# Patient Record
Sex: Female | Born: 1995 | Race: White | Hispanic: No | Marital: Single | Smoking: Never smoker
Health system: Southern US, Community
[De-identification: ages and names within clinical notes are randomized; demographics above are authoritative.]

## PROBLEM LIST (undated history)

## (undated) ENCOUNTER — Inpatient Hospital Stay: Payer: Self-pay

## (undated) DIAGNOSIS — D649 Anemia, unspecified: Secondary | ICD-10-CM

## (undated) DIAGNOSIS — O139 Gestational [pregnancy-induced] hypertension without significant proteinuria, unspecified trimester: Secondary | ICD-10-CM

## (undated) DIAGNOSIS — G43109 Migraine with aura, not intractable, without status migrainosus: Secondary | ICD-10-CM

## (undated) DIAGNOSIS — Z8742 Personal history of other diseases of the female genital tract: Secondary | ICD-10-CM

## (undated) DIAGNOSIS — F329 Major depressive disorder, single episode, unspecified: Secondary | ICD-10-CM

## (undated) DIAGNOSIS — D72829 Elevated white blood cell count, unspecified: Secondary | ICD-10-CM

## (undated) DIAGNOSIS — Z8489 Family history of other specified conditions: Secondary | ICD-10-CM

## (undated) DIAGNOSIS — F1210 Cannabis abuse, uncomplicated: Secondary | ICD-10-CM

## (undated) DIAGNOSIS — F32A Depression, unspecified: Secondary | ICD-10-CM

## (undated) DIAGNOSIS — L709 Acne, unspecified: Secondary | ICD-10-CM

## (undated) DIAGNOSIS — F909 Attention-deficit hyperactivity disorder, unspecified type: Secondary | ICD-10-CM

## (undated) DIAGNOSIS — G8929 Other chronic pain: Secondary | ICD-10-CM

## (undated) DIAGNOSIS — K219 Gastro-esophageal reflux disease without esophagitis: Secondary | ICD-10-CM

## (undated) DIAGNOSIS — R0600 Dyspnea, unspecified: Secondary | ICD-10-CM

## (undated) DIAGNOSIS — K589 Irritable bowel syndrome without diarrhea: Secondary | ICD-10-CM

## (undated) DIAGNOSIS — M549 Dorsalgia, unspecified: Secondary | ICD-10-CM

## (undated) DIAGNOSIS — F419 Anxiety disorder, unspecified: Secondary | ICD-10-CM

## (undated) DIAGNOSIS — R001 Bradycardia, unspecified: Secondary | ICD-10-CM

## (undated) HISTORY — PX: CHOLECYSTECTOMY: SHX55

## (undated) HISTORY — DX: Acne, unspecified: L70.9

## (undated) HISTORY — DX: Migraine with aura, not intractable, without status migrainosus: G43.109

## (undated) HISTORY — DX: Attention-deficit hyperactivity disorder, unspecified type: F90.9

## (undated) HISTORY — DX: Gastro-esophageal reflux disease without esophagitis: K21.9

## (undated) HISTORY — PX: INSERTION OF NON VAGINAL CONTRACEPTIVE DEVICE: SHX6253

## (undated) HISTORY — DX: Dorsalgia, unspecified: M54.9

## (undated) HISTORY — DX: Other chronic pain: G89.29

---
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
## 2015-03-15 ENCOUNTER — Encounter: Payer: Self-pay | Admitting: Family Medicine

## 2015-03-15 ENCOUNTER — Ambulatory Visit (INDEPENDENT_AMBULATORY_CARE_PROVIDER_SITE_OTHER): Payer: Medicaid Other | Admitting: Family Medicine

## 2015-03-15 VITALS — BP 124/72 | HR 110 | Temp 99.3°F | Resp 16 | Ht 63.75 in | Wt 135.5 lb

## 2015-03-15 DIAGNOSIS — G8929 Other chronic pain: Secondary | ICD-10-CM

## 2015-03-15 DIAGNOSIS — M545 Low back pain: Secondary | ICD-10-CM

## 2015-03-15 DIAGNOSIS — Z2821 Immunization not carried out because of patient refusal: Secondary | ICD-10-CM | POA: Diagnosis not present

## 2015-03-15 DIAGNOSIS — Z309 Encounter for contraceptive management, unspecified: Secondary | ICD-10-CM | POA: Diagnosis not present

## 2015-03-15 DIAGNOSIS — Z3046 Encounter for surveillance of implantable subdermal contraceptive: Secondary | ICD-10-CM

## 2015-03-16 DIAGNOSIS — G8929 Other chronic pain: Secondary | ICD-10-CM | POA: Insufficient documentation

## 2015-03-16 DIAGNOSIS — Z3046 Encounter for surveillance of implantable subdermal contraceptive: Secondary | ICD-10-CM | POA: Insufficient documentation

## 2015-03-16 DIAGNOSIS — Z2821 Immunization not carried out because of patient refusal: Secondary | ICD-10-CM | POA: Insufficient documentation

## 2015-03-16 DIAGNOSIS — M545 Low back pain: Principal | ICD-10-CM

## 2015-03-16 NOTE — Progress Notes (Signed)
Name: Marabella Popiel   MRN: [id number]    DOB: [birth year]   Date:03/16/2015       Progress Note  Subjective  Chief Complaint  Chief Complaint  Patient presents with  . Establish Care    patient has been having some issues sleeping.  . Knee Pain    patient wants to get a rx for Meloxicam that she was taking for chronic right knee pain (popped out of place right before her 7th grade year)    HPI  Jalayiah Bibian is a 19 year old female here today to establish care. She has finished 10 grade and stopped after that. She tried finishing requirements at [employer] but was not able to complete this.  She now plans to start working and knows that being on her feet all day will aggravate her back and knee pain. Back pain is usual between shoulder blades and lower back. No previous injury. Mobic has worked well in the past. For contraception she has a implanted device in left upper arm which is working well for her.   Patient Active Problem List   Diagnosis Date Noted  . Chronic lumbar pain 03/16/2015  . Implantable subdermal contraceptive surveillance 03/16/2015    Social History  Substance Use Topics  . Smoking status: Never Smoker   . Smokeless tobacco: Not on file  . Alcohol Use: No     Current outpatient prescriptions:  .  meloxicam (MOBIC) 7.5 MG tablet, Take 7.5 mg by mouth daily., Disp: , Rfl:   Past Surgical History  Procedure Laterality Date  . Insertion of non vaginal contraceptive device      History reviewed. No pertinent family history.  No Known Allergies   Review of Systems  CONSTITUTIONAL: No significant weight changes, fever, chills, weakness or fatigue.  HEENT:  - Eyes: No visual changes.  - Ears: No auditory changes. No pain.  - Nose: No sneezing, congestion, runny nose. - Throat: No sore throat. No changes in swallowing. SKIN: No rash or itching.  CARDIOVASCULAR: No chest pain, chest pressure or chest discomfort. No palpitations or edema.  RESPIRATORY:  No shortness of breath, cough or sputum.  GASTROINTESTINAL: No anorexia, nausea, vomiting. No changes in bowel habits. No abdominal pain or blood.  GENITOURINARY: No dysuria. No frequency. No discharge. NEUROLOGICAL: No headache, dizziness, syncope, paralysis, ataxia, numbness or tingling in the extremities. No memory changes. No change in bowel or bladder control.  MUSCULOSKELETAL: Yes joint pain. No muscle pain. HEMATOLOGIC: No anemia, bleeding or bruising.  LYMPHATICS: No enlarged lymph nodes.  PSYCHIATRIC: No change in mood. No change in sleep pattern.  ENDOCRINOLOGIC: No reports of sweating, cold or heat intolerance. No polyuria or polydipsia.     Objective  BP 124/72 mmHg  Pulse 110  Temp(Src) 99.3 F (37.4 C) (Oral)  Resp 16  Ht 5' 3.75" (1.619 m)  Wt 135 lb 8 oz (61.462 kg)  BMI 23.45 kg/m2  SpO2 97% Body mass index is 23.45 kg/(m^2).  Physical Exam  Constitutional: Patient appears well-developed and well-nourished. In no distress.  HEENT:  - Head: Normocephalic and atraumatic.  - Ears: Bilateral TMs gray, no erythema or effusion - Nose: Nasal mucosa moist - Mouth/Throat: Oropharynx is clear and moist. No tonsillar hypertrophy or erythema. No post nasal drainage.  - Eyes: Conjunctivae clear, EOM movements normal. PERRLA. No scleral icterus.  Neck: Normal range of motion. Neck supple. No JVD present. No thyromegaly present.  Cardiovascular: Normal rate, regular rhythm and normal heart sounds.  No murmur heard.  Pulmonary/Chest: Effort normal and breath sounds normal. No respiratory distress. Musculoskeletal: Normal range of motion bilateral UE and LE, no joint effusions. Cervical, thoracic and lumbar spine normal curvature with no palpable step off or point tenderness. Peripheral vascular: Bilateral LE no edema. Neurological: CN II-XII grossly intact with no focal deficits. Alert and oriented to person, place, and time. Coordination, balance, strength, speech and gait  are normal.  Skin: Skin is warm and dry. No rash noted. No erythema.  Psychiatric: Patient has a normal mood and affect. Behavior is normal in office today. Judgment and thought content normal in office today.   Assessment & Plan  1. Chronic lumbar pain We discussed potential pathology and long term risk of reoccurrence. Maintaining an ideal body habitus, regular exercise, proper lifting techniques and mindfulness of exacerbating factors will be useful in long term management.  Instructed on use of heating pad with exercises. Consider concomitant therapy with PT, massage therapist or chiropractor. May use anti-inflammatory medication and muscle relaxer as needed.   2. Implantable subdermal contraceptive surveillance   3. Influenza vaccination declined by patient

## 2015-03-25 ENCOUNTER — Emergency Department
Admission: EM | Admit: 2015-03-25 | Discharge: 2015-03-25 | Disposition: A | Discharge status: Home / Self Care | Payer: Medicaid Other | Attending: Emergency Medicine | Admitting: Emergency Medicine

## 2015-03-25 ENCOUNTER — Emergency Department: Payer: Medicaid Other

## 2015-03-25 ENCOUNTER — Encounter: Payer: Self-pay | Admitting: Emergency Medicine

## 2015-03-25 DIAGNOSIS — N10 Acute tubulo-interstitial nephritis: Secondary | ICD-10-CM | POA: Diagnosis not present

## 2015-03-25 DIAGNOSIS — R112 Nausea with vomiting, unspecified: Secondary | ICD-10-CM | POA: Diagnosis present

## 2015-03-25 DIAGNOSIS — R197 Diarrhea, unspecified: Secondary | ICD-10-CM | POA: Diagnosis not present

## 2015-03-25 DIAGNOSIS — R1084 Generalized abdominal pain: Secondary | ICD-10-CM | POA: Diagnosis not present

## 2015-03-25 DIAGNOSIS — R05 Cough: Secondary | ICD-10-CM | POA: Diagnosis not present

## 2015-03-25 DIAGNOSIS — Z3202 Encounter for pregnancy test, result negative: Secondary | ICD-10-CM | POA: Diagnosis not present

## 2015-03-25 DIAGNOSIS — N12 Tubulo-interstitial nephritis, not specified as acute or chronic: Secondary | ICD-10-CM

## 2015-03-25 LAB — URINALYSIS COMPLETE WITH MICROSCOPIC (ARMC ONLY)
Bilirubin Urine: NEGATIVE
Glucose, UA: NEGATIVE mg/dL
Hgb urine dipstick: NEGATIVE
Ketones, ur: NEGATIVE mg/dL
Leukocytes, UA: NEGATIVE
Nitrite: NEGATIVE
PH: 5 (ref 5.0–8.0)
PROTEIN: 30 mg/dL — AB
Specific Gravity, Urine: 1.02 (ref 1.005–1.030)

## 2015-03-25 LAB — CBC WITH DIFFERENTIAL/PLATELET
BASOS ABS: 0 10*3/uL (ref 0–0.1)
BASOS PCT: 0 %
Eosinophils Absolute: 0 10*3/uL (ref 0–0.7)
Eosinophils Relative: 0 %
HEMATOCRIT: 40.3 % (ref 35.0–47.0)
HEMOGLOBIN: 13.4 g/dL (ref 12.0–16.0)
LYMPHS PCT: 5 %
Lymphs Abs: 0.9 10*3/uL — ABNORMAL LOW (ref 1.0–3.6)
MCH: 27.5 pg (ref 26.0–34.0)
MCHC: 33.3 g/dL (ref 32.0–36.0)
MCV: 82.3 fL (ref 80.0–100.0)
MONO ABS: 0.6 10*3/uL (ref 0.2–0.9)
Monocytes Relative: 4 %
NEUTROS ABS: 16.9 10*3/uL — AB (ref 1.4–6.5)
NEUTROS PCT: 91 %
Platelets: 263 10*3/uL (ref 150–440)
RBC: 4.89 MIL/uL (ref 3.80–5.20)
RDW: 12.9 % (ref 11.5–14.5)
WBC: 18.5 10*3/uL — AB (ref 3.6–11.0)

## 2015-03-25 LAB — COMPREHENSIVE METABOLIC PANEL
ALBUMIN: 4.6 g/dL (ref 3.5–5.0)
ALT: 28 U/L (ref 14–54)
AST: 34 U/L (ref 15–41)
Alkaline Phosphatase: 80 U/L (ref 38–126)
Anion gap: 9 (ref 5–15)
BILIRUBIN TOTAL: 0.5 mg/dL (ref 0.3–1.2)
BUN: 10 mg/dL (ref 6–20)
CO2: 25 mmol/L (ref 22–32)
CREATININE: 0.7 mg/dL (ref 0.44–1.00)
Calcium: 9 mg/dL (ref 8.9–10.3)
Chloride: 109 mmol/L (ref 101–111)
GFR calc Af Amer: >60 mL/min (ref >60)
GFR calc non Af Amer: >60 mL/min (ref >60)
GLUCOSE: 123 mg/dL — AB (ref 65–99)
POTASSIUM: 3.7 mmol/L (ref 3.5–5.1)
Sodium: 143 mmol/L (ref 135–145)
TOTAL PROTEIN: 7.9 g/dL (ref 6.5–8.1)

## 2015-03-25 LAB — LIPASE, BLOOD: Lipase: 14 U/L — ABNORMAL LOW (ref 22–51)

## 2015-03-25 MED ORDER — ONDANSETRON HCL 4 MG PO TABS: 4.0000 mg | ORAL_TABLET | Freq: Every day | ORAL | Status: DC | PRN

## 2015-03-25 MED ORDER — MORPHINE SULFATE (PF) 4 MG/ML IV SOLN
4.0000 mg | Freq: Once | INTRAVENOUS | Status: AC
  Administered 2015-03-25: 4 mg via INTRAVENOUS
  Filled 2015-03-25: qty 1

## 2015-03-25 MED ORDER — SODIUM CHLORIDE 0.9 % IV BOLUS (SEPSIS)
1000.0000 mL | Freq: Once | INTRAVENOUS | Status: AC
Start: 2015-03-25 — End: 2015-03-25
  Administered 2015-03-25: 1000 mL via INTRAVENOUS

## 2015-03-25 MED ORDER — IOHEXOL 240 MG/ML SOLN
25.0000 mL | Freq: Once | INTRAMUSCULAR | Status: AC | PRN
  Administered 2015-03-25: 25 mL via ORAL

## 2015-03-25 MED ORDER — IOHEXOL 300 MG/ML  SOLN
100.0000 mL | Freq: Once | INTRAMUSCULAR | Status: AC | PRN
  Administered 2015-03-25: 100 mL via INTRAVENOUS

## 2015-03-25 MED ORDER — ONDANSETRON HCL 4 MG/2ML IJ SOLN
4.0000 mg | Freq: Once | INTRAMUSCULAR | Status: AC
  Administered 2015-03-25: 4 mg via INTRAVENOUS
  Filled 2015-03-25: qty 2

## 2015-03-25 MED ORDER — PANTOPRAZOLE SODIUM 40 MG IV SOLR
40.0000 mg | Freq: Once | INTRAVENOUS | Status: AC
  Administered 2015-03-25: 40 mg via INTRAVENOUS
  Filled 2015-03-25: qty 40

## 2015-03-25 MED ORDER — DICYCLOMINE HCL 20 MG PO TABS: 20.0000 mg | ORAL_TABLET | Freq: Three times a day (TID) | ORAL | Status: DC | PRN

## 2015-03-25 MED ORDER — CEPHALEXIN 500 MG PO CAPS
500.0000 mg | ORAL_CAPSULE | Freq: Three times a day (TID) | ORAL | Status: DC
Start: 2015-03-25 — End: 2015-03-30

## 2015-03-25 MED ORDER — DICYCLOMINE HCL 10 MG PO CAPS
20.0000 mg | ORAL_CAPSULE | Freq: Once | ORAL | Status: AC
  Administered 2015-03-25: 20 mg via ORAL
  Filled 2015-03-25: qty 2

## 2015-03-25 MED ORDER — CEPHALEXIN 500 MG PO CAPS
500.0000 mg | ORAL_CAPSULE | Freq: Once | ORAL | Status: AC
  Administered 2015-03-25: 500 mg via ORAL
  Filled 2015-03-25: qty 1

## 2015-03-25 NOTE — ED Notes (Signed)
Reports waking up at 5am with vomiting.

## 2015-03-25 NOTE — ED Provider Notes (Signed)
[facility] Emergency Department Provider Note  ____________________________________________  Time seen: Approximately 11 AM  I have reviewed the triage vital signs and the nursing notes.   HISTORY  Chief Complaint Emesis    HPI Tiffany Guzman is a 19 y.o. female who is presenting today with abdominal pain with nausea vomiting and diarrhea since 5 AM. She says that she recently had a cough with nasal congestion that she thinks she caught from family member. However, this morning at 5 AM she began having upper abdominal cramping which progressed to multiple episodes of vomiting. She thinks that she vomited over 20 times which eventually progressed to blood streaks in her vomit. She is not having upper abdominal cramping which is intermittent and severe. Also complaining of diarrhea without blood in her stool. She says the diarrhea has been going on for one month.Denies any recent antibiotics or travel. Says has a history of urinary tract infections but this does not feel like her UTIs. Denies any vaginal bleeding or discharge. No dysuria.   Past Medical History  Diagnosis Date  . Chronic upper back pain   . Attention deficit hyperactivity disorder (ADHD)     Patient Active Problem List   Diagnosis Date Noted  . Chronic lumbar pain 03/16/2015  . Implantable subdermal contraceptive surveillance 03/16/2015  . Influenza vaccination declined by patient 03/16/2015    Past Surgical History  Procedure Laterality Date  . Insertion of non vaginal contraceptive device      Current Outpatient Rx  Name  Route  Sig  Dispense  Refill  . meloxicam (MOBIC) 7.5 MG tablet   Oral   Take 7.5 mg by mouth daily as needed (for inflammation.).            Allergies Review of patient's allergies indicates no known allergies.  History reviewed. No pertinent family history.  Social History Social History  Substance Use Topics  . Smoking status: Never Smoker   .  Smokeless tobacco: None  . Alcohol Use: No    Review of Systems Constitutional: No fever/chills Eyes: No visual changes. ENT: No sore throat. Cardiovascular: Denies chest pain. Respiratory: Denies shortness of breath. Gastrointestinal: No constipation. Genitourinary: Negative for dysuria. Musculoskeletal: Negative for back pain. Skin: Negative for rash. Neurological: Negative for headaches, focal weakness or numbness.  10-point ROS otherwise negative.  ____________________________________________   PHYSICAL EXAM:  VITAL SIGNS: ED Triage Vitals  Enc Vitals Group     BP 03/25/15 1035 124/78 mmHg     Pulse Rate 03/25/15 1035 93     Resp 03/25/15 1035 18     Temp 03/25/15 1035 97.5 F (36.4 C)     Temp Source 03/25/15 1035 Oral     SpO2 03/25/15 1035 100 %     Weight 03/25/15 1035 138 lb (62.596 kg)     Height 03/25/15 1035 5\' 4"  (1.626 m)     Head Cir --      Peak Flow --      Pain Score 03/25/15 1039 10     Pain Loc --      Pain Edu? --      Excl. in [place]? --     Constitutional: Alert and oriented. Well appearing and in no acute distress. Eyes: Conjunctivae are normal. PERRL. EOMI. Head: Atraumatic. Nose: No congestion/rhinnorhea. Mouth/Throat: Mucous membranes are moist.  Oropharynx non-erythematous. Neck: No stridor.   Cardiovascular: Normal rate, regular rhythm. Grossly normal heart sounds.  Good peripheral circulation. Respiratory: Normal respiratory effort.  No retractions. Lungs CTAB. Gastrointestinal: Soft with tenderness to the upper abdomen as well as suprapubic. There is no rebound or guarding. There is a negative Murphy sign. There is no tenderness over McBurney's point.. No distention. No abdominal bruits. Bilateral CVA tenderness. Musculoskeletal: No lower extremity tenderness nor edema.  No joint effusions. Neurologic:  Normal speech and language. No gross focal neurologic deficits are appreciated. No gait instability. Skin:  Skin is warm, dry and  intact. No rash noted. Psychiatric: Mood and affect are normal. Speech and behavior are normal.  ____________________________________________   LABS (all labs ordered are listed, but only abnormal results are displayed)  Labs Reviewed  CBC WITH DIFFERENTIAL/PLATELET - Abnormal; Notable for the following:    WBC 18.5 (*)    Neutro Abs 16.9 (*)    Lymphs Abs 0.9 (*)    All other components within normal limits  COMPREHENSIVE METABOLIC PANEL - Abnormal; Notable for the following:    Glucose, Bld 123 (*)    All other components within normal limits  URINALYSIS COMPLETEWITH MICROSCOPIC ([facility] ONLY) - Abnormal; Notable for the following:    Color, Urine YELLOW (*)    APPearance TURBID (*)    Protein, ur 30 (*)    Bacteria, UA FEW (*)    Squamous Epithelial / LPF 6-30 (*)    All other components within normal limits  LIPASE, BLOOD - Abnormal; Notable for the following:    Lipase 14 (*)    All other components within normal limits  URINE CULTURE  POC URINE PREG, ED   ____________________________________________  EKG   ____________________________________________  RADIOLOGY  No acute finding on the CAT scan of the abdomen and pelvis. ____________________________________________   PROCEDURES   ____________________________________________   INITIAL IMPRESSION / ASSESSMENT AND PLAN / ED COURSE  Pertinent labs & imaging results that were available during my care of the patient were reviewed by me and considered in my medical decision making (see chart for details).  ----------------------------------------- 2:43 PM on 03/25/2015 -----------------------------------------  Patient reassessed and is feeling much better at this time. Pain is reduced after medication and patient tolerated by mouth contrast. Patient with urinary tract infection and has had UTIs in the past. Patient is not concerned about sexually transmitted diseases. No history of sexually transmitted  diseases. Discussed pelvic exam and patient would not like this at this time. We will prescribe antibiotics and follow-up with primary care. ____________________________________________   FINAL CLINICAL IMPRESSION(S) / ED DIAGNOSES  Acute pyelonephritis. Initial visit.    Orbie Pyo, MD 03/25/15 1500

## 2015-03-25 NOTE — Discharge Instructions (Signed)
Abdominal Pain, Women °Abdominal (stomach, pelvic, or belly) pain can be caused by many things. It is important to tell your doctor: °· The location of the pain. °· Does it come and go or is it present all the time? °· Are there things that start the pain (eating certain foods, exercise)? °· Are there other symptoms associated with the pain (fever, nausea, vomiting, diarrhea)? °All of this is helpful to know when trying to find the cause of the pain. °CAUSES  °· Stomach: virus or bacteria infection, or ulcer. °· Intestine: appendicitis (inflamed appendix), regional ileitis (Crohn's disease), ulcerative colitis (inflamed colon), irritable bowel syndrome, diverticulitis (inflamed diverticulum of the colon), or cancer of the stomach or intestine. °· Gallbladder disease or stones in the gallbladder. °· Kidney disease, kidney stones, or infection. °· Pancreas infection or cancer. °· Fibromyalgia (pain disorder). °· Diseases of the female organs: °¨ Uterus: fibroid (non-cancerous) tumors or infection. °¨ Fallopian tubes: infection or tubal pregnancy. °¨ Ovary: cysts or tumors. °¨ Pelvic adhesions (scar tissue). °¨ Endometriosis (uterus lining tissue growing in the pelvis and on the pelvic organs). °¨ Pelvic congestion syndrome (female organs filling up with blood just before the menstrual period). °¨ Pain with the menstrual period. °¨ Pain with ovulation (producing an egg). °¨ Pain with an IUD (intrauterine device, birth control) in the uterus. °¨ Cancer of the female organs. °· Functional pain (pain not caused by a disease, may improve without treatment). °· Psychological pain. °· Depression. °DIAGNOSIS  °Your doctor will decide the seriousness of your pain by doing an examination. °· Blood tests. °· X-rays. °· Ultrasound. °· CT scan (computed tomography, special type of X-ray). °· MRI (magnetic resonance imaging). °· Cultures, for infection. °· Barium enema (dye inserted in the large intestine, to better view it with  X-rays). °· Colonoscopy (looking in intestine with a lighted tube). °· Laparoscopy (minor surgery, looking in abdomen with a lighted tube). °· Major abdominal exploratory surgery (looking in abdomen with a large incision). °TREATMENT  °The treatment will depend on the cause of the pain.  °· Many cases can be observed and treated at home. °· Over-the-counter medicines recommended by your caregiver. °· Prescription medicine. °· Antibiotics, for infection. °· Birth control pills, for painful periods or for ovulation pain. °· Hormone treatment, for endometriosis. °· Nerve blocking injections. °· Physical therapy. °· Antidepressants. °· Counseling with a psychologist or psychiatrist. °· Minor or major surgery. °HOME CARE INSTRUCTIONS  °· Do not take laxatives, unless directed by your caregiver. °· Take over-the-counter pain medicine only if ordered by your caregiver. Do not take aspirin because it can cause an upset stomach or bleeding. °· Try a clear liquid diet (broth or water) as ordered by your caregiver. Slowly move to a bland diet, as tolerated, if the pain is related to the stomach or intestine. °· Have a thermometer and take your temperature several times a day, and record it. °· Bed rest and sleep, if it helps the pain. °· Avoid sexual intercourse, if it causes pain. °· Avoid stressful situations. °· Keep your follow-up appointments and tests, as your caregiver orders. °· If the pain does not go away with medicine or surgery, you may try: °¨ Acupuncture. °¨ Relaxation exercises (yoga, meditation). °¨ Group therapy. °¨ Counseling. °SEEK MEDICAL CARE IF:  °· You notice certain foods cause stomach pain. °· Your home care treatment is not helping your pain. °· You need stronger pain medicine. °· You want your IUD removed. °· You feel faint or   lightheaded. °· You develop nausea and vomiting. °· You develop a rash. °· You are having side effects or an allergy to your medicine. °SEEK IMMEDIATE MEDICAL CARE IF:  °· Your  pain does not go away or gets worse. °· You have a fever. °· Your pain is felt only in portions of the abdomen. The right side could possibly be appendicitis. The left lower portion of the abdomen could be colitis or diverticulitis. °· You are passing blood in your stools (bright red or black tarry stools, with or without vomiting). °· You have blood in your urine. °· You develop chills, with or without a fever. °· You pass out. °MAKE SURE YOU:  °· Understand these instructions. °· Will watch your condition. °· Will get help right away if you are not doing well or get worse. °Document Released: 05/05/2007 Document Revised: 11/22/2013 Document Reviewed: 05/25/2009 °[employer]® Patient Information ©2015 [employer]. This information is not intended to replace advice given to you by your health care provider. Make sure you discuss any questions you have with your health care provider. ° °

## 2015-03-26 ENCOUNTER — Emergency Department
Admission: EM | Admit: 2015-03-26 | Discharge: 2015-03-26 | Disposition: A | Discharge status: Home / Self Care | Payer: Medicaid Other | Attending: Emergency Medicine | Admitting: Emergency Medicine

## 2015-03-26 ENCOUNTER — Encounter: Payer: Self-pay | Admitting: Emergency Medicine

## 2015-03-26 DIAGNOSIS — M545 Low back pain: Secondary | ICD-10-CM | POA: Insufficient documentation

## 2015-03-26 DIAGNOSIS — Z792 Long term (current) use of antibiotics: Secondary | ICD-10-CM | POA: Insufficient documentation

## 2015-03-26 DIAGNOSIS — G8929 Other chronic pain: Secondary | ICD-10-CM | POA: Insufficient documentation

## 2015-03-26 DIAGNOSIS — R112 Nausea with vomiting, unspecified: Secondary | ICD-10-CM | POA: Diagnosis not present

## 2015-03-26 DIAGNOSIS — N10 Acute tubulo-interstitial nephritis: Secondary | ICD-10-CM | POA: Diagnosis not present

## 2015-03-26 DIAGNOSIS — R109 Unspecified abdominal pain: Secondary | ICD-10-CM | POA: Diagnosis present

## 2015-03-26 DIAGNOSIS — M546 Pain in thoracic spine: Secondary | ICD-10-CM | POA: Insufficient documentation

## 2015-03-26 LAB — CBC
HCT: 41 % (ref 35.0–47.0)
Hemoglobin: 13.8 g/dL (ref 12.0–16.0)
MCH: 27.6 pg (ref 26.0–34.0)
MCHC: 33.5 g/dL (ref 32.0–36.0)
MCV: 82.3 fL (ref 80.0–100.0)
PLATELETS: 273 10*3/uL (ref 150–440)
RBC: 4.99 MIL/uL (ref 3.80–5.20)
RDW: 13.1 % (ref 11.5–14.5)
WBC: 11.2 10*3/uL — ABNORMAL HIGH (ref 3.6–11.0)

## 2015-03-26 LAB — COMPREHENSIVE METABOLIC PANEL
ALBUMIN: 4.5 g/dL (ref 3.5–5.0)
ALT: 25 U/L (ref 14–54)
AST: 33 U/L (ref 15–41)
Alkaline Phosphatase: 84 U/L (ref 38–126)
Anion gap: 8 (ref 5–15)
BUN: 11 mg/dL (ref 6–20)
CHLORIDE: 108 mmol/L (ref 101–111)
CO2: 25 mmol/L (ref 22–32)
CREATININE: 0.78 mg/dL (ref 0.44–1.00)
Calcium: 9.4 mg/dL (ref 8.9–10.3)
GFR calc non Af Amer: >60 mL/min (ref >60)
Glucose, Bld: 95 mg/dL (ref 65–99)
Potassium: 3.5 mmol/L (ref 3.5–5.1)
SODIUM: 141 mmol/L (ref 135–145)
Total Bilirubin: 0.6 mg/dL (ref 0.3–1.2)
Total Protein: 8 g/dL (ref 6.5–8.1)

## 2015-03-26 LAB — POCT PREGNANCY, URINE: Preg Test, Ur: NEGATIVE

## 2015-03-26 LAB — LIPASE, BLOOD: LIPASE: 17 U/L — AB (ref 22–51)

## 2015-03-26 MED ORDER — ONDANSETRON HCL 4 MG/2ML IJ SOLN
4.0000 mg | Freq: Once | INTRAMUSCULAR | Status: AC
  Administered 2015-03-26: 4 mg via INTRAVENOUS
  Filled 2015-03-26: qty 2

## 2015-03-26 NOTE — ED Provider Notes (Signed)
[facility] Emergency Department Provider Note  ____________________________________________  Time seen: Approximately 8:12 PM  I have reviewed the triage vital signs and the nursing notes.   HISTORY  Chief Complaint Abdominal Pain    HPI Tiffany Guzman is a 19 y.o. female with a history of chronic upper back pain, chronic lumbar pain, and a visit yesterday to the emergency department for abdominal pain with an/V/D.  She had an extensive workup and had a leukocytosis of greater than 18and a CT scan with no acute findings.  She also had a urinalysis that showed too numerous to count white blood cells.  She was diagnosed with pyelonephritis and started on antibiotics.  Of note, the patient refused a pelvic exam yesterday and it is not at all concerned about sexual transmitted infections.  She was able to take a dose of her antibiotics but then when she took another dose earlier today she became ill and vomited multiple times in spite of taking her Zofran.  She also reports persistent severe and intermittent pain in the epigastrium and in her flanks.  She describes as severe, acute in onset, but now resolved.  She denies chest pain, shortness of breath.     Past Medical History  Diagnosis Date  . Chronic upper back pain   . Attention deficit hyperactivity disorder (ADHD)     Patient Active Problem List   Diagnosis Date Noted  . Chronic lumbar pain 03/16/2015  . Implantable subdermal contraceptive surveillance 03/16/2015  . Influenza vaccination declined by patient 03/16/2015    Past Surgical History  Procedure Laterality Date  . Insertion of non vaginal contraceptive device      Current Outpatient Rx  Name  Route  Sig  Dispense  Refill  . cephALEXin (KEFLEX) 500 MG capsule   Oral   Take 1 capsule (500 mg total) by mouth 3 (three) times daily.   42 capsule   0   . dicyclomine (BENTYL) 20 MG tablet   Oral   Take 1 tablet (20 mg total) by  mouth 3 (three) times daily as needed for spasms.   30 tablet   0   . meloxicam (MOBIC) 7.5 MG tablet   Oral   Take 7.5 mg by mouth daily as needed (for inflammation.).          Marland Kitchen ondansetron (ZOFRAN) 4 MG tablet   Oral   Take 1 tablet (4 mg total) by mouth daily as needed for nausea or vomiting.   10 tablet   1     Allergies Review of patient's allergies indicates no known allergies.  No family history on file.  Social History Social History  Substance Use Topics  . Smoking status: Never Smoker   . Smokeless tobacco: None  . Alcohol Use: No    Review of Systems Constitutional: Subjective fever Eyes: No visual changes. ENT: No sore throat. Cardiovascular: Denies chest pain. Respiratory: Denies shortness of breath. Gastrointestinal: Epigastric pain with nausea and vomiting.  No diarrhea.  No constipation. Genitourinary: Negative for dysuria. Musculoskeletal: Bilateral flank pain Skin: Negative for rash. Neurological: Negative for headaches, focal weakness or numbness.  10-point ROS otherwise negative.  ____________________________________________   PHYSICAL EXAM:  VITAL SIGNS: ED Triage Vitals  Enc Vitals Group     BP 03/26/15 1633 120/88 mmHg     Pulse Rate 03/26/15 1633 78     Resp 03/26/15 1633 16     Temp 03/26/15 1633 97.9 F (36.6 C)  Temp Source 03/26/15 1633 Oral     SpO2 03/26/15 1633 98 %     Weight 03/26/15 1633 138 lb (62.596 kg)     Height 03/26/15 1633 5\' 4"  (1.626 m)     Head Cir --      Peak Flow --      Pain Score 03/26/15 1633 9     Pain Loc --      Pain Edu? --      Excl. in [place]? --     Constitutional: Alert and oriented. Well appearing and in no acute distress. Snuggling with her boyfriend in the exam bed.  Her mother is also present. Eyes: Conjunctivae are normal. PERRL. EOMI. Head: Atraumatic. Nose: No congestion/rhinnorhea. Mouth/Throat: Mucous membranes are moist.  Oropharynx non-erythematous. Neck: No stridor.    Cardiovascular: Normal rate, regular rhythm. Grossly normal heart sounds.  Good peripheral circulation. Respiratory: Normal respiratory effort.  No retractions. Lungs CTAB. Gastrointestinal: Soft and nontender. No distention. No abdominal bruits.  Mild bilateral CVA tenderness. Musculoskeletal: No lower extremity tenderness nor edema.  No joint effusions. Neurologic:  Normal speech and language. No gross focal neurologic deficits are appreciated.  Skin:  Skin is warm, dry and intact. No rash noted. Psychiatric: Mood and affect are normal. Speech and behavior are normal.  ____________________________________________   LABS (all labs ordered are listed, but only abnormal results are displayed)  Labs Reviewed  LIPASE, BLOOD - Abnormal; Notable for the following:    Lipase 17 (*)    All other components within normal limits  CBC - Abnormal; Notable for the following:    WBC 11.2 (*)    All other components within normal limits  COMPREHENSIVE METABOLIC PANEL   ____________________________________________  EKG  Not indicated ____________________________________________  RADIOLOGY  (CT FROM YESTERDAY) Ct Abdomen Pelvis W Contrast  03/25/2015   CLINICAL DATA:  Pain, nausea, vomiting, diarrhea.  No trauma.  EXAM: CT ABDOMEN AND PELVIS WITH CONTRAST  TECHNIQUE: Multidetector CT imaging of the abdomen and pelvis was performed using the standard protocol following bolus administration of intravenous contrast.  CONTRAST:  185mL OMNIPAQUE IOHEXOL 300 MG/ML  SOLN  COMPARISON:  None.  FINDINGS: Lower chest:  Clear lung bases.  Normal heart size.  Hepatobiliary: Normal liver.  Normal gallbladder.  Pancreas: Normal.  Spleen: Normal.  Adrenals/Urinary Tract: Normal adrenal glands. Normal kidneys. Normal decompressed bladder. No urolithiasis or obstructive uropathy.  Stomach/Bowel: No bowel dilatation to suggest obstruction. No bowel wall thickening. Normal appendix without periappendiceal inflammatory  changes. No pneumoperitoneum, pneumatosis or portal venous gas. No abdominal or pelvic free fluid.  Vascular/Lymphatic: Normal caliber abdominal aorta. No abdominal or pelvic lymphadenopathy.  Reproductive: Normal uterus.  No adnexal mass.  Other: No fluid collection or hematoma.  Musculoskeletal: No acute osseous abnormality. No lytic or sclerotic osseous lesion.  IMPRESSION: 1. No acute abdominal or pelvic pathology.   Electronically Signed   By: Kathreen Devoid   On: 03/25/2015 13:42     ____________________________________________   PROCEDURES  Procedure(s) performed: None  Critical Care performed: No ____________________________________________   INITIAL IMPRESSION / ASSESSMENT AND PLAN / ED COURSE  Pertinent labs & imaging results that were available during my care of the patient were reviewed by me and considered in my medical decision making (see chart for details).  The patient has a leukocytosis but it is improved since yesterday.  Her labs are unremarkable and she is afebrile with normal vital signs.  She is in no acute distress at this time.  She has a reassuring physical exam.  I counseled her and her family about taking alternating doses of ibuprofen and Tylenol.  I stressed the importance of taking her antibiotics regularly.  I encouraged her to take 2 Zofran if needed about 15-30 minutes prior to taking her antibiotics.  We will give her a dose of Zofran 4 mg IV and then have her take a by mouth challenge and take a regular dose of her own Keflex.  I provided reassurance and encouraged outpatient follow-up.  The patient and the family agree with this plan.  ____________________________________________  FINAL CLINICAL IMPRESSION(S) / ED DIAGNOSES  Final diagnoses:  Pyelonephritis, acute  Non-intractable vomiting with nausea, vomiting of unspecified type      NEW MEDICATIONS STARTED DURING THIS VISIT:  Discharge Medication List as of 03/26/2015  8:38 PM       Hinda Kehr, MD 03/26/15 2314

## 2015-03-26 NOTE — ED Notes (Signed)
Pt. Going home with family. 

## 2015-03-26 NOTE — Discharge Instructions (Signed)
Your workup today suggests that you have a urinary tract infection (UTI) which has spread to your kidneys.  As we discussed, although you still have symptoms, we believe you are improving.  Remember that you can take 2 of the Zofran (ondansetron) medication which is for nausea about 15-30 minutes before you take your regular medications.  We also encouraged to eat something bland such as ginger ale and saltine crackers so that it is coating her stomach before you take the medication.  Please take your antibiotic as prescribed and over-the-counter pain medication (Tylenol or Motrin) as needed, but no more than recommended on the label instructions.  Drink PLENTY of fluids.  Call your regular doctor to schedule the next available appointment to follow up on todays ED visit, or return immediately to the ED if your pain worsens, you have decreased urine production, develop fever, persistent vomiting, or other symptoms that concern you.   Pyelonephritis, Adult Pyelonephritis is a kidney infection. In general, there are 2 main types of pyelonephritis:  Infections that come on quickly without any warning (acute pyelonephritis).  Infections that persist for a long period of time (chronic pyelonephritis). CAUSES  Two main causes of pyelonephritis are:  Bacteria traveling from the bladder to the kidney. This is a problem especially in pregnant women. The urine in the bladder can become filled with bacteria from multiple causes, including:  Inflammation of the prostate gland (prostatitis).  Sexual intercourse in females.  Bladder infection (cystitis).  Bacteria traveling from the bloodstream to the tissue part of the kidney. Problems that may increase your risk of getting a kidney infection include:  Diabetes.  Kidney stones or bladder stones.  Cancer.  Catheters placed in the bladder.  Other abnormalities of the kidney or ureter. SYMPTOMS   Abdominal pain.  Pain in the side or flank  area.  Fever.  Chills.  Upset stomach.  Blood in the urine (dark urine).  Frequent urination.  Strong or persistent urge to urinate.  Burning or stinging when urinating. DIAGNOSIS  Your caregiver may diagnose your kidney infection based on your symptoms. A urine sample may also be taken. TREATMENT  In general, treatment depends on how severe the infection is.   If the infection is mild and caught early, your caregiver may treat you with oral antibiotics and send you home.  If the infection is more severe, the bacteria may have gotten into the bloodstream. This will require intravenous (IV) antibiotics and a hospital stay. Symptoms may include:  High fever.  Severe flank pain.  Shaking chills.  Even after a hospital stay, your caregiver may require you to be on oral antibiotics for a period of time.  Other treatments may be required depending upon the cause of the infection. HOME CARE INSTRUCTIONS   Take your antibiotics as directed. Finish them even if you start to feel better.  Make an appointment to have your urine checked to make sure the infection is gone.  Drink enough fluids to keep your urine clear or pale yellow.  Take medicines for the bladder if you have urgency and frequency of urination as directed by your caregiver. SEEK IMMEDIATE MEDICAL CARE IF:   You have a fever or persistent symptoms for more than 2-3 days.  You have a fever and your symptoms suddenly get worse.  You are unable to take your antibiotics or fluids.  You develop shaking chills.  You experience extreme weakness or fainting.  There is no improvement after 2 days of treatment.  MAKE SURE YOU:  Understand these instructions.  Will watch your condition.  Will get help right away if you are not doing well or get worse. Document Released: 07/08/2005 Document Revised: 01/07/2012 Document Reviewed: 12/12/2010 [facility] Patient Information 2015 [place], [place]. This information is not  intended to replace advice given to you by your health care provider. Make sure you discuss any questions you have with your health care provider.  Nausea and Vomiting Nausea means you feel sick to your stomach. Throwing up (vomiting) is a reflex where stomach contents come out of your mouth. HOME CARE   Take medicine as told by your doctor.  Do not force yourself to eat. However, you do need to drink fluids.  If you feel like eating, eat a normal diet as told by your doctor.  Eat rice, wheat, potatoes, bread, lean meats, yogurt, fruits, and vegetables.  Avoid high-fat foods.  Drink enough fluids to keep your pee (urine) clear or pale yellow.  Ask your doctor how to replace body fluid losses (rehydrate). Signs of body fluid loss (dehydration) include:  Feeling very thirsty.  Dry lips and mouth.  Feeling dizzy.  Dark pee.  Peeing less than normal.  Feeling confused.  Fast breathing or heart rate. GET HELP RIGHT AWAY IF:   You have blood in your throw up.  You have black or bloody poop (stool).  You have a bad headache or stiff neck.  You feel confused.  You have bad belly (abdominal) pain.  You have chest pain or trouble breathing.  You do not pee at least once every 8 hours.  You have cold, clammy skin.  You keep throwing up after 24 to 48 hours.  You have a fever. MAKE SURE YOU:   Understand these instructions.  Will watch your condition.  Will get help right away if you are not doing well or get worse. Document Released: 12/25/2007 Document Revised: 09/30/2011 Document Reviewed: 12/07/2010 [facility] Patient Information 2015 [place], [place]. This information is not intended to replace advice given to you by your health care provider. Make sure you discuss any questions you have with your health care provider.

## 2015-03-26 NOTE — ED Notes (Signed)
Pt states she came in to ED yesterday and was diagnosed with pylonephritis. She was prescribed keflex, zofran, and dicyclomine which she got from pharmacy this morning. States she has had one dose of each this morning and started vomiting in the afternoon so she hasn't taken any more.

## 2015-03-26 NOTE — ED Notes (Signed)
Pt seen here yesterday for abdominal pain; d/c with bentyl, keflex and zofran; reports dc with pylonephritis. Pt reports continued vomiting today, reports more intense epigastric/upper abdominal pain today. Pt reports pain is so strong she feels like she's going to pass out.

## 2015-03-27 LAB — URINE CULTURE: Culture: 4000

## 2015-03-29 ENCOUNTER — Telehealth: Payer: Self-pay | Admitting: Family Medicine

## 2015-03-29 NOTE — Telephone Encounter (Signed)
Pt is requesting something to help with her nausea because she is completely out of what was prescribed to her at the ER.  She went to the ER on 03/25/15 and was diagnosised with pylonephritis and was given cephalexin, dicyclomine, and ondansetron.  Pt is also scheduled to see Dr. Nadine Counts on 03/30/15 @ 1:30pm.

## 2015-03-30 ENCOUNTER — Ambulatory Visit (INDEPENDENT_AMBULATORY_CARE_PROVIDER_SITE_OTHER): Payer: Medicaid Other | Admitting: Family Medicine

## 2015-03-30 ENCOUNTER — Encounter: Payer: Self-pay | Admitting: Family Medicine

## 2015-03-30 VITALS — BP 120/68 | HR 88 | Temp 99.2°F | Resp 16 | Wt 133.2 lb

## 2015-03-30 DIAGNOSIS — K529 Noninfective gastroenteritis and colitis, unspecified: Secondary | ICD-10-CM | POA: Diagnosis not present

## 2015-03-30 DIAGNOSIS — R1084 Generalized abdominal pain: Secondary | ICD-10-CM

## 2015-03-30 DIAGNOSIS — G8929 Other chronic pain: Secondary | ICD-10-CM | POA: Diagnosis not present

## 2015-03-30 LAB — POCT URINALYSIS DIPSTICK
BILIRUBIN UA: NEGATIVE
GLUCOSE UA: NEGATIVE
KETONES UA: NEGATIVE
Nitrite, UA: NEGATIVE
PH UA: 7.5
Protein, UA: NEGATIVE
Spec Grav, UA: 1.015
Urobilinogen, UA: 0.2

## 2015-03-30 MED ORDER — ONDANSETRON 8 MG PO TBDP: 8.0000 mg | ORAL_TABLET | Freq: Three times a day (TID) | ORAL | Status: DC | PRN

## 2015-03-30 MED ORDER — DICYCLOMINE HCL 10 MG PO CAPS: 10.0000 mg | ORAL_CAPSULE | Freq: Three times a day (TID) | ORAL | Status: DC

## 2015-03-30 MED ORDER — OMEPRAZOLE 40 MG PO CPDR: 40.0000 mg | DELAYED_RELEASE_CAPSULE | Freq: Every day | ORAL | Status: DC

## 2015-03-30 MED ORDER — IBUPROFEN 800 MG PO TABS: 800.0000 mg | ORAL_TABLET | Freq: Three times a day (TID) | ORAL | Status: DC | PRN

## 2015-03-30 NOTE — Progress Notes (Addendum)
Name: Tiffany Guzman   MRN: [id number]    DOB: [birth year]   Date:03/30/2015       Progress Note  Subjective  Chief Complaint  Chief Complaint  Patient presents with  . Follow-up     patient was recently seen at the ER for possible UTI. patient was treated with Keflex, Bentyl and Zofran. patient has had a history of recurrent UTI. patient sx is about the same.    HPI  Tiffany Guzman is a 19 year old female here today for ER follow up. She was experiencing nausea, vomiting, abdominal pain and diarrhea over the past weekend and was seen in the ER and diagnosed with possible UTI and given antibiotics. On review of ER findings, her urine culture did not grew significant bacteria and her CT abdomen and pelvis was unremarkable. Her WBC count was elevated at the time. Has had recurrent UTIs in the past.  Patient complains of abdominal pain. The pain is described as aching, colicky, sharp and stabbing, and is 10/10 in intensity. Pain is located in the diffusely without radiation. Onset was 1 week ago. Symptoms have been unchanged since. Aggravating factors: activity.  Alleviating factors: NSAIDs and recumbency. The bentyl may be causing her to be constipated now. The antibiotics did not help much. Associated symptoms: belching, constipation, diarrhea and nausea. The patient denies headache, hematochezia, hematuria, melena, myalgias and sweats.  She has chronic back pain is usual between shoulder blades and lower back. CT scan abd/pelvis in ER did not note any spinal abnormalities. Her pain is likely muscular. No previous injury. Mobic has worked well in the past. For contraception she has a implanted device in left upper arm which is working well for her.    Patient Active Problem List   Diagnosis Date Noted  . Chronic lumbar pain 03/16/2015  . Implantable subdermal contraceptive surveillance 03/16/2015  . Influenza vaccination declined by patient 03/16/2015    Social History  Substance Use  Topics  . Smoking status: Never Smoker   . Smokeless tobacco: Not on file  . Alcohol Use: No     Current outpatient prescriptions:  .  cephALEXin (KEFLEX) 500 MG capsule, Take 1 capsule (500 mg total) by mouth 3 (three) times daily., Disp: 42 capsule, Rfl: 0 .  dicyclomine (BENTYL) 20 MG tablet, Take 1 tablet (20 mg total) by mouth 3 (three) times daily as needed for spasms., Disp: 30 tablet, Rfl: 0 .  meloxicam (MOBIC) 7.5 MG tablet, Take 7.5 mg by mouth daily as needed (for inflammation.). , Disp: , Rfl:  .  ondansetron (ZOFRAN) 4 MG tablet, Take 1 tablet (4 mg total) by mouth daily as needed for nausea or vomiting., Disp: 10 tablet, Rfl: 1  Past Surgical History  Procedure Laterality Date  . Insertion of non vaginal contraceptive device      History reviewed. No pertinent family history.  No Known Allergies   Review of Systems  CONSTITUTIONAL: No significant weight changes, fever, chills, weakness or fatigue.  HEENT:  - Eyes: No visual changes.  - Ears: No auditory changes. No pain.  - Nose: No sneezing, congestion, runny nose. - Throat: No sore throat. No changes in swallowing. SKIN: No rash or itching.  CARDIOVASCULAR: No chest pain, chest pressure or chest discomfort. No palpitations or edema.  RESPIRATORY: No shortness of breath, cough or sputum.  GASTROINTESTINAL: Yes anorexia, nausea, vomiting. Yes changes in bowel habits. Yes abdominal pain. GENITOURINARY: No dysuria. No frequency. No discharge.  NEUROLOGICAL: No headache, dizziness,  syncope, paralysis, ataxia, numbness or tingling in the extremities. No memory changes. No change in bowel or bladder control.  MUSCULOSKELETAL: No joint pain. No muscle pain. HEMATOLOGIC: No anemia, bleeding or bruising.  LYMPHATICS: No enlarged lymph nodes.  PSYCHIATRIC: No change in mood. No change in sleep pattern.  ENDOCRINOLOGIC: No reports of sweating, cold or heat intolerance. No polyuria or polydipsia.     Objective  BP  120/68 mmHg  Pulse 88  Temp(Src) 99.2 F (37.3 C) (Oral)  Resp 16  Wt 133 lb 3.2 oz (60.419 kg)  SpO2 98% Body mass index is 22.85 kg/(m^2).  Physical Exam  Constitutional: Patient appears well-developed and well-nourished. In no distress.  HEENT:  - Head: Normocephalic and atraumatic.  - Ears: Bilateral TMs gray, no erythema or effusion - Nose: Nasal mucosa moist - Mouth/Throat: Oropharynx is clear and moist. No tonsillar hypertrophy or erythema. No post nasal drainage.  - Eyes: Conjunctivae clear, EOM movements normal. PERRLA. No scleral icterus.  Neck: Normal range of motion. Neck supple. No JVD present. No thyromegaly present.  Cardiovascular: Normal rate, regular rhythm and normal heart sounds.  No murmur heard.  Pulmonary/Chest: Effort normal and breath sounds normal. No respiratory distress. Abdomen: Soft, non distended, normal bowel sounds in all 4 quadrants, generalized tenderness more prominent in lower quadrants (pelvic) and epigastric) Musculoskeletal: Normal range of motion bilateral UE and LE, no joint effusions. Peripheral vascular: Bilateral LE no edema. Neurological: CN II-XII grossly intact with no focal deficits. Alert and oriented to person, place, and time. Coordination, balance, strength, speech and gait are normal.  Skin: Skin is warm and dry. No rash noted. No erythema.  Psychiatric: Patient has an anxious mood and affect. Behavior is normal in office today. Judgment and thought content normal in office today.   Recent Results (from the past 2160 hour(s))  CBC with Differential     Status: Abnormal   Collection Time: 03/25/15 11:53 AM  Result Value Ref Range   WBC 18.5 (H) 3.6 - 11.0 K/uL   RBC 4.89 3.80 - 5.20 MIL/uL   Hemoglobin 13.4 12.0 - 16.0 g/dL   HCT 40.3 35.0 - 47.0 %   MCV 82.3 80.0 - 100.0 fL   MCH 27.5 26.0 - 34.0 pg   MCHC 33.3 32.0 - 36.0 g/dL   RDW 12.9 11.5 - 14.5 %   Platelets 263 150 - 440 K/uL   Neutrophils Relative % 91 %   Neutro  Abs 16.9 (H) 1.4 - 6.5 K/uL   Lymphocytes Relative 5 %   Lymphs Abs 0.9 (L) 1.0 - 3.6 K/uL   Monocytes Relative 4 %   Monocytes Absolute 0.6 0.2 - 0.9 K/uL   Eosinophils Relative 0 %   Eosinophils Absolute 0.0 0 - 0.7 K/uL   Basophils Relative 0 %   Basophils Absolute 0.0 0 - 0.1 K/uL  Comprehensive metabolic panel     Status: Abnormal   Collection Time: 03/25/15 11:53 AM  Result Value Ref Range   Sodium 143 135 - 145 mmol/L   Potassium 3.7 3.5 - 5.1 mmol/L   Chloride 109 101 - 111 mmol/L   CO2 25 22 - 32 mmol/L   Glucose, Bld 123 (H) 65 - 99 mg/dL   BUN 10 6 - 20 mg/dL   Creatinine, Ser 0.70 0.44 - 1.00 mg/dL   Calcium 9.0 8.9 - 10.3 mg/dL   Total Protein 7.9 6.5 - 8.1 g/dL   Albumin 4.6 3.5 - 5.0 g/dL   AST 34 15 -  41 U/L   ALT 28 14 - 54 U/L   Alkaline Phosphatase 80 38 - 126 U/L   Total Bilirubin 0.5 0.3 - 1.2 mg/dL   GFR calc non Af Amer >60 >60 mL/min   GFR calc Af Amer >60 >60 mL/min    Comment: (NOTE) The eGFR has been calculated using the CKD EPI equation. This calculation has not been validated in all clinical situations. eGFR's persistently <60 mL/min signify possible Chronic Kidney Disease.    Anion gap 9 5 - 15  Lipase, blood     Status: Abnormal   Collection Time: 03/25/15 11:53 AM  Result Value Ref Range   Lipase 14 (L) 22 - 51 U/L  Urinalysis complete, with microscopic ([facility] only)     Status: Abnormal   Collection Time: 03/25/15  1:00 PM  Result Value Ref Range   Color, Urine YELLOW (A) YELLOW   APPearance TURBID (A) CLEAR   Glucose, UA NEGATIVE NEGATIVE mg/dL   Bilirubin Urine NEGATIVE NEGATIVE   Ketones, ur NEGATIVE NEGATIVE mg/dL   Specific Gravity, Urine 1.020 1.005 - 1.030   Hgb urine dipstick NEGATIVE NEGATIVE   pH 5.0 5.0 - 8.0   Protein, ur 30 (A) NEGATIVE mg/dL   Nitrite NEGATIVE NEGATIVE   Leukocytes, UA NEGATIVE NEGATIVE   RBC / HPF 0-5 0 - 5 RBC/hpf   WBC, UA TOO NUMEROUS TO COUNT 0 - 5 WBC/hpf   Bacteria, UA FEW (A) NONE SEEN    Squamous Epithelial / LPF 6-30 (A) NONE SEEN   Amorphous Crystal PRESENT   Urine culture     Status: None   Collection Time: 03/25/15  1:00 PM  Result Value Ref Range   Specimen Description URINE, RANDOM    Special Requests NONE    Culture 4,000 COLONIES/mL INSIGNIFICANT GROWTH    Report Status 03/27/2015 FINAL   Pregnancy, urine POC     Status: None   Collection Time: 03/25/15  1:01 PM  Result Value Ref Range   Preg Test, Ur NEGATIVE NEGATIVE    Comment:        THE SENSITIVITY OF THIS METHODOLOGY IS >24 mIU/mL   Lipase, blood     Status: Abnormal   Collection Time: 03/26/15  4:38 PM  Result Value Ref Range   Lipase 17 (L) 22 - 51 U/L  Comprehensive metabolic panel     Status: None   Collection Time: 03/26/15  4:38 PM  Result Value Ref Range   Sodium 141 135 - 145 mmol/L   Potassium 3.5 3.5 - 5.1 mmol/L   Chloride 108 101 - 111 mmol/L   CO2 25 22 - 32 mmol/L   Glucose, Bld 95 65 - 99 mg/dL   BUN 11 6 - 20 mg/dL   Creatinine, Ser 0.78 0.44 - 1.00 mg/dL   Calcium 9.4 8.9 - 10.3 mg/dL   Total Protein 8.0 6.5 - 8.1 g/dL   Albumin 4.5 3.5 - 5.0 g/dL   AST 33 15 - 41 U/L   ALT 25 14 - 54 U/L   Alkaline Phosphatase 84 38 - 126 U/L   Total Bilirubin 0.6 0.3 - 1.2 mg/dL   GFR calc non Af Amer >60 >60 mL/min   GFR calc Af Amer >60 >60 mL/min    Comment: (NOTE) The eGFR has been calculated using the CKD EPI equation. This calculation has not been validated in all clinical situations. eGFR's persistently <60 mL/min signify possible Chronic Kidney Disease.    Anion gap 8 5 -  15  CBC     Status: Abnormal   Collection Time: 03/26/15  4:38 PM  Result Value Ref Range   WBC 11.2 (H) 3.6 - 11.0 K/uL   RBC 4.99 3.80 - 5.20 MIL/uL   Hemoglobin 13.8 12.0 - 16.0 g/dL   HCT 41.0 35.0 - 47.0 %   MCV 82.3 80.0 - 100.0 fL   MCH 27.6 26.0 - 34.0 pg   MCHC 33.5 32.0 - 36.0 g/dL   RDW 13.1 11.5 - 14.5 %   Platelets 273 150 - 440 K/uL   Results for orders placed or performed in visit on  03/30/15 (from the past 24 hour(s))  POCT urinalysis dipstick     Status: Abnormal   Collection Time: 03/30/15  1:55 PM  Result Value Ref Range   Color, UA yellow    Clarity, UA cloudy    Glucose, UA negative    Bilirubin, UA negative    Ketones, UA negative    Spec Grav, UA 1.015    Blood, UA large    pH, UA 7.5    Protein, UA negative    Urobilinogen, UA 0.2    Nitrite, UA negative    Leukocytes, UA small (1+) (A) Negative    Assessment & Plan  1. Chronic generalized abdominal pain IBS vs Celiac vs undiagnosed infectious etiology. Will get extended lab work and stool studies. Bentyl dose decreased from 20 mg to 10 mg po qid. Zofran dose increased. Stop antibiotic use. Adding on PPI as it may help with gastritis and inflammation.  - POCT urinalysis dipstick - ondansetron (ZOFRAN-ODT) 8 MG disintegrating tablet; Take 1 tablet (8 mg total) by mouth every 8 (eight) hours as needed for nausea or vomiting.  Dispense: 30 tablet; Refill: 2 - ibuprofen (ADVIL,MOTRIN) 800 MG tablet; Take 1 tablet (800 mg total) by mouth every 8 (eight) hours as needed.  Dispense: 50 tablet; Refill: 1 - omeprazole (PRILOSEC) 40 MG capsule; Take 1 capsule (40 mg total) by mouth daily.  Dispense: 30 capsule; Refill: 0 - H. pylori breath test; Future - CBC with Differential/Platelet - H. pylori antigen, stool - Fecal lactoferrin - Ova and Parasite Examination - Clostridium difficile culture-fecal - Stool culture - Gliadin Antibodies, Serum - Tissue Transglutaminase, IGA - Celiac Panel - dicyclomine (BENTYL) 10 MG capsule; Take 1 capsule (10 mg total) by mouth 4 (four) times daily -  before meals and at bedtime.  Dispense: 120 capsule; Refill: 1  2. Chronic diarrhea See assessment #1.  - H. pylori breath test; Future - CBC with Differential/Platelet - H. pylori antigen, stool - Fecal lactoferrin - Ova and Parasite Examination - Clostridium difficile culture-fecal - Stool culture - Gliadin  Antibodies, Serum - Tissue Transglutaminase, IGA - Celiac Panel

## 2015-03-30 NOTE — Patient Instructions (Signed)
Irritable Bowel Syndrome Irritable bowel syndrome (IBS) is caused by a disturbance of normal bowel function and is a common digestive disorder. You may also hear this condition called spastic colon, mucous colitis, and irritable colon. There is no cure for IBS. However, symptoms often gradually improve or disappear with a good diet, stress management, and medicine. This condition usually appears in late adolescence or early adulthood. Women develop it twice as often as men. CAUSES  After food has been digested and absorbed in the small intestine, waste material is moved into the large intestine, or colon. In the colon, water and salts are absorbed from the undigested products coming from the small intestine. The remaining residue, or fecal material, is held for elimination. Under normal circumstances, gentle, rhythmic contractions of the bowel walls push the fecal material along the colon toward the rectum. In IBS, however, these contractions are irregular and poorly coordinated. The fecal material is either retained too long, resulting in constipation, or expelled too soon, producing diarrhea. SIGNS AND SYMPTOMS  The most common symptom of IBS is abdominal pain. It is often in the lower left side of the abdomen, but it may occur anywhere in the abdomen. The pain comes from spasms of the bowel muscles happening too much and from the buildup of gas and fecal material in the colon. This pain:  Can range from sharp abdominal cramps to a dull, continuous ache.  Often worsens soon after eating.  Is often relieved by having a bowel movement or passing gas. Abdominal pain is usually accompanied by constipation, but it may also produce diarrhea. The diarrhea often occurs right after a meal or upon waking up in the morning. The stools are often soft, watery, and flecked with mucus. Other symptoms of IBS include:  Bloating.  Loss of appetite.  Heartburn.  Backache.  Dull pain in the arms or  shoulders.  Nausea.  Burping.  Vomiting.  Gas. IBS may also cause symptoms that are unrelated to the digestive system, such as:  Fatigue.  Headaches.  Anxiety.  Shortness of breath.  Trouble concentrating.  Dizziness. These symptoms tend to come and go. DIAGNOSIS  The symptoms of IBS may seem like symptoms of other, more serious digestive disorders. Your health care provider may want to perform tests to exclude these disorders.  TREATMENT Many medicines are available to help correct bowel function or relieve bowel spasms and abdominal pain. Among the medicines available are:  Laxatives for severe constipation and to help restore normal bowel habits.  Specific antidiarrheal medicines to treat severe or lasting diarrhea.  Antispasmodic agents to relieve intestinal cramps. Your health care provider may also decide to treat you with a mild tranquilizer or sedative during unusually stressful periods in your life. Your health care provider may also prescribe antidepressant medicine. The use of this medicine has been shown to reduce pain and other symptoms of IBS. Remember that if any medicine is prescribed for you, you should take it exactly as directed. Make sure your health care provider knows how well it worked for you. HOME CARE INSTRUCTIONS   Take all medicines as directed by your health care provider.  Avoid foods that are high in fat or oils, such as heavy cream, butter, frankfurters, sausage, and other fatty meats.  Avoid foods that make you go to the bathroom, such as fruit, fruit juice, and dairy products.  Cut out carbonated drinks, chewing gum, and "gassy" foods such as beans and cabbage. This may help relieve bloating and burping.    Eat foods with bran, and drink plenty of liquids with the bran foods. This helps relieve constipation.  Keep track of what foods seem to bring on your symptoms.  Avoid emotionally charged situations or circumstances that produce  anxiety.  Start or continue exercising.  Get plenty of rest and sleep. Document Released: 07/08/2005 Document Revised: 07/13/2013 Document Reviewed: 02/26/2008 [employer] Patient Information 2015 [employer]. This information is not intended to replace advice given to you by your health care provider. Make sure you discuss any questions you have with your health care provider.  

## 2015-03-31 LAB — CBC WITH DIFFERENTIAL/PLATELET
BASOS: 0 %
Basophils Absolute: 0 10*3/uL (ref 0.0–0.2)
EOS (ABSOLUTE): 0.2 10*3/uL (ref 0.0–0.4)
EOS: 2 %
HEMOGLOBIN: 14.2 g/dL (ref 11.1–15.9)
Hematocrit: 42.1 % (ref 34.0–46.6)
IMMATURE GRANS (ABS): 0 10*3/uL (ref 0.0–0.1)
Immature Granulocytes: 0 %
LYMPHS: 23 %
Lymphocytes Absolute: 2.4 10*3/uL (ref 0.7–3.1)
MCH: 27.7 pg (ref 26.6–33.0)
MCHC: 33.7 g/dL (ref 31.5–35.7)
MCV: 82 fL (ref 79–97)
MONOCYTES: 6 %
Monocytes Absolute: 0.7 10*3/uL (ref 0.1–0.9)
NEUTROS ABS: 7.2 10*3/uL — AB (ref 1.4–7.0)
Neutrophils: 69 %
Platelets: 317 10*3/uL (ref 150–379)
RBC: 5.12 x10E6/uL (ref 3.77–5.28)
RDW: 13.4 % (ref 12.3–15.4)
WBC: 10.4 10*3/uL (ref 3.4–10.8)

## 2015-04-03 LAB — GLIA (IGA/G) + TTG IGA
ANTIGLIADIN ABS, IGA: 2 U (ref 0–19)
Gliadin IgG: 2 units (ref 0–19)

## 2015-04-05 ENCOUNTER — Other Ambulatory Visit: Payer: Self-pay | Admitting: Family Medicine

## 2015-04-07 ENCOUNTER — Telehealth: Payer: Self-pay | Admitting: Family Medicine

## 2015-04-07 LAB — TEST CODE CHANGE

## 2015-04-07 LAB — STOOL CULTURE

## 2015-04-07 LAB — H. PYLORI ANTIGEN, STOOL: H pylori Ag, Stl: NEGATIVE

## 2015-04-07 LAB — PLEASE NOTE

## 2015-04-07 NOTE — Telephone Encounter (Signed)
Please contact [facility] and find out why stool studies were canceled. If they were collected wrong or sent in wrong please communicate this with patient and ask if she would like to repeat the testing and give her the supplies.

## 2015-04-07 NOTE — Telephone Encounter (Signed)
Spoke to [place] with [facility] concerning the results from this patient's stool studies, and she stated that some of the specimen # were mixed up with the requisitions, but that they had everything that they needed. She stated she will talk with [facility] and will get the test completed.

## 2015-04-11 LAB — OVA AND PARASITE EXAMINATION

## 2015-04-11 LAB — PLEASE NOTE

## 2015-04-11 LAB — CLOSTRIDIUM DIFFICILE EIA: C DIFFICILE TOXINS A+ B, EIA: NEGATIVE

## 2015-04-13 LAB — STOOL CULTURE: E COLI SHIGA TOXIN ASSAY: NEGATIVE

## 2015-04-18 LAB — FECAL LACTOFERRIN, QUANT: LACTOFERRIN, FECAL, QUANT.: 1.52 ug/mL (ref 0.00–7.24)

## 2015-05-02 ENCOUNTER — Ambulatory Visit: Payer: Medicaid Other | Admitting: Family Medicine

## 2015-08-04 ENCOUNTER — Encounter: Payer: Self-pay | Admitting: Family Medicine

## 2015-08-04 ENCOUNTER — Ambulatory Visit (INDEPENDENT_AMBULATORY_CARE_PROVIDER_SITE_OTHER): Payer: Medicaid Other | Admitting: Family Medicine

## 2015-08-04 VITALS — BP 108/76 | HR 103 | Temp 98.7°F | Resp 14 | Ht 64.0 in | Wt 135.2 lb

## 2015-08-04 DIAGNOSIS — J0100 Acute maxillary sinusitis, unspecified: Secondary | ICD-10-CM

## 2015-08-04 DIAGNOSIS — J029 Acute pharyngitis, unspecified: Secondary | ICD-10-CM

## 2015-08-04 DIAGNOSIS — R509 Fever, unspecified: Secondary | ICD-10-CM

## 2015-08-04 DIAGNOSIS — J111 Influenza due to unidentified influenza virus with other respiratory manifestations: Secondary | ICD-10-CM

## 2015-08-04 DIAGNOSIS — J40 Bronchitis, not specified as acute or chronic: Secondary | ICD-10-CM

## 2015-08-04 LAB — POCT INFLUENZA A/B
INFLUENZA A, POC: POSITIVE — AB
INFLUENZA B, POC: POSITIVE — AB

## 2015-08-04 LAB — POCT RAPID STREP A (OFFICE): Rapid Strep A Screen: NEGATIVE

## 2015-08-04 MED ORDER — HYDROCOD POLST-CPM POLST ER 10-8 MG/5ML PO SUER
5.0000 mL | Freq: Two times a day (BID) | ORAL | Status: DC | PRN
Start: 2015-08-04 — End: 2015-10-06

## 2015-08-04 MED ORDER — OSELTAMIVIR PHOSPHATE 75 MG PO CAPS: 75.0000 mg | ORAL_CAPSULE | Freq: Two times a day (BID) | ORAL | Status: DC

## 2015-08-04 MED ORDER — AZITHROMYCIN 250 MG PO TABS: ORAL_TABLET | ORAL | Status: DC

## 2015-08-04 NOTE — Progress Notes (Signed)
Name: Tiffany Guzman   MRN: [id number]    DOB: [birth year]   Date:08/04/2015       Progress Note  Subjective  Chief Complaint  Chief Complaint  Patient presents with  . URI    cough, congestion, sore throat, ear pain, Fever 100-103 for 4 days    HPI  Influenza  Patient presents with a four-day history of fever or chills myalgias cough congestion sore throat and fever to 103. Over-the-counter meds have not been effective. Flu test today is positive. She had been exposed to influenza a friend whom she visited daily  Bronchitis  Patient presents with a greater than 4 day history of cough productive of purulent sputum. The cough is irritating and keep the patient awake at night. There has associated fever with chills as well as myalgias and a positive flu test..  Over-the-counter meds And completely effective.  Past Medical History  Diagnosis Date  . Chronic upper back pain   . Attention deficit hyperactivity disorder (ADHD)     Social History  Substance Use Topics  . Smoking status: Never Smoker   . Smokeless tobacco: Not on file  . Alcohol Use: No     Current outpatient prescriptions:  .  dicyclomine (BENTYL) 10 MG capsule, Take 1 capsule (10 mg total) by mouth 4 (four) times daily -  before meals and at bedtime., Disp: 120 capsule, Rfl: 1 .  ibuprofen (ADVIL,MOTRIN) 800 MG tablet, Take 1 tablet (800 mg total) by mouth every 8 (eight) hours as needed., Disp: 50 tablet, Rfl: 1 .  meloxicam (MOBIC) 7.5 MG tablet, Take 7.5 mg by mouth daily as needed (for inflammation.). , Disp: , Rfl:  .  omeprazole (PRILOSEC) 40 MG capsule, Take 1 capsule (40 mg total) by mouth daily., Disp: 30 capsule, Rfl: 0 .  ondansetron (ZOFRAN-ODT) 8 MG disintegrating tablet, Take 1 tablet (8 mg total) by mouth every 8 (eight) hours as needed for nausea or vomiting., Disp: 30 tablet, Rfl: 2  No Known Allergies  Review of Systems  Constitutional: Positive for fever, chills and malaise/fatigue.  Negative for weight loss.  HENT: Positive for congestion. Negative for hearing loss, sore throat and tinnitus.   Eyes: Negative for blurred vision, double vision and redness.  Respiratory: Positive for cough and sputum production. Negative for hemoptysis and shortness of breath.   Cardiovascular: Negative for chest pain, palpitations, orthopnea, claudication and leg swelling.  Gastrointestinal: Negative for heartburn, nausea, vomiting, diarrhea, constipation and blood in stool.  Genitourinary: Negative for dysuria, urgency, frequency and hematuria.  Musculoskeletal: Positive for myalgias. Negative for back pain, joint pain, falls and neck pain.  Skin: Negative for itching.  Neurological: Negative for dizziness, tingling, tremors, focal weakness, seizures, loss of consciousness, weakness and headaches.  Endo/Heme/Allergies: Does not bruise/bleed easily.  Psychiatric/Behavioral: Negative for depression and substance abuse. The patient is not nervous/anxious and does not have insomnia.      Objective  Filed Vitals:   08/04/15 0858  BP: 108/76  Pulse: 103  Temp: 98.7 F (37.1 C)  TempSrc: Oral  Resp: 14  Height: 5\' 4"  (1.626 m)  Weight: 135 lb 3.2 oz (61.326 kg)  SpO2: 98%     Physical Exam  Constitutional: She is oriented to person, place, and time and well-developed, well-nourished, and in no distress.  HENT:  Head: Normocephalic.  Copious clear nasal discharge is present there is some tenderness over the frontal and maxillary sinuses.  Eyes: EOM are normal. Pupils are equal, round, and reactive  to light.  Neck: Normal range of motion. No thyromegaly present.  Cardiovascular: Normal rate, regular rhythm and normal heart sounds.   No murmur heard. Pulmonary/Chest: Effort normal and breath sounds normal.  Abdominal: Soft. Bowel sounds are normal.  Musculoskeletal: Normal range of motion. She exhibits no edema.  Neurological: She is alert and oriented to person, place, and time.  No cranial nerve deficit. Gait normal.  Skin: Skin is warm and dry. No rash noted.  Psychiatric: Memory and affect normal.      Assessment & Plan  1. Flu Tests positive for AMB - POCT rapid strep A - oseltamivir (TAMIFLU) 75 MG capsule; Take 1 capsule (75 mg total) by mouth 2 (two) times daily.  Dispense: 10 capsule; Refill: 0 - chlorpheniramine-HYDROcodone (TUSSIONEX PENNKINETIC ER) 10-8 MG/5ML SUER; Take 5 mLs by mouth every 12 (twelve) hours as needed for cough.  Dispense: 140 mL; Refill: 0  2. Sore throat Saline gargles. - POCT rapid strep A - POCT Influenza A/B  4. Acute maxillary sinusitis, recurrence not specified  - azithromycin (ZITHROMAX) 250 MG tablet; Z pak #1 as directed  Dispense: 6 tablet; Refill: 0 Flonase  5. Bronchitis  Secondary to influenza - azithromycin (ZITHROMAX) 250 MG tablet; Z pak #1 as directed  Dispense: 6 tablet; Refill: 0 - chlorpheniramine-HYDROcodone (TUSSIONEX PENNKINETIC ER) 10-8 MG/5ML SUER; Take 5 mLs by mouth every 12 (twelve) hours as needed for cough.  Dispense: 140 mL; Refill: 0

## 2015-09-28 ENCOUNTER — Emergency Department
Admission: EM | Admit: 2015-09-28 | Discharge: 2015-09-28 | Disposition: A | Discharge status: Home / Self Care | Payer: Medicaid Other | Attending: Emergency Medicine | Admitting: Emergency Medicine

## 2015-09-28 ENCOUNTER — Encounter: Payer: Self-pay | Admitting: Emergency Medicine

## 2015-09-28 DIAGNOSIS — Z3202 Encounter for pregnancy test, result negative: Secondary | ICD-10-CM | POA: Insufficient documentation

## 2015-09-28 DIAGNOSIS — Z79899 Other long term (current) drug therapy: Secondary | ICD-10-CM | POA: Insufficient documentation

## 2015-09-28 DIAGNOSIS — R55 Syncope and collapse: Secondary | ICD-10-CM | POA: Diagnosis not present

## 2015-09-28 DIAGNOSIS — Z792 Long term (current) use of antibiotics: Secondary | ICD-10-CM | POA: Insufficient documentation

## 2015-09-28 DIAGNOSIS — R519 Headache, unspecified: Secondary | ICD-10-CM

## 2015-09-28 DIAGNOSIS — R51 Headache: Secondary | ICD-10-CM | POA: Insufficient documentation

## 2015-09-28 DIAGNOSIS — F329 Major depressive disorder, single episode, unspecified: Secondary | ICD-10-CM | POA: Diagnosis not present

## 2015-09-28 LAB — COMPREHENSIVE METABOLIC PANEL
ALT: 17 U/L (ref 14–54)
ANION GAP: 7 (ref 5–15)
AST: 22 U/L (ref 15–41)
Albumin: 5 g/dL (ref 3.5–5.0)
Alkaline Phosphatase: 91 U/L (ref 38–126)
BILIRUBIN TOTAL: 0.9 mg/dL (ref 0.3–1.2)
BUN: 10 mg/dL (ref 6–20)
CO2: 25 mmol/L (ref 22–32)
Calcium: 9.2 mg/dL (ref 8.9–10.3)
Chloride: 107 mmol/L (ref 101–111)
Creatinine, Ser: 0.66 mg/dL (ref 0.44–1.00)
Glucose, Bld: 103 mg/dL — ABNORMAL HIGH (ref 65–99)
POTASSIUM: 3.6 mmol/L (ref 3.5–5.1)
Sodium: 139 mmol/L (ref 135–145)
TOTAL PROTEIN: 8.6 g/dL — AB (ref 6.5–8.1)

## 2015-09-28 LAB — URINALYSIS COMPLETE WITH MICROSCOPIC (ARMC ONLY)
Bilirubin Urine: NEGATIVE
Glucose, UA: NEGATIVE mg/dL
Hgb urine dipstick: NEGATIVE
LEUKOCYTES UA: NEGATIVE
NITRITE: NEGATIVE
PROTEIN: 30 mg/dL — AB
SPECIFIC GRAVITY, URINE: 1.024 (ref 1.005–1.030)
pH: 6 (ref 5.0–8.0)

## 2015-09-28 LAB — PREGNANCY, URINE: Preg Test, Ur: NEGATIVE

## 2015-09-28 LAB — CBC
HEMATOCRIT: 42.1 % (ref 35.0–47.0)
HEMOGLOBIN: 13.9 g/dL (ref 12.0–16.0)
MCH: 26.8 pg (ref 26.0–34.0)
MCHC: 33.1 g/dL (ref 32.0–36.0)
MCV: 81 fL (ref 80.0–100.0)
Platelets: 277 10*3/uL (ref 150–440)
RBC: 5.2 MIL/uL (ref 3.80–5.20)
RDW: 13.8 % (ref 11.5–14.5)
WBC: 13.2 10*3/uL — AB (ref 3.6–11.0)

## 2015-09-28 LAB — PHOSPHORUS: Phosphorus: 3.1 mg/dL (ref 2.5–4.6)

## 2015-09-28 LAB — MAGNESIUM: Magnesium: 2.1 mg/dL (ref 1.7–2.4)

## 2015-09-28 MED ORDER — KETOROLAC TROMETHAMINE 30 MG/ML IJ SOLN
30.0000 mg | Freq: Once | INTRAMUSCULAR | Status: AC
  Administered 2015-09-28: 30 mg via INTRAVENOUS
  Filled 2015-09-28: qty 1

## 2015-09-28 MED ORDER — SODIUM CHLORIDE 0.9 % IV BOLUS (SEPSIS)
1000.0000 mL | Freq: Once | INTRAVENOUS | Status: AC
  Administered 2015-09-28: 1000 mL via INTRAVENOUS

## 2015-09-28 MED ORDER — METOCLOPRAMIDE HCL 5 MG/ML IJ SOLN
10.0000 mg | Freq: Once | INTRAMUSCULAR | Status: AC
  Administered 2015-09-28: 10 mg via INTRAVENOUS
  Filled 2015-09-28: qty 2

## 2015-09-28 MED ORDER — DIPHENHYDRAMINE HCL 50 MG/ML IJ SOLN
25.0000 mg | Freq: Once | INTRAMUSCULAR | Status: AC
  Administered 2015-09-28: 25 mg via INTRAVENOUS
  Filled 2015-09-28: qty 1

## 2015-09-28 NOTE — ED Notes (Signed)
Pt reports awoke this am at about 3 feeling nauseated and with HA. Pt reports she went to the bathroom vomiting and when she did she got light headed and went down. Pt states that she thinks she may have blacked out for a minute but is unsure. Pt reports hit her head in the back. No redness or swelling noted.

## 2015-09-28 NOTE — Discharge Instructions (Signed)
You were evaluated for headache and passing out, and your exam and evaluation are reassuring in the emergency department. We discussed, if you develop any worsening headache, any confusion, altered mental status, weakness, numbness, additional episodes of passing out, or seizure, you should return to the emergency department immediately.  As discussed, I suspect she had a migraine headache and a vasovagal syncope, but will you will need further evaluation for formal diagnosis with your primary care physician and neurology.   General Headache Without Cause A headache is pain or discomfort felt around the head or neck area. The specific cause of a headache may not be found. There are many causes and types of headaches. A few common ones are:  Tension headaches.  Migraine headaches.  Cluster headaches.  Chronic daily headaches. HOME CARE INSTRUCTIONS  Watch your condition for any changes. Take these steps to help with your condition: Managing Pain  Take over-the-counter and prescription medicines only as told by your health care provider.  Lie down in a dark, quiet room when you have a headache.  If directed, apply ice to the head and neck area:  Put ice in a plastic bag.  Place a towel between your skin and the bag.  Leave the ice on for 20 minutes, 2-3 times per day.  Use a heating pad or hot shower to apply heat to the head and neck area as told by your health care provider.  Keep lights dim if bright lights bother you or make your headaches worse. Eating and Drinking  Eat meals on a regular schedule.  Limit alcohol use.  Decrease the amount of caffeine you drink, or stop drinking caffeine. General Instructions  Keep all follow-up visits as told by your health care provider. This is important.  Keep a headache journal to help find out what may trigger your headaches. For example, write down:  What you eat and drink.  How much sleep you get.  Any change to your diet or  medicines.  Try massage or other relaxation techniques.  Limit stress.  Sit up straight, and do not tense your muscles.  Do not use tobacco products, including cigarettes, chewing tobacco, or e-cigarettes. If you need help quitting, ask your health care provider.  Exercise regularly as told by your health care provider.  Sleep on a regular schedule. Get 7-9 hours of sleep, or the amount recommended by your health care provider. SEEK MEDICAL CARE IF:   Your symptoms are not helped by medicine.  You have a headache that is different from the usual headache.  You have nausea or you vomit.  You have a fever. SEEK IMMEDIATE MEDICAL CARE IF:   Your headache becomes severe.  You have repeated vomiting.  You have a stiff neck.  You have a loss of vision.  You have problems with speech.  You have pain in the eye or ear.  You have muscular weakness or loss of muscle control.  You lose your balance or have trouble walking.  You feel faint or pass out.  You have confusion.   This information is not intended to replace advice given to you by your health care provider. Make sure you discuss any questions you have with your health care provider.   Document Released: 07/08/2005 Document Revised: 03/29/2015 Document Reviewed: 10/31/2014 Elsevier Interactive Patient Education 2016 [employer].   Syncope, commonly known as fainting, is a temporary loss of consciousness. It occurs when the blood flow to the brain is reduced. Vasovagal syncope (  also called neurocardiogenic syncope) is a fainting spell in which the blood flow to the brain is reduced because of a sudden drop in heart rate and blood pressure. Vasovagal syncope occurs when the brain and the cardiovascular system (blood vessels) do not adequately communicate and respond to each other. This is the most common cause of fainting. It often occurs in response to fear or some other type of emotional or physical stress. The body  has a reaction in which the heart starts beating too slowly or the blood vessels expand, reducing blood pressure. This type of fainting spell is generally considered harmless. However, injuries can occur if a person takes a sudden fall during a fainting spell.  CAUSES  Vasovagal syncope occurs when a person's blood pressure and heart rate decrease suddenly, usually in response to a trigger. Many things and situations can trigger an episode. Some of these include:   Pain.   Fear.   The sight of blood or medical procedures, such as blood being drawn from a vein.   Common activities, such as coughing, swallowing, stretching, or going to the bathroom.   Emotional stress.   Prolonged standing, especially in a warm environment.   Lack of sleep or rest.   Prolonged lack of food.   Prolonged lack of fluids.   Recent illness.  The use of certain drugs that affect blood pressure, such as cocaine, alcohol, marijuana, inhalants, and opiates.  SYMPTOMS  Before the fainting episode, you may:   Feel dizzy or light headed.   Become pale.  Sense that you are going to faint.   Feel like the room is spinning.   Have tunnel vision, only seeing directly in front of you.   Feel sick to your stomach (nauseous).   See spots or slowly lose vision.   Hear ringing in your ears.   Have a headache.   Feel warm and sweaty.   Feel a sensation of pins and needles. During the fainting spell, you will generally be unconscious for no longer than a couple minutes before waking up and returning to normal. If you get up too quickly before your body can recover, you may faint again. Some twitching or jerky movements may occur during the fainting spell.  DIAGNOSIS  Your health care provider will ask about your symptoms, take a medical history, and perform a physical exam. Various tests may be done to rule out other causes of fainting. These may include blood tests and tests to check the  heart, such as electrocardiography, echocardiography, and possibly an electrophysiology study. When other causes have been ruled out, a test may be done to check the body's response to changes in position (tilt table test). TREATMENT  Most cases of vasovagal syncope do not require treatment. Your health care provider may recommend ways to avoid fainting triggers and may provide home strategies for preventing fainting. If you must be exposed to a possible trigger, you can drink additional fluids to help reduce your chances of having an episode of vasovagal syncope. If you have warning signs of an oncoming episode, you can respond by positioning yourself favorably (lying down). If your fainting spells continue, you may be given medicines to prevent fainting. Some medicines may help make you more resistant to repeated episodes of vasovagal syncope. Special exercises or compression stockings may be recommended. In rare cases, the surgical placement of a pacemaker is considered. HOME CARE INSTRUCTIONS   Learn to identify the warning signs of vasovagal syncope.   Sit  or lie down at the first warning sign of a fainting spell. If sitting, put your head down between your legs. If you lie down, swing your legs up in the air to increase blood flow to the brain.   Avoid hot tubs and saunas.  Avoid prolonged standing.  Drink enough fluids to keep your urine clear or pale yellow. Avoid caffeine.  Increase salt in your diet as directed by your health care provider.   If you have to stand for a long time, perform movements such as:   Crossing your legs.   Flexing and stretching your leg muscles.   Squatting.   Moving your legs.   Bending over.   Only take over-the-counter or prescription medicines as directed by your health care provider. Do not suddenly stop any medicines without asking your health care provider first. [place] IF:   Your fainting spells continue or happen more  frequently in spite of treatment.   You lose consciousness for more than a couple minutes.  You have fainting spells during or after exercising or after being startled.   You have new symptoms that occur with the fainting spells, such as:   Shortness of breath.  Chest pain.   Irregular heartbeat.   You have episodes of twitching or jerky movements that last longer than a few seconds.  You have episodes of twitching or jerky movements without obvious fainting. SEEK IMMEDIATE MEDICAL CARE IF:   You have injuries or bleeding after a fainting spell.   You have episodes of twitching or jerky movements that last longer than 5 minutes.   You have more than one spell of twitching or jerky movements before returning to consciousness after fainting.   This information is not intended to replace advice given to you by your health care provider. Make sure you discuss any questions you have with your health care provider.   Document Released: 06/24/2012 Document Revised: 11/22/2014 Document Reviewed: 06/24/2012 Elsevier Interactive Patient Education 2016 [employer]. Migraine Headache A migraine headache is an intense, throbbing pain on one or both sides of your head. A migraine can last for 30 minutes to several hours. CAUSES  The exact cause of a migraine headache is not always known. However, a migraine may be caused when nerves in the brain become irritated and release chemicals that cause inflammation. This causes pain. Certain things may also trigger migraines, such as:  Alcohol.  Smoking.  Stress.  Menstruation.  Aged cheeses.  Foods or drinks that contain nitrates, glutamate, aspartame, or tyramine.  Lack of sleep.  Chocolate.  Caffeine.  Hunger.  Physical exertion.  Fatigue.  Medicines used to treat chest pain (nitroglycerine), birth control pills, estrogen, and some blood pressure medicines. SIGNS AND SYMPTOMS  Pain on one or both sides of your  head.  Pulsating or throbbing pain.  Severe pain that prevents daily activities.  Pain that is aggravated by any physical activity.  Nausea, vomiting, or both.  Dizziness.  Pain with exposure to bright lights, loud noises, or activity.  General sensitivity to bright lights, loud noises, or smells. Before you get a migraine, you may get warning signs that a migraine is coming (aura). An aura may include:  Seeing flashing lights.  Seeing bright spots, halos, or zigzag lines.  Having tunnel vision or blurred vision.  Having feelings of numbness or tingling.  Having trouble talking.  Having muscle weakness. DIAGNOSIS  A migraine headache is often diagnosed based on:  Symptoms.  Physical exam.  A CT scan or MRI of your head. These imaging tests cannot diagnose migraines, but they can help rule out other causes of headaches. TREATMENT Medicines may be given for pain and nausea. Medicines can also be given to help prevent recurrent migraines.  HOME CARE INSTRUCTIONS  Only take over-the-counter or prescription medicines for pain or discomfort as directed by your health care provider. The use of long-term narcotics is not recommended.  Lie down in a dark, quiet room when you have a migraine.  Keep a journal to find out what may trigger your migraine headaches. For example, write down:  What you eat and drink.  How much sleep you get.  Any change to your diet or medicines.  Limit alcohol consumption.  Quit smoking if you smoke.  Get 7-9 hours of sleep, or as recommended by your health care provider.  Limit stress.  Keep lights dim if bright lights bother you and make your migraines worse. SEEK IMMEDIATE MEDICAL CARE IF:   Your migraine becomes severe.  You have a fever.  You have a stiff neck.  You have vision loss.  You have muscular weakness or loss of muscle control.  You start losing your balance or have trouble walking.  You feel faint or pass  out.  You have severe symptoms that are different from your first symptoms. MAKE SURE YOU:   Understand these instructions.  Will watch your condition.  Will get help right away if you are not doing well or get worse.   This information is not intended to replace advice given to you by your health care provider. Make sure you discuss any questions you have with your health care provider.   Document Released: 07/08/2005 Document Revised: 07/29/2014 Document Reviewed: 03/15/2013 Elsevier Interactive Patient Education [employer].

## 2015-09-28 NOTE — ED Notes (Addendum)
States she developed some nausea/vomiting about 4 am   Went to take a shower became light headed and passed out  Head hit   Possible LOC   Also having some abd pain

## 2015-09-28 NOTE — ED Provider Notes (Signed)
[facility] Emergency Department Provider Note   ____________________________________________  Time seen: I have reviewed the triage vital signs and the triage nursing note.  HISTORY  Chief Complaint Head Injury   Historian Patient  HPI Tiffany Guzman is a 20 y.o. female with a family history of migraines, and a personal history of headaches without having been evaluated by a neurologist, who is here for evaluation after waking up this morning feeling nauseated and having multiple episodes of emesis followed by likely syncope where she was in the bathroom and thinks that she may have struck her head in the back. She is currently just complaining of moderate frontal headache. No additional neurologic symptoms such as weakness or numbness or seizure activity.  She reports no cardiac symptoms of chest pain, shortness breath, or palpitations.  Headache is moderate and frontal in similar location to prior headaches.  Mild decreased by mouth intake yesterday. She reports some depressed mood, without any suicidal ideation.    Past Medical History  Diagnosis Date  . Chronic upper back pain   . Attention deficit hyperactivity disorder (ADHD)     Patient Active Problem List   Diagnosis Date Noted  . Chronic generalized abdominal pain 03/30/2015  . Chronic diarrhea 03/30/2015  . Chronic lumbar pain 03/16/2015  . Implantable subdermal contraceptive surveillance 03/16/2015  . Influenza vaccination declined by patient 03/16/2015    Past Surgical History  Procedure Laterality Date  . Insertion of non vaginal contraceptive device      Current Outpatient Rx  Name  Route  Sig  Dispense  Refill  . azithromycin (ZITHROMAX) 250 MG tablet      Z pak #1 as directed   6 tablet   0   . chlorpheniramine-HYDROcodone (TUSSIONEX PENNKINETIC ER) 10-8 MG/5ML SUER   Oral   Take 5 mLs by mouth every 12 (twelve) hours as needed for cough.   140 mL   0   .  dicyclomine (BENTYL) 10 MG capsule   Oral   Take 1 capsule (10 mg total) by mouth 4 (four) times daily -  before meals and at bedtime.   120 capsule   1   . ibuprofen (ADVIL,MOTRIN) 800 MG tablet   Oral   Take 1 tablet (800 mg total) by mouth every 8 (eight) hours as needed.   50 tablet   1   . meloxicam (MOBIC) 7.5 MG tablet   Oral   Take 7.5 mg by mouth daily as needed (for inflammation.).          Marland Kitchen omeprazole (PRILOSEC) 40 MG capsule   Oral   Take 1 capsule (40 mg total) by mouth daily.   30 capsule   0   . ondansetron (ZOFRAN-ODT) 8 MG disintegrating tablet   Oral   Take 1 tablet (8 mg total) by mouth every 8 (eight) hours as needed for nausea or vomiting.   30 tablet   2   . oseltamivir (TAMIFLU) 75 MG capsule   Oral   Take 1 capsule (75 mg total) by mouth 2 (two) times daily.   10 capsule   0     Allergies Review of patient's allergies indicates no known allergies.  No family history on file.  Social History Social History  Substance Use Topics  . Smoking status: Never Smoker   . Smokeless tobacco: None  . Alcohol Use: No    Review of Systems  Constitutional: Negative for fever. Eyes: Negative for visual changes. ENT: Negative for  sore throat. Cardiovascular: Negative for chest pain. Respiratory: Negative for shortness of breath. Gastrointestinal: Negative for Diarrhea Genitourinary: Negative for dysuria. Musculoskeletal: Negative for back pain. Skin: Negative for rash. Neurological: Positive for headache. 10 point Review of Systems otherwise negative ____________________________________________   PHYSICAL EXAM:  VITAL SIGNS: ED Triage Vitals  Enc Vitals Group     BP 09/28/15 0739 133/84 mmHg     Pulse Rate 09/28/15 0739 96     Resp 09/28/15 0739 18     Temp 09/28/15 0739 97.7 F (36.5 C)     Temp Source 09/28/15 0739 Oral     SpO2 09/28/15 0739 98 %     Weight 09/28/15 0739 134 lb (60.782 kg)     Height 09/28/15 0739 5\' 4"  (1.626  m)     Head Cir --      Peak Flow --      Pain Score 09/28/15 0738 8     Pain Loc --      Pain Edu? --      Excl. in [place]? --      Constitutional: Alert and oriented. Well appearing Overall and in no distress. HEENT   Head: Normocephalic and atraumatic.      Eyes: Conjunctivae are normal. PERRL. Normal extraocular movements.      Ears:         Nose: No congestion/rhinnorhea.   Mouth/Throat: Mucous membranes are moist.   Neck: No stridor. No stiffness and nontender. Cardiovascular/Chest: Normal rate, regular rhythm.  No murmurs, rubs, or gallops. Respiratory: Normal respiratory effort without tachypnea nor retractions. Breath sounds are clear and equal bilaterally. No wheezes/rales/rhonchi. Gastrointestinal: Soft. No distention, no guarding, no rebound. Nontender.    Genitourinary/rectal:Deferred Musculoskeletal: Nontender with normal range of motion in all extremities. No joint effusions.  No lower extremity tenderness.  No edema. Neurologic:  Normal speech and language. No gross or focal neurologic deficits are appreciated. Skin:  Skin is warm, dry and intact. No rash noted. Psychiatric: Reports some depressed mood without suicidal or homicidal ideation. Affect is normal. Speech and behavior are normal. Patient exhibits appropriate insight and judgment.  ____________________________________________   EKG I, Lisa Roca, MD, the attending physician have personally viewed and interpreted all ECGs.   99 beats per minute. Normal sinus rhythm. Narrow QRS. Left atrial enlargement. Normal axis. Normal ST and T-wave. ____________________________________________  LABS (pertinent positives/negatives)  Urinalysis rare bacteria and trace ketones otherwise without significant abnormality Pregnancy test negative Conference metabolic panel negative White blood count 13.2, hemoglobin 13.9 and platelet count 277 Magnesium 2.1 Phosphorus  3.1 ____________________________________________  RADIOLOGY All Xrays were viewed by me. Imaging interpreted by Radiologist.  None __________________________________________  PROCEDURES  Procedure(s) performed: None  Critical Care performed: None  ____________________________________________   ED COURSE / ASSESSMENT AND PLAN  Pertinent labs & imaging results that were available during my care of the patient were reviewed by me and considered in my medical decision making (see chart for details).   Patient is here with an episode of nausea and vomiting followed by near syncope and migraine type headache.  She is overall well-appearing.   I am treating her for migraine type headache. She improved with symptomatic relief.  With her neurologic exam intact in no high risk/red flag features I am not recommending head CT at this point in time, patient understands reasoning behind this.  Since she has not had neuro imaging despite frequent headaches, I am recommending that she see her primary care physician and/or neurologist for frequent  headache evaluation and likely brain imaging with MRI.  She reports syncope/near syncope. I suspect this was likely vasovagal in the setting of the nausea/emesis.  In terms of her reporting depression, no emergent indication for emergency psychiatric evaluation. Patient feels comfortable following up with his primary care doctor as an outpatient, she was also given information handout for [facility].    CONSULTATIONS:   None   Patient / Family / Caregiver informed of clinical course, medical decision-making process, and agree with plan.   I discussed return precautions, follow-up instructions, and discharged instructions with patient and/or family.   ___________________________________________   FINAL CLINICAL IMPRESSION(S) / ED DIAGNOSES   Final diagnoses:  Syncope, unspecified syncope type  Acute nonintractable headache, unspecified headache  type              Note: This dictation was prepared with Dragon dictation. Any transcriptional errors that result from this process are unintentional   Lisa Roca, MD 09/28/15 1233

## 2015-09-28 NOTE — ED Notes (Signed)
Vitals performed in room by tech, Jeannene Patella

## 2015-09-28 NOTE — ED Notes (Signed)
Pt up to BR with steady gait. Denies dizziness.

## 2015-09-28 NOTE — ED Notes (Signed)
Pt states her HA has improved and the nausea is gone.. Pt resting quietly.Marland Kitchen

## 2015-09-28 NOTE — ED Notes (Signed)
Iv placed in left ac due to poor venous options

## 2015-10-06 ENCOUNTER — Ambulatory Visit (INDEPENDENT_AMBULATORY_CARE_PROVIDER_SITE_OTHER): Payer: Medicaid Other | Admitting: Family Medicine

## 2015-10-06 ENCOUNTER — Encounter: Payer: Self-pay | Admitting: Family Medicine

## 2015-10-06 VITALS — BP 116/68 | HR 96 | Temp 98.5°F | Resp 20 | Ht 64.0 in | Wt 137.1 lb

## 2015-10-06 DIAGNOSIS — B356 Tinea cruris: Secondary | ICD-10-CM

## 2015-10-06 MED ORDER — KETOCONAZOLE 2 % EX CREA: 1.0000 "application " | TOPICAL_CREAM | Freq: Two times a day (BID) | CUTANEOUS | Status: DC

## 2015-10-06 NOTE — Progress Notes (Signed)
Name: Tiffany Guzman   MRN: [id number]    DOB: 01/30/1996   Date:10/06/2015       Progress Note  Subjective  Chief Complaint  Chief Complaint  Patient presents with  . Rash    on rt knee for 1 week itchy(sm)    HPI  Tiffany Guzman is a 20 year old female who reports having a scaly, raised, red, itchy lesion just above her right knee since playing with some dogs at a friend's house. Not painful, warm or draining. No systemic symptoms. She has tried using hydrocortisone on it but it is still itchy.    Past Medical History  Diagnosis Date  . Chronic upper back pain   . Attention deficit hyperactivity disorder (ADHD)     Patient Active Problem List   Diagnosis Date Noted  . Chronic generalized abdominal pain 03/30/2015  . Chronic diarrhea 03/30/2015  . Chronic lumbar pain 03/16/2015  . Implantable subdermal contraceptive surveillance 03/16/2015    Social History  Substance Use Topics  . Smoking status: Never Smoker   . Smokeless tobacco: Not on file  . Alcohol Use: No     Current outpatient prescriptions:  .  ibuprofen (ADVIL,MOTRIN) 800 MG tablet, Take 1 tablet (800 mg total) by mouth every 8 (eight) hours as needed., Disp: 50 tablet, Rfl: 1 .  meloxicam (MOBIC) 7.5 MG tablet, Take 7.5 mg by mouth daily as needed (for inflammation.). , Disp: , Rfl:  .  dicyclomine (BENTYL) 10 MG capsule, Take 1 capsule (10 mg total) by mouth 4 (four) times daily -  before meals and at bedtime., Disp: 120 capsule, Rfl: 1  Past Surgical History  Procedure Laterality Date  . Insertion of non vaginal contraceptive device      No family history on file.  No Known Allergies  Review of Systems  Positive for rash as mentioned in HPI, otherwise all systems reviewed and are negative.  Objective  BP 116/68 mmHg  Pulse 96  Temp(Src) 98.5 F (36.9 C)  Resp 20  Ht 5\' 4"  (1.626 m)  Wt 137 lb 2 oz (62.199 kg)  BMI 23.53 kg/m2  SpO2 97%  Body mass index is 23.53  kg/(m^2).   Physical Exam  Constitutional: Patient appears well-developed and well-nourished. In no distress.  Cardiovascular: Normal rate, regular rhythm and normal heart sounds.  No murmur heard.  Pulmonary/Chest: Effort normal and breath sounds normal. No respiratory distress.  Skin: Skin is warm and dry. Right leg, just above the knee a 1cm circular red, scaly lesion with early central clearing, raised border.   Psychiatric: Patient has a normal mood and affect. Behavior is normal in office today. Judgment and thought content normal in office today.    Assessment & Plan  1. Tinea cruris Alternate anti-fungal with topical steroid cream. May use benadryl at night for pruritis.   - ketoconazole (NIZORAL) 2 % cream; Apply 1 application topically 2 (two) times daily.  Dispense: 15 g; Refill: 0

## 2015-10-06 NOTE — Patient Instructions (Signed)

## 2015-11-24 ENCOUNTER — Emergency Department
Admission: EM | Admit: 2015-11-24 | Discharge: 2015-11-24 | Disposition: A | Discharge status: Home / Self Care | Payer: Medicaid Other | Attending: Emergency Medicine | Admitting: Emergency Medicine

## 2015-11-24 ENCOUNTER — Encounter: Payer: Self-pay | Admitting: *Deleted

## 2015-11-24 DIAGNOSIS — Z79899 Other long term (current) drug therapy: Secondary | ICD-10-CM | POA: Diagnosis not present

## 2015-11-24 DIAGNOSIS — F909 Attention-deficit hyperactivity disorder, unspecified type: Secondary | ICD-10-CM | POA: Diagnosis not present

## 2015-11-24 DIAGNOSIS — K219 Gastro-esophageal reflux disease without esophagitis: Secondary | ICD-10-CM | POA: Diagnosis not present

## 2015-11-24 DIAGNOSIS — R109 Unspecified abdominal pain: Secondary | ICD-10-CM | POA: Diagnosis present

## 2015-11-24 DIAGNOSIS — F1290 Cannabis use, unspecified, uncomplicated: Secondary | ICD-10-CM | POA: Insufficient documentation

## 2015-11-24 DIAGNOSIS — Z791 Long term (current) use of non-steroidal anti-inflammatories (NSAID): Secondary | ICD-10-CM | POA: Diagnosis not present

## 2015-11-24 DIAGNOSIS — R1084 Generalized abdominal pain: Secondary | ICD-10-CM

## 2015-11-24 LAB — URINALYSIS COMPLETE WITH MICROSCOPIC (ARMC ONLY)
Bacteria, UA: NONE SEEN
Bilirubin Urine: NEGATIVE
GLUCOSE, UA: NEGATIVE mg/dL
LEUKOCYTES UA: NEGATIVE
NITRITE: NEGATIVE
Protein, ur: 30 mg/dL — AB
SPECIFIC GRAVITY, URINE: 1.021 (ref 1.005–1.030)
pH: 5 (ref 5.0–8.0)

## 2015-11-24 LAB — COMPREHENSIVE METABOLIC PANEL
ALK PHOS: 75 U/L (ref 38–126)
ALT: 14 U/L (ref 14–54)
AST: 18 U/L (ref 15–41)
Albumin: 4.5 g/dL (ref 3.5–5.0)
Anion gap: 8 (ref 5–15)
BILIRUBIN TOTAL: 0.7 mg/dL (ref 0.3–1.2)
BUN: 9 mg/dL (ref 6–20)
CO2: 24 mmol/L (ref 22–32)
CREATININE: 0.81 mg/dL (ref 0.44–1.00)
Calcium: 9.1 mg/dL (ref 8.9–10.3)
Chloride: 107 mmol/L (ref 101–111)
GFR calc Af Amer: >60 mL/min (ref >60)
Glucose, Bld: 108 mg/dL — ABNORMAL HIGH (ref 65–99)
Potassium: 3.4 mmol/L — ABNORMAL LOW (ref 3.5–5.1)
Sodium: 139 mmol/L (ref 135–145)
TOTAL PROTEIN: 7.7 g/dL (ref 6.5–8.1)

## 2015-11-24 LAB — POCT PREGNANCY, URINE: PREG TEST UR: NEGATIVE

## 2015-11-24 LAB — CBC
HCT: 39.8 % (ref 35.0–47.0)
Hemoglobin: 13.4 g/dL (ref 12.0–16.0)
MCH: 27.5 pg (ref 26.0–34.0)
MCHC: 33.5 g/dL (ref 32.0–36.0)
MCV: 82.1 fL (ref 80.0–100.0)
PLATELETS: 240 10*3/uL (ref 150–440)
RBC: 4.85 MIL/uL (ref 3.80–5.20)
RDW: 13.1 % (ref 11.5–14.5)
WBC: 8.6 10*3/uL (ref 3.6–11.0)

## 2015-11-24 LAB — WET PREP, GENITAL
Clue Cells Wet Prep HPF POC: NONE SEEN
Sperm: NONE SEEN
Trich, Wet Prep: NONE SEEN
YEAST WET PREP: NONE SEEN

## 2015-11-24 LAB — CHLAMYDIA/NGC RT PCR (ARMC ONLY)
CHLAMYDIA TR: NOT DETECTED
N GONORRHOEAE: NOT DETECTED

## 2015-11-24 LAB — LIPASE, BLOOD: Lipase: 23 U/L (ref 11–51)

## 2015-11-24 MED ORDER — OXYCODONE-ACETAMINOPHEN 5-325 MG PO TABS
2.0000 | ORAL_TABLET | Freq: Once | ORAL | Status: AC
  Administered 2015-11-24: 2 via ORAL
  Filled 2015-11-24: qty 2

## 2015-11-24 MED ORDER — PROMETHAZINE HCL 25 MG PO TABS: 25.0000 mg | ORAL_TABLET | Freq: Four times a day (QID) | ORAL | Status: DC | PRN

## 2015-11-24 MED ORDER — FAMOTIDINE 20 MG PO TABS
20.0000 mg | ORAL_TABLET | Freq: Once | ORAL | Status: AC
  Administered 2015-11-24: 20 mg via ORAL
  Filled 2015-11-24: qty 1

## 2015-11-24 MED ORDER — SUCRALFATE 1 G PO TABS: 1.0000 g | ORAL_TABLET | Freq: Four times a day (QID) | ORAL | Status: DC

## 2015-11-24 MED ORDER — FAMOTIDINE 20 MG PO TABS: 20.0000 mg | ORAL_TABLET | Freq: Two times a day (BID) | ORAL | Status: DC

## 2015-11-24 MED ORDER — ONDANSETRON 4 MG PO TBDP
4.0000 mg | ORAL_TABLET | Freq: Once | ORAL | Status: AC
  Administered 2015-11-24: 4 mg via ORAL
  Filled 2015-11-24: qty 1

## 2015-11-24 NOTE — Discharge Instructions (Signed)
Abdominal Pain, Adult °Many things can cause abdominal pain. Usually, abdominal pain is not caused by a disease and will improve without treatment. It can often be observed and treated at home. Your health care provider will do a physical exam and possibly order blood tests and X-rays to help determine the seriousness of your pain. However, in many cases, more time must pass before a clear cause of the pain can be found. Before that point, your health care provider may not know if you need more testing or further treatment. °HOME CARE INSTRUCTIONS °Monitor your abdominal pain for any changes. The following actions may help to alleviate any discomfort you are experiencing: °· Only take over-the-counter or prescription medicines as directed by your health care provider. °· Do not take laxatives unless directed to do so by your health care provider. °· Try a clear liquid diet (broth, tea, or water) as directed by your health care provider. Slowly move to a bland diet as tolerated. °SEEK MEDICAL CARE IF: °· You have unexplained abdominal pain. °· You have abdominal pain associated with nausea or diarrhea. °· You have pain when you urinate or have a bowel movement. °· You experience abdominal pain that wakes you in the night. °· You have abdominal pain that is worsened or improved by eating food. °· You have abdominal pain that is worsened with eating fatty foods. °· You have a fever. °SEEK IMMEDIATE MEDICAL CARE IF: °· Your pain does not go away within 2 hours. °· You keep throwing up (vomiting). °· Your pain is felt only in portions of the abdomen, such as the right side or the left lower portion of the abdomen. °· You pass bloody or black tarry stools. °MAKE SURE YOU: °· Understand these instructions. °· Will watch your condition. °· Will get help right away if you are not doing well or get worse. °  °This information is not intended to replace advice given to you by your health care provider. Make sure you discuss  any questions you have with your health care provider. °  °Document Released: 04/17/2005 Document Revised: 03/29/2015 Document Reviewed: 03/17/2013 °Elsevier Interactive Patient Education ©2016 [employer] ° °Gastroesophageal Reflux Disease, Adult °Normally, food travels down the esophagus and stays in the stomach to be digested. However, when a person has gastroesophageal reflux disease (GERD), food and stomach acid move back up into the esophagus. When this happens, the esophagus becomes sore and inflamed. Over time, GERD can create small holes (ulcers) in the lining of the esophagus.  °CAUSES °This condition is caused by a problem with the muscle between the esophagus and the stomach (lower esophageal sphincter, or LES). Normally, the LES muscle closes after food passes through the esophagus to the stomach. When the LES is weakened or abnormal, it does not close properly, and that allows food and stomach acid to go back up into the esophagus. The LES can be weakened by certain dietary substances, medicines, and medical conditions, including: °· Tobacco use. °· Pregnancy. °· Having a hiatal hernia. °· Heavy alcohol use. °· Certain foods and beverages, such as coffee, chocolate, onions, and peppermint. °RISK FACTORS °This condition is more likely to develop in: °· People who have an increased body weight. °· People who have connective tissue disorders. °· People who use NSAID medicines. °SYMPTOMS °Symptoms of this condition include: °· Heartburn. °· Difficult or painful swallowing. °· The feeling of having a lump in the throat. °· A bitter taste in the mouth. °· Bad breath. °· Having   a large amount of saliva. °· Having an upset or bloated stomach. °· Belching. °· Chest pain. °· Shortness of breath or wheezing. °· Ongoing (chronic) cough or a night-time cough. °· Wearing away of tooth enamel. °· Weight loss. °Different conditions can cause chest pain. Make sure to see your health care provider if you experience  chest pain. °DIAGNOSIS °Your health care provider will take a medical history and perform a physical exam. To determine if you have mild or severe GERD, your health care provider may also monitor how you respond to treatment. You may also have other tests, including: °· An endoscopy to examine your stomach and esophagus with a small camera. °· A test that measures the acidity level in your esophagus. °· A test that measures how much pressure is on your esophagus. °· A barium swallow or modified barium swallow to show the shape, size, and functioning of your esophagus. °TREATMENT °The goal of treatment is to help relieve your symptoms and to prevent complications. Treatment for this condition may vary depending on how severe your symptoms are. Your health care provider may recommend: °· Changes to your diet. °· Medicine. °· Surgery. °HOME CARE INSTRUCTIONS °Diet °· Follow a diet as recommended by your health care provider. This may involve avoiding foods and drinks such as: °¨ Coffee and tea (with or without caffeine). °¨ Drinks that contain alcohol. °¨ Energy drinks and sports drinks. °¨ Carbonated drinks or sodas. °¨ Chocolate and cocoa. °¨ Peppermint and mint flavorings. °¨ Garlic and onions. °¨ Horseradish. °¨ Spicy and acidic foods, including peppers, chili powder, curry powder, vinegar, hot sauces, and barbecue sauce. °¨ Citrus fruit juices and citrus fruits, such as oranges, lemons, and limes. °¨ Tomato-based foods, such as red sauce, chili, salsa, and pizza with red sauce. °¨ Fried and fatty foods, such as donuts, french fries, potato chips, and high-fat dressings. °¨ High-fat meats, such as hot dogs and fatty cuts of red and white meats, such as rib eye steak, sausage, ham, and bacon. °¨ High-fat dairy items, such as whole milk, butter, and cream cheese. °· Eat small, frequent meals instead of large meals. °· Avoid drinking large amounts of liquid with your meals. °· Avoid eating meals during the 2-3 hours  before bedtime. °· Avoid lying down right after you eat. °· Do not exercise right after you eat. ° General Instructions  °· Pay attention to any changes in your symptoms. °· Take over-the-counter and prescription medicines only as told by your health care provider. Do not take aspirin, ibuprofen, or other NSAIDs unless your health care provider told you to do so. °· Do not use any tobacco products, including cigarettes, chewing tobacco, and e-cigarettes. If you need help quitting, ask your health care provider. °· Wear loose-fitting clothing. Do not wear anything tight around your waist that causes pressure on your abdomen. °· Raise (elevate) the head of your bed 6 inches (15cm). °· Try to reduce your stress, such as with yoga or meditation. If you need help reducing stress, ask your health care provider. °· If you are overweight, reduce your weight to an amount that is healthy for you. Ask your health care provider for guidance about a safe weight loss goal. °· Keep all follow-up visits as told by your health care provider. This is important. °SEEK MEDICAL CARE IF: °· You have new symptoms. °· You have unexplained weight loss. °· You have difficulty swallowing, or it hurts to swallow. °· You have wheezing or a persistent cough. °· Your symptoms do not   improve with treatment. °· You have a hoarse voice. °SEEK IMMEDIATE MEDICAL CARE IF: °· You have pain in your arms, neck, jaw, teeth, or back. °· You feel sweaty, dizzy, or light-headed. °· You have chest pain or shortness of breath. °· You vomit and your vomit looks like blood or coffee grounds. °· You faint. °· Your stool is bloody or black. °· You cannot swallow, drink, or eat. °  °This information is not intended to replace advice given to you by your health care provider. Make sure you discuss any questions you have with your health care provider. °  °Document Released: 04/17/2005 Document Revised: 03/29/2015 Document Reviewed: 11/02/2014 °Elsevier Interactive  Patient Education ©2016 [employer] ° °

## 2015-11-24 NOTE — ED Notes (Signed)
Called to room by pt who reports she feels nauseated. Will inform md.

## 2015-11-24 NOTE — ED Notes (Signed)
poct pregnancy Negative 

## 2015-11-24 NOTE — ED Provider Notes (Addendum)
[facility] Emergency Department Provider Note        Time seen: ----------------------------------------- 9:19 PM on 11/24/2015 -----------------------------------------    I have reviewed the triage vital signs and the nursing notes.   HISTORY  Chief Complaint Abdominal Pain    HPI Tiffany Guzman is a 20 y.o. female who presents ER for abdominal pain with dysuria and low back pain.Patient states symptoms began today, she also had vomiting and pain after she ate at [employer]. Patient reports similar pain in the past after she had alcohol, reports history of reflux problem as a child but none since. She denies fevers chills or other complaints.   Past Medical History  Diagnosis Date  . Chronic upper back pain   . Attention deficit hyperactivity disorder (ADHD)     Patient Active Problem List   Diagnosis Date Noted  . Tinea cruris 10/06/2015  . Chronic generalized abdominal pain 03/30/2015  . Chronic diarrhea 03/30/2015  . Chronic lumbar pain 03/16/2015  . Implantable subdermal contraceptive surveillance 03/16/2015    Past Surgical History  Procedure Laterality Date  . Insertion of non vaginal contraceptive device      Allergies Review of patient's allergies indicates no known allergies.  Social History Social History  Substance Use Topics  . Smoking status: Never Smoker   . Smokeless tobacco: None  . Alcohol Use: No    Review of Systems Constitutional: Negative for fever. Eyes: Negative for visual changes. ENT: Negative for sore throat. Cardiovascular: Negative for chest pain. Respiratory: Negative for shortness of breath. Gastrointestinal: Positive for abdominal pain and vomiting Genitourinary: Positive for dysuria, vaginal discharge Musculoskeletal: Positive for low back pain Skin: Negative for rash. Neurological: Negative for headaches, focal weakness or numbness.  10-point ROS otherwise  negative.  ____________________________________________   PHYSICAL EXAM:  VITAL SIGNS: ED Triage Vitals  Enc Vitals Group     BP 11/24/15 2027 112/80 mmHg     Pulse Rate 11/24/15 2027 84     Resp 11/24/15 2027 20     Temp 11/24/15 2027 97.6 F (36.4 C)     Temp Source 11/24/15 2027 Oral     SpO2 11/24/15 2027 99 %     Weight 11/24/15 2027 125 lb (56.7 kg)     Height 11/24/15 2027 5\' 4"  (1.626 m)     Head Cir --      Peak Flow --      Pain Score 11/24/15 2029 8     Pain Loc --      Pain Edu? --      Excl. in [place]? --     Constitutional: Alert and oriented. Well appearing and in no distress. Eyes: Conjunctivae are normal. PERRL. Normal extraocular movements. ENT   Head: Normocephalic and atraumatic.   Nose: No congestion/rhinnorhea.   Mouth/Throat: Mucous membranes are moist.   Neck: No stridor. Cardiovascular: Normal rate, regular rhythm. No murmurs, rubs, or gallops. Respiratory: Normal respiratory effort without tachypnea nor retractions. Breath sounds are clear and equal bilaterally. No wheezes/rales/rhonchi. Gastrointestinal: Soft and nontender. Normal bowel sounds, No CVA tenderness Genitourinary: Slight vaginal bleeding, no adnexal tenderness or fullness, no cervical motion tenderness Musculoskeletal: Nontender with normal range of motion in all extremities. No lower extremity tenderness nor edema. Neurologic:  Normal speech and language. No gross focal neurologic deficits are appreciated.  Skin:  Skin is warm, dry and intact. No rash noted. Psychiatric: Mood and affect are normal. Speech and behavior are normal.  ____________________________________________  ED COURSE:  Pertinent labs & imaging results that were available during my care of the patient were reviewed by me and considered in my medical decision making (see chart for details). Patient is no acute distress, will check basic labs and reevaluate. ____________________________________________     LABS (pertinent positives/negatives)  Labs Reviewed  WET PREP, GENITAL - Abnormal; Notable for the following:    WBC, Wet Prep HPF POC FEW (*)    All other components within normal limits  COMPREHENSIVE METABOLIC PANEL - Abnormal; Notable for the following:    Potassium 3.4 (*)    Glucose, Bld 108 (*)    All other components within normal limits  URINALYSIS COMPLETEWITH MICROSCOPIC ([facility] ONLY) - Abnormal; Notable for the following:    Color, Urine YELLOW (*)    APPearance CLOUDY (*)    Ketones, ur TRACE (*)    Hgb urine dipstick 3+ (*)    Protein, ur 30 (*)    Squamous Epithelial / LPF 6-30 (*)    All other components within normal limits  CHLAMYDIA/NGC RT PCR ([facility] ONLY)  LIPASE, BLOOD  CBC  POC URINE PREG, ED  POCT PREGNANCY, URINE   ____________________________________________  FINAL ASSESSMENT AND PLAN  Abdominal pain, GERD  Plan: Patient with labs as dictated above. Patient is been started on Pepcid and will be referred to GI for follow-up. His also possible she is developing peptic ulcer disease. Labs and testing are otherwise unremarkable.   Earleen Newport, MD   Note: This dictation was prepared with Dragon dictation. Any transcriptional errors that result from this process are unintentional   Earleen Newport, MD 11/24/15 2239  Earleen Newport, MD 11/24/15 2240

## 2015-11-24 NOTE — ED Notes (Signed)
Dr Jimmye Norman at bedside and while talking to pt she also reports vaginal discharge. Pelvic cart brought to room for exam.

## 2015-11-24 NOTE — ED Notes (Signed)

## 2015-11-24 NOTE — ED Notes (Signed)
Pt to triage via wheelchair.  Pt reports abd pain with dysuria and low back pain.  Sx began today.   No vag bleeding.

## 2015-11-27 LAB — URINE CULTURE: SPECIAL REQUESTS: NORMAL

## 2015-12-15 ENCOUNTER — Ambulatory Visit (INDEPENDENT_AMBULATORY_CARE_PROVIDER_SITE_OTHER): Payer: Medicaid Other | Admitting: Family Medicine

## 2015-12-15 ENCOUNTER — Encounter: Payer: Self-pay | Admitting: Family Medicine

## 2015-12-15 VITALS — BP 114/64 | HR 99 | Temp 97.9°F | Resp 16 | Wt 125.0 lb

## 2015-12-15 DIAGNOSIS — G8929 Other chronic pain: Secondary | ICD-10-CM

## 2015-12-15 DIAGNOSIS — R809 Proteinuria, unspecified: Secondary | ICD-10-CM | POA: Insufficient documentation

## 2015-12-15 DIAGNOSIS — R1013 Epigastric pain: Secondary | ICD-10-CM | POA: Diagnosis not present

## 2015-12-15 DIAGNOSIS — R194 Change in bowel habit: Secondary | ICD-10-CM | POA: Diagnosis not present

## 2015-12-15 DIAGNOSIS — R634 Abnormal weight loss: Secondary | ICD-10-CM

## 2015-12-15 MED ORDER — OMEPRAZOLE 20 MG PO CPDR: 20.0000 mg | DELAYED_RELEASE_CAPSULE | Freq: Two times a day (BID) | ORAL | Status: DC

## 2015-12-15 NOTE — Progress Notes (Signed)
BP 114/64 mmHg  Pulse 99  Temp(Src) 97.9 F (36.6 C) (Oral)  Resp 16  Wt 125 lb (56.7 kg)  SpO2 95%   Subjective:    Patient ID: Tiffany Guzman, female    DOB: [birth year], 20 y.o.   MRN: [id number]  HPI: Tiffany Guzman is a 20 y.o. female  Chief Complaint  Patient presents with  . Abdominal Pain    onset 1 year off and on and getting more frequent. epigastric mid abdomen   Patient is new to me as her previous provider has left our practice; she has had abdominal pain on and off for about a year First episode was after heavy drinking; vomiting and had vomiting for 5 hours, dry heaving for hours; that was the first time; went to the ER; pain was unbearable; they checked her out and sent her home; she has had other episodes since, and has been evaluated during the worst episodes xrays were done months ago, 2nd time she was hurting, again after drinking; UTI was found and she was treated for that Another recent episode when she woke up after eating at [employer] and went to [facility]; honey chipotle spicy chicken strips, chips and salsa Was at the beach in [place] and had another episode; started hurting really bad, went to the hospital there, treated here, but she didn't take any of the medicines they gave her (Nexium, sucralfate, and another) Not able to eat; staying hydrated, but not able to eat much because of pain and nausea Wanting to see GI Not sure if tested for H pylori Hasn't felt herself since the last few episodes Lost weight; five pounds No blood in the stools, but she says that her "bowels are so messed up;" either constipated or way too runny; goes days in between sometimes; started working 3rd shift and it's been since then, a month or so Previously, had daily regular morning BMs Had acid reflux around 5th grade, needed tums Grandmother and father and sister all have GERD Taking acid reducer omeprazole and it's helping some; scared to have another episode; 3 or 4  out of 10 pain Quite a bit of stress; worrying about the stomach issues; just started working 3rd shift Burps started smelling like sulfur; foul odor  Reviewed ER note 11/24/15, GERD Reviewed ER note 09/28/15 Protein in urine every single time last several labs Not taking any NSAIDs; not excessive caffeine, just clear sodas  CT Abdomen Pelvis W Contrast   Status: Final result       PACS Images     Show images for CT Abdomen Pelvis W Contrast     Study Result     CLINICAL DATA: Pain, nausea, vomiting, diarrhea. No trauma.  EXAM: CT ABDOMEN AND PELVIS WITH CONTRAST  TECHNIQUE: Multidetector CT imaging of the abdomen and pelvis was performed using the standard protocol following bolus administration of intravenous contrast.  CONTRAST: 127mL OMNIPAQUE IOHEXOL 300 MG/ML SOLN  COMPARISON: None.  FINDINGS: Lower chest: Clear lung bases. Normal heart size.  Hepatobiliary: Normal liver. Normal gallbladder.  Pancreas: Normal.  Spleen: Normal.  Adrenals/Urinary Tract: Normal adrenal glands. Normal kidneys. Normal decompressed bladder. No urolithiasis or obstructive uropathy.  Stomach/Bowel: No bowel dilatation to suggest obstruction. No bowel wall thickening. Normal appendix without periappendiceal inflammatory changes. No pneumoperitoneum, pneumatosis or portal venous gas. No abdominal or pelvic free fluid.  Vascular/Lymphatic: Normal caliber abdominal aorta. No abdominal or pelvic lymphadenopathy.  Reproductive: Normal uterus. No adnexal mass.  Other: No fluid collection  or hematoma.  Musculoskeletal: No acute osseous abnormality. No lytic or sclerotic osseous lesion.  IMPRESSION: 1. No acute abdominal or pelvic pathology.   Electronically Signed  By: Kathreen Devoid  On: 03/25/2015 13:42   Depression screen [facility] 2/9 12/15/2015 08/04/2015 03/15/2015  Decreased Interest 0 0 1  Down, Depressed, Hopeless 1 0 1  PHQ - 2 Score 1 0 2    Altered sleeping - - 3  Tired, decreased energy - - 3  Change in appetite - - 1  Feeling bad or failure about yourself  - - 1  Trouble concentrating - - 3  Moving slowly or fidgety/restless - - 0  Suicidal thoughts - - 0  PHQ-9 Score - - 13   Relevant past medical, surgical, family and social history reviewed Past Medical History  Diagnosis Date  . Chronic upper back pain   . Attention deficit hyperactivity disorder (ADHD)   . Migraine with aura     since elementary school  . GERD (gastroesophageal reflux disease)    Past Surgical History  Procedure Laterality Date  . Insertion of non vaginal contraceptive device     Family History  Problem Relation Age of Onset  . Hypertension Mother   . Hypothyroidism Mother   . Depression Mother   . Diabetes Maternal Grandmother    Maternal great-grandfather pancreatic cancer  Social History  Substance Use Topics  . Smoking status: Never Smoker   . Smokeless tobacco: None  . Alcohol Use: No   Interim medical history since last visit reviewed. Allergies and medications reviewed  Review of Systems Per HPI unless specifically indicated above     Objective:    BP 114/64 mmHg  Pulse 99  Temp(Src) 97.9 F (36.6 C) (Oral)  Resp 16  Wt 125 lb (56.7 kg)  SpO2 95%  Wt Readings from Last 3 Encounters:  12/15/15 125 lb (56.7 kg) (44 %*, Z = -0.16)  11/24/15 125 lb (56.7 kg) (44 %*, Z = -0.16)  10/06/15 137 lb 2 oz (62.199 kg) (65 %*, Z = 0.39)   * Growth percentiles are based on CDC 2-20 Years data.   (patient estimates five pounds of weight loss, not twelve pounds)  Physical Exam  Constitutional: She appears well-developed and well-nourished. No distress.  HENT:  Head: Normocephalic and atraumatic.  Eyes: EOM are normal. No scleral icterus.  Neck: No thyromegaly present.  Cardiovascular: Normal rate, regular rhythm and normal heart sounds.   No murmur heard. Pulmonary/Chest: Effort normal and breath sounds normal. No  respiratory distress. She has no wheezes.  Abdominal: Soft. Bowel sounds are normal. She exhibits no distension and no mass. There is tenderness (epigastric). There is no guarding.  Musculoskeletal: Normal range of motion. She exhibits no edema.  Neurological: She is alert. She exhibits normal muscle tone.  Skin: Skin is warm and dry. She is not diaphoretic. No pallor.  No jaundice  Psychiatric: She has a normal mood and affect. Her behavior is normal. Judgment and thought content normal.   Results for orders placed or performed during the hospital encounter of 11/24/15  Chlamydia/NGC rt PCR ([facility] only)  Result Value Ref Range   Specimen source GC/Chlam VAGINA    Chlamydia Tr NOT DETECTED NOT DETECTED   N gonorrhoeae NOT DETECTED NOT DETECTED  Wet prep, genital  Result Value Ref Range   Yeast Wet Prep HPF POC NONE SEEN NONE SEEN   Trich, Wet Prep NONE SEEN NONE SEEN   Clue Cells Wet Prep HPF POC  NONE SEEN NONE SEEN   WBC, Wet Prep HPF POC FEW (A) NONE SEEN   Sperm NONE SEEN   Urine culture  Result Value Ref Range   Specimen Description URINE, CLEAN CATCH    Special Requests Normal    Culture MULTIPLE SPECIES PRESENT, SUGGEST RECOLLECTION (A)    Report Status 11/27/2015 FINAL   Lipase, blood  Result Value Ref Range   Lipase 23 11 - 51 U/L  Comprehensive metabolic panel  Result Value Ref Range   Sodium 139 135 - 145 mmol/L   Potassium 3.4 (L) 3.5 - 5.1 mmol/L   Chloride 107 101 - 111 mmol/L   CO2 24 22 - 32 mmol/L   Glucose, Bld 108 (H) 65 - 99 mg/dL   BUN 9 6 - 20 mg/dL   Creatinine, Ser 0.81 0.44 - 1.00 mg/dL   Calcium 9.1 8.9 - 10.3 mg/dL   Total Protein 7.7 6.5 - 8.1 g/dL   Albumin 4.5 3.5 - 5.0 g/dL   AST 18 15 - 41 U/L   ALT 14 14 - 54 U/L   Alkaline Phosphatase 75 38 - 126 U/L   Total Bilirubin 0.7 0.3 - 1.2 mg/dL   GFR calc non Af Amer >60 >60 mL/min   GFR calc Af Amer >60 >60 mL/min   Anion gap 8 5 - 15  CBC  Result Value Ref Range   WBC 8.6 3.6 - 11.0 K/uL     RBC 4.85 3.80 - 5.20 MIL/uL   Hemoglobin 13.4 12.0 - 16.0 g/dL   HCT 39.8 35.0 - 47.0 %   MCV 82.1 80.0 - 100.0 fL   MCH 27.5 26.0 - 34.0 pg   MCHC 33.5 32.0 - 36.0 g/dL   RDW 13.1 11.5 - 14.5 %   Platelets 240 150 - 440 K/uL  Urinalysis complete, with microscopic  Result Value Ref Range   Color, Urine YELLOW (A) YELLOW   APPearance CLOUDY (A) CLEAR   Glucose, UA NEGATIVE NEGATIVE mg/dL   Bilirubin Urine NEGATIVE NEGATIVE   Ketones, ur TRACE (A) NEGATIVE mg/dL   Specific Gravity, Urine 1.021 1.005 - 1.030   Hgb urine dipstick 3+ (A) NEGATIVE   pH 5.0 5.0 - 8.0   Protein, ur 30 (A) NEGATIVE mg/dL   Nitrite NEGATIVE NEGATIVE   Leukocytes, UA NEGATIVE NEGATIVE   RBC / HPF 6-30 0 - 5 RBC/hpf   WBC, UA 6-30 0 - 5 WBC/hpf   Bacteria, UA NONE SEEN NONE SEEN   Squamous Epithelial / LPF 6-30 (A) NONE SEEN   Mucous PRESENT    Ca Oxalate Crys, UA PRESENT   Pregnancy, urine POC  Result Value Ref Range   Preg Test, Ur NEGATIVE NEGATIVE      Assessment & Plan:   Problem List Items Addressed This Visit      Other   Abdominal pain, chronic, epigastric - Primary    Going on for a year on and off; started after excessive drinking; denies sx suggestive of mallory-weiss tear; ct scan reviewed, labs reviewed; weight loss noted, but patient thinks only five pounds, not twelve pounds, as I suspect ER visits were not measured; regardless, needs EGD to evaluate for possible gastritis, duodenitis; increase omeprazole to BID; refer to GI      Relevant Medications   omeprazole (PRILOSEC) 20 MG capsule   Other Relevant Orders   Ambulatory referral to Gastroenterology   Change in bowel habits    Along with epigastric pain; no visible blood; multiple ER  visits; refer to GI      Relevant Orders   Ambulatory referral to Gastroenterology   Proteinuria    Noted on the last 3 urines collected during her ER work-ups; will start with 24 hour urine collection for protein; reviewed CT scan done in  September, no renal pathology noted; close f/u      Relevant Orders   Protein, Urine, 24 hour   Weight loss    Five pounds weight loss per patient; other entered weights in the [facility] system not likely measured on scales; she sounds to be wary of eating, expecting symptom recurrence, pain; will refer to GI to evaluate for ulcer, gastritis, duodenitis, etc, but also r/o malignancy         Follow up plan: Return in about 3 weeks (around 01/05/2016) for follow-up.  An after-visit summary was printed and given to the patient at [place].  Please see the patient instructions which may contain other information and recommendations beyond what is mentioned above in the assessment and plan.  Meds ordered this encounter  Medications  . omeprazole (PRILOSEC) 20 MG capsule    Sig: Take 1 capsule (20 mg total) by mouth 2 (two) times daily before a meal.    Dispense:  60 capsule    Refill:  3    Pt will call when needed    Orders Placed This Encounter  Procedures  . Protein, Urine, 24 hour  . Ambulatory referral to Gastroenterology

## 2015-12-15 NOTE — Patient Instructions (Signed)
We'll refer you to the gastoenterologist Avoid all the triggers Take omeprazole twice a day Collect a 24 hour urine protein ([facility])

## 2015-12-17 DIAGNOSIS — R634 Abnormal weight loss: Secondary | ICD-10-CM | POA: Insufficient documentation

## 2015-12-17 NOTE — Assessment & Plan Note (Signed)
Along with epigastric pain; no visible blood; multiple ER visits; refer to GI

## 2015-12-17 NOTE — Assessment & Plan Note (Signed)
Going on for a year on and off; started after excessive drinking; denies sx suggestive of mallory-weiss tear; ct scan reviewed, labs reviewed; weight loss noted, but patient thinks only five pounds, not twelve pounds, as I suspect ER visits were not measured; regardless, needs EGD to evaluate for possible gastritis, duodenitis; increase omeprazole to BID; refer to GI

## 2015-12-17 NOTE — Assessment & Plan Note (Signed)
Noted on the last 3 urines collected during her ER work-ups; will start with 24 hour urine collection for protein; reviewed CT scan done in September, no renal pathology noted; close f/u

## 2015-12-17 NOTE — Assessment & Plan Note (Signed)
Five pounds weight loss per patient; other entered weights in the [facility] system not likely measured on scales; she sounds to be wary of eating, expecting symptom recurrence, pain; will refer to GI to evaluate for ulcer, gastritis, duodenitis, etc, but also r/o malignancy

## 2016-01-02 ENCOUNTER — Encounter: Payer: Self-pay | Admitting: Medical Oncology

## 2016-01-02 ENCOUNTER — Emergency Department
Admission: EM | Admit: 2016-01-02 | Discharge: 2016-01-02 | Disposition: A | Discharge status: Home / Self Care | Payer: Medicaid Other | Attending: Student | Admitting: Student

## 2016-01-02 DIAGNOSIS — R112 Nausea with vomiting, unspecified: Secondary | ICD-10-CM

## 2016-01-02 DIAGNOSIS — R1013 Epigastric pain: Secondary | ICD-10-CM | POA: Diagnosis present

## 2016-01-02 DIAGNOSIS — F909 Attention-deficit hyperactivity disorder, unspecified type: Secondary | ICD-10-CM | POA: Insufficient documentation

## 2016-01-02 DIAGNOSIS — Z8719 Personal history of other diseases of the digestive system: Secondary | ICD-10-CM | POA: Insufficient documentation

## 2016-01-02 DIAGNOSIS — Z79899 Other long term (current) drug therapy: Secondary | ICD-10-CM | POA: Insufficient documentation

## 2016-01-02 DIAGNOSIS — F1290 Cannabis use, unspecified, uncomplicated: Secondary | ICD-10-CM | POA: Diagnosis not present

## 2016-01-02 LAB — CBC WITH DIFFERENTIAL/PLATELET
BASOS ABS: 0 10*3/uL (ref 0–0.1)
Eosinophils Absolute: 0 10*3/uL (ref 0–0.7)
Eosinophils Relative: 0 %
HEMATOCRIT: 45.5 % (ref 35.0–47.0)
HEMOGLOBIN: 14.9 g/dL (ref 12.0–16.0)
Lymphs Abs: 1.3 10*3/uL (ref 1.0–3.6)
MCH: 27 pg (ref 26.0–34.0)
MCHC: 32.8 g/dL (ref 32.0–36.0)
MCV: 82.3 fL (ref 80.0–100.0)
Monocytes Absolute: 0.5 10*3/uL (ref 0.2–0.9)
NEUTROS ABS: 10.2 10*3/uL — AB (ref 1.4–6.5)
Platelets: 343 10*3/uL (ref 150–440)
RBC: 5.52 MIL/uL — ABNORMAL HIGH (ref 3.80–5.20)
RDW: 12.9 % (ref 11.5–14.5)
WBC: 12.1 10*3/uL — ABNORMAL HIGH (ref 3.6–11.0)

## 2016-01-02 LAB — COMPREHENSIVE METABOLIC PANEL
ALBUMIN: 5.7 g/dL — AB (ref 3.5–5.0)
ALK PHOS: 97 U/L (ref 38–126)
ALT: 16 U/L (ref 14–54)
AST: 34 U/L (ref 15–41)
Anion gap: 18 — ABNORMAL HIGH (ref 5–15)
BILIRUBIN TOTAL: 0.9 mg/dL (ref 0.3–1.2)
BUN: 10 mg/dL (ref 6–20)
CALCIUM: 10.6 mg/dL — AB (ref 8.9–10.3)
CO2: 19 mmol/L — ABNORMAL LOW (ref 22–32)
Chloride: 105 mmol/L (ref 101–111)
Creatinine, Ser: 0.99 mg/dL (ref 0.44–1.00)
GFR calc Af Amer: >60 mL/min (ref >60)
GFR calc non Af Amer: >60 mL/min (ref >60)
GLUCOSE: 135 mg/dL — AB (ref 65–99)
Potassium: 4.7 mmol/L (ref 3.5–5.1)
Sodium: 142 mmol/L (ref 135–145)
TOTAL PROTEIN: 9.3 g/dL — AB (ref 6.5–8.1)

## 2016-01-02 LAB — HCG, QUANTITATIVE, PREGNANCY: hCG, Beta Chain, Quant, S: 1 m[IU]/mL (ref <5)

## 2016-01-02 LAB — LIPASE, BLOOD: LIPASE: 23 U/L (ref 11–51)

## 2016-01-02 MED ORDER — ONDANSETRON HCL 4 MG/2ML IJ SOLN
4.0000 mg | Freq: Once | INTRAMUSCULAR | Status: AC
Start: 2016-01-02 — End: 2016-01-02
  Administered 2016-01-02: 4 mg via INTRAVENOUS
  Filled 2016-01-02: qty 2

## 2016-01-02 MED ORDER — SODIUM CHLORIDE 0.9 % IV BOLUS (SEPSIS)
1000.0000 mL | Freq: Once | INTRAVENOUS | Status: AC
  Administered 2016-01-02: 1000 mL via INTRAVENOUS

## 2016-01-02 MED ORDER — KETOROLAC TROMETHAMINE 30 MG/ML IJ SOLN
15.0000 mg | Freq: Once | INTRAMUSCULAR | Status: AC
  Administered 2016-01-02: 15 mg via INTRAVENOUS
  Filled 2016-01-02: qty 1

## 2016-01-02 MED ORDER — ONDANSETRON 4 MG PO TBDP: 4.0000 mg | ORAL_TABLET | Freq: Three times a day (TID) | ORAL | Status: DC | PRN

## 2016-01-02 MED ORDER — SODIUM CHLORIDE 0.9 % IV BOLUS (SEPSIS)
1000.0000 mL | Freq: Once | INTRAVENOUS | Status: AC
Start: 2016-01-02 — End: 2016-01-02
  Administered 2016-01-02: 1000 mL via INTRAVENOUS

## 2016-01-02 MED ORDER — MORPHINE SULFATE (PF) 2 MG/ML IV SOLN
2.0000 mg | Freq: Once | INTRAVENOUS | Status: AC
  Administered 2016-01-02: 2 mg via INTRAVENOUS
  Filled 2016-01-02: qty 1

## 2016-01-02 MED ORDER — HALOPERIDOL LACTATE 5 MG/ML IJ SOLN
5.0000 mg | Freq: Once | INTRAMUSCULAR | Status: AC
  Administered 2016-01-02: 5 mg via INTRAMUSCULAR
  Filled 2016-01-02: qty 1

## 2016-01-02 NOTE — ED Provider Notes (Signed)
[facility] Emergency Department Provider Note   ____________________________________________  Time seen: Approximately 11:20 AM  I have reviewed the triage vital signs and the nursing notes.   HISTORY  Chief Complaint Abdominal Pain and Nausea    HPI Jeweliana Lon Borell is a 20 y.o. female history of GERD, migraines, ADHD, several episodes of intractable nausea, vomiting and abdominal pain over the past year who presents for evaluation of diffuse abdominal pain, multiple episodes of nonbloody nonbilious emesis today, similar to her prior flares, severe, no modifying factors. No chest pain or difficulty breathing. No fevers. She is scheduled to follow up with GI in the next month for EGD. She reports that this has happened recurrently 6 times over the past year. Of note, she does smoke marijuana daily and reports that it is helpful for her symptoms.   Past Medical History  Diagnosis Date  . Chronic upper back pain   . Attention deficit hyperactivity disorder (ADHD)   . Migraine with aura     since elementary school  . GERD (gastroesophageal reflux disease)     Patient Active Problem List   Diagnosis Date Noted  . Weight loss 12/17/2015  . Abdominal pain, chronic, epigastric 12/15/2015  . Change in bowel habits 12/15/2015  . Proteinuria 12/15/2015  . Tinea cruris 10/06/2015  . Chronic generalized abdominal pain 03/30/2015  . Chronic diarrhea 03/30/2015  . Chronic lumbar pain 03/16/2015  . Implantable subdermal contraceptive surveillance 03/16/2015    Past Surgical History  Procedure Laterality Date  . Insertion of non vaginal contraceptive device      Current Outpatient Rx  Name  Route  Sig  Dispense  Refill  . omeprazole (PRILOSEC) 20 MG capsule   Oral   Take 1 capsule (20 mg total) by mouth 2 (two) times daily before a meal.   60 capsule   3     Pt will call when needed   . ondansetron (ZOFRAN ODT) 4 MG disintegrating tablet    Oral   Take 1 tablet (4 mg total) by mouth every 8 (eight) hours as needed for nausea or vomiting.   12 tablet   0     Allergies Review of patient's allergies indicates no known allergies.  Family History  Problem Relation Age of Onset  . Hypertension Mother   . Hypothyroidism Mother   . Depression Mother   . Diabetes Maternal Grandmother     Social History Social History  Substance Use Topics  . Smoking status: Never Smoker   . Smokeless tobacco: None  . Alcohol Use: No    Review of Systems Constitutional: No fever/chills Eyes: No visual changes. ENT: No sore throat. Cardiovascular: Denies chest pain. Respiratory: Denies shortness of breath. Gastrointestinal: + abdominal pain.  + nausea, + vomiting.  No diarrhea.  No constipation. Genitourinary: Negative for dysuria. Musculoskeletal: Negative for back pain. Skin: Negative for rash. Neurological: Negative for headaches, focal weakness or numbness.  10-point ROS otherwise negative.  ____________________________________________   PHYSICAL EXAM:  Filed Vitals:   01/02/16 1330 01/02/16 1400 01/02/16 1430 01/02/16 1500  BP: 138/92 127/98 124/65 114/79  Pulse: 85 93 59 60  Temp:      TempSrc:      Resp: 11 14 19 14   Height:      Weight:      SpO2: 100% 100% 99% 100%    VITAL SIGNS: ED Triage Vitals  Enc Vitals Group     BP 01/02/16 1113 141/84 mmHg  Pulse Rate 01/02/16 1113 120     Resp 01/02/16 1113 22     Temp 01/02/16 1113 97.5 F (36.4 C)     Temp Source 01/02/16 1113 Oral     SpO2 01/02/16 1113 100 %     Weight 01/02/16 1113 120 lb (54.432 kg)     Height 01/02/16 1113 5\' 4"  (1.626 m)     Head Cir --      Peak Flow --      Pain Score 01/02/16 1114 10     Pain Loc --      Pain Edu? --      Excl. in [place]? --     Constitutional: Alert and oriented. In distress secondary to vomiting, actively retching. Eyes: Conjunctivae are normal. PERRL. EOMI. Head: Atraumatic. Nose: No  congestion/rhinnorhea. Mouth/Throat: Mucous membranes are moist.  Oropharynx non-erythematous. Neck: No stridor. Supple without meningismus. Cardiovascular: Tachycardic rate, regular rhythm. Grossly normal heart sounds.  Good peripheral circulation. Respiratory: Normal respiratory effort.  No retractions. Lungs CTAB. Gastrointestinal: Soft and nontender. No distention. No CVA tenderness. Genitourinary: deferred Musculoskeletal: No lower extremity tenderness nor edema.  No joint effusions. Neurologic:  Normal speech and language. No gross focal neurologic deficits are appreciated. No gait instability. Skin:  Skin is warm, dry and intact. No rash noted. Psychiatric: Mood and affect are normal. Speech and behavior are normal.  ____________________________________________   LABS (all labs ordered are listed, but only abnormal results are displayed)  Labs Reviewed  CBC WITH DIFFERENTIAL/PLATELET - Abnormal; Notable for the following:    WBC 12.1 (*)    RBC 5.52 (*)    Neutro Abs 10.2 (*)    All other components within normal limits  COMPREHENSIVE METABOLIC PANEL - Abnormal; Notable for the following:    CO2 19 (*)    Glucose, Bld 135 (*)    Calcium 10.6 (*)    Total Protein 9.3 (*)    Albumin 5.7 (*)    Anion gap 18 (*)    All other components within normal limits  LIPASE, BLOOD  HCG, QUANTITATIVE, PREGNANCY   ____________________________________________  EKG  ED ECG REPORT I, Joanne Gavel, the attending physician, personally viewed and interpreted this ECG.   Date: 01/02/2016  EKG Time: 11:32  Rate: 129  Rhythm: sinus tachycardia  Axis: normal  Intervals:none  ST&T Change: No acute ST elevation.  ____________________________________________  RADIOLOGY  none ____________________________________________   PROCEDURES  Procedure(s) performed: None  Critical Care performed: No  ____________________________________________   INITIAL IMPRESSION / ASSESSMENT  AND PLAN / ED COURSE  Pertinent labs & imaging results that were available during my care of the patient were reviewed by me and considered in my medical decision making (see chart for details).  Melitza Lon Warhurst is a 20 y.o. female history of GERD, migraines, ADHD, several episodes of intractable nausea, vomiting and abdominal pain over the past year who presents for evaluation of diffuse abdominal pain, multiple episodes of nonbloody nonbilious emesis today, similar to her prior flares. On exam, she is in distress, actively retching. Vital signs are notable for mild tachycardia and tachypnea, she is afebrile. Since that recurrent cyclical vomiting, possibly cannabis hyperemesis syndrome, we'll treat her symptomatically, obtain screening labs, urinalysis, reassess for disposition.  ----------------------------------------- 3:12 PM on 01/02/2016 ----------------------------------------- I review the patient's labs, CBC with mild leukocytosis, white blood cell count is 12.1. CMP shows evidence of dehydration, her glucose is only mildly elevated at 135, CO2 19 which I suspect is related to  dehydration and does not meet criteria for DKA. Normal lipase, she is not pregnant. Her symptoms have significantly improved after morphine, Toradol, Zofran as well as Haldol. I suspect that her symptoms could be related to cannabis hyperemesis syndrome. I discussed this with her and I encouraged her to stop smoking marijuana. She is sitting up in bed, watching a video on her smart phone, and drinking from a cup of  water at the bedside without vomiting. She looks much better. I discussed return precautions, need for close PCP and GI follow-up and she is comfortable with the discharge plan. She denies any urinary complaints so I have canceled the urinalysis at this time. Her vital signs have normalized at the time of discharge.  ____________________________________________   FINAL CLINICAL IMPRESSION(S) / ED  DIAGNOSES  Final diagnoses:  Epigastric pain  Non-intractable vomiting with nausea, vomiting of unspecified type      NEW MEDICATIONS STARTED DURING THIS VISIT:  Discharge Medication List as of 01/02/2016  3:17 PM    START taking these medications   Details  ondansetron (ZOFRAN ODT) 4 MG disintegrating tablet Take 1 tablet (4 mg total) by mouth every 8 (eight) hours as needed for nausea or vomiting., Starting 01/02/2016, Until Discontinued, Print         Note:  This document was prepared using Dragon voice recognition software and may include unintentional dictation errors.    Joanne Gavel, MD 01/02/16 2055

## 2016-01-02 NOTE — ED Notes (Signed)
Pt reports she began having generalized abd pain last night and this am began having n/v/d. Pt reports this has been an off and on issue for 1 year. Supposed to see GI specialist end of this month.

## 2016-01-02 NOTE — ED Notes (Signed)
Pt attempted to provide urine specimen; pt unable to urinate at this time.

## 2016-01-02 NOTE — ED Notes (Signed)
Pt in via triage with complaints of intermittent abdominal pain w/ nausea/vomiting x 1 year.  Pt reports this episode began last night with the abdominal pain, vomiting started early this morning around 0300.  Pt A/Ox4, dry heaving upon arrival to room, with tremors in bilateral upper and lower extremities.  MD at bedside.

## 2016-01-08 ENCOUNTER — Ambulatory Visit: Payer: Medicaid Other | Admitting: Family Medicine

## 2016-01-15 ENCOUNTER — Encounter: Payer: Self-pay | Admitting: Family Medicine

## 2016-01-15 ENCOUNTER — Ambulatory Visit: Payer: Medicaid Other | Admitting: Gastroenterology

## 2016-01-15 DIAGNOSIS — Z91199 Patient's noncompliance with other medical treatment and regimen due to unspecified reason: Secondary | ICD-10-CM | POA: Insufficient documentation

## 2016-01-15 DIAGNOSIS — Z9119 Patient's noncompliance with other medical treatment and regimen: Secondary | ICD-10-CM | POA: Insufficient documentation

## 2016-06-24 ENCOUNTER — Ambulatory Visit (INDEPENDENT_AMBULATORY_CARE_PROVIDER_SITE_OTHER): Payer: Medicaid Other | Admitting: Family Medicine

## 2016-06-24 DIAGNOSIS — N39490 Overflow incontinence: Secondary | ICD-10-CM

## 2016-06-24 DIAGNOSIS — N644 Mastodynia: Secondary | ICD-10-CM | POA: Diagnosis not present

## 2016-06-24 DIAGNOSIS — R634 Abnormal weight loss: Secondary | ICD-10-CM | POA: Diagnosis not present

## 2016-06-24 DIAGNOSIS — F419 Anxiety disorder, unspecified: Secondary | ICD-10-CM | POA: Diagnosis not present

## 2016-06-24 DIAGNOSIS — R32 Unspecified urinary incontinence: Secondary | ICD-10-CM | POA: Insufficient documentation

## 2016-06-24 LAB — CBC WITH DIFFERENTIAL/PLATELET
BASOS ABS: 0 {cells}/uL (ref 0–200)
Basophils Relative: 0 %
EOS ABS: 87 {cells}/uL (ref 15–500)
Eosinophils Relative: 1 %
HEMATOCRIT: 41.2 % (ref 35.0–45.0)
HEMOGLOBIN: 13.6 g/dL (ref 11.7–15.5)
LYMPHS ABS: 2262 {cells}/uL (ref 850–3900)
Lymphocytes Relative: 26 %
MCH: 27.9 pg (ref 27.0–33.0)
MCHC: 33 g/dL (ref 32.0–36.0)
MCV: 84.6 fL (ref 80.0–100.0)
MONO ABS: 870 {cells}/uL (ref 200–950)
MPV: 10.8 fL (ref 7.5–12.5)
Monocytes Relative: 10 %
NEUTROS PCT: 63 %
Neutro Abs: 5481 cells/uL (ref 1500–7800)
Platelets: 231 10*3/uL (ref 140–400)
RBC: 4.87 MIL/uL (ref 3.80–5.10)
RDW: 13.7 % (ref 11.0–15.0)
WBC: 8.7 10*3/uL (ref 3.8–10.8)

## 2016-06-24 MED ORDER — SERTRALINE HCL 50 MG PO TABS: ORAL_TABLET | ORAL | 0 refills | Status: DC

## 2016-06-24 NOTE — Assessment & Plan Note (Signed)
Check urine.

## 2016-06-24 NOTE — Patient Instructions (Signed)
Let's start the sertraline Call before the next appointment if any problems Let's get labs today If you have not heard anything from my staff in a week about any orders/referrals/studies from today, please contact us here to follow-up [phone] Please call to schedule an appointment with the counselor of your choice  12 Ways to Curb Anxiety  ?Anxiety is normal human sensation. It is what helped our ancestors survive the pitfalls of the wilderness. Anxiety is defined as experiencing worry or nervousness about an imminent event or something with an uncertain outcome. It is a feeling experienced by most people at some point in their lives. Anxiety can be triggered by a very personal issue, such as the illness of a loved one, or an event of global proportions, such as a refugee crisis. Some of the symptoms of anxiety are:  Feeling restless.  Having a feeling of impending danger.  Increased heart rate.  Rapid breathing. Sweating.  Shaking.  Weakness or feeling tired.  Difficulty concentrating on anything except the current worry.  Insomnia.  Stomach or bowel problems. What can we do about anxiety we may be feeling? There are many techniques to help manage stress and relax. Here are 12 ways you can reduce your anxiety almost immediately: 1. Turn off the constant feed of information. Take a social media sabbatical. Studies have shown that social media directly contributes to social anxiety.  2. Monitor your television viewing habits. Are you watching shows that are also contributing to your anxiety, such as 24-hour news stations? Try watching something else, or better yet, nothing at all. Instead, listen to music, read an inspirational book or practice a hobby. 3. Eat nutritious meals. Also, don't skip meals and keep healthful snacks on hand. Hunger and poor diet contributes to feeling anxious. 4. Sleep. Sleeping on a regular schedule for at least seven to eight hours a night will do wonders for  your outlook when you are awake. 5. Exercise. Regular exercise will help rid your body of that anxious energy and help you get more restful sleep. 6. Try deep (diaphragmatic) breathing. Inhale slowly through your nose for five seconds and exhale through your mouth. 7. Practice acceptance and gratitude. When anxiety hits, accept that there are things out of your control that shouldn't be of immediate concern.  8. Seek out humor. When anxiety strikes, watch a funny video, read jokes or call a friend who makes you laugh. Laughter is healing for our bodies and releases endorphins that are calming. 9. Stay positive. Take the effort to replace negative thoughts with positive ones. Try to see a stressful situation in a positive light. Try to come up with solutions rather than dwelling on the problem. 10. Figure out what triggers your anxiety. Keep a journal and make note of anxious moments and the events surrounding them. This will help you identify triggers you can avoid or even eliminate. 11. Talk to someone. Let a trusted friend, family member or even trained professional know that you are feeling overwhelmed and anxious. Verbalize what you are feeling and why.  12. Volunteer. If your anxiety is triggered by a crisis on a large scale, become an advocate and work to resolve the problem that is causing you unease. Anxiety is often unwelcome and can become overwhelming. If not kept in check, it can become a disorder that could require medical treatment. However, if you take the time to care for yourself and avoid the triggers that make you anxious, you will be able  to find moments of relaxation and clarity that make your life much more enjoyable.

## 2016-06-24 NOTE — Assessment & Plan Note (Signed)
Check tsh, free t4, celiac panel, other labs

## 2016-06-24 NOTE — Assessment & Plan Note (Signed)
Encouraged counseling, and start SSRI; close f/u

## 2016-06-24 NOTE — Progress Notes (Signed)
BP 110/70 (BP Location: Left Arm)   Pulse 85   Temp 98.1 F (36.7 C)   Resp 16   Wt 113 lb 5 oz (51.4 kg)   LMP 06/06/2016   SpO2 97%   BMI 19.45 kg/m    Subjective:    Patient ID: Tiffany Guzman, female    DOB: 03-09-1996, 20 y.o.   MRN: [id number]  HPI: Tiffany Guzman is a 20 y.o. female  Chief Complaint  Patient presents with  . Anxiety  . Depression  . Eating Disorder    Vomit after eating; not keeping anything down. Crackers onlything thats hold  . Flank Pain    Patient had side pain ; Poss kidney    She had GI symptoms; those symptoms are gone; acid reflux; I saw her months ago and referred her to GI, but she never went She has throwing up with her eating disorder; she does not want to say anorexia or bulemia; she is not making herself throw up; may be a mind game She would eat a meal and then would feel fat and would feel sick; she would think "fat" and then would throw up and would be good She is not working with a counselor She is having depression and anxiety She is not sure if having a panic attack; her adrenaline builds up and wants to swing her arms and punch and makes her body just convulsed, just gets so mad; nothing to be mad over; this has been going on 2-3 weeks ago, at her grandmother's house, got upset over the littlest thing; got upset and went into full rage; not sure if anxiety, just doesn't show it; she says maybe she has been like this for a long time; in elementary school, would go into full rage even when small; would pull her own hair; would just "freak out" and cry, the whole nine yards; she doesn't know if she's bipolar; has to do with her being down on herself and down on her life; started with first bad relationship; no hx of abuse She says she got into counseling/therapy group a long time ago; didn't work for her She was on acid reflux but no SSRIs for her abdomen or mood Lots of anxiety in the family; some depression as well; no eating  disorders Having some issues with urinary incontinence Had birth control taken out a month ago; not sure if hormonal; nexplanon removed; had it for 3 years; might be pregnant; breast tenderness Right hip goes numb at times when laying on it  Depression screen [facility] 2/9 06/24/2016 12/15/2015 08/04/2015 03/15/2015  Decreased Interest 0 0 0 1  Down, Depressed, Hopeless 0 1 0 1  PHQ - 2 Score 0 1 0 2  Altered sleeping - - - 3  Tired, decreased energy - - - 3  Change in appetite - - - 1  Feeling bad or failure about yourself  - - - 1  Trouble concentrating - - - 3  Moving slowly or fidgety/restless - - - 0  Suicidal thoughts - - - 0  PHQ-9 Score - - - 13   Relevant past medical, surgical, family and social history reviewed Past Medical History:  Diagnosis Date  . Attention deficit hyperactivity disorder (ADHD)   . Chronic upper back pain   . GERD (gastroesophageal reflux disease)   . Migraine with aura    since elementary school   Past Surgical History:  Procedure Laterality Date  . INSERTION OF NON VAGINAL  CONTRACEPTIVE DEVICE     Family History  Problem Relation Age of Onset  . Hypertension Mother   . Hypothyroidism Mother   . Depression Mother   . Diabetes Maternal Grandmother   mother has thyroid trouble  Social History  Substance Use Topics  . Smoking status: Never Smoker  . Smokeless tobacco: Not on file  . Alcohol use No   Interim medical history since last visit reviewed. Allergies and medications reviewed  Review of Systems Per HPI unless specifically indicated above     Objective:    BP 110/70 (BP Location: Left Arm)   Pulse 85   Temp 98.1 F (36.7 C)   Resp 16   Wt 113 lb 5 oz (51.4 kg)   LMP 06/06/2016   SpO2 97%   BMI 19.45 kg/m   Wt Readings from Last 3 Encounters:  06/24/16 113 lb 5 oz (51.4 kg)  01/02/16 120 lb (54.4 kg) (34 %, Z= -0.42)*  12/15/15 125 lb (56.7 kg) (44 %, Z= -0.16)*   * Growth percentiles are based on CDC 2-20 Years data.      Physical Exam  Constitutional: She appears well-developed and well-nourished. No distress.  HENT:  Head: Normocephalic and atraumatic.  Eyes: EOM are normal. No scleral icterus.  Neck: No thyromegaly present.  Cardiovascular: Normal rate, regular rhythm and normal heart sounds.   No murmur heard. Pulmonary/Chest: Effort normal and breath sounds normal. No respiratory distress. She has no wheezes.  Abdominal: Soft. Bowel sounds are normal. She exhibits no distension and no mass. There is no tenderness (epigastric). There is no guarding.  Musculoskeletal: Normal range of motion. She exhibits no edema.  Neurological: She is alert. She displays no tremor. She exhibits normal muscle tone.  Reflex Scores:      Patellar reflexes are 2+ on the right side and 2+ on the left side. No tics  Skin: Skin is warm and dry. She is not diaphoretic. No pallor.  No jaundice  Psychiatric: She has a normal mood and affect. Her behavior is normal. Judgment and thought content normal.  Good eye contact with examiner      Assessment & Plan:   Problem List Items Addressed This Visit      Other   Weight loss    Check tsh, free t4, celiac panel, other labs      Relevant Orders   CBC with Differential/Platelet (Completed)   Gliadin antibodies, serum (Completed)   Tissue transglutaminase, IgA (Completed)   Reticulin Antibody, IgA w reflex titer (Completed)   COMPLETE METABOLIC PANEL WITH GFR (Completed)   TSH (Completed)   T4, free (Completed)   Urinary incontinence    Check urine      Relevant Orders   Urinalysis w microscopic + reflex cultur (Completed)   Breast tenderness    Check urine hCG      Relevant Orders   POCT urine pregnancy (Completed)   Anxiety    Encouraged counseling, and start SSRI; close f/u         Follow up plan: Return in about 3 weeks (around 07/16/2016) for follow-up.  An after-visit summary was printed and given to the patient at [facility].  Please see the  patient instructions which may contain other information and recommendations beyond what is mentioned above in the assessment and plan.  Orders Placed This Encounter  Procedures  . CBC with Differential/Platelet  . Gliadin antibodies, serum  . Tissue transglutaminase, IgA  . Reticulin Antibody, IgA w reflex titer  .  COMPLETE METABOLIC PANEL WITH GFR  . TSH  . T4, free  . Urinalysis w microscopic + reflex cultur  . POCT urine pregnancy

## 2016-06-24 NOTE — Assessment & Plan Note (Signed)
Check urine hCG

## 2016-06-25 LAB — COMPLETE METABOLIC PANEL WITH GFR
ALT: 9 U/L (ref 6–29)
AST: 12 U/L (ref 10–30)
Albumin: 4.2 g/dL (ref 3.6–5.1)
Alkaline Phosphatase: 57 U/L (ref 33–115)
BILIRUBIN TOTAL: 0.4 mg/dL (ref 0.2–1.2)
BUN: 11 mg/dL (ref 7–25)
CO2: 27 mmol/L (ref 20–31)
Calcium: 9.2 mg/dL (ref 8.6–10.2)
Chloride: 104 mmol/L (ref 98–110)
Creat: 0.68 mg/dL (ref 0.50–1.10)
GFR, Est African American: >89 mL/min (ref >=60)
GLUCOSE: 82 mg/dL (ref 65–99)
POTASSIUM: 3.8 mmol/L (ref 3.5–5.3)
SODIUM: 138 mmol/L (ref 135–146)
TOTAL PROTEIN: 6.8 g/dL (ref 6.1–8.1)

## 2016-06-25 LAB — GLIADIN ANTIBODIES, SERUM
Gliadin IgA: 2 Units (ref <20)
Gliadin IgG: 1 Units (ref <20)

## 2016-06-25 LAB — URINALYSIS W MICROSCOPIC + REFLEX CULTURE
BILIRUBIN URINE: NEGATIVE
Casts: NONE SEEN [LPF]
Crystals: NONE SEEN [HPF]
GLUCOSE, UA: NEGATIVE
Hgb urine dipstick: NEGATIVE
KETONES UR: NEGATIVE
Leukocytes, UA: NEGATIVE
NITRITE: NEGATIVE
PH: 8 (ref 5.0–8.0)
Protein, ur: NEGATIVE
SPECIFIC GRAVITY, URINE: 1.02 (ref 1.001–1.035)
Yeast: NONE SEEN [HPF]

## 2016-06-25 LAB — POCT URINE PREGNANCY: PREG TEST UR: NEGATIVE

## 2016-06-25 LAB — TISSUE TRANSGLUTAMINASE, IGA: Tissue Transglutaminase Ab, IgA: 1 U/mL (ref <4)

## 2016-06-25 LAB — TSH: TSH: 1.53 mIU/L

## 2016-06-25 LAB — T4, FREE: Free T4: 1 ng/dL (ref 0.8–1.4)

## 2016-06-25 NOTE — Progress Notes (Signed)
It was negative. I wrote it done to put it in but I see I didn't. Mention to patient the results before she left yesterday.

## 2016-06-26 LAB — RETICULIN ANTIBODIES, IGA W TITER: Reticulin Ab, IgA: NEGATIVE

## 2016-06-28 ENCOUNTER — Other Ambulatory Visit: Payer: Self-pay | Admitting: Family Medicine

## 2016-06-28 MED ORDER — PAROXETINE HCL 20 MG PO TABS: ORAL_TABLET | ORAL | 0 refills | Status: DC

## 2016-06-28 NOTE — Progress Notes (Signed)
Patient wanted to switch from sertraline to paxil

## 2016-07-16 ENCOUNTER — Ambulatory Visit: Payer: Medicaid Other | Admitting: Family Medicine

## 2017-04-14 ENCOUNTER — Other Ambulatory Visit: Payer: Medicaid Other

## 2017-04-14 ENCOUNTER — Other Ambulatory Visit: Payer: Self-pay | Admitting: Advanced Practice Midwife

## 2017-04-14 ENCOUNTER — Encounter: Payer: Self-pay | Admitting: Advanced Practice Midwife

## 2017-04-14 ENCOUNTER — Ambulatory Visit (INDEPENDENT_AMBULATORY_CARE_PROVIDER_SITE_OTHER): Payer: Medicaid Other | Admitting: Advanced Practice Midwife

## 2017-04-14 ENCOUNTER — Other Ambulatory Visit (INDEPENDENT_AMBULATORY_CARE_PROVIDER_SITE_OTHER): Payer: Medicaid Other

## 2017-04-14 VITALS — BP 114/70 | Wt 120.0 lb

## 2017-04-14 DIAGNOSIS — Z0189 Encounter for other specified special examinations: Secondary | ICD-10-CM

## 2017-04-14 DIAGNOSIS — Z124 Encounter for screening for malignant neoplasm of cervix: Secondary | ICD-10-CM

## 2017-04-14 DIAGNOSIS — Z113 Encounter for screening for infections with a predominantly sexual mode of transmission: Secondary | ICD-10-CM

## 2017-04-14 DIAGNOSIS — Z3400 Encounter for supervision of normal first pregnancy, unspecified trimester: Secondary | ICD-10-CM | POA: Diagnosis not present

## 2017-04-14 NOTE — Progress Notes (Signed)
New Obstetric Patient H&P    Chief Complaint: "Desires prenatal care"   History of Present Illness: Patient is a 21 y.o. G1P0 Not Hispanic or [place] female, LMP 7/272018 presents with amenorrhea and positive home pregnancy test. Based on her  LMP, her EDD is Estimated Date of Delivery: 11/21/17 and her EGA is [redacted]w[redacted]d. Cycles are 6. days, regular, and occur approximately every : 28 days. Her last pap smear was 5 years ago and was no abnormalities.    She had a urine pregnancy test which was positive 3 week(s)  ago. Her last menstrual period was normal and lasted for  5 or 6 day(s). Since her LMP she claims she has experienced breast tenderness, fatigue, nausea, vomiting. She denies vaginal bleeding. Her past medical history is noncontributory. Her prior pregnancies are notable for none. She is a G1P0  Since her LMP, she admits to the use of tobacco products  no She claims she has gained   5 pounds since the start of her pregnancy.  There are cats in the home in the home  no  She admits close contact with children on a regular basis  yes  She has had chicken pox in the past unknown She has had Tuberculosis exposures, symptoms, or previously tested positive for TB   no Current or past history of domestic violence. no  Genetic Screening/Teratology Counseling: (Includes patient, baby's father, or anyone in either family with:)   11. Patient's age >/= 63 at [facility]  no 2. Thalassemia ([place], [place], [place], or Asian background): MCV<80  no 3. Neural tube defect (meningomyelocele, spina bifida, anencephaly)  no 4. Congenital heart defect  no  5. Down syndrome  no 6. Tay-Sachs (Jewish, [place])  no 7. Canavan's Disease  no 8. Sickle cell disease or trait (African)  no  9. Hemophilia or other blood disorders  no  10. Muscular dystrophy  no  11. Cystic fibrosis  no  12. Huntington's Chorea  no  13. Mental retardation/autism  no 14. Other inherited genetic or chromosomal disorder   no 15. Maternal metabolic disorder (DM, PKU, etc)  no 16. Patient or FOB with a child with a birth defect not listed above no  16a. Patient or FOB with a birth defect themselves no 17. Recurrent pregnancy loss, or stillbirth  no  18. Any medications since LMP other than prenatal vitamins (include vitamins, supplements, OTC meds, drugs, alcohol)  no 19. Any other genetic/environmental exposure to discuss  no  Infection History:   1. Lives with someone with TB or TB exposed  no  2. Patient or partner has history of genital herpes  no 3. Rash or viral illness since LMP  no 4. History of STI (GC, CT, HPV, syphilis, HIV)  no 5. History of recent travel :  no  Other pertinent information:  no     Review of Systems:10 point review of systems negative unless otherwise noted in HPI  Past Medical History:  Past Medical History:  Diagnosis Date  . Attention deficit hyperactivity disorder (ADHD)   . Chronic upper back pain   . GERD (gastroesophageal reflux disease)   . Migraine with aura    since elementary school    Past Surgical History:  Past Surgical History:  Procedure Laterality Date  . INSERTION OF NON VAGINAL CONTRACEPTIVE DEVICE      Gynecologic History: Patient's last menstrual period was 02/14/2017.  Obstetric History: G1P0  Family History:  Family History  Problem Relation Age of Onset  .  Hypertension Mother   . Hypothyroidism Mother   . Depression Mother   . Diabetes Maternal Grandmother     Social History:  Social History   Social History  . Marital status: Single    Spouse name: N/A  . Number of children: N/A  . Years of education: N/A   Occupational History  . Not on file.   Social History Main Topics  . Smoking status: Never Smoker  . Smokeless tobacco: Never Used  . Alcohol use No  . Drug use: Yes    Types: Marijuana  . Sexual activity: Yes    Partners: Male    Birth control/ protection: None   Other Topics Concern  . Not on file    Social History Narrative  . No narrative on file    Allergies:  No Known Allergies  Medications: Prior to Admission medications   Not on File    Physical Exam Vitals: Blood pressure 114/70, weight 120 lb (54.4 kg), last menstrual period 02/14/2017.  General: NAD HEENT: normocephalic, anicteric Thyroid: no enlargement, no palpable nodules Pulmonary: No increased work of breathing, CTAB Cardiovascular: RRR, distal pulses 2+ Abdomen: NABS, soft, non-tender, non-distended.  Umbilicus without lesions.  No hepatomegaly, splenomegaly or masses palpable. No evidence of hernia  Genitourinary:  External: Normal external female genitalia.  Normal urethral meatus, normal  Bartholin's and Skene's glands.    Vagina: Normal vaginal mucosa, no evidence of prolapse.    Cervix: Grossly normal in appearance, no bleeding, no CMT  Uterus: Enlarged, mobile, normal contour.    Adnexa: ovaries non-enlarged, no adnexal masses  Rectal: deferred Extremities: no edema, erythema, or tenderness Neurologic: Grossly intact Psychiatric: mood appropriate, affect full   Assessment: 21 y.o. G1P0 at [redacted]w[redacted]d presenting to initiate prenatal care  Plan: 1) Avoid alcoholic beverages. 2) Patient encouraged not to smoke.  3) Discontinue the use of all non-medicinal drugs and chemicals.  4) Take prenatal vitamins daily.  5) Nutrition, food safety (fish, cheese advisories, and high nitrite foods) and exercise discussed. 6) Hospital and practice style discussed with cross coverage system.  7) Genetic Screening, such as with 1st Trimester Screening, cell free fetal DNA, AFP testing, and Ultrasound, as well as with amniocentesis and CVS as appropriate, is discussed with patient. At the conclusion of today's visit patient declined genetic testing 8) Patient is asked about travel to areas at risk for the [place] virus, and counseled to avoid travel and exposure to mosquitoes or sexual partners who may have themselves been  exposed to the virus. Testing is discussed, and will be ordered as appropriate.   Rod Can, CNM

## 2017-04-14 NOTE — Progress Notes (Signed)
NOB today.  

## 2017-04-14 NOTE — Patient Instructions (Signed)

## 2017-04-16 LAB — RPR+RH+ABO+RUB AB+AB SCR+CB...
ANTIBODY SCREEN: NEGATIVE
HEMATOCRIT: 37.3 % (ref 34.0–46.6)
HEP B S AG: NEGATIVE
HIV SCREEN 4TH GENERATION: NONREACTIVE
Hemoglobin: 12.4 g/dL (ref 11.1–15.9)
MCH: 28.2 pg (ref 26.6–33.0)
MCHC: 33.2 g/dL (ref 31.5–35.7)
MCV: 85 fL (ref 79–97)
PLATELETS: 292 10*3/uL (ref 150–379)
RBC: 4.4 x10E6/uL (ref 3.77–5.28)
RDW: 13 % (ref 12.3–15.4)
RPR: NONREACTIVE
Rh Factor: POSITIVE
Rubella Antibodies, IGG: 1.08 index (ref >0.99)
VARICELLA: 728 {index} (ref >165)
WBC: 13.6 10*3/uL — ABNORMAL HIGH (ref 3.4–10.8)

## 2017-04-16 LAB — IGP,CTNGTV,RFX APTIMA HPV ASCU
CHLAMYDIA, NUC. ACID AMP: NEGATIVE
Gonococcus, Nuc. Acid Amp: NEGATIVE
PAP Smear Comment: 0
TRICH VAG BY NAA: NEGATIVE

## 2017-04-16 LAB — URINE CULTURE: ORGANISM ID, BACTERIA: NO GROWTH

## 2017-04-21 ENCOUNTER — Ambulatory Visit (INDEPENDENT_AMBULATORY_CARE_PROVIDER_SITE_OTHER): Payer: Medicaid Other | Admitting: Advanced Practice Midwife

## 2017-04-21 VITALS — BP 100/60 | Wt 119.0 lb

## 2017-04-21 DIAGNOSIS — O219 Vomiting of pregnancy, unspecified: Secondary | ICD-10-CM

## 2017-04-21 DIAGNOSIS — Z3A09 9 weeks gestation of pregnancy: Secondary | ICD-10-CM

## 2017-04-21 MED ORDER — ONDANSETRON 4 MG PO TBDP: 4.0000 mg | ORAL_TABLET | Freq: Four times a day (QID) | ORAL | 2 refills | Status: DC | PRN

## 2017-04-21 NOTE — Progress Notes (Signed)
The patient is here today for complaint of nausea/vomiting not relieved by [facility] sample that was given at last visit. She is requesting a different medication. She has been vomiting daily since last visit except for the last 2 days she has been able to keep smaller amounts of food/drink down. Comfort measures reviewed and Rx for Zofran ODT sent to pharm.

## 2017-05-12 ENCOUNTER — Ambulatory Visit (INDEPENDENT_AMBULATORY_CARE_PROVIDER_SITE_OTHER): Payer: Medicaid Other | Admitting: Obstetrics and Gynecology

## 2017-05-12 VITALS — BP 112/64 | Wt 115.0 lb

## 2017-05-12 DIAGNOSIS — O219 Vomiting of pregnancy, unspecified: Secondary | ICD-10-CM | POA: Insufficient documentation

## 2017-05-12 DIAGNOSIS — Z3A12 12 weeks gestation of pregnancy: Secondary | ICD-10-CM

## 2017-05-12 DIAGNOSIS — Z3400 Encounter for supervision of normal first pregnancy, unspecified trimester: Secondary | ICD-10-CM

## 2017-05-12 NOTE — Progress Notes (Signed)
  Routine Prenatal Care Visit  Subjective  Tiffany Guzman is a 21 y.o. G1P0 at [redacted]w[redacted]d being seen today for ongoing prenatal care.  She is currently monitored for the following issues for this low-risk pregnancy and has Breast tenderness; Urinary incontinence; Anxiety; Supervision of normal first pregnancy, antepartum; and Nausea and vomiting during pregnancy on her problem list.  ----------------------------------------------------------------------------------- Patient reports nausea and vomiting.  Keep down some fluids. Received benefit from zofran and would like to continue.  . Vag. Bleeding: None.  Movement: Absent. Denies leaking of fluid.  ----------------------------------------------------------------------------------- The following portions of the patient's history were reviewed and updated as appropriate: allergies, current medications, past family history, past medical history, past social history, past surgical history and problem list. Problem list updated.   Objective  Blood pressure 112/64, weight 115 lb (52.2 kg), last menstrual period 02/14/2017. Pregravid weight 120 lb (54.4 kg) Total Weight Gain -5 lb (-2.268 kg) Urinalysis: Urine Protein: Negative Urine Glucose: Negative  Fetal Status: Fetal Heart Rate (bpm): 160   Movement: Absent     General:  Alert, oriented and cooperative. Patient is in no acute distress.  Skin: Skin is warm and dry. No rash noted.   Cardiovascular: Normal heart rate noted  Respiratory: Normal respiratory effort, no problems with respiration noted  Abdomen: Soft, gravid, appropriate for gestational age. Pain/Pressure: Absent     Pelvic:  Cervical exam deferred        Extremities: Normal range of motion.     Mental Status: Normal mood and affect. Normal behavior. Normal judgment and thought content.   Assessment   21 y.o. G1P0 at [redacted]w[redacted]d by  11/21/2017, by Last Menstrual Period presenting for routine prenatal visit  Plan   pregnancy Problems  (from 04/14/17 to present)    Problem Noted Resolved   Nausea and vomiting during pregnancy 05/12/2017 by Will Bonnet, MD No   Supervision of normal first pregnancy, antepartum 04/14/2017 by Rod Can, CNM No   Overview Addendum 05/13/2017  9:54 AM by Will Bonnet, MD    Clinic [facility] Prenatal Labs  Dating L=8 Blood type: B/Positive/-- (09/24 1426)   Genetic Screen 1 Screen: []  ord'd    AFP:     Quad:     NIPS: Antibody:Negative (09/24 1426)  Anatomic [place]  Rubella: 1.08 (09/24 1426) Varicella:    GTT Early:               Third trimester:  RPR: Non Reactive (09/24 1426)   Rhogam  HBsAg: Negative (09/24 1426)   TDaP vaccine                       Flu Shot: HIV:   Negative  Baby Food                                GBS:   Contraception  Pap:          Please refer to After Visit Summary for other counseling recommendations.   Return in about 1 week (around 05/19/2017) for schedule u/s for NT and ROB within 1 week.  Prentice Docker, MD  05/13/2017 9:41 AM

## 2017-05-13 ENCOUNTER — Encounter: Payer: Self-pay | Admitting: Obstetrics and Gynecology

## 2017-05-13 MED ORDER — PROVIDA OB 20-20-1.25 MG PO CAPS: 1.0000 | ORAL_CAPSULE | Freq: Every day | ORAL | 7 refills | Status: AC

## 2017-05-14 ENCOUNTER — Other Ambulatory Visit: Payer: Self-pay

## 2017-05-14 ENCOUNTER — Encounter: Payer: Self-pay | Admitting: Obstetrics and Gynecology

## 2017-05-14 MED ORDER — ONDANSETRON 8 MG PO TBDP: 4.0000 mg | ORAL_TABLET | Freq: Three times a day (TID) | ORAL | 1 refills | Status: DC | PRN

## 2017-05-14 NOTE — Addendum Note (Signed)
Addended by: Prentice Docker D on: 05/14/2017 06:02 PM   Modules accepted: Orders

## 2017-05-20 ENCOUNTER — Ambulatory Visit (INDEPENDENT_AMBULATORY_CARE_PROVIDER_SITE_OTHER): Payer: Medicaid Other | Admitting: Obstetrics and Gynecology

## 2017-05-20 ENCOUNTER — Ambulatory Visit (INDEPENDENT_AMBULATORY_CARE_PROVIDER_SITE_OTHER): Payer: Medicaid Other

## 2017-05-20 VITALS — BP 118/70 | Wt 120.0 lb

## 2017-05-20 DIAGNOSIS — N83202 Unspecified ovarian cyst, left side: Secondary | ICD-10-CM | POA: Insufficient documentation

## 2017-05-20 DIAGNOSIS — Z3A13 13 weeks gestation of pregnancy: Secondary | ICD-10-CM

## 2017-05-20 DIAGNOSIS — Z3400 Encounter for supervision of normal first pregnancy, unspecified trimester: Secondary | ICD-10-CM | POA: Diagnosis not present

## 2017-05-20 DIAGNOSIS — F419 Anxiety disorder, unspecified: Secondary | ICD-10-CM

## 2017-05-20 DIAGNOSIS — Z362 Encounter for other antenatal screening follow-up: Secondary | ICD-10-CM | POA: Diagnosis not present

## 2017-05-20 DIAGNOSIS — Z1379 Encounter for other screening for genetic and chromosomal anomalies: Secondary | ICD-10-CM

## 2017-05-20 DIAGNOSIS — O219 Vomiting of pregnancy, unspecified: Secondary | ICD-10-CM

## 2017-05-20 NOTE — Progress Notes (Signed)
  Routine Prenatal Care Visit  Subjective  Tiffany Guzman is a 21 y.o. G1P0 at [redacted]w[redacted]d being seen today for ongoing prenatal care.  She is currently monitored for the following issues for this low-risk pregnancy and has Breast tenderness; Urinary incontinence; Anxiety; Supervision of normal first pregnancy, antepartum; Nausea and vomiting during pregnancy; and Left ovarian cyst on her problem list.  ----------------------------------------------------------------------------------- Patient reports no complaints.    . Vag. Bleeding: None.  Movement: Absent. Denies leaking of fluid.  Discussed Left ovarian cyst noted on u/s.  Dermoid vs hemorrhagic corpus luteal cyst.  Discussed risk factors for torsion, etc. WIll continue to monitor.  ----------------------------------------------------------------------------------- The following portions of the patient's history were reviewed and updated as appropriate: allergies, current medications, past family history, past medical history, past social history, past surgical history and problem list. Problem list updated.  Objective  Blood pressure 118/70, weight 120 lb (54.4 kg), last menstrual period 02/14/2017. Pregravid weight 120 lb (54.4 kg) Total Weight Gain 0 lb (0 kg) Urinalysis: Urine Protein: Negative Urine Glucose: Negative  Fetal Status: Fetal Heart Rate (bpm): Present   Movement: Absent     General:  Alert, oriented and cooperative. Patient is in no acute distress.  Skin: Skin is warm and dry. No rash noted.   Cardiovascular: Normal heart rate noted  Respiratory: Normal respiratory effort, no problems with respiration noted  Abdomen: Soft, gravid, appropriate for gestational age. Pain/Pressure: Absent     Pelvic:  Cervical exam deferred        Extremities: Normal range of motion.     Mental Status: Normal mood and affect. Normal behavior. Normal judgment and thought content.   Assessment   21 y.o. G1P0 at [redacted]w[redacted]d by  11/21/2017, by  Last Menstrual Period presenting for routine prenatal visit  Plan   pregnancy Problems (from 04/14/17 to present)    Problem Noted Resolved   Left ovarian cyst 05/20/2017 by Will Bonnet, MD No   Nausea and vomiting during pregnancy 05/12/2017 by Will Bonnet, MD No   Supervision of normal first pregnancy, antepartum 04/14/2017 by Rod Can, CNM No   Overview Addendum 05/20/2017  2:05 PM by Will Bonnet, MD    [facility] Prenatal Labs  Dating L=8 Blood type: B/Positive/-- (09/24 1426)   Genetic Screen 1 Screen: []  10/30    AFP:     Quad:     NIPS: Antibody:Negative (09/24 1426)  Anatomic [place]  Rubella: 1.08 (09/24 1426) Varicella:    GTT Early:               Third trimester:  RPR: Non Reactive (09/24 1426)   Rhogam  HBsAg: Negative (09/24 1426)   TDaP vaccine                       Flu Shot: HIV:   Negative  Baby Food                                GBS:   Contraception  Pap:  CBB     CS/VBAC    Support Person            Please refer to After Visit Summary for other counseling recommendations.   Return in about 4 weeks (around 06/17/2017) for Routine Prenatal Appointment.  Prentice Docker, MD  05/20/2017 2:16 PM

## 2017-05-23 LAB — FIRST TRIMESTER SCREEN W/NT
CRL: 71.7 mm
DIA MOM: 1.21
DIA VALUE: 319.3 pg/mL
Gest Age-Collect: 13 weeks
HCG MOM: 0.77
MATERNAL AGE AT EDD: 21.9 a
NUCHAL TRANSLUCENCY: 2.4 mm
NUMBER OF FETUSES: 1
Nuchal Translucency MoM: 1.36
PAPP-A MoM: 0.65
PAPP-A Value: 953.5 ng/mL
TEST RESULTS: NEGATIVE
Weight: 120 [lb_av]
hCG Value: 72.9 IU/mL

## 2017-05-27 ENCOUNTER — Encounter: Payer: Self-pay | Admitting: Obstetrics and Gynecology

## 2017-06-17 ENCOUNTER — Ambulatory Visit (INDEPENDENT_AMBULATORY_CARE_PROVIDER_SITE_OTHER): Payer: Medicaid Other | Admitting: Obstetrics and Gynecology

## 2017-06-17 VITALS — BP 98/58 | Wt 120.0 lb

## 2017-06-17 DIAGNOSIS — Z3A17 17 weeks gestation of pregnancy: Secondary | ICD-10-CM

## 2017-06-17 DIAGNOSIS — N83202 Unspecified ovarian cyst, left side: Secondary | ICD-10-CM

## 2017-06-17 DIAGNOSIS — Z3400 Encounter for supervision of normal first pregnancy, unspecified trimester: Secondary | ICD-10-CM

## 2017-06-17 NOTE — Progress Notes (Signed)
    Routine Prenatal Care Visit  Subjective  Tiffany Guzman is a 21 y.o. G1P0 at [redacted]w[redacted]d being seen today for ongoing prenatal care.  She is currently monitored for the following issues for this low-risk pregnancy and has Breast tenderness; Urinary incontinence; Anxiety; Supervision of normal first pregnancy, antepartum; Nausea and vomiting during pregnancy; and Left ovarian cyst on their problem list.  ----------------------------------------------------------------------------------- Patient reports no pain on left side. Occasional small sharp pain on right with certain movements. Nausea has resolved and patient tolerating meals. Has regained weight lost in first trimester.   Contractions: Not present. Vag. Bleeding: None.  Movement: Absent. Denies leaking of fluid.     Objective   Vitals:   06/17/17 0942  BP: (!) 98/58  Weight: 120 lb (54.4 kg)   Pregravid Weight: 120 lb (54.4 kg)  Total Weight Gain: 0 lb (0 kg)  Urinalysis: Urine Protein: Negative Urine Glucose: Negative  Fetal Status: Fetal Heart Rate (bpm): 155   Movement: Absent     General: Alert: Oriented and cooperative. Patient is in no acute distress. Skin: Skin is warm and dry. No rash noted.  Cardiovascular: Regular rate and rhythm. No murmurs, gallops, or rubs. Respiratory: Normal respiratory effort, no problems with respiration noted Abdomen: Soft, gravid, appropriate for gestational age. Pain/Pressure: Absent Pelvic: Cervical exam deferred       Extremeties: Normal range of motion.    Mental Status: Normal mood and affect. Normal behavior. Normal judgment and thought content.  Assessment   21 y.o. G1P0 at [redacted]w[redacted]d by  11/21/2017, by Last Menstrual Period presenting for routine prenatal visit  Plan   pregnancy Problems (from 04/14/17 to present)    Problem Noted Resolved   Left ovarian cyst 05/20/2017 by Will Bonnet, MD No   Nausea and vomiting during pregnancy 05/12/2017 by Will Bonnet, MD No     Supervision of normal first pregnancy, antepartum 04/14/2017 by Rod Can, CNM No   Overview Addendum 06/17/2017  6:14 AM by Homero Fellers, MD    [facility]inic [facility] Prenatal Labs  Dating L=8 Blood type: B/Positive/-- (09/24 1426)   Genetic Screen 1 Screen: [X]  10/30 negative Antibody:Negative (09/24 1426)  Anatomic [place]  Rubella: 1.08 (09/24 1426) Varicella:    GTT Early:               Third trimester:  RPR: Non Reactive (09/24 1426)   Rhogam  HBsAg: Negative (09/24 1426)   TDaP vaccine                        Flu Shot:DECLINES HIV:   Negative  Baby Food                                GBS:   Contraception  Pap:  CBB     CS/VBAC    Support Person              Preterm labor symptoms and general obstetric precautions including but not limited to vaginal bleeding, contractions, leaking of fluid and fetal movement were reviewed in detail with the patient.  Anatomy US ordered.  Please refer to After Visit Summary for other counseling recommendations.   Return in about 3 weeks (around 07/08/2017) for Anatomy US and [place].  Adrian Prows M.D. 06/17/17 10:25 AM

## 2017-06-17 NOTE — Progress Notes (Signed)
Pt reports no problems. Declines flu shot.

## 2017-06-24 ENCOUNTER — Ambulatory Visit (INDEPENDENT_AMBULATORY_CARE_PROVIDER_SITE_OTHER): Payer: Medicaid Other | Admitting: Family Medicine

## 2017-06-24 ENCOUNTER — Encounter: Payer: Self-pay | Admitting: Family Medicine

## 2017-06-24 ENCOUNTER — Other Ambulatory Visit: Payer: Self-pay

## 2017-06-24 VITALS — BP 98/56 | HR 91 | Temp 98.3°F | Resp 16 | Ht 64.0 in | Wt 121.2 lb

## 2017-06-24 DIAGNOSIS — R05 Cough: Secondary | ICD-10-CM | POA: Diagnosis not present

## 2017-06-24 DIAGNOSIS — R0981 Nasal congestion: Secondary | ICD-10-CM

## 2017-06-24 DIAGNOSIS — J988 Other specified respiratory disorders: Secondary | ICD-10-CM | POA: Diagnosis not present

## 2017-06-24 DIAGNOSIS — R059 Cough, unspecified: Secondary | ICD-10-CM

## 2017-06-24 MED ORDER — LORATADINE 10 MG PO TABS: 10.0000 mg | ORAL_TABLET | Freq: Every day | ORAL | 1 refills | Status: DC

## 2017-06-24 MED ORDER — SALINE SPRAY 0.65 % NA SOLN: 1.0000 | NASAL | 2 refills | Status: DC | PRN

## 2017-06-24 NOTE — Telephone Encounter (Signed)
Copied from [place]. Topic: Inquiry >> Jun 24, 2017  2:38 PM Patrice Paradise wrote: Reason for CRM: Patient would like a call back from [facility], she have a question about RX for nasal spray.   Patient stated that she would like for it to be sent in as a RX so that her insurance would cover it and she prefers for it to be sent to [facility] in [place].

## 2017-06-24 NOTE — Patient Instructions (Addendum)
Saline Nasal Spray or Netti Pot for congestion. May take Claritin once daily.   DRINK PLENTY OF WATER THROUGHOUT THE DAY.  Cool Mist Vaporizer A cool mist vaporizer is a device that releases a cool mist into the air. If you have a cough or a cold, using a vaporizer may help relieve your symptoms. The mist adds moisture to the air, which may help thin your mucus and make it less sticky. When your mucus is thin and less sticky, it easier for you to breathe and to cough up secretions. Do not use a vaporizer if you are allergic to mold. Follow these instructions at home:  Follow the instructions that come with the vaporizer.  Do not use anything other than distilled water in the vaporizer.  Do not run the vaporizer all of the time. Doing that can cause mold or bacteria to grow in the vaporizer.  Clean the vaporizer after each time that you use it.  Clean and dry the vaporizer well before storing it.  Stop using the vaporizer if your breathing symptoms get worse. This information is not intended to replace advice given to you by your health care provider. Make sure you discuss any questions you have with your health care provider. Document Released: 04/04/2004 Document Revised: 01/26/2016 Document Reviewed: 10/07/2015 Elsevier Interactive Patient Education  [employer].

## 2017-06-24 NOTE — Progress Notes (Signed)
Name: Tiffany Guzman   MRN: [id number]    DOB: 1995/09/01   Date:06/24/2017       Progress Note  Subjective  Chief Complaint  Chief Complaint  Patient presents with  . Nasal Congestion    yellowish brown mucus. lots of sneezing.   . Cough    patient presents with dry hacky cough  . Sore Throat    very sore for the past 4-5 days  . Shortness of Breath    patient stated that when she gets up and walks around she becomes winded.  . Dehydration    patient stated that she has not been drinking a lot and had some vomitting  . FYI    patient stated that she has a cyst on her left ovary that is being followed by [facility] G[facility] ([facility])    HPI  Pt presents with 4-5 days of upper respiratory symptoms - nasal congestion, sneezing, dry hacking cough, some right neck lymph node enlargement. Mostly when sitting, it's hard to take a full deep breath, stairs sometimes makes her a little short of breath as well for over a month - also reports breast tenderness which has been ongoing with her pregnancy.  Pt is 18 weeks, 4 days pregnant - is follow by [facility].  She has not been able to drink as much water as she would like due to nausea and vomiting, however in the last several days her nausea has been improving.  HR is elevated upon arrival today, with decrease to 83bpm after sitting in office.  Patient Active Problem List   Diagnosis Date Noted  . Left ovarian cyst 05/20/2017  . Nausea and vomiting during pregnancy 05/12/2017  . Supervision of normal first pregnancy, antepartum 04/14/2017  . Breast tenderness 06/24/2016  . Urinary incontinence 06/24/2016  . Anxiety 06/24/2016    Social History   Tobacco Use  . Smoking status: Never Smoker  . Smokeless tobacco: Never Used  Substance Use Topics  . Alcohol use: No    Alcohol/week: 0.0 oz     Current Outpatient Medications:  .  Prenatal Vit-Fe Fumarate-FA (MULTIVITAMIN-PRENATAL) 27-0.8 MG TABS tablet, Take 1 tablet by mouth daily at  12 noon., Disp: , Rfl:  .  ondansetron (ZOFRAN-ODT) 8 MG disintegrating tablet, Take 0.5 tablets (4 mg total) by mouth every 8 (eight) hours as needed for nausea or vomiting. (Patient not taking: Reported on 06/17/2017), Disp: 30 tablet, Rfl: 1  No Known Allergies  ROS  Constitutional: Negative for fever or weight change.  Respiratory: See HPI Cardiovascular: Negative for chest pain or palpitations.  Gastrointestinal: Negative for abdominal pain, no bowel changes.  Musculoskeletal: Negative for gait problem or joint swelling. Negative for body aches Skin: Negative for rash.  Neurological: Negative for dizziness or headache.  Occasional mild lightheadedness. No other specific complaints in a complete review of systems (except as listed in HPI above).  Objective  Vitals:   06/24/17 0945  BP: (!) 98/56  Pulse: (!) 118  Resp: 16  Temp: 98.3 F (36.8 C)  TempSrc: Oral  SpO2: 99%  Weight: 121 lb 3.2 oz (55 kg)  Height: _0  (1.626 m)   Body mass index is 20.8 kg/m.  Nursing Note and Vital Signs reviewed.  Physical Exam  Constitutional: Patient appears well-developed and well-nourished. No distress.  HEENT: head atraumatic, normocephalic, pupils equal and reactive to light, EOM's intact, TM's without erythema or bulging, no maxillary or frontal sinus pain on palpation, neck supple without lymphadenopathy, oropharynx  mildly erythematous and moist without exudate Cardiovascular: Normal rate, regular rhythm, S1/S2 present.  No murmur or rub heard. No BLE edema. Pulmonary/Chest: Effort normal and breath sounds clear. No respiratory distress or retractions. Abdominal: Gravid.  Psychiatric: Patient has a normal mood and affect. behavior is normal. Judgment and thought content normal.  Recent Results (from the past 2160 hour(s))  Urine Culture     Status: None   Collection Time: 04/14/17  2:16 PM  Result Value Ref Range   Urine Culture, Routine Final report    Organism ID,  Bacteria No growth   IGP,CtNgTv,rfx Aptima HPV ASCU     Status: None   Collection Time: 04/14/17  2:16 PM  Result Value Ref Range   DIAGNOSIS: Comment     Comment: NEGATIVE FOR INTRAEPITHELIAL LESION AND MALIGNANCY.   Specimen adequacy: Comment     Comment: Satisfactory for evaluation. Endocervical and/or squamous metaplastic cells (endocervical component) are present.    Clinician Provided ICD10 Comment     Comment: Z34.00 Z11.3 Z12.4    Performed by: Comment     Comment: Hope Monia Sabal, Cytotechnologist (ASCP)   PAP Smear Comment .    Note: Comment     Comment: The Pap smear is a screening test designed to aid in the detection of premalignant and malignant conditions of the uterine cervix.  It is not a diagnostic procedure and should not be used as the sole means of detecting cervical cancer.  Both false-positive and false-negative reports do occur.    Test Methodology Comment     Comment: This liquid based ThinPrep(R) pap test was screened with the use of an image guided system.    PAP Reflex Comment     Comment: The HPV DNA reflex criteria were not met with this specimen result therefore, no HPV testing was performed.    Chlamydia, Nuc. Acid Amp Negative Negative   Gonococcus, Nuc. Acid Amp Negative Negative   Trich vag by NAA Negative Negative  RPR+Rh+ABO+Rub Ab+Ab Scr+CB...     Status: Abnormal   Collection Time: 04/14/17  2:26 PM  Result Value Ref Range   HIV Screen 4th Generation wRfx Non Reactive Non Reactive   Varicella zoster IgG 728 Immune >165 index    Comment:                                Negative          <135                                Equivocal    135 - 165                                Positive          >165 A positive result generally indicates exposure to the pathogen or administration of specific immunoglobulins, but it is not indication of active infection or stage of disease.    Hepatitis B Surface Ag Negative Negative   RPR Ser Ql Non  Reactive Non Reactive   Rubella Antibodies, IGG 1.08 Immune >0.99 index    Comment:                                 Non-immune       <  0.90                                 Equivocal  0.90 - 0.99                                 Immune           >0.99    ABO Grouping B    Rh Factor Positive     Comment: Please note: Prior records for this patient's ABO / Rh type are not available for additional verification.    Antibody Screen Negative Negative   WBC 13.6 (H) 3.4 - 10.8 x10E3/uL   RBC 4.40 3.77 - 5.28 x10E6/uL   Hemoglobin 12.4 11.1 - 15.9 g/dL   Hematocrit 37.3 34.0 - 46.6 %   MCV 85 79 - 97 fL   MCH 28.2 26.6 - 33.0 pg   MCHC 33.2 31.5 - 35.7 g/dL   RDW 13.0 12.3 - 15.4 %   Platelets 292 150 - 379 x10E3/uL  First Trimester Screen w/NT     Status: None   Collection Time: 05/20/17  2:20 PM  Result Value Ref Range   Results Report    Test Results: *Screen Negative*    CRL 71.7 mm   CRL Scan Date:     Comment:                               05/20/2017   Sonographer ID# [id number]    Gest Age-Collect 13.0 weeks   [age]D [age] yr   Race Caucasian    Weight 120 lbs   Number of Fetuses 1    Nuchal Translucency 2.4 mm   Nuchal Translucency MoM 1.36    hCG Value 72.9 IU/mL   hCG MoM 0.77    PAPP-A Value 953.5 ng/mL   PAPP-A MoM 0.65    DIA Value 319.3 pg/mL   DIA MoM 1.21    Down Syndrome Screening Risk:     Comment:                               1 in 4400   Down Syndrome Age Risk:     Comment:                               1 in 1018   Trisomy 18 Screening Risk:     Comment:                              <1 in 10000   Trisomy 18 Age Risk:     Comment:                               1 in 69   Down Syndrome Interpretation Comment     Comment: Screen Negative for Down syndrome   Trisomy 28 (Edward) Syndrome Interp. Comment     Comment: Screen Negative for Trisomy 18   Comments Comment     Comment: The [employer]nec[employer]  recommends that all women be counseled regarding the differences between  screening and invasive diagnostic testing.    Note: Comment     Comment: This test does not screen for open neural tube defects (ONTD). Measuring MS-AFP (test number [id number]) in the second trimester can provide ONTD screening.  The optimal gestational age for ONTD screening is 55 - 18 weeks. Driscilla Moats, Ph.D., [place] Technical Director References: Available upon request Risk Cutoffs  Down Syndrome (DS) cutoff 1:250  Trisomy 18 (T18) cutoff   1:100 For further inquiries contact [employer] at [phone]-GENE.      Assessment & Plan  1. Respiratory infection - Supportive measures - netti pot or saline nasal spray, push fluids, cool mist humidifier.  2. Nasal congestion - loratadine (CLARITIN) 10 MG tablet; Take 1 tablet (10 mg total) by mouth daily.  Dispense: 30 tablet; Refill: 1  3. Cough - Supportive measures - throat lozenges, cool mist humidifier. - Push fluids  -Red flags and when to present for emergency care or RTC including fever >101.53F, chest pain, shortness of breath, new/worsening/un-resolving symptoms, reviewed with patient at time of visit. Follow up and care instructions discussed and provided in AVS.

## 2017-06-24 NOTE — Telephone Encounter (Signed)
I recommended saline nasal spray during visit. I am able to place order for ocean spray if needed. Please call to clarify with patient. Thank you!

## 2017-07-08 ENCOUNTER — Ambulatory Visit (INDEPENDENT_AMBULATORY_CARE_PROVIDER_SITE_OTHER): Payer: Medicaid Other

## 2017-07-08 ENCOUNTER — Ambulatory Visit (INDEPENDENT_AMBULATORY_CARE_PROVIDER_SITE_OTHER): Payer: Medicaid Other | Admitting: Maternal Newborn

## 2017-07-08 ENCOUNTER — Encounter: Payer: Self-pay | Admitting: Maternal Newborn

## 2017-07-08 VITALS — BP 120/60 | Wt 124.0 lb

## 2017-07-08 DIAGNOSIS — Z3400 Encounter for supervision of normal first pregnancy, unspecified trimester: Secondary | ICD-10-CM

## 2017-07-08 DIAGNOSIS — Z3A20 20 weeks gestation of pregnancy: Secondary | ICD-10-CM

## 2017-07-08 DIAGNOSIS — Z362 Encounter for other antenatal screening follow-up: Secondary | ICD-10-CM | POA: Diagnosis not present

## 2017-07-08 NOTE — Progress Notes (Signed)
Routine Prenatal Care Visit  Subjective  Tiffany Guzman is a 21 y.o. G1P0 at [redacted]w[redacted]d being seen today for ongoing prenatal care.  She is currently monitored for the following issues for this low-risk pregnancy and has Breast tenderness; Urinary incontinence; Anxiety; Supervision of normal first pregnancy, antepartum; Nausea and vomiting during pregnancy; and Left ovarian cyst on her problem list.  ----------------------------------------------------------------------------------- Patient reports some new pains in the area of her right ovary. She did slip and fall during the recent snow but it was a gentle landing and baby is moving well, no signs of problems on today's ultrasound. Contractions: Not present. Vag. Bleeding: None.  Movement: Present. Denies leaking of fluid.  ----------------------------------------------------------------------------------- The following portions of the patient's history were reviewed and updated as appropriate: allergies, current medications, past family history, past medical history, past social history, past surgical history and problem list. Problem list updated.   Objective  Blood pressure 120/60, weight 124 lb (56.2 kg), last menstrual period 02/14/2017. Pregravid weight 120 lb (54.4 kg) Total Weight Gain 4 lb (1.814 kg) Urinalysis: unable to void as she previously emptied bladder before ultrasound Fetal Status: Fetal Heart Rate (bpm): 152 Fundal Height: 20 cm Movement: Present     General:  Alert, oriented and cooperative. Patient is in no acute distress.  Skin: Skin is warm and dry. No rash noted.   Cardiovascular: Normal heart rate noted  Respiratory: Normal respiratory effort, no problems with respiration noted  Abdomen: Soft, gravid, appropriate for gestational age. Pain/Pressure: Absent     Pelvic:  Cervical exam deferred        Extremities: Normal range of motion.  Edema: None  Mental Status: Normal mood and affect. Normal behavior. Normal  judgment and thought content.     Assessment   21 y.o. G1P0 at [redacted]w[redacted]d, EDD 11/21/2017 by Last Menstrual Period presenting for routine prenatal visit.  Plan   pregnancy Problems (from 04/14/17 to present)    Problem Noted Resolved   Left ovarian cyst 05/20/2017 by Will Bonnet, MD No   Nausea and vomiting during pregnancy 05/12/2017 by Will Bonnet, MD No   Supervision of normal first pregnancy, antepartum 04/14/2017 by Rod Can, CNM No   Overview Addendum 06/17/2017 10:25 AM by Homero Fellers, MD    [facility]inic [facility] Prenatal Labs  Dating L=8 Blood type: B/Positive/-- (09/24 1426)   Genetic Screen 1 Screen: [X]  10/30 negative Antibody:Negative (09/24 1426)  Anatomic [place]  Rubella: 1.08 (09/24 1426) Varicella:    GTT Early:               Third trimester:  RPR: Non Reactive (09/24 1426)   Rhogam  HBsAg: Negative (09/24 1426)   TDaP vaccine                        Flu Shot: DECLINES HIV:   Negative  Baby Food                                GBS:   Contraception  Pap:  CBB     CS/VBAC    Support Person           Anatomy ultrasound complete today. It's a boy! Stable left ovarian cyst and new right ovarian cyst.    Preterm labor symptoms and general obstetric precautions including but not limited to vaginal bleeding, contractions, leaking of fluid and fetal movement were reviewed  in detail with the patient.  Return in about 4 weeks (around 08/05/2017) for ROB.  Avel Sensor, CNM 07/08/2017  10:42 AM

## 2017-07-08 NOTE — Progress Notes (Signed)
C/o pains on right side - u/s showed a cyst on that side.rj

## 2017-07-24 ENCOUNTER — Encounter: Payer: Self-pay | Admitting: Obstetrics and Gynecology

## 2017-07-30 ENCOUNTER — Encounter: Payer: Self-pay | Admitting: Obstetrics and Gynecology

## 2017-08-01 ENCOUNTER — Other Ambulatory Visit: Payer: Self-pay | Admitting: Obstetrics and Gynecology

## 2017-08-01 DIAGNOSIS — Z3400 Encounter for supervision of normal first pregnancy, unspecified trimester: Secondary | ICD-10-CM

## 2017-08-01 MED ORDER — PRENATAL 27-0.8 MG PO TABS: 1.0000 | ORAL_TABLET | Freq: Every day | ORAL | 11 refills | Status: DC

## 2017-08-03 ENCOUNTER — Encounter: Payer: Self-pay | Admitting: Obstetrics and Gynecology

## 2017-08-05 ENCOUNTER — Encounter: Payer: Self-pay | Admitting: Obstetrics and Gynecology

## 2017-08-05 ENCOUNTER — Ambulatory Visit (INDEPENDENT_AMBULATORY_CARE_PROVIDER_SITE_OTHER): Payer: Medicaid Other | Admitting: Obstetrics and Gynecology

## 2017-08-05 VITALS — BP 118/74 | Wt 136.0 lb

## 2017-08-05 DIAGNOSIS — Z3A24 24 weeks gestation of pregnancy: Secondary | ICD-10-CM

## 2017-08-05 DIAGNOSIS — Z131 Encounter for screening for diabetes mellitus: Secondary | ICD-10-CM

## 2017-08-05 DIAGNOSIS — F419 Anxiety disorder, unspecified: Secondary | ICD-10-CM

## 2017-08-05 DIAGNOSIS — Z113 Encounter for screening for infections with a predominantly sexual mode of transmission: Secondary | ICD-10-CM

## 2017-08-05 DIAGNOSIS — Z3400 Encounter for supervision of normal first pregnancy, unspecified trimester: Secondary | ICD-10-CM

## 2017-08-05 DIAGNOSIS — O219 Vomiting of pregnancy, unspecified: Secondary | ICD-10-CM

## 2017-08-05 MED ORDER — PRENATAL VITAMIN PLUS LOW IRON 27-1 MG PO TABS: 1.0000 | ORAL_TABLET | Freq: Every day | ORAL | 5 refills | Status: DC

## 2017-08-05 NOTE — Progress Notes (Signed)
  Routine Prenatal Care Visit  Subjective  Tiffany Guzman is a 22 y.o. G1P0 at [redacted]w[redacted]d being seen today for ongoing prenatal care.  She is currently monitored for the following issues for this low-risk pregnancy and has Breast tenderness; Urinary incontinence; Anxiety; Supervision of normal first pregnancy, antepartum; Nausea and vomiting during pregnancy; and Left ovarian cyst on their problem list.  ----------------------------------------------------------------------------------- Patient reports no complaints.   Contractions: Not present. Vag. Bleeding: None.  Movement: Present. Denies leaking of fluid.  ----------------------------------------------------------------------------------- The following portions of the patient's history were reviewed and updated as appropriate: allergies, current medications, past family history, past medical history, past social history, past surgical history and problem list. Problem list updated.   Objective  Blood pressure 118/74, weight 136 lb (61.7 kg), last menstrual period 02/14/2017. Pregravid weight 120 lb (54.4 kg) Total Weight Gain 16 lb (7.258 kg) Urinalysis: Urine Protein: Negative Urine Glucose: Negative  Fetal Status: Fetal Heart Rate (bpm): 155 Fundal Height: 24 cm Movement: Present     General:  Alert, oriented and cooperative. Patient is in no acute distress.  Skin: Skin is warm and dry. No rash noted.   Cardiovascular: Normal heart rate noted  Respiratory: Normal respiratory effort, no problems with respiration noted  Abdomen: Soft, gravid, appropriate for gestational age. Pain/Pressure: Absent     Pelvic:  Cervical exam deferred        Extremities: Normal range of motion.     Mental Status: Normal mood and affect. Normal behavior. Normal judgment and thought content.   Assessment   22 y.o. G1P0 at [redacted]w[redacted]d by  11/21/2017, by Last Menstrual Period presenting for routine prenatal visit  Plan   pregnancy Problems (from 04/14/17 to  present)    Problem Noted Resolved   Left ovarian cyst 05/20/2017 by Will Bonnet, MD No   Nausea and vomiting during pregnancy 05/12/2017 by Will Bonnet, MD No   Supervision of normal first pregnancy, antepartum 04/14/2017 by Rod Can, CNM No   Overview Addendum 06/17/2017 10:25 AM by Homero Fellers, MD    [facility]inic [facility] Prenatal Labs  Dating L=8 Blood type: B/Positive/-- (09/24 1426)   Genetic Screen 1 Screen: [X]  10/30 negative Antibody:Negative (09/24 1426)  Anatomic [place]  Rubella: 1.08 (09/24 1426) Varicella:    GTT Early:               Third trimester:  RPR: Non Reactive (09/24 1426)   Rhogam  HBsAg: Negative (09/24 1426)   TDaP vaccine                        Flu Shot: DECLINES HIV:   Negative  Baby Food                                GBS:   Contraception  Pap:  CBB     CS/VBAC    Support Person              Preterm labor symptoms and general obstetric precautions including but not limited to vaginal bleeding, contractions, leaking of fluid and fetal movement were reviewed in detail with the patient. Please refer to After Visit Summary for other counseling recommendations.   Return in about 3 weeks (around 08/26/2017) for schedule 1h gtt and routine prenatal.  Prentice Docker, MD  08/05/2017 3:26 PM

## 2017-08-26 ENCOUNTER — Other Ambulatory Visit: Payer: Medicaid Other

## 2017-08-26 ENCOUNTER — Encounter: Payer: Self-pay | Admitting: Obstetrics and Gynecology

## 2017-08-26 ENCOUNTER — Ambulatory Visit (INDEPENDENT_AMBULATORY_CARE_PROVIDER_SITE_OTHER): Payer: Medicaid Other | Admitting: Obstetrics and Gynecology

## 2017-08-26 VITALS — BP 108/52 | Wt 140.0 lb

## 2017-08-26 DIAGNOSIS — Z131 Encounter for screening for diabetes mellitus: Secondary | ICD-10-CM

## 2017-08-26 DIAGNOSIS — Z3400 Encounter for supervision of normal first pregnancy, unspecified trimester: Secondary | ICD-10-CM

## 2017-08-26 DIAGNOSIS — Z113 Encounter for screening for infections with a predominantly sexual mode of transmission: Secondary | ICD-10-CM

## 2017-08-26 DIAGNOSIS — Z3A27 27 weeks gestation of pregnancy: Secondary | ICD-10-CM

## 2017-08-26 NOTE — Progress Notes (Signed)
    Routine Prenatal Care Visit  Subjective  Tiffany Guzman is a 22 y.o. G1P0 at [redacted]w[redacted]d being seen today for ongoing prenatal care.  She is currently monitored for the following issues for this low-risk pregnancy and has Breast tenderness; Urinary incontinence; Anxiety; Supervision of normal first pregnancy, antepartum; and Left ovarian cyst on their problem list.  ----------------------------------------------------------------------------------- Patient reports has noted some rare contractions, infrequent, no patterns.   Contractions: Irritability. Vag. Bleeding: None.  Movement: Present. Denies leaking of fluid.  ----------------------------------------------------------------------------------- The following portions of the patient's history were reviewed and updated as appropriate: allergies, current medications, past family history, past medical history, past social history, past surgical history and problem list. Problem list updated.   Objective  Blood pressure (!) 108/52, weight 140 lb (63.5 kg), last menstrual period 02/14/2017, unknown if currently breastfeeding. Pregravid weight 120 lb (54.4 kg) Total Weight Gain 20 lb (9.072 kg) Urinalysis: Urine Protein: Negative Urine Glucose: Negative  Fetal Status: Fetal Heart Rate (bpm): 145 Fundal Height: 27 cm Movement: Present     General:  Alert, oriented and cooperative. Patient is in no acute distress.  Skin: Skin is warm and dry. No rash noted.   Cardiovascular: Normal heart rate noted  Respiratory: Normal respiratory effort, no problems with respiration noted  Abdomen: Soft, gravid, appropriate for gestational age. Pain/Pressure: Absent     Pelvic:  Cervical exam deferred        Extremities: Normal range of motion.     ental Status: Normal mood and affect. Normal behavior. Normal judgment and thought content.     Assessment   22 y.o. G1P0 at [redacted]w[redacted]d by  11/21/2017, by Last Menstrual Period presenting for routine prenatal  visit  Plan   pregnancy Problems (from 04/14/17 to 08/26/17)    Problem Noted Resolved   Left ovarian cyst 05/20/2017 by Will Bonnet, MD No   Supervision of normal first pregnancy, antepartum 04/14/2017 by Rod Can, CNM No   Overview Addendum 08/26/2017  9:41 AM by Malachy Mood, MD    [facility] Prenatal Labs  Dating L=8 Blood type: B/Positive/-- (09/24 1426)   Genetic Screen 1 Screen: [X]  10/30 negative Antibody:Negative (09/24 1426)  Anatomic [place]  Rubella: 1.08 (09/24 1426) Varicella:    GTT  RPR: Non Reactive (09/24 1426)   Rhogam N/A HBsAg: Negative (09/24 1426)   TDaP vaccine                        Flu Shot: DECLINES HIV:   Negative  Baby Food                                GBS:   Contraception  Pap:  CBB     CS/VBAC    Support Person          Nausea and vomiting during pregnancy 05/12/2017 by Will Bonnet, MD 08/26/2017 by Malachy Mood, MD       Preterm labor symptoms and general obstetric precautions including but not limited to vaginal bleeding, contractions, leaking of fluid and fetal movement were reviewed in detail with the patient. Please refer to After Visit Summary for other counseling recommendations.  - 28 week labs today  Return in about 2 weeks (around 09/09/2017) for ROB.

## 2017-08-26 NOTE — Progress Notes (Signed)
ROB Braxton hicks  GTT today

## 2017-08-27 LAB — 28 WEEK RH+PANEL
BASOS ABS: 0 10*3/uL (ref 0.0–0.2)
BASOS: 0 %
EOS (ABSOLUTE): 0.1 10*3/uL (ref 0.0–0.4)
EOS: 1 %
Gestational Diabetes Screen: 85 mg/dL (ref 65–139)
HIV SCREEN 4TH GENERATION: NONREACTIVE
Hematocrit: 35.1 % (ref 34.0–46.6)
Hemoglobin: 11.4 g/dL (ref 11.1–15.9)
IMMATURE GRANULOCYTES: 1 %
Immature Grans (Abs): 0.1 10*3/uL (ref 0.0–0.1)
LYMPHS: 15 %
Lymphocytes Absolute: 1.6 10*3/uL (ref 0.7–3.1)
MCH: 28.5 pg (ref 26.6–33.0)
MCHC: 32.5 g/dL (ref 31.5–35.7)
MCV: 88 fL (ref 79–97)
Monocytes Absolute: 0.8 10*3/uL (ref 0.1–0.9)
Monocytes: 7 %
NEUTROS PCT: 76 %
Neutrophils Absolute: 8.2 10*3/uL — ABNORMAL HIGH (ref 1.4–7.0)
PLATELETS: 237 10*3/uL (ref 150–379)
RBC: 4 x10E6/uL (ref 3.77–5.28)
RDW: 13.4 % (ref 12.3–15.4)
RPR Ser Ql: NONREACTIVE
WBC: 10.8 10*3/uL (ref 3.4–10.8)

## 2017-09-04 ENCOUNTER — Observation Stay
Admission: EM | Admit: 2017-09-04 | Discharge: 2017-09-05 | Disposition: A | Discharge status: Home / Self Care | Payer: Medicaid Other | Attending: Obstetrics and Gynecology | Admitting: Obstetrics and Gynecology

## 2017-09-04 ENCOUNTER — Encounter: Payer: Self-pay | Admitting: Obstetrics and Gynecology

## 2017-09-04 ENCOUNTER — Observation Stay: Payer: Medicaid Other

## 2017-09-04 DIAGNOSIS — O3480 Maternal care for other abnormalities of pelvic organs, unspecified trimester: Secondary | ICD-10-CM

## 2017-09-04 DIAGNOSIS — O26893 Other specified pregnancy related conditions, third trimester: Secondary | ICD-10-CM | POA: Diagnosis not present

## 2017-09-04 DIAGNOSIS — F909 Attention-deficit hyperactivity disorder, unspecified type: Secondary | ICD-10-CM | POA: Diagnosis not present

## 2017-09-04 DIAGNOSIS — Z3A29 29 weeks gestation of pregnancy: Secondary | ICD-10-CM | POA: Diagnosis not present

## 2017-09-04 DIAGNOSIS — O99613 Diseases of the digestive system complicating pregnancy, third trimester: Secondary | ICD-10-CM | POA: Diagnosis not present

## 2017-09-04 DIAGNOSIS — D279 Benign neoplasm of unspecified ovary: Secondary | ICD-10-CM

## 2017-09-04 DIAGNOSIS — Z3400 Encounter for supervision of normal first pregnancy, unspecified trimester: Secondary | ICD-10-CM

## 2017-09-04 DIAGNOSIS — K219 Gastro-esophageal reflux disease without esophagitis: Secondary | ICD-10-CM | POA: Insufficient documentation

## 2017-09-04 DIAGNOSIS — N83202 Unspecified ovarian cyst, left side: Secondary | ICD-10-CM

## 2017-09-04 DIAGNOSIS — R109 Unspecified abdominal pain: Secondary | ICD-10-CM

## 2017-09-04 DIAGNOSIS — O99343 Other mental disorders complicating pregnancy, third trimester: Secondary | ICD-10-CM | POA: Insufficient documentation

## 2017-09-04 DIAGNOSIS — R112 Nausea with vomiting, unspecified: Secondary | ICD-10-CM | POA: Diagnosis not present

## 2017-09-04 DIAGNOSIS — Z79899 Other long term (current) drug therapy: Secondary | ICD-10-CM | POA: Diagnosis not present

## 2017-09-04 DIAGNOSIS — Z3A28 28 weeks gestation of pregnancy: Secondary | ICD-10-CM | POA: Diagnosis not present

## 2017-09-04 DIAGNOSIS — D271 Benign neoplasm of left ovary: Secondary | ICD-10-CM | POA: Insufficient documentation

## 2017-09-04 LAB — URINALYSIS, COMPLETE (UACMP) WITH MICROSCOPIC
BILIRUBIN URINE: NEGATIVE
GLUCOSE, UA: NEGATIVE mg/dL
Hgb urine dipstick: NEGATIVE
KETONES UR: 80 mg/dL — AB
Leukocytes, UA: NEGATIVE
Nitrite: NEGATIVE
PROTEIN: 100 mg/dL — AB
Specific Gravity, Urine: 1.024 (ref 1.005–1.030)
pH: 6 (ref 5.0–8.0)

## 2017-09-04 LAB — CBC
HEMATOCRIT: 34.2 % — AB (ref 35.0–47.0)
Hemoglobin: 11.7 g/dL — ABNORMAL LOW (ref 12.0–16.0)
MCH: 29.2 pg (ref 26.0–34.0)
MCHC: 34.3 g/dL (ref 32.0–36.0)
MCV: 85.1 fL (ref 80.0–100.0)
Platelets: 247 10*3/uL (ref 150–440)
RBC: 4.02 MIL/uL (ref 3.80–5.20)
RDW: 13.9 % (ref 11.5–14.5)
WBC: 18.7 10*3/uL — AB (ref 3.6–11.0)

## 2017-09-04 LAB — COMPREHENSIVE METABOLIC PANEL
ALT: 15 U/L (ref 14–54)
ANION GAP: 15 (ref 5–15)
AST: 33 U/L (ref 15–41)
Albumin: 3.4 g/dL — ABNORMAL LOW (ref 3.5–5.0)
Alkaline Phosphatase: 87 U/L (ref 38–126)
BUN: 12 mg/dL (ref 6–20)
CHLORIDE: 106 mmol/L (ref 101–111)
CO2: 17 mmol/L — ABNORMAL LOW (ref 22–32)
Calcium: 8.8 mg/dL — ABNORMAL LOW (ref 8.9–10.3)
Creatinine, Ser: 0.62 mg/dL (ref 0.44–1.00)
GFR calc Af Amer: >60 mL/min (ref >60)
Glucose, Bld: 116 mg/dL — ABNORMAL HIGH (ref 65–99)
POTASSIUM: 3.2 mmol/L — AB (ref 3.5–5.1)
Sodium: 138 mmol/L (ref 135–145)
Total Bilirubin: 0.8 mg/dL (ref 0.3–1.2)
Total Protein: 7.2 g/dL (ref 6.5–8.1)

## 2017-09-04 LAB — LIPASE, BLOOD: LIPASE: 28 U/L (ref 11–51)

## 2017-09-04 MED ORDER — LORAZEPAM 2 MG/ML IJ SOLN
1.0000 mg | Freq: Once | INTRAMUSCULAR | Status: AC
  Administered 2017-09-04: 1 mg via INTRAVENOUS

## 2017-09-04 MED ORDER — LACTATED RINGERS IV SOLN
INTRAVENOUS | Status: DC
  Administered 2017-09-05: 05:00:00 via INTRAVENOUS

## 2017-09-04 MED ORDER — SODIUM CHLORIDE FLUSH 0.9 % IV SOLN
INTRAVENOUS | Status: AC
  Filled 2017-09-04: qty 10

## 2017-09-04 MED ORDER — MORPHINE SULFATE (PF) 2 MG/ML IV SOLN
2.0000 mg | INTRAVENOUS | Status: DC | PRN
  Administered 2017-09-04 – 2017-09-05 (×3): 2 mg via INTRAVENOUS
  Filled 2017-09-04 (×3): qty 1

## 2017-09-04 MED ORDER — PROMETHAZINE HCL 25 MG/ML IJ SOLN
12.5000 mg | Freq: Four times a day (QID) | INTRAMUSCULAR | Status: DC | PRN
  Administered 2017-09-04 – 2017-09-05 (×2): 12.5 mg via INTRAVENOUS
  Filled 2017-09-04 (×2): qty 1

## 2017-09-04 MED ORDER — ONDANSETRON HCL 4 MG/2ML IJ SOLN
4.0000 mg | Freq: Four times a day (QID) | INTRAMUSCULAR | Status: DC | PRN
  Administered 2017-09-04: 4 mg via INTRAVENOUS
  Filled 2017-09-04: qty 2

## 2017-09-04 MED ORDER — CALCIUM CARBONATE ANTACID 500 MG PO CHEW: 2.0000 | CHEWABLE_TABLET | ORAL | Status: DC | PRN

## 2017-09-04 MED ORDER — LACTATED RINGERS IV SOLN
INTRAVENOUS | Status: DC
  Administered 2017-09-04: 20:00:00 via INTRAVENOUS

## 2017-09-04 MED ORDER — LORAZEPAM 2 MG/ML IJ SOLN
INTRAMUSCULAR | Status: AC
  Administered 2017-09-04: 1 mg via INTRAVENOUS
  Filled 2017-09-04: qty 1

## 2017-09-04 MED ORDER — LACTATED RINGERS IV BOLUS (SEPSIS)
1000.0000 mL | Freq: Once | INTRAVENOUS | Status: AC
  Administered 2017-09-04: 1000 mL via INTRAVENOUS

## 2017-09-04 NOTE — OB Triage Note (Signed)
G1P0 pt due 11/21/17 presents to [facility] d/t abdominal pain and lower and mid back pain 10/10 that began this morning at 0530am.  Stomach tender to touch. Afebrile (98.0) VSS, monitors applied and assessing. Denies bleeding and LOF, reports positive fetal movement.

## 2017-09-04 NOTE — Progress Notes (Signed)
Obstetric and Gynecology  Subjective  Feeling better than on admission.  Still some abdominal pain, but nausea improved.  Objective   Vitals:   09/04/17 1715 09/04/17 1953  BP: 137/77 128/65  Pulse: (!) 117 (!) 125  Resp: 18 20  Temp: 98 F (36.7 C) 98.7 F (37.1 C)    No intake or output data in the 24 hours ending 09/04/17 2233  General: NAD, appear much more comfortable Pulmonary: no increased work of breathing Abdomen: Gravid, soft, still some residual epigastric tenderness to palpation Extremities: no edema  Labs: Results for orders placed or performed during the hospital encounter of 09/04/17 (from the past 24 hour(s))  Urinalysis, Complete w Microscopic     Status: Abnormal   Collection Time: 09/04/17  5:23 PM  Result Value Ref Range   Color, Urine AMBER (A) YELLOW   APPearance CLOUDY (A) CLEAR   Specific Gravity, Urine 1.024 1.005 - 1.030   pH 6.0 5.0 - 8.0   Glucose, UA NEGATIVE NEGATIVE mg/dL   Hgb urine dipstick NEGATIVE NEGATIVE   Bilirubin Urine NEGATIVE NEGATIVE   Ketones, ur 80 (A) NEGATIVE mg/dL   Protein, ur 100 (A) NEGATIVE mg/dL   Nitrite NEGATIVE NEGATIVE   Leukocytes, UA NEGATIVE NEGATIVE   RBC / HPF 0-5 0 - 5 RBC/hpf   WBC, UA 6-30 0 - 5 WBC/hpf   Bacteria, UA MANY (A) NONE SEEN   Squamous Epithelial / LPF 6-30 (A) NONE SEEN   Mucus PRESENT   Comprehensive metabolic panel     Status: Abnormal   Collection Time: 09/04/17  5:53 PM  Result Value Ref Range   Sodium 138 135 - 145 mmol/L   Potassium 3.2 (L) 3.5 - 5.1 mmol/L   Chloride 106 101 - 111 mmol/L   CO2 17 (L) 22 - 32 mmol/L   Glucose, Bld 116 (H) 65 - 99 mg/dL   BUN 12 6 - 20 mg/dL   Creatinine, Ser 0.62 0.44 - 1.00 mg/dL   Calcium 8.8 (L) 8.9 - 10.3 mg/dL   Total Protein 7.2 6.5 - 8.1 g/dL   Albumin 3.4 (L) 3.5 - 5.0 g/dL   AST 33 15 - 41 U/L   ALT 15 14 - 54 U/L   Alkaline Phosphatase 87 38 - 126 U/L   Total Bilirubin 0.8 0.3 - 1.2 mg/dL   GFR calc non Af Amer >60 >60 mL/min   GFR calc Af Amer >60 >60 mL/min   Anion gap 15 5 - 15  Lipase, blood     Status: None   Collection Time: 09/04/17  5:53 PM  Result Value Ref Range   Lipase 28 11 - 51 U/L  CBC on admission     Status: Abnormal   Collection Time: 09/04/17  5:53 PM  Result Value Ref Range   WBC 18.7 (H) 3.6 - 11.0 K/uL   RBC 4.02 3.80 - 5.20 MIL/uL   Hemoglobin 11.7 (L) 12.0 - 16.0 g/dL   HCT 34.2 (L) 35.0 - 47.0 %   MCV 85.1 80.0 - 100.0 fL   MCH 29.2 26.0 - 34.0 pg   MCHC 34.3 32.0 - 36.0 g/dL   RDW 13.9 11.5 - 14.5 %   Platelets 247 150 - 440 K/uL    Cultures: Results for orders placed or performed in visit on 04/14/17  Urine Culture     Status: None   Collection Time: 04/14/17  2:16 PM  Result Value Ref Range Status   Urine Culture, Routine Final report  Final   Organism ID, Bacteria No growth  Final   Imaging: Mr Pelvis Wo Contrast  Result Date: 09/04/2017 CLINICAL DATA:  Twenty-eight weeks and 6 days gestation with abdominal pain common nausea and vomiting. EXAM: MRI ABDOMEN AND PELVIS WITHOUT CONTRAST TECHNIQUE: Multiplanar multisequence MR imaging of the abdomen and pelvis was performed. No intravenous contrast was administered. COMPARISON:  None. FINDINGS: COMBINED FINDINGS FOR BOTH MR ABDOMEN AND PELVIS Lower chest: The lung bases are grossly clear. Hepatobiliary: The liver is normal. No lesions or intrahepatic biliary dilatation. The gallbladder is normal. No gallstones or findings for acute cholecystitis. No common bile duct dilatation. Pancreas:  No mass, inflammation or ductal dilatation. Spleen:  Normal size.  No focal lesions. Adrenals/Urinary Tract: The adrenal glands and kidneys are unremarkable. No findings to suggest pyelonephritis or obstructing ureteral calculi. Stomach/Bowel: The stomach, duodenum, small bowel and colon are grossly normal. No obstructive findings or inflammatory changes. No findings to suggest acute appendicitis. The right ovary appears normal. Vascular/Lymphatic:  No pathologically enlarged lymph nodes identified. No abdominal aortic aneurysm demonstrated. Reproductive: Gravid uterus with a 28 week fetus. Normal-appearing amniotic fluid volume and anterior placenta. 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 This measures a maximum of 8.9 x 8.1 cm. Musculoskeletal: No significant findings. IMPRESSION: 1. No MR findings to suggest acute appendicitis, cholecystitis or pyelonephritis/hydronephrosis. 2. Gravid uterus with normal-appearing amniotic fluid volume and placenta. 3. 8.9 x 8.1 cm pelvic dermoid adjacent to the left ovary. Electronically Signed   By: Marijo Sanes M.D.   On: 09/04/2017 21:50   Mr Abdomen Wo Contrast  Result Date: 09/04/2017 CLINICAL DATA:  Twenty-eight weeks and 6 days gestation with abdominal pain common nausea and vomiting. EXAM: MRI ABDOMEN AND PELVIS WITHOUT CONTRAST TECHNIQUE: Multiplanar multisequence MR imaging of the abdomen and pelvis was performed. No intravenous contrast was administered. COMPARISON:  None. FINDINGS: COMBINED FINDINGS FOR BOTH MR ABDOMEN AND PELVIS Lower chest: The lung bases are grossly clear. Hepatobiliary: The liver is normal. No lesions or intrahepatic biliary dilatation. The gallbladder is normal. No gallstones or findings for acute cholecystitis. No common bile duct dilatation. Pancreas:  No mass, inflammation or ductal dilatation. Spleen:  Normal size.  No focal lesions. Adrenals/Urinary Tract: The adrenal glands and kidneys are unremarkable. No findings to suggest pyelonephritis or obstructing ureteral calculi. Stomach/Bowel: The stomach, duodenum, small bowel and colon are grossly normal. No obstructive findings or inflammatory changes. No findings to suggest acute appendicitis. The right ovary appears normal. Vascular/Lymphatic: No pathologically enlarged lymph nodes identified. No abdominal aortic aneurysm demonstrated. Reproductive: Gravid uterus with a 28 week  fetus. Normal-appearing amniotic fluid volume and anterior placenta. 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 This measures a maximum of 8.9 x 8.1 cm. Musculoskeletal: No significant findings. IMPRESSION: 1. No MR findings to suggest acute appendicitis, cholecystitis or pyelonephritis/hydronephrosis. 2. Gravid uterus with normal-appearing amniotic fluid volume and placenta. 3. 8.9 x 8.1 cm pelvic dermoid adjacent to the left ovary. Electronically Signed   By: Marijo Sanes M.D.   On: 09/04/2017 21:50     Assessment   22 y.o. G1P0 at 28w6 days presenting with likely viral gastroenteritis  Plan   1) Viral gastroenteritis - labs normal except elevated WBC, she does have known sick contacts in her family with similar symptoms.  MRI negative for appendicitis, cholecystitis, pancreatitis, pylonephritis with stable size left dermoid cyst without surrounding edema or findings concerning for torsion. - admit  to antepartum service - repeat CBC - Continue phenergan, zofran, and prn morphine (has noted improvement in symptoms since admission)   Malachy Mood, MD, [place], [place] 09/04/2017, 10:37 PM

## 2017-09-04 NOTE — H&P (Signed)
Obstetric H&P   Chief Complaint: abdominal pain  Prenatal Care Provider: [facility]  History of Present Illness: 22 y.o. G1P0 at [redacted]w[redacted]d by LMP = 8 week [place] presenting with nausea, vomiting, abdominal pain.  Some loose stools.  Has felt ssubjectively febrile at home no recorded temperature and afebrile on admission.  She has not URI symptoms and has received influenza vaccination.  She reports epigastric abdominal pain.  Not worsened with food intake (has not eaten secondary to nausea and emesis today), unrelieved by bowl movement.  No dysuria.  +FM, no LOF, no VB, no ctx.    Her pregnancy has been uncomplicated other than note made of an 8cm left ovarian dermoid at the time of her anatomy scan.  She has been asymptomatic in regard to dermoid.    pregnancy Problems (from 04/14/17 to present)    Problem Noted Resolved   Left ovarian cyst 05/20/2017 by Will Bonnet, MD No   Supervision of normal first pregnancy, antepartum 04/14/2017 by Rod Can, CNM No   Overview Addendum 09/04/2017  5:14 PM by Malachy Mood, MD    [facility]inic [facility] Prenatal Labs  Dating L=8 Blood type: B/Positive/-- (09/24 1426)   Genetic Screen 1 Screen: [X]  10/30 negative Antibody:Negative (09/24 1426)  Anatomic [place] Normal female, bilateral dermoids left 8.80 x .8.6cm Rubella: 1.08 (09/24 1426) Varicella:    GTT 82 RPR: Non Reactive (09/24 1426)   Rhogam N/A HBsAg: Negative (09/24 1426)   TDaP vaccine                        Flu Shot: DECLINES HIV:   Negative  Baby Food                                GBS:   Contraception  Pap: NIL HPV positive  CBB     CS/VBAC    Support Person          Nausea and vomiting during pregnancy 05/12/2017 by Will Bonnet, MD 08/26/2017 by Malachy Mood, MD        Review of Systems: 10 point review of systems negative unless otherwise noted in HPI  Past Medical History: Past Medical History:  Diagnosis Date  . Attention deficit hyperactivity disorder (ADHD)   .  Chronic upper back pain   . GERD (gastroesophageal reflux disease)   . Migraine with aura    since elementary school    Past Surgical History: Past Surgical History:  Procedure Laterality Date  . INSERTION OF NON VAGINAL CONTRACEPTIVE DEVICE      Past Obstetric History:  Past Gynecologic History:  Family History: Family History  Problem Relation Age of Onset  . Hypertension Mother   . Hypothyroidism Mother   . Depression Mother   . Diabetes Maternal Grandmother     Social History: Social History   Socioeconomic History  . Marital status: Single    Spouse name: Not on file  . Number of children: Not on file  . Years of education: Not on file  . Highest education level: Not on file  Social Needs  . Financial resource strain: Not on file  . Food insecurity - worry: Not on file  . Food insecurity - inability: Not on file  . Transportation needs - medical: Not on file  . Transportation needs - non-medical: Not on file  Occupational History  . Not on file  Tobacco Use  .  Smoking status: Never Smoker  . Smokeless tobacco: Never Used  Substance and Sexual Activity  . Alcohol use: No    Alcohol/week: 0.0 oz  . Drug use: Yes    Types: Marijuana  . Sexual activity: Yes    Partners: Male    Birth control/protection: None  Other Topics Concern  . Not on file  Social History Narrative  . Not on file    Medications: Prior to Admission medications   Medication Sig Start Date End Date Taking? Authorizing Provider  Prenatal Vit-Fe Fumarate-FA (PRENATAL VITAMIN PLUS LOW IRON) 27-1 MG TABS Take 1 tablet by mouth daily. 08/05/17   Will Bonnet, MD    Allergies: No Known Allergies  Physical Exam: unknown if currently breastfeeding.  Urine Dip Protein: UA pending  FHT: 130, moderate, +accles, no decels (non-contiguous tracing patient sitting up vomitting) Toco: irritability  General: NAD HEENT: normocephalic, anicteric Pulmonary: No increased work of  breathing Cardiovascular: RRR, distal pulses 2+ Abdomen: Gravid, reproducible epigastric tenderness, some tenderness on right upper quadrant but negative Murphy's sign, no rebound, no guarding Genitourinary: deferref Extremities: no edema, erythema, or tenderness Neurologic: Grossly intact Psychiatric: mood appropriate, affect full  Labs: No results found for this or any previous visit (from the past 24 hour(s)).  Assessment: 22 y.o. G1P0 at [redacted]w[redacted]d by LMP = 8 week ultrasound presenting with abdominal pain, nausea, and vomitting  Plan: 1) Abdominal pain - given symptoms constellation most likely viral gastroenteritis. Ovarian torsion, cholecystitis, appendicitis, and pancreatitis in differential. - CBC, CMP, lipase, and UA - Zofran IV for nausea - IV fluids and fluid bolus - If elevations in white count and continued or worsening symptoms low threshold for MRI abdomen/pelvis  2) Fetus -  3) PNL - Blood type B/Positive/-- (09/24 1426) / Anti-bodyscreen Negative (09/24 1426) / Rubella 1.08 (09/24 1426) / Varicella Immune / RPR Non Reactive (02/05 0955) / HBsAg Negative (09/24 1426) / HIV Non Reactive (02/05 0955) / 1-hr OGTT 85 / GBS unknown  4) Immunization History -  Immunization History  Administered Date(s) Administered  . Influenza-Unspecified 05/14/2017    5) Disposition - pending symptoms resolution   Malachy Mood, MD, [facility] 09/04/2017, 5:25 PM

## 2017-09-05 DIAGNOSIS — O26893 Other specified pregnancy related conditions, third trimester: Secondary | ICD-10-CM | POA: Diagnosis not present

## 2017-09-05 DIAGNOSIS — R112 Nausea with vomiting, unspecified: Secondary | ICD-10-CM | POA: Diagnosis not present

## 2017-09-05 DIAGNOSIS — Z3A28 28 weeks gestation of pregnancy: Secondary | ICD-10-CM | POA: Diagnosis not present

## 2017-09-05 LAB — CBC
HEMATOCRIT: 28.9 % — AB (ref 35.0–47.0)
HEMOGLOBIN: 9.9 g/dL — AB (ref 12.0–16.0)
MCH: 29.1 pg (ref 26.0–34.0)
MCHC: 34.3 g/dL (ref 32.0–36.0)
MCV: 84.9 fL (ref 80.0–100.0)
Platelets: 216 10*3/uL (ref 150–440)
RBC: 3.4 MIL/uL — ABNORMAL LOW (ref 3.80–5.20)
RDW: 13.7 % (ref 11.5–14.5)
WBC: 13.6 10*3/uL — ABNORMAL HIGH (ref 3.6–11.0)

## 2017-09-05 MED ORDER — LACTATED RINGERS IV BOLUS (SEPSIS)
1000.0000 mL | Freq: Once | INTRAVENOUS | Status: AC
  Administered 2017-09-05: 1000 mL via INTRAVENOUS

## 2017-09-05 MED ORDER — SODIUM CHLORIDE FLUSH 0.9 % IV SOLN
INTRAVENOUS | Status: AC
  Filled 2017-09-05: qty 20

## 2017-09-05 MED ORDER — SODIUM CHLORIDE 0.9 % IV SOLN
8.0000 mg | Freq: Four times a day (QID) | INTRAVENOUS | Status: DC | PRN
  Filled 2017-09-05: qty 4

## 2017-09-05 MED ORDER — ONDANSETRON 4 MG PO TBDP
4.0000 mg | ORAL_TABLET | Freq: Four times a day (QID) | ORAL | Status: DC | PRN
  Administered 2017-09-05: 4 mg via ORAL
  Filled 2017-09-05: qty 1

## 2017-09-05 MED ORDER — ONDANSETRON HCL 4 MG/2ML IJ SOLN
INTRAMUSCULAR | Status: AC
  Administered 2017-09-05: 8 mg
  Filled 2017-09-05: qty 4

## 2017-09-05 MED ORDER — PROMETHAZINE HCL 25 MG PO TABS: 12.5000 mg | ORAL_TABLET | Freq: Four times a day (QID) | ORAL | Status: DC | PRN

## 2017-09-05 MED ORDER — PROMETHAZINE HCL 25 MG PO TABS: 12.5000 mg | ORAL_TABLET | Freq: Four times a day (QID) | ORAL | 2 refills | Status: DC | PRN

## 2017-09-05 MED ORDER — PROMETHAZINE HCL 25 MG/ML IJ SOLN
12.5000 mg | Freq: Four times a day (QID) | INTRAMUSCULAR | Status: DC | PRN
  Administered 2017-09-05: 12.5 mg via INTRAVENOUS
  Filled 2017-09-05: qty 1

## 2017-09-05 MED ORDER — ONDANSETRON HCL 4 MG/2ML IJ SOLN: 4.0000 mg | Freq: Four times a day (QID) | INTRAMUSCULAR | Status: DC | PRN

## 2017-09-05 MED ORDER — ONDANSETRON 4 MG PO TBDP: 4.0000 mg | ORAL_TABLET | Freq: Three times a day (TID) | ORAL | 0 refills | Status: DC | PRN

## 2017-09-05 MED ORDER — SODIUM CHLORIDE FLUSH 0.9 % IV SOLN
INTRAVENOUS | Status: AC
  Administered 2017-09-05: 10 mL via INTRAVENOUS
  Filled 2017-09-05: qty 10

## 2017-09-05 NOTE — Discharge Summary (Signed)
Physician Final Progress Note  Patient ID: Tiffany Guzman MRN: [id number] DOB/AGE: 09/01/1995 22 y.o.  Admit date: 09/04/2017 Admitting provider: Malachy Mood, MD Discharge date: 09/05/2017   Admission Diagnoses: nausea and vomiting/abdominal pain affecting pregnancy  Discharge Diagnoses:  Active Problems:   Dermoid cyst of ovary affecting pregnancy, antepartum   Abdominal pain during pregnancy, third trimester IUP at [redacted]w[redacted]d with positive fetal heart tones, continued nausea/vomiting. Patient has not had any improvement following, phenergan, zofran, IV fluid bolus, morphine and is requesting discharge to home.  History of Present Illness: The patient is a 22 y.o. female G1P0 at 104w0d who presents for nausea, vomiting and abdominal tenderness. She had loose stool yesterday and has not had a bowel movement today. She had been improving overnight and slept well. We attempted diet advance this morning with an increase in nausea/vomiting. IV antiemetic and fluid bolus given with no relief. Patient admits to the only things that help when she has an "episode" like this are smoking marijuana, or morphine (which she has received inpatient/ED). Discussion of recommendation against use of marijuana in pregnancy due to possible adverse effects. Patient agrees to a dose of morphine. She did not have relief from that either and is saying she wants to go home. She is counseled to hydrate with PO liquids with verbalized understanding.   Past Medical History:  Diagnosis Date  . Attention deficit hyperactivity disorder (ADHD)   . Chronic upper back pain   . GERD (gastroesophageal reflux disease)   . Migraine with aura    since elementary school    Past Surgical History:  Procedure Laterality Date  . INSERTION OF NON VAGINAL CONTRACEPTIVE DEVICE      No current facility-administered medications on file prior to encounter.    Current Outpatient Medications on File Prior to Encounter   Medication Sig Dispense Refill  . Prenatal Vit-Fe Fumarate-FA (PRENATAL VITAMIN PLUS LOW IRON) 27-1 MG TABS Take 1 tablet by mouth daily. 30 tablet 5    No Known Allergies  Social History   Socioeconomic History  . Marital status: Single    Spouse name: Not on file  . Number of children: Not on file  . Years of education: Not on file  . Highest education level: Not on file  Social Needs  . Financial resource strain: Not on file  . Food insecurity - worry: Not on file  . Food insecurity - inability: Not on file  . Transportation needs - medical: Not on file  . Transportation needs - non-medical: Not on file  Occupational History  . Not on file  Tobacco Use  . Smoking status: Never Smoker  . Smokeless tobacco: Never Used  Substance and Sexual Activity  . Alcohol use: No    Alcohol/week: 0.0 oz  . Drug use: Yes    Types: Marijuana  . Sexual activity: Yes    Partners: Male    Birth control/protection: None  Other Topics Concern  . Not on file  Social History Narrative  . Not on file    Physical Exam: BP (!) 113/52   Pulse (!) 114   Temp 98.3 F (36.8 C) (Oral)   Resp 18   LMP 02/14/2017   Gen: NAD CV: RRR Pulm: CTAB Abdomen: gravid, soft and mildly tender to palpation Pelvic: deferred Ext: no evidence of DVT  Consults: None  Significant Findings/ Diagnostic Studies: patient is leaving prior to follow up CBC  Procedures: NST  Discharge Condition: fair  Disposition: 01-Home or Self Care  Diet: Clear liquid diet, advance as tolerated  Discharge Activity: activity as tolerated, rest as needed  Discharge Instructions    Discharge activity:  No Restrictions   Complete by:  As directed    Rest as needed   Rest as needed   Discharge diet:   Complete by:  As directed    Adequate hydration, advance diet as tolerated   No sexual activity restrictions   Complete by:  As directed    Notify physician for a general feeling that "something is not right"    Complete by:  As directed    Notify physician for increase or change in vaginal discharge   Complete by:  As directed    Notify physician for intestinal cramps, with or without diarrhea, sometimes described as "gas pain"   Complete by:  As directed    Notify physician for leaking of fluid   Complete by:  As directed    Notify physician for low, dull backache, unrelieved by heat or Tylenol   Complete by:  As directed    Notify physician for menstrual like cramps   Complete by:  As directed    Notify physician for pelvic pressure   Complete by:  As directed    Notify physician for uterine contractions.  These may be painless and feel like the uterus is tightening or the baby is  "balling up"   Complete by:  As directed    Notify physician for vaginal bleeding   Complete by:  As directed    PRETERM LABOR:  Includes any of the follwing symptoms that occur between 20 - [redacted] weeks gestation.  If these symptoms are not stopped, preterm labor can result in preterm delivery, placing your baby at risk   Complete by:  As directed      Allergies as of 09/05/2017   No Known Allergies     Medication List    TAKE these medications   ondansetron 4 MG disintegrating tablet Commonly known as:  ZOFRAN ODT Take 1 tablet (4 mg total) by mouth every 8 (eight) hours as needed for nausea or vomiting.   PRENATAL VITAMIN PLUS LOW IRON 27-1 MG Tabs Take 1 tablet by mouth daily.   promethazine 25 MG tablet Commonly known as:  PHENERGAN Take 0.5 tablets (12.5 mg total) by mouth every 6 (six) hours as needed for nausea or vomiting.      Follow-up Information    Malachy Mood, MD Follow up in 1 week(s).   Specialty:  Obstetrics and Gynecology Contact information: [address] [place] [address] [phone]           Total time spent taking care of this patient: 30 minutes  Signed: Rod Can, CNM  09/05/2017, 2:31 PM

## 2017-09-05 NOTE — Discharge Summary (Signed)
Reviewed discharge instructions with patient. Gave opportunity for questions. Pt verbalized understanding. IV removed and patient discharged home with boyfriend.

## 2017-09-05 NOTE — Progress Notes (Signed)
The patient continues to have stomach pain, nausea and vomiting/dry heaves. She had no relief from the dose of phenergan that she received at 1046 this morning. She has not tried to take anything by mouth. She is now requesting to leave to go home. Explained to the patient that it will be better for her to stay here to receive IV hydration if she is unable to keep anything down by mouth. She just received a dose of IV Zofran and she is getting a fluid bolus. She is encouraged to try ice chips. She agrees to wait and see if there is any improvement from these measures.   Rod Can, CNM

## 2017-09-05 NOTE — Progress Notes (Signed)
S: "I don't feel good" Patient is actively vomiting and states her stomach hurts. She also admits some aching but attributes that to vomiting. She denies fever, chills, sore throat, headache, congestion, diarrhea or constipation. She admits to being around several family members who have been ill with vomiting and diarrhea. She admits positive fetal movement. She denies contractions, leaking of fluid or vaginal bleeding.  O: BP (!) 113/52   Pulse (!) 114   Temp 98.3 F (36.8 C) (Oral)   Resp 18   LMP 02/14/2017    General: appears in mild distress from vomiting Pulmonary: no increased work of breathing Abdomen: gravid, soft, mild tenderness  A: 22 yo female with IUP at [redacted]w[redacted]d with nausea and vomiting, viral gastroenteritis  P: Discontinue PO antiemetics IV antiemetics re-ordered Continue IV fluids Attempt PO intake when nausea is controlled Reinstate earlier discontinued order for transfer to antepartum Repeat CBC  Rod Can, CNM

## 2017-09-09 ENCOUNTER — Ambulatory Visit (INDEPENDENT_AMBULATORY_CARE_PROVIDER_SITE_OTHER): Payer: Medicaid Other | Admitting: Advanced Practice Midwife

## 2017-09-09 ENCOUNTER — Encounter: Payer: Self-pay | Admitting: Advanced Practice Midwife

## 2017-09-09 VITALS — BP 110/80 | Wt 141.0 lb

## 2017-09-09 DIAGNOSIS — Z3A29 29 weeks gestation of pregnancy: Secondary | ICD-10-CM

## 2017-09-09 DIAGNOSIS — Z3400 Encounter for supervision of normal first pregnancy, unspecified trimester: Secondary | ICD-10-CM

## 2017-09-09 NOTE — Progress Notes (Signed)
No [email or web address].rj 

## 2017-09-09 NOTE — Progress Notes (Signed)
Routine Prenatal Care Visit  Subjective  Tiffany Guzman is a 22 y.o. G1P0 at [redacted]w[redacted]d being seen today for ongoing prenatal care.  She is currently monitored for the following issues for this low-risk pregnancy and has Breast tenderness; Urinary incontinence; Anxiety; Supervision of normal first pregnancy, antepartum; Left ovarian cyst; Dermoid cyst of ovary affecting pregnancy, antepartum; and Abdominal pain during pregnancy, third trimester on their problem list.  ----------------------------------------------------------------------------------- Patient reports no complaints.  Patient feels much better now following her recent visit to the hospital. She is eating well. Phenergan is helping to decrease nausea. She has questions about preterm labor since her cousin recently delivered at 61 weeks. She is encouraged to stay hydrated. Also discussed the importance of treating any infections to help decrease the risk of preterm delivery.  Contractions: Not present. Vag. Bleeding: None.  Movement: Present. Denies leaking of fluid.  ----------------------------------------------------------------------------------- The following portions of the patient's history were reviewed and updated as appropriate: allergies, current medications, past family history, past medical history, past social history, past surgical history and problem list. Problem list updated.   Objective  Blood pressure 110/80, weight 141 lb (64 kg), last menstrual period 02/14/2017, unknown if currently breastfeeding. Pregravid weight 120 lb (54.4 kg) Total Weight Gain 21 lb (9.526 kg) Urinalysis:      Fetal Status: Fetal Heart Rate (bpm): 144 Fundal Height: 29 cm Movement: Present     General:  Alert, oriented and cooperative. Patient is in no acute distress.  Skin: Skin is warm and dry. No rash noted.   Cardiovascular: Normal heart rate noted  Respiratory: Normal respiratory effort, no problems with respiration noted    Abdomen: Soft, gravid, appropriate for gestational age. Pain/Pressure: Absent     Pelvic:  Cervical exam deferred        Extremities: Normal range of motion.  Edema: None  Mental Status: Normal mood and affect. Normal behavior. Normal judgment and thought content.   Assessment   22 y.o. G1P0 at [redacted]w[redacted]d by  11/21/2017, by Last Menstrual Period presenting for routine prenatal visit  Plan   pregnancy Problems (from 04/14/17 to present)    Problem Noted Resolved   Left ovarian cyst 05/20/2017 by Will Bonnet, MD No   Supervision of normal first pregnancy, antepartum 04/14/2017 by Rod Can, CNM No   Overview Addendum 09/04/2017  5:14 PM by Malachy Mood, [place] Prenatal Labs  Dating L=8 Blood type: B/Positive/-- (09/24 1426)   Genetic Screen 1 Screen: [X]  10/30 negative Antibody:Negative (09/24 1426)  Anatomic [place] Normal female, bilateral dermoids left 8.80 x .8.6cm Rubella: 1.08 (09/24 1426) Varicella:    GTT 82 RPR: Non Reactive (09/24 1426)   Rhogam N/A HBsAg: Negative (09/24 1426)   TDaP vaccine                        Flu Shot: DECLINES HIV:   Negative  Baby Food                                GBS:   Contraception  Pap: NIL HPV positive  CBB     CS/VBAC    Support Person          Nausea and vomiting during pregnancy 05/12/2017 by Will Bonnet, MD 08/26/2017 by Malachy Mood, MD       Preterm labor symptoms and general obstetric precautions including but not limited to  vaginal bleeding, contractions, leaking of fluid and fetal movement were reviewed in detail with the patient. Please refer to After Visit Summary for other counseling recommendations.   Return in about 2 weeks (around 09/23/2017) for rob.  Rod Can, CNM  09/09/2017 10:24 AM

## 2017-09-09 NOTE — Patient Instructions (Signed)
Third Trimester of Pregnancy The third trimester is from week 28 through week 40 (months 7 through 9). The third trimester is a time when the unborn baby (fetus) is growing rapidly. At the end of the ninth month, the fetus is about 20 inches in length and weighs 6-10 pounds. Body changes during your third trimester Your body will continue to go through many changes during pregnancy. The changes vary from woman to woman. During the third trimester:  Your weight will continue to increase. You can expect to gain 25-35 pounds (11-16 kg) by the end of the pregnancy.  You may begin to get stretch marks on your hips, abdomen, and breasts.  You may urinate more often because the fetus is moving lower into your pelvis and pressing on your bladder.  You may develop or continue to have heartburn. This is caused by increased hormones that slow down muscles in the digestive tract.  You may develop or continue to have constipation because increased hormones slow digestion and cause the muscles that push waste through your intestines to relax.  You may develop hemorrhoids. These are swollen veins (varicose veins) in the rectum that can itch or be painful.  You may develop swollen, bulging veins (varicose veins) in your legs.  You may have increased body aches in the pelvis, back, or thighs. This is due to weight gain and increased hormones that are relaxing your joints.  You may have changes in your hair. These can include thickening of your hair, rapid growth, and changes in texture. Some women also have hair loss during or after pregnancy, or hair that feels dry or thin. Your hair will most likely return to normal after your baby is born.  Your breasts will continue to grow and they will continue to become tender. A yellow fluid (colostrum) may leak from your breasts. This is the first milk you are producing for your baby.  Your belly button may stick out.  You may notice more swelling in your hands,  face, or ankles.  You may have increased tingling or numbness in your hands, arms, and legs. The skin on your belly may also feel numb.  You may feel short of breath because of your expanding uterus.  You may have more problems sleeping. This can be caused by the size of your belly, increased need to urinate, and an increase in your body's metabolism.  You may notice the fetus "dropping," or moving lower in your abdomen (lightening).  You may have increased vaginal discharge.  You may notice your joints feel loose and you may have pain around your pelvic bone.  What to expect at prenatal visits You will have prenatal exams every 2 weeks until week 36. Then you will have weekly prenatal exams. During a routine prenatal visit:  You will be weighed to make sure you and the baby are growing normally.  Your blood pressure will be taken.  Your abdomen will be measured to track your baby's growth.  The fetal heartbeat will be listened to.  Any test results from the previous visit will be discussed.  You may have a cervical check near your due date to see if your cervix has softened or thinned (effaced).  You will be tested for Group B streptococcus. This happens between 35 and 37 weeks.  Your health care provider may ask you:  What your birth plan is.  How you are feeling.  If you are feeling the baby move.  If you have had   any abnormal symptoms, such as leaking fluid, bleeding, severe headaches, or abdominal cramping.  If you are using any tobacco products, including cigarettes, chewing tobacco, and electronic cigarettes.  If you have any questions.  Other tests or screenings that may be performed during your third trimester include:  Blood tests that check for low iron levels (anemia).  Fetal testing to check the health, activity level, and growth of the fetus. Testing is done if you have certain medical conditions or if there are problems during the  pregnancy.  Nonstress test (NST). This test checks the health of your baby to make sure there are no signs of problems, such as the baby not getting enough oxygen. During this test, a belt is placed around your belly. The baby is made to move, and its heart rate is monitored during movement.  What is false labor? False labor is a condition in which you feel small, irregular tightenings of the muscles in the womb (contractions) that usually go away with rest, changing position, or drinking water. These are called Braxton Hicks contractions. Contractions may last for hours, days, or even weeks before true labor sets in. If contractions come at regular intervals, become more frequent, increase in intensity, or become painful, you should see your health care provider. What are the signs of labor?  Abdominal cramps.  Regular contractions that start at 10 minutes apart and become stronger and more frequent with time.  Contractions that start on the top of the uterus and spread down to the lower abdomen and back.  Increased pelvic pressure and dull back pain.  A watery or bloody mucus discharge that comes from the vagina.  Leaking of amniotic fluid. This is also known as your "water breaking." It could be a slow trickle or a gush. Let your health care provider know if it has a color or strange odor. If you have any of these signs, call your health care provider right away, even if it is before your due date. Follow these instructions at home: Medicines  Follow your health care provider's instructions regarding medicine use. Specific medicines may be either safe or unsafe to take during pregnancy.  Take a prenatal vitamin that contains at least 600 micrograms (mcg) of folic acid.  If you develop constipation, try taking a stool softener if your health care provider approves. Eating and drinking  Eat a balanced diet that includes fresh fruits and vegetables, whole grains, good sources of protein  such as meat, eggs, or tofu, and low-fat dairy. Your health care provider will help you determine the amount of weight gain that is right for you.  Avoid raw meat and uncooked cheese. These carry germs that can cause birth defects in the baby.  If you have low calcium intake from food, talk to your health care provider about whether you should take a daily calcium supplement.  Eat four or five small meals rather than three large meals a day.  Limit foods that are high in fat and processed sugars, such as fried and sweet foods.  To prevent constipation: ? Drink enough fluid to keep your urine clear or pale yellow. ? Eat foods that are high in fiber, such as fresh fruits and vegetables, whole grains, and beans. Activity  Exercise only as directed by your health care provider. Most women can continue their usual exercise routine during pregnancy. Try to exercise for 30 minutes at least 5 days a week. Stop exercising if you experience uterine contractions.  Avoid heavy   lifting.  Do not exercise in extreme heat or humidity, or at high altitudes.  Wear low-heel, comfortable shoes.  Practice good posture.  You may continue to have sex unless your health care provider tells you otherwise. Relieving pain and discomfort  Take frequent breaks and rest with your legs elevated if you have leg cramps or low back pain.  Take warm sitz baths to soothe any pain or discomfort caused by hemorrhoids. Use hemorrhoid cream if your health care provider approves.  Wear a good support bra to prevent discomfort from breast tenderness.  If you develop varicose veins: ? Wear support pantyhose or compression stockings as told by your healthcare provider. ? Elevate your feet for 15 minutes, 3-4 times a day. Prenatal care  Write down your questions. Take them to your prenatal visits.  Keep all your prenatal visits as told by your health care provider. This is important. Safety  Wear your seat belt at  all times when driving.  Make a list of emergency phone numbers, including numbers for family, friends, the hospital, and police and fire departments. General instructions  Avoid cat litter boxes and soil used by cats. These carry germs that can cause birth defects in the baby. If you have a cat, ask someone to clean the litter box for you.  Do not travel far distances unless it is absolutely necessary and only with the approval of your health care provider.  Do not use hot tubs, steam rooms, or saunas.  Do not drink alcohol.  Do not use any products that contain nicotine or tobacco, such as cigarettes and e-cigarettes. If you need help quitting, ask your health care provider.  Do not use any medicinal herbs or unprescribed drugs. These chemicals affect the formation and growth of the baby.  Do not douche or use tampons or scented sanitary pads.  Do not cross your legs for long periods of time.  To prepare for the arrival of your baby: ? Take prenatal classes to understand, practice, and ask questions about labor and delivery. ? Make a trial run to the hospital. ? Visit the hospital and tour the maternity area. ? Arrange for maternity or paternity leave through employers. ? Arrange for family and friends to take care of pets while you are in the hospital. ? Purchase a rear-facing car seat and make sure you know how to install it in your car. ? Pack your hospital bag. ? Prepare the baby's nursery. Make sure to remove all pillows and stuffed animals from the baby's crib to prevent suffocation.  Visit your dentist if you have not gone during your pregnancy. Use a soft toothbrush to brush your teeth and be gentle when you floss. Contact a health care provider if:  You are unsure if you are in labor or if your water has broken.  You become dizzy.  You have mild pelvic cramps, pelvic pressure, or nagging pain in your abdominal area.  You have lower back pain.  You have persistent  nausea, vomiting, or diarrhea.  You have an unusual or bad smelling vaginal discharge.  You have pain when you urinate. Get help right away if:  Your water breaks before 37 weeks.  You have regular contractions less than 5 minutes apart before 37 weeks.  You have a fever.  You are leaking fluid from your vagina.  You have spotting or bleeding from your vagina.  You have severe abdominal pain or cramping.  You have rapid weight loss or weight gain.    You have shortness of breath with chest pain.  You notice sudden or extreme swelling of your face, hands, ankles, feet, or legs.  Your baby makes fewer than 10 movements in 2 hours.  You have severe headaches that do not go away when you take medicine.  You have vision changes. Summary  The third trimester is from week 28 through week 40, months 7 through 9. The third trimester is a time when the unborn baby (fetus) is growing rapidly.  During the third trimester, your discomfort may increase as you and your baby continue to gain weight. You may have abdominal, leg, and back pain, sleeping problems, and an increased need to urinate.  During the third trimester your breasts will keep growing and they will continue to become tender. A yellow fluid (colostrum) may leak from your breasts. This is the first milk you are producing for your baby.  False labor is a condition in which you feel small, irregular tightenings of the muscles in the womb (contractions) that eventually go away. These are called Braxton Hicks contractions. Contractions may last for hours, days, or even weeks before true labor sets in.  Signs of labor can include: abdominal cramps; regular contractions that start at 10 minutes apart and become stronger and more frequent with time; watery or bloody mucus discharge that comes from the vagina; increased pelvic pressure and dull back pain; and leaking of amniotic fluid. This information is not intended to replace advice  given to you by your health care provider. Make sure you discuss any questions you have with your health care provider. Document Released: 07/02/2001 Document Revised: 12/14/2015 Document Reviewed: 09/08/2012 Elsevier Interactive Patient Education  2017 [employer]  

## 2017-09-10 LAB — CBC WITH DIFFERENTIAL/PLATELET
BASOS: 0 %
Basophils Absolute: 0 10*3/uL (ref 0.0–0.2)
EOS (ABSOLUTE): 0.1 10*3/uL (ref 0.0–0.4)
EOS: 1 %
HEMATOCRIT: 30.5 % — AB (ref 34.0–46.6)
HEMOGLOBIN: 10.3 g/dL — AB (ref 11.1–15.9)
IMMATURE GRANS (ABS): 0.1 10*3/uL (ref 0.0–0.1)
Immature Granulocytes: 1 %
LYMPHS ABS: 1.8 10*3/uL (ref 0.7–3.1)
Lymphs: 18 %
MCH: 28.8 pg (ref 26.6–33.0)
MCHC: 33.8 g/dL (ref 31.5–35.7)
MCV: 85 fL (ref 79–97)
MONOCYTES: 5 %
Monocytes Absolute: 0.5 10*3/uL (ref 0.1–0.9)
NEUTROS ABS: 7.7 10*3/uL — AB (ref 1.4–7.0)
Neutrophils: 75 %
Platelets: 234 10*3/uL (ref 150–379)
RBC: 3.58 x10E6/uL — ABNORMAL LOW (ref 3.77–5.28)
RDW: 13.5 % (ref 12.3–15.4)
WBC: 10.1 10*3/uL (ref 3.4–10.8)

## 2017-09-13 LAB — URINE DRUG PANEL 7
Amphetamines, Urine: NEGATIVE ng/mL
BARBITURATE QUANT UR: NEGATIVE ng/mL
BENZODIAZEPINE QUANT UR: NEGATIVE ng/mL
COCAINE (METAB.): NEGATIVE ng/mL
Cannabinoid Quant, Ur: POSITIVE — AB
OPIATE QUANT UR: NEGATIVE ng/mL
PCP Quant, Ur: NEGATIVE ng/mL

## 2017-09-23 ENCOUNTER — Encounter: Payer: Self-pay | Admitting: Obstetrics and Gynecology

## 2017-09-23 ENCOUNTER — Ambulatory Visit (INDEPENDENT_AMBULATORY_CARE_PROVIDER_SITE_OTHER): Payer: Medicaid Other | Admitting: Obstetrics and Gynecology

## 2017-09-23 ENCOUNTER — Encounter: Payer: Medicaid Other | Admitting: Obstetrics and Gynecology

## 2017-09-23 VITALS — BP 118/80 | Wt 138.0 lb

## 2017-09-23 DIAGNOSIS — N898 Other specified noninflammatory disorders of vagina: Secondary | ICD-10-CM | POA: Diagnosis not present

## 2017-09-23 DIAGNOSIS — J00 Acute nasopharyngitis [common cold]: Secondary | ICD-10-CM

## 2017-09-23 DIAGNOSIS — Z3A31 31 weeks gestation of pregnancy: Secondary | ICD-10-CM | POA: Diagnosis not present

## 2017-09-23 DIAGNOSIS — Z23 Encounter for immunization: Secondary | ICD-10-CM

## 2017-09-23 DIAGNOSIS — Z3400 Encounter for supervision of normal first pregnancy, unspecified trimester: Secondary | ICD-10-CM

## 2017-09-23 LAB — POCT WET PREP WITH KOH
Clue Cells Wet Prep HPF POC: NEGATIVE
KOH PREP POC: NEGATIVE
TRICHOMONAS UA: NEGATIVE
YEAST WET PREP PER HPF POC: NEGATIVE

## 2017-09-23 MED ORDER — TERCONAZOLE 0.8 % VA CREA: 1.0000 | TOPICAL_CREAM | Freq: Every day | VAGINAL | 1 refills | Status: AC

## 2017-09-23 NOTE — Progress Notes (Addendum)
  Subjective  Fetal Movement? yes Contractions? Yes--mild BH Leaking Fluid? no Vaginal Bleeding? no PNVs? Yes  Decreased NVP, takes phenergan prn with sx control.  Has increased vag d/c, itching/irritation, no odor for 4-5 days. No recent abx use. No meds to treat. Also has URI sx of sore throat, congestion, cough. No meds taken for sx.  Objective  BP 118/80   Wt 138 lb (62.6 kg)   LMP 02/14/2017   BMI 23.69 kg/m  General: NAD Pulmonary: no increased work of breathing Abdomen: gravid, non-tender Extremities: no edema Psychiatric: mood appropriate, affect full  Results for orders placed or performed in visit on 09/23/17 (from the past 24 hour(s))  POCT Wet Prep with KOH     Status: Normal   Collection Time: 09/23/17 10:37 AM  Result Value Ref Range   Trichomonas, UA Negative    Clue Cells Wet Prep HPF POC neg    Epithelial Wet Prep HPF POC  Few, Moderate, Many, Too numerous to count   Yeast Wet Prep HPF POC neg    Bacteria Wet Prep HPF POC  Few   RBC Wet Prep HPF POC     WBC Wet Prep HPF POC     KOH Prep POC Negative Negative     Assessment  22 y.o. G1P0 at [redacted]w[redacted]d by  11/21/2017, by Last Menstrual Period presenting for routine prenatal visit  [redacted] weeks gestation of pregnancy - Plan: Tdap vaccine greater than or equal to 7yo IM  Supervision of normal first pregnancy, antepartum  Need for vaccination - Plan: Tdap vaccine greater than or equal to 7yo IM  Vaginal itching - Neg wet prep/pos exam. Rx terazol. F/u prn.  - Plan: POCT Wet Prep with KOH, terconazole (TERAZOL 3) 0.8 % vaginal cream  Acute nasopharyngitis - Sudafed/Robitussin DM/rest/fluids.   Plan   RTO 2  weeks  Alicia B. Copland, PA-C [facility],  09/23/2017  10:35 AM

## 2017-09-23 NOTE — Progress Notes (Signed)
ROB  Pt would like to ask about possible yeast infection.  TDAP today  Blood transfusion consent

## 2017-09-23 NOTE — Patient Instructions (Signed)
I value your feedback and entrusting us with your care. If you get a [facility] patient survey, I would appreciate you taking the time to let us know about your experience today. Thank you! 

## 2017-10-01 ENCOUNTER — Inpatient Hospital Stay
Admission: EM | Admit: 2017-10-01 | Discharge: 2017-10-07 | DRG: 819 | Disposition: A | Discharge status: Home / Self Care | Payer: Medicaid Other | Attending: Obstetrics and Gynecology | Admitting: Obstetrics and Gynecology

## 2017-10-01 ENCOUNTER — Inpatient Hospital Stay: Payer: Medicaid Other

## 2017-10-01 DIAGNOSIS — O99013 Anemia complicating pregnancy, third trimester: Secondary | ICD-10-CM | POA: Diagnosis present

## 2017-10-01 DIAGNOSIS — O26893 Other specified pregnancy related conditions, third trimester: Secondary | ICD-10-CM

## 2017-10-01 DIAGNOSIS — Z3A32 32 weeks gestation of pregnancy: Secondary | ICD-10-CM

## 2017-10-01 DIAGNOSIS — D271 Benign neoplasm of left ovary: Secondary | ICD-10-CM | POA: Diagnosis present

## 2017-10-01 DIAGNOSIS — R197 Diarrhea, unspecified: Secondary | ICD-10-CM

## 2017-10-01 DIAGNOSIS — M549 Dorsalgia, unspecified: Secondary | ICD-10-CM

## 2017-10-01 DIAGNOSIS — D509 Iron deficiency anemia, unspecified: Secondary | ICD-10-CM | POA: Diagnosis present

## 2017-10-01 DIAGNOSIS — E876 Hypokalemia: Secondary | ICD-10-CM | POA: Diagnosis present

## 2017-10-01 DIAGNOSIS — Z3400 Encounter for supervision of normal first pregnancy, unspecified trimester: Secondary | ICD-10-CM

## 2017-10-01 DIAGNOSIS — N83202 Unspecified ovarian cyst, left side: Secondary | ICD-10-CM

## 2017-10-01 DIAGNOSIS — D369 Benign neoplasm, unspecified site: Secondary | ICD-10-CM | POA: Diagnosis present

## 2017-10-01 DIAGNOSIS — R109 Unspecified abdominal pain: Secondary | ICD-10-CM

## 2017-10-01 DIAGNOSIS — D279 Benign neoplasm of unspecified ovary: Secondary | ICD-10-CM

## 2017-10-01 DIAGNOSIS — O26899 Other specified pregnancy related conditions, unspecified trimester: Secondary | ICD-10-CM

## 2017-10-01 DIAGNOSIS — Z3493 Encounter for supervision of normal pregnancy, unspecified, third trimester: Secondary | ICD-10-CM

## 2017-10-01 DIAGNOSIS — Z3490 Encounter for supervision of normal pregnancy, unspecified, unspecified trimester: Secondary | ICD-10-CM

## 2017-10-01 DIAGNOSIS — O99613 Diseases of the digestive system complicating pregnancy, third trimester: Secondary | ICD-10-CM | POA: Diagnosis present

## 2017-10-01 DIAGNOSIS — O133 Gestational [pregnancy-induced] hypertension without significant proteinuria, third trimester: Secondary | ICD-10-CM | POA: Diagnosis present

## 2017-10-01 DIAGNOSIS — O3483 Maternal care for other abnormalities of pelvic organs, third trimester: Secondary | ICD-10-CM | POA: Diagnosis not present

## 2017-10-01 DIAGNOSIS — O99891 Other specified diseases and conditions complicating pregnancy: Secondary | ICD-10-CM

## 2017-10-01 DIAGNOSIS — K219 Gastro-esophageal reflux disease without esophagitis: Secondary | ICD-10-CM | POA: Diagnosis present

## 2017-10-01 DIAGNOSIS — R112 Nausea with vomiting, unspecified: Secondary | ICD-10-CM

## 2017-10-01 DIAGNOSIS — O9989 Other specified diseases and conditions complicating pregnancy, childbirth and the puerperium: Secondary | ICD-10-CM

## 2017-10-01 LAB — CBC
HCT: 35.4 % (ref 35.0–47.0)
Hemoglobin: 12.1 g/dL (ref 12.0–16.0)
MCH: 28.6 pg (ref 26.0–34.0)
MCHC: 34.1 g/dL (ref 32.0–36.0)
MCV: 83.9 fL (ref 80.0–100.0)
PLATELETS: 225 10*3/uL (ref 150–440)
RBC: 4.22 MIL/uL (ref 3.80–5.20)
RDW: 13.7 % (ref 11.5–14.5)
WBC: 13.2 10*3/uL — AB (ref 3.6–11.0)

## 2017-10-01 LAB — COMPREHENSIVE METABOLIC PANEL
ALBUMIN: 3.3 g/dL — AB (ref 3.5–5.0)
ALT: 13 U/L — ABNORMAL LOW (ref 14–54)
ANION GAP: 15 (ref 5–15)
AST: 35 U/L (ref 15–41)
Alkaline Phosphatase: 112 U/L (ref 38–126)
BILIRUBIN TOTAL: 0.7 mg/dL (ref 0.3–1.2)
BUN: 8 mg/dL (ref 6–20)
CHLORIDE: 106 mmol/L (ref 101–111)
CO2: 17 mmol/L — ABNORMAL LOW (ref 22–32)
Calcium: 8.4 mg/dL — ABNORMAL LOW (ref 8.9–10.3)
Creatinine, Ser: 0.66 mg/dL (ref 0.44–1.00)
GFR calc Af Amer: >60 mL/min (ref >60)
GFR calc non Af Amer: >60 mL/min (ref >60)
GLUCOSE: 88 mg/dL (ref 65–99)
POTASSIUM: 3.2 mmol/L — AB (ref 3.5–5.1)
Sodium: 138 mmol/L (ref 135–145)
TOTAL PROTEIN: 7.1 g/dL (ref 6.5–8.1)

## 2017-10-01 LAB — MAGNESIUM: Magnesium: 1.4 mg/dL — ABNORMAL LOW (ref 1.7–2.4)

## 2017-10-01 LAB — LIPASE, BLOOD: LIPASE: 41 U/L (ref 11–51)

## 2017-10-01 MED ORDER — MAGNESIUM SULFATE 2 GM/50ML IV SOLN
2.0000 g | Freq: Once | INTRAVENOUS | Status: AC
  Administered 2017-10-01: 2 g via INTRAVENOUS
  Filled 2017-10-01: qty 50

## 2017-10-01 MED ORDER — BETAMETHASONE SOD PHOS & ACET 6 (3-3) MG/ML IJ SUSP
INTRAMUSCULAR | Status: AC
  Administered 2017-10-01: 12 mg via INTRAMUSCULAR
  Filled 2017-10-01: qty 1

## 2017-10-01 MED ORDER — PROMETHAZINE HCL 25 MG/ML IJ SOLN
12.5000 mg | Freq: Four times a day (QID) | INTRAMUSCULAR | Status: DC | PRN
  Administered 2017-10-01 – 2017-10-03 (×4): 12.5 mg via INTRAVENOUS
  Filled 2017-10-01 (×4): qty 1

## 2017-10-01 MED ORDER — SODIUM CHLORIDE 0.9 % IV BOLUS (SEPSIS)
1000.0000 mL | Freq: Once | INTRAVENOUS | Status: AC
  Administered 2017-10-01: 1000 mL via INTRAVENOUS

## 2017-10-01 MED ORDER — ONDANSETRON HCL 4 MG/2ML IJ SOLN
INTRAMUSCULAR | Status: AC
Start: 2017-10-01 — End: 2017-10-02
  Filled 2017-10-01: qty 2

## 2017-10-01 MED ORDER — CALCIUM CARBONATE ANTACID 500 MG PO CHEW: 2.0000 | CHEWABLE_TABLET | ORAL | Status: DC | PRN

## 2017-10-01 MED ORDER — PRENATAL MULTIVITAMIN CH: 1.0000 | ORAL_TABLET | Freq: Every day | ORAL | Status: DC

## 2017-10-01 MED ORDER — PROMETHAZINE HCL 25 MG/ML IJ SOLN
INTRAMUSCULAR | Status: AC
  Administered 2017-10-01: 12.5 mg via INTRAVENOUS
  Filled 2017-10-01: qty 1

## 2017-10-01 MED ORDER — PROMETHAZINE HCL 25 MG/ML IJ SOLN
12.5000 mg | Freq: Once | INTRAMUSCULAR | Status: AC
  Administered 2017-10-01: 12.5 mg via INTRAVENOUS
  Filled 2017-10-01: qty 1

## 2017-10-01 MED ORDER — ONDANSETRON HCL 4 MG/2ML IJ SOLN: 4.0000 mg | Freq: Four times a day (QID) | INTRAMUSCULAR | Status: DC | PRN

## 2017-10-01 MED ORDER — LACTATED RINGERS IV SOLN
INTRAVENOUS | Status: DC
  Administered 2017-10-01: 17:00:00 via INTRAVENOUS

## 2017-10-01 MED ORDER — DEXTROSE IN LACTATED RINGERS 5 % IV SOLN
INTRAVENOUS | Status: DC
  Administered 2017-10-01 – 2017-10-02 (×3): via INTRAVENOUS

## 2017-10-01 MED ORDER — PROMETHAZINE HCL 25 MG/ML IJ SOLN
12.5000 mg | Freq: Once | INTRAMUSCULAR | Status: AC
  Administered 2017-10-01: 12.5 mg via INTRAMUSCULAR

## 2017-10-01 MED ORDER — BETAMETHASONE SOD PHOS & ACET 6 (3-3) MG/ML IJ SUSP
12.0000 mg | INTRAMUSCULAR | Status: AC
  Administered 2017-10-01 – 2017-10-02 (×2): 12 mg via INTRAMUSCULAR
  Filled 2017-10-01 (×2): qty 2

## 2017-10-01 MED ORDER — ACETAMINOPHEN 325 MG PO TABS
650.0000 mg | ORAL_TABLET | ORAL | Status: DC | PRN
  Administered 2017-10-02: 650 mg via ORAL
  Filled 2017-10-01 (×2): qty 2

## 2017-10-01 MED ORDER — ONDANSETRON HCL 4 MG/2ML IJ SOLN
4.0000 mg | Freq: Once | INTRAMUSCULAR | Status: AC
  Administered 2017-10-01: 4 mg via INTRAVENOUS

## 2017-10-01 MED ORDER — PROMETHAZINE HCL 25 MG/ML IJ SOLN
INTRAMUSCULAR | Status: AC
  Administered 2017-10-01: 12.5 mg via INTRAMUSCULAR
  Filled 2017-10-01: qty 1

## 2017-10-01 MED ORDER — BUTORPHANOL TARTRATE 1 MG/ML IJ SOLN
1.0000 mg | Freq: Once | INTRAMUSCULAR | Status: AC
  Administered 2017-10-01: 1 mg via INTRAVENOUS
  Filled 2017-10-01: qty 1

## 2017-10-01 MED ORDER — SODIUM CHLORIDE 0.9 % IJ SOLN
INTRAMUSCULAR | Status: AC
  Filled 2017-10-01: qty 50

## 2017-10-01 MED ORDER — ZOLPIDEM TARTRATE 5 MG PO TABS: 5.0000 mg | ORAL_TABLET | Freq: Every evening | ORAL | Status: DC | PRN

## 2017-10-01 NOTE — ED Provider Notes (Signed)
[facility] Emergency Department Provider Note ____________________________________________   First MD Initiated Contact with Patient 10/01/17 1355     (approximate)  I have reviewed the triage vital signs and the nursing notes.  HISTORY  Chief Complaint Emesis    HPI Tiffany Guzman is a 22 y.o. female who reports she is [redacted] weeks pregnant.  For the last 2 days she is been experiencing nausea vomiting and frequent loose stools.  She is continued to feel like her stomach is sore and she is at ongoing vomiting.  She did not take any medications at home.  EMS administered 4 mg Zofran, she continues to feel severely nauseated with vomiting.  Also experiencing pain which she described as crampy pain throughout her abdomen.  No fevers.  Reports one other contact in the household has been sick with something similar.  No lightheadedness or weakness.  Reports she cannot keep anything on her stomach without vomiting.  Does not feel a sensation as though she needs to push and denies having contractions.   Past Medical History:  Diagnosis Date  . Attention deficit hyperactivity disorder (ADHD)   . Chronic upper back pain   . GERD (gastroesophageal reflux disease)   . Migraine with aura    since elementary school    Patient Active Problem List   Diagnosis Date Noted  . Dermoid cyst of ovary affecting pregnancy, antepartum 09/04/2017  . Abdominal pain during pregnancy, third trimester 09/04/2017  . Left ovarian cyst 05/20/2017  . Supervision of normal first pregnancy, antepartum 04/14/2017  . Breast tenderness 06/24/2016  . Urinary incontinence 06/24/2016  . Anxiety 06/24/2016    Past Surgical History:  Procedure Laterality Date  . INSERTION OF NON VAGINAL CONTRACEPTIVE DEVICE      Prior to Admission medications   Medication Sig Start Date End Date Taking? Authorizing Provider  ondansetron (ZOFRAN ODT) 4 MG disintegrating tablet Take 1 tablet (4 mg  total) by mouth every 8 (eight) hours as needed for nausea or vomiting. 09/05/17   Rod Can, CNM  Prenatal Vit-Fe Fumarate-FA (PRENATAL VITAMIN PLUS LOW IRON) 27-1 MG TABS Take 1 tablet by mouth daily. 08/05/17   Will Bonnet, MD  promethazine (PHENERGAN) 25 MG tablet Take 0.5 tablets (12.5 mg total) by mouth every 6 (six) hours as needed for nausea or vomiting. Patient not taking: Reported on 09/23/2017 09/05/17   Rod Can, CNM    Allergies Patient has no known allergies.  Family History  Problem Relation Age of Onset  . Hypertension Mother   . Hypothyroidism Mother   . Depression Mother   . Diabetes Maternal Grandmother     Social History Social History   Tobacco Use  . Smoking status: Never Smoker  . Smokeless tobacco: Never Used  Substance Use Topics  . Alcohol use: No    Alcohol/week: 0.0 oz  . Drug use: Yes    Types: Marijuana    Review of Systems Constitutional: No fever/chills Eyes: No visual changes. ENT: No sore throat. Cardiovascular: Denies chest pain. Respiratory: Denies shortness of breath. Gastrointestinal:  No constipation. Genitourinary: Negative for dysuria.  Musculoskeletal: Negative for back pain. Skin: Negative for rash. Neurological: Negative for headaches, focal weakness or numbness.    ____________________________________________   PHYSICAL EXAM:  VITAL SIGNS: ED Triage Vitals  Enc Vitals Group     BP      Pulse      Resp      Temp      Temp src  SpO2      Weight      Height      Head Circumference      Peak Flow      Pain Score      Pain Loc      Pain Edu?      Excl. in [place]?     Constitutional: Alert and oriented.  Sitting upright actively vomiting into emesis bag.  yellow appearing emesis.  No bilious emesis.  Eyes: Conjunctivae are normal. Head: Atraumatic. Nose: No congestion/rhinnorhea. Mouth/Throat: Mucous membranes are moist. Neck: No stridor.   Cardiovascular: Normal rate, regular rhythm. Grossly  normal heart sounds.  Good peripheral circulation. Respiratory: Normal respiratory effort.  No retractions. Lungs CTAB. Gastrointestinal: Soft and gravid, fundus approximately at the level of the umbilicus.  She reports moderate tenderness throughout.  No frank peritonitis, no rebound or guarding denoted.  No focal pain in the right upper quadrant right lower quadrant.  Patient reports moderate tenderness throughout the abdomen.  Normal bowel sounds. Pelvic: Pelvic exam performed with nurse, Juliann Pulse, using sterile gloves and sterile technique.  The patient is not noted to be crowning, not easily AB to palpate the cervix, but there is no evidence to support imminent delivery, the head does not feel engaged birthing track. Musculoskeletal: No lower extremity tenderness nor edema. Neurologic:  Normal speech and language. No gross focal neurologic deficits are appreciated.  Skin:  Skin is warm, dry and intact. No rash noted. Psychiatric: Mood and affect are normal. Speech and behavior are normal.  ____________________________________________   LABS (all labs ordered are listed, but only abnormal results are displayed)  Labs Reviewed  CBC - Abnormal; Notable for the following components:      Result Value   WBC 13.2 (*)    All other components within normal limits  COMPREHENSIVE METABOLIC PANEL - Abnormal; Notable for the following components:   Potassium 3.2 (*)    CO2 17 (*)    Calcium 8.4 (*)    Albumin 3.3 (*)    ALT 13 (*)    All other components within normal limits  LIPASE, BLOOD  URINALYSIS, COMPLETE (UACMP) WITH MICROSCOPIC   ____________________________________________  EKG   ____________________________________________  RADIOLOGY   ____________________________________________   PROCEDURES  Procedure(s) performed: None  Procedures  Critical Care performed: No  ____________________________________________   INITIAL IMPRESSION / ASSESSMENT AND PLAN / ED  COURSE  Pertinent labs & imaging results that were available during my care of the patient were reviewed by me and considered in my medical decision making (see chart for details).  Patient resents for evaluation of nausea vomiting diffuse abdominal discomfort cramps and loose stool for about 48 hours.  She has frequent emesis in the ER, also did noted to be [redacted] weeks pregnant.  Fetal heart tones 132 per RN.   Clinical Course as of Oct 01 1444  Wed Oct 01, 2017  1408 Irena Cords MD paged for consult.  [MQ]    Clinical Course User Index [MQ] Delman Kitten, MD   Discussed case with Dr. Barnett Applebaum of [facility], he recommends the patient be transferred to labor and delivery for further evaluation under his care.  Patient is agreeable with this plan, nausea slightly improved after medications and hydration initiated in the ER which will be continued for her in labor and delivery.  ____________________________________________   FINAL CLINICAL IMPRESSION(S) / ED DIAGNOSES  Final diagnoses:  Nausea vomiting and diarrhea  Third trimester pregnancy  NEW MEDICATIONS STARTED DURING THIS VISIT:  Current Discharge Medication List       Note:  This document was prepared using Dragon voice recognition software and may include unintentional dictation errors.     Delman Kitten, MD 10/01/17 [phone]

## 2017-10-01 NOTE — OB Triage Note (Signed)
Patient states she has been vomiting for the past 2-3 nights.  Denies leaking of fluid, vaginal bleeding or decreased fetal movement.

## 2017-10-01 NOTE — H&P (Signed)
OB History & Physical   History of Present Illness:  Chief Complaint:  Abdominal pain and nausea and vomiting since 0300 this Am. HPI:  Tiffany Guzman is a 21 y.o. G1P0 female with EDC=11/21/2017 at [redacted]w[redacted]d dated by LMP/8week ultrasound.  She presents to L&D after being evaluated in the ER for abdominal pain and nausea and vomiting.  She received 1 liter of fluid, Zofran 8 mgm and Phenergan 12.5mg m and is still having nausea and vomiting. She reports that she awoke with pain all over her abdomen then began vomiting after that. Last ate last night before bed. Has had 3-4 loose stools this AM. Had a similar episode last month and an MRI was negative for appendicitis or gall stones. There was a dermoid cyst on her left ovary measuring 8x9 cm. This was discovered earlier in her pregnancy at 13 week ultrasound. Another small dermoid <2cm in size was see in the right ovary at the time of  her anatomy scan She reports having some spotting a couple days ago after having intercourse, and she noticed a yellowish brown discharge this AM. No vulvar itching or irritation.   Her pregnancy has also been complicated by anxiety, GERD, migraine with aura and ADHD.Marland Kitchen     Prenatal care site: Prenatal care at [facility] has also  been remarkable for  [facility] Prenatal Labs  Dating L=8 Blood type: B/Positive/-- (09/24 1426)   Genetic Screen 1 Screen: [X]  10/30 negative Antibody:Negative (09/24 1426)  Anatomic [place] Normal female, bilateral dermoids left 8.80 x .8.6cm Rubella: 1.08 (09/24 1426) Varicella: Immune   GTT 82 RPR: Non Reactive (09/24 1426)   Rhogam N/A HBsAg: Negative (09/24 1426)   TDaP vaccine                        Flu Shot: DECLINES HIV:   Negative  Baby Food                                GBS:   Contraception  Pap: NIL HPV positive  CBB     CS/VBAC    Support Person         Maternal Medical History:   Past Medical History:  Diagnosis Date  . Attention deficit hyperactivity disorder (ADHD)    . Chronic upper back pain   . GERD (gastroesophageal reflux disease)   . Migraine with aura    since elementary school    Past Surgical History:  Procedure Laterality Date  . INSERTION OF NON VAGINAL CONTRACEPTIVE DEVICE      No Known Allergies  Prior to Admission medications   Medication Sig Start Date End Date Taking? Authorizing Provider  ondansetron (ZOFRAN ODT) 4 MG disintegrating tablet Take 1 tablet (4 mg total) by mouth every 8 (eight) hours as needed for nausea or vomiting. Patient not taking: Reported on 10/01/2017 09/05/17   Rod Can, CNM  Prenatal Vit-Fe Fumarate-FA (PRENATAL VITAMIN PLUS LOW IRON) 27-1 MG TABS Take 1 tablet by mouth daily. 08/05/17   Will Bonnet, MD  promethazine (PHENERGAN) 25 MG tablet Take 0.5 tablets (12.5 mg total) by mouth every 6 (six) hours as needed for nausea or vomiting. Patient not taking: Reported on 09/23/2017 09/05/17   Rod Can, CNM          Social History: She  reports that  has never smoked. she has never used smokeless tobacco. She reports that she uses  drugs. Drug: Marijuana. She reports that she does not drink alcohol.  Family History: family history includes Depression in her mother; Diabetes in her maternal grandmother; Hypertension in her mother; Hypothyroidism in her mother.   Review of Systems: Negative x 10 systems reviewed except as noted in the HPI.      Physical Exam:  Vital Signs: BP 126/77  Pulse 69   Temp 98.3 F (Oral)   Resp (!) 24   LMP 02/14/2017   SpO2 100%  General: on arrival to L&D, sitting upright, moaning occasionally, vomiting in emesis bag, appears tired and in pain HEENT: normocephalic, atraumatic Heart: regular rate & rhythm.  No murmurs Lungs: clear to auscultation bilaterally Abdomen: soft, gravid, generalized tenderness. Difficult to examine, patient unable to lie back.  Pelvic: deferred at this time  Extremities: non-tender, symmetric, no edema bilaterally.  DTRs: +3   Neurologic: answering questions with one word Baseline FHR: difficulty keeping baby on monitor due to patient's upright position: baseline 135 with accelerations to 150s, moderate variability Toco: rare contraction  Results for orders placed or performed during the hospital encounter of 10/01/17 (from the past 24 hour(s))  CBC     Status: Abnormal   Collection Time: 10/01/17  1:58 PM  Result Value Ref Range   WBC 13.2 (H) 3.6 - 11.0 K/uL   RBC 4.22 3.80 - 5.20 MIL/uL   Hemoglobin 12.1 12.0 - 16.0 g/dL   HCT 35.4 35.0 - 47.0 %   MCV 83.9 80.0 - 100.0 fL   MCH 28.6 26.0 - 34.0 pg   MCHC 34.1 32.0 - 36.0 g/dL   RDW 13.7 11.5 - 14.5 %   Platelets 225 150 - 440 K/uL  Comprehensive metabolic panel     Status: Abnormal   Collection Time: 10/01/17  1:58 PM  Result Value Ref Range   Sodium 138 135 - 145 mmol/L   Potassium 3.2 (L) 3.5 - 5.1 mmol/L   Chloride 106 101 - 111 mmol/L   CO2 17 (L) 22 - 32 mmol/L   Glucose, Bld 88 65 - 99 mg/dL   BUN 8 6 - 20 mg/dL   Creatinine, Ser 0.66 0.44 - 1.00 mg/dL   Calcium 8.4 (L) 8.9 - 10.3 mg/dL   Total Protein 7.1 6.5 - 8.1 g/dL   Albumin 3.3 (L) 3.5 - 5.0 g/dL   AST 35 15 - 41 U/L   ALT 13 (L) 14 - 54 U/L   Alkaline Phosphatase 112 38 - 126 U/L   Total Bilirubin 0.7 0.3 - 1.2 mg/dL   GFR calc non Af Amer >60 >60 mL/min   GFR calc Af Amer >60 >60 mL/min   Anion gap 15 5 - 15  Lipase, blood     Status: None   Collection Time: 10/01/17  1:58 PM  Result Value Ref Range   Lipase 41 11 - 51 U/L  Magnesium     Status: Abnormal   Collection Time: 10/01/17  1:58 PM  Result Value Ref Range   Magnesium 1.4 (L) 1.7 - 2.4 mg/dL    Assessment:  Tiffany Guzman is a 22 y.o. G1P0 female at [redacted]w[redacted]d generalized nausea and vomiting and abdominal pain History of 8X9cm dermoid cyst left ovary. R/O torsion Low magnesium Reassuring FHR tracing No evidence of preterm labor   Plan:  1. Admit to Labor & Delivery. Consulted Dr Kenton Kingfisher: will get OB  ultrasound to R/O torsion 2. IV magnesium 2 gm x1 3. OB limited ultrasound (patient in radiology now)  4. Premedicated with 1 mgm Stadol and another 12.5 mgm phenergan IV  5. NPO  Dalia Heading , CNM  Addendum at [phone]  S: Feeling much better. Pain was relieved with Stadol. No further vomiting  O: wet prep was negative for Trich, clue cells, or hyphae Cervix: Closed/long/-2  A/P Awaiting results of ultrasound Dr Kenton Kingfisher will be up to talk with and examine patient.  Dalia Heading, CNM

## 2017-10-01 NOTE — ED Triage Notes (Signed)
Pt presents today with vomiting for 2 days. Pt is 32 weeks. Leaking fluid at this time.

## 2017-10-01 NOTE — H&P (Signed)
H&P: Patient is a 22 y.o. G1P0 who LMP was Patient's last menstrual period was 02/14/2017., presents today at 84 5/7 weeks with pain, nausea, vomiting.  She complains of recent findings of Left ovarion cyst by prior US and MRI during this pregnancy since the start; it has been growing somewhat thoughout.  Last imaging was MRI in January, with LEFT DERMOIDS cyst identified as 8 cm.  She has frequent nighttime nausea and pain, which she relates to her eating habits and timings as much as anything else.  Does not feel it is PTL.  No VB or ROM.  Loose BM today but no diarrhea.    Pt has had symptoms of pain before, severe today.  Concern for torsion.  PMHx: She  has a past medical history of Attention deficit hyperactivity disorder (ADHD), Chronic upper back pain, GERD (gastroesophageal reflux disease), and Migraine with aura. Also,  has a past surgical history that includes Insertion of non vaginal contraceptive device., family history includes Depression in her mother; Diabetes in her maternal grandmother; Hypertension in her mother; Hypothyroidism in her mother.,  reports that  has never smoked. she has never used smokeless tobacco. She reports that she uses drugs. Drug: Marijuana. She reports that she does not drink alcohol.   Current Facility-Administered Medications:  .  acetaminophen (TYLENOL) tablet 650 mg, 650 mg, Oral, Q4H PRN, Gae Dry, MD .  betamethasone acetate-betamethasone sodium phosphate (CELESTONE) injection 12 mg, 12 mg, Intramuscular, Q24H, Anali Cabanilla, Linton Ham, MD .  calcium carbonate (TUMS - dosed in mg elemental calcium) chewable tablet 400 mg of elemental calcium, 2 tablet, Oral, Q4H PRN, Gae Dry, MD .  lactated ringers infusion, , Intravenous, Continuous, Dalia Heading, CNM, Last Rate: 200 mL/hr at 10/01/17 1636 .  ondansetron (ZOFRAN) 4 MG/2ML injection, , , ,  .  ondansetron (ZOFRAN) injection 4 mg, 4 mg, Intravenous, Q6H PRN, Gae Dry, MD .  Derrill Memo  ON 10/02/2017] prenatal multivitamin tablet 1 tablet, 1 tablet, Oral, Q1200, Gae Dry, MD .  zolpidem (AMBIEN) tablet 5 mg, 5 mg, Oral, QHS PRN, Gae Dry, MD  Also, has No Known Allergies.  Review of Systems  Constitutional: Negative for chills, fever and malaise/fatigue.  HENT: Negative for congestion, sinus pain and sore throat.   Eyes: Negative for blurred vision and pain.  Respiratory: Negative for cough and wheezing.   Cardiovascular: Negative for chest pain and leg swelling.  Gastrointestinal: Negative for abdominal pain, constipation, diarrhea, heartburn, nausea and vomiting.  Genitourinary: Negative for dysuria, frequency, hematuria and urgency.  Musculoskeletal: Negative for back pain, joint pain, myalgias and neck pain.  Skin: Negative for itching and rash.  Neurological: Negative for dizziness, tremors and weakness.  Endo/Heme/Allergies: Does not bruise/bleed easily.  Psychiatric/Behavioral: Negative for depression. The patient is not nervous/anxious and does not have insomnia.     Objective: BP 130/84   Pulse 86   Temp 98 F (36.7 C) (Oral)   Resp (!) 24   LMP 02/14/2017   SpO2 100%  Physical Exam  Constitutional: She is oriented to person, place, and time. She appears well-developed and well-nourished. No distress.  Genitourinary: Rectum normal, vagina normal and uterus normal. Pelvic exam was performed with patient supine. There is no rash or lesion on the right labia. There is no rash or lesion on the left labia. Vagina exhibits no lesion. No bleeding in the vagina. Right adnexum does not display mass and does not display tenderness. Left adnexum does not display mass  and does not display tenderness. Cervix does not exhibit motion tenderness, lesion, friability or polyp.   Uterus is mobile and midaxial. Uterus is not enlarged or exhibiting a mass.  Genitourinary Comments: No dilation w cervix  HENT:  Head: Normocephalic and atraumatic. Head is without  laceration.  Right Ear: Hearing normal.  Left Ear: Hearing normal.  Nose: No epistaxis.  No foreign bodies.  Mouth/Throat: Uvula is midline, oropharynx is clear and moist and mucous membranes are normal.  Eyes: Pupils are equal, round, and reactive to light.  Neck: Normal range of motion. Neck supple. No thyromegaly present.  Cardiovascular: Normal rate and regular rhythm. Exam reveals no gallop and no friction rub.  No murmur heard. Pulmonary/Chest: Effort normal and breath sounds normal. No respiratory distress. She has no wheezes. Right breast exhibits no mass, no skin change and no tenderness. Left breast exhibits no mass, no skin change and no tenderness.  Abdominal: Soft. Bowel sounds are normal. She exhibits no distension. There is no tenderness. There is no rebound.  Gravid.  FHTs 140s Min T in LLQ to palpation now. No rebound or guarding. No RLQ T.  Musculoskeletal: Normal range of motion.  Neurological: She is alert and oriented to person, place, and time. No cranial nerve deficit.  Skin: Skin is warm and dry.  Psychiatric: She has a normal mood and affect. Judgment normal.  Vitals reviewed.   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 EXAM: MRI ABDOMEN AND PELVIS WITHOUT CONTRAST TECHNIQUE: Multiplanar multisequence MR imaging of the abdomen and pelvis was  performed. No intravenous contrast was administered. COMPARISON:  None. FINDINGS: COMBINED FINDINGS FOR BOTH MR ABDOMEN AND PELVIS Lower chest: The lung bases are grossly clear. Hepatobiliary: The liver is normal. No lesions or intrahepatic biliary dilatation. The gallbladder is normal. No gallstones or findings for acute cholecystitis. No common bile duct dilatation. Pancreas:  No mass, inflammation or ductal dilatation. Spleen:  Normal size.  No focal lesions. Adrenals/Urinary Tract: The adrenal glands and kidneys are unremarkable. No findings to suggest pyelonephritis or obstructing ureteral calculi. Stomach/Bowel: The stomach, duodenum, small bowel and colon are grossly normal. No obstructive findings or inflammatory changes. No findings to suggest acute appendicitis. The right ovary appears normal. Vascular/Lymphatic: No pathologically enlarged lymph nodes identified. No abdominal aortic aneurysm demonstrated. Reproductive: Gravid uterus with a 28 week fetus. Normal-appearing amniotic fluid volume and anterior placenta. 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 This measures a maximum of 8.9 x 8.1 cm. Musculoskeletal: No significant findings. IMPRESSION: 1. No MR findings to suggest acute appendicitis, cholecystitis or pyelonephritis/hydronephrosis. 2. Gravid uterus with normal-appearing amniotic fluid volume and placenta. 3. 8.9 x 8.1 cm pelvic dermoid adjacent to the left ovary. Electronically Signed  By: Marijo Sanes M.D.   On: 09/04/2017 21:50  ectronically Signed   By: Kristine Garbe M.D.   On: 10/01/2017 18:32   US Abdominal Pelvic Art/vent Flow Doppler  Result Date: 10/01/2017 CLINICAL DATA:  22 y/o F; abdominal pain. Dermoid cyst of left ovary. EXAM: LIMITED OBSTETRIC ULTRASOUND AN DOPPLER COMPARISON:  09/04/2017 MRI of the abdomen and pelvis. FINDINGS: Number of Fetuses: 1 Heart Rate:  141 bpm Movement: Yes Presentation: Cephalic Placental  Location: Anterior Previa: No Amniotic Fluid (Subjective):  Within normal limits. BPD:  8.14cm 32w 5d MATERNAL FINDINGS: Cervix:  Appears closed.  3.5 cm in length. Uterus/Adnexae: 1.8 cm echogenic focus in the right ovary, probably corpus luteum. Complex mass of the left adnexa with both large solid and cystic regions measuring 12.6 x 6.6 x 8.1 cm in total. Solid components are largely hyperechoic and the the cystic region measures up to 7.6 cm. Arterial and venous Doppler signal is detected at the periphery of the cystic component. Pulsed Doppler evaluation of both ovaries demonstrates normal low-resistance arterial and venous waveforms. Other findings No free fluid. IMPRESSION: 1. Single live intrauterine pregnancy with estimated gestational age [redacted] weeks 5 days. 2. Left adnexal complex solid and cystic mass measuring up to 12.6 cm characterized as dermoid on prior MRI of the abdomen and pelvis. Arterial and venous doppler is detected at the periphery of the cystic component. No definite discrete left ovary identified. 3. Unremarkable appearance of right ovary. This exam is performed on an emergent basis and does not comprehensively evaluate fetal size, dating, or anatomy; follow-up complete OB US should be considered if further fetal assessment is warranted. Electronically Signed   By: Kristine Garbe M.D.   On: 10/01/2017 18:32   A NST procedure was performed with FHR monitoring and a normal baseline established, appropriate time of 20-40 minutes of evaluation, and accels >2 seen w 15x15 characteristics.  Results show a REACTIVE NST.    PLAN: 1. Counseled as to the pros and cons of surgery for enlargening dermoid left ovarian cyst (now 12 cm by [place]).  Risks of surgery include standard risks of any surgery but also PTL or fetal compromise during anesthesia or from positioning of surgery.  Also, risk to uterus as would have to manipulate to get to ovary.  There would also be risks in recovery and  subsequent labor, increased risks for CS. 2. There is also risk to not removing dermoid cyst and continuing with pregnancy.  Risk of torsion, further pain, PTL from pain or crowding, and worsening nausea/vomiting with dehydration and malnutrition are all possibilties. 3. As pain has improved with rest, Stadol dose, she would prefer a non-surgical approach.  As there is no evidence for torsion or emergency pain, we will observe overnight and see if pain resumes at the same intensity as Stadol wears off.  BMZ for FLM as she may have to deliver early. 4. Allow diet as no immediate plan for surgery. 5. Fetal monitoring:  NST reactive; will monitor FHTs q 8 hours and further monitoring based on change in symptoms.   Barnett Applebaum, MD, [facility], [facility] 10/01/2017  7:49 PM

## 2017-10-02 ENCOUNTER — Observation Stay: Payer: Medicaid Other

## 2017-10-02 DIAGNOSIS — Z3490 Encounter for supervision of normal pregnancy, unspecified, unspecified trimester: Secondary | ICD-10-CM

## 2017-10-02 LAB — URINALYSIS, COMPLETE (UACMP) WITH MICROSCOPIC
Bilirubin Urine: NEGATIVE
Glucose, UA: NEGATIVE mg/dL
Hgb urine dipstick: NEGATIVE
Ketones, ur: 80 mg/dL — AB
Leukocytes, UA: NEGATIVE
Nitrite: NEGATIVE
Protein, ur: 30 mg/dL — AB
SPECIFIC GRAVITY, URINE: 1.021 (ref 1.005–1.030)
pH: 6 (ref 5.0–8.0)

## 2017-10-02 LAB — COMPREHENSIVE METABOLIC PANEL
ALT: 13 U/L — ABNORMAL LOW (ref 14–54)
AST: 21 U/L (ref 15–41)
Albumin: 3.1 g/dL — ABNORMAL LOW (ref 3.5–5.0)
Alkaline Phosphatase: 101 U/L (ref 38–126)
Anion gap: 10 (ref 5–15)
BILIRUBIN TOTAL: 0.6 mg/dL (ref 0.3–1.2)
BUN: <5 mg/dL — ABNORMAL LOW (ref 6–20)
CALCIUM: 8.4 mg/dL — AB (ref 8.9–10.3)
CHLORIDE: 110 mmol/L (ref 101–111)
CO2: 19 mmol/L — ABNORMAL LOW (ref 22–32)
CREATININE: 0.52 mg/dL (ref 0.44–1.00)
Glucose, Bld: 101 mg/dL — ABNORMAL HIGH (ref 65–99)
Potassium: 3 mmol/L — ABNORMAL LOW (ref 3.5–5.1)
SODIUM: 139 mmol/L (ref 135–145)
Total Protein: 6.7 g/dL (ref 6.5–8.1)

## 2017-10-02 LAB — AMYLASE: AMYLASE: 36 U/L (ref 28–100)

## 2017-10-02 LAB — LIPASE, BLOOD: Lipase: 33 U/L (ref 11–51)

## 2017-10-02 MED ORDER — HYDROMORPHONE HCL 1 MG/ML IJ SOLN
0.5000 mg | INTRAMUSCULAR | Status: AC
  Administered 2017-10-02: 0.5 mg via INTRAVENOUS

## 2017-10-02 MED ORDER — HYDROMORPHONE HCL 1 MG/ML IJ SOLN
1.0000 mg | INTRAMUSCULAR | Status: AC
  Administered 2017-10-02: 1 mg via INTRAVENOUS
  Filled 2017-10-02: qty 1

## 2017-10-02 MED ORDER — ONDANSETRON HCL 4 MG/2ML IJ SOLN
4.0000 mg | Freq: Four times a day (QID) | INTRAMUSCULAR | Status: DC | PRN
Start: 2017-10-02 — End: 2017-10-03
  Administered 2017-10-02 – 2017-10-03 (×4): 4 mg via INTRAVENOUS
  Filled 2017-10-02 (×4): qty 2

## 2017-10-02 MED ORDER — MORPHINE SULFATE (PF) 2 MG/ML IV SOLN
2.0000 mg | INTRAVENOUS | Status: DC | PRN
Start: 2017-10-02 — End: 2017-10-02
  Administered 2017-10-02: 2 mg via INTRAVENOUS
  Filled 2017-10-02: qty 1

## 2017-10-02 MED ORDER — BUTORPHANOL TARTRATE 1 MG/ML IJ SOLN
1.0000 mg | Freq: Once | INTRAMUSCULAR | Status: AC
  Administered 2017-10-02: 1 mg via INTRAVENOUS
  Filled 2017-10-02: qty 1

## 2017-10-02 MED ORDER — FAMOTIDINE 20 MG PO TABS
20.0000 mg | ORAL_TABLET | Freq: Two times a day (BID) | ORAL | Status: DC | PRN
  Administered 2017-10-02 – 2017-10-03 (×2): 20 mg via ORAL
  Filled 2017-10-02 (×4): qty 1

## 2017-10-02 MED ORDER — PROMETHAZINE HCL 25 MG/ML IJ SOLN
25.0000 mg | Freq: Once | INTRAMUSCULAR | Status: AC
  Administered 2017-10-02: 25 mg via INTRAVENOUS
  Filled 2017-10-02: qty 1

## 2017-10-02 MED ORDER — SODIUM CHLORIDE FLUSH 0.9 % IV SOLN
INTRAVENOUS | Status: AC
  Filled 2017-10-02: qty 10

## 2017-10-02 MED ORDER — HYDROMORPHONE HCL 1 MG/ML IJ SOLN
INTRAMUSCULAR | Status: AC
  Administered 2017-10-02: 0.5 mg via INTRAVENOUS
  Filled 2017-10-02: qty 1

## 2017-10-02 MED ORDER — BETAMETHASONE SOD PHOS & ACET 6 (3-3) MG/ML IJ SUSP
INTRAMUSCULAR | Status: AC
  Administered 2017-10-02: 12 mg via INTRAMUSCULAR
  Filled 2017-10-02: qty 1

## 2017-10-02 MED ORDER — DEXTROSE IN LACTATED RINGERS 5 % IV SOLN
INTRAVENOUS | Status: DC
  Administered 2017-10-02 – 2017-10-03 (×2): via INTRAVENOUS
  Filled 2017-10-02 (×7): qty 1000

## 2017-10-02 NOTE — Progress Notes (Signed)
Patient down to ultrasound.

## 2017-10-02 NOTE — Progress Notes (Signed)
Patient stated she peed all in the bed; in the the midst of throwing up. Asked patient if she thought it was pee or did she think her water broke. She wasn't sure; probably pee. The fluid on the bed was clear without an odor. The bed linen was changed and panties and a pad were put on the patient to see if there was any further discharge/ leaking of fluid. Spoke with Dr. Glennon Mac about this.

## 2017-10-02 NOTE — Progress Notes (Signed)
Patient woke up from nap stating she just did not feel right. Denies contractions but states her entire body aches and rated her abdomen as a 9/10 pain (discomfort and sore). Vitals taken; temp: 98.3, Sp02: 100%, pulse: 97, BP: 128/85, RR: 18. Patient stated she was starting to feel like she did yesterday. She vomited a small amount. (10 mLs). Delsa Bern on the unit, spoke with her about what was going on. She was going to talk with Dr. Glennon Mac.

## 2017-10-02 NOTE — Progress Notes (Signed)
Consult note 22 yo G1 p0 at 20 w 6d WF presented to the hospital due to abdominal pain and vomiting yesterday .  Pt had difficulty discussing her sxs due to her movements to attemtp to find a comfortable position and episodes of retching - she is holding an emesis bag with about 200 cc of bilious fluid  Pt says she hadn't eaten for a day or two prior to coming in - her last BM was yesterday am and it was runny.  Pt has a h/o dermoid 9-12cm - she underwent MRI earlier this pregnancy - reported to be in the cul de sac normal vascular flow on scan during this admission  WBC 13 K on admission  Pt received BTM x1  last night - she has received antiemetics and stadol with little relief .  No h/o narcotic use  No one else at home is sick   PMH neg - no h/o gallstones , kidney stones , UTI no abdominal surgery -scar at her umbilicus from navel ring   No Known Allergies  Vitals:   10/02/17 1015 10/02/17 1212  BP: 128/85 106/66  Pulse: 97 98  Resp: 18 20  Temp: 98.3 F (36.8 C) 98.5 F (36.9 C)  SpO2: 100% 99%   Agitated appearing young woman rocking holding an emesis bag - throwing up intermittently  No one in the room with her Tolerated pounding on her heels and straight leg raise  Abdomen -  Gravid - soft - no rebound but c/o diffuse tenderness   A/P  IUP at 32 6/7  -Acutely ill with abdominal complaints  -Hypokalemia - replace potassium  -Given presence of large dermoid- torsion ( u/s is not sensitive or specific for the diagnosis of torsion most common finding is a mass which she has) leads in the differential diagnosis  Appendicitis can have variable presentation in pregnancy .   Prir to taking to OR rule out other causes with non-surgical treatments - ie kidney stones, gallstones , pancreatitis  Doubt flu or simple gastroenteritis given  prolonged course and lack of response to antiemetics .  Doubt chorio given normal FHR.  Recommend cervical exam to r/o labor .  If no other  treatable causes then I recommend ex lap and oophorectomy-unless cystectomy easily accomplished - (u/s suggests she may have a small dermoid on the right ovaryas well at 21yo fertility preservation is a concern ) Consent pt for possible cesarean - the left dermoid is deep in t he cul de sac and may require uterine manipulation - abruption is a possibility . Having an anesthesiologist comfortable with OB cases may be useful as well as having NICU notified and Gen surg available if unexpected pathology encountered - for that reason would explore via midline vertical.   I suggested that the patient have a family member come be with her to assist with decision making .  If peds or anesthesia prefer or if Gen Surg not available -I can arrange transfer to [facility] if helpful.

## 2017-10-02 NOTE — Progress Notes (Signed)
Patient ID: Eman Rynders, female   DOB: 10/27/1995, 22 y.o.   MRN: [id number]  Daily Antepartum Note  Admission Date: 10/01/2017 Current Date: 10/02/2017 7:22 PM  Hessie Lon Estill is a 22 y.o. G1P0 @ [redacted]w[redacted]d, HD#2, admitted for abdominal pain, concern for ovarian torsion.  Pregnancy complicated by: Dermoid cyst, which has caused her abdominal pain Patient Active Problem List   Diagnosis Date Noted  . 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 Dermoid cyst of ovary affecting pregnancy, antepartum 09/04/2017  . Abdominal pain during pregnancy, third trimester 09/04/2017  . Left ovarian cyst 05/20/2017  . Supervision of normal first pregnancy, antepartum 04/14/2017  . Breast tenderness 06/24/2016  . Urinary incontinence 06/24/2016  . Anxiety 06/24/2016    Overnight/24hr events:  No acute events  Subjective:  Reports severe pain diffusely on her abdomen.  She can not localize the pain. She has continued to retch and vomit all morning since her early-morning nap. Her pain is currently 8-9/10.  She has had some morphine without relief of symptoms.  Zofran and phenergan have not helped either.  She notes +FM, no LOF, no vaginal bleeding, and no contractions.     Objective:   Vitals:   10/02/17 1441 10/02/17 1552  BP: 132/82 132/87  Pulse: 89 85  Resp: 20 18  Temp: 98.9 F (37.2 C) 98.7 F (37.1 C)  SpO2: 98% 98%   Temp:  [98.1 F (36.7 C)-98.9 F (37.2 C)] 98.7 F (37.1 C) (03/14 1552) Pulse Rate:  [65-98] 85 (03/14 1552) Resp:  [16-20] 18 (03/14 1552) BP: (106-136)/(64-87) 132/87 (03/14 1552) SpO2:  [95 %-100 %] 98 % (03/14 1552) Temp (24hrs), Avg:98.6 F (37 C), Min:98.1 F (36.7 C), Max:98.9 F (37.2 C)   Intake/Output Summary (Last 24 hours) at 10/02/2017 1922 Last data filed at 10/02/2017 1410 Gross per 24 hour  Intake 3039 ml  Output 150 ml  Net 2889 ml     Current Vital Signs 24h Vital Sign Ranges  T 98.7 F (37.1 C) Temp  Avg: 98.6 F (37 C)  Min:  98.1 F (36.7 C)  Max: 98.9 F (37.2 C)  BP 132/87 BP  Min: 106/66  Max: 136/79  HR 85 Pulse  Avg: 87.2  Min: 65  Max: 98  RR 18 Resp  Avg: 18.5  Min: 16  Max: 20  SaO2 98 % Room Air SpO2  Avg: 98.4 %  Min: 95 %  Max: 100 %       24 Hour I/O Current Shift I/O  Time Ins Outs No intake/output data recorded. No intake/output data recorded.   Patient Vitals for the past 24 hrs:  BP Temp Temp src Pulse Resp SpO2  10/02/17 1552 132/87 98.7 F (37.1 C) - 85 18 98 %  10/02/17 1441 132/82 98.9 F (37.2 C) Oral 89 20 98 %  10/02/17 1212 106/66 98.5 F (36.9 C) - 98 20 99 %  10/02/17 1015 128/85 98.3 F (36.8 C) Oral 97 18 100 %  10/02/17 0826 118/64 98.1 F (36.7 C) Oral 65 18 100 %  10/02/17 0341 136/79 98.3 F (36.8 C) Oral 88 - 99 %  10/02/17 0123 135/76 98.9 F (37.2 C) Oral 86 20 95 %  10/02/17 0048 126/85 98.8 F (37.1 C) Oral 92 16 -  10/01/17 2144 129/68 98.6 F (37 C) - 86 18 -  10/01/17 1923 130/84 - - 86 - -    Physical exam: .Physical Exam  Constitutional: She is oriented to  person, place, and time. She appears well-developed and well-nourished. She appears distressed.  She is sitting up and rocking in the bed. She has an emesis bag with about 200 ml of bilious emesis.   Eyes: No scleral icterus.  Cardiovascular: Normal rate and regular rhythm. Exam reveals no gallop and no friction rub.  No murmur heard. Pulmonary/Chest: Effort normal. No respiratory distress. She has no wheezes. She has no rales.  Abdominal: Soft. Bowel sounds are normal. She exhibits no distension and no mass. There is tenderness. There is no rebound and no guarding.  She appears quite uncomfortable with any postion change. Every location palpated is painful on her abdomen.   Genitourinary:  Genitourinary Comments: Cervix: close/thick/high  Musculoskeletal: Normal range of motion. She exhibits no edema or tenderness.  Neurological: She is alert and oriented to person, place, and time. No cranial  nerve deficit.  Psychiatric: She has a normal mood and affect. Her behavior is normal. Judgment normal.      Medications: Current Facility-Administered Medications  Medication Dose Route Frequency Provider Last Rate Last Dose  . acetaminophen (TYLENOL) tablet 650 mg  650 mg Oral Q4H PRN Gae Dry, MD   650 mg at 10/02/17 0715  . betamethasone acetate-betamethasone sodium phosphate (CELESTONE) injection 12 mg  12 mg Intramuscular Q24H Gae Dry, MD   12 mg at 10/01/17 2144  . dextrose 5% lactated ringers 1,000 mL with potassium chloride 40 mEq infusion   Intravenous Continuous Will Bonnet, MD      . famotidine (PEPCID) tablet 20 mg  20 mg Oral BID PRN Dalia Heading, CNM   20 mg at 10/02/17 0125  . ondansetron (ZOFRAN) injection 4 mg  4 mg Intravenous Q6H PRN Rexene Agent, CNM   4 mg at 10/02/17 1719  . prenatal multivitamin tablet 1 tablet  1 tablet Oral Q1200 Gae Dry, MD      . promethazine (PHENERGAN) injection 12.5 mg  12.5 mg Intravenous Q6H PRN Dalia Heading, CNM   12.5 mg at 10/02/17 7628  . zolpidem (AMBIEN) tablet 5 mg  5 mg Oral QHS PRN Gae Dry, MD        Labs:  Recent Labs  Lab 10/01/17 1358  WBC 13.2*  HGB 12.1  HCT 35.4  PLT 225    Recent Labs  Lab 10/01/17 1358 10/02/17 1447  NA 138 139  K 3.2* 3.0*  CL 106 110  CO2 17* 19*  BUN 8 <5*  CREATININE 0.66 0.52  CALCIUM 8.4* 8.4*  PROT 7.1 6.7  BILITOT 0.7 0.6  ALKPHOS 112 101  ALT 13* 13*  AST 35 21  GLUCOSE 88 101*     Radiology:  US Abdomen Complete  Result Date: 10/02/2017 CLINICAL DATA:  Abdominal pain and pregnancy EXAM: ABDOMEN ULTRASOUND COMPLETE COMPARISON:  CT abdomen pelvis 03/25/2015 FINDINGS: Gallbladder: No gallstones or wall thickening visualized. No sonographic Murphy sign noted by sonographer. Common bile duct: Diameter: 3.0 mm Liver: No focal lesion identified. Within normal limits in parenchymal echogenicity. Portal vein is patent on  color Doppler imaging with normal direction of blood flow towards the liver. IVC: No abnormality visualized. Pancreas: Visualized portion unremarkable. Spleen: Size and appearance within normal limits. Right Kidney: Length: 12.3 cm. Echogenicity within normal limits. No mass or hydronephrosis visualized. Left Kidney: Length: 11.1 cm. Echogenicity within normal limits. No mass or hydronephrosis visualized. Abdominal aorta: No aneurysm visualized. Other findings: Gravid uterus noted. Fetus evaluated yesterday by ultrasound IMPRESSION: Negative abdominal ultrasound.  Electronically Signed   By: Franchot Gallo M.D.   On: 10/02/2017 17:09   Labs: Lab Results  Component Value Date   NA 139 10/02/2017   K 3.0 (L) 10/02/2017   CL 110 10/02/2017   GLUCOSE 101 (H) 10/02/2017   CREATININE 0.52 10/02/2017   AST 21 10/02/2017   ALT 13 (L) 10/02/2017    Lab Results  Component Value Date   AMYLASE 36 10/02/2017   LIPASE 33 10/02/2017    Assessment & Plan:  22 y.o. G1P0 female with acute-onset abdominal pain in significant current pain.  Concern for acute abdomen with leading diagnosis of differential being left ovarian torsion, given her dermoid that has been present.  Other considerations are less likely given normal labs, such as LFTs, amylase, lipase, and normal abdominal ultrasound.  Appendicitis is still on the differential. Could consider MRI for appendicitis.   Interval update: The patient has now received dilaudid and reports no pain. However, her pain is significantly different than normal and she has been unable to tolerate PO at all since arrival yesterday.  It was strongly recommended that she undergo exploratory laparotomy with removal of her left ovary and fallopian tube, along with the dermoid.  We discussed the risks of the surgery, including risk of need to deliver the infant, along with the standard surgical risks.  She refuses surgery at this time. She was counseled that her situation could  become more serious by holding off on surgery with resultant worse outcome for her and the baby, including death, need for ICU treatment, etc.  She voiced understanding of these risks and would like to see how things go. She is hoping to hold off and make it a few weeks longer.  I discussed that she does have better pain control and that her current state of mind could be based on her current improvement in symptoms. She must be tolerating PO and we discussed that it is not acceptable to keep her NPO and give her strong IV pain medication for an extended period of time.   She is to receive her second dose of betamethasone tonight at 7pm.   Will continue to watch her tonight on L&D for signs of worsening symptoms as the dilaudid wears off.   She understands that she may still need to go to the OR tonight emergently, if her condition worsens.  She voiced understanding and all her questions were answered.    Appreciate input by MFM.   Prentice Docker, MD, [facility], [facility] 10/02/2017 7:35 PM

## 2017-10-02 NOTE — Progress Notes (Signed)
Patient vomited a couple more times totaling 100 mL's. Zofran given

## 2017-10-03 ENCOUNTER — Encounter
Admission: EM | Disposition: A | Discharge status: Home / Self Care | Payer: Self-pay | Source: Home / Self Care | Attending: Obstetrics and Gynecology

## 2017-10-03 ENCOUNTER — Inpatient Hospital Stay: Payer: Medicaid Other | Admitting: Registered Nurse

## 2017-10-03 ENCOUNTER — Other Ambulatory Visit: Payer: Self-pay

## 2017-10-03 DIAGNOSIS — D271 Benign neoplasm of left ovary: Secondary | ICD-10-CM

## 2017-10-03 DIAGNOSIS — E876 Hypokalemia: Secondary | ICD-10-CM | POA: Diagnosis present

## 2017-10-03 DIAGNOSIS — K219 Gastro-esophageal reflux disease without esophagitis: Secondary | ICD-10-CM | POA: Diagnosis present

## 2017-10-03 DIAGNOSIS — D509 Iron deficiency anemia, unspecified: Secondary | ICD-10-CM | POA: Diagnosis present

## 2017-10-03 DIAGNOSIS — Z3A32 32 weeks gestation of pregnancy: Secondary | ICD-10-CM

## 2017-10-03 DIAGNOSIS — D369 Benign neoplasm, unspecified site: Secondary | ICD-10-CM | POA: Diagnosis present

## 2017-10-03 DIAGNOSIS — O99013 Anemia complicating pregnancy, third trimester: Secondary | ICD-10-CM | POA: Diagnosis present

## 2017-10-03 DIAGNOSIS — O3483 Maternal care for other abnormalities of pelvic organs, third trimester: Secondary | ICD-10-CM

## 2017-10-03 DIAGNOSIS — Z3A33 33 weeks gestation of pregnancy: Secondary | ICD-10-CM

## 2017-10-03 DIAGNOSIS — O99613 Diseases of the digestive system complicating pregnancy, third trimester: Secondary | ICD-10-CM | POA: Diagnosis present

## 2017-10-03 DIAGNOSIS — D279 Benign neoplasm of unspecified ovary: Secondary | ICD-10-CM | POA: Diagnosis not present

## 2017-10-03 DIAGNOSIS — O133 Gestational [pregnancy-induced] hypertension without significant proteinuria, third trimester: Secondary | ICD-10-CM

## 2017-10-03 HISTORY — PX: OOPHORECTOMY: SHX6387

## 2017-10-03 HISTORY — PX: OVARIAN CYST REMOVAL: SHX89

## 2017-10-03 LAB — BASIC METABOLIC PANEL
ANION GAP: 12 (ref 5–15)
BUN: 6 mg/dL (ref 6–20)
CALCIUM: 8.6 mg/dL — AB (ref 8.9–10.3)
CO2: 20 mmol/L — AB (ref 22–32)
CREATININE: 0.6 mg/dL (ref 0.44–1.00)
Chloride: 106 mmol/L (ref 101–111)
GFR calc non Af Amer: >60 mL/min (ref >60)
Glucose, Bld: 123 mg/dL — ABNORMAL HIGH (ref 65–99)
Potassium: 3.3 mmol/L — ABNORMAL LOW (ref 3.5–5.1)
SODIUM: 138 mmol/L (ref 135–145)

## 2017-10-03 LAB — TYPE AND SCREEN
ABO/RH(D): B POS
Antibody Screen: NEGATIVE

## 2017-10-03 LAB — CBC
HCT: 32.1 % — ABNORMAL LOW (ref 35.0–47.0)
Hemoglobin: 10.8 g/dL — ABNORMAL LOW (ref 12.0–16.0)
MCH: 28.6 pg (ref 26.0–34.0)
MCHC: 33.7 g/dL (ref 32.0–36.0)
MCV: 85 fL (ref 80.0–100.0)
PLATELETS: 238 10*3/uL (ref 150–440)
RBC: 3.78 MIL/uL — AB (ref 3.80–5.20)
RDW: 14.3 % (ref 11.5–14.5)
WBC: 16.9 10*3/uL — ABNORMAL HIGH (ref 3.6–11.0)

## 2017-10-03 LAB — ABO/RH: ABO/RH(D): B POS

## 2017-10-03 SURGERY — OOPHORECTOMY
Anesthesia: Spinal | Site: Abdomen | Wound class: Clean Contaminated

## 2017-10-03 MED ORDER — POTASSIUM CHLORIDE IN NACL 40-0.9 MEQ/L-% IV SOLN
INTRAVENOUS | Status: DC
  Administered 2017-10-03 – 2017-10-04 (×2): 125 mL/h via INTRAVENOUS
  Filled 2017-10-03 (×5): qty 1000

## 2017-10-03 MED ORDER — SIMETHICONE 80 MG PO CHEW
80.0000 mg | CHEWABLE_TABLET | ORAL | Status: DC | PRN
  Administered 2017-10-03 – 2017-10-06 (×3): 80 mg via ORAL
  Filled 2017-10-03 (×3): qty 1

## 2017-10-03 MED ORDER — DIPHENHYDRAMINE HCL 25 MG PO CAPS
25.0000 mg | ORAL_CAPSULE | Freq: Four times a day (QID) | ORAL | Status: DC | PRN
  Administered 2017-10-03: 25 mg via ORAL
  Filled 2017-10-03: qty 1

## 2017-10-03 MED ORDER — ZOLPIDEM TARTRATE 5 MG PO TABS: 5.0000 mg | ORAL_TABLET | Freq: Every evening | ORAL | Status: DC | PRN

## 2017-10-03 MED ORDER — ACETAMINOPHEN 325 MG PO TABS
650.0000 mg | ORAL_TABLET | ORAL | Status: DC | PRN
  Administered 2017-10-03 – 2017-10-07 (×18): 650 mg via ORAL
  Filled 2017-10-03 (×18): qty 2

## 2017-10-03 MED ORDER — SODIUM CHLORIDE 0.9 % IV SOLN
2.0000 g | INTRAVENOUS | Status: DC
  Filled 2017-10-03: qty 2

## 2017-10-03 MED ORDER — MENTHOL 3 MG MT LOZG
1.0000 | LOZENGE | OROMUCOSAL | Status: DC | PRN
  Filled 2017-10-03: qty 9

## 2017-10-03 MED ORDER — HYDROMORPHONE HCL 1 MG/ML IJ SOLN
INTRAMUSCULAR | Status: AC
  Filled 2017-10-03: qty 1

## 2017-10-03 MED ORDER — PRENATAL MULTIVITAMIN CH
1.0000 | ORAL_TABLET | Freq: Every day | ORAL | Status: DC
  Administered 2017-10-04 – 2017-10-07 (×4): 1 via ORAL
  Filled 2017-10-03 (×4): qty 1

## 2017-10-03 MED ORDER — TERBUTALINE SULFATE 1 MG/ML IJ SOLN
INTRAMUSCULAR | Status: AC
  Administered 2017-10-03: 0.25 mg via SUBCUTANEOUS
  Filled 2017-10-03: qty 1

## 2017-10-03 MED ORDER — FENTANYL CITRATE (PF) 100 MCG/2ML IJ SOLN
INTRAMUSCULAR | Status: AC
  Filled 2017-10-03: qty 2

## 2017-10-03 MED ORDER — CEFAZOLIN SODIUM-DEXTROSE 2-4 GM/100ML-% IV SOLN
2.0000 g | Freq: Once | INTRAVENOUS | Status: AC
  Administered 2017-10-03: 2 g via INTRAVENOUS
  Filled 2017-10-03: qty 100

## 2017-10-03 MED ORDER — MORPHINE SULFATE (PF) 0.5 MG/ML IJ SOLN
INTRAMUSCULAR | Status: DC | PRN
  Administered 2017-10-03: .1 mg via INTRATHECAL

## 2017-10-03 MED ORDER — ALUM & MAG HYDROXIDE-SIMETH 200-200-20 MG/5ML PO SUSP
ORAL | Status: AC
  Administered 2017-10-03: 30 mL via ORAL
  Filled 2017-10-03: qty 30

## 2017-10-03 MED ORDER — SODIUM CHLORIDE FLUSH 0.9 % IV SOLN
INTRAVENOUS | Status: AC
  Filled 2017-10-03: qty 10

## 2017-10-03 MED ORDER — ACETAMINOPHEN 10 MG/ML IV SOLN
INTRAVENOUS | Status: AC
  Filled 2017-10-03: qty 100

## 2017-10-03 MED ORDER — FENTANYL CITRATE (PF) 100 MCG/2ML IJ SOLN
INTRAMUSCULAR | Status: DC | PRN
  Administered 2017-10-03: 15 ug via INTRATHECAL

## 2017-10-03 MED ORDER — SODIUM CHLORIDE 0.9 % IV SOLN
0.5000 mg/h | Freq: Once | INTRAVENOUS | Status: DC
  Administered 2017-10-03: 0.5 mg/h via INTRAVENOUS

## 2017-10-03 MED ORDER — POTASSIUM CHLORIDE CRYS ER 20 MEQ PO TBCR
40.0000 meq | EXTENDED_RELEASE_TABLET | Freq: Two times a day (BID) | ORAL | Status: DC
  Administered 2017-10-03: 40 meq via ORAL
  Filled 2017-10-03 (×3): qty 2

## 2017-10-03 MED ORDER — OXYCODONE HCL 5 MG PO TABS
5.0000 mg | ORAL_TABLET | ORAL | Status: DC | PRN
  Administered 2017-10-03: 5 mg via ORAL
  Filled 2017-10-03: qty 1

## 2017-10-03 MED ORDER — LACTATED RINGERS IV SOLN
INTRAVENOUS | Status: DC | PRN
  Administered 2017-10-03: 12:00:00 via INTRAVENOUS

## 2017-10-03 MED ORDER — MORPHINE SULFATE (PF) 0.5 MG/ML IJ SOLN
INTRAMUSCULAR | Status: AC
Start: 2017-10-03 — End: ?
  Filled 2017-10-03: qty 10

## 2017-10-03 MED ORDER — SOD CITRATE-CITRIC ACID 500-334 MG/5ML PO SOLN
ORAL | Status: AC
  Administered 2017-10-03: 12:00:00
  Filled 2017-10-03: qty 15

## 2017-10-03 MED ORDER — HYDROMORPHONE HCL 1 MG/ML IJ SOLN
0.5000 mg | Freq: Once | INTRAMUSCULAR | Status: AC
  Administered 2017-10-03: 0.5 mg via INTRAVENOUS

## 2017-10-03 MED ORDER — COCONUT OIL OIL: 1.0000 "application " | TOPICAL_OIL | Status: DC | PRN

## 2017-10-03 MED ORDER — OXYCODONE HCL 5 MG PO TABS
10.0000 mg | ORAL_TABLET | ORAL | Status: DC | PRN
  Administered 2017-10-03 – 2017-10-07 (×21): 10 mg via ORAL
  Filled 2017-10-03 (×21): qty 2

## 2017-10-03 MED ORDER — ACETAMINOPHEN 10 MG/ML IV SOLN
INTRAVENOUS | Status: DC | PRN
  Administered 2017-10-03: 1000 mg via INTRAVENOUS

## 2017-10-03 MED ORDER — SIMETHICONE 80 MG PO CHEW
80.0000 mg | CHEWABLE_TABLET | ORAL | Status: DC
  Administered 2017-10-04 – 2017-10-06 (×3): 80 mg via ORAL
  Filled 2017-10-03 (×3): qty 1

## 2017-10-03 MED ORDER — SIMETHICONE 80 MG PO CHEW: 80.0000 mg | CHEWABLE_TABLET | Freq: Three times a day (TID) | ORAL | Status: DC

## 2017-10-03 MED ORDER — TERBUTALINE SULFATE 1 MG/ML IJ SOLN
0.2500 mg | Freq: Once | INTRAMUSCULAR | Status: AC
  Administered 2017-10-03: 0.25 mg via SUBCUTANEOUS

## 2017-10-03 MED ORDER — ALUM & MAG HYDROXIDE-SIMETH 200-200-20 MG/5ML PO SUSP
30.0000 mL | ORAL | Status: DC | PRN
  Administered 2017-10-03 – 2017-10-04 (×2): 30 mL via ORAL
  Filled 2017-10-03: qty 30

## 2017-10-03 MED ORDER — SUCCINYLCHOLINE CHLORIDE 20 MG/ML IJ SOLN
INTRAMUSCULAR | Status: AC
  Filled 2017-10-03: qty 1

## 2017-10-03 MED ORDER — BISACODYL 10 MG RE SUPP
10.0000 mg | Freq: Every day | RECTAL | Status: DC | PRN
  Filled 2017-10-03: qty 1

## 2017-10-03 MED ORDER — OXYTOCIN 40 UNITS IN LACTATED RINGERS INFUSION - SIMPLE MED
INTRAVENOUS | Status: AC
  Filled 2017-10-03: qty 1000

## 2017-10-03 MED ORDER — SODIUM CHLORIDE 0.9 % IV SOLN
INTRAVENOUS | Status: DC | PRN
  Administered 2017-10-03: 50 ug/min via INTRAVENOUS

## 2017-10-03 MED ORDER — HYDROMORPHONE HCL 1 MG/ML IJ SOLN
1.0000 mg | INTRAMUSCULAR | Status: AC
  Administered 2017-10-03: 1 mg via INTRAVENOUS
  Filled 2017-10-03: qty 1

## 2017-10-03 MED ORDER — BUPIVACAINE IN DEXTROSE 0.75-8.25 % IT SOLN
INTRATHECAL | Status: DC | PRN
  Administered 2017-10-03: 1.6 mL via INTRATHECAL

## 2017-10-03 SURGICAL SUPPLY — 28 items
CANISTER SUCT 3000ML PPV (MISCELLANEOUS) ×4 IMPLANT
CHLORAPREP W/TINT 26ML (MISCELLANEOUS) ×8 IMPLANT
COVER PROBE FLX POLY STRL (MISCELLANEOUS) ×4 IMPLANT
DERMABOND ADVANCED (GAUZE/BANDAGES/DRESSINGS) ×2
DERMABOND ADVANCED .7 DNX12 (GAUZE/BANDAGES/DRESSINGS) ×2 IMPLANT
DRSG OPSITE POSTOP 4X10 (GAUZE/BANDAGES/DRESSINGS) ×4 IMPLANT
ELECT CAUTERY BLADE 6.4 (BLADE) ×4 IMPLANT
ELECT REM PT RETURN 9FT ADLT (ELECTROSURGICAL) ×4
ELECTRODE REM PT RTRN 9FT ADLT (ELECTROSURGICAL) ×2 IMPLANT
GLOVE BIOGEL PI IND STRL 6.5 (GLOVE) ×2 IMPLANT
GLOVE BIOGEL PI INDICATOR 6.5 (GLOVE) ×2
GOWN STRL REUS W/ TWL LRG LVL3 (GOWN DISPOSABLE) ×2 IMPLANT
GOWN STRL REUS W/ TWL XL LVL3 (GOWN DISPOSABLE) ×4 IMPLANT
GOWN STRL REUS W/TWL LRG LVL3 (GOWN DISPOSABLE) ×2
GOWN STRL REUS W/TWL XL LVL3 (GOWN DISPOSABLE) ×4
LIGASURE IMPACT 36 18CM CVD LR (INSTRUMENTS) ×4 IMPLANT
NS IRRIG 1000ML POUR BTL (IV SOLUTION) ×4 IMPLANT
PACK C SECTION AR (MISCELLANEOUS) ×4 IMPLANT
PAD OB MATERNITY 4.3X12.25 (PERSONAL CARE ITEMS) ×4 IMPLANT
PAD PREP 24X41 OB/GYN DISP (PERSONAL CARE ITEMS) ×4 IMPLANT
RTRCTR C-SECT PINK 25CM LRG (MISCELLANEOUS) ×4 IMPLANT
SUT CHROMIC 0 CT 1 (SUTURE) ×4 IMPLANT
SUT MNCRL AB 4-0 PS2 18 (SUTURE) ×4 IMPLANT
SUT PDS AB 1 TP1 96 (SUTURE) ×4 IMPLANT
SUT PLAIN 3-0 (SUTURE) ×4 IMPLANT
SUT VIC AB 0 CT1 36 (SUTURE) ×12 IMPLANT
SUT VIC AB 2-0 CT1 36 (SUTURE) ×4 IMPLANT
SYR 30ML LL (SYRINGE) ×8 IMPLANT

## 2017-10-03 NOTE — Transfer of Care (Signed)
Immediate Anesthesia Transfer of Care Note  Patient: Tiffany Guzman  Procedure(s) Performed: Procedure(s): OOPHORECTOMY (Left) OVARIAN CYSTECTOMY (Left)  Patient Location: PACU  Anesthesia Type:General  Level of Consciousness: sedated  Airway & Oxygen Therapy: Patient Spontanous Breathing and Patient connected to face mask oxygen  Post-op Assessment: Report given to RN and Post -op Vital signs reviewed and stable  Post vital signs: Reviewed and stable  Last Vitals:  Vitals:   10/03/17 1040 10/03/17 1339  BP: (!) 152/89 117/85  Pulse: (!) 105   Resp:  15  Temp: 36.7 C 37 C  SpO2:  371%    Complications: No apparent anesthesia complications

## 2017-10-03 NOTE — Anesthesia Preprocedure Evaluation (Signed)
Anesthesia Evaluation  Patient identified by MRN, date of birth, ID band Patient awake    Reviewed: Allergy & Precautions, NPO status , Patient's Chart, lab work & pertinent test results  History of Anesthesia Complications Negative for: history of anesthetic complications  Airway Mallampati: II  TM Distance: >3 FB Neck ROM: Full    Dental no notable dental hx.    Pulmonary neg pulmonary ROS, neg sleep apnea, neg COPD,    breath sounds clear to auscultation- rhonchi (-) wheezing      Cardiovascular Exercise Tolerance: Good (-) hypertension(-) CAD, (-) Past MI, (-) Cardiac Stents and (-) CABG  Rhythm:Regular Rate:Normal - Systolic murmurs and - Diastolic murmurs    Neuro/Psych  Headaches, PSYCHIATRIC DISORDERS Anxiety    GI/Hepatic Neg liver ROS, GERD  ,  Endo/Other  negative endocrine ROSneg diabetes  Renal/GU negative Renal ROS     Musculoskeletal   Abdominal Gravid abdomen  Peds  Hematology negative hematology ROS (+)   Anesthesia Other Findings Past Medical History: No date: Attention deficit hyperactivity disorder (ADHD) No date: Chronic upper back pain No date: GERD (gastroesophageal reflux disease) No date: Migraine with aura     Comment:  since elementary school   Reproductive/Obstetrics (+) Pregnancy Dermoid cyst                              Lab Results  Component Value Date   WBC 13.2 (H) 10/01/2017   HGB 12.1 10/01/2017   HCT 35.4 10/01/2017   MCV 83.9 10/01/2017   PLT 225 10/01/2017    Anesthesia Physical Anesthesia Plan  ASA: II  Anesthesia Plan: Spinal   Post-op Pain Management:    Induction:   PONV Risk Score and Plan: 2 and Ondansetron  Airway Management Planned: Natural Airway  Additional Equipment:   Intra-op Plan:   Post-operative Plan:   Informed Consent: I have reviewed the patients History and Physical, chart, labs and discussed the  procedure including the risks, benefits and alternatives for the proposed anesthesia with the patient or authorized representative who has indicated his/her understanding and acceptance.   Dental advisory given  Plan Discussed with: CRNA and Anesthesiologist  Anesthesia Plan Comments: (Discussed with patient that we may convert to [place] if needed for dermoid removal after delivery of the baby)        Anesthesia Quick Evaluation

## 2017-10-03 NOTE — Progress Notes (Signed)
Patient currently refusing monitoring.

## 2017-10-03 NOTE — Anesthesia Post-op Follow-up Note (Signed)
Anesthesia QCDR form completed.        

## 2017-10-03 NOTE — Progress Notes (Signed)
CHIEF COMPLAINT:   Chief Complaint  Patient presents with  . Emesis    Subjective  Patient has been in the hospital since 10/01/17. She presented with emesis and abdominal pain. Evaluation has showed a known left dermoid cyst is enlarges. No other system has been identified as a source of her pain. Patient was offered surgery yesterday, but declined. I again discussed surgery with the patient. She has concerns about postoperative pain and does not want a vertical skin incision. I discussed with her that a transverse skin incision would limit our exposure and might necessitate the delivery of her infant to reach the oophorectomy.   Discussed the benefits of performing the surgery today in a controlled environment versus over the weekend or an an emergency. I offered the patient transfer to [facility] a tertiary care facility, but she declined. While I was having the discussion with the patient she was continuously vomiting and her pain ws not controlled.  She was refusing fetal monitoring this morning because there monitors were irritating her stomach.   Objective   Examination:  General exam: Appears calm and comfortable  Respiratory system: Clear to auscultation. Respiratory effort normal. HEENT: [place]/AT, PERRLA, no thrush, no stridor. Cardiovascular system: S1 & S2 heard, RRR. No JVD, murmurs, rubs, gallops or clicks. No pedal edema. Gastrointestinal system: Abdomen is nondistended, soft and nontender. No organomegaly or masses felt. Normal bowel sounds heard. Central nervous system: Alert and oriented. No focal neurological deficits. Extremities: Symmetric 5 x 5 power. Skin: No rashes, lesions or ulcers Psychiatry: Judgement and insight appear normal. Mood & affect appropriate.   VITALS:  axillary temperature is 97.8 F (36.6 C). Her blood pressure is 129/86 and her pulse is 82. Her respiration is 16 and oxygen saturation is 99%.   I personally reviewed Labs under Results  section.  Radiology Reports US Abdomen Complete  Result Date: 10/02/2017 CLINICAL DATA:  Abdominal pain and pregnancy EXAM: ABDOMEN ULTRASOUND COMPLETE COMPARISON:  CT abdomen pelvis 03/25/2015 FINDINGS: Gallbladder: No gallstones or wall thickening visualized. No sonographic Murphy sign noted by sonographer. Common bile duct: Diameter: 3.0 mm Liver: No focal lesion identified. Within normal limits in parenchymal echogenicity. Portal vein is patent on color Doppler imaging with normal direction of blood flow towards the liver. IVC: No abnormality visualized. Pancreas: Visualized portion unremarkable. Spleen: Size and appearance within normal limits. Right Kidney: Length: 12.3 cm. Echogenicity within normal limits. No mass or hydronephrosis visualized. Left Kidney: Length: 11.1 cm. Echogenicity within normal limits. No mass or hydronephrosis visualized. Abdominal aorta: No aneurysm visualized. Other findings: Gravid uterus noted. Fetus evaluated yesterday by ultrasound IMPRESSION: Negative abdominal ultrasound. Electronically Signed   By: Franchot Gallo M.D.   On: 10/02/2017 17:09   US Ob Limited  Result Date: 10/01/2017 CLINICAL DATA:  22 y/o F; abdominal pain. Dermoid cyst of left ovary. EXAM: LIMITED OBSTETRIC ULTRASOUND AN DOPPLER COMPARISON:  09/04/2017 MRI of the abdomen and pelvis. FINDINGS: Number of Fetuses: 1 Heart Rate:  141 bpm Movement: Yes Presentation: Cephalic Placental Location: Anterior Previa: No Amniotic Fluid (Subjective):  Within normal limits. BPD:  8.14cm 32w 5d MATERNAL FINDINGS: Cervix:  Appears closed.  3.5 cm in length. Uterus/Adnexae: 1.8 cm echogenic focus in the right ovary, probably corpus luteum. Complex mass of the left adnexa with both large solid and cystic regions measuring 12.6 x 6.6 x 8.1 cm in total. Solid components are largely hyperechoic and the the cystic region measures up to 7.6 cm. Arterial and venous Doppler signal is  detected at the periphery of the cystic  component. Pulsed Doppler evaluation of both ovaries demonstrates normal low-resistance arterial and venous waveforms. Other findings No free fluid. IMPRESSION: 1. Single live intrauterine pregnancy with estimated gestational age [redacted] weeks 5 days. 2. Left adnexal complex solid and cystic mass measuring up to 12.6 cm characterized as dermoid on prior MRI of the abdomen and pelvis. Arterial and venous doppler is detected at the periphery of the cystic component. No definite discrete left ovary identified. 3. Unremarkable appearance of right ovary. This exam is performed on an emergent basis and does not comprehensively evaluate fetal size, dating, or anatomy; follow-up complete OB US should be considered if further fetal assessment is warranted. Electronically Signed   By: Kristine Garbe M.D.   On: 10/01/2017 18:32   US Abdominal Pelvic Art/vent Flow Doppler  Result Date: 10/01/2017 CLINICAL DATA:  22 y/o F; abdominal pain. Dermoid cyst of left ovary. EXAM: LIMITED OBSTETRIC ULTRASOUND AN DOPPLER COMPARISON:  09/04/2017 MRI of the abdomen and pelvis. FINDINGS: Number of Fetuses: 1 Heart Rate:  141 bpm Movement: Yes Presentation: Cephalic Placental Location: Anterior Previa: No Amniotic Fluid (Subjective):  Within normal limits. BPD:  8.14cm 32w 5d MATERNAL FINDINGS: Cervix:  Appears closed.  3.5 cm in length. Uterus/Adnexae: 1.8 cm echogenic focus in the right ovary, probably corpus luteum. Complex mass of the left adnexa with both large solid and cystic regions measuring 12.6 x 6.6 x 8.1 cm in total. Solid components are largely hyperechoic and the the cystic region measures up to 7.6 cm. Arterial and venous Doppler signal is detected at the periphery of the cystic component. Pulsed Doppler evaluation of both ovaries demonstrates normal low-resistance arterial and venous waveforms. Other findings No free fluid. IMPRESSION: 1. Single live intrauterine pregnancy with estimated gestational age [redacted] weeks 5  days. 2. Left adnexal complex solid and cystic mass measuring up to 12.6 cm characterized as dermoid on prior MRI of the abdomen and pelvis. Arterial and venous doppler is detected at the periphery of the cystic component. No definite discrete left ovary identified. 3. Unremarkable appearance of right ovary. This exam is performed on an emergent basis and does not comprehensively evaluate fetal size, dating, or anatomy; follow-up complete OB US should be considered if further fetal assessment is warranted. Electronically Signed   By: Kristine Garbe M.D.   On: 10/01/2017 18:32       Assessment/Plan:  21yo G1P0 at 33 weeks 0 days. 1. Acute abdominal pain, possibly related to an enlarged left dermoid cyst, possible ovarian torsion.  2. Status post betamethasone 3. Patient will consider surgery 4.  Will consult NICU for consultation regarding possible preterm delivery.   Code Status: Full  Family Communication: sister present in room   DVT Prophylaxis  Lovenox - Heparin - SCDs No, bleeding risk, pregnant  Time spent: 20 minutes   LOS: 0 days   [place]

## 2017-10-03 NOTE — Progress Notes (Signed)
Patient has been doing well postoperatively. FHR 140s baseline, moderate variability, no decelerations. 1 15x15 acceleration. Will continue to monitor for at least 4 hours after surgery. Patient is having uterine contractions. This is likely related to uterine irritability after the surgery. Will give terbutaline.   Adrian Prows MD [facility], [facility] 10/03/17 2:48 PM

## 2017-10-03 NOTE — Consult Note (Signed)
Neonatology Consult to Antenatal Patient:  I was asked by Rod Can, CNM for Dr. Glennon Mac to see this patient in order to provide antenatal counseling due to possible preterm delivery for maternal indications.  Ms. Dauphinee was admitted 3/13 and is a G1P0, now 10 0/[redacted] weeks GA. Her baby is a female. She was admitted due to acute pain from a large ovarian cyst, which is felt to require removal due to symptoms of acute abdomen. She has not had active labor. She got 2 doses of BMZ on 3/13-14. Surgical approach has been discussed with the patient and she does not want a vertical incision, which would improve the chance that the baby could be left undelivered while the surgical team remove the cyst. She has been getting Stadol, Dilaudid, and Morphine for pain. Her UDS was positive only for marijuana.  I spoke with the patient alone. She was sitting up on the edge of the bed and appeared very uncomfortable, in pain, but she appeared able to understand our discussion. We discussed the possibility of delivery later today, including usual DR management, possible respiratory complications and need for support, IV access,  LOS, Mortality and Morbidity, and long term outcomes. I informed her that, given the baby is a white female, his risk for RDS is increased and he would probably require respiratory support, possibly including a mechanical ventilator. She did not have any questions at this time. I offered a NICU tour to any interested family members and would be glad to come back if she has more questions later.  Thank you for asking me to see this patient.  Real Cons, MD Neonatologist  The total length of face-to-face or floor/unit time for this encounter was 25 minutes. Counseling and/or coordination of care was 15 minutes of the above.

## 2017-10-03 NOTE — Op Note (Signed)
Operative Report 10/03/17 2:04 PM  Indications: Severe Abdominal Pain with nausea and vomiting in pregnancy, suspected ovarian torsion.  Pre-operative Diagnosis: Intrauterine pregnancy [redacted]w[redacted]d ; with enlarged 12 cm dermoid cyst on left ovary Post-operative Diagnosis: same Procedure: Left oophorectomy Surgeon: Adrian Prows MD Assistant(s): Prentice Docker MD Anesthesia: Spinal anesthesia Estimated Blood Loss:less than 50  Complications: None; patient tolerated the procedure well. Disposition: PACU - hemodynamically stable. Condition: stable  Findings: 12 cm left ovarian dermoid cyst  Procedure Details :  The patient was taken to Operating Room, identified as the correct patient and the procedure verified as C-Section Delivery. A Time Out was held and the above information confirmed. After induction of anesthesia, the patient was draped and prepped in the usual sterile manner. A Pfannenstiel incision was made and carried down through the subcutaneous tissue to the fascia. Fascial incision was made and extended transversely with the Mayo scissors. The fascia was separated from the underlying rectus tissue superiorly and inferiorly. The peritoneum was identified and entered bluntly. Peritoneal incision was extended longitudinally by stretching. An Alexis retractor was placed into the abdomen. Care was taken to make sure that bowel was not under the retractor then the retractor was folded until excellent exposure was mage. A single laparotomy sponge was tagged and placed in the abdomen the pack the bowel that was present on the left side.  The surgeons hand was then used to successfully deliver the left ovary out of the cul de sac and into the surgical field. The left ovary was enlarged with a dermoid cyst. The left fallopian tube appeared normal. There was not obvious torsion of the ovary. The ovary was elevated. Two free ties of 0-chromic suture were placed below the ovary to suture ligate the  left ovarian artery. The Ligasure was then used to coagulate and cut the ovarian pedicle above the chromic sutures. The surgeons hand  And a sponge were used to protect the uterus from the Ligasure device. The ovary was able to be removed and was passed off the field. The pedicle was examined and was hemostatic. The sponges were removed from the abdomen. The uterus was then displaced to the right. The right ovary was seen. There was a small ovarian cyst, but the ovary was otherwise normal in appearance. The appendix was visualized and was normal. Ultrasound was performed and the fetal heart rate was in the 130s.  The peritoneum was closed with 2-0 Vicryl with a running stitch. The rectus muscles were inspected and were hemostatic. The fascia was then reapproximated with looped PDS with a running stitch. Ultrasound was repeated and the fetal heart rate was in the 130s. Subcutaneous tissues are then irrigated with saline and hemostasis assured. The subcutaneous fat was approximated with 3-0  Plain. Skin was then closed with 4-0 monocryl suture in a subcuticular fashion followed by skin adhesive. Instrument, sponge, and needle counts were correct prior to the abdominal closure and at the conclusion of the case. Sponge count was repeated a second time at my request and was correct.  The patient tolerated the procedure well and was transferred to the recovery room in stable condition.   Adrian Prows MD [facility] 10/03/2017  2:04 PM

## 2017-10-03 NOTE — Progress Notes (Signed)
Patient ID: Tiffany Guzman, female   DOB: 1995-09-12, 22 y.o.   MRN: [id number]  Family meeting held with patient and her family. Her mother, father, two sisters and her significant other who is father of the baby was present in the room. Discussed with the patient that our recommendation was for her to have a vertical incision that would allow [place] the best chance of performing only a left oophorectomy and not delivering the infant. Discussed with the patient that this is a difficult circumstance since we can not say definitively the chance that if we were to do a vertical incision or a horizontal incision what the chance would be that we could perform the left oophorectomy without performing a cesarean section. We discussed that a vertical incision would give [place] the best surgical exposure and therefore the best chance of her continuing her pregnancy to term.  We hypothesized that the likelihood that we could perform a oophorectomy without a cesarean delivery would be about 70% with a vertical incision and 30% with a horizontal.  We discussed that the recommendation from MFM and neonatology was for the patient to have a vertical incision giving her the best chance of continuing her pregnancy to term and minimizing the risk to the baby for respiratory distress, needing to be intubated, or having other complications of preterm delivery.We made it abundantly clear that our recommendation was for a vertical incision to give the patient and her infant the best chance of prolonging pregnancy and maximizing surgical exposure. We gave the patient time to discuss this with her family and make a decision. After discussion with her family the patient decided that she wanted to have surgery by a horizontal incision. All of the risk of the surgery including damage to surrounding tissue, bleeding and infection were discussed and the patient was consented for surgery.  She understands that our plan will be to attempt an  oophorectomy through the horizontal incision. If this is not possible we will perform a cesarean delivery and then an oophorectomy.   Adrian Prows MD [facility] 10/03/17 12:14 PM

## 2017-10-04 LAB — COMPREHENSIVE METABOLIC PANEL
ALBUMIN: 2.7 g/dL — AB (ref 3.5–5.0)
ALBUMIN: 3 g/dL — AB (ref 3.5–5.0)
ALK PHOS: 81 U/L (ref 38–126)
ALT: 16 U/L (ref 14–54)
ALT: 17 U/L (ref 14–54)
ANION GAP: 7 (ref 5–15)
AST: 24 U/L (ref 15–41)
AST: 25 U/L (ref 15–41)
Alkaline Phosphatase: 86 U/L (ref 38–126)
Anion gap: 7 (ref 5–15)
BUN: 5 mg/dL — ABNORMAL LOW (ref 6–20)
BUN: 6 mg/dL (ref 6–20)
CALCIUM: 8.1 mg/dL — AB (ref 8.9–10.3)
CHLORIDE: 107 mmol/L (ref 101–111)
CO2: 21 mmol/L — ABNORMAL LOW (ref 22–32)
CO2: 21 mmol/L — AB (ref 22–32)
Calcium: 8.3 mg/dL — ABNORMAL LOW (ref 8.9–10.3)
Chloride: 108 mmol/L (ref 101–111)
Creatinine, Ser: 0.54 mg/dL (ref 0.44–1.00)
Creatinine, Ser: 0.61 mg/dL (ref 0.44–1.00)
GFR calc Af Amer: >60 mL/min (ref >60)
GFR calc non Af Amer: >60 mL/min (ref >60)
GLUCOSE: 85 mg/dL (ref 65–99)
GLUCOSE: 72 mg/dL (ref 65–99)
POTASSIUM: 3.6 mmol/L (ref 3.5–5.1)
Potassium: 4 mmol/L (ref 3.5–5.1)
SODIUM: 135 mmol/L (ref 135–145)
Sodium: 136 mmol/L (ref 135–145)
TOTAL PROTEIN: 5.7 g/dL — AB (ref 6.5–8.1)
Total Bilirubin: 0.7 mg/dL (ref 0.3–1.2)
Total Bilirubin: 0.9 mg/dL (ref 0.3–1.2)
Total Protein: 6 g/dL — ABNORMAL LOW (ref 6.5–8.1)

## 2017-10-04 LAB — PROTEIN / CREATININE RATIO, URINE
CREATININE, URINE: 58 mg/dL
Protein Creatinine Ratio: 0.28 mg/mg{Cre} — ABNORMAL HIGH (ref 0.00–0.15)
Total Protein, Urine: 16 mg/dL

## 2017-10-04 LAB — CBC
HEMATOCRIT: 28.8 % — AB (ref 35.0–47.0)
Hemoglobin: 9.9 g/dL — ABNORMAL LOW (ref 12.0–16.0)
MCH: 29 pg (ref 26.0–34.0)
MCHC: 34.2 g/dL (ref 32.0–36.0)
MCV: 84.8 fL (ref 80.0–100.0)
Platelets: 189 10*3/uL (ref 150–440)
RBC: 3.39 MIL/uL — ABNORMAL LOW (ref 3.80–5.20)
RDW: 13.9 % (ref 11.5–14.5)
WBC: 14.6 10*3/uL — ABNORMAL HIGH (ref 3.6–11.0)

## 2017-10-04 LAB — RPR: RPR Ser Ql: NONREACTIVE

## 2017-10-04 MED ORDER — CALCIUM CARBONATE ANTACID 500 MG PO CHEW
1.0000 | CHEWABLE_TABLET | Freq: Three times a day (TID) | ORAL | Status: DC
  Administered 2017-10-04 – 2017-10-07 (×9): 200 mg via ORAL
  Filled 2017-10-04 (×9): qty 1

## 2017-10-04 MED ORDER — PANTOPRAZOLE SODIUM 40 MG PO TBEC
40.0000 mg | DELAYED_RELEASE_TABLET | Freq: Every day | ORAL | Status: DC
  Administered 2017-10-04 – 2017-10-07 (×4): 40 mg via ORAL
  Filled 2017-10-04 (×4): qty 1

## 2017-10-04 MED ORDER — PANTOPRAZOLE SODIUM 40 MG PO TBEC: 40.0000 mg | DELAYED_RELEASE_TABLET | Freq: Every day | ORAL | Status: DC

## 2017-10-04 NOTE — Progress Notes (Signed)
   CHIEF COMPLAINT:   Chief Complaint  Patient presents with  . Emesis    Subjective  Patient has had some improvement in her pain. Has eaten some crackers, but stomach still feels unsettled to her. She has not ambulated today. She denies flatus. Is taking pain medicine as frequently as possible. +urination.  Denies reflux.     Objective   Examination:  General exam: Appears calm and comfortable  Respiratory system: Clear to auscultation. Respiratory effort normal. HEENT: [place]/AT, PERRLA, no thrush, no stridor. Cardiovascular system: S1 & S2 heard, RRR. No JVD, murmurs, rubs, gallops or clicks. No pedal edema. Gastrointestinal system: Abdomen is nondistended, soft and nontender. No organomegaly or masses felt. Normal bowel sounds heard. Central nervous system: Alert and oriented. No focal neurological deficits. Extremities: Symmetric 5 x 5 power. Skin: No rashes, lesions or ulcers Psychiatry: Judgement and insight appear normal. Mood & affect appropriate.  Incision clean dry and intact  VITALS:  height is 5\' 4"  (1.626 m) and weight is 142 lb (64.4 kg). Her oral temperature is 98.2 F (36.8 C). Her blood pressure is 132/88 and her pulse is 63. Her respiration is 18 and oxygen saturation is 98%.   I personally reviewed Labs under Results section. Labs pending.  Radiology Reports US Abdomen Complete  Result Date: 10/02/2017 CLINICAL DATA:  Abdominal pain and pregnancy EXAM: ABDOMEN ULTRASOUND COMPLETE COMPARISON:  CT abdomen pelvis 03/25/2015 FINDINGS: Gallbladder: No gallstones or wall thickening visualized. No sonographic Murphy sign noted by sonographer. Common bile duct: Diameter: 3.0 mm Liver: No focal lesion identified. Within normal limits in parenchymal echogenicity. Portal vein is patent on color Doppler imaging with normal direction of blood flow towards the liver. IVC: No abnormality visualized. Pancreas: Visualized portion unremarkable. Spleen: Size and appearance within  normal limits. Right Kidney: Length: 12.3 cm. Echogenicity within normal limits. No mass or hydronephrosis visualized. Left Kidney: Length: 11.1 cm. Echogenicity within normal limits. No mass or hydronephrosis visualized. Abdominal aorta: No aneurysm visualized. Other findings: Gravid uterus noted. Fetus evaluated yesterday by ultrasound IMPRESSION: Negative abdominal ultrasound. Electronically Signed   By: Franchot Gallo M.D.   On: 10/02/2017 17:09       Assessment/Plan:  22yo POD#1 left oophorectomy.  Encouraged ambulation Continue with PO pain medications Maalox prn GERD, patient declines zantac at this time. Hypokalemia, will discontinue k-dur today if this has resolved.  NST q shift   DVT Prophylaxis  Lovenox - Heparin - SCDs yes  Time spent: 10 minutes   LOS: 1 day   [name] Patient ID: Tiffany Guzman, female   DOB: May 18, 1996, 22 y.o.   MRN: [id number]

## 2017-10-04 NOTE — Anesthesia Post-op Follow-up Note (Signed)
  Anesthesia Pain Follow-up Note  Patient: Tiffany Guzman  Day #: 1  Date of Follow-up: 10/04/2017 Time: 9:16 AM  Last Vitals:  Vitals:   10/04/17 0600 10/04/17 0845  BP: 132/88 (!) 151/92  Pulse:  83  Resp:  17  Temp:  36.7 C  SpO2:  98%    Level of Consciousness: alert  Pain: moderate   Side Effects:Pruritis, mild  Catheter Site Exam:clean, dry     Plan: D/C from anesthesia care at surgeon's request  Derral Colucci

## 2017-10-04 NOTE — Anesthesia Postprocedure Evaluation (Addendum)
Anesthesia Post Note  Patient: Tiffany Guzman  Procedure(s) Performed: OOPHORECTOMY (Left Abdomen) OVARIAN CYSTECTOMY (Left Abdomen)  Patient location during evaluation: Mother Baby Anesthesia Type: Spinal Level of consciousness: awake and alert and oriented Pain management: pain level controlled Vital Signs Assessment: post-procedure vital signs reviewed and stable Respiratory status: spontaneous breathing, nonlabored ventilation and respiratory function stable Cardiovascular status: stable Postop Assessment: no headache, no backache and no signs of nausea or vomiting (no pruritis) Anesthetic complications: no     Last Vitals:  Vitals:   10/04/17 0600 10/04/17 0845  BP: 132/88 (!) 151/92  Pulse:  83  Resp:  17  Temp:  36.7 C  SpO2:  98%    Last Pain:  Vitals:   10/04/17 0845  TempSrc: Oral  PainSc: 8                  Hazelynn Mckenny

## 2017-10-04 NOTE — Progress Notes (Signed)
Pt transferred from M/B rm 350 to Obs 1 via wheelchair for NST. Patient reports good fetal movement, denies leaking of fluid, vaginal bleeding, or contractions. EFM applied and assessing.

## 2017-10-05 ENCOUNTER — Encounter: Payer: Self-pay | Admitting: Obstetrics and Gynecology

## 2017-10-05 MED ORDER — PROMETHAZINE HCL 25 MG PO TABS
25.0000 mg | ORAL_TABLET | Freq: Four times a day (QID) | ORAL | Status: DC | PRN
  Administered 2017-10-05: 25 mg via ORAL
  Filled 2017-10-05 (×2): qty 1

## 2017-10-05 MED ORDER — ONDANSETRON 4 MG PO TBDP
4.0000 mg | ORAL_TABLET | Freq: Four times a day (QID) | ORAL | Status: DC | PRN
Start: 2017-10-05 — End: 2017-10-07
  Administered 2017-10-06 – 2017-10-07 (×2): 4 mg via ORAL
  Filled 2017-10-05 (×3): qty 1

## 2017-10-05 NOTE — Plan of Care (Signed)
Afeb. Borderline elevated B/P; within call parameters. Denies epigastric pain, headache or visual disturbances. Reflexes are 2+. She does have small amount of sacral edema. Labia is sl swollen as well. Reports Fetal movement and denies vaginal bleeding or discharge. FHT was 148 and fetal  movement visualized. Abdomen is soft to palpation. Pt. C/o pain 8/10 and was given PRN pain medication. Lower Abd. Dressing is D&I and without s/s complications. Pt. Is alert and oriented with aprop. Affect. V/O of notifying Nurse if she has any contractions, decreased Fetal movement, leaking of fluid or bleeding from vagina. V/O of Fall Precautions.

## 2017-10-05 NOTE — Progress Notes (Signed)
Subjective: Patient reports nausea, incisional pain, + flatus and no problems voiding.  Pain is not well-controlled on current  analgesic regimen.  Tolerating po: Yes  No headaches, vision changes  Objective: Vital signs in last 24 hours: Temp:  [98.1 F (36.7 C)-98.5 F (36.9 C)] 98.1 F (36.7 C) (03/17 0804) Pulse Rate:  [78-96] 80 (03/17 0804) Resp:  [16-18] 16 (03/17 0804) BP: (135-149)/(85-93) 142/85 (03/17 0804) SpO2:  [99 %-100 %] 99 % (03/17 0804)    Intake/Output from previous day: 03/16 0701 - 03/17 0700 In: 367.9 [I.V.:367.9] Out: 500 [Urine:500]  Physical Examination: Physical Exam  Constitutional: She appears well-developed and well-nourished. No distress.  HENT:  Head: Normocephalic.  Cardiovascular: Normal rate.  Pulmonary/Chest: No respiratory distress.  Abdominal: Soft. Bowel sounds are normal. She exhibits no distension. There is tenderness. There is no guarding.  Incision D/C/I   Skin: She is not diaphoretic.    Labs: Results for orders placed or performed during the hospital encounter of 10/01/17 (from the past 72 hour(s))  Amylase     Status: None   Collection Time: 10/02/17  2:47 PM  Result Value Ref Range   Amylase 36 28 - 100 U/L    Comment: Performed at [facility], [address] [place], [place] [address]  Lipase, blood     Status: None   Collection Time: 10/02/17  2:47 PM  Result Value Ref Range   Lipase 33 11 - 51 U/L    Comment: Performed at [facility], [address] [place], [place] [address]  Comprehensive metabolic panel     Status: Abnormal   Collection Time: 10/02/17  2:47 PM  Result Value Ref Range   Sodium 139 135 - 145 mmol/L   Potassium 3.0 (L) 3.5 - 5.1 mmol/L   Chloride 110 101 - 111 mmol/L   CO2 19 (L) 22 - 32 mmol/L   Glucose, Bld 101 (H) 65 - 99 mg/dL   BUN <5 (L) 6 - 20 mg/dL   Creatinine, Ser 0.52 0.44 - 1.00 mg/dL   Calcium 8.4 (L) 8.9 - 10.3 mg/dL   Total Protein 6.7 6.5 - 8.1 g/dL   Albumin 3.1 (L) 3.5 - 5.0 g/dL   AST 21 15 - 41 U/L   ALT 13 (L) 14 - 54 U/L   Alkaline Phosphatase 101 38 - 126 U/L   Total Bilirubin 0.6 0.3 - 1.2 mg/dL   GFR calc non Af Amer >60 >60 mL/min   GFR calc Af Amer >60 >60 mL/min    Comment: (NOTE) The eGFR has been calculated using the CKD EPI equation. This calculation has not been validated in all clinical situations. eGFR's persistently <60 mL/min signify possible Chronic Kidney Disease.    Anion gap 10 5 - 15    Comment: Performed at [facility], [address] [place], [place] [address]  Basic metabolic panel     Status: Abnormal   Collection Time: 10/03/17 10:18 AM  Result Value Ref Range   Sodium 138 135 - 145 mmol/L   Potassium 3.3 (L) 3.5 - 5.1 mmol/L   Chloride 106 101 - 111 mmol/L   CO2 20 (L) 22 - 32 mmol/L   Glucose, Bld 123 (H) 65 - 99 mg/dL   BUN 6 6 - 20 mg/dL   Creatinine, Ser 0.60 0.44 - 1.00 mg/dL   Calcium 8.6 (L) 8.9 - 10.3 mg/dL   GFR calc non Af Amer >60 >60 mL/min   GFR calc Af  Amer >60 >60 mL/min    Comment: (NOTE) The eGFR has been calculated using the CKD EPI equation. This calculation has not been validated in all clinical situations. eGFR's persistently <60 mL/min signify possible Chronic Kidney Disease.    Anion gap 12 5 - 15    Comment: Performed at [facility], [address] [place], [place] [address]  CBC     Status: Abnormal   Collection Time: 10/03/17 10:18 AM  Result Value Ref Range   WBC 16.9 (H) 3.6 - 11.0 K/uL   RBC 3.78 (L) 3.80 - 5.20 MIL/uL   Hemoglobin 10.8 (L) 12.0 - 16.0 g/dL   HCT 32.1 (L) 35.0 - 47.0 %   MCV 85.0 80.0 - 100.0 fL   MCH 28.6 26.0 - 34.0 pg   MCHC 33.7 32.0 - 36.0 g/dL   RDW 14.3 11.5 - 14.5 %   Platelets 238 150 - 440 K/uL    Comment: Performed at [facility], [address] [place], [place] [address]  Type and screen [place]     Status: None   Collection Time: 10/03/17 10:18 AM  Result Value Ref  Range   ABO/RH(D) B POS    Antibody Screen NEG    Sample Expiration      10/06/2017 Performed at [facility], [address] [place], [place] [address]   ABO/Rh     Status: None   Collection Time: 10/03/17 10:38 AM  Result Value Ref Range   ABO/RH(D)      B POS Performed at [facility], [address] [place], [place] [address]   RPR     Status: None   Collection Time: 10/03/17 10:43 AM  Result Value Ref Range   RPR Ser Ql Non Reactive Non Reactive    Comment: (NOTE) Performed At: [facility] [address] [place], [place] [id number] Rush Farmer MD [phone] Performed at [facility], [address] [place], [place] [address]   CBC     Status: Abnormal   Collection Time: 10/04/17  8:14 AM  Result Value Ref Range   WBC 14.6 (H) 3.6 - 11.0 K/uL   RBC 3.39 (L) 3.80 - 5.20 MIL/uL   Hemoglobin 9.9 (L) 12.0 - 16.0 g/dL   HCT 28.8 (L) 35.0 - 47.0 %   MCV 84.8 80.0 - 100.0 fL   MCH 29.0 26.0 - 34.0 pg   MCHC 34.2 32.0 - 36.0 g/dL   RDW 13.9 11.5 - 14.5 %   Platelets 189 150 - 440 K/uL    Comment: Performed at [facility], [address] [place], [place] [address]  Comprehensive metabolic panel     Status: Abnormal   Collection Time: 10/04/17  8:14 AM  Result Value Ref Range   Sodium 136 135 - 145 mmol/L   Potassium 4.0 3.5 - 5.1 mmol/L   Chloride 108 101 - 111 mmol/L   CO2 21 (L) 22 - 32 mmol/L   Glucose, Bld 85 65 - 99 mg/dL   BUN 5 (L) 6 - 20 mg/dL   Creatinine, Ser 0.54 0.44 - 1.00 mg/dL   Calcium 8.1 (L) 8.9 - 10.3 mg/dL   Total Protein 5.7 (L) 6.5 - 8.1 g/dL   Albumin 2.7 (L) 3.5 - 5.0 g/dL   AST 24 15 - 41 U/L   ALT 16 14 - 54 U/L   Alkaline Phosphatase 81 38 - 126 U/L   Total Bilirubin 0.7 0.3 - 1.2 mg/dL  GFR calc non Af Amer >60 >60 mL/min   GFR calc Af Amer >60 >60 mL/min    Comment: (NOTE) The eGFR has been calculated using the CKD EPI equation. This calculation has not been validated in all  clinical situations. eGFR's persistently <60 mL/min signify possible Chronic Kidney Disease.    Anion gap 7 5 - 15    Comment: Performed at [facility], [address] [place], [place] [address]  Comprehensive metabolic panel     Status: Abnormal   Collection Time: 10/04/17  4:53 PM  Result Value Ref Range   Sodium 135 135 - 145 mmol/L   Potassium 3.6 3.5 - 5.1 mmol/L   Chloride 107 101 - 111 mmol/L   CO2 21 (L) 22 - 32 mmol/L   Glucose, Bld 72 65 - 99 mg/dL   BUN 6 6 - 20 mg/dL   Creatinine, Ser 0.61 0.44 - 1.00 mg/dL   Calcium 8.3 (L) 8.9 - 10.3 mg/dL   Total Protein 6.0 (L) 6.5 - 8.1 g/dL   Albumin 3.0 (L) 3.5 - 5.0 g/dL   AST 25 15 - 41 U/L   ALT 17 14 - 54 U/L   Alkaline Phosphatase 86 38 - 126 U/L   Total Bilirubin 0.9 0.3 - 1.2 mg/dL   GFR calc non Af Amer >60 >60 mL/min   GFR calc Af Amer >60 >60 mL/min    Comment: (NOTE) The eGFR has been calculated using the CKD EPI equation. This calculation has not been validated in all clinical situations. eGFR's persistently <60 mL/min signify possible Chronic Kidney Disease.    Anion gap 7 5 - 15    Comment: Performed at [facility], [place], [place] [address]  Protein / creatinine ratio, urine     Status: Abnormal   Collection Time: 10/04/17  4:56 PM  Result Value Ref Range   Creatinine, Urine 58 mg/dL   Total Protein, Urine 16 mg/dL    Comment: NO NORMAL RANGE ESTABLISHED FOR THIS TEST   Protein Creatinine Ratio 0.28 (H) 0.00 - 0.15 mg/mg[Cre]    Comment: Performed at [facility], [address] [place], [place] [address]     Assessment:  22 y.o. G1P0 at 41w2ds/p 2 Days Post-Op Procedure(s) (LRB): OOPHORECTOMY (Left) OVARIAN CYSTECTOMY (Left) : stable  Plan: 1) Pain: continue oxycodone and tylenol.  Discussed contraindication of NSAID's in pregnancy.  Encourage ambulation  2) Heme:Anemia: ferrous sulfate on discharge  3) FEN: add promethazine prn nausea as this  will likley also have some positive anelgesics properties  4) Prophylaxis: intermittent pneumatic compression boots.  5) Gestational HTN: Discussed if BP remains elevated recommendation would be to proceed with delivery at or around 37 weeks.  The question then becomes mode of delivery since she will be 4 week out from a laparotomy and whether to attempt a trial of labor or proceed with LTCS - recommend growth scan and twice weekly APT on discharge - continue daily NST while admitted to antepartum this hospitalization  6) Disposition: anticipate discharge [place] MD, [facility] [facility] 10/05/2017, 9:16 AM

## 2017-10-06 LAB — SURGICAL PATHOLOGY

## 2017-10-06 MED ORDER — DOCUSATE SODIUM 100 MG PO CAPS
100.0000 mg | ORAL_CAPSULE | Freq: Two times a day (BID) | ORAL | Status: DC | PRN
  Administered 2017-10-06: 100 mg via ORAL
  Filled 2017-10-06: qty 1

## 2017-10-06 NOTE — Progress Notes (Signed)
Pt brought over from mother/baby unit to L&D for NST. BP elevated. Rates lower abd pain to incision 8/10 intermittent.  Monitors applied/assessing.

## 2017-10-06 NOTE — Progress Notes (Signed)
Patient voided at this time. Void discarded. 24 hour urine started at this time. End time tomorrow 10/07/17 at 10:40 AM.   Hilbert Bible, RN

## 2017-10-06 NOTE — Progress Notes (Addendum)
S: The patient is in OBS3 for NST. She says she is feeling better today. She is up walking more and voiding easier. She has not yet had a bowel movement. She has been able to eat more and keep her food down. She admits drinking water, juice, gatorade. Her pain is slightly elevated at this time. She is due for pain medicine. She understands we would like to see the NST prior to administration of pain med. Her blood pressure is elevated this morning in the mild range which could be due to pain or developing preeclampsia. I explained to her that we will collect a 24 hour urine for a more accurate picture of proteinuria since her UPC is .28. She denies headache or visual changes or right upper quadrant epigastric pain. Her recent labs have been wnl otherwise except for slightly elevated WBC. One severe range pressure during NST- patient was found to have arm bent and talking on her phone. Recheck was once again in mild range. She has felt some braxton hicks contractions.   O: Temp:  [97.5 F (36.4 C)-98.3 F (36.8 C)] 98.2 F (36.8 C) (03/18 1000) Pulse Rate:  [75-99] 80 (03/18 1031) Resp:  [16-20] 18 (03/18 1031) BP: (131-167)/(81-101) 141/101 (03/18 1031) SpO2:  [93 %-100 %] 100 % (03/18 0920)   NST lasting for 30 minutes: 135 bpm baseline with moderate variability. +accelerations 15x15, -decelerations. Toco: 1 contraction noted lasting 40 seconds.   A: 22 yo female G1P0 at 33 weeks 3 days with reactive NST, Iron deficiency anemia  P: Continue antepartum care Q shift NST Pain medicine PRN Continue monitoring BP for signs of GHTN 24 hour urine collection Encourage ambulation Ferrous sulfate on discharge Disposition: anticipate discharge tomorrow  Rod Can, CNM

## 2017-10-06 NOTE — Progress Notes (Signed)
Patient back to labor and delivery for NST at this time.    Hilbert Bible, RN

## 2017-10-06 NOTE — Progress Notes (Signed)
Released to mychart

## 2017-10-06 NOTE — Progress Notes (Signed)
Appeared to rest well. Reports good Fetal movement. Denies Vaginal bleeding or leakage of Fluid. Abd. Has been soft to palpation. Honeycomb dressing D&I.

## 2017-10-07 ENCOUNTER — Encounter: Payer: Medicaid Other | Admitting: Maternal Newborn

## 2017-10-07 LAB — PROTEIN, URINE, 24 HOUR
Collection Interval-UPROT: 24 hours
Protein, 24H Urine: 273 mg/d — ABNORMAL HIGH (ref 50–100)
Protein, Urine: 14 mg/dL
Urine Total Volume-UPROT: 1950 mL

## 2017-10-07 MED ORDER — OXYCODONE HCL 5 MG PO TABS: 5.0000 mg | ORAL_TABLET | ORAL | 0 refills | Status: DC | PRN

## 2017-10-07 MED ORDER — DOCUSATE SODIUM 100 MG PO CAPS: 100.0000 mg | ORAL_CAPSULE | Freq: Two times a day (BID) | ORAL | Status: DC

## 2017-10-07 MED ORDER — MAGNESIUM HYDROXIDE 400 MG/5ML PO SUSP
30.0000 mL | Freq: Every evening | ORAL | Status: DC | PRN
  Filled 2017-10-07: qty 30

## 2017-10-07 MED ORDER — DOCUSATE SODIUM 100 MG PO CAPS: 100.0000 mg | ORAL_CAPSULE | Freq: Two times a day (BID) | ORAL | 0 refills | Status: DC

## 2017-10-07 NOTE — Discharge Summary (Signed)
Gynecology Physician Postoperative Discharge Summary  Patient ID: Tiffany Guzman MRN: [id number] DOB/AGE: 1996/01/17 22 y.o.  Admit Date: 10/01/2017 Discharge Date: 10/07/2017  Preoperative Diagnoses: Dermoid Cyst, Pregnancy 33 weeks  Procedures: Procedure(s) (LRB): OOPHORECTOMY (Left) OVARIAN CYSTECTOMY (Left)  Significant Labs: CBC Latest Ref Rng & Units 10/04/2017 10/03/2017 10/01/2017  WBC 3.6 - 11.0 K/uL 14.6(H) 16.9(H) 13.2(H)  Hemoglobin 12.0 - 16.0 g/dL 9.9(L) 10.8(L) 12.1  Hematocrit 35.0 - 47.0 % 28.8(L) 32.1(L) 35.4  Platelets 150 - 440 K/uL 189 238 225    Hospital Course:  Tiffany Guzman is a 22 y.o. G1P0  admitted for surgery related to painful ovarian dermoid cyst in 33 weeks pregnancy.  She underwent the procedures as mentioned above, her operation was uncomplicated. For further details about surgery, please refer to the operative report. Patient had an uncomplicated postoperative course other than elevated blood pressures; no s/sx/labs of preeclampsia. By time of discharge on POD#4, her pain was controlled on oral pain medications; she was ambulating, voiding without difficulty, tolerating regular diet and passing flatus. She was deemed stable for discharge to home.   Discharge Exam: Blood pressure (!) 158/90, pulse 79, temperature 98.2 F (36.8 C), temperature source Oral, resp. rate 18, height 5\' 4"  (1.626 m), weight 142 lb (64.4 kg), last menstrual period 02/14/2017, SpO2 99 %, unknown if currently breastfeeding. General appearance: alert and no distress  Resp: clear to auscultation bilaterally  Cardio: regular rate and rhythm  GI: soft, non-tender; bowel sounds normal; no masses, no organomegaly.  Incision: C/D/I, no erythema, no drainage noted Pelvic: scant blood on pad  Extremities: extremities normal, atraumatic, no cyanosis or edema and Homans sign is negative, no sign of DVT  Discharged Condition: Stable  Disposition: Discharge disposition: 01-Home  or Self Care       Discharge Instructions    Call MD for:  persistant nausea and vomiting   Complete by:  As directed    Call MD for:  redness, tenderness, or signs of infection (pain, swelling, redness, odor or green/yellow discharge around incision site)   Complete by:  As directed    Call MD for:  severe uncontrolled pain   Complete by:  As directed    Call MD for:  temperature >100.4   Complete by:  As directed    Change dressing (specify)   Complete by:  As directed    Dressing change: remove any dressings tomorrow   Diet general   Complete by:  As directed    Discharge instructions   Complete by:  As directed    Resume activities according to discharge instruction sheets   Increase activity slowly   Complete by:  As directed      Allergies as of 10/07/2017   No Known Allergies     Medication List    TAKE these medications   docusate sodium 100 MG capsule Commonly known as:  COLACE Take 1 capsule (100 mg total) by mouth 2 (two) times daily. To prevent constipation after surgery   ondansetron 4 MG disintegrating tablet Commonly known as:  ZOFRAN ODT Take 1 tablet (4 mg total) by mouth every 8 (eight) hours as needed for nausea or vomiting.   oxyCODONE 5 MG immediate release tablet Commonly known as:  Oxy IR/ROXICODONE Take 1 tablet (5 mg total) by mouth every 4 (four) hours as needed (pain scale 4-7).   PRENATAL VITAMIN PLUS LOW IRON 27-1 MG Tabs Take 1 tablet by mouth daily.   promethazine 25 MG tablet Commonly  known as:  PHENERGAN Take 0.5 tablets (12.5 mg total) by mouth every 6 (six) hours as needed for nausea or vomiting.            Discharge Care Instructions  (From admission, onward)        Start     Ordered   10/07/17 0000  Change dressing (specify)    Comments:  Dressing change: remove any dressings tomorrow   10/07/17 1522     Appt 2 days follow up at [facility], MD

## 2017-10-07 NOTE — Progress Notes (Signed)
4 Days Post-Op Procedure(s) (LRB): OOPHORECTOMY (Left) OVARIAN CYSTECTOMY (Left)  Subjective: Patient reports incisional pain, tolerating PO, + flatus, no problems voiding and no BM yet.  Denies ha, blurry vision, CP, SOB, epig pain, edema.  Good FM.  Objective: I have reviewed patient's vital signs, intake and output, medications and labs.  Abd: Min T, ND, gravid Incision: clean, dry and intact Extr: no calf T, no edema  NST reviewed.  A NST procedure was performed with FHR monitoring and a normal baseline established, appropriate time of 20-40 minutes of evaluation, and accels >2 seen w 15x15 characteristics.  Results show a REACTIVE NST.   Assessment: s/p Procedure(s): OOPHORECTOMY (Left) OVARIAN CYSTECTOMY (Left): stable  Plan: 1. s/p laparotomy for dermoid cyst, recovering well.  Needs time to recover prior to delivery, otherwise may have to have CS  2. Elevated BP since recovery began from surgery.  No other s/sx preeclampsia.  24hour urine for protein pending today.  Base outpatient vs inpatient monitoring on this result initially.   3. FWR.  NST R daily.  No s/sx PTL. 4. Reg diet, encourage ambulation. 5. Tylenol and oxycodone for pain (no Toradol or NSAIDs).  Counseled on short term use of narcotics planned and low risk to fetus.     LOS: 4 days    Tiffany Guzman 10/07/2017, 9:04 AM

## 2017-10-07 NOTE — Progress Notes (Signed)
POD #4 s/p left oophorectomy Subjective:  Feeling better this AM. Had decreased appetite and some intermittent nausea yesterday. Used Zofran twice. Passing a little gas. No BM yet. Baby active. Denies headaches or visual changes.  Ambulating on unit. Collecting a 24 hour urine after PC ratio was 280 yesterday Objective:  Blood pressure (!) 141/95, pulse 84, temperature 98 F (36.7 C), temperature source Oral, resp. rate 18, height _0  (1.626 m), weight 64.4 kg (142 lb), last menstrual period 02/14/2017, SpO2 100 %, unknown if currently breastfeeding.  General: WF in NAD, sitting up eating breakfast Pulmonary: no increased work of breathing, CTAB Heart: RRR with Grade II/VI systolic murmur best heard at pulmonic area Abdomen: BS active, fundus NT Incision: honey comb dressing C+D+I Extremities: no edema, no erythema, no tenderness  Results for orders placed or performed during the hospital encounter of 10/01/17 (from the past 72 hour(s))  Comprehensive metabolic panel     Status: Abnormal   Collection Time: 10/04/17  4:53 PM  Result Value Ref Range   Sodium 135 135 - 145 mmol/L   Potassium 3.6 3.5 - 5.1 mmol/L   Chloride 107 101 - 111 mmol/L   CO2 21 (L) 22 - 32 mmol/L   Glucose, Bld 72 65 - 99 mg/dL   BUN 6 6 - 20 mg/dL   Creatinine, Ser 0.61 0.44 - 1.00 mg/dL   Calcium 8.3 (L) 8.9 - 10.3 mg/dL   Total Protein 6.0 (L) 6.5 - 8.1 g/dL   Albumin 3.0 (L) 3.5 - 5.0 g/dL   AST 25 15 - 41 U/L   ALT 17 14 - 54 U/L   Alkaline Phosphatase 86 38 - 126 U/L   Total Bilirubin 0.9 0.3 - 1.2 mg/dL   GFR calc non Af Amer >60 >60 mL/min   GFR calc Af Amer >60 >60 mL/min    Comment: (NOTE) The eGFR has been calculated using the CKD EPI equation. This calculation has not been validated in all clinical situations. eGFR's persistently <60 mL/min signify possible Chronic Kidney Disease.    Anion gap 7 5 - 15    Comment: Performed at [facility], [address] [place], [place]  [id number]  Protein / creatinine ratio, urine     Status: Abnormal   Collection Time: 10/04/17  4:56 PM  Result Value Ref Range   Creatinine, Urine 58 mg/dL   Total Protein, Urine 16 mg/dL    Comment: NO NORMAL RANGE ESTABLISHED FOR THIS TEST   Protein Creatinine Ratio 0.28 (H) 0.00 - 0.15 mg/mg[Cre]    Comment: Performed at [facility], [address] [place], [place] [address]     Assessment:   22 y.o. G1P0 at 19wk4d,  postoperativeday # 4 s/p left oophorectomy-stable  Slow return of bowel function-Colace BID and MOM daily until BM  Ambulate  Daily NST  Acute blood loss anemia - hemodynamically stable and asymptomatic  - po vitamins with iron Elevated blood pressures: gestational hypertension vs preeclampsia  Awaiting 24 hour urine for protein. Disposition: pending lab results  Dalia Heading, CNM

## 2017-10-07 NOTE — Discharge Instructions (Signed)
Ovarian Cystectomy, Care After Refer to this sheet in the next few weeks. These instructions provide you with information on caring for yourself after your procedure. Your health care provider may also give you more specific instructions. Your treatment has been planned according to current medical practices, but problems sometimes occur. Call your health care provider if you have any problems or questions after your procedure. What can I expect after the procedure? After your procedure, it is typical to have the following:  Pain in your abdomen, especially at the incision sites. You will be given pain medicines to control the pain.  Tiredness. This is a normal part of the recovery process. Your energy level will return to normal over the next several weeks.  Constipation.  Follow these instructions at home:  Only take over-the-counter or prescription medicines as directed by your health care provider. Avoid taking aspirin because it can cause bleeding.  Follow your health care provider's instructions for when to resume your regular diet, exercise, and activities.  Take rest breaks during the day as needed.  Do not douche or have sexual intercourse until you have permission from your health care provider.  Remove or change any bandages (dressings) as directed by your health care provider.  Do not drive until your health care provider approves.  Take showers instead of baths until your health care provider tells you otherwise.  If you become constipated, you may: ? Use a mild laxative if your health care provider approves. ? Add more fruit and bran to your diet. ? Drink more fluids.  Take your temperature twice a day and record it.  Do not drink alcohol while taking pain medicine.  Try to have someone home with you for the first 1-2 weeks to help with your household activities.  Follow up with your health care provider as directed. Contact a health care provider if:  You have  a fever.  You feel sick to your stomach (nauseous) and throw up (vomit).  You have redness, swelling, or leakage of fluid at the incision site.  You have pain when you urinate or have blood in your urine.  You have a rash on your body.  You have pain or redness where the IV tube was inserted.  You have pain that is not relieved with medicine. Get help right away if:  You have chest pain or shortness of breath.  You feel dizzy or lightheaded.  You have increasing abdominal pain that is not relieved with medicines.  You have pain, swelling, or redness in your leg.  You see a yellowish white fluid (pus) coming from the incision.  Your incision is opening (edges not staying together). This information is not intended to replace advice given to you by your health care provider. Make sure you discuss any questions you have with your health care provider. Document Released: 04/28/2013 Document Revised: 12/14/2015 Document Reviewed: 02/17/2013 Elsevier Interactive Patient Education  2017 [employer].

## 2017-10-07 NOTE — Progress Notes (Signed)
Patient understands all discharge instructions and when to call the doctor and the proper medication administration, along with the need to attend follow up appointments. Patient discharge via wheelchair with RN.

## 2017-10-07 NOTE — Progress Notes (Signed)
Patient brought to L&D for NST and then taken back to room 350.  BP (!) 142/95 (BP Location: Left Arm)   Pulse 82   Temp 97.9 F (36.6 C) (Oral)   Resp 18   Ht 5\' 4"  (1.626 m)   Wt 142 lb (64.4 kg)   LMP 02/14/2017   SpO2 96%   BMI 24.37 kg/m   Reactive NST: baseline 130 with moderate variability, +accelerations 15x15, -decelerations.  Rod Can, CNM

## 2017-10-09 ENCOUNTER — Ambulatory Visit (INDEPENDENT_AMBULATORY_CARE_PROVIDER_SITE_OTHER): Payer: Medicaid Other | Admitting: Obstetrics & Gynecology

## 2017-10-09 VITALS — BP 140/80 | Wt 149.0 lb

## 2017-10-09 DIAGNOSIS — Z3400 Encounter for supervision of normal first pregnancy, unspecified trimester: Secondary | ICD-10-CM

## 2017-10-09 DIAGNOSIS — Z3A33 33 weeks gestation of pregnancy: Secondary | ICD-10-CM

## 2017-10-09 NOTE — Progress Notes (Signed)
  Subjective  Fetal Movement? yes Contractions? no Leaking Fluid? no Vaginal Bleeding? No Less pain now after Laparotomy Left Oophorectomy for Dermoid last week Denies ha, blurry vision, CP, SOB, epig pain, edema.    BP at home 140-160/80-105  Objective  BP 140/80   Wt 149 lb (67.6 kg)   LMP 02/14/2017   BMI 25.58 kg/m  General: NAD Pumonary: no increased work of breathing Abdomen: gravid, non-tender Extremities: no edema Psychiatric: mood appropriate, affect full  Assessment  22 y.o. G1P0 at [redacted]w[redacted]d by  11/21/2017, by Last Menstrual Period presenting for routine prenatal visit  Plan   Problem List Items Addressed This Visit      Other   Supervision of normal first pregnancy, antepartum   Pregnancy - Primary    Monitor BP here and sx's of HTN.  Cont rest.  Question wther her home cuff is calibrated correctly. Cont post op recovery and restrictions. Plan vag labor attempt if heals well from surgery.  Barnett Applebaum, MD, [facility], [facility] 10/09/2017  3:15 PM

## 2017-10-15 ENCOUNTER — Ambulatory Visit (INDEPENDENT_AMBULATORY_CARE_PROVIDER_SITE_OTHER): Payer: Medicaid Other | Admitting: Obstetrics and Gynecology

## 2017-10-15 ENCOUNTER — Encounter: Payer: Self-pay | Admitting: Obstetrics and Gynecology

## 2017-10-15 VITALS — BP 124/84 | Wt 140.0 lb

## 2017-10-15 DIAGNOSIS — Z3400 Encounter for supervision of normal first pregnancy, unspecified trimester: Secondary | ICD-10-CM

## 2017-10-15 DIAGNOSIS — Z3A34 34 weeks gestation of pregnancy: Secondary | ICD-10-CM

## 2017-10-15 DIAGNOSIS — O365939 Maternal care for other known or suspected poor fetal growth, third trimester, other fetus: Secondary | ICD-10-CM

## 2017-10-15 NOTE — Progress Notes (Signed)
Routine Prenatal Care Visit  Subjective  Tiffany Guzman is a 22 y.o. G1P0 at [redacted]w[redacted]d being seen today for ongoing prenatal care.  She is currently monitored for the following issues for this high-risk pregnancy and has Breast tenderness; Urinary incontinence; Anxiety; Supervision of normal first pregnancy, antepartum; Left ovarian cyst; Dermoid cyst of ovary affecting pregnancy, antepartum; Abdominal pain during pregnancy, third trimester; Dermoid cyst of left ovary; Pregnancy; Dermoid cyst; and Cyst, ovary, dermoid, left on their problem list.  ----------------------------------------------------------------------------------- Patient reports no complaints.   Contractions: Not present. Vag. Bleeding: None.  Movement: Present. Denies leaking of fluid.  ----------------------------------------------------------------------------------- The following portions of the patient's history were reviewed and updated as appropriate: allergies, current medications, past family history, past medical history, past social history, past surgical history and problem list. Problem list updated.   Objective  Blood pressure 124/84, weight 140 lb (63.5 kg), last menstrual period 02/14/2017, unknown if currently breastfeeding. Pregravid weight 120 lb (54.4 kg) Total Weight Gain 20 lb (9.072 kg) Urinalysis: Urine Protein: Negative Urine Glucose: Negative  Fetal Status: Fetal Heart Rate (bpm): 145 Fundal Height: 31 cm Movement: Present     General:  Alert, oriented and cooperative. Patient is in no acute distress.  Skin: Skin is warm and dry. No rash noted.   Cardiovascular: Normal heart rate noted  Respiratory: Normal respiratory effort, no problems with respiration noted  Abdomen: Soft, gravid, appropriate for gestational age. Pain/Pressure: Absent     Pelvic:  Cervical exam deferred        Extremities: Normal range of motion.  Edema: None  Mental Status: Normal mood and affect. Normal behavior. Normal  judgment and thought content.   Assessment   22 y.o. G1P0 at [redacted]w[redacted]d by  11/21/2017, by Last Menstrual Period presenting for routine prenatal visit  Plan   pregnancy Problems (from 04/14/17 to present)    Problem Noted Resolved   Left ovarian cyst 05/20/2017 by Will Bonnet, MD No   Supervision of normal first pregnancy, antepartum 04/14/2017 by Rod Can, CNM No   Overview Addendum 09/04/2017  5:14 PM by Malachy Mood, [place] Prenatal Labs  Dating L=8 Blood type: B/Positive/-- (09/24 1426)   Genetic Screen 1 Screen: [X]  10/30 negative Antibody:Negative (09/24 1426)  Anatomic [place] Normal female, bilateral dermoids left 8.80 x .8.6cm Rubella: 1.08 (09/24 1426) Varicella:    GTT 82 RPR: Non Reactive (09/24 1426)   Rhogam N/A HBsAg: Negative (09/24 1426)   TDaP vaccine                        Flu Shot: DECLINES HIV:   Negative  Baby Food                                GBS:   Contraception  Pap: NIL HPV positive  CBB     CS/VBAC    Support Person          Nausea and vomiting during pregnancy 05/12/2017 by Will Bonnet, MD 08/26/2017 by Malachy Mood, MD       Preterm labor symptoms and general obstetric precautions including but not limited to vaginal bleeding, contractions, leaking of fluid and fetal movement were reviewed in detail with the patient. Please refer to After Visit Summary for other counseling recommendations.   Return in about 1 day (around 10/16/2017) for schedule growth u/s and routine prenatal after.   -  [place] for measurement less than dates ASAP  Prentice Docker, MD, [facility], [facility] 10/15/2017 12:07 PM

## 2017-10-15 NOTE — Progress Notes (Incomplete)
Routine Prenatal Care Visit  Subjective  Tiffany Guzman is a 22 y.o. G1P0 at [redacted]w[redacted]d being seen today for ongoing prenatal care.  She is currently monitored for the following issues for this {Blank single:19197::"high-risk","low-risk"} pregnancy and has Breast tenderness; Urinary incontinence; Anxiety; Supervision of normal first pregnancy, antepartum; Left ovarian cyst; Dermoid cyst of ovary affecting pregnancy, antepartum; Abdominal pain during pregnancy, third trimester; Dermoid cyst of left ovary; Pregnancy; Dermoid cyst; and Cyst, ovary, dermoid, left on their problem list.  ----------------------------------------------------------------------------------- Patient reports {sx:14538}.   Contractions: Not present. Vag. Bleeding: None.  Movement: Present. Denies leaking of fluid.  ----------------------------------------------------------------------------------- The following portions of the patient's history were reviewed and updated as appropriate: allergies, current medications, past family history, past medical history, past social history, past surgical history and problem list. Problem list updated.   Objective  Blood pressure 124/84, weight 140 lb (63.5 kg), last menstrual period 02/14/2017, unknown if currently breastfeeding. Pregravid weight 120 lb (54.4 kg) Total Weight Gain 20 lb (9.072 kg) Urinalysis: Urine Protein: Negative Urine Glucose: Negative  Fetal Status: Fetal Heart Rate (bpm): 145 Fundal Height: 31 cm Movement: Present     General:  Alert, oriented and cooperative. Patient is in no acute distress.  Skin: Skin is warm and dry. No rash noted.   Cardiovascular: Normal heart rate noted  Respiratory: Normal respiratory effort, no problems with respiration noted  Abdomen: Soft, gravid, appropriate for gestational age. Pain/Pressure: Absent     Pelvic:  {Blank single:19197::"Cervical exam performed","Cervical exam deferred"}        Extremities: Normal range of motion.   Edema: None  Mental Status: Normal mood and affect. Normal behavior. Normal judgment and thought content.   Assessment   22 y.o. G1P0 at [redacted]w[redacted]d by  11/21/2017, by Last Menstrual Period presenting for {Blank single:19197::"routine","work-in"} prenatal visit  Plan   pregnancy Problems (from 04/14/17 to present)    Problem Noted Resolved   Left ovarian cyst 05/20/2017 by Will Bonnet, MD No   Supervision of normal first pregnancy, antepartum 04/14/2017 by Rod Can, CNM No   Overview Addendum 09/04/2017  5:14 PM by Malachy Mood, [place] Prenatal Labs  Dating L=8 Blood type: B/Positive/-- (09/24 1426)   Genetic Screen 1 Screen: [X]  10/30 negative Antibody:Negative (09/24 1426)  Anatomic [place] Normal female, bilateral dermoids left 8.80 x .8.6cm Rubella: 1.08 (09/24 1426) Varicella:    GTT 82 RPR: Non Reactive (09/24 1426)   Rhogam N/A HBsAg: Negative (09/24 1426)   TDaP vaccine                        Flu Shot: DECLINES HIV:   Negative  Baby Food                                GBS:   Contraception  Pap: NIL HPV positive  CBB     CS/VBAC    Support Person          Nausea and vomiting during pregnancy 05/12/2017 by Will Bonnet, MD 08/26/2017 by Malachy Mood, MD       {Blank single:19197::"Term","Preterm"} labor symptoms and general obstetric precautions including but not limited to vaginal bleeding, contractions, leaking of fluid and fetal movement were reviewed in detail with the patient. Please refer to After Visit Summary for other counseling recommendations.   Return in about 1 day (around 10/16/2017) for schedule growth u/s and  routine prenatal after.  Prentice Docker, MD, [facility], [facility] 10/15/2017 12:07 PM

## 2017-10-17 ENCOUNTER — Encounter: Payer: Self-pay | Admitting: Obstetrics and Gynecology

## 2017-10-17 ENCOUNTER — Ambulatory Visit (INDEPENDENT_AMBULATORY_CARE_PROVIDER_SITE_OTHER): Payer: Medicaid Other

## 2017-10-17 ENCOUNTER — Ambulatory Visit (INDEPENDENT_AMBULATORY_CARE_PROVIDER_SITE_OTHER): Payer: Medicaid Other | Admitting: Obstetrics and Gynecology

## 2017-10-17 VITALS — BP 122/74 | Wt 136.0 lb

## 2017-10-17 DIAGNOSIS — Z3A35 35 weeks gestation of pregnancy: Secondary | ICD-10-CM

## 2017-10-17 DIAGNOSIS — Z3400 Encounter for supervision of normal first pregnancy, unspecified trimester: Secondary | ICD-10-CM | POA: Diagnosis not present

## 2017-10-17 DIAGNOSIS — O365939 Maternal care for other known or suspected poor fetal growth, third trimester, other fetus: Secondary | ICD-10-CM

## 2017-10-17 NOTE — Progress Notes (Signed)
Routine Prenatal Care Visit  Subjective  Tiffany Guzman is a 22 y.o. G1P0 at [redacted]w[redacted]d being seen today for ongoing prenatal care.  She is currently monitored for the following issues for this low-risk pregnancy and has Breast tenderness; Urinary incontinence; Anxiety; Supervision of normal first pregnancy, antepartum; Left ovarian cyst; Dermoid cyst of ovary affecting pregnancy, antepartum; Abdominal pain during pregnancy, third trimester; Dermoid cyst of left ovary; Pregnancy; Dermoid cyst; and Cyst, ovary, dermoid, left on their problem list.  ----------------------------------------------------------------------------------- Patient reports no complaints.   Contractions: Not present. Vag. Bleeding: None.  Movement: Present. Denies leaking of fluid.  Growth u/s: 29.5th %ile, AFI 81.2 cm, cephalic presentation. Patient reassured. Discussed FL at 3.8%%ile.   ----------------------------------------------------------------------------------- The following portions of the patient's history were reviewed and updated as appropriate: allergies, current medications, past family history, past medical history, past social history, past surgical history and problem list. Problem list updated.  Objective  Blood pressure 122/74, weight 136 lb (61.7 kg), last menstrual period 02/14/2017, unknown if currently breastfeeding. Pregravid weight 120 lb (54.4 kg) Total Weight Gain 16 lb (7.258 kg) Urinalysis: Urine Protein: Negative Urine Glucose: Negative  Fetal Status: Fetal Heart Rate (bpm): Present   Movement: Present     General:  Alert, oriented and cooperative. Patient is in no acute distress.  Skin: Skin is warm and dry. No rash noted.   Cardiovascular: Normal heart rate noted  Respiratory: Normal respiratory effort, no problems with respiration noted  Abdomen: Soft, gravid, appropriate for gestational age. Pain/Pressure: Absent     Pelvic:  Cervical exam deferred        Extremities: Normal range  of motion.  Edema: None  Mental Status: Normal mood and affect. Normal behavior. Normal judgment and thought content.   Ultrasound report interpreted by me today: US Ob Follow Up  Result Date: 10/17/2017 ULTRASOUND REPORT Location: [facility] Date of Service: 10/17/2017 Indications:growth/afi Findings: Tiffany Guzman intrauterine pregnancy is visualized with FHR at 141 BPM. Biometrics give an (U/S) Gestational age of [redacted]w[redacted]d and an (U/S) EDD of 12/02/2017; this correlates with the clinically established Estimated Date of Delivery: 11/21/17. Fetal presentation is Cephalic. Placenta: Anterior, Grade 2. AFI: 10.18  cm Growth percentile is 29.5 % with FL 3.8 % EFW: 4lb7oz, 2,013 grams. Impression: 1. [redacted]w[redacted]d Viable Singleton Intrauterine pregnancy previously established criteria. 2. Growth is 29.5 %ile.  AFI is 10.18 cm. Recommendations: 1.Clinical correlation with the patient's History and Physical Exam. Tiffany Guzman, RDMS The ultrasound images and findings were reviewed by me and I agree with the above report. Prentice Docker, MD, [facility], [facility] 10/17/2017 2:48 PM    Assessment   22 y.o. G1P0 at [redacted]w[redacted]d by  11/21/2017, by Last Menstrual Period presenting for routine prenatal visit  Plan   pregnancy Problems (from 04/14/17 to present)    Problem Noted Resolved   Left ovarian cyst 05/20/2017 by Will Bonnet, MD No   Supervision of normal first pregnancy, antepartum 04/14/2017 by Rod Can, CNM No   Overview Addendum 09/04/2017  5:14 PM by Malachy Mood, MD    Clinic [facility] Prenatal Labs  Dating L=8 Blood type: B/Positive/-- (09/24 1426)   Genetic Screen 1 Screen: [X]  10/30 negative Antibody:Negative (09/24 1426)  Anatomic [place] Normal female, bilateral dermoids left 8.80 x .8.6cm Rubella: 1.08 (09/24 1426) Varicella:    GTT 82 RPR: Non Reactive (09/24 1426)   Rhogam N/A HBsAg: Negative (09/24 1426)   TDaP vaccine  Flu Shot: DECLINES  HIV:   Negative  Baby Food                                GBS:   Contraception  Pap: NIL HPV positive  CBB     CS/VBAC    Support Person          Nausea and vomiting during pregnancy 05/12/2017 by Will Bonnet, MD 08/26/2017 by Malachy Mood, MD      Preterm labor symptoms and general obstetric precautions including but not limited to vaginal bleeding, contractions, leaking of fluid and fetal movement were reviewed in detail with the patient. Please refer to After Visit Summary for other counseling recommendations.   -BPs seem to have normalized. Patient reports normal blood pressure at home, as well. Denies any symptoms at all today. She is even tolerating her diet better.   -GBS/aptima next visit  Return in about 1 week (around 10/24/2017) for Routine Prenatal Appointment.  Prentice Docker, MD, [facility], [facility] 10/17/2017 2:56 PM

## 2017-10-17 NOTE — Progress Notes (Incomplete)
Routine Prenatal Care Visit  Subjective  Tiffany Guzman is a 22 y.o. G1P0 at [redacted]w[redacted]d being seen today for ongoing prenatal care.  She is currently monitored for the following issues for this {Blank single:19197::"high-risk","low-risk"} pregnancy and has Breast tenderness; Urinary incontinence; Anxiety; Supervision of normal first pregnancy, antepartum; Left ovarian cyst; Dermoid cyst of ovary affecting pregnancy, antepartum; Abdominal pain during pregnancy, third trimester; Dermoid cyst of left ovary; Pregnancy; Dermoid cyst; and Cyst, ovary, dermoid, left on their problem list.  ----------------------------------------------------------------------------------- Patient reports {sx:14538}.   Contractions: Not present. Vag. Bleeding: None.  Movement: Present. Denies leaking of fluid.  ----------------------------------------------------------------------------------- The following portions of the patient's history were reviewed and updated as appropriate: allergies, current medications, past family history, past medical history, past social history, past surgical history and problem list. Problem list updated.   Objective  Blood pressure 122/74, weight 136 lb (61.7 kg), last menstrual period 02/14/2017, unknown if currently breastfeeding. Pregravid weight 120 lb (54.4 kg) Total Weight Gain 16 lb (7.258 kg) Urinalysis: Urine Protein: Negative Urine Glucose: Negative  Fetal Status: Fetal Heart Rate (bpm): Present   Movement: Present     General:  Alert, oriented and cooperative. Patient is in no acute distress.  Skin: Skin is warm and dry. No rash noted.   Cardiovascular: Normal heart rate noted  Respiratory: Normal respiratory effort, no problems with respiration noted  Abdomen: Soft, gravid, appropriate for gestational age. Pain/Pressure: Absent     Pelvic:  {Blank single:19197::"Cervical exam performed","Cervical exam deferred"}        Extremities: Normal range of motion.  Edema: None    Mental Status: Normal mood and affect. Normal behavior. Normal judgment and thought content.   Assessment   22 y.o. G1P0 at [redacted]w[redacted]d by  11/21/2017, by Last Menstrual Period presenting for {Blank single:19197::"routine","work-in"} prenatal visit  Plan   pregnancy Problems (from 04/14/17 to present)    Problem Noted Resolved   Left ovarian cyst 05/20/2017 by Will Bonnet, MD No   Supervision of normal first pregnancy, antepartum 04/14/2017 by Rod Can, CNM No   Overview Addendum 09/04/2017  5:14 PM by Malachy Mood, [place] Prenatal Labs  Dating L=8 Blood type: B/Positive/-- (09/24 1426)   Genetic Screen 1 Screen: [X]  10/30 negative Antibody:Negative (09/24 1426)  Anatomic [place] Normal female, bilateral dermoids left 8.80 x .8.6cm Rubella: 1.08 (09/24 1426) Varicella:    GTT 82 RPR: Non Reactive (09/24 1426)   Rhogam N/A HBsAg: Negative (09/24 1426)   TDaP vaccine                        Flu Shot: DECLINES HIV:   Negative  Baby Food                                GBS:   Contraception  Pap: NIL HPV positive  CBB     CS/VBAC    Support Person          Nausea and vomiting during pregnancy 05/12/2017 by Will Bonnet, MD 08/26/2017 by Malachy Mood, MD       {Blank single:19197::"Term","Preterm"} labor symptoms and general obstetric precautions including but not limited to vaginal bleeding, contractions, leaking of fluid and fetal movement were reviewed in detail with the patient. Please refer to After Visit Summary for other counseling recommendations.   Return in about 1 week (around 10/24/2017) for Routine Prenatal Appointment.  Tiffany Guzman  Glennon Mac, MD, [facility], [facility] 10/17/2017 2:56 PM  Growth scan today. No vb. No lof.

## 2017-10-27 ENCOUNTER — Ambulatory Visit (INDEPENDENT_AMBULATORY_CARE_PROVIDER_SITE_OTHER): Payer: Medicaid Other | Admitting: Obstetrics and Gynecology

## 2017-10-27 ENCOUNTER — Encounter: Payer: Self-pay | Admitting: Obstetrics and Gynecology

## 2017-10-27 ENCOUNTER — Encounter: Payer: Medicaid Other | Admitting: Obstetrics and Gynecology

## 2017-10-27 VITALS — BP 120/70 | Wt 141.0 lb

## 2017-10-27 DIAGNOSIS — Z3A36 36 weeks gestation of pregnancy: Secondary | ICD-10-CM

## 2017-10-27 DIAGNOSIS — Z3400 Encounter for supervision of normal first pregnancy, unspecified trimester: Secondary | ICD-10-CM

## 2017-10-27 DIAGNOSIS — O26893 Other specified pregnancy related conditions, third trimester: Secondary | ICD-10-CM

## 2017-10-27 DIAGNOSIS — R109 Unspecified abdominal pain: Secondary | ICD-10-CM

## 2017-10-27 DIAGNOSIS — O3480 Maternal care for other abnormalities of pelvic organs, unspecified trimester: Secondary | ICD-10-CM

## 2017-10-27 DIAGNOSIS — D279 Benign neoplasm of unspecified ovary: Secondary | ICD-10-CM

## 2017-10-27 DIAGNOSIS — F419 Anxiety disorder, unspecified: Secondary | ICD-10-CM

## 2017-10-27 NOTE — Progress Notes (Incomplete)
Routine Prenatal Care Visit  Subjective  Tiffany Guzman is a 22 y.o. G1P0 at [redacted]w[redacted]d being seen today for ongoing prenatal care.  She is currently monitored for the following issues for this {Blank single:19197::"high-risk","low-risk"} pregnancy and has Breast tenderness; Urinary incontinence; Anxiety; Supervision of normal first pregnancy, antepartum; Left ovarian cyst; Dermoid cyst of ovary affecting pregnancy, antepartum; Abdominal pain during pregnancy, third trimester; Dermoid cyst of left ovary; Pregnancy; Dermoid cyst; and Cyst, ovary, dermoid, left on their problem list.  ----------------------------------------------------------------------------------- Patient reports {sx:14538}.   Contractions: Irritability. Vag. Bleeding: None.  Movement: Present. Denies leaking of fluid.  ----------------------------------------------------------------------------------- The following portions of the patient's history were reviewed and updated as appropriate: allergies, current medications, past family history, past medical history, past social history, past surgical history and problem list. Problem list updated.   Objective  Blood pressure 120/70, weight 141 lb (64 kg), last menstrual period 02/14/2017, unknown if currently breastfeeding. Pregravid weight 120 lb (54.4 kg) Total Weight Gain 21 lb (9.526 kg) Urinalysis:      Fetal Status: Fetal Heart Rate (bpm): 140 Fundal Height: 34 cm Movement: Present     General:  Alert, oriented and cooperative. Patient is in no acute distress.  Skin: Skin is warm and dry. No rash noted.   Cardiovascular: Normal heart rate noted  Respiratory: Normal respiratory effort, no problems with respiration noted  Abdomen: Soft, gravid, appropriate for gestational age. Pain/Pressure: Absent     Pelvic:  {Blank single:19197::"Cervical exam performed","Cervical exam deferred"} Dilation: Closed Effacement (%): 50 Station: -3  Extremities: Normal range of motion.      Mental Status: Normal mood and affect. Normal behavior. Normal judgment and thought content.   Assessment   22 y.o. G1P0 at [redacted]w[redacted]d by  11/21/2017, by Last Menstrual Period presenting for {Blank single:19197::"routine","work-in"} prenatal visit  Plan   pregnancy Problems (from 04/14/17 to present)    Problem Noted Resolved   Left ovarian cyst 05/20/2017 by Will Bonnet, MD No   Supervision of normal first pregnancy, antepartum 04/14/2017 by Rod Can, CNM No   Overview Addendum 09/04/2017  5:14 PM by Malachy Mood, [place] Prenatal Labs  Dating L=8 Blood type: B/Positive/-- (09/24 1426)   Genetic Screen 1 Screen: [X]  10/30 negative Antibody:Negative (09/24 1426)  Anatomic [place] Normal female, bilateral dermoids left 8.80 x .8.6cm Rubella: 1.08 (09/24 1426) Varicella:    GTT 82 RPR: Non Reactive (09/24 1426)   Rhogam N/A HBsAg: Negative (09/24 1426)   TDaP vaccine                        Flu Shot: DECLINES HIV:   Negative  Baby Food                                GBS:   Contraception  Pap: NIL HPV positive  CBB     CS/VBAC    Support Person          Nausea and vomiting during pregnancy 05/12/2017 by Will Bonnet, MD 08/26/2017 by Malachy Mood, MD       {Blank single:19197::"Term","Preterm"} labor symptoms and general obstetric precautions including but not limited to vaginal bleeding, contractions, leaking of fluid and fetal movement were reviewed in detail with the patient. Please refer to After Visit Summary for other counseling recommendations.   Return in about 1 week (around 11/03/2017) for Routine Prenatal Appointment.  Prentice Docker,  MD, [facility], [facility] 10/27/2017 3:15 PM

## 2017-10-27 NOTE — Progress Notes (Signed)
Routine Prenatal Care Visit  Subjective  Tiffany Guzman is a 22 y.o. G1P0 at [redacted]w[redacted]d being seen today for ongoing prenatal care.  She is currently monitored for the following issues for this high-risk pregnancy and has Breast tenderness; Urinary incontinence; Anxiety; Supervision of normal first pregnancy, antepartum; Left ovarian cyst; Dermoid cyst of ovary affecting pregnancy, antepartum; Abdominal pain during pregnancy, third trimester; Dermoid cyst of left ovary; Pregnancy; Dermoid cyst; and Cyst, ovary, dermoid, left on their problem list.  ----------------------------------------------------------------------------------- Patient reports no complaints.   Contractions: Irritability. Vag. Bleeding: None.  Movement: Present. Denies leaking of fluid.  ----------------------------------------------------------------------------------- The following portions of the patient's history were reviewed and updated as appropriate: allergies, current medications, past family history, past medical history, past social history, past surgical history and problem list. Problem list updated.   Objective  Blood pressure 120/70, weight 141 lb (64 kg), last menstrual period 02/14/2017, unknown if currently breastfeeding. Pregravid weight 120 lb (54.4 kg) Total Weight Gain 21 lb (9.526 kg) Urinalysis:      Fetal Status: Fetal Heart Rate (bpm): 140 Fundal Height: 34 cm Movement: Present     General:  Alert, oriented and cooperative. Patient is in no acute distress.  Skin: Skin is warm and dry. No rash noted.   Cardiovascular: Normal heart rate noted  Respiratory: Normal respiratory effort, no problems with respiration noted  Abdomen: Soft, gravid, appropriate for gestational age. Pain/Pressure: Absent     Pelvic:  Cervical exam performed Dilation: Closed Effacement (%): 50 Station: -3  Extremities: Normal range of motion.     Mental Status: Normal mood and affect. Normal behavior. Normal judgment and  thought content.   Assessment   22 y.o. G1P0 at [redacted]w[redacted]d by  11/21/2017, by Last Menstrual Period presenting for routine prenatal visit  Plan   pregnancy Problems (from 04/14/17 to present)    Problem Noted Resolved   Left ovarian cyst 05/20/2017 by Will Bonnet, MD No   Supervision of normal first pregnancy, antepartum 04/14/2017 by Rod Can, CNM No   Overview Addendum 09/04/2017  5:14 PM by Malachy Mood, [place] Prenatal Labs  Dating L=8 Blood type: B/Positive/-- (09/24 1426)   Genetic Screen 1 Screen: [X]  10/30 negative Antibody:Negative (09/24 1426)  Anatomic [place] Normal female, bilateral dermoids left 8.80 x .8.6cm Rubella: 1.08 (09/24 1426) Varicella:    GTT 82 RPR: Non Reactive (09/24 1426)   Rhogam N/A HBsAg: Negative (09/24 1426)   TDaP vaccine                        Flu Shot: DECLINES HIV:   Negative  Baby Food                                GBS:   Contraception  Pap: NIL HPV positive  CBB     CS/VBAC    Support Person          Nausea and vomiting during pregnancy 05/12/2017 by Will Bonnet, MD 08/26/2017 by Malachy Mood, MD       Preterm labor symptoms and general obstetric precautions including but not limited to vaginal bleeding, contractions, leaking of fluid and fetal movement were reviewed in detail with the patient. Please refer to After Visit Summary for other counseling recommendations.   -GBS/Aptima today -after long discussion regarding trial of labor against abdominal wall that had surgery 6-7 weeks prior (pfannensteil  incision), discussed that it would likely be low risk to attempt vaginal delivery. Even still, she would like a c-section to avoid the risk. After discussing risks/benefits of both approaches, will schedule for c-section no 4/26.    Return in about 1 week (around 11/03/2017) for Routine Prenatal Appointment.  Prentice Docker, MD, [facility], [facility] 10/27/2017 3:15 PM

## 2017-10-28 ENCOUNTER — Telehealth: Payer: Self-pay | Admitting: Obstetrics and Gynecology

## 2017-10-28 NOTE — Telephone Encounter (Signed)
No answer, vm not set up.

## 2017-10-28 NOTE — Telephone Encounter (Signed)
Patient returned the call, and is aware of H&P at [facility] on 11/12/17 @ 10:10am w/ Dr. Glennon Mac, Pre-admit Testing to be scheduled for 11/13/17 (10:00 or 10:15am requested), and OR on 11/14/17.

## 2017-10-28 NOTE — Telephone Encounter (Signed)
-----   Message from Will Bonnet, MD sent at 10/27/2017  3:14 PM EDT ----- Regarding: Schedule surgery Surgery Booking Request Patient Full Name:  Tiffany Guzman  MRN: [id number]  DOB: Oct 25, 1995  Surgeon: Prentice Docker, MD  Requested Surgery Date and Time: 11/14/17 Primary Diagnosis AND Code: elective primary Secondary Diagnosis and Code:  Surgical Procedure: Cesarean Section L&D Notification: Yes Admission Status: surgery admit Length of Surgery: 60 minutes Special Case Needs: none H&P: tbd (date) Phone Interview???: no Interpreter: Language:  Medical Clearance: no Special Scheduling Instructions: Patient had surgery at 33 weeks (open abdominal) and want a c-section due to being afraid the abdominal incision will open.

## 2017-10-31 LAB — GC/CHLAMYDIA PROBE AMP
CHLAMYDIA, DNA PROBE: NEGATIVE
NEISSERIA GONORRHOEAE BY PCR: NEGATIVE

## 2017-10-31 LAB — STREP GP B NAA: Strep Gp B NAA: NEGATIVE

## 2017-11-03 ENCOUNTER — Ambulatory Visit (INDEPENDENT_AMBULATORY_CARE_PROVIDER_SITE_OTHER): Payer: Medicaid Other | Admitting: Obstetrics and Gynecology

## 2017-11-03 ENCOUNTER — Encounter: Payer: Self-pay | Admitting: Obstetrics and Gynecology

## 2017-11-03 VITALS — BP 122/74 | Wt 145.0 lb

## 2017-11-03 DIAGNOSIS — Z3A37 37 weeks gestation of pregnancy: Secondary | ICD-10-CM

## 2017-11-03 DIAGNOSIS — Z3400 Encounter for supervision of normal first pregnancy, unspecified trimester: Secondary | ICD-10-CM

## 2017-11-03 DIAGNOSIS — D279 Benign neoplasm of unspecified ovary: Secondary | ICD-10-CM

## 2017-11-03 DIAGNOSIS — O3480 Maternal care for other abnormalities of pelvic organs, unspecified trimester: Secondary | ICD-10-CM

## 2017-11-03 NOTE — Progress Notes (Incomplete)
Routine Prenatal Care Visit  Subjective  Tiffany Guzman is a 22 y.o. G1P0 at [redacted]w[redacted]d being seen today for ongoing prenatal care.  She is currently monitored for the following issues for this {Blank single:19197::"high-risk","low-risk"} pregnancy and has Breast tenderness; Urinary incontinence; Anxiety; Supervision of normal first pregnancy, antepartum; Left ovarian cyst; Dermoid cyst of ovary affecting pregnancy, antepartum; Abdominal pain during pregnancy, third trimester; Dermoid cyst of left ovary; Pregnancy; Dermoid cyst; and Cyst, ovary, dermoid, left on their problem list.  ----------------------------------------------------------------------------------- Patient reports {sx:14538}.   Contractions: Not present. Vag. Bleeding: None.  Movement: Present. Denies leaking of fluid.  ----------------------------------------------------------------------------------- The following portions of the patient's history were reviewed and updated as appropriate: allergies, current medications, past family history, past medical history, past social history, past surgical history and problem list. Problem list updated.   Objective  Blood pressure 122/74, weight 145 lb (65.8 kg), last menstrual period 02/14/2017, unknown if currently breastfeeding. Pregravid weight 120 lb (54.4 kg) Total Weight Gain 25 lb (11.3 kg) Urinalysis:      Fetal Status: Fetal Heart Rate (bpm): 135 Fundal Height: 36 cm Movement: Present     General:  Alert, oriented and cooperative. Patient is in no acute distress.  Skin: Skin is warm and dry. No rash noted.   Cardiovascular: Normal heart rate noted  Respiratory: Normal respiratory effort, no problems with respiration noted  Abdomen: Soft, gravid, appropriate for gestational age. Pain/Pressure: Absent     Pelvic:  {Blank single:19197::"Cervical exam performed","Cervical exam deferred"}        Extremities: Normal range of motion.     Mental Status: Normal mood and affect.  Normal behavior. Normal judgment and thought content.   Assessment   22 y.o. G1P0 at [redacted]w[redacted]d by  11/21/2017, by Last Menstrual Period presenting for {Blank single:19197::"routine","work-in"} prenatal visit  Plan   pregnancy Problems (from 04/14/17 to present)    Problem Noted Resolved   Left ovarian cyst 05/20/2017 by Will Bonnet, MD No   Supervision of normal first pregnancy, antepartum 04/14/2017 by Rod Can, CNM No   Overview Addendum 09/04/2017  5:14 PM by Malachy Mood, MD    Clinic [facility] Prenatal Labs  Dating L=8 Blood type: B/Positive/-- (09/24 1426)   Genetic Screen 1 Screen: [X]  10/30 negative Antibody:Negative (09/24 1426)  Anatomic [place] Normal female, bilateral dermoids left 8.80 x .8.6cm Rubella: 1.08 (09/24 1426) Varicella:    GTT 82 RPR: Non Reactive (09/24 1426)   Rhogam N/A HBsAg: Negative (09/24 1426)   TDaP vaccine                        Flu Shot: DECLINES HIV:   Negative  Baby Food                                GBS:   Contraception  Pap: NIL HPV positive  CBB     CS/VBAC    Support Person          Nausea and vomiting during pregnancy 05/12/2017 by Will Bonnet, MD 08/26/2017 by Malachy Mood, MD       {Blank single:19197::"Term","Preterm"} labor symptoms and general obstetric precautions including but not limited to vaginal bleeding, contractions, leaking of fluid and fetal movement were reviewed in detail with the patient. Please refer to After Visit Summary for other counseling recommendations.   Return in about 1 week (around 11/10/2017) for Routine Prenatal Appointment.  Prentice Docker,  MD, [facility], [facility] 11/03/2017 12:28 PM

## 2017-11-03 NOTE — Progress Notes (Signed)
Routine Prenatal Care Visit  Subjective  Tiffany Guzman is a 22 y.o. G1P0 at [redacted]w[redacted]d being seen today for ongoing prenatal care.  She is currently monitored for the following issues for this high-risk pregnancy and has Breast tenderness; Urinary incontinence; Anxiety; Supervision of normal first pregnancy, antepartum; Left ovarian cyst; Dermoid cyst of ovary affecting pregnancy, antepartum; Abdominal pain during pregnancy, third trimester; Dermoid cyst of left ovary; Pregnancy; Dermoid cyst; and Cyst, ovary, dermoid, left on their problem list.  ----------------------------------------------------------------------------------- Patient reports no complaints.   Contractions: Not present. Vag. Bleeding: None.  Movement: Present. Denies leaking of fluid.  ----------------------------------------------------------------------------------- The following portions of the patient's history were reviewed and updated as appropriate: allergies, current medications, past family history, past medical history, past social history, past surgical history and problem list. Problem list updated.   Objective  Blood pressure 122/74, weight 145 lb (65.8 kg), last menstrual period 02/14/2017, unknown if currently breastfeeding. Pregravid weight 120 lb (54.4 kg) Total Weight Gain 25 lb (11.3 kg) Urinalysis:      Fetal Status: Fetal Heart Rate (bpm): 135 Fundal Height: 36 cm Movement: Present     General:  Alert, oriented and cooperative. Patient is in no acute distress.  Skin: Skin is warm and dry. No rash noted.   Cardiovascular: Normal heart rate noted  Respiratory: Normal respiratory effort, no problems with respiration noted  Abdomen: Soft, gravid, appropriate for gestational age. Pain/Pressure: Absent     Pelvic:  Cervical exam deferred        Extremities: Normal range of motion.     Mental Status: Normal mood and affect. Normal behavior. Normal judgment and thought content.   Assessment   22 y.o.  G1P0 at [redacted]w[redacted]d by  11/21/2017, by Last Menstrual Period presenting for routine prenatal visit  Plan   pregnancy Problems (from 04/14/17 to present)    Problem Noted Resolved   Left ovarian cyst 05/20/2017 by Will Bonnet, MD No   Supervision of normal first pregnancy, antepartum 04/14/2017 by Rod Can, CNM No   Overview Addendum 09/04/2017  5:14 PM by Malachy Mood, [place] Prenatal Labs  Dating L=8 Blood type: B/Positive/-- (09/24 1426)   Genetic Screen 1 Screen: [X]  10/30 negative Antibody:Negative (09/24 1426)  Anatomic [place] Normal female, bilateral dermoids left 8.80 x .8.6cm Rubella: 1.08 (09/24 1426) Varicella:    GTT 82 RPR: Non Reactive (09/24 1426)   Rhogam N/A HBsAg: Negative (09/24 1426)   TDaP vaccine                        Flu Shot: DECLINES HIV:   Negative  Baby Food                                GBS:   Contraception  Pap: NIL HPV positive  CBB     CS/VBAC    Support Person          Nausea and vomiting during pregnancy 05/12/2017 by Will Bonnet, MD 08/26/2017 by Malachy Mood, MD       Term labor symptoms and general obstetric precautions including but not limited to vaginal bleeding, contractions, leaking of fluid and fetal movement were reviewed in detail with the patient. Please refer to After Visit Summary for other counseling recommendations.   Return in about 1 week (around 11/10/2017) for Routine Prenatal Appointment.  Prentice Docker, MD, [facility], [facility] 11/03/2017 12:28 PM

## 2017-11-10 ENCOUNTER — Ambulatory Visit (INDEPENDENT_AMBULATORY_CARE_PROVIDER_SITE_OTHER): Payer: Medicaid Other | Admitting: Obstetrics and Gynecology

## 2017-11-10 ENCOUNTER — Encounter: Payer: Self-pay | Admitting: Obstetrics and Gynecology

## 2017-11-10 VITALS — BP 118/74 | Ht 65.0 in | Wt 138.0 lb

## 2017-11-10 DIAGNOSIS — Z3400 Encounter for supervision of normal first pregnancy, unspecified trimester: Secondary | ICD-10-CM

## 2017-11-10 DIAGNOSIS — D279 Benign neoplasm of unspecified ovary: Secondary | ICD-10-CM

## 2017-11-10 DIAGNOSIS — O3480 Maternal care for other abnormalities of pelvic organs, unspecified trimester: Secondary | ICD-10-CM

## 2017-11-10 DIAGNOSIS — Z3A38 38 weeks gestation of pregnancy: Secondary | ICD-10-CM

## 2017-11-10 NOTE — Progress Notes (Signed)
OB History & Physical   History of Present Illness:  Chief Complaint: here for cesarean section  HPI:  Tiffany Guzman is a 22 y.o. G1P0 female at [redacted]w[redacted]d dated by LMP consistent with 8 week ultrasound.  Her pregnancy has been complicated by a left ovarian cyst (teratoma) requiring third trimester surgery to remove.    She denies contractions.   She denies leakage of fluid.   She denies vaginal bleeding.   She reports fetal movement.    Maternal Medical History:   Past Medical History:  Diagnosis Date  . Attention deficit hyperactivity disorder (ADHD)   . Chronic upper back pain   . GERD (gastroesophageal reflux disease)   . Migraine with aura    since elementary school    Past Surgical History:  Procedure Laterality Date  . INSERTION OF NON VAGINAL CONTRACEPTIVE DEVICE    . OOPHORECTOMY Left 10/03/2017   Procedure: OOPHORECTOMY;  Surgeon: Homero Fellers, MD;  Location: [facility] ORS;  Service: Gynecology;  Laterality: Left;  . OVARIAN CYST REMOVAL Left 10/03/2017   Procedure: OVARIAN CYSTECTOMY;  Surgeon: Homero Fellers, MD;  Location: [facility] ORS;  Service: Gynecology;  Laterality: Left;    No Known Allergies  Medications: denies    OB History  Gravida Para Term Preterm AB Living  1            SAB TAB Ectopic Multiple Live Births               # Outcome Date GA Lbr Len/2nd Weight Sex Delivery Anes PTL Lv  1 Current             Prenatal care site: [facility]  Social History: She  reports that she has never smoked. She has never used smokeless tobacco. She reports that she has current or past drug history. Drug: Marijuana. She reports that she does not drink alcohol.  Family History: family history includes Depression in her mother; Diabetes in her maternal grandmother; Hypertension in her mother; Hypothyroidism in her mother.   Review of Systems: Negative x 10 systems reviewed except as noted in the HPI.    Physical Exam:  Vital Signs: BP 118/74    Ht 5\' 5"  (1.651 m)   Wt 138 lb (62.6 kg)   LMP 02/14/2017   BMI 22.96 kg/m  Constitutional: Well nourished, well developed female in no acute distress.  HEENT: normal Skin: Warm and dry.  Cardiovascular: Regular rate and rhythm.   Extremity: no edema  Respiratory: Clear to auscultation bilateral. Normal respiratory effort Abdomen: FHT present and gravid, NT Back: no CVAT Neuro: DTRs 2+, Cranial nerves grossly intact Psych: Alert and Oriented x3. No memory deficits. Normal mood and affect.  MS: normal gait, normal bilateral lower extremity ROM/strength/stability. FHR: 145 bpm   Pertinent Results:  Prenatal Labs: Blood type/Rh B positive  Antibody screen negative  Rubella Immune  Varicella Immune    RPR NR  HBsAg negative  HIV negative  GC negative  Chlamydia negative  Genetic screening Negative 1st trimester screen  1 hour GTT 85  3 hour GTT n/a  GBS negative on 10/27/17    Assessment:  Tiffany Guzman is a 22 y.o. G1P0 female at [redacted]w[redacted]d with elective primary cesarean section due to recent abdominal surgery.  She also has had a noted right ovarian dermoid. We added possibly removing this at the time of the surgery. I will assess intraoperatively and remove, if feasible.   Plan:  1. Admit to Labor &  Delivery  2. CBC, T&S, NPO, IVF 3. GBS negative.   4. To OR for cesarean delivery   Prentice Docker, MD 11/10/2017 1:38 PM

## 2017-11-10 NOTE — H&P (View-Only) (Signed)
OB History & Physical   History of Present Illness:  Chief Complaint: here for cesarean section  HPI:  Tiffany Guzman is a 22 y.o. G1P0 female at [redacted]w[redacted]d dated by LMP consistent with 8 week ultrasound.  Her pregnancy has been complicated by a left ovarian cyst (teratoma) requiring third trimester surgery to remove.    She denies contractions.   She denies leakage of fluid.   She denies vaginal bleeding.   She reports fetal movement.    Maternal Medical History:   Past Medical History:  Diagnosis Date  . Attention deficit hyperactivity disorder (ADHD)   . Chronic upper back pain   . GERD (gastroesophageal reflux disease)   . Migraine with aura    since elementary school    Past Surgical History:  Procedure Laterality Date  . INSERTION OF NON VAGINAL CONTRACEPTIVE DEVICE    . OOPHORECTOMY Left 10/03/2017   Procedure: OOPHORECTOMY;  Surgeon: Homero Fellers, MD;  Location: [facility] ORS;  Service: Gynecology;  Laterality: Left;  . OVARIAN CYST REMOVAL Left 10/03/2017   Procedure: OVARIAN CYSTECTOMY;  Surgeon: Homero Fellers, MD;  Location: [facility] ORS;  Service: Gynecology;  Laterality: Left;    No Known Allergies  Medications: denies    OB History  Gravida Para Term Preterm AB Living  1            SAB TAB Ectopic Multiple Live Births               # Outcome Date GA Lbr Len/2nd Weight Sex Delivery Anes PTL Lv  1 Current             Prenatal care site: [facility]  Social History: She  reports that she has never smoked. She has never used smokeless tobacco. She reports that she has current or past drug history. Drug: Marijuana. She reports that she does not drink alcohol.  Family History: family history includes Depression in her mother; Diabetes in her maternal grandmother; Hypertension in her mother; Hypothyroidism in her mother.   Review of Systems: Negative x 10 systems reviewed except as noted in the HPI.    Physical Exam:  Vital Signs: BP 118/74    Ht 5\' 5"  (1.651 m)   Wt 138 lb (62.6 kg)   LMP 02/14/2017   BMI 22.96 kg/m  Constitutional: Well nourished, well developed female in no acute distress.  HEENT: normal Skin: Warm and dry.  Cardiovascular: Regular rate and rhythm.   Extremity: no edema  Respiratory: Clear to auscultation bilateral. Normal respiratory effort Abdomen: FHT present and gravid, NT Back: no CVAT Neuro: DTRs 2+, Cranial nerves grossly intact Psych: Alert and Oriented x3. No memory deficits. Normal mood and affect.  MS: normal gait, normal bilateral lower extremity ROM/strength/stability. FHR: 145 bpm   Pertinent Results:  Prenatal Labs: Blood type/Rh B positive  Antibody screen negative  Rubella Immune  Varicella Immune    RPR NR  HBsAg negative  HIV negative  GC negative  Chlamydia negative  Genetic screening Negative 1st trimester screen  1 hour GTT 85  3 hour GTT n/a  GBS negative on 10/27/17    Assessment:  Tiffany Guzman is a 22 y.o. G1P0 female at [redacted]w[redacted]d with elective primary cesarean section due to recent abdominal surgery.  She also has had a noted right ovarian dermoid. We added possibly removing this at the time of the surgery. I will assess intraoperatively and remove, if feasible.   Plan:  1. Admit to Labor &  Delivery  2. CBC, T&S, NPO, IVF 3. GBS negative.   4. To OR for cesarean delivery   Prentice Docker, MD 11/10/2017 1:38 PM

## 2017-11-12 ENCOUNTER — Encounter: Payer: Medicaid Other | Admitting: Obstetrics and Gynecology

## 2017-11-13 ENCOUNTER — Encounter
Admission: RE | Admit: 2017-11-13 | Discharge: 2017-11-13 | Disposition: A | Discharge status: Home / Self Care | Payer: Medicaid Other | Source: Ambulatory Visit | Attending: Obstetrics and Gynecology | Admitting: Obstetrics and Gynecology

## 2017-11-13 ENCOUNTER — Other Ambulatory Visit: Payer: Self-pay

## 2017-11-13 HISTORY — DX: Anxiety disorder, unspecified: F41.9

## 2017-11-13 HISTORY — DX: Depression, unspecified: F32.A

## 2017-11-13 HISTORY — DX: Major depressive disorder, single episode, unspecified: F32.9

## 2017-11-13 LAB — TYPE AND SCREEN
ABO/RH(D): B POS
Antibody Screen: NEGATIVE
Extend sample reason: UNDETERMINED

## 2017-11-13 LAB — CBC
HEMATOCRIT: 37.9 % (ref 35.0–47.0)
Hemoglobin: 12.7 g/dL (ref 12.0–16.0)
MCH: 28.3 pg (ref 26.0–34.0)
MCHC: 33.6 g/dL (ref 32.0–36.0)
MCV: 84.3 fL (ref 80.0–100.0)
PLATELETS: 236 10*3/uL (ref 150–440)
RBC: 4.5 MIL/uL (ref 3.80–5.20)
RDW: 14.6 % — AB (ref 11.5–14.5)
WBC: 10.7 10*3/uL (ref 3.6–11.0)

## 2017-11-13 MED ORDER — CEFAZOLIN SODIUM-DEXTROSE 2-4 GM/100ML-% IV SOLN
2.0000 g | INTRAVENOUS | Status: AC
  Administered 2017-11-14: 2 g via INTRAVENOUS
  Filled 2017-11-13: qty 100

## 2017-11-13 NOTE — Patient Instructions (Signed)
Your procedure is scheduled on: 11/14/17 Fri @ 5:30 am Come in through the emergency room   Remember: Instructions that are not followed completely may result in serious medical risk, up to and including death, or upon the discretion of your surgeon and anesthesiologist your surgery may need to be rescheduled.    _x___ 1. Do not eat food after midnight the night before your procedure. You may drink clear liquids up to 2 hours before you are scheduled to arrive at the hospital for your procedure.  Do not drink clear liquids within 2 hours of your scheduled arrival to the hospital.  Clear liquids include  --Water or Apple juice without pulp  --Clear carbohydrate beverage such as ClearFast or Gatorade  --Black Coffee or Clear Tea (No milk, no creamers, do not add anything to                  the coffee or Tea Type 1 and type 2 diabetics should only drink water.  No gum chewing or hard candies.     __x__ 2. No Alcohol for 24 hours before or after surgery.   __x__3. No Smoking or e-cigarettes for 24 prior to surgery.  Do not use any chewable tobacco products for at least 6 hour prior to surgery   ____  4. Bring all medications with you on the day of surgery if instructed.    __x__ 5. Notify your doctor if there is any change in your medical condition     (cold, fever, infections).    x___6. On the morning of surgery brush your teeth with toothpaste and water.  You may rinse your mouth with mouth wash if you wish.  Do not swallow any toothpaste or mouthwash.   Do not wear jewelry, make-up, hairpins, clips or nail polish.  Do not wear lotions, powders, or perfumes. You may wear deodorant.  Do not shave 48 hours prior to surgery. Men may shave face and neck.  Do not bring valuables to the hospital.    [facility] is not responsible for any belongings or valuables.               Contacts, dentures or bridgework may not be worn into surgery.  Leave your suitcase in the car. After surgery it  may be brought to your room.  For patients admitted to the hospital, discharge time is determined by your                       treatment team.  _  Patients discharged the day of surgery will not be allowed to drive home.  You will need someone to drive you home and stay with you the night of your procedure.    Please read over the following fact sheets that you were given:   [facility] Preparing for Surgery and or MRSA Information   _x___ Take anti-hypertensive listed below, cardiac, seizure, asthma,     anti-reflux and psychiatric medicines. These include:  1. None  2.  3.  4.  5.  6.  ____Fleets enema or Magnesium Citrate as directed.   _x___ Use CHG Soap or sage wipes as directed on instruction sheet   ____ Use inhalers on the day of surgery and bring to hospital day of surgery  ____ Stop Metformin and Janumet 2 days prior to surgery.    ____ Take 1/2 of usual insulin dose the night before surgery and none on the morning  surgery.   _x___ Follow recommendations from Cardiologist, Pulmonologist or PCP regarding          stopping Aspirin, Coumadin, Plavix ,Eliquis, Effient, or Pradaxa, and Pletal.  X____Stop Anti-inflammatories such as Advil, Aleve, Ibuprofen, Motrin, Naproxen, Naprosyn, Goodies powders or aspirin products. OK to take Tylenol and                          Celebrex.   _x___ Stop supplements until after surgery.  But may continue Vitamin D, Vitamin B,       and multivitamin.   ____ Bring C-Pap to the hospital.

## 2017-11-14 ENCOUNTER — Other Ambulatory Visit: Payer: Self-pay

## 2017-11-14 ENCOUNTER — Inpatient Hospital Stay: Payer: Medicaid Other | Admitting: Anesthesiology

## 2017-11-14 ENCOUNTER — Encounter
Admission: RE | Disposition: A | Discharge status: Home / Self Care | Payer: Self-pay | Source: Ambulatory Visit | Attending: Obstetrics and Gynecology

## 2017-11-14 ENCOUNTER — Inpatient Hospital Stay
Admission: RE | Admit: 2017-11-14 | Discharge: 2017-11-18 | DRG: 787 | Disposition: A | Discharge status: Home / Self Care | Payer: Medicaid Other | Source: Ambulatory Visit | Attending: Obstetrics and Gynecology | Admitting: Obstetrics and Gynecology

## 2017-11-14 DIAGNOSIS — O139 Gestational [pregnancy-induced] hypertension without significant proteinuria, unspecified trimester: Secondary | ICD-10-CM | POA: Insufficient documentation

## 2017-11-14 DIAGNOSIS — Z98891 History of uterine scar from previous surgery: Secondary | ICD-10-CM

## 2017-11-14 DIAGNOSIS — Z3A38 38 weeks gestation of pregnancy: Secondary | ICD-10-CM

## 2017-11-14 DIAGNOSIS — O26893 Other specified pregnancy related conditions, third trimester: Secondary | ICD-10-CM | POA: Diagnosis present

## 2017-11-14 DIAGNOSIS — O1414 Severe pre-eclampsia complicating childbirth: Secondary | ICD-10-CM | POA: Diagnosis present

## 2017-11-14 DIAGNOSIS — D62 Acute posthemorrhagic anemia: Secondary | ICD-10-CM | POA: Diagnosis not present

## 2017-11-14 DIAGNOSIS — O9081 Anemia of the puerperium: Secondary | ICD-10-CM | POA: Diagnosis not present

## 2017-11-14 DIAGNOSIS — Z3400 Encounter for supervision of normal first pregnancy, unspecified trimester: Secondary | ICD-10-CM

## 2017-11-14 DIAGNOSIS — M25511 Pain in right shoulder: Secondary | ICD-10-CM | POA: Diagnosis not present

## 2017-11-14 DIAGNOSIS — O1413 Severe pre-eclampsia, third trimester: Secondary | ICD-10-CM | POA: Diagnosis present

## 2017-11-14 DIAGNOSIS — O34219 Maternal care for unspecified type scar from previous cesarean delivery: Secondary | ICD-10-CM

## 2017-11-14 DIAGNOSIS — Z3A34 34 weeks gestation of pregnancy: Secondary | ICD-10-CM | POA: Diagnosis not present

## 2017-11-14 HISTORY — DX: Gestational (pregnancy-induced) hypertension without significant proteinuria, unspecified trimester: O13.9

## 2017-11-14 LAB — COMPREHENSIVE METABOLIC PANEL
ALBUMIN: 3.5 g/dL (ref 3.5–5.0)
ALT: 8 U/L — ABNORMAL LOW (ref 14–54)
ANION GAP: 7 (ref 5–15)
AST: 20 U/L (ref 15–41)
Alkaline Phosphatase: 180 U/L — ABNORMAL HIGH (ref 38–126)
BILIRUBIN TOTAL: 0.5 mg/dL (ref 0.3–1.2)
BUN: 8 mg/dL (ref 6–20)
CHLORIDE: 106 mmol/L (ref 101–111)
CO2: 21 mmol/L — ABNORMAL LOW (ref 22–32)
Calcium: 8.7 mg/dL — ABNORMAL LOW (ref 8.9–10.3)
Creatinine, Ser: 0.64 mg/dL (ref 0.44–1.00)
GFR calc Af Amer: >60 mL/min (ref >60)
GLUCOSE: 79 mg/dL (ref 65–99)
POTASSIUM: 4.1 mmol/L (ref 3.5–5.1)
Sodium: 134 mmol/L — ABNORMAL LOW (ref 135–145)
TOTAL PROTEIN: 7.2 g/dL (ref 6.5–8.1)

## 2017-11-14 LAB — CBC
HCT: 38 % (ref 35.0–47.0)
HEMOGLOBIN: 13 g/dL (ref 12.0–16.0)
MCH: 28.4 pg (ref 26.0–34.0)
MCHC: 34.1 g/dL (ref 32.0–36.0)
MCV: 83.2 fL (ref 80.0–100.0)
Platelets: 232 10*3/uL (ref 150–440)
RBC: 4.57 MIL/uL (ref 3.80–5.20)
RDW: 15 % — ABNORMAL HIGH (ref 11.5–14.5)
WBC: 12.1 10*3/uL — AB (ref 3.6–11.0)

## 2017-11-14 LAB — URINE DRUG SCREEN, QUALITATIVE (ARMC ONLY)
AMPHETAMINES, UR SCREEN: NOT DETECTED
BENZODIAZEPINE, UR SCRN: NOT DETECTED
Barbiturates, Ur Screen: NOT DETECTED
Cannabinoid 50 Ng, Ur ~~LOC~~: POSITIVE — AB
Cocaine Metabolite,Ur ~~LOC~~: NOT DETECTED
MDMA (ECSTASY) UR SCREEN: NOT DETECTED
METHADONE SCREEN, URINE: NOT DETECTED
Opiate, Ur Screen: POSITIVE — AB
Phencyclidine (PCP) Ur S: NOT DETECTED
Tricyclic, Ur Screen: NOT DETECTED

## 2017-11-14 LAB — RPR: RPR: NONREACTIVE

## 2017-11-14 LAB — PROTEIN / CREATININE RATIO, URINE
Creatinine, Urine: 20 mg/dL
PROTEIN CREATININE RATIO: 0.4 mg/mg{creat} — AB (ref 0.00–0.15)
TOTAL PROTEIN, URINE: 8 mg/dL

## 2017-11-14 SURGERY — Surgical Case
Anesthesia: Spinal

## 2017-11-14 MED ORDER — DIPHENHYDRAMINE HCL 50 MG/ML IJ SOLN: 12.5000 mg | INTRAMUSCULAR | Status: DC | PRN

## 2017-11-14 MED ORDER — MEPERIDINE HCL 25 MG/ML IJ SOLN: 6.2500 mg | INTRAMUSCULAR | Status: DC | PRN

## 2017-11-14 MED ORDER — SODIUM CHLORIDE 0.9 % IJ SOLN
INTRAMUSCULAR | Status: AC
  Filled 2017-11-14: qty 50

## 2017-11-14 MED ORDER — OXYCODONE-ACETAMINOPHEN 5-325 MG PO TABS
2.0000 | ORAL_TABLET | ORAL | Status: DC | PRN
  Administered 2017-11-15 – 2017-11-16 (×4): 2 via ORAL
  Filled 2017-11-14 (×5): qty 2

## 2017-11-14 MED ORDER — OXYTOCIN 40 UNITS IN LACTATED RINGERS INFUSION - SIMPLE MED
INTRAVENOUS | Status: DC | PRN
  Administered 2017-11-14: 600 mL via INTRAVENOUS
  Administered 2017-11-14 (×2): 100 mL via INTRAVENOUS

## 2017-11-14 MED ORDER — IBUPROFEN 600 MG PO TABS
600.0000 mg | ORAL_TABLET | Freq: Four times a day (QID) | ORAL | Status: DC
  Administered 2017-11-15: 600 mg via ORAL
  Filled 2017-11-14: qty 1

## 2017-11-14 MED ORDER — NALBUPHINE HCL 10 MG/ML IJ SOLN: 5.0000 mg | Freq: Once | INTRAMUSCULAR | Status: DC | PRN

## 2017-11-14 MED ORDER — OXYCODONE HCL 5 MG PO TABS
5.0000 mg | ORAL_TABLET | Freq: Four times a day (QID) | ORAL | Status: DC | PRN
Start: 2017-11-14 — End: 2017-11-14

## 2017-11-14 MED ORDER — OXYCODONE HCL 5 MG PO TABS
5.0000 mg | ORAL_TABLET | Freq: Once | ORAL | Status: AC | PRN
Start: 2017-11-14 — End: 2017-11-14
  Administered 2017-11-14: 5 mg via ORAL
  Filled 2017-11-14: qty 1

## 2017-11-14 MED ORDER — MORPHINE SULFATE (PF) 0.5 MG/ML IJ SOLN
INTRAMUSCULAR | Status: AC
  Filled 2017-11-14: qty 10

## 2017-11-14 MED ORDER — ACETAMINOPHEN 325 MG PO TABS
650.0000 mg | ORAL_TABLET | Freq: Four times a day (QID) | ORAL | Status: AC
  Administered 2017-11-14 – 2017-11-15 (×2): 650 mg via ORAL
  Filled 2017-11-14 (×2): qty 2

## 2017-11-14 MED ORDER — NALBUPHINE HCL 10 MG/ML IJ SOLN: 5.0000 mg | INTRAMUSCULAR | Status: DC | PRN

## 2017-11-14 MED ORDER — DIPHENHYDRAMINE HCL 25 MG PO CAPS: 25.0000 mg | ORAL_CAPSULE | Freq: Four times a day (QID) | ORAL | Status: DC | PRN

## 2017-11-14 MED ORDER — DIPHENHYDRAMINE HCL 25 MG PO CAPS: 25.0000 mg | ORAL_CAPSULE | ORAL | Status: DC | PRN

## 2017-11-14 MED ORDER — LACTATED RINGERS IV SOLN
INTRAVENOUS | Status: DC
  Administered 2017-11-14: 07:00:00 via INTRAVENOUS

## 2017-11-14 MED ORDER — FENTANYL CITRATE (PF) 100 MCG/2ML IJ SOLN
25.0000 ug | INTRAMUSCULAR | Status: DC | PRN
  Administered 2017-11-14: 50 ug via INTRAVENOUS
  Filled 2017-11-14: qty 2

## 2017-11-14 MED ORDER — BUPIVACAINE HCL 0.5 % IJ SOLN
INTRAMUSCULAR | Status: DC | PRN
  Administered 2017-11-14: 10 mL

## 2017-11-14 MED ORDER — OXYTOCIN 40 UNITS IN LACTATED RINGERS INFUSION - SIMPLE MED
INTRAVENOUS | Status: AC
  Filled 2017-11-14: qty 1000

## 2017-11-14 MED ORDER — SENNOSIDES-DOCUSATE SODIUM 8.6-50 MG PO TABS
2.0000 | ORAL_TABLET | ORAL | Status: DC
  Administered 2017-11-15 – 2017-11-18 (×4): 2 via ORAL
  Filled 2017-11-14 (×4): qty 2

## 2017-11-14 MED ORDER — BUPIVACAINE IN DEXTROSE 0.75-8.25 % IT SOLN
INTRATHECAL | Status: DC | PRN
  Administered 2017-11-14: 1.6 mL via INTRATHECAL

## 2017-11-14 MED ORDER — HYDRALAZINE HCL 20 MG/ML IJ SOLN
10.0000 mg | Freq: Once | INTRAMUSCULAR | Status: AC | PRN
  Administered 2017-11-14: 10 mg via INTRAVENOUS
  Filled 2017-11-14: qty 1

## 2017-11-14 MED ORDER — LABETALOL HCL 5 MG/ML IV SOLN
INTRAVENOUS | Status: AC
  Filled 2017-11-14: qty 4

## 2017-11-14 MED ORDER — SODIUM CHLORIDE 0.9% FLUSH: 3.0000 mL | INTRAVENOUS | Status: DC | PRN

## 2017-11-14 MED ORDER — BUPIVACAINE HCL (PF) 0.5 % IJ SOLN
5.0000 mL | Freq: Once | INTRAMUSCULAR | Status: DC
  Filled 2017-11-14: qty 30

## 2017-11-14 MED ORDER — ONDANSETRON HCL 4 MG/2ML IJ SOLN
INTRAMUSCULAR | Status: AC
  Filled 2017-11-14: qty 2

## 2017-11-14 MED ORDER — COCONUT OIL OIL: 1.0000 "application " | TOPICAL_OIL | Status: DC | PRN

## 2017-11-14 MED ORDER — LABETALOL HCL 5 MG/ML IV SOLN: 20.0000 mg | INTRAVENOUS | Status: DC | PRN

## 2017-11-14 MED ORDER — LABETALOL HCL 5 MG/ML IV SOLN
20.0000 mg | INTRAVENOUS | Status: DC | PRN
  Administered 2017-11-14: 20 mg via INTRAVENOUS
  Administered 2017-11-14: 40 mg via INTRAVENOUS
  Filled 2017-11-14: qty 4
  Filled 2017-11-14: qty 16
  Filled 2017-11-14: qty 4
  Filled 2017-11-14: qty 8

## 2017-11-14 MED ORDER — OXYCODONE-ACETAMINOPHEN 5-325 MG PO TABS: 2.0000 | ORAL_TABLET | ORAL | Status: DC | PRN

## 2017-11-14 MED ORDER — WITCH HAZEL-GLYCERIN EX PADS: 1.0000 "application " | MEDICATED_PAD | CUTANEOUS | Status: DC | PRN

## 2017-11-14 MED ORDER — OXYCODONE HCL 5 MG/5ML PO SOLN: 5.0000 mg | Freq: Once | ORAL | Status: AC | PRN

## 2017-11-14 MED ORDER — MAGNESIUM SULFATE 40 G IN LACTATED RINGERS - SIMPLE
2.0000 g/h | INTRAVENOUS | Status: DC
  Administered 2017-11-15: 2 g/h via INTRAVENOUS
  Filled 2017-11-14 (×2): qty 500
  Filled 2017-11-14: qty 1000

## 2017-11-14 MED ORDER — MORPHINE SULFATE (PF) 0.5 MG/ML IJ SOLN
INTRAMUSCULAR | Status: DC | PRN
  Administered 2017-11-14: 100 ug via EPIDURAL
  Administered 2017-11-14: 2000 ug via INTRAVENOUS

## 2017-11-14 MED ORDER — KETOROLAC TROMETHAMINE 30 MG/ML IJ SOLN: 30.0000 mg | Freq: Four times a day (QID) | INTRAMUSCULAR | Status: AC

## 2017-11-14 MED ORDER — MENTHOL 3 MG MT LOZG
1.0000 | LOZENGE | OROMUCOSAL | Status: DC | PRN
  Filled 2017-11-14: qty 9

## 2017-11-14 MED ORDER — NALOXONE HCL 0.4 MG/ML IJ SOLN: 0.4000 mg | INTRAMUSCULAR | Status: DC | PRN

## 2017-11-14 MED ORDER — OXYCODONE-ACETAMINOPHEN 5-325 MG PO TABS
1.0000 | ORAL_TABLET | ORAL | Status: DC | PRN
  Administered 2017-11-15 – 2017-11-17 (×8): 1 via ORAL
  Filled 2017-11-14 (×7): qty 1

## 2017-11-14 MED ORDER — ONDANSETRON HCL 4 MG/2ML IJ SOLN
INTRAMUSCULAR | Status: DC | PRN
  Administered 2017-11-14: 4 mg via INTRAVENOUS

## 2017-11-14 MED ORDER — LABETALOL HCL 5 MG/ML IV SOLN
INTRAVENOUS | Status: DC | PRN
  Administered 2017-11-14 (×2): 5 mg via INTRAVENOUS

## 2017-11-14 MED ORDER — LACTATED RINGERS IV SOLN
INTRAVENOUS | Status: DC
  Administered 2017-11-14: 14:00:00 via INTRAVENOUS

## 2017-11-14 MED ORDER — PRENATAL MULTIVITAMIN CH
1.0000 | ORAL_TABLET | Freq: Every day | ORAL | Status: DC
  Administered 2017-11-16 – 2017-11-17 (×2): 1 via ORAL
  Filled 2017-11-14 (×2): qty 1

## 2017-11-14 MED ORDER — LABETALOL HCL 5 MG/ML IV SOLN
20.0000 mg | INTRAVENOUS | Status: DC | PRN
  Administered 2017-11-14: 80 mg via INTRAVENOUS
  Filled 2017-11-14: qty 20
  Filled 2017-11-14: qty 16

## 2017-11-14 MED ORDER — OXYCODONE-ACETAMINOPHEN 5-325 MG PO TABS: 1.0000 | ORAL_TABLET | ORAL | Status: DC | PRN

## 2017-11-14 MED ORDER — FENTANYL CITRATE (PF) 100 MCG/2ML IJ SOLN
INTRAMUSCULAR | Status: DC | PRN
  Administered 2017-11-14: 35 ug via INTRAVENOUS
  Administered 2017-11-14: 50 ug via INTRAVENOUS
  Administered 2017-11-14: 15 ug via EPIDURAL

## 2017-11-14 MED ORDER — OXYTOCIN 40 UNITS IN LACTATED RINGERS INFUSION - SIMPLE MED: 2.5000 [IU]/h | INTRAVENOUS | Status: DC

## 2017-11-14 MED ORDER — ONDANSETRON HCL 4 MG/2ML IJ SOLN
4.0000 mg | Freq: Three times a day (TID) | INTRAMUSCULAR | Status: DC | PRN
  Administered 2017-11-15 – 2017-11-17 (×4): 4 mg via INTRAVENOUS
  Filled 2017-11-14 (×4): qty 2

## 2017-11-14 MED ORDER — BUPIVACAINE 0.25 % ON-Q PUMP DUAL CATH 400 ML
400.0000 mL | INJECTION | Status: DC
  Filled 2017-11-14 (×2): qty 400

## 2017-11-14 MED ORDER — LACTATED RINGERS IV SOLN: INTRAVENOUS | Status: DC

## 2017-11-14 MED ORDER — KETOROLAC TROMETHAMINE 30 MG/ML IJ SOLN
30.0000 mg | Freq: Four times a day (QID) | INTRAMUSCULAR | Status: AC
  Administered 2017-11-14 – 2017-11-15 (×3): 30 mg via INTRAVENOUS
  Filled 2017-11-14 (×3): qty 1

## 2017-11-14 MED ORDER — DIBUCAINE 1 % RE OINT: 1.0000 "application " | TOPICAL_OINTMENT | RECTAL | Status: DC | PRN

## 2017-11-14 MED ORDER — SIMETHICONE 80 MG PO CHEW
80.0000 mg | CHEWABLE_TABLET | Freq: Three times a day (TID) | ORAL | Status: DC
  Administered 2017-11-14 – 2017-11-18 (×12): 80 mg via ORAL
  Filled 2017-11-14 (×12): qty 1

## 2017-11-14 MED ORDER — MAGNESIUM SULFATE 40 G IN LACTATED RINGERS - SIMPLE: 2.0000 g/h | INTRAVENOUS | Status: DC

## 2017-11-14 MED ORDER — MAGNESIUM SULFATE BOLUS VIA INFUSION
4.0000 g | Freq: Once | INTRAVENOUS | Status: AC
  Administered 2017-11-14: 4 g via INTRAVENOUS
  Filled 2017-11-14 (×3): qty 500

## 2017-11-14 MED ORDER — BUPIVACAINE HCL (PF) 0.5 % IJ SOLN: 5.0000 mL | Freq: Once | INTRAMUSCULAR | Status: DC

## 2017-11-14 MED ORDER — FERROUS SULFATE 325 (65 FE) MG PO TABS
325.0000 mg | ORAL_TABLET | Freq: Two times a day (BID) | ORAL | Status: DC
  Administered 2017-11-15 (×2): 325 mg via ORAL
  Filled 2017-11-14 (×2): qty 1

## 2017-11-14 MED ORDER — SOD CITRATE-CITRIC ACID 500-334 MG/5ML PO SOLN
30.0000 mL | ORAL | Status: AC
  Administered 2017-11-14: 30 mL via ORAL
  Filled 2017-11-14: qty 15

## 2017-11-14 MED ORDER — FENTANYL CITRATE (PF) 100 MCG/2ML IJ SOLN
INTRAMUSCULAR | Status: AC
  Filled 2017-11-14: qty 2

## 2017-11-14 MED ORDER — ONDANSETRON HCL 4 MG/2ML IJ SOLN
4.0000 mg | Freq: Once | INTRAMUSCULAR | Status: AC
  Administered 2017-11-14: 4 mg via INTRAVENOUS

## 2017-11-14 SURGICAL SUPPLY — 33 items
CANISTER SUCT 3000ML PPV (MISCELLANEOUS) ×3 IMPLANT
CATH KIT ON-Q SILVERSOAK 5IN (CATHETERS) ×6 IMPLANT
CLOSURE WOUND 1/2 X4 (GAUZE/BANDAGES/DRESSINGS) ×1
DERMABOND ADVANCED (GAUZE/BANDAGES/DRESSINGS) ×2
DERMABOND ADVANCED .7 DNX12 (GAUZE/BANDAGES/DRESSINGS) ×1 IMPLANT
DRSG OPSITE POSTOP 4X10 (GAUZE/BANDAGES/DRESSINGS) ×3 IMPLANT
DRSG TELFA 3X8 NADH (GAUZE/BANDAGES/DRESSINGS) ×3 IMPLANT
ELECT CAUTERY BLADE 6.4 (BLADE) ×3 IMPLANT
ELECT REM PT RETURN 9FT ADLT (ELECTROSURGICAL) ×3
ELECTRODE REM PT RTRN 9FT ADLT (ELECTROSURGICAL) ×1 IMPLANT
GAUZE SPONGE 4X4 12PLY STRL (GAUZE/BANDAGES/DRESSINGS) ×3 IMPLANT
GLOVE BIO SURGEON STRL SZ7 (GLOVE) ×12 IMPLANT
GLOVE INDICATOR 7.5 STRL GRN (GLOVE) ×12 IMPLANT
GOWN STRL REUS W/ TWL LRG LVL3 (GOWN DISPOSABLE) ×3 IMPLANT
GOWN STRL REUS W/TWL LRG LVL3 (GOWN DISPOSABLE) ×6
NS IRRIG 1000ML POUR BTL (IV SOLUTION) ×3 IMPLANT
PACK C SECTION AR (MISCELLANEOUS) ×3 IMPLANT
PAD OB MATERNITY 4.3X12.25 (PERSONAL CARE ITEMS) ×6 IMPLANT
PAD PREP 24X41 OB/GYN DISP (PERSONAL CARE ITEMS) ×3 IMPLANT
SPONGE LAP 18X18 5 PK (GAUZE/BANDAGES/DRESSINGS) ×6 IMPLANT
STRIP CLOSURE SKIN 1/2X4 (GAUZE/BANDAGES/DRESSINGS) ×2 IMPLANT
SUT CHROMIC GUT BROWN 0 54 (SUTURE) IMPLANT
SUT CHROMIC GUT BROWN 0 54IN (SUTURE)
SUT MNCRL 4-0 (SUTURE) ×2
SUT MNCRL 4-0 27XMFL (SUTURE) ×1
SUT PDS AB 1 TP1 96 (SUTURE) ×3 IMPLANT
SUT PLAIN GUT 0 (SUTURE) IMPLANT
SUT VIC AB 0 CTX 36 (SUTURE) ×4
SUT VIC AB 0 CTX36XBRD ANBCTRL (SUTURE) ×2 IMPLANT
SUT VIC AB 3-0 SH 27 (SUTURE) ×2
SUT VIC AB 3-0 SH 27X BRD (SUTURE) ×1 IMPLANT
SUTURE MNCRL 4-0 27XMF (SUTURE) ×1 IMPLANT
SWABSTK COMLB BENZOIN TINCTURE (MISCELLANEOUS) ×3 IMPLANT

## 2017-11-14 NOTE — Anesthesia Procedure Notes (Signed)
Date/Time: 11/14/2017 8:20 AM Performed by: Johnna Acosta, CRNA Pre-anesthesia Checklist: Patient identified, Emergency Drugs available, Suction available, Patient being monitored and Timeout performed Patient Re-evaluated:Patient Re-evaluated prior to induction Oxygen Delivery Method: Nasal cannula Preoxygenation: Pre-oxygenation with 100% oxygen

## 2017-11-14 NOTE — Transfer of Care (Signed)
Immediate Anesthesia Transfer of Care Note  Patient: Tiffany Guzman  Procedure(s) Performed: CESAREAN SECTION (N/A )  Patient Location: PACU  Anesthesia Type:Spinal  Level of Consciousness: awake, alert  and oriented  Airway & Oxygen Therapy: Patient Spontanous Breathing  Post-op Assessment: Report given to RN and Post -op Vital signs reviewed and stable  Post vital signs: Reviewed and stable  Last Vitals:  Vitals Value Taken Time  BP 144/100 11/14/2017 10:01 AM  Temp 36.4 C 11/14/2017 10:01 AM  Pulse 77 11/14/2017 10:01 AM  Resp 16 11/14/2017 10:01 AM  SpO2 100 % 11/14/2017 10:01 AM    Last Pain:  Vitals:   11/14/17 1001  TempSrc: Axillary  PainSc:          Complications: No apparent anesthesia complications

## 2017-11-14 NOTE — Anesthesia Preprocedure Evaluation (Addendum)
Anesthesia Evaluation  Patient identified by MRN, date of birth, ID band Patient awake    Reviewed: Allergy & Precautions, H&P , NPO status , Patient's Chart, lab work & pertinent test results  History of Anesthesia Complications Negative for: history of anesthetic complications  Airway Mallampati: II  TM Distance: >3 FB Neck ROM: full    Dental  (+) Chipped   Pulmonary neg pulmonary ROS, neg shortness of breath,           Cardiovascular Exercise Tolerance: Good hypertension,      Neuro/Psych  Headaches, PSYCHIATRIC DISORDERS Anxiety Depression    GI/Hepatic GERD  Medicated and Controlled,  Endo/Other    Renal/GU   negative genitourinary   Musculoskeletal   Abdominal   Peds  Hematology negative hematology ROS (+)   Anesthesia Other Findings Past Medical History: No date: Anxiety No date: Attention deficit hyperactivity disorder (ADHD) No date: Chronic upper back pain No date: Depression No date: GERD (gastroesophageal reflux disease) No date: Migraine with aura     Comment:  since elementary school  Past Surgical History: No date: INSERTION OF NON VAGINAL CONTRACEPTIVE DEVICE 10/03/2017: OOPHORECTOMY; Left     Comment:  Procedure: OOPHORECTOMY;  Surgeon: Homero Fellers, MD;  Location: [facility] ORS;  Service: Gynecology;                Laterality: Left; 10/03/2017: OVARIAN CYST REMOVAL; Left     Comment:  Procedure: OVARIAN CYSTECTOMY;  Surgeon: Homero Fellers, MD;  Location: [facility] ORS;  Service:               Gynecology;  Laterality: Left;  BMI    Body Mass Index:  22.96 kg/m      Reproductive/Obstetrics (+) Pregnancy                             Anesthesia Physical Anesthesia Plan  ASA: III  Anesthesia Plan: Spinal   Post-op Pain Management:    Induction:   PONV Risk Score and Plan:   Airway Management Planned: Natural Airway  and Nasal Cannula  Additional Equipment:   Intra-op Plan:   Post-operative Plan:   Informed Consent: I have reviewed the patients History and Physical, chart, labs and discussed the procedure including the risks, benefits and alternatives for the proposed anesthesia with the patient or authorized representative who has indicated his/her understanding and acceptance.   Dental Advisory Given  Plan Discussed with: Anesthesiologist, CRNA and Surgeon  Anesthesia Plan Comments: (Patient reports no bleeding problems and no anticoagulant use.  Plan for spinal with backup GA  Patient consented for risks of anesthesia including but not limited to:  - adverse reactions to medications - risk of bleeding, infection, nerve damage and headache - risk of failed spinal - damage to teeth, lips or other oral mucosa - sore throat or hoarseness - Damage to heart, brain, lungs or loss of life  Patient voiced understanding.)        Anesthesia Quick Evaluation

## 2017-11-14 NOTE — Interval H&P Note (Signed)
History and Physical Interval Note:  11/14/2017 7:32 AM  Tiffany Guzman  has presented today for surgery, with the diagnosis of elective pregnancy  The various methods of treatment have been discussed with the patient and family. After consideration of risks, benefits and other options for treatment, the patient has consented to  Procedure(s): CESAREAN SECTION (N/A) as a surgical intervention.  The patient's history has been reviewed, patient examined, no change in status, stable for surgery.  I have reviewed the patient's chart and labs.  Questions were answered to the patient's satisfaction.  We previously have discussed assessing the right ovary.  If there is an obvious dermoid that is amenable to removal, then I may attempt the removal of the cyst during the surgery. This is mainly due to the fact that she has only one ovary and she is 22 years old and preservation of that ovary is of high importance. However, with the significant increase in vascularity of the ovary during pregnancy, the risk of significant bleeding from the ovary after the removal of a cyst is also of concern. We discussed that I would do my best to make an assessment and balance out the risks.  Of note, the ultrasound that showed a possible lesion of the right ovary was by no means definitive that a teratoma existed in that ovary.   She also had elevated blood pressures upon arrival to L&D today.  She denies headache, visual changes, and ruq pain.    Prentice Docker, MD, [facility], [facility] 11/14/2017 7:34 AM  i

## 2017-11-14 NOTE — Anesthesia Post-op Follow-up Note (Signed)
Anesthesia QCDR form completed.        

## 2017-11-14 NOTE — Op Note (Signed)
Cesarean Section Operative Note    Tiffany Guzman   11/14/2017   Pre-operative Diagnosis:  1) intrauterine pregnancy at [redacted]w[redacted]d  2) elective primary cesarean section due to recent abdominal surgery.   Post-operative Diagnosis:  1) intrauterine pregnancy at [redacted]w[redacted]d  2) elective primary cesarean section due to recent abdominal surgery.    Procedure: Primary low transverse cesarean section via pfannenstiel incision with double-layer uterine closure  Surgeon: Surgeon(s) and Role:    Will Bonnet, MD - Primary   Assistants: Tressia Danas, RN  Anesthesia: spinal   Findings:  1) normal appearing gravid uterus, fallopian tubes, absent left ovary 2) right ovary with no apparent abnormal cystic structure 3) viable female infant with weight of 2,620 grams and APGARs 9 and 9   Estimated Blood Loss: 750 mL  Total IV Fluids: 2,000 ml crystalloid  Urine Output: 300 mL clear urine at end of procedure  Specimens: none  Complications: no complications  Disposition: PACU - hemodynamically stable.   Maternal Condition: stable   Baby condition / location:  Couplet care / Skin to Skin  Procedure Details:  The patient was seen in the Holding Room. The risks, benefits, complications, treatment options, and expected outcomes were discussed with the patient. The patient concurred with the proposed plan, giving informed consent. identified as Tiffany Guzman and the procedure verified as C-Section Delivery. A Time Out was held and the above information confirmed.   After induction of anesthesia, the patient was draped and prepped in the usual sterile manner. A Pfannenstiel incision was made and carried down through the subcutaneous tissue to the fascia. Fascial incision was made and extended transversely. The fascia was separated from the underlying rectus tissue superiorly and inferiorly. The peritoneum was identified and entered. Peritoneal incision was extended longitudinally.  The bladder flap was bluntly and sharply freed from the lower uterine segment. A low transverse uterine incision was made and the hysterotomy was extended with cranial-caudal tension. Delivered from cephalic presentation was a 2,620 gram Living newborn infant(s) or Female with Apgar scores of 9 at one minute and 9 at five minutes. Cord ph was not sent the umbilical cord was clamped and cut cord blood was not obtained for evaluation. The placenta was removed Intact and appeared normal. The uterine outline and tubes appeared normal. The left ovary was surgically absent. The right ovary was visualized with no apparent abnormalities. The ovary was palpated and a subcentimeter lesion was palpated that was ovoid in shape and was not firm. The uterine incision was closed with running locked sutures of 0 Vicryl.  A second layer of the same suture was thrown in an imbricating fashion.  Hemostasis was assured.  The uterus was returned to the abdomen and the paracolic gutters were cleared of all clots and debris.  The rectus muscles were inspected and found to be hemostatic.  The On-Q catheter pumps were inserted in accordance with the manufacturer's recommendations.  The catheters were inserted approximately 4cm cephelad to the incision line, approximately 1cm apart, straddling the midline.  They were inserted to a depth of the 4th mark. They were positioned superficial to the rectus abdominus muscles and deep to the rectus fascia.    The fascia was then reapproximated with running sutures of 1-0 PDS, looped. Three interrupted 3-0 vicryl stitches were thrown in the subcutaneous layer to reduce skin tension.  The subcuticular closure was performed using 4-0 monocryl. The skin closure was reinforced using benzoin and 1/2" steri-strips.  The  On-Q catheters were bolused with 5 mL of 0.5% marcaine plain for a total of 10 mL.  The catheters were affixed to the skin with surgical skin glue, steri-strips, and tegaderm.     Instrument, sponge, and needle counts were correct prior the abdominal closure and were correct at the conclusion of the case.  The patient received Ancef 2 gram IV prior to skin incision (within 30 minutes). For VTE prophylaxis she was wearing SCDs throughout the case.    Signed: Will Bonnet, MD 11/14/2017 9:48 AM

## 2017-11-14 NOTE — Progress Notes (Signed)
Dr Glennon Mac called regarding elevated blood pressures. Order received to continue to monitor over the next hour and if next BP elevated we will treat.

## 2017-11-15 DIAGNOSIS — O1413 Severe pre-eclampsia, third trimester: Secondary | ICD-10-CM | POA: Diagnosis present

## 2017-11-15 LAB — CBC
HCT: 33.6 % — ABNORMAL LOW (ref 35.0–47.0)
HEMOGLOBIN: 11.5 g/dL — AB (ref 12.0–16.0)
MCH: 28.6 pg (ref 26.0–34.0)
MCHC: 34.2 g/dL (ref 32.0–36.0)
MCV: 83.4 fL (ref 80.0–100.0)
Platelets: 212 10*3/uL (ref 150–440)
RBC: 4.03 MIL/uL (ref 3.80–5.20)
RDW: 15.1 % — AB (ref 11.5–14.5)
WBC: 11.3 10*3/uL — ABNORMAL HIGH (ref 3.6–11.0)

## 2017-11-15 MED ORDER — METAXALONE 800 MG PO TABS
800.0000 mg | ORAL_TABLET | Freq: Three times a day (TID) | ORAL | Status: DC | PRN
Start: 2017-11-15 — End: 2017-11-18
  Administered 2017-11-15 – 2017-11-17 (×4): 800 mg via ORAL
  Filled 2017-11-15 (×6): qty 1

## 2017-11-15 MED ORDER — IBUPROFEN 600 MG PO TABS
600.0000 mg | ORAL_TABLET | Freq: Four times a day (QID) | ORAL | Status: DC | PRN
  Administered 2017-11-15 – 2017-11-18 (×9): 600 mg via ORAL
  Filled 2017-11-15 (×9): qty 1

## 2017-11-15 MED ORDER — FAMOTIDINE 20 MG PO TABS
20.0000 mg | ORAL_TABLET | Freq: Two times a day (BID) | ORAL | Status: DC
  Administered 2017-11-15 – 2017-11-18 (×7): 20 mg via ORAL
  Filled 2017-11-15 (×7): qty 1

## 2017-11-15 MED ORDER — FERROUS SULFATE 325 (65 FE) MG PO TABS
325.0000 mg | ORAL_TABLET | Freq: Every day | ORAL | Status: DC
  Administered 2017-11-16 – 2017-11-18 (×3): 325 mg via ORAL
  Filled 2017-11-15 (×3): qty 1

## 2017-11-15 MED ORDER — NIFEDIPINE ER OSMOTIC RELEASE 30 MG PO TB24
30.0000 mg | ORAL_TABLET | ORAL | Status: AC
  Administered 2017-11-15: 30 mg via ORAL
  Filled 2017-11-15: qty 1

## 2017-11-15 MED ORDER — NIFEDIPINE ER OSMOTIC RELEASE 30 MG PO TB24
60.0000 mg | ORAL_TABLET | Freq: Every day | ORAL | Status: DC
  Administered 2017-11-16 – 2017-11-17 (×2): 60 mg via ORAL
  Filled 2017-11-15 (×2): qty 2

## 2017-11-15 MED ORDER — NIFEDIPINE ER OSMOTIC RELEASE 30 MG PO TB24
30.0000 mg | ORAL_TABLET | Freq: Every day | ORAL | Status: DC
  Administered 2017-11-15: 30 mg via ORAL
  Filled 2017-11-15: qty 1

## 2017-11-15 NOTE — Anesthesia Post-op Follow-up Note (Signed)
  Anesthesia Pain Follow-up Note  Patient: Tiffany Guzman  Day #: 1  Date of Follow-up: 11/15/2017 Time: 12:39 PM  Last Vitals:  Vitals:   11/15/17 0755 11/15/17 0800  BP:    Pulse:    Resp:    Temp:    SpO2: 98% 98%    Level of Consciousness: alert  Pain: mild   Side Effects:None  Catheter Site Exam:clean, dry, no drainage     Plan: D/C from anesthesia care at surgeon's request  Martha Clan

## 2017-11-15 NOTE — Discharge Summary (Signed)
OB Discharge Summary     Patient Name: Tiffany Guzman DOB: 1996/07/08 MRN: [id number]  Date of admission: 11/14/2017 Delivering MD: Prentice Docker, MD  Date of Delivery: 11/14/2017  Date of discharge: 11/18/2017  Admitting diagnosis: admission for cesarean delivery Intrauterine pregnancy: [redacted]w[redacted]d     Secondary diagnosis: Preeclampsia     Discharge diagnosis: Term Pregnancy Delivered and Preeclampsia (severe)                                                                                                Post partum procedures:magnsium sulfate administration for severe preeclampsia  Augmentation: n/a  Complications: None  Hospital course:  Sceduled C/S   22 y.o. yo G1P1001 at [redacted]w[redacted]d was admitted to the hospital 11/14/2017 for scheduled cesarean section with the following indication:Elective Primary. (major abdominal surgery 6 week prior with concern for healing of fascia).  Membrane Rupture Time/Date: 8:34 AM ,11/14/2017   Patient delivered a Viable infant.11/14/2017  Details of operation can be found in separate operative note.    Patient was admitted with severe-range blood pressures requiring IV antihypertensive medication.  She went on to have her cesarean delivery without issue. She was given magnesium sulfate therapy for 24 hours postpartum for seizure prophylaxis. Postpartum course significant for neck/shoulder pain, gas pain, elevated blood pressure.    She is ambulating, tolerating a regular diet, passing flatus, has had a bowel movement, and she is urinating well. She denies headache, visual changes, epigastric pain. On Q Pump discontinued prior to discharge. Postpartum instructions reviewed with patient including precautions, restrictions, self care.  Patient is discharged home in stable condition on 11/18/17 with Rx for antihypertensive and analgesia, and follow up appointment scheduled.         Physical exam  Vitals:   11/18/17 0412 11/18/17 0828 11/18/17 0900 11/18/17 0925   BP: 111/67 (!) 129/102 (!) 151/112 (!) 151/112  Pulse: 65 96    Resp: 18 18    Temp: 97.8 F (36.6 C) 97.6 F (36.4 C)    TempSrc: Oral Oral    SpO2: 99% 100%    Weight:      Height:       General: alert, cooperative and no distress Lochia: appropriate Uterine Fundus: firm Incision: Healing well with no significant drainage DVT Evaluation: No evidence of DVT seen on physical exam.  Labs: Lab Results  Component Value Date   WBC 11.3 (H) 11/15/2017   HGB 11.5 (L) 11/15/2017   HCT 33.6 (L) 11/15/2017   MCV 83.4 11/15/2017   PLT 212 11/15/2017    Discharge instruction: per After Visit Summary.  Medications:  Allergies as of 11/18/2017   No Known Allergies     Medication List    STOP taking these medications   docusate sodium 100 MG capsule Commonly known as:  COLACE   ondansetron 4 MG disintegrating tablet Commonly known as:  ZOFRAN ODT   oxyCODONE 5 MG immediate release tablet Commonly known as:  Oxy IR/ROXICODONE   promethazine 25 MG tablet Commonly known as:  PHENERGAN     TAKE these medications   NIFEdipine 30 MG 24 hr  tablet Commonly known as:  PROCARDIA-XL/ADALAT-CC/NIFEDICAL-XL Take 3 tablets (90 mg total) by mouth daily.   oxyCODONE-acetaminophen 5-325 MG tablet Commonly known as:  PERCOCET/ROXICET Take 1 tablet by mouth every 6 (six) hours as needed for up to 5 days for moderate pain (pain score 4-7/10).   PRENATAL VITAMIN PLUS LOW IRON 27-1 MG Tabs Take 1 tablet by mouth daily.       Diet: routine diet  Activity: Advance as tolerated. Pelvic rest for 6 weeks.   Outpatient follow up: Follow-up Information    Will Bonnet, MD. Schedule an appointment as soon as possible for a visit in 2 days.   Specialty:  Obstetrics and Gynecology Why:  postop incision check, BP check Contact information: [address] [place] [address] [phone]             Postpartum contraception: Undecided Rhogam Given postpartum:  no Rubella vaccine given postpartum: no Varicella vaccine given postpartum: no TDaP given antepartum or postpartum: Given AP on 09/23/17 Influenza vaccine: given AP on 05/14/2018  Newborn Data: Live born female  Birth Weight: 5 lb 12.4 oz (2620 g) APGAR: 9, 9  Newborn Delivery   Birth date/time:  11/14/2017 08:35:00 Delivery type:  C-Section, Low Transverse Trial of labor:  No C-section categorization:  Primary    Baby Feeding: Bottle  Disposition:home with mother  SIGNED: Rod Can, CNM

## 2017-11-15 NOTE — Progress Notes (Addendum)
L&D Progress Note    S: Complains of severe pain in right shoulder/neck area- spasmic pain that shoots into shoulder/neck periodically Crying with pain. Just received 2 Percocet. Mild increase in discomfort turning head to the left.   O: BP (!) 149/104   Pulse 97   Temp 99.1 F (37.3 C) (Oral)   Resp 18   Ht 5\' 5"  (1.651 m)   Wt 62.6 kg (138 lb)   LMP 02/14/2017   SpO2 99%   Breastfeeding? Unknown   BMI 22.96 kg/m    Has received 60 mgm Procardia XL since discontinuing her magnesium sulfate  No tachypnea, normal respiratory effort, with O2 sats in 99-100% range  Right sternocleidodomastoid muscle is TTP, chest wall on right is not TTP  Lungs have been CTAB  A: Acute MSK pain   P: Skelaxin 800 mgm tid prn pain. Continue to monitor blood pressures Heat to neck/shoulder area  Dalia Heading, CNM

## 2017-11-15 NOTE — Anesthesia Postprocedure Evaluation (Signed)
Anesthesia Post Note  Patient: [name]  Procedure(s) Performed: CESAREAN SECTION (N/A )  Patient location during evaluation: Mother Baby Anesthesia Type: Spinal Level of consciousness: oriented and awake and alert Pain management: pain level controlled Vital Signs Assessment: post-procedure vital signs reviewed and stable Respiratory status: spontaneous breathing, respiratory function stable and nonlabored ventilation Cardiovascular status: blood pressure returned to baseline and stable Postop Assessment: no headache, no backache and no apparent nausea or vomiting Anesthetic complications: no     Last Vitals:  Vitals:   11/15/17 0755 11/15/17 0800  BP:    Pulse:    Resp:    Temp:    SpO2: 98% 98%    Last Pain:  Vitals:   11/15/17 0738  TempSrc: Oral  PainSc:                  Martha Clan

## 2017-11-15 NOTE — Progress Notes (Signed)
POD #1 s/p Primary CS due to previous recent oophorectomy. She also has preeclampsia with severe features which was diagnosed postpartum. She was begun on magnesium sulfate at 1130 yesterday morning after having severe range blood pressures and a PC of 470mm.  Subjective:   Doing OK. No nausea. Having some right shoulder pain and reflux. Is bottle feeding Liam.   Objective:  Blood pressure 139/90, pulse 80, temperature 98.9 F (37.2 C), temperature source Oral, resp. rate 18, height _0  (1.651 m), weight 62.6 kg (138 lb), last menstrual period 02/14/2017, SpO2 98 %, unknown if currently breastfeeding. Patient Vitals for the past 24 hrs:  BP Temp Temp src Pulse Resp SpO2  11/15/17 0800 - - - - - 98 %  11/15/17 0755 - - - - - 98 %  11/15/17 0750 - - - - - 98 %  11/15/17 0745 - - - - - 97 %  11/15/17 0740 - - - - - 97 %  11/15/17 0738 - 98.9 F (37.2 C) Oral - - -  11/15/17 0735 - - - - - 98 %  11/15/17 0732 139/90 - - 80 - -  11/15/17 0730 - - - - - 96 %  11/15/17 0725 - - - - - 96 %  11/15/17 0720 - - - - - 96 %  11/15/17 0715 - - - - - 96 %  11/15/17 0710 - - - - - 96 %  11/15/17 0700 - - - - - 96 %  11/15/17 0626 121/89 - - 69 18 -  11/15/17 0527 131/84 - - 63 18 -  11/15/17 0430 - - - - - 96 %  11/15/17 0426 (!) 130/112 - - 96 - -  11/15/17 0425 - - - - - 95 %  11/15/17 0330 - - - - - 96 %  11/15/17 0326 (!) 138/95 - - 75 - -  11/15/17 0230 - - - - - 94 %  11/15/17 0226 138/81 - - 74 18 -  11/15/17 0225 - - - - - 94 %  11/15/17 0126 121/80 - - 67 18 -  11/15/17 0125 - - - - - 94 %  11/15/17 0026 132/87 97.8 F (36.6 C) Oral 70 18 -  11/14/17 2240 - - - - - 97 %  11/14/17 2232 (!) 146/109 - - 80 - -  11/14/17 2132 (!) 148/97 - - 78 - -  11/14/17 2130 - - - - - 97 %  11/14/17 1927 (!) 87/33 98.1 F (36.7 C) Oral 97 18 100 %  11/14/17 1830 (!) 142/94 97.9 F (36.6 C) Oral 91 16 98 %  11/14/17 1730 (!) 142/96 - - 75 16 96 %  11/14/17 1630 135/90 98.2 F (36.8 C) Oral  79 18 95 %  11/14/17 1530 (!) 128/92 - - 91 16 -  11/14/17 1430 (!) 127/91 - - 86 16 -  11/14/17 1426 138/87 - - 78 18 -  11/14/17 1356 130/83 - - 89 - -  11/14/17 1330 (!) 141/93 - - 89 16 -  11/14/17 1326 (!) 141/93 - - 89 16 -  11/14/17 1256 (!) 139/95 - - 87 - -  11/14/17 1230 (!) 142/92 - - 89 18 -  11/14/17 1226 (!) 142/92 - - 89 - -  11/14/17 1215 (!) 179/97 - - (!) 109 18 99 %  11/14/17 1200 (!) 165/120 - - 89 16 100 %  11/14/17 1145 (!) 153/107 - - (!) 101 16  99 %  11/14/17 1130 (!) 158/98 97.8 F (36.6 C) - 84 16 97 %  11/14/17 1115 (!) 167/114 - - 83 - 98 %  11/14/17 1100 (!) 166/103 - - - - -  11/14/17 1045 (!) 167/106 - - 74 - 98 %  11/14/17 1030 (!) 162/119 - - 82 16 99 %  11/14/17 1020 (!) 180/104 - - 67 16 99 %  11/14/17 1001 (!) 144/100 97.6 F (36.4 C) Axillary 77 16 100 %   Last received antihypertensive medication at 1208 yesterday: hydralazine 10 mgm. UO 5940 yesterday: 250-475 ml/hr  General: NAD, sitting up in bed Pulmonary: no increased work of breathing/ CTAB Heart: RRR without murmur Abdomen: softly distended, appropriately tender, bowel sounds present x 4 Incision: Dressing C+D+I; On Q intact Extremities: SCDs on  Results for orders placed or performed during the hospital encounter of 11/14/17 (from the past 72 hour(s))  Comprehensive metabolic panel     Status: Abnormal   Collection Time: 11/14/17  7:14 AM  Result Value Ref Range   Sodium 134 (L) 135 - 145 mmol/L   Potassium 4.1 3.5 - 5.1 mmol/L   Chloride 106 101 - 111 mmol/L   CO2 21 (L) 22 - 32 mmol/L   Glucose, Bld 79 65 - 99 mg/dL   BUN 8 6 - 20 mg/dL   Creatinine, Ser 0.64 0.44 - 1.00 mg/dL   Calcium 8.7 (L) 8.9 - 10.3 mg/dL   Total Protein 7.2 6.5 - 8.1 g/dL   Albumin 3.5 3.5 - 5.0 g/dL   AST 20 15 - 41 U/L   ALT 8 (L) 14 - 54 U/L   Alkaline Phosphatase 180 (H) 38 - 126 U/L   Total Bilirubin 0.5 0.3 - 1.2 mg/dL   GFR calc non Af Amer >60 >60 mL/min   GFR calc Af Amer >60 >60  mL/min    Comment: (NOTE) The eGFR has been calculated using the CKD EPI equation. This calculation has not been validated in all clinical situations. eGFR's persistently <60 mL/min signify possible Chronic Kidney Disease.    Anion gap 7 5 - 15    Comment: Performed at [facility], [address] [place], [place] [address]  CBC     Status: Abnormal   Collection Time: 11/14/17  7:14 AM  Result Value Ref Range   WBC 12.1 (H) 3.6 - 11.0 K/uL   RBC 4.57 3.80 - 5.20 MIL/uL   Hemoglobin 13.0 12.0 - 16.0 g/dL   HCT 38.0 35.0 - 47.0 %   MCV 83.2 80.0 - 100.0 fL   MCH 28.4 26.0 - 34.0 pg   MCHC 34.1 32.0 - 36.0 g/dL   RDW 15.0 (H) 11.5 - 14.5 %   Platelets 232 150 - 440 K/uL    Comment: Performed at [facility], [address] [place], [place] [address]  Protein / creatinine ratio, urine     Status: Abnormal   Collection Time: 11/14/17 10:56 AM  Result Value Ref Range   Creatinine, Urine 20 mg/dL   Total Protein, Urine 8 mg/dL    Comment: NO NORMAL RANGE ESTABLISHED FOR THIS TEST   Protein Creatinine Ratio 0.40 (H) 0.00 - 0.15 mg/mg[Cre]    Comment: Performed at [facility], [address], [place], [place] [address]  Urine Drug Screen, Qualitative ([place] only)     Status: Abnormal   Collection Time: 11/14/17 10:56 AM  Result Value Ref Range   Tricyclic, Ur Screen NONE DETECTED NONE DETECTED  Amphetamines, Ur Screen NONE DETECTED NONE DETECTED   MDMA (Ecstasy)Ur Screen NONE DETECTED NONE DETECTED   Cocaine Metabolite,Ur [place] NONE DETECTED NONE DETECTED   Opiate, Ur Screen POSITIVE (A) NONE DETECTED   Phencyclidine (PCP) Ur S NONE DETECTED NONE DETECTED   Cannabinoid 50 Ng, Ur [place] POSITIVE (A) NONE DETECTED   Barbiturates, Ur Screen NONE DETECTED NONE DETECTED   Benzodiazepine, Ur Scrn NONE DETECTED NONE DETECTED   Methadone Scn, Ur NONE DETECTED NONE DETECTED    Comment: (NOTE) Tricyclics + metabolites, urine    Cutoff 1000 ng/mL Amphetamines +  metabolites, urine  Cutoff 1000 ng/mL MDMA (Ecstasy), urine              Cutoff 500 ng/mL Cocaine Metabolite, urine          Cutoff 300 ng/mL Opiate + metabolites, urine        Cutoff 300 ng/mL Phencyclidine (PCP), urine         Cutoff 25 ng/mL Cannabinoid, urine                 Cutoff 50 ng/mL Barbiturates + metabolites, urine  Cutoff 200 ng/mL Benzodiazepine, urine              Cutoff 200 ng/mL Methadone, urine                   Cutoff 300 ng/mL The urine drug screen provides only a preliminary, unconfirmed analytical test result and should not be used for non-medical purposes. Clinical consideration and professional judgment should be applied to any positive drug screen result due to possible interfering substances. A more specific alternate chemical method must be used in order to obtain a confirmed analytical result. Gas chromatography / mass spectrometry (GC/MS) is the preferred confirmat ory method. Performed at [facility], [address] [place], [place] [address]   CBC     Status: Abnormal   Collection Time: 11/15/17  6:54 AM  Result Value Ref Range   WBC 11.3 (H) 3.6 - 11.0 K/uL   RBC 4.03 3.80 - 5.20 MIL/uL   Hemoglobin 11.5 (L) 12.0 - 16.0 g/dL   HCT 33.6 (L) 35.0 - 47.0 %   MCV 83.4 80.0 - 100.0 fL   MCH 28.6 26.0 - 34.0 pg   MCHC 34.2 32.0 - 36.0 g/dL   RDW 15.1 (H) 11.5 - 14.5 %   Platelets 212 150 - 440 K/uL    Comment: Performed at [facility], [address] [place], [place] [address]     Assessment:   22 y.o. G1P1001 postoperativeday # 1-stable  Regular diet  Pepcid Preeclampsia with severe features-most blood pressures in the mild range, occasional severe range diastolic  Diuresis beginning-good urine out put  Continue magnesium sulfate until 1130 today  Discontinue foley at that time and assist OOB  Start Procardia when discontinuing magnesium sulfate     Plan:  1) Mild anemia - hemodynamically stable and asymptomatic -  prenatal vitamins with iron  2) B POS/ VI/ RI  3) TDAP 09/23/2017  4)Bottle  5) Transfer to [facility] after magnesium discontinued. Discharge probably on POD 3 or [place], [place]  5) wi

## 2017-11-15 NOTE — Progress Notes (Signed)
Pt reports relief from right shoulder/neck pain ("it feels much better") with heat applied and Skelaxin 800 mg given. Most recent BP 138/85. Will continue to monitor pain and BP.

## 2017-11-16 NOTE — Clinical Social Work Maternal (Signed)
  CLINICAL SOCIAL WORK MATERNAL/CHILD NOTE  Patient Details  Name: Tiffany Guzman MRN: [id number] Date of Birth: [birth year]  Date:  11/16/2017  Clinical Social Worker Initiating Note:  Santiago Bumpers, MSW, [place]  Date/Time: Initiated:  11/16/17/1641     Child's Name:  Tiffany Guzman   Biological Parents:  Mother, Father   Need for Interpreter:  None   Reason for Referral:  Current Substance Use/Substance Use During Pregnancy    Address:  [place] [address]    Phone number:  [phone] (home)     Additional phone [place] Members/Support Persons (HM/SP):   Household Member/Support Person 1   HM/SP Name Relationship DOB or Age  HM/SP -1 Tiffany Guzman FOB N/A  HM/SP -2        HM/SP -3        HM/SP -4        HM/SP -5        HM/SP -6        HM/SP -7        HM/SP -8          Natural Supports (not living in the home):  Community, Armed forces technical officer, Friends, Immediate Family, Extended Family, Armed forces training and education officer Supports:     Employment: Unemployed   Type of Work: N/A   Education:  High school graduate   Homebound arranged:    Museum/gallery curator Resources:  Medicaid   Other Resources:  [facility]   Cultural/Religious Considerations Which Guzman Impact Care:  None reported  Strengths:  Ability to meet basic needs , Compliance with medical plan , Home prepared for child , Understanding of illness, Pediatrician chosen   Psychotropic Medications:         Pediatrician:    [employer]  Pediatrician List:   [facility]      Pediatrician Fax Number:    Risk Factors/Current Problems:  Substance Use    Cognitive State:  Alert , Goal Oriented , Insightful , Linear Thinking    Mood/Affect:  Calm , Relaxed , Comfortable    CSW Assessment: The CSW met with the patient at bedside. The patient requested that the FOB remain in  the room for the discussion. The CSW introduced self and role in care. The patient was cooperative throughout the assessment. The patient admitted to almost weekly use of cannabis during her pregnancy due to nausea and low appetite. The patient last used marijuana the Wednesday prior to labor. The patient has chosen [place] for her pediatrician, and she and the FOB are prepared for the child to discharge home. The patient has no other children.  The CSW explained mandated reporting and the UDS screen results. The patient and FOB are aware that the CSW will be making a mandated report to CPS and that they should expect contact within the next 72 hours. The family confirmed their demographics.  The CSW has made a report to [place], the on call [place] worker for [facility]. Tiffany Guzman received the report and indicated that a CPS worker would reach out to the family I the next 45 hours. The CSW is signing off. Please consult should needs arise. The patient can discharge home with the infant per CPS/DSS.  CSW Plan/Description:  Child Protective Service Report     Tiffany Guzman, Tiffany Guzman 11/16/2017, 4:43 PM

## 2017-11-16 NOTE — Progress Notes (Signed)
  Subjective:   Post Op Day 2. Doing well today. Shoulder pain has improved. Able to tolerate PO intake and incision pain is controlled with PO medication and On Q pump. Shoulder pain well controlled with K pad and PO medication. No headache, change of vision or epigastric pain. Ambulating and voiding without difficulty. Bonding well with baby.   Objective:  Blood pressure (!) 133/99, pulse 83, temperature 98.1 F (36.7 C), temperature source Oral, resp. rate 20, height 5\' 5"  (1.651 m), weight 138 lb (62.6 kg), last menstrual period 02/14/2017, SpO2 99 %.  General: NAD Pulmonary: no increased work of breathing Abdomen: non-distended, non-tender, fundus firm at level of umbilicus Incision: Dressing is C/D/I Extremities: no edema, no erythema, no tenderness    Assessment:   22 y.o. G1P1001 postoperativeday # 2   Plan:  1) Acute blood loss anemia - hemodynamically stable and asymptomatic - po ferrous sulfate  2) B positive, Rubella Immune, Varicella Immune  3) TDAP status: UTD  4) Bottle  5) Procardia XL 60 mg q day  6) Disposition: discharge to home likely tomorrow  Rod Can, CNM

## 2017-11-16 NOTE — Progress Notes (Signed)
Gwynneth Macleod CNM notified of Pt. B/P's of 147/109 at 1950 and 148/102 at 2011. Pt. Denies any c/o. No new orders received and CNM stated she would not add any hypertensive medications unless Pt. B/P is >/equal to 160/110.

## 2017-11-16 NOTE — Plan of Care (Signed)
Alert and oriented with quiet affect. Color good, skin w&d. BBS clear. Fundus is firm at U/-1 with small to scant lochia. Has had stated pain control with PRN medications of Percocet and Motrin. Pt did have c/o nausea at 0217 appx. One hour after having  received Percocet. Pt. Stated she had relief of Nausea after IV Zofran but that there was still occasional waves of Nausea. Pt. Has tolerated water and Saltine crackers but did not eat any of her dinner earlier today and Pt.stated she didn't feel hungry. Encouraged to be up and ambulatory to prevent ileus. Pt. Has been up twice to void this shift. B/Ps cont. Borderline high at 131/87 and 132/97. No other signs of PIH.

## 2017-11-16 NOTE — Progress Notes (Signed)
Offered Pt. Skelaxin as per PRN order for C/O left shoulder pain and Pt. Stated she had taken this earlier and the medication did not help as much as the Percocet; therefor, Percocet given as per PRN order.

## 2017-11-17 MED ORDER — NIFEDIPINE ER OSMOTIC RELEASE 30 MG PO TB24
90.0000 mg | ORAL_TABLET | Freq: Every day | ORAL | Status: DC
  Administered 2017-11-18: 90 mg via ORAL
  Filled 2017-11-17: qty 3

## 2017-11-17 MED ORDER — PROMETHAZINE HCL 12.5 MG PO TABS
12.5000 mg | ORAL_TABLET | Freq: Four times a day (QID) | ORAL | Status: DC | PRN
Start: 2017-11-17 — End: 2017-11-18
  Administered 2017-11-17: 12.5 mg via ORAL
  Filled 2017-11-17 (×2): qty 1

## 2017-11-17 MED ORDER — NIFEDIPINE ER OSMOTIC RELEASE 30 MG PO TB24
30.0000 mg | ORAL_TABLET | Freq: Once | ORAL | Status: AC
  Administered 2017-11-17: 30 mg via ORAL
  Filled 2017-11-17: qty 1

## 2017-11-17 MED ORDER — LABETALOL HCL 200 MG PO TABS
200.0000 mg | ORAL_TABLET | Freq: Once | ORAL | Status: AC
  Administered 2017-11-17: 200 mg via ORAL
  Filled 2017-11-17: qty 1

## 2017-11-17 MED ORDER — BISACODYL 10 MG RE SUPP
10.0000 mg | Freq: Every day | RECTAL | Status: DC | PRN
  Administered 2017-11-17: 10 mg via RECTAL
  Filled 2017-11-17: qty 1

## 2017-11-17 MED ORDER — GLYCERIN (LAXATIVE) 2.1 G RE SUPP
1.0000 | Freq: Once | RECTAL | Status: AC
  Administered 2017-11-17: 1 via RECTAL
  Filled 2017-11-17: qty 1

## 2017-11-17 MED ORDER — FLEET ENEMA 7-19 GM/118ML RE ENEM: 1.0000 | ENEMA | Freq: Every day | RECTAL | Status: DC | PRN

## 2017-11-17 NOTE — Progress Notes (Signed)
POD #3 Primary LTCS/ preeclampsia with severe features Subjective:   Passing flatus, but has not had a BM. Given glycerin suppository this AM-no results. Nauseous this AM, she thinks from being constipated. Right shoulder pain is better, using heat intermittently. No headache. HAs some visual changes when moving head (slow motion).   Objective:  Blood pressure 127/89, pulse 89, temperature 98.2 F (36.8 C), temperature source Oral, resp. rate 18, height 5\' 5"  (1.651 m), weight 62.6 kg (138 lb), last menstrual period 02/14/2017, SpO2 99 %, unknown if currently breastfeeding. Blood pressure range on Procardia 60 mgm XL: 130s to 150s/89-109. Given labetalol 1200 mgm this AM and blood pressure then 127/89 General: NAD Pulmonary: no increased work of breathing Abdomen: non-distended, non-tender, fundus firm at level of umbilicus Incision: Extremities: no edema, no erythema, no tenderness  Results for orders placed or performed during the hospital encounter of 11/14/17 (from the past 72 hour(s))  Protein / creatinine ratio, urine     Status: Abnormal   Collection Time: 11/14/17 10:56 AM  Result Value Ref Range   Creatinine, Urine 20 mg/dL   Total Protein, Urine 8 mg/dL    Comment: NO NORMAL RANGE ESTABLISHED FOR THIS TEST   Protein Creatinine Ratio 0.40 (H) 0.00 - 0.15 mg/mg[Cre]    Comment: Performed at [facility], [address] [place], [place] [address]  Urine Drug Screen, Qualitative ([place] only)     Status: Abnormal   Collection Time: 11/14/17 10:56 AM  Result Value Ref Range   Tricyclic, Ur Screen NONE DETECTED NONE DETECTED   Amphetamines, Ur Screen NONE DETECTED NONE DETECTED   MDMA (Ecstasy)Ur Screen NONE DETECTED NONE DETECTED   Cocaine Metabolite,Ur [place] NONE DETECTED NONE DETECTED   Opiate, Ur Screen POSITIVE (A) NONE DETECTED   Phencyclidine (PCP) Ur S NONE DETECTED NONE DETECTED   Cannabinoid 50 Ng, Ur [place] POSITIVE (A) NONE DETECTED   Barbiturates, Ur Screen NONE  DETECTED NONE DETECTED   Benzodiazepine, Ur Scrn NONE DETECTED NONE DETECTED   Methadone Scn, Ur NONE DETECTED NONE DETECTED    Comment: (NOTE) Tricyclics + metabolites, urine    Cutoff 1000 ng/mL Amphetamines + metabolites, urine  Cutoff 1000 ng/mL MDMA (Ecstasy), urine              Cutoff 500 ng/mL Cocaine Metabolite, urine          Cutoff 300 ng/mL Opiate + metabolites, urine        Cutoff 300 ng/mL Phencyclidine (PCP), urine         Cutoff 25 ng/mL Cannabinoid, urine                 Cutoff 50 ng/mL Barbiturates + metabolites, urine  Cutoff 200 ng/mL Benzodiazepine, urine              Cutoff 200 ng/mL Methadone, urine                   Cutoff 300 ng/mL The urine drug screen provides only a preliminary, unconfirmed analytical test result and should not be used for non-medical purposes. Clinical consideration and professional judgment should be applied to any positive drug screen result due to possible interfering substances. A more specific alternate chemical method must be used in order to obtain a confirmed analytical result. Gas chromatography / mass spectrometry (GC/MS) is the preferred confirmat ory method. Performed at [facility], [address] [place], [place] [address]   CBC     Status: Abnormal   Collection Time:  11/15/17  6:54 AM  Result Value Ref Range   WBC 11.3 (H) 3.6 - 11.0 K/uL   RBC 4.03 3.80 - 5.20 MIL/uL   Hemoglobin 11.5 (L) 12.0 - 16.0 g/dL   HCT 33.6 (L) 35.0 - 47.0 %   MCV 83.4 80.0 - 100.0 fL   MCH 28.6 26.0 - 34.0 pg   MCHC 34.2 32.0 - 36.0 g/dL   RDW 15.1 (H) 11.5 - 14.5 %   Platelets 212 150 - 440 K/uL    Comment: Performed at [facility], [address] [place], [place] [address]     Assessment/ Plan:   22 y.o. G1P1001 postoperativeday # 3  Slow return to normal GI function/ constipation   Hot coffee, prune juice, Dulcolax supp, Fleet's enema. Already receiving [place]  Mild range blood pressures on Procardia 60 mgm  XL   Will discuss with MD whether to increase Procardia dose or whether to add   labetalol 200 mgm BID  Mild anemia - hemodynamically stable and asymptomatic   vitamins   B POS/RI/VI  TDAP UTD  Bottle   Contraception?  Disposition: Probable discharge tomorrow  Dalia Heading, CNM

## 2017-11-17 NOTE — Progress Notes (Signed)
Called Tiffany Guzman CNM called and I reported all of Pt.'s B/P's tonight and my concern that even though her B/P is not > or equal to 979/150, her diastolic B/P's are still very concerning. CNM sated she will call me back with Labetalol order.

## 2017-11-18 MED ORDER — OXYCODONE-ACETAMINOPHEN 5-325 MG PO TABS: 1.0000 | ORAL_TABLET | Freq: Four times a day (QID) | ORAL | 0 refills | Status: AC | PRN

## 2017-11-18 MED ORDER — NIFEDIPINE ER OSMOTIC RELEASE 30 MG PO TB24: 90.0000 mg | ORAL_TABLET | Freq: Every day | ORAL | 2 refills | Status: DC

## 2017-11-18 NOTE — Progress Notes (Signed)
On q pump removed by patient.Site is clean, dry, and intact.  Patient states catheter was intact upon removal.

## 2017-11-18 NOTE — Progress Notes (Signed)
Patient discharged home with infant. Discharge instructions, prescriptions and follow up appointment given to and reviewed with patient. Patient verbalized understanding.  Patients VS stable, denies headache and blurry vision.  Heavy D/C teaching done on postpartum hypertension and advised patient to take her BP at home a couple of times a day. Patient verbalized understanding.

## 2017-11-18 NOTE — Progress Notes (Signed)
On Q pump completely infused. MD wanted it to be taken out before being discharged. Patient stated that the tape was causing her pain, and that she wanted to remove the tape and on-q pump herself if that was okay.

## 2017-11-20 ENCOUNTER — Encounter: Payer: Self-pay | Admitting: Obstetrics & Gynecology

## 2017-11-20 ENCOUNTER — Ambulatory Visit (INDEPENDENT_AMBULATORY_CARE_PROVIDER_SITE_OTHER): Payer: Medicaid Other | Admitting: Obstetrics & Gynecology

## 2017-11-20 VITALS — BP 110/70 | Ht 65.0 in | Wt 120.0 lb

## 2017-11-20 DIAGNOSIS — O165 Unspecified maternal hypertension, complicating the puerperium: Secondary | ICD-10-CM | POA: Diagnosis not present

## 2017-11-20 NOTE — Progress Notes (Signed)
Obstetrics & Gynecology Office Visit   Chief Complaint:  Chief Complaint  Patient presents with  . Blood Pressure Check   History of Present Illness: 22 y.o. G1P1001 being seen for follow up blood pressure check today.  The patient is PPThe established diagnosis for the patient is gestational hypertension.  She is currently on nifedipine ER 90mg .  She reports no current symptoms attributable to her blood pressure.  Medication list reviewed no percocet utilized, no pain.  Pt denies headache, blurry vision, otehr pain..  Past Medical History:  Past Medical History:  Diagnosis Date  . Anxiety   . Attention deficit hyperactivity disorder (ADHD)   . Chronic upper back pain   . Depression   . GERD (gastroesophageal reflux disease)   . Migraine with aura    since elementary school    Past Surgical History:  Past Surgical History:  Procedure Laterality Date  . CESAREAN SECTION N/A 11/14/2017   Procedure: CESAREAN SECTION;  Surgeon: Will Bonnet, MD;  Location: [facility];  Service: Obstetrics;  Laterality: N/A;  . INSERTION OF NON VAGINAL CONTRACEPTIVE DEVICE    . OOPHORECTOMY Left 10/03/2017   Procedure: OOPHORECTOMY;  Surgeon: Homero Fellers, MD;  Location: [facility];  Service: Gynecology;  Laterality: Left;  . OVARIAN CYST REMOVAL Left 10/03/2017   Procedure: OVARIAN CYSTECTOMY;  Surgeon: Homero Fellers, MD;  Location: [facility];  Service: Gynecology;  Laterality: Left;    Gynecologic History: No LMP recorded.  Obstetric History: G1P1001  Family History:  Family History  Problem Relation Age of Onset  . Hypertension Mother   . Hypothyroidism Mother   . Depression Mother   . Diabetes Maternal Grandmother     Social History:  Social History   Socioeconomic History  . Marital status: Single    Spouse name: Not on file  . Number of children: Not on file  . Years of education: Not on file  . Highest education level: Not on file  Occupational History    . Not on file  Social Needs  . Financial resource strain: Not on file  . Food insecurity:    Worry: Not on file    Inability: Not on file  . Transportation needs:    Medical: Not on file    Non-medical: Not on file  Tobacco Use  . Smoking status: Never Smoker  . Smokeless tobacco: Never Used  Substance and Sexual Activity  . Alcohol use: No    Alcohol/week: 0.0 oz  . Drug use: Yes    Frequency: 7.0 times per week    Types: Marijuana  . Sexual activity: Not Currently    Partners: Male    Birth control/protection: None  Lifestyle  . Physical activity:    Days per week: Not on file    Minutes per session: Not on file  . Stress: Not on file  Relationships  . Social connections:    Talks on phone: Not on file    Gets together: Not on file    Attends religious service: Not on file    Active member of club or organization: Not on file    Attends meetings of clubs or organizations: Not on file    Relationship status: Not on file  . Intimate partner violence:    Fear of current or ex partner: Not on file    Emotionally abused: Not on file    Physically abused: Not on file    Forced sexual activity: Not on file  Other Topics Concern  . Not on file  Social History Narrative  . Not on file    Allergies:  No Known Allergies  Medications: Prior to Admission medications   Medication Sig Start Date End Date Taking? Authorizing Provider  NIFEdipine (PROCARDIA-XL/ADALAT-CC/NIFEDICAL-XL) 30 MG 24 hr tablet Take 3 tablets (90 mg total) by mouth daily. 11/18/17 02/16/18 Yes Rod Can, CNM  oxyCODONE-acetaminophen (PERCOCET/ROXICET) 5-325 MG tablet Take 1 tablet by mouth every 6 (six) hours as needed for up to 5 days for moderate pain (pain score 4-7/10). Patient not taking: Reported on 11/20/2017 11/18/17 11/23/17  Rod Can, CNM  Prenatal Vit-Fe Fumarate-FA (PRENATAL VITAMIN PLUS LOW IRON) 27-1 MG TABS Take 1 tablet by mouth daily. Patient not taking: Reported on 11/07/2017  08/05/17   Will Bonnet, MD   Review of Systems  All other systems reviewed and are negative.  Physical Exam Blood pressure 110/70, height 5\' 5"  (1.651 m), weight 120 lb (54.4 kg), unknown if currently breastfeeding. General: NAD HEENT: normocephalic, anicteric Pulmonary: No increased work of breathing Cardiovascular: RRR, distal pulses 2+ Extremities: noedema, no erythema, no tenderness Neurologic: Grossly intact Psychiatric: mood appropriate, affect full  Assessment: 22 y.o. G1P1001 presenting for blood pressure evaluation today  Plan:   Visit Diagnoses    Postpartum hypertension    -  Primary     1) Blood pressure - blood pressure at today's visit is normotensive.  As a result adjustments were made to the patient's antihypertensive therapy. - additional blood work was not obtained  - decrease to 60mg  Procardia daily and then to 30 mg daily on Monday Keep appt.  Anticipate taper off meds soon. Monitor for s/sx worsening HTN  A total of 15 minutes were spent face-to-face with the patient during this encounter and over half of that time dealt with counseling and coordination of care.  Barnett Applebaum, MD, [facility], [facility] 11/20/2017  2:40 PM

## 2017-12-01 ENCOUNTER — Ambulatory Visit: Payer: Medicaid Other | Admitting: Obstetrics and Gynecology

## 2017-12-05 ENCOUNTER — Encounter: Payer: Self-pay | Admitting: Obstetrics and Gynecology

## 2017-12-05 ENCOUNTER — Ambulatory Visit (INDEPENDENT_AMBULATORY_CARE_PROVIDER_SITE_OTHER): Payer: Medicaid Other | Admitting: Obstetrics and Gynecology

## 2017-12-05 VITALS — BP 122/82 | Ht 65.0 in | Wt 119.0 lb

## 2017-12-05 DIAGNOSIS — Z98891 History of uterine scar from previous surgery: Secondary | ICD-10-CM

## 2017-12-05 DIAGNOSIS — Z09 Encounter for follow-up examination after completed treatment for conditions other than malignant neoplasm: Secondary | ICD-10-CM

## 2017-12-05 NOTE — Progress Notes (Signed)
   Postoperative Follow-up Patient presents post op from cesarean section  3weeks ago. She is also here for a PP BP check. At last check on 5/3 her dosing of nifedipine 90 mg was lowered to 60 mg with the plan of lowering the medication a few days later to 30 mg.   Subjective: She denies fever, chills, nausea and vomiting. Eating a regular diet without difficulty. The patient is not having any pain.  Activity: normal activities of daily living. She denies issues with her incision.  She stopped taking her BP medication a week ago and has no headache, visual changes, and RUQ pain today.   Objective: BP 122/82   Ht 5\' 5"  (1.651 m)   Wt 119 lb (54 kg)   Breastfeeding? No   BMI 19.80 kg/m   Constitutional: Well nourished, well developed female in no acute distress.  HEENT: normal Skin: Warm and dry.  Abdomen: Soft, non-tender, normal bowel sounds; no bruits, organomegaly or masses. clean, dry, intact and without erythema, induration, warmth, and tenderness Extremity: no edema   Assessment: 22 y.o. s/p cesarean section progressing well  Plan: Patient has done well after surgery with no apparent complications.  I have discussed the post-operative course to date, and the expected progress moving forward.  The patient understands what complications to be concerned about.    Activity plan: increase activity slowly. Wound care instruction discussed.  Patient still undecided regarding contraception. She is considering Nexplanon vs an IUD. She states she is feeling a little down and anxious. However, she states she is coping well and declines counseling and medication today. Will follow this up at her 6 week postpartum visit. She was encouraged to call, if symptoms worsen.   Return in about 3 weeks (around 12/26/2017) for Six Week Postpartum.  Prentice Docker, MD 12/05/2017 1:54 PM

## 2017-12-29 ENCOUNTER — Ambulatory Visit: Payer: Medicaid Other | Admitting: Obstetrics and Gynecology

## 2018-01-09 ENCOUNTER — Encounter: Payer: Self-pay | Admitting: Obstetrics and Gynecology

## 2018-01-09 ENCOUNTER — Telehealth: Payer: Self-pay | Admitting: Obstetrics and Gynecology

## 2018-01-09 ENCOUNTER — Ambulatory Visit (INDEPENDENT_AMBULATORY_CARE_PROVIDER_SITE_OTHER): Payer: Medicaid Other | Admitting: Obstetrics and Gynecology

## 2018-01-09 NOTE — Telephone Encounter (Signed)
nexplanon on 7/1 with SDJ

## 2018-01-09 NOTE — Progress Notes (Signed)
Postpartum Visit   Chief Complaint  Patient presents with  . Postpartum Care    6 weeks   History of Present Illness: Patient is a 22 y.o. G1P1001 presents for postpartum visit.  Date of delivery: 11/14/17 Type of delivery: C-section Episiotomy No.  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 Birth weight: 2,620 grams Maternal Details:  Breast Feeding:  no Post partum depression/anxiety noted:  no Edinburgh Post-Partum Depression Score:  3  Date of last PAP: 04/12/17  normal   Past Medical History:  Diagnosis Date  . Anxiety   . Attention deficit hyperactivity disorder (ADHD)   . Chronic upper back pain   . Depression   . GERD (gastroesophageal reflux disease)   . Migraine with aura    since elementary school    Past Surgical History:  Procedure Laterality Date  . CESAREAN SECTION N/A 11/14/2017   Procedure: CESAREAN SECTION;  Surgeon: Will Bonnet, MD;  Location: [facility];  Service: Obstetrics;  Laterality: N/A;  . INSERTION OF NON VAGINAL CONTRACEPTIVE DEVICE    . OOPHORECTOMY Left 10/03/2017   Procedure: OOPHORECTOMY;  Surgeon: Homero Fellers, MD;  Location: [facility];  Service: Gynecology;  Laterality: Left;  . OVARIAN CYST REMOVAL Left 10/03/2017   Procedure: OVARIAN CYSTECTOMY;  Surgeon: Homero Fellers, MD;  Location: [facility];  Service: Gynecology;  Laterality: Left;    Prior to Admission medications   Medication Sig Start Date End Date Taking? Authorizing Provider  NIFEdipine (PROCARDIA-XL/ADALAT-CC/NIFEDICAL-XL) 30 MG 24 hr tablet Take 3 tablets (90 mg total) by mouth daily. Patient not taking: Reported on 12/05/2017 11/18/17 02/16/18  Rod Can, CNM  Prenatal Vit-Fe Fumarate-FA (PRENATAL VITAMIN PLUS LOW IRON) 27-1 MG TABS Take 1 tablet by mouth  daily. Patient not taking: Reported on 11/07/2017 08/05/17   Will Bonnet, MD    No Known Allergies   Social History   Socioeconomic History  . Marital status: Single    Spouse name: Not on file  . Number of children: Not on file  . Years of education: Not on file  . Highest education level: Not on file  Occupational History  . Not on file  Social Needs  . Financial resource strain: Not on file  . Food insecurity:    Worry: Not on file    Inability: Not on file  . Transportation needs:    Medical: Not on file    Non-medical: Not on file  Tobacco Use  . Smoking status: Never Smoker  . Smokeless tobacco: Never Used  Substance and Sexual Activity  . Alcohol use: No    Alcohol/week: 0.0 oz  . Drug use: Yes    Frequency: 7.0 times per week    Types: Marijuana  . Sexual activity: Not Currently    Partners: Male    Birth control/protection: None  Lifestyle  . Physical activity:    Days per week: Not on file    Minutes per session: Not on file  . Stress: Not on file  Relationships  . Social connections:    Talks on phone: Not on file    Gets together: Not on file    Attends religious service: Not on file    Active member of club or organization: Not on file    Attends meetings of clubs or organizations:  Not on file    Relationship status: Not on file  . Intimate partner violence:    Fear of current or ex partner: Not on file    Emotionally abused: Not on file    Physically abused: Not on file    Forced sexual activity: Not on file  Other Topics Concern  . Not on file  Social History Narrative  . Not on file    Family History  Problem Relation Age of Onset  . Hypertension Mother   . Hypothyroidism Mother   . Depression Mother   . Diabetes Maternal Grandmother     Review of Systems  Constitutional: Negative.   HENT: Negative.   Eyes: Negative.   Respiratory: Negative.   Cardiovascular: Negative.   Gastrointestinal: Negative.   Genitourinary:  Negative.   Musculoskeletal: Negative.   Skin: Negative.   Neurological: Negative.   Psychiatric/Behavioral: Negative.      Physical Exam BP 112/64 (BP Location: Left Arm, Patient Position: Sitting, Cuff Size: Normal)   Pulse 72   Ht 5\' 5"  (1.651 m)   Wt 126 lb (57.2 kg)   LMP 12/25/2017 (Exact Date)   SpO2 99%   Breastfeeding? No   BMI 20.97 kg/m   Physical Exam  Constitutional: She is oriented to person, place, and time. She appears well-developed and well-nourished.  HENT:  Head: Normocephalic and atraumatic.  Eyes: Conjunctivae are normal. No scleral icterus.  Neck: Normal range of motion. Neck supple.  Cardiovascular: Normal rate and regular rhythm.  Pulmonary/Chest: Effort normal and breath sounds normal. No respiratory distress.  Abdominal: Soft. Bowel sounds are normal. She exhibits no distension.  Incision without erythema, induration, warmth, and tenderness, it is clean, dry, and intact  Musculoskeletal: Normal range of motion. She exhibits no edema.  Neurological: She is alert and oriented to person, place, and time. No cranial nerve deficit.  Skin: Skin is dry. No erythema.  Psychiatric: She has a normal mood and affect. Her behavior is normal. Judgment normal.   Pelvic deferred today  Assessment: 22 y.o. G1P1001 presenting for 6 week postpartum visit  Plan: Problem List Items Addressed This Visit    None    Visit Diagnoses    Postpartum care and examination    -  Primary       1) Contraception Education given regarding options for contraception, including Nexplanon. She will schedule to have this placed soon. Declined having it placed today.Marland Kitchen  2)  Pap - ASCCP guidelines and rational discussed.  Patient opts for routine screening interval  3) Patient underwent screening for postpartum depression with no concerns noted.  Return in about 1 week (around 01/16/2018) for Nexplanon placement with Dr. Glennon Mac.   Prentice Docker, MD 01/09/2018 11:25 AM

## 2018-01-13 NOTE — Telephone Encounter (Signed)
Noted. Will order to arrive by apt date/time. 

## 2018-01-19 ENCOUNTER — Encounter: Payer: Self-pay | Admitting: Obstetrics and Gynecology

## 2018-01-19 ENCOUNTER — Ambulatory Visit (INDEPENDENT_AMBULATORY_CARE_PROVIDER_SITE_OTHER): Payer: Medicaid Other | Admitting: Obstetrics and Gynecology

## 2018-01-19 VITALS — BP 102/64 | HR 75 | Ht 65.0 in | Wt 121.5 lb

## 2018-01-19 DIAGNOSIS — Z30011 Encounter for initial prescription of contraceptive pills: Secondary | ICD-10-CM

## 2018-01-19 MED ORDER — NORETHINDRONE 0.35 MG PO TABS: 1.0000 | ORAL_TABLET | Freq: Every day | ORAL | 4 refills | Status: DC

## 2018-01-19 NOTE — Progress Notes (Signed)
Obstetrics & Gynecology Office Visit   Chief Complaint  Patient presents with  . Contraception    Discuss contraception    History of Present Illness: Patient is a 22 y.o. G1P1001 presenting for contraception consult.  She is currently on no form of contraception and desiring to start birth controll.  She has a past medical history significant for migraine with aura.  She specifically denies a history of migraine with aura.  Reported Patient's last menstrual period was 12/25/2017 (exact date).  Past Medical History:  Diagnosis Date  . Anxiety   . Attention deficit hyperactivity disorder (ADHD)   . Chronic upper back pain   . Depression   . GERD (gastroesophageal reflux disease)   . Migraine with aura    since elementary school   Past Surgical History:  Procedure Laterality Date  . CESAREAN SECTION N/A 11/14/2017   Procedure: CESAREAN SECTION;  Surgeon: Will Bonnet, MD;  Location: [facility];  Service: Obstetrics;  Laterality: N/A;  . INSERTION OF NON VAGINAL CONTRACEPTIVE DEVICE    . OOPHORECTOMY Left 10/03/2017   Procedure: OOPHORECTOMY;  Surgeon: Homero Fellers, MD;  Location: [facility];  Service: Gynecology;  Laterality: Left;  . OVARIAN CYST REMOVAL Left 10/03/2017   Procedure: OVARIAN CYSTECTOMY;  Surgeon: Homero Fellers, MD;  Location: [facility];  Service: Gynecology;  Laterality: Left;    Gynecologic History: Patient's last menstrual period was 12/25/2017 (exact date).  Obstetric History: G1P1001  Family History  Problem Relation Age of Onset  . Hypertension Mother   . Hypothyroidism Mother   . Depression Mother   . Diabetes Maternal Grandmother     Social History   Socioeconomic History  . Marital status: Single    Spouse name: Not on file  . Number of children: Not on file  . Years of education: Not on file  . Highest education level: Not on file  Occupational History  . Not on file  Social Needs  . Financial resource strain: Not on file   . Food insecurity:    Worry: Not on file    Inability: Not on file  . Transportation needs:    Medical: Not on file    Non-medical: Not on file  Tobacco Use  . Smoking status: Never Smoker  . Smokeless tobacco: Never Used  Substance and Sexual Activity  . Alcohol use: No    Alcohol/week: 0.0 oz  . Drug use: Yes    Frequency: 7.0 times per week    Types: Marijuana  . Sexual activity: Not Currently    Partners: Male    Birth control/protection: None  Lifestyle  . Physical activity:    Days per week: Not on file    Minutes per session: Not on file  . Stress: Not on file  Relationships  . Social connections:    Talks on phone: Not on file    Gets together: Not on file    Attends religious service: Not on file    Active member of club or organization: Not on file    Attends meetings of clubs or organizations: Not on file    Relationship status: Not on file  . Intimate partner violence:    Fear of current or ex partner: Not on file    Emotionally abused: Not on file    Physically abused: Not on file    Forced sexual activity: Not on file  Other Topics Concern  . Not on file  Social History Narrative  . Not on  file    No Known Allergies  Prior to Admission medications   Medication Sig Start Date End Date Taking? Authorizing Provider  Prenatal Vit-Fe Fumarate-FA (PRENATAL VITAMIN PLUS LOW IRON) 27-1 MG TABS Take 1 tablet by mouth daily. 08/05/17  Yes Will Bonnet, MD  NIFEdipine (PROCARDIA-XL/ADALAT-CC/NIFEDICAL-XL) 30 MG 24 hr tablet Take 3 tablets (90 mg total) by mouth daily. Patient not taking: Reported on 12/05/2017 11/18/17 02/16/18  Rod Can, CNM    Review of Systems  Constitutional: Negative.   HENT: Negative.   Eyes: Negative.   Respiratory: Negative.   Cardiovascular: Negative.   Gastrointestinal: Negative.   Genitourinary: Negative.   Musculoskeletal: Negative.   Skin: Negative.   Neurological: Negative.   Psychiatric/Behavioral: Negative.        Physical Exam BP 102/64 (BP Location: Left Arm, Patient Position: Sitting, Cuff Size: Normal)   Pulse 75   Ht 5\' 5"  (1.651 m)   Wt 121 lb 8 oz (55.1 kg)   LMP 12/25/2017 (Exact Date)   SpO2 99%   BMI 20.22 kg/m  Patient's last menstrual period was 12/25/2017 (exact date). Physical Exam  Constitutional: She is oriented to person, place, and time. She appears well-developed and well-nourished. No distress.  HENT:  Head: Normocephalic and atraumatic.  Eyes: Conjunctivae are normal. No scleral icterus.  Neurological: She is alert and oriented to person, place, and time. No cranial nerve deficit.  Psychiatric: She has a normal mood and affect. Her behavior is normal. Judgment normal.    Female chaperone present for pelvic and breast  portions of the physical exam  Assessment: 22 y.o. G62P1001 female here for  1. Encounter for initial prescription of contraceptive pills      Plan: Problem List Items Addressed This Visit    None    Visit Diagnoses    Encounter for initial prescription of contraceptive pills    -  Primary   Relevant Medications   norethindrone (MICRONOR,CAMILA,ERRIN) 0.35 MG tablet     Reviewed all forms of birth control options available including abstinence; over the counter/barrier methods; hormonal contraceptive medication including pill, patch, ring, injection,contraceptive implant; hormonal and nonhormonal IUDs; permanent sterilization options including vasectomy and the various tubal sterilization modalities. Risks and benefits reviewed.  Questions were answered.  Information was given to patient to review.  She would like to start the progesterone-only birth control pill.  Rx sent for 1 year supply.   Prentice Docker, MD 01/19/2018 11:49 AM

## 2018-01-21 NOTE — Telephone Encounter (Signed)
Pt decided OCP's.

## 2019-01-28 ENCOUNTER — Emergency Department
Admission: EM | Admit: 2019-01-28 | Discharge: 2019-01-29 | Disposition: A | Discharge status: Home / Self Care | Payer: Medicaid Other | Attending: Emergency Medicine | Admitting: Emergency Medicine

## 2019-01-28 DIAGNOSIS — Z79899 Other long term (current) drug therapy: Secondary | ICD-10-CM | POA: Insufficient documentation

## 2019-01-28 DIAGNOSIS — R1110 Vomiting, unspecified: Secondary | ICD-10-CM | POA: Diagnosis not present

## 2019-01-28 DIAGNOSIS — D270 Benign neoplasm of right ovary: Secondary | ICD-10-CM | POA: Diagnosis not present

## 2019-01-28 DIAGNOSIS — R112 Nausea with vomiting, unspecified: Secondary | ICD-10-CM | POA: Diagnosis not present

## 2019-01-28 DIAGNOSIS — N83291 Other ovarian cyst, right side: Secondary | ICD-10-CM | POA: Diagnosis not present

## 2019-01-28 DIAGNOSIS — R197 Diarrhea, unspecified: Secondary | ICD-10-CM | POA: Insufficient documentation

## 2019-01-28 DIAGNOSIS — F1210 Cannabis abuse, uncomplicated: Secondary | ICD-10-CM | POA: Diagnosis not present

## 2019-01-28 DIAGNOSIS — D279 Benign neoplasm of unspecified ovary: Secondary | ICD-10-CM | POA: Diagnosis not present

## 2019-01-28 DIAGNOSIS — R102 Pelvic and perineal pain: Secondary | ICD-10-CM | POA: Diagnosis not present

## 2019-01-28 DIAGNOSIS — Z90721 Acquired absence of ovaries, unilateral: Secondary | ICD-10-CM | POA: Diagnosis not present

## 2019-01-28 DIAGNOSIS — R1032 Left lower quadrant pain: Secondary | ICD-10-CM | POA: Diagnosis present

## 2019-01-28 LAB — CBC
HCT: 44.1 % (ref 36.0–46.0)
Hemoglobin: 14.5 g/dL (ref 12.0–15.0)
MCH: 27.4 pg (ref 26.0–34.0)
MCHC: 32.9 g/dL (ref 30.0–36.0)
MCV: 83.2 fL (ref 80.0–100.0)
Platelets: 358 10*3/uL (ref 150–400)
RBC: 5.3 MIL/uL — ABNORMAL HIGH (ref 3.87–5.11)
RDW: 12.8 % (ref 11.5–15.5)
WBC: 20.2 10*3/uL — ABNORMAL HIGH (ref 4.0–10.5)
nRBC: 0 % (ref 0.0–0.2)

## 2019-01-28 MED ORDER — ONDANSETRON 4 MG PO TBDP
ORAL_TABLET | ORAL | Status: AC
  Administered 2019-01-28
  Filled 2019-01-28: qty 1

## 2019-01-28 MED ORDER — ONDANSETRON 4 MG PO TBDP: 4.0000 mg | ORAL_TABLET | Freq: Once | ORAL | Status: DC | PRN

## 2019-01-28 NOTE — ED Triage Notes (Signed)
Patient c/o lower abdominal pain and emesis. Patient reports hx of cysts on her ovaries.

## 2019-01-29 ENCOUNTER — Emergency Department: Payer: Medicaid Other

## 2019-01-29 DIAGNOSIS — Z90721 Acquired absence of ovaries, unilateral: Secondary | ICD-10-CM | POA: Diagnosis not present

## 2019-01-29 DIAGNOSIS — R112 Nausea with vomiting, unspecified: Secondary | ICD-10-CM | POA: Diagnosis not present

## 2019-01-29 DIAGNOSIS — D279 Benign neoplasm of unspecified ovary: Secondary | ICD-10-CM | POA: Diagnosis not present

## 2019-01-29 DIAGNOSIS — N83291 Other ovarian cyst, right side: Secondary | ICD-10-CM | POA: Diagnosis not present

## 2019-01-29 DIAGNOSIS — R1110 Vomiting, unspecified: Secondary | ICD-10-CM | POA: Diagnosis not present

## 2019-01-29 LAB — CBC
HCT: 34.8 % — ABNORMAL LOW (ref 36.0–46.0)
Hemoglobin: 11.5 g/dL — ABNORMAL LOW (ref 12.0–15.0)
MCH: 28 pg (ref 26.0–34.0)
MCHC: 33 g/dL (ref 30.0–36.0)
MCV: 84.7 fL (ref 80.0–100.0)
Platelets: 231 10*3/uL (ref 150–400)
RBC: 4.11 MIL/uL (ref 3.87–5.11)
RDW: 13 % (ref 11.5–15.5)
WBC: 15.1 10*3/uL — ABNORMAL HIGH (ref 4.0–10.5)
nRBC: 0 % (ref 0.0–0.2)

## 2019-01-29 LAB — URINALYSIS, COMPLETE (UACMP) WITH MICROSCOPIC
Bacteria, UA: NONE SEEN
Bilirubin Urine: NEGATIVE
Glucose, UA: NEGATIVE mg/dL
Hgb urine dipstick: NEGATIVE
Ketones, ur: NEGATIVE mg/dL
Nitrite: NEGATIVE
Protein, ur: NEGATIVE mg/dL
Specific Gravity, Urine: >1.046 — ABNORMAL HIGH (ref 1.005–1.030)
pH: 8 (ref 5.0–8.0)

## 2019-01-29 LAB — COMPREHENSIVE METABOLIC PANEL
ALT: 17 U/L (ref 0–44)
AST: 26 U/L (ref 15–41)
Albumin: 5.1 g/dL — ABNORMAL HIGH (ref 3.5–5.0)
Alkaline Phosphatase: 77 U/L (ref 38–126)
Anion gap: 12 (ref 5–15)
BUN: 12 mg/dL (ref 6–20)
CO2: 24 mmol/L (ref 22–32)
Calcium: 9.9 mg/dL (ref 8.9–10.3)
Chloride: 104 mmol/L (ref 98–111)
Creatinine, Ser: 0.77 mg/dL (ref 0.44–1.00)
GFR calc Af Amer: >60 mL/min (ref >60)
GFR calc non Af Amer: >60 mL/min (ref >60)
Glucose, Bld: 106 mg/dL — ABNORMAL HIGH (ref 70–99)
Potassium: 3.6 mmol/L (ref 3.5–5.1)
Sodium: 140 mmol/L (ref 135–145)
Total Bilirubin: 0.6 mg/dL (ref 0.3–1.2)
Total Protein: 8.6 g/dL — ABNORMAL HIGH (ref 6.5–8.1)

## 2019-01-29 LAB — LIPASE, BLOOD: Lipase: 30 U/L (ref 11–51)

## 2019-01-29 LAB — HCG, QUANTITATIVE, PREGNANCY: hCG, Beta Chain, Quant, S: 1 m[IU]/mL (ref <5)

## 2019-01-29 MED ORDER — SODIUM CHLORIDE 0.9 % IV BOLUS
1000.0000 mL | Freq: Once | INTRAVENOUS | Status: AC
  Administered 2019-01-29: 1000 mL via INTRAVENOUS

## 2019-01-29 MED ORDER — ONDANSETRON HCL 4 MG/2ML IJ SOLN
INTRAMUSCULAR | Status: AC
  Administered 2019-01-29: 4 mg via INTRAVENOUS
  Filled 2019-01-29: qty 2

## 2019-01-29 MED ORDER — KETOROLAC TROMETHAMINE 30 MG/ML IJ SOLN
30.0000 mg | Freq: Once | INTRAMUSCULAR | Status: AC
  Administered 2019-01-29: 30 mg via INTRAVENOUS
  Filled 2019-01-29: qty 1

## 2019-01-29 MED ORDER — ONDANSETRON 4 MG PO TBDP: 4.0000 mg | ORAL_TABLET | Freq: Three times a day (TID) | ORAL | 0 refills | Status: DC | PRN

## 2019-01-29 MED ORDER — KETOROLAC TROMETHAMINE 10 MG PO TABS: 10.0000 mg | ORAL_TABLET | Freq: Four times a day (QID) | ORAL | 0 refills | Status: DC | PRN

## 2019-01-29 MED ORDER — ONDANSETRON HCL 4 MG/2ML IJ SOLN
4.0000 mg | Freq: Once | INTRAMUSCULAR | Status: AC
  Administered 2019-01-29: 02:00:00 4 mg via INTRAVENOUS

## 2019-01-29 MED ORDER — MORPHINE SULFATE (PF) 2 MG/ML IV SOLN
2.0000 mg | Freq: Once | INTRAVENOUS | Status: AC
  Administered 2019-01-29: 2 mg via INTRAVENOUS
  Filled 2019-01-29: qty 1

## 2019-01-29 MED ORDER — METOCLOPRAMIDE HCL 5 MG/ML IJ SOLN
10.0000 mg | Freq: Once | INTRAMUSCULAR | Status: AC
  Administered 2019-01-29: 10 mg via INTRAVENOUS
  Filled 2019-01-29: qty 2

## 2019-01-29 MED ORDER — ONDANSETRON HCL 4 MG/2ML IJ SOLN
4.0000 mg | Freq: Once | INTRAMUSCULAR | Status: AC
  Administered 2019-01-29: 4 mg via INTRAVENOUS
  Filled 2019-01-29: qty 2

## 2019-01-29 MED ORDER — IOHEXOL 300 MG/ML  SOLN
100.0000 mL | Freq: Once | INTRAMUSCULAR | Status: AC | PRN
  Administered 2019-01-29: 100 mL via INTRAVENOUS

## 2019-01-29 NOTE — ED Notes (Signed)
Call from CT that pt is actively vomiting. Will notify EDP Brown.

## 2019-01-29 NOTE — ED Notes (Addendum)
Pt actively vomiting. Called lab about add-on bloodwork for preg check. Lab states they are running this now.

## 2019-01-29 NOTE — ED Notes (Addendum)
Pt given more warm blankets; pt states pain inc again; pt denies nausea. Pt states she will try to provide urine sample now. Peck notified.

## 2019-01-29 NOTE — ED Notes (Signed)
Pt currently unable to provide urine sample. Pt assisted back to bed.

## 2019-01-29 NOTE — ED Notes (Signed)
Pt given more warm blankets.  

## 2019-01-29 NOTE — ED Notes (Signed)
CT states pt agreeable to finish imaging. Pt will receive nausea med once back to room.

## 2019-01-29 NOTE — ED Notes (Signed)
Pt understands that a urine sample is needed for a preg test before CT can be completed. Pt will let this RN know once sample is ready.

## 2019-01-31 ENCOUNTER — Emergency Department
Admission: EM | Admit: 2019-01-31 | Discharge: 2019-01-31 | Disposition: A | Discharge status: Home / Self Care | Payer: Medicaid Other | Attending: Emergency Medicine | Admitting: Emergency Medicine

## 2019-01-31 ENCOUNTER — Other Ambulatory Visit: Payer: Self-pay

## 2019-01-31 DIAGNOSIS — R52 Pain, unspecified: Secondary | ICD-10-CM | POA: Diagnosis not present

## 2019-01-31 DIAGNOSIS — R112 Nausea with vomiting, unspecified: Secondary | ICD-10-CM | POA: Diagnosis not present

## 2019-01-31 DIAGNOSIS — R1084 Generalized abdominal pain: Secondary | ICD-10-CM | POA: Diagnosis not present

## 2019-01-31 DIAGNOSIS — R197 Diarrhea, unspecified: Secondary | ICD-10-CM | POA: Insufficient documentation

## 2019-01-31 DIAGNOSIS — E876 Hypokalemia: Secondary | ICD-10-CM | POA: Insufficient documentation

## 2019-01-31 DIAGNOSIS — R110 Nausea: Secondary | ICD-10-CM | POA: Diagnosis not present

## 2019-01-31 DIAGNOSIS — F1210 Cannabis abuse, uncomplicated: Secondary | ICD-10-CM | POA: Insufficient documentation

## 2019-01-31 DIAGNOSIS — Z79899 Other long term (current) drug therapy: Secondary | ICD-10-CM | POA: Insufficient documentation

## 2019-01-31 DIAGNOSIS — R1111 Vomiting without nausea: Secondary | ICD-10-CM | POA: Diagnosis not present

## 2019-01-31 LAB — URINALYSIS, COMPLETE (UACMP) WITH MICROSCOPIC
Bacteria, UA: NONE SEEN
Bilirubin Urine: NEGATIVE
Glucose, UA: NEGATIVE mg/dL
Hgb urine dipstick: NEGATIVE
Ketones, ur: 20 mg/dL — AB
Leukocytes,Ua: NEGATIVE
Nitrite: NEGATIVE
Protein, ur: 100 mg/dL — AB
Specific Gravity, Urine: 1.02 (ref 1.005–1.030)
pH: 8 (ref 5.0–8.0)

## 2019-01-31 LAB — CBC
HCT: 38.2 % (ref 36.0–46.0)
Hemoglobin: 12.8 g/dL (ref 12.0–15.0)
MCH: 27.6 pg (ref 26.0–34.0)
MCHC: 33.5 g/dL (ref 30.0–36.0)
MCV: 82.3 fL (ref 80.0–100.0)
Platelets: 306 10*3/uL (ref 150–400)
RBC: 4.64 MIL/uL (ref 3.87–5.11)
RDW: 12.7 % (ref 11.5–15.5)
WBC: 14.4 10*3/uL — ABNORMAL HIGH (ref 4.0–10.5)
nRBC: 0 % (ref 0.0–0.2)

## 2019-01-31 LAB — COMPREHENSIVE METABOLIC PANEL
ALT: 15 U/L (ref 0–44)
AST: 24 U/L (ref 15–41)
Albumin: 4.7 g/dL (ref 3.5–5.0)
Alkaline Phosphatase: 62 U/L (ref 38–126)
Anion gap: 13 (ref 5–15)
BUN: 15 mg/dL (ref 6–20)
CO2: 21 mmol/L — ABNORMAL LOW (ref 22–32)
Calcium: 9.2 mg/dL (ref 8.9–10.3)
Chloride: 106 mmol/L (ref 98–111)
Creatinine, Ser: 0.77 mg/dL (ref 0.44–1.00)
GFR calc Af Amer: >60 mL/min (ref >60)
GFR calc non Af Amer: >60 mL/min (ref >60)
Glucose, Bld: 120 mg/dL — ABNORMAL HIGH (ref 70–99)
Potassium: 2.9 mmol/L — ABNORMAL LOW (ref 3.5–5.1)
Sodium: 140 mmol/L (ref 135–145)
Total Bilirubin: 0.7 mg/dL (ref 0.3–1.2)
Total Protein: 7.8 g/dL (ref 6.5–8.1)

## 2019-01-31 LAB — LIPASE, BLOOD: Lipase: 26 U/L (ref 11–51)

## 2019-01-31 LAB — POCT PREGNANCY, URINE: Preg Test, Ur: NEGATIVE

## 2019-01-31 MED ORDER — PROMETHAZINE HCL 25 MG PO TABS
12.5000 mg | ORAL_TABLET | Freq: Once | ORAL | Status: AC
  Administered 2019-01-31: 12.5 mg via ORAL
  Filled 2019-01-31: qty 1

## 2019-01-31 MED ORDER — ONDANSETRON HCL 4 MG/2ML IJ SOLN
4.0000 mg | Freq: Once | INTRAMUSCULAR | Status: AC
  Administered 2019-01-31: 17:00:00 4 mg via INTRAVENOUS
  Filled 2019-01-31: qty 2

## 2019-01-31 MED ORDER — SODIUM CHLORIDE 0.9 % IV BOLUS
1000.0000 mL | Freq: Once | INTRAVENOUS | Status: AC
  Administered 2019-01-31: 19:00:00 1000 mL via INTRAVENOUS

## 2019-01-31 MED ORDER — DIPHENHYDRAMINE HCL 50 MG/ML IJ SOLN
12.5000 mg | Freq: Once | INTRAMUSCULAR | Status: AC
  Administered 2019-01-31: 12.5 mg via INTRAVENOUS
  Filled 2019-01-31: qty 1

## 2019-01-31 MED ORDER — PROCHLORPERAZINE EDISYLATE 10 MG/2ML IJ SOLN
10.0000 mg | Freq: Once | INTRAMUSCULAR | Status: AC
  Administered 2019-01-31: 10 mg via INTRAVENOUS
  Filled 2019-01-31: qty 2

## 2019-01-31 MED ORDER — SODIUM CHLORIDE 0.9 % IV BOLUS
1000.0000 mL | Freq: Once | INTRAVENOUS | Status: AC
  Administered 2019-01-31: 17:00:00 1000 mL via INTRAVENOUS

## 2019-01-31 MED ORDER — PROMETHAZINE HCL 12.5 MG PO TABS: 12.5000 mg | ORAL_TABLET | Freq: Four times a day (QID) | ORAL | 0 refills | Status: DC | PRN

## 2019-01-31 MED ORDER — MORPHINE SULFATE (PF) 4 MG/ML IV SOLN
4.0000 mg | Freq: Once | INTRAVENOUS | Status: AC
  Administered 2019-01-31: 4 mg via INTRAVENOUS
  Filled 2019-01-31: qty 1

## 2019-01-31 NOTE — ED Provider Notes (Signed)
[facility] Emergency Department Provider Note    First MD Initiated Contact with Patient 01/29/19 0007     (approximate)  I have reviewed the triage vital signs and the nursing notes.   HISTORY  Chief Complaint Abdominal Pain   HPI Tiffany Guzman is a 23 y.o. female with below list of previous medical conditions presents to the emergency department with left lower quadrant abdominal discomfort emesis and diarrhea.  Patient states that symptoms began today.  Patient states that sick contact at home had the same.  Patient denies any fever afebrile on presentation.  Patient denies any urinary symptoms        Past Medical History:  Diagnosis Date  . Anxiety   . Attention deficit hyperactivity disorder (ADHD)   . Chronic upper back pain   . Depression   . GERD (gastroesophageal reflux disease)   . Migraine with aura    since elementary school    Patient Active Problem List   Diagnosis Date Noted  . Severe preeclampsia, third trimester 11/15/2017  . Labor and delivery, indication for care 11/14/2017  . Gestational hypertension 11/14/2017  . Status post cesarean delivery 11/14/2017  . Dermoid cyst 10/03/2017  . Cyst, ovary, dermoid, left 10/03/2017  . Pregnancy 10/02/2017  . 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 Dermoid cyst of ovary affecting pregnancy, antepartum 09/04/2017  . Abdominal pain during pregnancy, third trimester 09/04/2017  . Left ovarian cyst 05/20/2017  . Supervision of normal first pregnancy, antepartum 04/14/2017  . Breast tenderness 06/24/2016  . Urinary incontinence 06/24/2016  . Anxiety 06/24/2016    Past Surgical History:  Procedure Laterality Date  . CESAREAN SECTION N/A 11/14/2017   Procedure: CESAREAN SECTION;  Surgeon: Will Bonnet, MD;  Location: [facility] ORS;  Service: Obstetrics;  Laterality: N/A;  . INSERTION OF NON VAGINAL CONTRACEPTIVE DEVICE    . OOPHORECTOMY Left 10/03/2017   Procedure:  OOPHORECTOMY;  Surgeon: Homero Fellers, MD;  Location: [facility] ORS;  Service: Gynecology;  Laterality: Left;  . OVARIAN CYST REMOVAL Left 10/03/2017   Procedure: OVARIAN CYSTECTOMY;  Surgeon: Homero Fellers, MD;  Location: [facility] ORS;  Service: Gynecology;  Laterality: Left;    Prior to Admission medications   Medication Sig Start Date End Date Taking? Authorizing Provider  ketorolac (TORADOL) 10 MG tablet Take 1 tablet (10 mg total) by mouth every 6 (six) hours as needed. 01/29/19   Gregor Hams, MD  NIFEdipine (PROCARDIA-XL/ADALAT-CC/NIFEDICAL-XL) 30 MG 24 hr tablet Take 3 tablets (90 mg total) by mouth daily. Patient not taking: Reported on 12/05/2017 11/18/17 02/16/18  Rod Can, CNM  norethindrone (MICRONOR,CAMILA,ERRIN) 0.35 MG tablet Take 1 tablet (0.35 mg total) by mouth daily. 01/19/18   Will Bonnet, MD  ondansetron (ZOFRAN ODT) 4 MG disintegrating tablet Take 1 tablet (4 mg total) by mouth every 8 (eight) hours as needed. 01/29/19   Gregor Hams, MD  Prenatal Vit-Fe Fumarate-FA (PRENATAL VITAMIN PLUS LOW IRON) 27-1 MG TABS Take 1 tablet by mouth daily. 08/05/17   Will Bonnet, MD    Allergies Patient has no known allergies.  Family History  Problem Relation Age of Onset  . Hypertension Mother   . Hypothyroidism Mother   . Depression Mother   . Diabetes Maternal Grandmother     Social History Social History   Tobacco Use  . Smoking status: Never Smoker  . Smokeless tobacco: Never Used  Substance Use Topics  . Alcohol use: No  Alcohol/week: 0.0 standard drinks  . Drug use: Yes    Frequency: 7.0 times per week    Types: Marijuana    Review of Systems Constitutional: No fever/chills Eyes: No visual changes. ENT: No sore throat. Cardiovascular: Denies chest pain. Respiratory: Denies shortness of breath. Gastrointestinal: Positive for abdominal pain nausea vomiting and diarrhea Genitourinary: Negative for dysuria. Musculoskeletal:  Negative for neck pain.  Negative for back pain. Integumentary: Negative for rash. Neurological: Negative for headaches, focal weakness or numbness.  ____________________________________________   PHYSICAL EXAM:  VITAL SIGNS: ED Triage Vitals  Enc Vitals Group     BP 01/28/19 2316 132/77     Pulse Rate 01/28/19 2316 (!) 117     Resp 01/28/19 2316 18     Temp 01/28/19 2316 97.7 F (36.5 C)     Temp Source 01/28/19 2316 Oral     SpO2 01/28/19 2316 100 %     Weight 01/28/19 2310 59 kg (130 lb)     Height 01/28/19 2310 1.626 m (5\' 4" )     Head Circumference --      Peak Flow --      Pain Score 01/28/19 2309 9     Pain Loc --      Pain Edu? --      Excl. in [place]? --     Constitutional: Alert and oriented. Well appearing and in no acute distress. Eyes: Conjunctivae are normal.  Mouth/Throat: Mucous membranes are moist.  Oropharynx non-erythematous. Neck: No stridor.  Cardiovascular: Normal rate, regular rhythm. Good peripheral circulation. Grossly normal heart sounds. Respiratory: Normal respiratory effort.  No retractions. No audible wheezing. Gastrointestinal: Soft and nontender. No distention.  Musculoskeletal: No lower extremity tenderness nor edema. No gross deformities of extremities. Neurologic:  Normal speech and language. No gross focal neurologic deficits are appreciated.  Skin:  Skin is warm, dry and intact. No rash noted. Psychiatric: Mood and affect are normal. Speech and behavior are normal.**}  ____________________________________________   LABS (all labs ordered are listed, but only abnormal results are displayed)  Labs Reviewed  COMPREHENSIVE METABOLIC PANEL - Abnormal; Notable for the following components:      Result Value   Glucose, Bld 106 (*)    Total Protein 8.6 (*)    Albumin 5.1 (*)    All other components within normal limits  CBC - Abnormal; Notable for the following components:   WBC 20.2 (*)    RBC 5.30 (*)    All other components within  normal limits  URINALYSIS, COMPLETE (UACMP) WITH MICROSCOPIC - Abnormal; Notable for the following components:   Color, Urine STRAW (*)    APPearance CLEAR (*)    Specific Gravity, Urine >1.046 (*)    Leukocytes,Ua TRACE (*)    All other components within normal limits  CBC - Abnormal; Notable for the following components:   WBC 15.1 (*)    Hemoglobin 11.5 (*)    HCT 34.8 (*)    All other components within normal limits  LIPASE, BLOOD  HCG, QUANTITATIVE, PREGNANCY  POC URINE PREG, ED    ____________________________________________  RADIOLOGY I, [place] N BROWN, personally viewed and evaluated these images (plain radiographs) as part of my medical decision making, as well as reviewing the written report by the radiologist.  ED MD interpretation: Normal appendix 3.8 cm right sided dermoid cyst  Official radiology report(s): No results found.    Procedures   ____________________________________________   INITIAL IMPRESSION / MDM / ASSESSMENT AND PLAN / ED COURSE  As part of my medical decision making, I reviewed the following data within the electronic MEDICAL RECORD NUMBER  23 year old female presented with above-stated history and physical exam secondary to abdominal discomfort nausea vomiting and diarrhea.  Suspect infectious etiology given known sick contact with the same.  Patient given IV morphine and Zofran in emergency department with improvement of discomfort.  In addition patient given 2 L IV normal saline.  CT scan of the abdomen pelvis revealed no acute intra-abdominal pathology with exception of a dermoid cyst on the right ovary.  Patient will be prescribed Zofran for home with recommendation to follow-up with outpatient      ____________________________________________  FINAL CLINICAL IMPRESSION(S) / ED DIAGNOSES  Final diagnoses:  Pelvic pain  Dermoid cyst of right ovary  Nausea vomiting and diarrhea     MEDICATIONS GIVEN DURING THIS VISIT:  Medications   ondansetron (ZOFRAN-ODT) 4 MG disintegrating tablet (  Given by Other 01/28/19 2346)  morphine 2 MG/ML injection 2 mg (2 mg Intravenous Given 01/29/19 0020)  ondansetron (ZOFRAN) injection 4 mg (4 mg Intravenous Given 01/29/19 0019)  sodium chloride 0.9 % bolus 1,000 mL (0 mLs Intravenous Stopped 01/29/19 0120)  morphine 2 MG/ML injection 2 mg (2 mg Intravenous Given 01/29/19 0150)  ondansetron (ZOFRAN) injection 4 mg (4 mg Intravenous Given 01/29/19 0148)  iohexol (OMNIPAQUE) 300 MG/ML solution 100 mL (100 mLs Intravenous Contrast Given 01/29/19 0235)  metoCLOPramide (REGLAN) injection 10 mg (10 mg Intravenous Given 01/29/19 0254)  sodium chloride 0.9 % bolus 1,000 mL (0 mLs Intravenous Stopped 01/29/19 0553)  metoCLOPramide (REGLAN) injection 10 mg (10 mg Intravenous Given 01/29/19 0416)  ketorolac (TORADOL) 30 MG/ML injection 30 mg (30 mg Intravenous Given 01/29/19 0416)     ED Discharge Orders         Ordered    ondansetron (ZOFRAN ODT) 4 MG disintegrating tablet  Every 8 hours PRN     01/29/19 0558    ketorolac (TORADOL) 10 MG tablet  Every 6 hours PRN     01/29/19 0558          *Please note:  Tiffany Guzman was evaluated in Emergency Department on 01/31/2019 for the symptoms described in the history of present illness. She was evaluated in the context of the global COVID-19 pandemic, which necessitated consideration that the patient might be at risk for infection with the SARS-CoV-2 virus that causes COVID-19. Institutional protocols and algorithms that pertain to the evaluation of patients at risk for COVID-19 are in a state of rapid change based on information released by regulatory bodies including the CDC and federal and state organizations. These policies and algorithms were followed during the patient's care in the ED.  Some ED evaluations and interventions may be delayed as a result of limited staffing during the pandemic.*  Note:  This document was prepared using Dragon voice  recognition software and may include unintentional dictation errors.   Gregor Hams, MD 01/31/19 Laureen Abrahams

## 2019-01-31 NOTE — ED Notes (Signed)
Pt given some ice chips and water for PO trial.

## 2019-01-31 NOTE — ED Provider Notes (Signed)
[facility] Emergency Department Provider Note  ____________________________________________   First MD Initiated Contact with Patient 01/31/19 1709     (approximate)  I have reviewed the triage vital signs and the nursing notes.   HISTORY  Chief Complaint Abdominal Pain   HPI Tiffany Guzman is a 23 y.o. female who presents to the emergency department for treatment and evaluation of vomiting. Symptoms started 2 days ago. She has a history of the same during her pregnancy last year that required emergency surgery and removal of the left ovary.     Past Medical History:  Diagnosis Date  . Anxiety   . Attention deficit hyperactivity disorder (ADHD)   . Chronic upper back pain   . Depression   . GERD (gastroesophageal reflux disease)   . Migraine with aura    since elementary school    Patient Active Problem List   Diagnosis Date Noted  . Severe preeclampsia, third trimester 11/15/2017  . Labor and delivery, indication for care 11/14/2017  . Gestational hypertension 11/14/2017  . Status post cesarean delivery 11/14/2017  . Dermoid cyst 10/03/2017  . Cyst, ovary, dermoid, left 10/03/2017  . Pregnancy 10/02/2017  . 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 Dermoid cyst of ovary affecting pregnancy, antepartum 09/04/2017  . Abdominal pain during pregnancy, third trimester 09/04/2017  . Left ovarian cyst 05/20/2017  . Supervision of normal first pregnancy, antepartum 04/14/2017  . Breast tenderness 06/24/2016  . Urinary incontinence 06/24/2016  . Anxiety 06/24/2016    Past Surgical History:  Procedure Laterality Date  . CESAREAN SECTION N/A 11/14/2017   Procedure: CESAREAN SECTION;  Surgeon: Will Bonnet, MD;  Location: [facility] ORS;  Service: Obstetrics;  Laterality: N/A;  . INSERTION OF NON VAGINAL CONTRACEPTIVE DEVICE    . OOPHORECTOMY Left 10/03/2017   Procedure: OOPHORECTOMY;  Surgeon: Homero Fellers, MD;  Location: [facility] ORS;   Service: Gynecology;  Laterality: Left;  . OVARIAN CYST REMOVAL Left 10/03/2017   Procedure: OVARIAN CYSTECTOMY;  Surgeon: Homero Fellers, MD;  Location: [facility] ORS;  Service: Gynecology;  Laterality: Left;    Prior to Admission medications   Medication Sig Start Date End Date Taking? Authorizing Provider  ketorolac (TORADOL) 10 MG tablet Take 1 tablet (10 mg total) by mouth every 6 (six) hours as needed. 01/29/19   Gregor Hams, MD  NIFEdipine (PROCARDIA-XL/ADALAT-CC/NIFEDICAL-XL) 30 MG 24 hr tablet Take 3 tablets (90 mg total) by mouth daily. Patient not taking: Reported on 12/05/2017 11/18/17 02/16/18  Rod Can, CNM  norethindrone (MICRONOR,CAMILA,ERRIN) 0.35 MG tablet Take 1 tablet (0.35 mg total) by mouth daily. 01/19/18   Will Bonnet, MD  ondansetron (ZOFRAN ODT) 4 MG disintegrating tablet Take 1 tablet (4 mg total) by mouth every 8 (eight) hours as needed. 01/29/19   Gregor Hams, MD  Prenatal Vit-Fe Fumarate-FA (PRENATAL VITAMIN PLUS LOW IRON) 27-1 MG TABS Take 1 tablet by mouth daily. 08/05/17   Will Bonnet, MD  promethazine (PHENERGAN) 12.5 MG tablet Take 1 tablet (12.5 mg total) by mouth every 6 (six) hours as needed for nausea or vomiting. 01/31/19   Sherrie George B, FNP    Allergies Patient has no known allergies.  Family History  Problem Relation Age of Onset  . Hypertension Mother   . Hypothyroidism Mother   . Depression Mother   . Diabetes Maternal Grandmother     Social History Social History   Tobacco Use  . Smoking status: Never Smoker  . Smokeless  tobacco: Never Used  Substance Use Topics  . Alcohol use: No    Alcohol/week: 0.0 standard drinks  . Drug use: Yes    Frequency: 7.0 times per week    Types: Marijuana    Review of Systems  Constitutional: No fever/chills Eyes: No visual changes. ENT: No sore throat. Cardiovascular: Denies chest pain. Respiratory: Denies shortness of breath. Gastrointestinal: Positive for  abdominal pain.  Positive for nausea, vomiting, and diarrhea. Genitourinary: Negative for dysuria. Musculoskeletal: Negative for back pain. Skin: Negative for rash. Neurological: Negative for headaches, focal weakness or numbness. ____________________________________________   PHYSICAL EXAM:  VITAL SIGNS: ED Triage Vitals  Enc Vitals Group     BP 01/31/19 1646 (!) 124/97     Pulse Rate 01/31/19 1646 97     Resp 01/31/19 1646 16     Temp 01/31/19 1646 (!) 97.1 F (36.2 C)     Temp Source 01/31/19 1646 Axillary     SpO2 01/31/19 1646 100 %     Weight 01/31/19 1645 130 lb (59 kg)     Height 01/31/19 1645 5\' 4"  (1.626 m)     Head Circumference --      Peak Flow --      Pain Score 01/31/19 1645 10     Pain Loc --      Pain Edu? --      Excl. in [place]? --     Constitutional: Alert and oriented. Acutely ill appearing. Eyes: Conjunctivae are normal. PERRL. EOMI. Head: Atraumatic. Nose: No congestion/rhinnorhea. Mouth/Throat: Mucous membranes are moist.  Oropharynx non-erythematous. Neck: No stridor.   Cardiovascular: Normal rate, regular rhythm. Grossly normal heart sounds.  Good peripheral circulation. Respiratory: Normal respiratory effort.  No retractions. Lungs CTAB. Gastrointestinal: Soft. No focal tenderness. No distention. No abdominal bruits. No CVA tenderness. Musculoskeletal: No lower extremity tenderness nor edema.  No joint effusions. Neurologic:  Normal speech and language. No gross focal neurologic deficits are appreciated. No gait instability. Skin:  Skin is pale. No rash noted. Psychiatric: Mood and affect are normal. Speech and behavior are normal.  ____________________________________________   LABS (all labs ordered are listed, but only abnormal results are displayed)  Labs Reviewed  COMPREHENSIVE METABOLIC PANEL - Abnormal; Notable for the following components:      Result Value   Potassium 2.9 (*)    CO2 21 (*)    Glucose, Bld 120 (*)    All other  components within normal limits  CBC - Abnormal; Notable for the following components:   WBC 14.4 (*)    All other components within normal limits  URINALYSIS, COMPLETE (UACMP) WITH MICROSCOPIC - Abnormal; Notable for the following components:   Color, Urine YELLOW (*)    APPearance CLOUDY (*)    Ketones, ur 20 (*)    Protein, ur 100 (*)    All other components within normal limits  LIPASE, BLOOD  POC URINE PREG, ED  POCT PREGNANCY, URINE   ____________________________________________  EKG   ____________________________________________  RADIOLOGY  ED MD interpretation:  Not indicated.  Official radiology report(s): No results found.  ____________________________________________   PROCEDURES  Procedure(s) performed: None  Procedures  Critical Care performed: No  ____________________________________________   INITIAL IMPRESSION / ASSESSMENT AND PLAN / ED COURSE  23 year old female presenting to the emergency department for persistent nausea and vomiting.  She was evaluated here for the same 2 days ago.  She states that she had improved somewhat yesterday, but overnight began to vomit.  She states that the  diarrhea has lessened since her last visit here.  Upon review of her last ER visit, pelvic ultrasound showed a dermoid cyst of the right ovary and normal appearance of the uterus.  There was normal arterial and venous flow to the right ovary.  CT of the abdomen and pelvis with contrast again shows the ovarian lesion, normal appendix, no hydronephrosis or bowel obstruction.  In route, patient had no relief of nausea and vomiting after receiving Zofran.  Plan will be to give her fluid bolus and a second dose of Zofran and reevaluate.  ----------------------------------------- 5:45 PM on 01/31/2019 -----------------------------------------  Patient continues to have vomiting after fluids and Zofran.  Compazine and Benadryl  ordered.  ----------------------------------------- 7:54 PM on 01/31/2019 -----------------------------------------  After 2 L of IV fluids, Compazine, and Benadryl feels that she is able to tolerate p.o. fluids.  Ice chips provided and she has tolerated those well.  She will be discharged home with a prescription for Phenergan.  She is to call her primary care provider in the morning for follow-up.  She was advised that if she begins to have uncontrollable vomiting again overnight that she should return to the emergency department.  She does have very mild hypokalemia which should correct by diet now that she is tolerating po. ____________________________________________   FINAL CLINICAL IMPRESSION(S) / ED DIAGNOSES  Final diagnoses:  Nausea vomiting and diarrhea  Hypokalemia     ED Discharge Orders         Ordered    promethazine (PHENERGAN) 12.5 MG tablet  Every 6 hours PRN     01/31/19 1941           Note:  This document was prepared using Dragon voice recognition software and may include unintentional dictation errors.    Victorino Dike, FNP 01/31/19 Lona Kettle    Lavonia Drafts, MD 01/31/19 2119

## 2019-01-31 NOTE — ED Triage Notes (Signed)
Pt comes EMS from home with lower abdominal pain. Pt shaking and vomitting profusely. Zofran given EMS but no effects. Pt has hx of cysts with emergency surgery to remove one over a year ago.

## 2019-01-31 NOTE — Discharge Instructions (Addendum)
Please take the phenergan as prescribed. Be aware it will make you sleepy.   Follow up with primary care if not improving over the next 24 hours.   Return to the ER if you begin vomiting uncontrollably again.

## 2019-02-02 ENCOUNTER — Observation Stay
Admission: EM | Admit: 2019-02-02 | Discharge: 2019-02-03 | Disposition: A | Discharge status: Home / Self Care | Payer: Medicaid Other | Attending: Specialist | Admitting: Specialist

## 2019-02-02 ENCOUNTER — Observation Stay: Payer: Medicaid Other

## 2019-02-02 ENCOUNTER — Encounter: Payer: Self-pay | Admitting: Emergency Medicine

## 2019-02-02 ENCOUNTER — Telehealth: Payer: Self-pay

## 2019-02-02 ENCOUNTER — Other Ambulatory Visit: Payer: Self-pay

## 2019-02-02 DIAGNOSIS — R197 Diarrhea, unspecified: Secondary | ICD-10-CM

## 2019-02-02 DIAGNOSIS — F909 Attention-deficit hyperactivity disorder, unspecified type: Secondary | ICD-10-CM | POA: Insufficient documentation

## 2019-02-02 DIAGNOSIS — E876 Hypokalemia: Secondary | ICD-10-CM | POA: Diagnosis not present

## 2019-02-02 DIAGNOSIS — R112 Nausea with vomiting, unspecified: Secondary | ICD-10-CM | POA: Diagnosis present

## 2019-02-02 DIAGNOSIS — D369 Benign neoplasm, unspecified site: Secondary | ICD-10-CM | POA: Diagnosis not present

## 2019-02-02 DIAGNOSIS — D72829 Elevated white blood cell count, unspecified: Secondary | ICD-10-CM | POA: Diagnosis not present

## 2019-02-02 DIAGNOSIS — E860 Dehydration: Secondary | ICD-10-CM | POA: Insufficient documentation

## 2019-02-02 DIAGNOSIS — Z1159 Encounter for screening for other viral diseases: Secondary | ICD-10-CM | POA: Insufficient documentation

## 2019-02-02 DIAGNOSIS — F419 Anxiety disorder, unspecified: Secondary | ICD-10-CM | POA: Insufficient documentation

## 2019-02-02 DIAGNOSIS — D270 Benign neoplasm of right ovary: Secondary | ICD-10-CM | POA: Diagnosis not present

## 2019-02-02 DIAGNOSIS — Z791 Long term (current) use of non-steroidal anti-inflammatories (NSAID): Secondary | ICD-10-CM | POA: Diagnosis not present

## 2019-02-02 DIAGNOSIS — Z818 Family history of other mental and behavioral disorders: Secondary | ICD-10-CM | POA: Insufficient documentation

## 2019-02-02 DIAGNOSIS — N83201 Unspecified ovarian cyst, right side: Secondary | ICD-10-CM

## 2019-02-02 DIAGNOSIS — K529 Noninfective gastroenteritis and colitis, unspecified: Principal | ICD-10-CM | POA: Insufficient documentation

## 2019-02-02 DIAGNOSIS — K219 Gastro-esophageal reflux disease without esophagitis: Secondary | ICD-10-CM | POA: Diagnosis not present

## 2019-02-02 DIAGNOSIS — F329 Major depressive disorder, single episode, unspecified: Secondary | ICD-10-CM | POA: Insufficient documentation

## 2019-02-02 DIAGNOSIS — R1084 Generalized abdominal pain: Secondary | ICD-10-CM | POA: Diagnosis not present

## 2019-02-02 DIAGNOSIS — N839 Noninflammatory disorder of ovary, fallopian tube and broad ligament, unspecified: Secondary | ICD-10-CM | POA: Diagnosis not present

## 2019-02-02 DIAGNOSIS — G43909 Migraine, unspecified, not intractable, without status migrainosus: Secondary | ICD-10-CM | POA: Diagnosis not present

## 2019-02-02 DIAGNOSIS — R1110 Vomiting, unspecified: Secondary | ICD-10-CM

## 2019-02-02 DIAGNOSIS — R109 Unspecified abdominal pain: Secondary | ICD-10-CM

## 2019-02-02 DIAGNOSIS — Z79899 Other long term (current) drug therapy: Secondary | ICD-10-CM | POA: Diagnosis not present

## 2019-02-02 LAB — URINE DRUG SCREEN, QUALITATIVE (ARMC ONLY)
Amphetamines, Ur Screen: NOT DETECTED
Barbiturates, Ur Screen: NOT DETECTED
Benzodiazepine, Ur Scrn: NOT DETECTED
Cannabinoid 50 Ng, Ur ~~LOC~~: POSITIVE — AB
Cocaine Metabolite,Ur ~~LOC~~: NOT DETECTED
MDMA (Ecstasy)Ur Screen: NOT DETECTED
Methadone Scn, Ur: NOT DETECTED
Opiate, Ur Screen: POSITIVE — AB
Phencyclidine (PCP) Ur S: NOT DETECTED
Tricyclic, Ur Screen: NOT DETECTED

## 2019-02-02 LAB — URINALYSIS, COMPLETE (UACMP) WITH MICROSCOPIC
Bacteria, UA: NONE SEEN
Bilirubin Urine: NEGATIVE
Glucose, UA: >=500 mg/dL — AB
Hgb urine dipstick: NEGATIVE
Ketones, ur: 5 mg/dL — AB
Leukocytes,Ua: NEGATIVE
Nitrite: NEGATIVE
Protein, ur: NEGATIVE mg/dL
Specific Gravity, Urine: 1.006 (ref 1.005–1.030)
pH: 7 (ref 5.0–8.0)

## 2019-02-02 LAB — COMPREHENSIVE METABOLIC PANEL
ALT: 16 U/L (ref 0–44)
AST: 21 U/L (ref 15–41)
Albumin: 4.4 g/dL (ref 3.5–5.0)
Alkaline Phosphatase: 62 U/L (ref 38–126)
Anion gap: 13 (ref 5–15)
BUN: 15 mg/dL (ref 6–20)
CO2: 23 mmol/L (ref 22–32)
Calcium: 9.4 mg/dL (ref 8.9–10.3)
Chloride: 105 mmol/L (ref 98–111)
Creatinine, Ser: 0.9 mg/dL (ref 0.44–1.00)
GFR calc Af Amer: >60 mL/min (ref >60)
GFR calc non Af Amer: >60 mL/min (ref >60)
Glucose, Bld: 105 mg/dL — ABNORMAL HIGH (ref 70–99)
Potassium: 2.8 mmol/L — ABNORMAL LOW (ref 3.5–5.1)
Sodium: 141 mmol/L (ref 135–145)
Total Bilirubin: 0.8 mg/dL (ref 0.3–1.2)
Total Protein: 7.3 g/dL (ref 6.5–8.1)

## 2019-02-02 LAB — CBC WITH DIFFERENTIAL/PLATELET
Abs Immature Granulocytes: 0.06 10*3/uL (ref 0.00–0.07)
Basophils Absolute: 0.1 10*3/uL (ref 0.0–0.1)
Basophils Relative: 0 %
Eosinophils Absolute: 0.1 10*3/uL (ref 0.0–0.5)
Eosinophils Relative: 0 %
HCT: 37.3 % (ref 36.0–46.0)
Hemoglobin: 12.5 g/dL (ref 12.0–15.0)
Immature Granulocytes: 0 %
Lymphocytes Relative: 19 %
Lymphs Abs: 2.9 10*3/uL (ref 0.7–4.0)
MCH: 28 pg (ref 26.0–34.0)
MCHC: 33.5 g/dL (ref 30.0–36.0)
MCV: 83.6 fL (ref 80.0–100.0)
Monocytes Absolute: 1 10*3/uL (ref 0.1–1.0)
Monocytes Relative: 7 %
Neutro Abs: 11.1 10*3/uL — ABNORMAL HIGH (ref 1.7–7.7)
Neutrophils Relative %: 74 %
Platelets: 283 10*3/uL (ref 150–400)
RBC: 4.46 MIL/uL (ref 3.87–5.11)
RDW: 12.9 % (ref 11.5–15.5)
WBC: 15.2 10*3/uL — ABNORMAL HIGH (ref 4.0–10.5)
nRBC: 0 % (ref 0.0–0.2)

## 2019-02-02 LAB — CHLAMYDIA/NGC RT PCR (ARMC ONLY)
Chlamydia Tr: NOT DETECTED
N gonorrhoeae: NOT DETECTED

## 2019-02-02 LAB — TSH: TSH: 2.982 u[IU]/mL (ref 0.350–4.500)

## 2019-02-02 LAB — SARS CORONAVIRUS 2 BY RT PCR (HOSPITAL ORDER, PERFORMED IN ~~LOC~~ HOSPITAL LAB): SARS Coronavirus 2: NEGATIVE

## 2019-02-02 LAB — POTASSIUM: Potassium: 3.7 mmol/L (ref 3.5–5.1)

## 2019-02-02 LAB — LIPASE, BLOOD: Lipase: 27 U/L (ref 11–51)

## 2019-02-02 MED ORDER — ONDANSETRON HCL 4 MG/2ML IJ SOLN
4.0000 mg | Freq: Four times a day (QID) | INTRAMUSCULAR | Status: DC | PRN
  Administered 2019-02-02 (×2): 4 mg via INTRAVENOUS
  Filled 2019-02-02 (×2): qty 2

## 2019-02-02 MED ORDER — DEXTROSE-NACL 5-0.45 % IV SOLN
INTRAVENOUS | Status: DC
  Administered 2019-02-02: 06:00:00 via INTRAVENOUS

## 2019-02-02 MED ORDER — PROMETHAZINE HCL 25 MG/ML IJ SOLN
12.5000 mg | Freq: Once | INTRAMUSCULAR | Status: AC
  Administered 2019-02-02: 12.5 mg via INTRAVENOUS
  Filled 2019-02-02: qty 1

## 2019-02-02 MED ORDER — PROCHLORPERAZINE EDISYLATE 10 MG/2ML IJ SOLN
5.0000 mg | Freq: Once | INTRAMUSCULAR | Status: AC
  Administered 2019-02-02: 5 mg via INTRAVENOUS
  Filled 2019-02-02: qty 2

## 2019-02-02 MED ORDER — FENTANYL CITRATE (PF) 100 MCG/2ML IJ SOLN
50.0000 ug | Freq: Once | INTRAMUSCULAR | Status: AC
  Administered 2019-02-02: 50 ug via INTRAVENOUS
  Filled 2019-02-02: qty 2

## 2019-02-02 MED ORDER — MORPHINE SULFATE (PF) 2 MG/ML IV SOLN
2.0000 mg | Freq: Once | INTRAVENOUS | Status: AC
  Administered 2019-02-02: 2 mg via INTRAVENOUS
  Filled 2019-02-02: qty 1

## 2019-02-02 MED ORDER — DOCUSATE SODIUM 100 MG PO CAPS: 100.0000 mg | ORAL_CAPSULE | Freq: Two times a day (BID) | ORAL | Status: DC

## 2019-02-02 MED ORDER — POTASSIUM CHLORIDE 10 MEQ/100ML IV SOLN: 10.0000 meq | Freq: Once | INTRAVENOUS | Status: DC

## 2019-02-02 MED ORDER — ONDANSETRON HCL 4 MG/2ML IJ SOLN
4.0000 mg | Freq: Four times a day (QID) | INTRAMUSCULAR | Status: DC | PRN
  Administered 2019-02-02 – 2019-02-03 (×3): 4 mg via INTRAVENOUS
  Filled 2019-02-02 (×3): qty 2

## 2019-02-02 MED ORDER — ACETAMINOPHEN 650 MG RE SUPP: 650.0000 mg | Freq: Four times a day (QID) | RECTAL | Status: DC | PRN

## 2019-02-02 MED ORDER — POTASSIUM CHLORIDE IN NACL 40-0.9 MEQ/L-% IV SOLN
INTRAVENOUS | Status: DC
  Administered 2019-02-02 – 2019-02-03 (×3): 100 mL/h via INTRAVENOUS
  Filled 2019-02-02 (×6): qty 1000

## 2019-02-02 MED ORDER — PROMETHAZINE HCL 25 MG/ML IJ SOLN
12.5000 mg | Freq: Four times a day (QID) | INTRAMUSCULAR | Status: DC | PRN
  Administered 2019-02-02: 12.5 mg via INTRAVENOUS
  Filled 2019-02-02: qty 1

## 2019-02-02 MED ORDER — MORPHINE SULFATE (PF) 2 MG/ML IV SOLN
1.0000 mg | INTRAVENOUS | Status: DC | PRN
  Administered 2019-02-02 – 2019-02-03 (×6): 2 mg via INTRAVENOUS
  Filled 2019-02-02 (×5): qty 1

## 2019-02-02 MED ORDER — HYDROMORPHONE HCL 1 MG/ML IJ SOLN
1.0000 mg | Freq: Once | INTRAMUSCULAR | Status: AC
  Administered 2019-02-02: 1 mg via INTRAVENOUS
  Filled 2019-02-02: qty 1

## 2019-02-02 MED ORDER — SODIUM CHLORIDE 0.9 % IV BOLUS
1000.0000 mL | Freq: Once | INTRAVENOUS | Status: AC
  Administered 2019-02-02: 1000 mL via INTRAVENOUS

## 2019-02-02 MED ORDER — DIPHENHYDRAMINE HCL 50 MG/ML IJ SOLN
12.5000 mg | Freq: Once | INTRAMUSCULAR | Status: AC
  Administered 2019-02-02: 12.5 mg via INTRAVENOUS
  Filled 2019-02-02: qty 1

## 2019-02-02 MED ORDER — ENOXAPARIN SODIUM 40 MG/0.4ML ~~LOC~~ SOLN
40.0000 mg | SUBCUTANEOUS | Status: DC
  Administered 2019-02-02 – 2019-02-03 (×2): 40 mg via SUBCUTANEOUS
  Filled 2019-02-02 (×2): qty 0.4

## 2019-02-02 MED ORDER — ONDANSETRON HCL 4 MG PO TABS: 4.0000 mg | ORAL_TABLET | Freq: Four times a day (QID) | ORAL | Status: DC | PRN

## 2019-02-02 MED ORDER — PROMETHAZINE HCL 25 MG/ML IJ SOLN
25.0000 mg | Freq: Four times a day (QID) | INTRAMUSCULAR | Status: DC | PRN
  Administered 2019-02-03 (×3): 25 mg via INTRAVENOUS
  Filled 2019-02-02 (×3): qty 1

## 2019-02-02 MED ORDER — POTASSIUM CHLORIDE 10 MEQ/100ML IV SOLN
10.0000 meq | INTRAVENOUS | Status: AC
  Administered 2019-02-02 (×4): 10 meq via INTRAVENOUS
  Filled 2019-02-02 (×5): qty 100

## 2019-02-02 MED ORDER — PROCHLORPERAZINE EDISYLATE 10 MG/2ML IJ SOLN
10.0000 mg | Freq: Four times a day (QID) | INTRAMUSCULAR | Status: DC | PRN
  Administered 2019-02-02 (×2): 10 mg via INTRAVENOUS
  Filled 2019-02-02 (×3): qty 2

## 2019-02-02 MED ORDER — ONDANSETRON HCL 4 MG/2ML IJ SOLN
4.0000 mg | Freq: Once | INTRAMUSCULAR | Status: AC
  Administered 2019-02-02: 4 mg via INTRAVENOUS

## 2019-02-02 MED ORDER — ONDANSETRON HCL 4 MG/2ML IJ SOLN
INTRAMUSCULAR | Status: AC
  Administered 2019-02-02: 4 mg via INTRAVENOUS
  Filled 2019-02-02: qty 2

## 2019-02-02 MED ORDER — ACETAMINOPHEN 325 MG PO TABS
650.0000 mg | ORAL_TABLET | Freq: Four times a day (QID) | ORAL | Status: DC | PRN
  Filled 2019-02-02: qty 2

## 2019-02-02 NOTE — Telephone Encounter (Signed)
Pt calling from [facility] ED; pretty sure she has an ovarian cyst that needs to come out.  ED doc said he was going to talk to another doctor.  She is tired of sitting there in pain.  Needs CB ASAP.  Wants to know what we can do about it?  [phone]  VM not set up.  Was going to adv pt she is where she needs to be which is currently admitted ot observation on general surgery floor.  Was also going to adv her she can request for [place] to do the surgery - AMS on call.

## 2019-02-02 NOTE — ED Triage Notes (Signed)
Pt to triage via w/c, mask in place; reports awoke hr PTA with generalized abd pain accomp by N/V; st seen for same recently and thinks "it's her ovaries"

## 2019-02-02 NOTE — ED Notes (Signed)
ED TO INPATIENT HANDOFF REPORT  ED Nurse Name and Phone #: Terri Piedra [phone]  S Name/Age/Gender Tiffany Guzman 23 y.o. female Room/Bed: ED19A/ED19A  Code Status   Code Status: Prior  Home/SNF/Other Home Patient oriented to: self, place, time and situation Is this baseline? Yes   Triage Complete: Triage complete  Chief Complaint Abdominal pain  Triage Note Pt to triage via w/c, mask in place; reports awoke hr PTA with generalized abd pain accomp by N/V; st seen for same recently and thinks "it's her ovaries"   Allergies No Known Allergies  Level of Care/Admitting Diagnosis ED Disposition    ED Disposition Condition [place]: [place] [100120]  Level of Care: Med-Surg [16]  Covid Evaluation: Asymptomatic Screening Protocol (No Symptoms)  Diagnosis: Intractable nausea and vomiting [035009]  Admitting Physician: Harrie Foreman [[id number]]  Attending Physician: Harrie Foreman [phone]  PT Class (Do Not Modify): Observation [104]  PT Acc Code (Do Not Modify): Observation [10022]       B Medical/Surgery History Past Medical History:  Diagnosis Date  . Anxiety   . Attention deficit hyperactivity disorder (ADHD)   . Chronic upper back pain   . Depression   . GERD (gastroesophageal reflux disease)   . Migraine with aura    since elementary school   Past Surgical History:  Procedure Laterality Date  . CESAREAN SECTION N/A 11/14/2017   Procedure: CESAREAN SECTION;  Surgeon: Will Bonnet, MD;  Location: [facility] ORS;  Service: Obstetrics;  Laterality: N/A;  . INSERTION OF NON VAGINAL CONTRACEPTIVE DEVICE    . OOPHORECTOMY Left 10/03/2017   Procedure: OOPHORECTOMY;  Surgeon: Homero Fellers, MD;  Location: [facility] ORS;  Service: Gynecology;  Laterality: Left;  . OVARIAN CYST REMOVAL Left 10/03/2017   Procedure: OVARIAN CYSTECTOMY;  Surgeon: Homero Fellers, MD;  Location: [facility] ORS;  Service: Gynecology;   Laterality: Left;     A IV Location/Drains/Wounds Patient Lines/Drains/Airways Status   Active Line/Drains/Airways    Name:   Placement date:   Placement time:   Site:   Days:   Peripheral IV 02/02/19 Left Antecubital   02/02/19    0355    Antecubital   less than 1          Intake/Output Last 24 hours No intake or output data in the 24 hours ending 02/02/19 0507  Labs/Imaging Results for orders placed or performed during the hospital encounter of 02/02/19 (from the past 48 hour(s))  CBC with Differential     Status: Abnormal   Collection Time: 02/02/19  3:21 AM  Result Value Ref Range   WBC 15.2 (H) 4.0 - 10.5 K/uL   RBC 4.46 3.87 - 5.11 MIL/uL   Hemoglobin 12.5 12.0 - 15.0 g/dL   HCT 37.3 36.0 - 46.0 %   MCV 83.6 80.0 - 100.0 fL   MCH 28.0 26.0 - 34.0 pg   MCHC 33.5 30.0 - 36.0 g/dL   RDW 12.9 11.5 - 15.5 %   Platelets 283 150 - 400 K/uL   nRBC 0.0 0.0 - 0.2 %   Neutrophils Relative % 74 %   Neutro Abs 11.1 (H) 1.7 - 7.7 K/uL   Lymphocytes Relative 19 %   Lymphs Abs 2.9 0.7 - 4.0 K/uL   Monocytes Relative 7 %   Monocytes Absolute 1.0 0.1 - 1.0 K/uL   Eosinophils Relative 0 %   Eosinophils Absolute 0.1 0.0 - 0.5 K/uL   Basophils Relative 0 %  Basophils Absolute 0.1 0.0 - 0.1 K/uL   Immature Granulocytes 0 %   Abs Immature Granulocytes 0.06 0.00 - 0.07 K/uL    Comment: Performed at [facility], [address] [place], [place] [address]  Comprehensive metabolic panel     Status: Abnormal   Collection Time: 02/02/19  3:21 AM  Result Value Ref Range   Sodium 141 135 - 145 mmol/L   Potassium 2.8 (L) 3.5 - 5.1 mmol/L   Chloride 105 98 - 111 mmol/L   CO2 23 22 - 32 mmol/L   Glucose, Bld 105 (H) 70 - 99 mg/dL   BUN 15 6 - 20 mg/dL   Creatinine, Ser 0.90 0.44 - 1.00 mg/dL   Calcium 9.4 8.9 - 10.3 mg/dL   Total Protein 7.3 6.5 - 8.1 g/dL   Albumin 4.4 3.5 - 5.0 g/dL   AST 21 15 - 41 U/L   ALT 16 0 - 44 U/L   Alkaline Phosphatase 62 38 - 126 U/L   Total  Bilirubin 0.8 0.3 - 1.2 mg/dL   GFR calc non Af Amer >60 >60 mL/min   GFR calc Af Amer >60 >60 mL/min   Anion gap 13 5 - 15    Comment: Performed at [facility], [address] [place], [place] [address]  Lipase, blood     Status: None   Collection Time: 02/02/19  3:21 AM  Result Value Ref Range   Lipase 27 11 - 51 U/L    Comment: Performed at [facility], [address] [place], [place] [address]   No results found.  Pending Labs [employer] (From admission, onward)    Start     Ordered   02/02/19 0457  Urine Drug Screen, Qualitative  Once,   STAT     02/02/19 0456   02/02/19 0403  C difficile quick scan w PCR reflex  (C Difficile quick screen w PCR reflex panel)  Once, for 24 hours,   STAT     02/02/19 0402   02/02/19 0403  Gastrointestinal Panel by PCR , Stool  (Gastrointestinal Panel by PCR, Stool)  Once,   STAT     02/02/19 0402   02/02/19 0402  SARS Coronavirus 2 (CEPHEID - Performed in [place] hospital lab), Hosp Order  (Asymptomatic Patients Labs)  Once,   STAT    Question:  Rule Out  Answer:  Yes   02/02/19 0401   02/02/19 0400  Urinalysis, Complete w Microscopic  Once,   STAT     02/02/19 0400   Signed and Held  Creatinine, serum  (enoxaparin (LOVENOX)    CrCl >/= 30 ml/min)  Weekly,   R    Comments: while on enoxaparin therapy    Signed and Held   Signed and Held  TSH  Add-on,   R     Signed and Held          Vitals/Pain Today's Vitals   02/02/19 0319 02/02/19 0320  BP: (!) 160/93   Pulse: 94   Resp: 18   Temp: 98.6 F (37 C)   TempSrc: Oral   SpO2: 100%   Weight: 59 kg 59 kg  Height: 5\' 4"  (1.626 m) 5\' 4"  (1.626 m)  PainSc: 10-Worst pain ever     Isolation Precautions Enteric precautions (UV disinfection)  Medications Medications  dextrose 5 %-0.45 % sodium chloride infusion (has no administration in time range)  potassium chloride 10 mEq in 100 mL IVPB (has no administration  in time range)  ondansetron (ZOFRAN)  injection 4 mg (4 mg Intravenous Given 02/02/19 0354)  promethazine (PHENERGAN) injection 12.5 mg (12.5 mg Intravenous Given 02/02/19 0417)  sodium chloride 0.9 % bolus 1,000 mL (1,000 mLs Intravenous New Bag/Given 02/02/19 0420)  fentaNYL (SUBLIMAZE) injection 50 mcg (50 mcg Intravenous Given 02/02/19 0420)  prochlorperazine (COMPAZINE) injection 5 mg (5 mg Intravenous Given 02/02/19 0501)  diphenhydrAMINE (BENADRYL) injection 12.5 mg (12.5 mg Intravenous Given 02/02/19 0501)    Mobility walks     Focused Assessments GI   R Recommendations: See Admitting Provider Note  Report given to:   Additional Notes:

## 2019-02-02 NOTE — Plan of Care (Signed)
Pain and nausea have continued to be and issue for the patient. On IV fluids with potassium. OBGYN consulted. Vomited 2 times earlier in the day today. Voiding find. NO c. Diff. GI panel results pending.  Problem: Education: Goal: Knowledge of General Education information will improve Description: Including pain rating scale, medication(s)/side effects and non-pharmacologic comfort measures Outcome: Adequate for Discharge   Problem: Health Behavior/Discharge Planning: Goal: Ability to manage health-related needs will improve Outcome: Adequate for Discharge   Problem: Clinical Measurements: Goal: Ability to maintain clinical measurements within normal limits will improve Outcome: Adequate for Discharge Goal: Will remain free from infection Outcome: Adequate for Discharge Goal: Diagnostic test results will improve Outcome: Adequate for Discharge Goal: Respiratory complications will improve Outcome: Adequate for Discharge Goal: Cardiovascular complication will be avoided Outcome: Adequate for Discharge   Problem: Activity: Goal: Risk for activity intolerance will decrease Outcome: Adequate for Discharge   Problem: Nutrition: Goal: Adequate nutrition will be maintained Outcome: Adequate for Discharge   Problem: Coping: Goal: Level of anxiety will decrease Outcome: Adequate for Discharge   Problem: Elimination: Goal: Will not experience complications related to bowel motility Outcome: Adequate for Discharge Goal: Will not experience complications related to urinary retention Outcome: Adequate for Discharge   Problem: Pain Managment: Goal: General experience of comfort will improve Outcome: Adequate for Discharge   Problem: Safety: Goal: Ability to remain free from injury will improve Outcome: Adequate for Discharge   Problem: Skin Integrity: Goal: Risk for impaired skin integrity will decrease Outcome: Adequate for Discharge

## 2019-02-02 NOTE — H&P (Signed)
Tiffany Guzman is an 23 y.o. female.   Chief Complaint: Abdominal pain HPI: The patient with past medical history of migraines, anxiety and depression presents to the emergency department complaining of abdominal pain, nausea and vomiting.  The patient has had all 3 symptoms in varying degrees for at least 3 days.  She denies blood or bile in her emesis.  Patient denies diarrhea and fever.  After multiple doses of antiemetics and pain medications in the emergency department the patient continued to have pain and inability to hold down water and food which prompted the emergency department staff to call the hospitalist service for admission.  Past Medical History:  Diagnosis Date  . Anxiety   . Attention deficit hyperactivity disorder (ADHD)   . Chronic upper back pain   . Depression   . GERD (gastroesophageal reflux disease)   . Migraine with aura    since elementary school    Past Surgical History:  Procedure Laterality Date  . CESAREAN SECTION N/A 11/14/2017   Procedure: CESAREAN SECTION;  Surgeon: Will Bonnet, MD;  Location: [facility] ORS;  Service: Obstetrics;  Laterality: N/A;  . INSERTION OF NON VAGINAL CONTRACEPTIVE DEVICE    . OOPHORECTOMY Left 10/03/2017   Procedure: OOPHORECTOMY;  Surgeon: Homero Fellers, MD;  Location: [facility] ORS;  Service: Gynecology;  Laterality: Left;  . OVARIAN CYST REMOVAL Left 10/03/2017   Procedure: OVARIAN CYSTECTOMY;  Surgeon: Homero Fellers, MD;  Location: [facility] ORS;  Service: Gynecology;  Laterality: Left;    Family History  Problem Relation Age of Onset  . Hypertension Mother   . Hypothyroidism Mother   . Depression Mother   . Diabetes Maternal Grandmother    Social History:  reports that she has never smoked. She has never used smokeless tobacco. She reports current drug use. Frequency: 7.00 times per week. Drug: Marijuana. She reports that she does not drink alcohol.  Allergies: No Known Allergies  Medications Prior to  Admission  Medication Sig Dispense Refill  . ketorolac (TORADOL) 10 MG tablet Take 1 tablet (10 mg total) by mouth every 6 (six) hours as needed. 20 tablet 0  . NIFEdipine (PROCARDIA-XL/ADALAT-CC/NIFEDICAL-XL) 30 MG 24 hr tablet Take 3 tablets (90 mg total) by mouth daily. (Patient not taking: Reported on 12/05/2017) 90 tablet 2  . norethindrone (MICRONOR,CAMILA,ERRIN) 0.35 MG tablet Take 1 tablet (0.35 mg total) by mouth daily. 3 Package 4  . ondansetron (ZOFRAN ODT) 4 MG disintegrating tablet Take 1 tablet (4 mg total) by mouth every 8 (eight) hours as needed. 20 tablet 0  . Prenatal Vit-Fe Fumarate-FA (PRENATAL VITAMIN PLUS LOW IRON) 27-1 MG TABS Take 1 tablet by mouth daily. 30 tablet 5  . promethazine (PHENERGAN) 12.5 MG tablet Take 1 tablet (12.5 mg total) by mouth every 6 (six) hours as needed for nausea or vomiting. 30 tablet 0    Results for orders placed or performed during the hospital encounter of 02/02/19 (from the past 48 hour(s))  CBC with Differential     Status: Abnormal   Collection Time: 02/02/19  3:21 AM  Result Value Ref Range   WBC 15.2 (H) 4.0 - 10.5 K/uL   RBC 4.46 3.87 - 5.11 MIL/uL   Hemoglobin 12.5 12.0 - 15.0 g/dL   HCT 37.3 36.0 - 46.0 %   MCV 83.6 80.0 - 100.0 fL   MCH 28.0 26.0 - 34.0 pg   MCHC 33.5 30.0 - 36.0 g/dL   RDW 12.9 11.5 - 15.5 %   Platelets 283  150 - 400 K/uL   nRBC 0.0 0.0 - 0.2 %   Neutrophils Relative % 74 %   Neutro Abs 11.1 (H) 1.7 - 7.7 K/uL   Lymphocytes Relative 19 %   Lymphs Abs 2.9 0.7 - 4.0 K/uL   Monocytes Relative 7 %   Monocytes Absolute 1.0 0.1 - 1.0 K/uL   Eosinophils Relative 0 %   Eosinophils Absolute 0.1 0.0 - 0.5 K/uL   Basophils Relative 0 %   Basophils Absolute 0.1 0.0 - 0.1 K/uL   Immature Granulocytes 0 %   Abs Immature Granulocytes 0.06 0.00 - 0.07 K/uL    Comment: Performed at [facility], [address] [place], [place] [address]  Comprehensive metabolic panel     Status: Abnormal   Collection Time:  02/02/19  3:21 AM  Result Value Ref Range   Sodium 141 135 - 145 mmol/L   Potassium 2.8 (L) 3.5 - 5.1 mmol/L   Chloride 105 98 - 111 mmol/L   CO2 23 22 - 32 mmol/L   Glucose, Bld 105 (H) 70 - 99 mg/dL   BUN 15 6 - 20 mg/dL   Creatinine, Ser 0.90 0.44 - 1.00 mg/dL   Calcium 9.4 8.9 - 10.3 mg/dL   Total Protein 7.3 6.5 - 8.1 g/dL   Albumin 4.4 3.5 - 5.0 g/dL   AST 21 15 - 41 U/L   ALT 16 0 - 44 U/L   Alkaline Phosphatase 62 38 - 126 U/L   Total Bilirubin 0.8 0.3 - 1.2 mg/dL   GFR calc non Af Amer >60 >60 mL/min   GFR calc Af Amer >60 >60 mL/min   Anion gap 13 5 - 15    Comment: Performed at [facility], [address] [place], [place] [address]  Lipase, blood     Status: None   Collection Time: 02/02/19  3:21 AM  Result Value Ref Range   Lipase 27 11 - 51 U/L    Comment: Performed at [facility], [address] [place], [place] [address]  TSH     Status: None   Collection Time: 02/02/19  3:21 AM  Result Value Ref Range   TSH 2.982 0.350 - 4.500 uIU/mL    Comment: Performed by a 3rd Generation assay with a functional sensitivity of <=0.01 uIU/mL. Performed at [facility], [address] [place], [place] [address]   SARS Coronavirus 2 (CEPHEID - Performed in [facility] hospital lab), Hosp Order     Status: None   Collection Time: 02/02/19  4:12 AM   Specimen: Nasopharyngeal Swab  Result Value Ref Range   SARS Coronavirus 2 NEGATIVE NEGATIVE    Comment: (NOTE) If result is NEGATIVE SARS-CoV-2 target nucleic acids are NOT DETECTED. The SARS-CoV-2 RNA is generally detectable in upper and lower  respiratory specimens during the acute phase of infection. The lowest  concentration of SARS-CoV-2 viral copies this assay can detect is 250  copies / mL. A negative result does not preclude SARS-CoV-2 infection  and should not be used as the sole basis for treatment or other  patient management decisions.  A negative result may occur with  improper  specimen collection / handling, submission of specimen other  than nasopharyngeal swab, presence of viral mutation(s) within the  areas targeted by this assay, and inadequate number of viral copies  (<250 copies / mL). A negative result must be combined with clinical  observations, patient history, and epidemiological information. If result is POSITIVE  SARS-CoV-2 target nucleic acids are DETECTED. The SARS-CoV-2 RNA is generally detectable in upper and lower  respiratory specimens dur ing the acute phase of infection.  Positive  results are indicative of active infection with SARS-CoV-2.  Clinical  correlation with patient history and other diagnostic information is  necessary to determine patient infection status.  Positive results do  not rule out bacterial infection or co-infection with other viruses. If result is PRESUMPTIVE POSTIVE SARS-CoV-2 nucleic acids MAY BE PRESENT.   A presumptive positive result was obtained on the submitted specimen  and confirmed on repeat testing.  While 2019 novel coronavirus  (SARS-CoV-2) nucleic acids may be present in the submitted sample  additional confirmatory testing may be necessary for epidemiological  and / or clinical management purposes  to differentiate between  SARS-CoV-2 and other Sarbecovirus currently known to infect humans.  If clinically indicated additional testing with an alternate test  methodology [phone]) is advised. The SARS-CoV-2 RNA is generally  detectable in upper and lower respiratory sp ecimens during the acute  phase of infection. The expected result is Negative. Fact Sheet for Patients:  StrictlyIdeas.no Fact Sheet for Healthcare Providers: BankingDealers.co.za This test is not yet approved or cleared by the [place] FDA and has been authorized for detection and/or diagnosis of SARS-CoV-2 by FDA under an Emergency Use Authorization (EUA).  This EUA will remain in  effect (meaning this test can be used) for the duration of the COVID-19 declaration under Section 564(b)(1) of the Act, 21 U.S.C. section 360bbb-3(b)(1), unless the authorization is terminated or revoked sooner. Performed at [facility], [address] [place], [place] [address]    No results found.  Review of Systems  Constitutional: Negative for chills and fever.  HENT: Negative for sore throat and tinnitus.   Eyes: Negative for blurred vision and redness.  Respiratory: Negative for cough and shortness of breath.   Cardiovascular: Negative for chest pain, palpitations, orthopnea and PND.  Gastrointestinal: Positive for abdominal pain, nausea and vomiting. Negative for diarrhea.  Genitourinary: Negative for dysuria, frequency and urgency.  Musculoskeletal: Negative for joint pain and myalgias.  Skin: Negative for rash.       No lesions  Neurological: Negative for speech change, focal weakness and weakness.  Endo/Heme/Allergies: Does not bruise/bleed easily.       No temperature intolerance  Psychiatric/Behavioral: Negative for depression and suicidal ideas.    Blood pressure (!) 141/86, pulse 84, temperature 97.9 F (36.6 C), temperature source Oral, resp. rate (!) 24, height 5\' 4"  (1.626 m), weight 60 kg, last menstrual period 01/03/2019, SpO2 100 %, not currently breastfeeding. Physical Exam  Vitals reviewed. Constitutional: She is oriented to person, place, and time. She appears well-developed and well-nourished. No distress.  HENT:  Head: Normocephalic and atraumatic.  Mouth/Throat: Oropharynx is clear and moist.  Eyes: Pupils are equal, round, and reactive to light. Conjunctivae and EOM are normal. No scleral icterus.  Neck: Normal range of motion. Neck supple. No JVD present. No tracheal deviation present. No thyromegaly present.  Cardiovascular: Normal rate, regular rhythm and normal heart sounds. Exam reveals no gallop and no friction rub.  No murmur  heard. Respiratory: Effort normal and breath sounds normal.  GI: Soft. Bowel sounds are normal. She exhibits no distension and no mass. There is abdominal tenderness (Diffuse). There is no rebound and no guarding.  Genitourinary:    Genitourinary Comments: Deferred   Musculoskeletal: Normal range of motion.        General: No edema.  Lymphadenopathy:    She has no cervical adenopathy.  Neurological: She is alert and oriented to person, place, and time. No cranial nerve deficit. She exhibits normal muscle tone.  Skin: Skin is warm and dry. No rash noted. No erythema.  Psychiatric: She has a normal mood and affect. Her behavior is normal. Judgment and thought content normal.     Assessment/Plan This is a 23 year old female admitted for nausea and vomiting. 1.  Intractable nausea and vomiting: With abdominal pain; no peritoneal signs.  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  No torsion demonstrated on recent CT scan.  Antiemetics as needed for symptomatic relief.  Hydrate with intravenous fluid.  If pain persists may need reimaging. 2.  Leukocytosis: Associated with stress response.  Does not meet criteria for sepsis.  Continue to monitor. 3.  Hypokalemia: Secondary to recurrent emesis.  Replete potassium 4.  Dermoid cyst: The patient will need GYN follow-up as the cysts have malignant potential 5.  DVT prophylaxis: Lovenox 6.  GI prophylaxis: None The patient is a full code.  Time spent on admission orders and patient care approximately 45 minutes  Harrie Foreman, MD 02/02/2019, 6:21 AM

## 2019-02-02 NOTE — ED Provider Notes (Signed)
[facility] Emergency Department Provider Note   ____________________________________________   First MD Initiated Contact with Patient 02/02/19 [phone]     (approximate)  I have reviewed the triage vital signs and the nursing notes.   HISTORY  Chief Complaint Abdominal Pain    HPI Tiffany Guzman is a 23 y.o. female who returns to the ED from home for persistent generalized abdominal pain, nausea/vomiting/diarrhea.  This is patient's third visit since July 9.  She has had a history of left oophorectomy for dermoid cyst.  Presented to the ED on 7/9 for abdominal pain and vomiting.  She had both an ultrasound and CT scan at that visit which demonstrated small right dermoid cyst without torsion.  She was discharged home only to return on 7/12 for persistent symptoms.  She ultimately felt better after IV Compazine and Benadryl and was discharged home on antiemetic.  She returns tonight with generalized abdominal pain, nausea, vomiting and diarrhea. Denies fever, cough, chest pain, shortness of breath, or dysuria. Denies recent travel, trauma or exposure to persons diagnosed with Coronavirus.       Past Medical History:  Diagnosis Date  . Anxiety   . Attention deficit hyperactivity disorder (ADHD)   . Chronic upper back pain   . Depression   . GERD (gastroesophageal reflux disease)   . Migraine with aura    since elementary school    Patient Active Problem List   Diagnosis Date Noted  . Intractable nausea and vomiting 02/02/2019  . Severe preeclampsia, third trimester 11/15/2017  . Labor and delivery, indication for care 11/14/2017  . Gestational hypertension 11/14/2017  . Status post cesarean delivery 11/14/2017  . Dermoid cyst 10/03/2017  . Cyst, ovary, dermoid, left 10/03/2017  . Pregnancy 10/02/2017  . 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 Dermoid cyst of ovary affecting pregnancy, antepartum 09/04/2017  . Abdominal pain during pregnancy,  third trimester 09/04/2017  . Left ovarian cyst 05/20/2017  . Supervision of normal first pregnancy, antepartum 04/14/2017  . Breast tenderness 06/24/2016  . Urinary incontinence 06/24/2016  . Anxiety 06/24/2016    Past Surgical History:  Procedure Laterality Date  . CESAREAN SECTION N/A 11/14/2017   Procedure: CESAREAN SECTION;  Surgeon: Will Bonnet, MD;  Location: [facility] ORS;  Service: Obstetrics;  Laterality: N/A;  . INSERTION OF NON VAGINAL CONTRACEPTIVE DEVICE    . OOPHORECTOMY Left 10/03/2017   Procedure: OOPHORECTOMY;  Surgeon: Homero Fellers, MD;  Location: [facility] ORS;  Service: Gynecology;  Laterality: Left;  . OVARIAN CYST REMOVAL Left 10/03/2017   Procedure: OVARIAN CYSTECTOMY;  Surgeon: Homero Fellers, MD;  Location: [facility] ORS;  Service: Gynecology;  Laterality: Left;    Prior to Admission medications   Medication Sig Start Date End Date Taking? Authorizing Provider  ketorolac (TORADOL) 10 MG tablet Take 1 tablet (10 mg total) by mouth every 6 (six) hours as needed. 01/29/19   Gregor Hams, MD  NIFEdipine (PROCARDIA-XL/ADALAT-CC/NIFEDICAL-XL) 30 MG 24 hr tablet Take 3 tablets (90 mg total) by mouth daily. Patient not taking: Reported on 12/05/2017 11/18/17 02/16/18  Rod Can, CNM  norethindrone (MICRONOR,CAMILA,ERRIN) 0.35 MG tablet Take 1 tablet (0.35 mg total) by mouth daily. 01/19/18   Will Bonnet, MD  ondansetron (ZOFRAN ODT) 4 MG disintegrating tablet Take 1 tablet (4 mg total) by mouth every 8 (eight) hours as needed. 01/29/19   Gregor Hams, MD  Prenatal Vit-Fe Fumarate-FA (PRENATAL VITAMIN PLUS LOW IRON) 27-1 MG TABS Take 1 tablet  by mouth daily. 08/05/17   Will Bonnet, MD  promethazine (PHENERGAN) 12.5 MG tablet Take 1 tablet (12.5 mg total) by mouth every 6 (six) hours as needed for nausea or vomiting. 01/31/19   Sherrie George B, FNP    Allergies Patient has no known allergies.  Family History  Problem Relation Age of Onset   . Hypertension Mother   . Hypothyroidism Mother   . Depression Mother   . Diabetes Maternal Grandmother     Social History Social History   Tobacco Use  . Smoking status: Never Smoker  . Smokeless tobacco: Never Used  Substance Use Topics  . Alcohol use: No    Alcohol/week: 0.0 standard drinks  . Drug use: Yes    Frequency: 7.0 times per week    Types: Marijuana    Review of Systems  Constitutional: No fever/chills Eyes: No visual changes. ENT: No sore throat. Cardiovascular: Denies chest pain. Respiratory: Denies shortness of breath. Gastrointestinal: Positive for abdominal pain, nausea, vomiting and diarrhea.  No constipation. Genitourinary: Negative for dysuria. Musculoskeletal: Negative for back pain. Skin: Negative for rash. Neurological: Negative for headaches, focal weakness or numbness.   ____________________________________________   PHYSICAL EXAM:  VITAL SIGNS: ED Triage Vitals [02/02/19 0319]  Enc Vitals Group     BP (!) 160/93     Pulse Rate 94     Resp 18     Temp 98.6 F (37 C)     Temp Source Oral     SpO2 100 %     Weight 130 lb (59 kg)     Height 5\' 4"  (1.626 m)     Head Circumference      Peak Flow      Pain Score 10     Pain Loc      Pain Edu?      Excl. in [place]?     Constitutional: Alert and oriented. Well appearing and in mild to moderate acute distress. Eyes: Conjunctivae are normal. PERRL. EOMI. Head: Atraumatic. Nose: No congestion/rhinnorhea. Mouth/Throat: Mucous membranes are moist.  Oropharynx non-erythematous. Neck: No stridor.   Cardiovascular: Normal rate, regular rhythm. Grossly normal heart sounds.  Good peripheral circulation. Respiratory: Normal respiratory effort.  No retractions. Lungs CTAB. Gastrointestinal: Soft and mildly diffusely tender to palpation without rebound or guarding. No distention. No abdominal bruits. No CVA tenderness. Musculoskeletal: No lower extremity tenderness nor edema.  No joint effusions.  Neurologic:  Normal speech and language. No gross focal neurologic deficits are appreciated. No gait instability. Skin:  Skin is warm, dry and intact. No rash noted. Psychiatric: Mood and affect are normal. Speech and behavior are normal.  ____________________________________________   LABS (all labs ordered are listed, but only abnormal results are displayed)  Labs Reviewed  CBC WITH DIFFERENTIAL/PLATELET - Abnormal; Notable for the following components:      Result Value   WBC 15.2 (*)    Neutro Abs 11.1 (*)    All other components within normal limits  COMPREHENSIVE METABOLIC PANEL - Abnormal; Notable for the following components:   Potassium 2.8 (*)    Glucose, Bld 105 (*)    All other components within normal limits  SARS CORONAVIRUS 2 (HOSPITAL ORDER, [facility])  C DIFFICILE QUICK SCREEN W PCR REFLEX  GASTROINTESTINAL PANEL BY PCR, STOOL (REPLACES STOOL CULTURE)  LIPASE, BLOOD  URINALYSIS, COMPLETE (UACMP) WITH MICROSCOPIC   ____________________________________________  EKG  None ____________________________________________  RADIOLOGY  ED MD interpretation: None  Official radiology report(s):  No results found.  ____________________________________________   PROCEDURES  Procedure(s) performed (including Critical Care):  Procedures   ____________________________________________   INITIAL IMPRESSION / ASSESSMENT AND PLAN / ED COURSE  As part of my medical decision making, I reviewed the following data within the [facility] notes reviewed and incorporated, Labs reviewed, Old chart reviewed, Discussed with admitting physician Dr. Marcille Blanco and Notes from prior ED visits     Tiffany Guzman was evaluated in Emergency Department on 02/02/2019 for the symptoms described in the history of present illness. She was evaluated in the context of the global COVID-19 pandemic, which necessitated consideration that  the patient might be at risk for infection with the SARS-CoV-2 virus that causes COVID-19. Institutional protocols and algorithms that pertain to the evaluation of patients at risk for COVID-19 are in a state of rapid change based on information released by regulatory bodies including the CDC and federal and state organizations. These policies and algorithms were followed during the patient's care in the ED.   22 year old female who returns to the ED for her third visit this week for persistent generalized abdominal pain, nausea, vomiting and diarrhea. Differential diagnosis includes, but is not limited to, ovarian cyst, ovarian torsion, acute appendicitis, diverticulitis, urinary tract infection/pyelonephritis, endometriosis, bowel obstruction, colitis, renal colic, gastroenteritis, hernia, fibroids, endometriosis, pregnancy related pain including ectopic pregnancy, etc.  I have personally reviewed patient's old records including her ultrasound and CT scan on 7/10.  Patient does not localize her pain to her right lower quadrant; instead it is generalized and she is experiencing intractable nausea and vomiting. Will initiate IV fluid resuscitation, IV antiemetic.  Discussed with hospitalist Dr. Marcille Blanco to evaluate patient in the emergency department for admission.  Clinical Course as of Feb 02 507  Tue Feb 02, 2019  0454 Continues to vomit and dry heaves after IV Zofran and Phenergan.  Will administer IV Compazine.  With Benadryl.  Will administer IV potassium.   [JS]  [id number] Noted patient has tested positive for cannabinoids in the past.  Query cannabinoid hyperemesis syndrome.  Will check UDS.   [JS]    Clinical Course User Index [JS] Paulette Blanch, MD     ____________________________________________   FINAL CLINICAL IMPRESSION(S) / ED DIAGNOSES  Final diagnoses:  Generalized abdominal pain  Intractable nausea and vomiting  Diarrhea, unspecified type  Hypokalemia     ED Discharge  Orders    None       Note:  This document was prepared using Dragon voice recognition software and may include unintentional dictation errors.   Paulette Blanch, MD 02/02/19 [phone]

## 2019-02-02 NOTE — Consult Note (Signed)
Obstetrics & Gynecology Consult H&P   Consulting Department: Surgery  Consulting Physician: Saundra Shelling, MD  Consulting Question: Ovarian cyst   History of Present Illness: Patient is a 23 y.o. G1P1001 with a gynecologic history significant for prior left ovarian cystectomy for 12cm dermoid cyst at [redacted] weeks gestation on 10/03/2017 who is currently admitted to general surgery for a 4 days history of abdominal pain, nausea, vomiting, and diarrhea.  Abdominal pain is mostly upper abdomen.  It does occasional radiate to her back.  No fevers or chills.  CT abdomen and pelvis 7/10/20202 revealed a 3.8cm right ovarian cyst, based on appearance favored to be a dermoid cyst.    Appendix imaged normally on CT.  Ultrasound obtained 02/02/2019 reveals the right ovarian cyst  measuring 2.5 x 2.2 x 2.2cm once again consistent in appearance with a dermoid cyst.  There is preserved doppler flow to the ovary with no evidence of torsion.    Review of Systems:10 point review of systems  Past Medical History:  Past Medical History:  Diagnosis Date   Anxiety    Attention deficit hyperactivity disorder (ADHD)    Chronic upper back pain    Depression    GERD (gastroesophageal reflux disease)    Migraine with aura    since elementary school    Past Surgical History:  Past Surgical History:  Procedure Laterality Date   CESAREAN SECTION N/A 11/14/2017   Procedure: CESAREAN SECTION;  Surgeon: Will Bonnet, MD;  Location: [facility] ORS;  Service: Obstetrics;  Laterality: N/A;   INSERTION OF NON VAGINAL CONTRACEPTIVE DEVICE     OOPHORECTOMY Left 10/03/2017   Procedure: OOPHORECTOMY;  Surgeon: Homero Fellers, MD;  Location: [facility] ORS;  Service: Gynecology;  Laterality: Left;   OVARIAN CYST REMOVAL Left 10/03/2017   Procedure: OVARIAN CYSTECTOMY;  Surgeon: Homero Fellers, MD;  Location: [facility] ORS;  Service: Gynecology;  Laterality: Left;    Gynecologic History:   Obstetric History:  G1P1001  Family History:  Family History  Problem Relation Age of Onset   Hypertension Mother    Hypothyroidism Mother    Depression Mother    Diabetes Maternal Grandmother     Social History:  Social History   Socioeconomic History   Marital status: Single    Spouse name: Not on file   Number of children: Not on file   Years of education: Not on file   Highest education level: Not on file  Occupational History   Not on file  Social Needs   Financial resource strain: Not on file   Food insecurity    Worry: Not on file    Inability: Not on file   Transportation needs    Medical: Not on file    Non-medical: Not on file  Tobacco Use   Smoking status: Never Smoker   Smokeless tobacco: Never Used  Substance and Sexual Activity   Alcohol use: No    Alcohol/week: 0.0 standard drinks   Drug use: Yes    Frequency: 7.0 times per week    Types: Marijuana   Sexual activity: Not Currently    Partners: Male    Birth control/protection: None  Lifestyle   Physical activity    Days per week: Not on file    Minutes per session: Not on file   Stress: Not on file  Relationships   Social connections    Talks on phone: Not on file    Gets together: Not on file    Attends religious  service: Not on file    Active member of club or organization: Not on file    Attends meetings of clubs or organizations: Not on file    Relationship status: Not on file   Intimate partner violence    Fear of current or ex partner: Not on file    Emotionally abused: Not on file    Physically abused: Not on file    Forced sexual activity: Not on file  Other Topics Concern   Not on file  Social History Narrative   Not on file    Allergies:  No Known Allergies  Medications: Prior to Admission medications   Medication Sig Start Date End Date Taking? Authorizing Provider  ketorolac (TORADOL) 10 MG tablet Take 1 tablet (10 mg total) by mouth every 6 (six) hours as needed.  01/29/19   Gregor Hams, MD  NIFEdipine (PROCARDIA-XL/ADALAT-CC/NIFEDICAL-XL) 30 MG 24 hr tablet Take 3 tablets (90 mg total) by mouth daily. Patient not taking: Reported on 12/05/2017 11/18/17 02/16/18  Rod Can, CNM  norethindrone (MICRONOR,CAMILA,ERRIN) 0.35 MG tablet Take 1 tablet (0.35 mg total) by mouth daily. 01/19/18   Will Bonnet, MD  ondansetron (ZOFRAN ODT) 4 MG disintegrating tablet Take 1 tablet (4 mg total) by mouth every 8 (eight) hours as needed. 01/29/19   Gregor Hams, MD  Prenatal Vit-Fe Fumarate-FA (PRENATAL VITAMIN PLUS LOW IRON) 27-1 MG TABS Take 1 tablet by mouth daily. 08/05/17   Will Bonnet, MD  promethazine (PHENERGAN) 12.5 MG tablet Take 1 tablet (12.5 mg total) by mouth every 6 (six) hours as needed for nausea or vomiting. 01/31/19   Victorino Dike, FNP    Physical Exam Vitals: Blood pressure 118/65, pulse 62, temperature 99.1 F (37.3 C), temperature source Oral, resp. rate 16, height 5\' 4"  (1.626 m), weight 60 kg, last menstrual period 01/03/2019, SpO2 97 %, not currently breastfeeding. General: NAD, well nourished, appears stated age 72: normocephalic, anicteric Pulmonary: No increased work of breathing Abdomen: Soft, reports mild diffuse tenderness, negative Murphy's sign.  No rebound, no guarding.   Extremities: no edema, erythema, or tenderness Neurologic: Grossly intact Psychiatric: mood appropriate, affect full  Labs: Results for orders placed or performed during the hospital encounter of 02/02/19 (from the past 72 hour(s))  CBC with Differential     Status: Abnormal   Collection Time: 02/02/19  3:21 AM  Result Value Ref Range   WBC 15.2 (H) 4.0 - 10.5 K/uL   RBC 4.46 3.87 - 5.11 MIL/uL   Hemoglobin 12.5 12.0 - 15.0 g/dL   HCT 37.3 36.0 - 46.0 %   MCV 83.6 80.0 - 100.0 fL   MCH 28.0 26.0 - 34.0 pg   MCHC 33.5 30.0 - 36.0 g/dL   RDW 12.9 11.5 - 15.5 %   Platelets 283 150 - 400 K/uL   nRBC 0.0 0.0 - 0.2 %   Neutrophils  Relative % 74 %   Neutro Abs 11.1 (H) 1.7 - 7.7 K/uL   Lymphocytes Relative 19 %   Lymphs Abs 2.9 0.7 - 4.0 K/uL   Monocytes Relative 7 %   Monocytes Absolute 1.0 0.1 - 1.0 K/uL   Eosinophils Relative 0 %   Eosinophils Absolute 0.1 0.0 - 0.5 K/uL   Basophils Relative 0 %   Basophils Absolute 0.1 0.0 - 0.1 K/uL   Immature Granulocytes 0 %   Abs Immature Granulocytes 0.06 0.00 - 0.07 K/uL    Comment: Performed at [facility], [address] [place], [place] [address]  Comprehensive metabolic panel     Status: Abnormal   Collection Time: 02/02/19  3:21 AM  Result Value Ref Range   Sodium 141 135 - 145 mmol/L   Potassium 2.8 (L) 3.5 - 5.1 mmol/L   Chloride 105 98 - 111 mmol/L   CO2 23 22 - 32 mmol/L   Glucose, Bld 105 (H) 70 - 99 mg/dL   BUN 15 6 - 20 mg/dL   Creatinine, Ser 0.90 0.44 - 1.00 mg/dL   Calcium 9.4 8.9 - 10.3 mg/dL   Total Protein 7.3 6.5 - 8.1 g/dL   Albumin 4.4 3.5 - 5.0 g/dL   AST 21 15 - 41 U/L   ALT 16 0 - 44 U/L   Alkaline Phosphatase 62 38 - 126 U/L   Total Bilirubin 0.8 0.3 - 1.2 mg/dL   GFR calc non Af Amer >60 >60 mL/min   GFR calc Af Amer >60 >60 mL/min   Anion gap 13 5 - 15    Comment: Performed at [facility], [address] [place], [place] [address]  Lipase, blood     Status: None   Collection Time: 02/02/19  3:21 AM  Result Value Ref Range   Lipase 27 11 - 51 U/L    Comment: Performed at [facility], [address] [place], [place] [address]  TSH     Status: None   Collection Time: 02/02/19  3:21 AM  Result Value Ref Range   TSH 2.982 0.350 - 4.500 uIU/mL    Comment: Performed by a 3rd Generation assay with a functional sensitivity of <=0.01 uIU/mL. Performed at [facility], [address], [place], [place] [address]   SARS Coronavirus 2 (CEPHEID - Performed in [facility] hospital lab), Hosp Order     Status: None   Collection Time: 02/02/19  4:12 AM   Specimen: Nasopharyngeal Swab  Result Value  Ref Range   SARS Coronavirus 2 NEGATIVE NEGATIVE    Comment: (NOTE) If result is NEGATIVE SARS-CoV-2 target nucleic acids are NOT DETECTED. The SARS-CoV-2 RNA is generally detectable in upper and lower  respiratory specimens during the acute phase of infection. The lowest  concentration of SARS-CoV-2 viral copies this assay can detect is 250  copies / mL. A negative result does not preclude SARS-CoV-2 infection  and should not be used as the sole basis for treatment or other  patient management decisions.  A negative result may occur with  improper specimen collection / handling, submission of specimen other  than nasopharyngeal swab, presence of viral mutation(s) within the  areas targeted by this assay, and inadequate number of viral copies  (<250 copies / mL). A negative result must be combined with clinical  observations, patient history, and epidemiological information. If result is POSITIVE SARS-CoV-2 target nucleic acids are DETECTED. The SARS-CoV-2 RNA is generally detectable in upper and lower  respiratory specimens dur ing the acute phase of infection.  Positive  results are indicative of active infection with SARS-CoV-2.  Clinical  correlation with patient history and other diagnostic information is  necessary to determine patient infection status.  Positive results do  not rule out bacterial infection or co-infection with other viruses. If result is PRESUMPTIVE POSTIVE SARS-CoV-2 nucleic acids MAY BE PRESENT.   A presumptive positive result was obtained on the submitted specimen  and confirmed on repeat testing.  While 2019 novel coronavirus  (SARS-CoV-2) nucleic acids may be present in the submitted sample  additional  confirmatory testing may be necessary for epidemiological  and / or clinical management purposes  to differentiate between  SARS-CoV-2 and other Sarbecovirus currently known to infect humans.  If clinically indicated additional testing with an alternate  test  methodology [phone]) is advised. The SARS-CoV-2 RNA is generally  detectable in upper and lower respiratory sp ecimens during the acute  phase of infection. The expected result is Negative. Fact Sheet for Patients:  StrictlyIdeas.no Fact Sheet for Healthcare Providers: BankingDealers.co.za This test is not yet approved or cleared by the [place] FDA and has been authorized for detection and/or diagnosis of SARS-CoV-2 by FDA under an Emergency Use Authorization (EUA).  This EUA will remain in effect (meaning this test can be used) for the duration of the COVID-19 declaration under Section 564(b)(1) of the Act, 21 U.S.C. section 360bbb-3(b)(1), unless the authorization is terminated or revoked sooner. Performed at [facility], [address] [place], [place] [address]   Urinalysis, Complete w Microscopic     Status: Abnormal   Collection Time: 02/02/19  9:40 AM  Result Value Ref Range   Color, Urine COLORLESS (A) YELLOW   APPearance CLEAR (A) CLEAR   Specific Gravity, Urine 1.006 1.005 - 1.030   pH 7.0 5.0 - 8.0   Glucose, UA >=500 (A) NEGATIVE mg/dL   Hgb urine dipstick NEGATIVE NEGATIVE   Bilirubin Urine NEGATIVE NEGATIVE   Ketones, ur 5 (A) NEGATIVE mg/dL   Protein, ur NEGATIVE NEGATIVE mg/dL   Nitrite NEGATIVE NEGATIVE   Leukocytes,Ua NEGATIVE NEGATIVE   RBC / HPF 0-5 0 - 5 RBC/hpf   WBC, UA 0-5 0 - 5 WBC/hpf   Bacteria, UA NONE SEEN NONE SEEN   Squamous Epithelial / LPF 0-5 0 - 5   Mucus PRESENT     Comment: Performed at [facility], [address], [place], [place] [address]  Urine Drug Screen, Qualitative     Status: Abnormal   Collection Time: 02/02/19  9:40 AM  Result Value Ref Range   Tricyclic, Ur Screen NONE DETECTED NONE DETECTED   Amphetamines, Ur Screen NONE DETECTED NONE DETECTED   MDMA (Ecstasy)Ur Screen NONE DETECTED NONE DETECTED   Cocaine Metabolite,Ur [place] NONE DETECTED NONE  DETECTED   Opiate, Ur Screen POSITIVE (A) NONE DETECTED   Phencyclidine (PCP) Ur S NONE DETECTED NONE DETECTED   Cannabinoid 50 Ng, Ur [place] POSITIVE (A) NONE DETECTED   Barbiturates, Ur Screen NONE DETECTED NONE DETECTED   Benzodiazepine, Ur Scrn NONE DETECTED NONE DETECTED   Methadone Scn, Ur NONE DETECTED NONE DETECTED    Comment: (NOTE) Tricyclics + metabolites, urine    Cutoff 1000 ng/mL Amphetamines + metabolites, urine  Cutoff 1000 ng/mL MDMA (Ecstasy), urine              Cutoff 500 ng/mL Cocaine Metabolite, urine          Cutoff 300 ng/mL Opiate + metabolites, urine        Cutoff 300 ng/mL Phencyclidine (PCP), urine         Cutoff 25 ng/mL Cannabinoid, urine                 Cutoff 50 ng/mL Barbiturates + metabolites, urine  Cutoff 200 ng/mL Benzodiazepine, urine              Cutoff 200 ng/mL Methadone, urine                   Cutoff 300 ng/mL The urine drug screen provides only  a preliminary, unconfirmed analytical test result and should not be used for non-medical purposes. Clinical consideration and professional judgment should be applied to any positive drug screen result due to possible interfering substances. A more specific alternate chemical method must be used in order to obtain a confirmed analytical result. Gas chromatography / mass spectrometry (GC/MS) is the preferred confirmat ory method. Performed at [facility], [address] [place],  [address]     Imaging Ct Abdomen Pelvis W Contrast  Result Date: 01/29/2019 CLINICAL DATA:  Nausea and vomiting. Acute generalized abdominal pain. EXAM: CT ABDOMEN AND PELVIS WITH CONTRAST TECHNIQUE: Multidetector CT imaging of the abdomen and pelvis was performed using the standard protocol following bolus administration of intravenous contrast. CONTRAST:  130mL OMNIPAQUE IOHEXOL 300 MG/ML  SOLN COMPARISON:  CT dated 03/25/2015 FINDINGS: Lower chest: The lung bases are clear. The heart size is normal.  Hepatobiliary: The liver is normal. Normal gallbladder.There is no biliary ductal dilation. Pancreas: Normal contours without ductal dilatation. No peripancreatic fluid collection. Spleen: No splenic laceration or hematoma. Adrenals/Urinary Tract: --Adrenal glands: No adrenal hemorrhage. --Right kidney/ureter: No hydronephrosis or perinephric hematoma. --Left kidney/ureter: No hydronephrosis or perinephric hematoma. --Urinary bladder: Unremarkable. Stomach/Bowel: --Stomach/Duodenum: There is a small hiatal hernia. --Small bowel: No dilatation or inflammation. --Colon: No focal abnormality. --Appendix: Normal. Vascular/Lymphatic: Normal course and caliber of the major abdominal vessels. --No retroperitoneal lymphadenopathy. --No mesenteric lymphadenopathy. --No pelvic or inguinal lymphadenopathy. Reproductive: There is a new fat containing right ovarian lesion measuring approximately 3.8 cm. Other: No ascites or free air. The abdominal wall is normal. Musculoskeletal. No acute displaced fractures. IMPRESSION: 1. Normal appendix in the right lower quadrant. 2. New 3.8 cm right-sided fat containing ovarian lesion favored to represent a dermoid. 3. No hydronephrosis.  No bowel obstruction. Electronically Signed   By: Constance Holster M.D.   On: 01/29/2019 03:04   US Pelvic Doppler (torsion R/o Or Mass Arterial Flow)  Result Date: 02/02/2019 CLINICAL DATA:  Continued intractable pain. EXAM: TRANSABDOMINAL AND TRANSVAGINAL ULTRASOUND OF PELVIS DOPPLER ULTRASOUND OF OVARIES TECHNIQUE: Both transabdominal and transvaginal ultrasound examinations of the pelvis were performed. Transabdominal technique was performed for global imaging of the pelvis including uterus, ovaries, adnexal regions, and pelvic cul-de-sac. It was necessary to proceed with endovaginal exam following the transabdominal exam to visualize the ovaries and endometrium. Color and duplex Doppler ultrasound was utilized to evaluate blood flow to the  ovaries. COMPARISON:  CT scan January 29, 2019. Pelvic ultrasound January 29, 2019. FINDINGS: Uterus Measurements: 9 x 3.6 x 5.6 cm = volume: 96.5 mL. No fibroids or other mass visualized. Endometrium Thickness: 11.3 mm.  No focal abnormality visualized. Right ovary Measurements: 4.6 x 2.4 x 2.6 cm = volume: 14.6 mL. 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 The fat containing portion of the dermoid measures 2.5 x 2.2 x 2.2 cm. Left ovary Surgically absent. Pulsed Doppler evaluation of the right ovary demonstrates normal low-resistance arterial and venous waveforms. Other findings There is a small to moderate amount of fluid in the cul-de-sac which is a little increased in the interval. No other abnormalities. IMPRESSION: 1. The fat containing mass in the right ovary, consistent with a dermoid, is again identified and similar in the interval. The remainder of the right ovary is normal in appearance. There is no evidence of torsion on today's study. 2. The fluid in the pelvis is slightly more prominent in the interval, possibly due to  difference in technique. This could be physiologic given patient age. Electronically Signed   By: Dorise Bullion III M.D   On: 02/02/2019 09:08   US Pelvic Complete With Transvaginal  Result Date: 02/02/2019 CLINICAL DATA:  Continued intractable pain. EXAM: TRANSABDOMINAL AND TRANSVAGINAL ULTRASOUND OF PELVIS DOPPLER ULTRASOUND OF OVARIES TECHNIQUE: Both transabdominal and transvaginal ultrasound examinations of the pelvis were performed. Transabdominal technique was performed for global imaging of the pelvis including uterus, ovaries, adnexal regions, and pelvic cul-de-sac. It was necessary to proceed with endovaginal exam following the transabdominal exam to visualize the ovaries and endometrium. Color and duplex Doppler ultrasound was utilized to evaluate blood flow to the ovaries. COMPARISON:  CT scan January 29, 2019.  Pelvic ultrasound January 29, 2019. FINDINGS: Uterus Measurements: 9 x 3.6 x 5.6 cm = volume: 96.5 mL. No fibroids or other mass visualized. Endometrium Thickness: 11.3 mm.  No focal abnormality visualized. Right ovary Measurements: 4.6 x 2.4 x 2.6 cm = volume: 14.6 mL. 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 The fat containing portion of the dermoid measures 2.5 x 2.2 x 2.2 cm. Left ovary Surgically absent. Pulsed Doppler evaluation of the right ovary demonstrates normal low-resistance arterial and venous waveforms. Other findings There is a small to moderate amount of fluid in the cul-de-sac which is a little increased in the interval. No other abnormalities. IMPRESSION: 1. The fat containing mass in the right ovary, consistent with a dermoid, is again identified and similar in the interval. The remainder of the right ovary is normal in appearance. There is no evidence of torsion on today's study. 2. The fluid in the pelvis is slightly more prominent in the interval, possibly due to difference in technique. This could be physiologic given patient age. Electronically Signed   By: Dorise Bullion III M.D   On: 02/02/2019 09:08   US Pelvic Complete W Transvaginal And Torsion R/o  Result Date: 01/29/2019 CLINICAL DATA:  Pelvic pain. Left-sided abdominal pain and vomiting. History left oophorectomy due to dermoid cyst. EXAM: TRANSABDOMINAL AND TRANSVAGINAL ULTRASOUND OF PELVIS DOPPLER ULTRASOUND OF OVARIES TECHNIQUE: Both transabdominal and transvaginal ultrasound examinations of the pelvis were performed. Transabdominal technique was performed for global imaging of the pelvis including uterus, ovaries, adnexal regions, and pelvic cul-de-sac. It was necessary to proceed with endovaginal exam following the transabdominal exam to visualize the right ovary. Color and duplex Doppler ultrasound was utilized to evaluate blood flow to the ovaries.  COMPARISON:  CT of the abdomen and pelvis 01/29/2019. MRI of the pelvis 09/05/2017 and CT of the abdomen and pelvis 03/25/2015 FINDINGS: Uterus Measurements: 8.9 x 3.6 x 5.3 cm = volume: 8.8 mL. No fibroids or other mass visualized. Endometrium Thickness: 10 mm.  No focal abnormality visualized. Right ovary Measurements: 4.9 x 2.9 x 3.2 cm = volume: 23.3 mL. Complex cystic lesion is again noted. There is a focal hyperechoic component compatible with fat. The lesion measures 2.7 x 2.4 x 2.6 cm. This is consistent with the dermoid cyst identified by CT. Left ovary Surgically absent Pulsed Doppler evaluation of both ovaries demonstrates normal low-resistance arterial and venous waveforms. Other findings A small amount of free fluid is present. IMPRESSION: 1. Dermoid cyst of the right ovary. 2. Status post left oophorectomy. 3. Normal sonographic appearance of the uterus. 4. Normal arterial and venous flow to the right ovary. Electronically Signed   By: San Morelle M.D.   On: 01/29/2019 05:54  Assessment: 23 y.o. G1P1001 with right dermoid cyst  Plan:  1) Dermoid cyst - based on size, low risk of torsion, and no evidence of torsion demonstrated on imaging.  Dermoid cyst are almost exclusively benign, with extremely low malignant potential.  While I do not feel the cyst is the cause of the patient's symptoms, I discussed given her age and prior left oophorectomy, it may be reasonable to consider ovarian cystectomy on an outpatient basis once she has recovered in order to prevent torsion down the road.  These cyst will increase slowly over time increasing the risk of torsion.   - GC/CT cultures negative in April 2020 but re-ordered this admission to rule out PID given elevated WBC - Suspect GI etiology such as gastroenteritis given associated symptoms of nausea and diarrhea - will continue to follow    Malachy Mood, MD, [place], [facility] 02/02/2019, 4:18 PM

## 2019-02-02 NOTE — Progress Notes (Signed)
[facility] NAME: Tiffany Guzman    MR#:  [id number]  DATE OF BIRTH:  04-20-1996  SUBJECTIVE:  CHIEF COMPLAINT:   Chief Complaint  Patient presents with  . Abdominal Pain  Patient seen and evaluated today Has lower abdominal discomfort Has nausea and vomiting Has some diarrhea  REVIEW OF SYSTEMS:    ROS  CONSTITUTIONAL: No documented fever. Has fatigue, weakness. No weight gain, no weight loss.  EYES: No blurry or double vision.  ENT: No tinnitus. No postnasal drip. No redness of the oropharynx.  RESPIRATORY: No cough, no wheeze, no hemoptysis. No dyspnea.  CARDIOVASCULAR: No chest pain. No orthopnea. No palpitations. No syncope.  GASTROINTESTINAL: Has nausea,  vomiting, diarrhea. mild abdominal pain. No melena or hematochezia.  GENITOURINARY: No dysuria or hematuria.  ENDOCRINE: No polyuria or nocturia. No heat or cold intolerance.  HEMATOLOGY: No anemia. No bruising. No bleeding.  INTEGUMENTARY: No rashes. No lesions.  MUSCULOSKELETAL: No arthritis. No swelling. No gout.  NEUROLOGIC: No numbness, tingling, or ataxia. No seizure-type activity.  PSYCHIATRIC: No anxiety. No insomnia. No ADD.   DRUG ALLERGIES:  No Known Allergies  VITALS:  Blood pressure 118/65, pulse 62, temperature 99.1 F (37.3 C), temperature source Oral, resp. rate 16, height 5\' 4"  (1.626 m), weight 60 kg, last menstrual period 01/03/2019, SpO2 97 %, not currently breastfeeding.  PHYSICAL EXAMINATION:   Physical Exam  GENERAL:  23 y.o.-year-old patient lying in the bed with no acute distress.  EYES: Pupils equal, round, reactive to light and accommodation. No scleral icterus. Extraocular muscles intact.  HEENT: Head atraumatic, normocephalic. Oropharynx dry and nasopharynx clear.  NECK:  Supple, no jugular venous distention. No thyroid enlargement, no tenderness.  LUNGS: Normal breath sounds bilaterally, no wheezing, rales, rhonchi. No use of  accessory muscles of respiration.  CARDIOVASCULAR: S1, S2 normal. No murmurs, rubs, or gallops.  ABDOMEN: Soft, nontender, nondistended. Bowel sounds present. No organomegaly or mass.  EXTREMITIES: No cyanosis, clubbing or edema b/l.    NEUROLOGIC: Cranial nerves II through XII are intact. No focal Motor or sensory deficits b/l.   PSYCHIATRIC: The patient is alert and oriented x 3.  SKIN: No obvious rash, lesion, or ulcer.   LABORATORY PANEL:   CBC Recent Labs  Lab 02/02/19 0321  WBC 15.2*  HGB 12.5  HCT 37.3  PLT 283   ------------------------------------------------------------------------------------------------------------------ Chemistries  Recent Labs  Lab 02/02/19 0321  NA 141  K 2.8*  CL 105  CO2 23  GLUCOSE 105*  BUN 15  CREATININE 0.90  CALCIUM 9.4  AST 21  ALT 16  ALKPHOS 62  BILITOT 0.8   ------------------------------------------------------------------------------------------------------------------  Cardiac Enzymes No results for input(s): TROPONINI in the last 168 hours. ------------------------------------------------------------------------------------------------------------------  RADIOLOGY:  US Pelvic Doppler (torsion R/o Or Mass Arterial Flow)  Result Date: 02/02/2019 CLINICAL DATA:  Continued intractable pain. EXAM: TRANSABDOMINAL AND TRANSVAGINAL ULTRASOUND OF PELVIS DOPPLER ULTRASOUND OF OVARIES TECHNIQUE: Both transabdominal and transvaginal ultrasound examinations of the pelvis were performed. Transabdominal technique was performed for global imaging of the pelvis including uterus, ovaries, adnexal regions, and pelvic cul-de-sac. It was necessary to proceed with endovaginal exam following the transabdominal exam to visualize the ovaries and endometrium. Color and duplex Doppler ultrasound was utilized to evaluate blood flow to the ovaries. COMPARISON:  CT scan January 29, 2019. Pelvic ultrasound January 29, 2019. FINDINGS: Uterus Measurements: 9 x  3.6 x 5.6 cm = volume: 96.5 mL. No fibroids or other mass visualized. Endometrium  Thickness: 11.3 mm.  No focal abnormality visualized. Right ovary Measurements: 4.6 x 2.4 x 2.6 cm = volume: 14.6 mL. 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 The fat containing portion of the dermoid measures 2.5 x 2.2 x 2.2 cm. Left ovary Surgically absent. Pulsed Doppler evaluation of the right ovary demonstrates normal low-resistance arterial and venous waveforms. Other findings There is a small to moderate amount of fluid in the cul-de-sac which is a little increased in the interval. No other abnormalities. IMPRESSION: 1. The fat containing mass in the right ovary, consistent with a dermoid, is again identified and similar in the interval. The remainder of the right ovary is normal in appearance. There is no evidence of torsion on today's study. 2. The fluid in the pelvis is slightly more prominent in the interval, possibly due to difference in technique. This could be physiologic given patient age. Electronically Signed   By: Dorise Bullion III M.D   On: 02/02/2019 09:08   US Pelvic Complete With Transvaginal  Result Date: 02/02/2019 CLINICAL DATA:  Continued intractable pain. EXAM: TRANSABDOMINAL AND TRANSVAGINAL ULTRASOUND OF PELVIS DOPPLER ULTRASOUND OF OVARIES TECHNIQUE: Both transabdominal and transvaginal ultrasound examinations of the pelvis were performed. Transabdominal technique was performed for global imaging of the pelvis including uterus, ovaries, adnexal regions, and pelvic cul-de-sac. It was necessary to proceed with endovaginal exam following the transabdominal exam to visualize the ovaries and endometrium. Color and duplex Doppler ultrasound was utilized to evaluate blood flow to the ovaries. COMPARISON:  CT scan January 29, 2019. Pelvic ultrasound January 29, 2019. FINDINGS: Uterus Measurements: 9 x 3.6 x 5.6 cm = volume: 96.5 mL. No fibroids or  other mass visualized. Endometrium Thickness: 11.3 mm.  No focal abnormality visualized. Right ovary Measurements: 4.6 x 2.4 x 2.6 cm = volume: 14.6 mL. 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 The fat containing portion of the dermoid measures 2.5 x 2.2 x 2.2 cm. Left ovary Surgically absent. Pulsed Doppler evaluation of the right ovary demonstrates normal low-resistance arterial and venous waveforms. Other findings There is a small to moderate amount of fluid in the cul-de-sac which is a little increased in the interval. No other abnormalities. IMPRESSION: 1. The fat containing mass in the right ovary, consistent with a dermoid, is again identified and similar in the interval. The remainder of the right ovary is normal in appearance. There is no evidence of torsion on today's study. 2. The fluid in the pelvis is slightly more prominent in the interval, possibly due to difference in technique. This could be physiologic given patient age. Electronically Signed   By: Dorise Bullion III M.D   On: 02/02/2019 09:08     ASSESSMENT AND PLAN:  23 year old female patient with history of attention deficit hyperactivity disorder, anxiety disorder, GERD currently under hospitalist service  -Acute gastroenteritis For C. difficile toxin negative Discontinue enteric precautions IV fluids  -Acute hypokalemia Replace potassium intravenously aggressively Secondary to diarrhea  -Ovarian mass Appears dermoid cyst GYN consult Reviewed pelvic ultrasound  -Dehydration IV fluids  -DVT prophylaxis subcu Lovenox daily  All the records are reviewed and case discussed with Care Management/Social Worker. Management plans discussed with the patient, family and they are in agreement.  CODE STATUS: Full code  DVT Prophylaxis: SCDs  TOTAL TIME TAKING CARE OF THIS PATIENT: 36 minutes.   POSSIBLE D/C IN 1 to 2 DAYS, DEPENDING  ON CLINICAL  CONDITION.  Saundra Shelling M.D on 02/02/2019 at 3:21 PM  Between 7am to 6pm - Pager - [phone]  After 6pm go to [email or web address] - password [email or web address] [facility]  Office  [phone]  CC: Primary care physician; Arnetha Courser, MD  Note: This dictation was prepared with Dragon dictation along with smaller phrase technology. Any transcriptional errors that result from this process are unintentional.

## 2019-02-02 NOTE — Progress Notes (Signed)
Advanced care plan. Purpose of the Encounter: CODE STATUS Parties in Attendance: Patient Patient's Decision Capacity: Good Subjective/Patient's story: Presented to emergency room for nausea vomiting and abdominal pain The patient with past medical history of migraines, anxiety and depression presents to the emergency department complaining of abdominal pain, nausea and vomiting.  The patient has had all 3 symptoms in varying degrees for at least 3 days.  She denies blood or bile in her emesis.  Patient denies diarrhea and fever.  After multiple doses of antiemetics and pain medications in the emergency department the patient continued to have pain and inability to hold down water and food which prompted the emergency department staff to call the hospitalist service for admission. Objective/Medical story Patient needs IV fluids, electrolyte supplementation Needs C. difficile toxin testing Needs stool work-up Goals of care determination:  Goals of care treatment plan discussed with patient in detail CODE STATUS: Full resuscitation Time spent discussing advanced care planning: 16 minutes

## 2019-02-03 DIAGNOSIS — R112 Nausea with vomiting, unspecified: Secondary | ICD-10-CM | POA: Diagnosis not present

## 2019-02-03 DIAGNOSIS — D72829 Elevated white blood cell count, unspecified: Secondary | ICD-10-CM | POA: Diagnosis not present

## 2019-02-03 DIAGNOSIS — E876 Hypokalemia: Secondary | ICD-10-CM | POA: Diagnosis not present

## 2019-02-03 DIAGNOSIS — D369 Benign neoplasm, unspecified site: Secondary | ICD-10-CM | POA: Diagnosis not present

## 2019-02-03 LAB — CBC
HCT: 33.5 % — ABNORMAL LOW (ref 36.0–46.0)
Hemoglobin: 10.8 g/dL — ABNORMAL LOW (ref 12.0–15.0)
MCH: 28 pg (ref 26.0–34.0)
MCHC: 32.2 g/dL (ref 30.0–36.0)
MCV: 86.8 fL (ref 80.0–100.0)
Platelets: 214 10*3/uL (ref 150–400)
RBC: 3.86 MIL/uL — ABNORMAL LOW (ref 3.87–5.11)
RDW: 13.2 % (ref 11.5–15.5)
WBC: 10.6 10*3/uL — ABNORMAL HIGH (ref 4.0–10.5)
nRBC: 0 % (ref 0.0–0.2)

## 2019-02-03 LAB — BASIC METABOLIC PANEL
Anion gap: 8 (ref 5–15)
BUN: 10 mg/dL (ref 6–20)
CO2: 22 mmol/L (ref 22–32)
Calcium: 8.2 mg/dL — ABNORMAL LOW (ref 8.9–10.3)
Chloride: 110 mmol/L (ref 98–111)
Creatinine, Ser: 0.69 mg/dL (ref 0.44–1.00)
GFR calc Af Amer: >60 mL/min (ref >60)
GFR calc non Af Amer: >60 mL/min (ref >60)
Glucose, Bld: 63 mg/dL — ABNORMAL LOW (ref 70–99)
Potassium: 4.2 mmol/L (ref 3.5–5.1)
Sodium: 140 mmol/L (ref 135–145)

## 2019-02-03 MED ORDER — TRAMADOL HCL 50 MG PO TABS
50.0000 mg | ORAL_TABLET | Freq: Four times a day (QID) | ORAL | Status: DC | PRN
  Administered 2019-02-03: 50 mg via ORAL
  Filled 2019-02-03: qty 1

## 2019-02-03 MED ORDER — MORPHINE SULFATE (PF) 2 MG/ML IV SOLN
INTRAVENOUS | Status: AC
  Filled 2019-02-03: qty 1

## 2019-02-03 NOTE — Plan of Care (Signed)
Patient continues to complain of nausea and abdominal pain throughout the night. PRN Morphine, phenergan, and Zofran administered over the night. Will continue to monitor.

## 2019-02-03 NOTE — Progress Notes (Signed)
Tiffany Guzman to be D/C'd home per MD order.  Discussed prescriptions and follow up appointments with the patient. Prescriptions given to patient, medication list explained in detail. Pt verbalized understanding.  Allergies as of 02/03/2019   No Known Allergies     Medication List    STOP taking these medications   NIFEdipine 30 MG 24 hr tablet Commonly known as: PROCARDIA-XL/NIFEDICAL-XL     TAKE these medications   ketorolac 10 MG tablet Commonly known as: TORADOL Take 1 tablet (10 mg total) by mouth every 6 (six) hours as needed.   norethindrone 0.35 MG tablet Commonly known as: MICRONOR Take 1 tablet (0.35 mg total) by mouth daily.   ondansetron 4 MG disintegrating tablet Commonly known as: Zofran ODT Take 1 tablet (4 mg total) by mouth every 8 (eight) hours as needed.   Prenatal Vitamin Plus Low Iron 27-1 MG Tabs Take 1 tablet by mouth daily.   promethazine 12.5 MG tablet Commonly known as: PHENERGAN Take 1 tablet (12.5 mg total) by mouth every 6 (six) hours as needed for nausea or vomiting.       Vitals:   02/02/19 2104 02/03/19 0538  BP: 134/76 (!) 145/77  Pulse: 61 75  Resp: 20 20  Temp: 99 F (37.2 C) 98.8 F (37.1 C)  SpO2: 99% 98%    Skin clean, dry and intact without evidence of skin break down, no evidence of skin tears noted. IV catheter discontinued intact. Site without signs and symptoms of complications. Dressing and pressure applied. Pt denies pain at this time. No complaints noted.  An After Visit Summary was printed and given to the patient. Patient escorted via [place], and D/C home via private auto.  Tiffany Guzman A Armaan Pond

## 2019-02-03 NOTE — Discharge Summary (Signed)
[facility] NAME: Anyela Napierkowski    MR#:  [id number]  DATE OF BIRTH:  06/21/1996  DATE OF ADMISSION:  02/02/2019 ADMITTING PHYSICIAN: Harrie Foreman, MD  DATE OF DISCHARGE: 02/03/2019  PRIMARY CARE PHYSICIAN: Arnetha Courser, MD    ADMISSION DIAGNOSIS:  Hypokalemia [E87.6] Generalized abdominal pain [R10.84] Intractable nausea and vomiting [R11.2] Diarrhea, unspecified type [R19.7]  DISCHARGE DIAGNOSIS:  Active Problems:   Intractable nausea and vomiting   Hypokalemia   SECONDARY DIAGNOSIS:   Past Medical History:  Diagnosis Date  . Anxiety   . Attention deficit hyperactivity disorder (ADHD)   . Chronic upper back pain   . Depression   . GERD (gastroesophageal reflux disease)   . Migraine with aura    since elementary school    HOSPITAL COURSE:   23 year old female with past medical history of ADHD, anxiety, GERD, depression, migraines who presented to the hospital due to nausea vomiting and diarrhea.  1.  Gastroenteritis-this was the cause of patient's nausea vomiting and diarrhea. - C. difficile was negative, patient symptoms improved just with supportive care and she was not able to give [place] a sample to check for comprehensive culture.  She has clinically improved her diet has been advanced.  She is tolerating p.o. without any further abdominal pain nausea vomiting.  2.  Acute hypokalemia-secondary to the diarrhea. -Much improved with supplementation now normalized.  3.  Ovarian mass- suspected to be a dermoid cyst based on imaging study in the hospital.  Seen by OB/GYN and plan for outpatient follow-up.  4.  Dehydration-improved and resolved with IV fluid hydration.  Stable to be discharged home today.  DISCHARGE CONDITIONS:   Stable  CONSULTS OBTAINED:  Treatment Team:  [place], MD  DRUG ALLERGIES:  No Known Allergies  DISCHARGE MEDICATIONS:   Allergies as  of 02/03/2019   No Known Allergies     Medication List    STOP taking these medications   NIFEdipine 30 MG 24 hr tablet Commonly known as: PROCARDIA-XL/NIFEDICAL-XL     TAKE these medications   ketorolac 10 MG tablet Commonly known as: TORADOL Take 1 tablet (10 mg total) by mouth every 6 (six) hours as needed.   norethindrone 0.35 MG tablet Commonly known as: MICRONOR Take 1 tablet (0.35 mg total) by mouth daily.   ondansetron 4 MG disintegrating tablet Commonly known as: Zofran ODT Take 1 tablet (4 mg total) by mouth every 8 (eight) hours as needed.   Prenatal Vitamin Plus Low Iron 27-1 MG Tabs Take 1 tablet by mouth daily.   promethazine 12.5 MG tablet Commonly known as: PHENERGAN Take 1 tablet (12.5 mg total) by mouth every 6 (six) hours as needed for nausea or vomiting.         DISCHARGE INSTRUCTIONS:   DIET:  Regular diet  DISCHARGE CONDITION:  Stable  ACTIVITY:  Activity as tolerated  OXYGEN:  Home Oxygen: No.   Oxygen Delivery: room air  DISCHARGE LOCATION:  home   If you experience worsening of your admission symptoms, develop shortness of breath, life threatening emergency, suicidal or homicidal thoughts you must seek medical attention immediately by calling 911 or calling your MD immediately  if symptoms less severe.  You Must read complete instructions/literature along with all the possible adverse reactions/side effects for all the Medicines you take and that have been prescribed to you. Take any new Medicines after you have completely understood and accpet all  the possible adverse reactions/side effects.   Please note  You were cared for by a hospitalist during your hospital stay. If you have any questions about your discharge medications or the care you received while you were in the hospital after you are discharged, you can call the unit and asked to speak with the hospitalist on call if the hospitalist that took care of you is not  available. Once you are discharged, your primary care physician will handle any further medical issues. Please note that NO REFILLS for any discharge medications will be authorized once you are discharged, as it is imperative that you return to your primary care physician (or establish a relationship with a primary care physician if you do not have one) for your aftercare needs so that they can reassess your need for medications and monitor your lab values.     Today   No acute events overnight.  No diarrhea presently.  Diet has been advanced which she is tolerating without any further diarrhea nausea or vomiting.  Will discharge home today.  VITAL SIGNS:  Blood pressure (!) 145/77, pulse 75, temperature 98.8 F (37.1 C), temperature source Oral, resp. rate 20, height 5\' 4"  (1.626 m), weight 62.9 kg, last menstrual period 01/03/2019, SpO2 98 %, not currently breastfeeding.  I/O:    Intake/Output Summary (Last 24 hours) at 02/03/2019 1610 Last data filed at 02/03/2019 0754 Gross per 24 hour  Intake 3366.52 ml  Output 900 ml  Net 2466.52 ml    PHYSICAL EXAMINATION:  GENERAL:  23 y.o.-year-old patient lying in the bed with no acute distress.  EYES: Pupils equal, round, reactive to light and accommodation. No scleral icterus. Extraocular muscles intact.  HEENT: Head atraumatic, normocephalic. Oropharynx and nasopharynx clear.  NECK:  Supple, no jugular venous distention. No thyroid enlargement, no tenderness.  LUNGS: Normal breath sounds bilaterally, no wheezing, rales,rhonchi. No use of accessory muscles of respiration.  CARDIOVASCULAR: S1, S2 normal. No murmurs, rubs, or gallops.  ABDOMEN: Soft, non-tender, non-distended. Bowel sounds present. No organomegaly or mass.  EXTREMITIES: No pedal edema, cyanosis, or clubbing.  NEUROLOGIC: Cranial nerves II through XII are intact. No focal motor or sensory defecits b/l.  PSYCHIATRIC: The patient is alert and oriented x 3.  SKIN: No obvious  rash, lesion, or ulcer.   DATA REVIEW:   CBC Recent Labs  Lab 02/03/19 0609  WBC 10.6*  HGB 10.8*  HCT 33.5*  PLT 214    Chemistries  Recent Labs  Lab 02/02/19 0321  02/03/19 0609  NA 141  --  140  K 2.8*   < > 4.2  CL 105  --  110  CO2 23  --  22  GLUCOSE 105*  --  63*  BUN 15  --  10  CREATININE 0.90  --  0.69  CALCIUM 9.4  --  8.2*  AST 21  --   --   ALT 16  --   --   ALKPHOS 62  --   --   BILITOT 0.8  --   --    < > = values in this interval not displayed.    Cardiac Enzymes No results for input(s): TROPONINI in the last 168 hours.  Microbiology Results  Results for orders placed or performed during the hospital encounter of 02/02/19  SARS Coronavirus 2 (CEPHEID - Performed in [facility] hospital lab), Hosp Order     Status: None   Collection Time: 02/02/19  4:12 AM   Specimen: Nasopharyngeal  Swab  Result Value Ref Range Status   SARS Coronavirus 2 NEGATIVE NEGATIVE Final    Comment: (NOTE) If result is NEGATIVE SARS-CoV-2 target nucleic acids are NOT DETECTED. The SARS-CoV-2 RNA is generally detectable in upper and lower  respiratory specimens during the acute phase of infection. The lowest  concentration of SARS-CoV-2 viral copies this assay can detect is 250  copies / mL. A negative result does not preclude SARS-CoV-2 infection  and should not be used as the sole basis for treatment or other  patient management decisions.  A negative result may occur with  improper specimen collection / handling, submission of specimen other  than nasopharyngeal swab, presence of viral mutation(s) within the  areas targeted by this assay, and inadequate number of viral copies  (<250 copies / mL). A negative result must be combined with clinical  observations, patient history, and epidemiological information. If result is POSITIVE SARS-CoV-2 target nucleic acids are DETECTED. The SARS-CoV-2 RNA is generally detectable in upper and lower  respiratory specimens  dur ing the acute phase of infection.  Positive  results are indicative of active infection with SARS-CoV-2.  Clinical  correlation with patient history and other diagnostic information is  necessary to determine patient infection status.  Positive results do  not rule out bacterial infection or co-infection with other viruses. If result is PRESUMPTIVE POSTIVE SARS-CoV-2 nucleic acids MAY BE PRESENT.   A presumptive positive result was obtained on the submitted specimen  and confirmed on repeat testing.  While 2019 novel coronavirus  (SARS-CoV-2) nucleic acids may be present in the submitted sample  additional confirmatory testing may be necessary for epidemiological  and / or clinical management purposes  to differentiate between  SARS-CoV-2 and other Sarbecovirus currently known to infect humans.  If clinically indicated additional testing with an alternate test  methodology [phone]) is advised. The SARS-CoV-2 RNA is generally  detectable in upper and lower respiratory sp ecimens during the acute  phase of infection. The expected result is Negative. Fact Sheet for Patients:  StrictlyIdeas.no Fact Sheet for Healthcare Providers: BankingDealers.co.za This test is not yet approved or cleared by the [place] FDA and has been authorized for detection and/or diagnosis of SARS-CoV-2 by FDA under an Emergency Use Authorization (EUA).  This EUA will remain in effect (meaning this test can be used) for the duration of the COVID-19 declaration under Section 564(b)(1) of the Act, 21 U.S.C. section 360bbb-3(b)(1), unless the authorization is terminated or revoked sooner. Performed at [facility], [address] [place], [place] [address]   [place] rt PCR [facility] only)     Status: None   Collection Time: 02/02/19  5:55 PM   Specimen: Urine  Result Value Ref Range Status   Specimen source GC/Chlam ENDOCERVICAL  Final    Chlamydia Tr NOT DETECTED NOT DETECTED Final   N gonorrhoeae NOT DETECTED NOT DETECTED Final    Comment: (NOTE) This CT/NG assay has not been evaluated in patients with a history of  hysterectomy. Performed at [facility], [address] [place], [place] [address]     RADIOLOGY:  US Pelvic Doppler (torsion R/o Or Mass Arterial Flow)  Result Date: 02/02/2019 CLINICAL DATA:  Continued intractable pain. EXAM: TRANSABDOMINAL AND TRANSVAGINAL ULTRASOUND OF PELVIS DOPPLER ULTRASOUND OF OVARIES TECHNIQUE: Both transabdominal and transvaginal ultrasound examinations of the pelvis were performed. Transabdominal technique was performed for global imaging of the pelvis including uterus, ovaries, adnexal regions, and pelvic cul-de-sac. It was necessary to proceed with  endovaginal exam following the transabdominal exam to visualize the ovaries and endometrium. Color and duplex Doppler ultrasound was utilized to evaluate blood flow to the ovaries. COMPARISON:  CT scan January 29, 2019. Pelvic ultrasound January 29, 2019. FINDINGS: Uterus Measurements: 9 x 3.6 x 5.6 cm = volume: 96.5 mL. No fibroids or other mass visualized. Endometrium Thickness: 11.3 mm.  No focal abnormality visualized. Right ovary Measurements: 4.6 x 2.4 x 2.6 cm = volume: 14.6 mL. 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 The fat containing portion of the dermoid measures 2.5 x 2.2 x 2.2 cm. Left ovary Surgically absent. Pulsed Doppler evaluation of the right ovary demonstrates normal low-resistance arterial and venous waveforms. Other findings There is a small to moderate amount of fluid in the cul-de-sac which is a little increased in the interval. No other abnormalities. IMPRESSION: 1. The fat containing mass in the right ovary, consistent with a dermoid, is again identified and similar in the interval. The remainder of the right ovary is normal in appearance. There is no  evidence of torsion on today's study. 2. The fluid in the pelvis is slightly more prominent in the interval, possibly due to difference in technique. This could be physiologic given patient age. Electronically Signed   By: Dorise Bullion III M.D   On: 02/02/2019 09:08   US Pelvic Complete With Transvaginal  Result Date: 02/02/2019 CLINICAL DATA:  Continued intractable pain. EXAM: TRANSABDOMINAL AND TRANSVAGINAL ULTRASOUND OF PELVIS DOPPLER ULTRASOUND OF OVARIES TECHNIQUE: Both transabdominal and transvaginal ultrasound examinations of the pelvis were performed. Transabdominal technique was performed for global imaging of the pelvis including uterus, ovaries, adnexal regions, and pelvic cul-de-sac. It was necessary to proceed with endovaginal exam following the transabdominal exam to visualize the ovaries and endometrium. Color and duplex Doppler ultrasound was utilized to evaluate blood flow to the ovaries. COMPARISON:  CT scan January 29, 2019. Pelvic ultrasound January 29, 2019. FINDINGS: Uterus Measurements: 9 x 3.6 x 5.6 cm = volume: 96.5 mL. No fibroids or other mass visualized. Endometrium Thickness: 11.3 mm.  No focal abnormality visualized. Right ovary Measurements: 4.6 x 2.4 x 2.6 cm = volume: 14.6 mL. 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 The fat containing portion of the dermoid measures 2.5 x 2.2 x 2.2 cm. Left ovary Surgically absent. Pulsed Doppler evaluation of the right ovary demonstrates normal low-resistance arterial and venous waveforms. Other findings There is a small to moderate amount of fluid in the cul-de-sac which is a little increased in the interval. No other abnormalities. IMPRESSION: 1. The fat containing mass in the right ovary, consistent with a dermoid, is again identified and similar in the interval. The remainder of the right ovary is normal in appearance. There is no evidence of torsion on today's study. 2. The  fluid in the pelvis is slightly more prominent in the interval, possibly due to difference in technique. This could be physiologic given patient age. Electronically Signed   By: Dorise Bullion III M.D   On: 02/02/2019 09:08      Management plans discussed with the patient, family and they are in agreement.  CODE STATUS:     Code Status Orders  (From admission, onward)         Start     Ordered   02/02/19 0527  Full code  Continuous     02/02/19 0526  TOTAL TIME TAKING CARE OF THIS PATIENT: 40 minutes.    Henreitta Leber M.D on 02/03/2019 at 4:10 PM  Between 7am to 6pm - Pager - [phone]  After 6pm go to [email or web address] - Proofreader  [facility]  Office  [phone]  CC: Primary care physician; Arnetha Courser, MD

## 2019-02-10 ENCOUNTER — Ambulatory Visit (INDEPENDENT_AMBULATORY_CARE_PROVIDER_SITE_OTHER): Payer: Medicaid Other | Admitting: Nurse Practitioner

## 2019-02-10 ENCOUNTER — Encounter: Payer: Self-pay | Admitting: Nurse Practitioner

## 2019-02-10 ENCOUNTER — Other Ambulatory Visit: Payer: Self-pay

## 2019-02-10 VITALS — BP 116/74 | HR 98 | Temp 97.3°F | Resp 14 | Ht 63.0 in | Wt 130.0 lb

## 2019-02-10 DIAGNOSIS — K529 Noninfective gastroenteritis and colitis, unspecified: Secondary | ICD-10-CM | POA: Diagnosis not present

## 2019-02-10 DIAGNOSIS — Z09 Encounter for follow-up examination after completed treatment for conditions other than malignant neoplasm: Secondary | ICD-10-CM | POA: Diagnosis not present

## 2019-02-10 NOTE — Progress Notes (Signed)
Name: Tiffany Guzman   MRN: [id number]    DOB: Nov 24, 1995   Date:02/10/2019       Progress Note  Subjective  Chief Complaint  Chief Complaint  Patient presents with  . Follow-up    ER  . GI Problem    better    HPI  Patient presents for hospital follow-up. Was admitted to [facility] from 7/12-/7/14 for gastroenteritis causing dehydration and hypokalemia. Patient was admitted and provided IVF, potassium supplementation and was advanced to PO diet upon discharge. She was having significant abdominal pain from suspected dermoid cyst with plan for outpatient follow-up to OBGYN.  Has been feeling well since then. Has still been eating bland foods typically, added some greasy food- but had some cramping but states later was able to tolerate it. Denies nausea, vomiting or abdominal cramping. Had some loose stools after greasy foods.   PHQ2/9: Depression screen [facility] 2/9 02/10/2019 06/24/2017 06/24/2016 12/15/2015 08/04/2015  Decreased Interest 0 0 0 0 0  Down, Depressed, Hopeless 0 1 0 1 0  PHQ - 2 Score 0 1 0 1 0  Altered sleeping 0 - - - -  Tired, decreased energy 0 - - - -  Change in appetite 0 - - - -  Feeling bad or failure about yourself  0 - - - -  Trouble concentrating 0 - - - -  Moving slowly or fidgety/restless 0 - - - -  Suicidal thoughts 0 - - - -  PHQ-9 Score 0 - - - -  Difficult doing work/chores Not difficult at all - - - -    PHQ reviewed. Negative  Patient Active Problem List   Diagnosis Date Noted  . Intractable nausea and vomiting 02/02/2019  . Hypokalemia 02/02/2019  . Severe preeclampsia, third trimester 11/15/2017  . Labor and delivery, indication for care 11/14/2017  . Gestational hypertension 11/14/2017  . Status post cesarean delivery 11/14/2017  . Dermoid cyst 10/03/2017  . Cyst, ovary, dermoid, left 10/03/2017  . Pregnancy 10/02/2017  . 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 Dermoid cyst of ovary affecting pregnancy, antepartum 09/04/2017  . Abdominal  pain during pregnancy, third trimester 09/04/2017  . Left ovarian cyst 05/20/2017  . Supervision of normal first pregnancy, antepartum 04/14/2017  . Breast tenderness 06/24/2016  . Urinary incontinence 06/24/2016  . Anxiety 06/24/2016    Past Medical History:  Diagnosis Date  . Anxiety   . Attention deficit hyperactivity disorder (ADHD)   . Chronic upper back pain   . Depression   . GERD (gastroesophageal reflux disease)   . Migraine with aura    since elementary school    Past Surgical History:  Procedure Laterality Date  . CESAREAN SECTION N/A 11/14/2017   Procedure: CESAREAN SECTION;  Surgeon: Will Bonnet, MD;  Location: [facility] ORS;  Service: Obstetrics;  Laterality: N/A;  . INSERTION OF NON VAGINAL CONTRACEPTIVE DEVICE    . OOPHORECTOMY Left 10/03/2017   Procedure: OOPHORECTOMY;  Surgeon: Homero Fellers, MD;  Location: [facility] ORS;  Service: Gynecology;  Laterality: Left;  . OVARIAN CYST REMOVAL Left 10/03/2017   Procedure: OVARIAN CYSTECTOMY;  Surgeon: Homero Fellers, MD;  Location: [facility] ORS;  Service: Gynecology;  Laterality: Left;    Social History   Tobacco Use  . Smoking status: Never Smoker  . Smokeless tobacco: Never Used  Substance Use Topics  . Alcohol use: No    Alcohol/week: 0.0 standard drinks     Current Outpatient Medications:  .  ketorolac (TORADOL)  10 MG tablet, Take 1 tablet (10 mg total) by mouth every 6 (six) hours as needed. (Patient not taking: Reported on 02/10/2019), Disp: 20 tablet, Rfl: 0 .  norethindrone (MICRONOR,CAMILA,ERRIN) 0.35 MG tablet, Take 1 tablet (0.35 mg total) by mouth daily. (Patient not taking: Reported on 02/10/2019), Disp: 3 Package, Rfl: 4 .  ondansetron (ZOFRAN ODT) 4 MG disintegrating tablet, Take 1 tablet (4 mg total) by mouth every 8 (eight) hours as needed. (Patient not taking: Reported on 02/10/2019), Disp: 20 tablet, Rfl: 0 .  Prenatal Vit-Fe Fumarate-FA (PRENATAL VITAMIN PLUS LOW IRON) 27-1 MG TABS, Take 1  tablet by mouth daily. (Patient not taking: Reported on 02/03/2019), Disp: 30 tablet, Rfl: 5 .  promethazine (PHENERGAN) 12.5 MG tablet, Take 1 tablet (12.5 mg total) by mouth every 6 (six) hours as needed for nausea or vomiting. (Patient not taking: Reported on 02/10/2019), Disp: 30 tablet, Rfl: 0  No Known Allergies  ROS    No other specific complaints in a complete review of systems (except as listed in HPI above).  Objective  Vitals:   02/10/19 1457  BP: 116/74  Pulse: 98  Resp: 14  Temp: (!) 97.3 F (36.3 C)  SpO2: 95%  Weight: 130 lb (59 kg)  Height: 5\' 3"  (1.6 m)     Body mass index is 23.03 kg/m.  Nursing Note and Vital Signs reviewed.  Physical Exam Constitutional:      Appearance: Normal appearance. She is well-developed.  HENT:     Head: Normocephalic and atraumatic.     Right Ear: Hearing normal.     Left Ear: Hearing normal.  Eyes:     Conjunctiva/sclera: Conjunctivae normal.  Cardiovascular:     Rate and Rhythm: Normal rate and regular rhythm.     Heart sounds: Normal heart sounds.  Pulmonary:     Effort: Pulmonary effort is normal.     Breath sounds: Normal breath sounds.  Abdominal:     General: Abdomen is flat. Bowel sounds are normal. There is no distension.     Tenderness: There is no abdominal tenderness. There is no guarding.  Musculoskeletal: Normal range of motion.  Neurological:     Mental Status: She is alert and oriented to person, place, and time.  Psychiatric:        Speech: Speech normal.        Behavior: Behavior normal. Behavior is cooperative.        Thought Content: Thought content normal.        Judgment: Judgment normal.        No results found for this or any previous visit (from the past 48 hour(s)).  Assessment & Plan  1. Hospital discharge follow-up   2. Gastroenteritis Resolved, discussed low fodmap diet. Reviewed labs and imaging results with patient.

## 2019-02-10 NOTE — Patient Instructions (Signed)
Low-FODMAP Eating Plan  FODMAPs (fermentable oligosaccharides, disaccharides, monosaccharides, and polyols) are sugars that are hard for some people to digest. A low-FODMAP eating plan may help some people who have bowel (intestinal) diseases to manage their symptoms. This meal plan can be complicated to follow. Work with a diet and nutrition specialist (dietitian) to make a low-FODMAP eating plan that is right for you. A dietitian can make sure that you get enough nutrition from this diet. What are tips for following this plan? Reading food labels  Check labels for hidden FODMAPs such as: ? High-fructose syrup. ? Honey. ? Agave. ? Natural fruit flavors. ? Onion or garlic powder.  Choose low-FODMAP foods that contain 3-4 grams of fiber per serving.  Check food labels for serving sizes. Eat only one serving at a time to make sure FODMAP levels stay low. Meal planning  Follow a low-FODMAP eating plan for up to 6 weeks, or as told by your health care provider or dietitian.  To follow the eating plan: 1. Eliminate high-FODMAP foods from your diet completely. 2. Gradually reintroduce high-FODMAP foods into your diet one at a time. Most people should wait a few days after introducing one high-FODMAP food before they introduce the next high-FODMAP food. Your dietitian can recommend how quickly you may reintroduce foods. 3. Keep a daily record of what you eat and drink, and make note of any symptoms that you have after eating. 4. Review your daily record with a dietitian regularly. Your dietitian can help you identify which foods you can eat and which foods you should avoid. General tips  Drink enough fluid each day to keep your urine pale yellow.  Avoid processed foods. These often have added sugar and may be high in FODMAPs.  Avoid most dairy products, whole grains, and sweeteners.  Work with a dietitian to make sure you get enough fiber in your diet. Recommended foods Grains   Gluten-free grains, such as rice, oats, buckwheat, quinoa, corn, polenta, and millet. Gluten-free pasta, bread, or cereal. Rice noodles. Corn tortillas. Vegetables  Eggplant, zucchini, cucumber, peppers, green beans, Brussels sprouts, bean sprouts, lettuce, arugula, kale, Swiss chard, spinach, collard greens, bok choy, summer squash, potato, and tomato. Limited amounts of corn, carrot, and sweet potato. Green parts of scallions. Fruits  Bananas, oranges, lemons, limes, blueberries, raspberries, strawberries, grapes, cantaloupe, honeydew melon, kiwi, papaya, passion fruit, and pineapple. Limited amounts of dried cranberries, banana chips, and shredded coconut. Dairy  Lactose-free milk, yogurt, and kefir. Lactose-free cottage cheese and ice cream. Non-dairy milks, such as almond, coconut, hemp, and rice milk. Yogurts made of non-dairy milks. Limited amounts of goat cheese, brie, mozzarella, parmesan, swiss, and other hard cheeses. Meats and other protein foods  Unseasoned beef, pork, poultry, or fish. Eggs. Bacon. Tofu (firm) and tempeh. Limited amounts of nuts and seeds, such as almonds, walnuts, brazil nuts, pecans, peanuts, pumpkin seeds, chia seeds, and sunflower seeds. Fats and oils  Butter-free spreads. Vegetable oils, such as olive, canola, and sunflower oil. Seasoning and other foods  Artificial sweeteners with names that do not end in "ol" such as aspartame, saccharine, and stevia. Maple syrup, white table sugar, raw sugar, brown sugar, and molasses. Fresh basil, coriander, parsley, rosemary, and thyme. Beverages  Water and mineral water. Sugar-sweetened soft drinks. Small amounts of orange juice or cranberry juice. Black and green tea. Most dry wines. Coffee. This may not be a complete list of low-FODMAP foods. Talk with your dietitian for more information. Foods to avoid Grains  Wheat,   including kamut, durum, and semolina. Barley and bulgur. Couscous. Wheat-based cereals. Wheat  noodles, bread, crackers, and pastries. Vegetables  Chicory root, artichoke, asparagus, cabbage, snow peas, sugar snap peas, mushrooms, and cauliflower. Onions, garlic, leeks, and the white part of scallions. Fruits  Fresh, dried, and juiced forms of apple, pear, watermelon, peach, plum, cherries, apricots, blackberries, boysenberries, figs, nectarines, and mango. Avocado. Dairy  Milk, yogurt, ice cream, and soft cheese. Cream and sour cream. Milk-based sauces. Custard. Meats and other protein foods  Fried or fatty meat. Sausage. Cashews and pistachios. Soybeans, baked beans, black beans, chickpeas, kidney beans, fava beans, navy beans, lentils, and split peas. Seasoning and other foods  Any sugar-free gum or candy. Foods that contain artificial sweeteners such as sorbitol, mannitol, isomalt, or xylitol. Foods that contain honey, high-fructose corn syrup, or agave. Bouillon, vegetable stock, beef stock, and chicken stock. Garlic and onion powder. Condiments made with onion, such as hummus, chutney, pickles, relish, salad dressing, and salsa. Tomato paste. Beverages  Chicory-based drinks. Coffee substitutes. Chamomile tea. Fennel tea. Sweet or fortified wines such as port or sherry. Diet soft drinks made with isomalt, mannitol, maltitol, sorbitol, or xylitol. Apple, pear, and mango juice. Juices with high-fructose corn syrup. This may not be a complete list of high-FODMAP foods. Talk with your dietitian to discuss what dietary choices are best for you.  Summary  A low-FODMAP eating plan is a short-term diet that eliminates FODMAPs from your diet to help ease symptoms of certain bowel diseases.  The eating plan usually lasts up to 6 weeks. After that, high-FODMAP foods are restarted gradually, one at a time, so you can find out which may be causing symptoms.  A low-FODMAP eating plan can be complicated. It is best to work with a dietitian who has experience with this type of plan. This  information is not intended to replace advice given to you by your health care provider. Make sure you discuss any questions you have with your health care provider. Document Released: 03/04/2017 Document Revised: 06/20/2017 Document Reviewed: 03/04/2017 Elsevier Patient Education  2020 [employer]  

## 2019-02-13 ENCOUNTER — Emergency Department
Admission: EM | Admit: 2019-02-13 | Discharge: 2019-02-13 | Disposition: A | Discharge status: Home / Self Care | Payer: Medicaid Other | Attending: Emergency Medicine | Admitting: Emergency Medicine

## 2019-02-13 ENCOUNTER — Emergency Department: Payer: Medicaid Other

## 2019-02-13 ENCOUNTER — Other Ambulatory Visit: Payer: Self-pay

## 2019-02-13 ENCOUNTER — Encounter: Payer: Self-pay | Admitting: Emergency Medicine

## 2019-02-13 DIAGNOSIS — F12188 Cannabis abuse with other cannabis-induced disorder: Secondary | ICD-10-CM | POA: Insufficient documentation

## 2019-02-13 DIAGNOSIS — R1084 Generalized abdominal pain: Secondary | ICD-10-CM | POA: Diagnosis present

## 2019-02-13 DIAGNOSIS — R1110 Vomiting, unspecified: Secondary | ICD-10-CM | POA: Diagnosis not present

## 2019-02-13 DIAGNOSIS — F1290 Cannabis use, unspecified, uncomplicated: Secondary | ICD-10-CM

## 2019-02-13 LAB — URINALYSIS, COMPLETE (UACMP) WITH MICROSCOPIC
Bacteria, UA: NONE SEEN
Bilirubin Urine: NEGATIVE
Glucose, UA: NEGATIVE mg/dL
Hgb urine dipstick: NEGATIVE
Ketones, ur: 20 mg/dL — AB
Leukocytes,Ua: NEGATIVE
Nitrite: NEGATIVE
Protein, ur: 30 mg/dL — AB
Specific Gravity, Urine: 1.023 (ref 1.005–1.030)
pH: 5 (ref 5.0–8.0)

## 2019-02-13 LAB — CBC WITH DIFFERENTIAL/PLATELET
Abs Immature Granulocytes: 0.06 10*3/uL (ref 0.00–0.07)
Basophils Absolute: 0 10*3/uL (ref 0.0–0.1)
Basophils Relative: 0 %
Eosinophils Absolute: 0 10*3/uL (ref 0.0–0.5)
Eosinophils Relative: 0 %
HCT: 35.9 % — ABNORMAL LOW (ref 36.0–46.0)
Hemoglobin: 11.8 g/dL — ABNORMAL LOW (ref 12.0–15.0)
Immature Granulocytes: 0 %
Lymphocytes Relative: 4 %
Lymphs Abs: 0.7 10*3/uL (ref 0.7–4.0)
MCH: 27.8 pg (ref 26.0–34.0)
MCHC: 32.9 g/dL (ref 30.0–36.0)
MCV: 84.7 fL (ref 80.0–100.0)
Monocytes Absolute: 0.3 10*3/uL (ref 0.1–1.0)
Monocytes Relative: 2 %
Neutro Abs: 15.3 10*3/uL — ABNORMAL HIGH (ref 1.7–7.7)
Neutrophils Relative %: 94 %
Platelets: 258 10*3/uL (ref 150–400)
RBC: 4.24 MIL/uL (ref 3.87–5.11)
RDW: 13.2 % (ref 11.5–15.5)
WBC: 16.3 10*3/uL — ABNORMAL HIGH (ref 4.0–10.5)
nRBC: 0 % (ref 0.0–0.2)

## 2019-02-13 LAB — URINE DRUG SCREEN, QUALITATIVE (ARMC ONLY)
Amphetamines, Ur Screen: NOT DETECTED
Barbiturates, Ur Screen: NOT DETECTED
Benzodiazepine, Ur Scrn: NOT DETECTED
Cannabinoid 50 Ng, Ur ~~LOC~~: POSITIVE — AB
Cocaine Metabolite,Ur ~~LOC~~: NOT DETECTED
MDMA (Ecstasy)Ur Screen: NOT DETECTED
Methadone Scn, Ur: NOT DETECTED
Opiate, Ur Screen: POSITIVE — AB
Phencyclidine (PCP) Ur S: NOT DETECTED
Tricyclic, Ur Screen: NOT DETECTED

## 2019-02-13 LAB — COMPREHENSIVE METABOLIC PANEL
ALT: 19 U/L (ref 0–44)
AST: 29 U/L (ref 15–41)
Albumin: 4.7 g/dL (ref 3.5–5.0)
Alkaline Phosphatase: 67 U/L (ref 38–126)
Anion gap: 15 (ref 5–15)
BUN: 11 mg/dL (ref 6–20)
CO2: 16 mmol/L — ABNORMAL LOW (ref 22–32)
Calcium: 9.9 mg/dL (ref 8.9–10.3)
Chloride: 109 mmol/L (ref 98–111)
Creatinine, Ser: 0.79 mg/dL (ref 0.44–1.00)
GFR calc Af Amer: >60 mL/min (ref >60)
GFR calc non Af Amer: >60 mL/min (ref >60)
Glucose, Bld: 133 mg/dL — ABNORMAL HIGH (ref 70–99)
Potassium: 3.1 mmol/L — ABNORMAL LOW (ref 3.5–5.1)
Sodium: 140 mmol/L (ref 135–145)
Total Bilirubin: 0.7 mg/dL (ref 0.3–1.2)
Total Protein: 8 g/dL (ref 6.5–8.1)

## 2019-02-13 LAB — CBC
HCT: 41.3 % (ref 36.0–46.0)
Hemoglobin: 13.6 g/dL (ref 12.0–15.0)
MCH: 27.5 pg (ref 26.0–34.0)
MCHC: 32.9 g/dL (ref 30.0–36.0)
MCV: 83.6 fL (ref 80.0–100.0)
Platelets: 399 10*3/uL (ref 150–400)
RBC: 4.94 MIL/uL (ref 3.87–5.11)
RDW: 13.2 % (ref 11.5–15.5)
WBC: 24.8 10*3/uL — ABNORMAL HIGH (ref 4.0–10.5)
nRBC: 0 % (ref 0.0–0.2)

## 2019-02-13 LAB — POCT PREGNANCY, URINE: Preg Test, Ur: NEGATIVE

## 2019-02-13 LAB — LIPASE, BLOOD: Lipase: 27 U/L (ref 11–51)

## 2019-02-13 MED ORDER — DICYCLOMINE HCL 10 MG PO CAPS
10.0000 mg | ORAL_CAPSULE | Freq: Once | ORAL | Status: AC
  Administered 2019-02-13: 19:00:00 10 mg via ORAL
  Filled 2019-02-13: qty 1

## 2019-02-13 MED ORDER — LORAZEPAM 2 MG/ML IJ SOLN
1.0000 mg | Freq: Once | INTRAMUSCULAR | Status: AC
  Administered 2019-02-13: 17:00:00 1 mg via INTRAVENOUS
  Filled 2019-02-13: qty 1

## 2019-02-13 MED ORDER — CAPSAICIN 0.025 % EX CREA
TOPICAL_CREAM | Freq: Once | CUTANEOUS | Status: AC
  Administered 2019-02-13: 18:00:00 via TOPICAL
  Filled 2019-02-13: qty 60

## 2019-02-13 MED ORDER — PROMETHAZINE HCL 25 MG/ML IJ SOLN: 25.0000 mg | Freq: Once | INTRAMUSCULAR | Status: DC

## 2019-02-13 MED ORDER — PROMETHAZINE HCL 12.5 MG PO TABS: 12.5000 mg | ORAL_TABLET | Freq: Four times a day (QID) | ORAL | 0 refills | Status: DC | PRN

## 2019-02-13 MED ORDER — IOHEXOL 300 MG/ML  SOLN
100.0000 mL | Freq: Once | INTRAMUSCULAR | Status: AC | PRN
  Administered 2019-02-13: 16:00:00 100 mL via INTRAVENOUS

## 2019-02-13 MED ORDER — ONDANSETRON HCL 4 MG/2ML IJ SOLN
4.0000 mg | Freq: Once | INTRAMUSCULAR | Status: AC | PRN
  Administered 2019-02-13: 14:00:00 4 mg via INTRAVENOUS
  Filled 2019-02-13: qty 2

## 2019-02-13 MED ORDER — CAPSAICIN 0.1 % EX CREA: 1.0000 "application " | TOPICAL_CREAM | Freq: Three times a day (TID) | CUTANEOUS | 1 refills | Status: DC | PRN

## 2019-02-13 MED ORDER — SODIUM CHLORIDE 0.9 % IV BOLUS
1000.0000 mL | Freq: Once | INTRAVENOUS | Status: AC
  Administered 2019-02-13: 17:00:00 1000 mL via INTRAVENOUS

## 2019-02-13 MED ORDER — MORPHINE SULFATE (PF) 4 MG/ML IV SOLN
4.0000 mg | Freq: Once | INTRAVENOUS | Status: AC
  Administered 2019-02-13: 15:00:00 4 mg via INTRAVENOUS
  Filled 2019-02-13: qty 1

## 2019-02-13 MED ORDER — IOHEXOL 300 MG/ML  SOLN: 30.0000 mL | Freq: Once | INTRAMUSCULAR | Status: DC | PRN

## 2019-02-13 MED ORDER — PROMETHAZINE HCL 25 MG/ML IJ SOLN
12.5000 mg | Freq: Once | INTRAMUSCULAR | Status: AC
  Administered 2019-02-13: 15:00:00 12.5 mg via INTRAVENOUS
  Filled 2019-02-13: qty 1

## 2019-02-13 NOTE — ED Triage Notes (Signed)
Pt arrived via POV with reports of abdominal pain, vomiting, pt states she was recently admitted for the same.   Pt states she vomited 5-6x.

## 2019-02-13 NOTE — Progress Notes (Signed)
MD originally ordered 0.075% Zostrix Cr. We only stock the 0.025% strength and MD was ok with that. Ordered changed.

## 2019-02-13 NOTE — ED Provider Notes (Signed)
[facility] Emergency Department Provider Note  ____________________________________________  Time seen: Approximately 2:21 PM  I have reviewed the triage vital signs and the nursing notes.   HISTORY  Chief Complaint Abdominal Pain    HPI Tiffany Guzman is a 23 y.o. female who presents the emergency department complaining of cyclic vomiting, diffuse abdominal pain, intermittent diarrhea and constipation.  Patient reports that she was admitted for similar complaint 11 days ago.  Patient reports that she had symptomatic improvement with medication, return home and followed up with her primary care.  Since then she has had gradually worsening symptoms into the last 2 to 3 days which has significantly increased her symptoms in regards to pain and vomiting.  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  Patient has not followed up with OB/GYN at this time for same.   Patient endorses mild dysuria but no polyuria.  No hematuria.  Patient denies any hematic emesis or hematochezia.  Patient denies any headache, neck pain or stiffness, chest pain, shortness of breath.  Patient denies any medications at home for this complaint.  Patient has a history of anxiety, ADHD, GERD, dermoid cyst.        Past Medical History:  Diagnosis Date  . Anxiety   . Attention deficit hyperactivity disorder (ADHD)   . Chronic upper back pain   . Depression   . GERD (gastroesophageal reflux disease)   . Gestational hypertension 11/14/2017  . Migraine with aura    since elementary school    Patient Active Problem List   Diagnosis Date Noted  . Status post cesarean delivery 11/14/2017  . 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 Anxiety 06/24/2016    Past Surgical History:  Procedure Laterality Date  . CESAREAN SECTION N/A 11/14/2017   Procedure: CESAREAN SECTION;  Surgeon: Will Bonnet, MD;  Location: [facility] ORS;  Service: Obstetrics;  Laterality: N/A;   . INSERTION OF NON VAGINAL CONTRACEPTIVE DEVICE    . OOPHORECTOMY Left 10/03/2017   Procedure: OOPHORECTOMY;  Surgeon: Homero Fellers, MD;  Location: [facility] ORS;  Service: Gynecology;  Laterality: Left;  . OVARIAN CYST REMOVAL Left 10/03/2017   Procedure: OVARIAN CYSTECTOMY;  Surgeon: Homero Fellers, MD;  Location: [facility] ORS;  Service: Gynecology;  Laterality: Left;    Prior to Admission medications   Medication Sig Start Date End Date Taking? Authorizing Provider  ketorolac (TORADOL) 10 MG tablet Take 10 mg by mouth every 6 (six) hours as needed.   Yes [provider]  ondansetron (ZOFRAN-ODT) 4 MG disintegrating tablet Take 4 mg by mouth every 8 (eight) hours as needed for nausea or vomiting.   Yes [provider]  Capsaicin 0.1 % CREA Apply 1 application topically 3 (three) times daily as needed (abd pain and emesis). 02/13/19   Cuthriell, Charline Bills, PA-C  promethazine (PHENERGAN) 12.5 MG tablet Take 1 tablet (12.5 mg total) by mouth every 6 (six) hours as needed for nausea or vomiting. 02/13/19   Cuthriell, Charline Bills, PA-C    Allergies Patient has no known allergies.  Family History  Problem Relation Age of Onset  . Hypertension Mother   . Hypothyroidism Mother   . Depression Mother   . Diabetes Maternal Grandmother     Social History Social History   Tobacco Use  . Smoking status: Never Smoker  . Smokeless tobacco: Never Used  Substance Use Topics  . Alcohol use: No    Alcohol/week: 0.0 standard drinks  .  Drug use: Yes    Frequency: 7.0 times per week    Types: Marijuana     Review of Systems  Constitutional: Denies fever/chills Eyes: No visual changes. No discharge ENT: No upper respiratory complaints. Cardiovascular: no chest pain. Respiratory: no cough. No SOB. Gastrointestinal: Diffuse abdominal pain.  Positive for nausea, vomiting, intermittent diarrhea and constipation.  Genitourinary: Positive for dysuria.  Denies polyuria.. No  hematuria Musculoskeletal: Negative for musculoskeletal pain. Skin: Negative for rash, abrasions, lacerations, ecchymosis. Neurological: Negative for headaches, focal weakness or numbness. 10-point ROS otherwise negative.  ____________________________________________   PHYSICAL EXAM:  VITAL SIGNS: ED Triage Vitals  Enc Vitals Group     BP 02/13/19 1335 135/81     Pulse Rate 02/13/19 1335 (!) 140     Resp 02/13/19 1335 18     Temp 02/13/19 1346 (!) 97.4 F (36.3 C)     Temp Source 02/13/19 1346 Oral     SpO2 02/13/19 1335 99 %     Weight 02/13/19 1332 130 lb (59 kg)     Height 02/13/19 1332 5\' 3"  (1.6 m)     Head Circumference --      Peak Flow --      Pain Score 02/13/19 1331 10     Pain Loc --      Pain Edu? --      Excl. in [place]? --      Constitutional: Alert and oriented.  Ill-appearing but in no acute distress. Eyes: Conjunctivae are normal. PERRL. EOMI. Head: Atraumatic. ENT:      Ears:       Nose: No congestion/rhinnorhea.      Mouth/Throat: Mucous membranes are moist.  Neck: No stridor.  Neck is supple full range of motion Hematological/Lymphatic/Immunilogical: No cervical lymphadenopathy. Cardiovascular: Normal rate, regular rhythm. Normal S1 and S2.  Good peripheral circulation. Respiratory: Normal respiratory effort without tachypnea or retractions. Lungs CTAB. Good air entry to the bases with no decreased or absent breath sounds. Gastrointestinal: Bowel sounds 4 quadrants.  Soft to palpation all quadrants.  Diffuse tenderness to palpation in all 4 quadrants and suprapubic region.  No specific point tenderness.  No rebound tenderness.  No significant increased tenderness over McBurney's point when compared with palpation of remaining quadrants.  No guarding or rigidity. No palpable masses. No distention. No CVA tenderness. Musculoskeletal: Full range of motion to all extremities. No gross deformities appreciated. Neurologic:  Normal speech and language. No gross  focal neurologic deficits are appreciated.  Skin:  Skin is warm, dry and intact. No rash noted. Psychiatric: Mood and affect are normal. Speech and behavior are normal. Patient exhibits appropriate insight and judgement.   ____________________________________________   LABS (all labs ordered are listed, but only abnormal results are displayed)  Labs Reviewed  COMPREHENSIVE METABOLIC PANEL - Abnormal; Notable for the following components:      Result Value   Potassium 3.1 (*)    CO2 16 (*)    Glucose, Bld 133 (*)    All other components within normal limits  CBC - Abnormal; Notable for the following components:   WBC 24.8 (*)    All other components within normal limits  URINALYSIS, COMPLETE (UACMP) WITH MICROSCOPIC - Abnormal; Notable for the following components:   Color, Urine YELLOW (*)    APPearance HAZY (*)    Ketones, ur 20 (*)    Protein, ur 30 (*)    All other components within normal limits  URINE DRUG SCREEN, QUALITATIVE ([facility] ONLY) - Abnormal;  Notable for the following components:   Opiate, Ur Screen POSITIVE (*)    Cannabinoid 50 Ng, Ur  POSITIVE (*)    All other components within normal limits  CBC WITH DIFFERENTIAL/PLATELET - Abnormal; Notable for the following components:   WBC 16.3 (*)    Hemoglobin 11.8 (*)    HCT 35.9 (*)    Neutro Abs 15.3 (*)    All other components within normal limits  GASTROINTESTINAL PANEL BY PCR, STOOL (REPLACES STOOL CULTURE)  C DIFFICILE QUICK SCREEN W PCR REFLEX  LIPASE, BLOOD  POC URINE PREG, ED  POCT PREGNANCY, URINE   ____________________________________________  EKG   ____________________________________________  RADIOLOGY I personally viewed and evaluated these images as part of my medical decision making, as well as reviewing the written report by the radiologist.  Ct Abdomen Pelvis W Contrast  Result Date: 02/13/2019 CLINICAL DATA:  Acute diffuse abdominal pain, cyclic vomiting, right-sided ovarian dermoid  EXAM: CT ABDOMEN AND PELVIS WITH CONTRAST TECHNIQUE: Multidetector CT imaging of the abdomen and pelvis was performed using the standard protocol following bolus administration of intravenous contrast. CONTRAST:  173mL OMNIPAQUE IOHEXOL 300 MG/ML  SOLN COMPARISON:  01/29/2019 FINDINGS: Lower chest: No acute abnormality. Hepatobiliary: No solid liver abnormality is seen. No gallstones, gallbladder wall thickening, or biliary dilatation. Pancreas: Unremarkable. No pancreatic ductal dilatation or surrounding inflammatory changes. Spleen: Normal in size without significant abnormality. Adrenals/Urinary Tract: Adrenal glands are unremarkable. Kidneys are normal, without renal calculi, solid lesion, or hydronephrosis. Bladder is unremarkable. Stomach/Bowel: Stomach is within normal limits. Appendix appears normal. No evidence of bowel wall thickening, distention, or inflammatory changes. Vascular/Lymphatic: No significant vascular findings are present. No enlarged abdominal or pelvic lymph nodes. Reproductive: Redemonstrated macroscopic fat containing right ovarian dermoid/teratoma. Small follicle or corpus luteum in the right ovary. Other: No abdominal wall hernia or abnormality. No abdominopelvic ascites. Musculoskeletal: No acute or significant osseous findings. IMPRESSION: 1. No acute CT findings of the abdomen or pelvis to explain right-sided abdominal pain. 2. Redemonstrated macroscopic fat containing right ovarian dermoid/teratoma. Small follicle or corpus luteum in the right ovary. 3.  Normal appendix. Electronically Signed   By: Eddie Candle M.D.   On: 02/13/2019 16:18    ____________________________________________    PROCEDURES  Procedure(s) performed:    Procedures    Medications  iohexol (OMNIPAQUE) 300 MG/ML solution 30 mL (has no administration in time range)  ondansetron (ZOFRAN) injection 4 mg (4 mg Intravenous Given 02/13/19 1344)  morphine 4 MG/ML injection 4 mg (4 mg Intravenous Given  02/13/19 1500)  promethazine (PHENERGAN) injection 12.5 mg (12.5 mg Intravenous Given 02/13/19 1454)  iohexol (OMNIPAQUE) 300 MG/ML solution 100 mL (100 mLs Intravenous Contrast Given 02/13/19 1549)  sodium chloride 0.9 % bolus 1,000 mL (1,000 mLs Intravenous New Bag/Given 02/13/19 1702)  LORazepam (ATIVAN) injection 1 mg (1 mg Intravenous Given 02/13/19 1702)  capsaicin (ZOSTRIX) 0.025 % cream ( Topical Given 02/13/19 1741)  dicyclomine (BENTYL) capsule 10 mg (10 mg Oral Given 02/13/19 1900)     ____________________________________________   INITIAL IMPRESSION / ASSESSMENT AND PLAN / ED COURSE  Pertinent labs & imaging results that were available during my care of the patient were reviewed by me and considered in my medical decision making (see chart for details).  Review of the [place] CSRS was performed in accordance of the [place] prior to dispensing any controlled drugs.  Clinical Course as of Feb 13 1923  Sat Feb 13, 2019  1426 Patient presented to the emergency department complaining of  diffuse abdominal pain, nausea, vomiting, intermittent diarrhea and constipation.  Patient was admitted to the hospital 11 days prior for similar symptoms.  At that time, work-up revealed right-sided ovarian dermoid cyst.  At this time, patient returns with diffuse abdominal pain.  No specific tenderness on palpation.  Patient is diffusely tender throughout all 4 quadrants.  Patient is having active emesis with bile colored emesis.  She has an elevated white blood cell count of 24.8.  Her potassium is slightly low at 3.1.  Otherwise, labs at this time are reassuring.  Still pending urinalysis.  With elevated white blood cell count, diffuse abdominal pain, return of symptoms I will evaluate the patient with a CT scan at this time.   [JC]  1429 Patient is tachycardic at this time.  She is afebrile, no hypotension.  Patient does have an elevated white blood cell count, I do not feel that the patient meets sepsis criteria  at this time.  I feel that tachycardia is most likely due to pain as well as probable mild dehydration.  Patient will be given IV fluids and reevaluated.   [JC]  1430 Differential at this time includes cholecystitis, appendicitis, gastroenteritis, UTI, pyelonephritis, nephrolithiasis, dermoid cyst, ovarian torsion, obstruction.   [JC]  [id number] Patient received IV Ativan with some improvement of her abdominal pain and emesis.  Using capsaicin cream, patient applied this to the abdominal wall and reports that within a few minutes she started to have significant improvement in her symptoms.  Patient is resting comfortably at this time.  She does report intermittent abdominal cramping but symptoms have significantly improved.  Will repeat CBC prior to discharge.   [JC]    Clinical Course User Index [JC] Cuthriell, Charline Bills, PA-C          Patient's diagnosis is consistent with cannabinoid hyperemesis syndrome.  Patient presented to the emergency department with diffuse abdominal pain, cyclic vomiting.  Patient had been seen 3 other times this month for similar symptoms.  Patient was admitted and improved with symptomatic medications.  Symptoms returned today patient presented to the emergency department.  Patient was diffusely tender to palpation throughout the abdomen without point specific tenderness.  Given patient's initial elevated white blood cell count, she was reevaluated with CT scan with no findings explain patient's symptoms.  With multiple previous visits, patient was positive for cannabis.  I discussed patient's use and she states that she is a heavy, daily user.  At this time, with multiple work-ups returning with reassuring results, I suspect that patient's symptoms are most consistent with cannabinoid hyperemesis syndrome.  As such, patient was given benzo and topical capsaicin.  Patient reports significant improvement with topical medication.  This further strengthens my supposition that  symptoms are secondary to cannabinoid hyperemesis.  Repeat CBC reveals significant improvement after fluids and her white blood cell count.  Exam remains reassuring as patient is resting comfortably with no symptoms at this time.  Patient will be prescribed further topical capsaicin as well as Phenergan for her symptoms.  I discussed at length my suspicion that patient's cannabis use is the underlying source of her symptoms.  Patient verbalizes understanding of same.  Patient is to follow-up with her primary care as well as GI. Patient is given ED precautions to return to the ED for any worsening or new symptoms.     ____________________________________________  FINAL CLINICAL IMPRESSION(S) / ED DIAGNOSES  Final diagnoses:  Cannabinoid hyperemesis syndrome ([place])      NEW MEDICATIONS STARTED  DURING THIS VISIT:  ED Discharge Orders         Ordered    Capsaicin 0.1 % CREA  3 times daily PRN     02/13/19 1924    promethazine (PHENERGAN) 12.5 MG tablet  Every 6 hours PRN     02/13/19 1924              This chart was dictated using voice recognition software/Dragon. Despite best efforts to proofread, errors can occur which can change the meaning. Any change was purely unintentional.    Darletta Moll, PA-C 02/13/19 1924    Earleen Newport, MD 02/13/19 Despina Pole

## 2019-02-13 NOTE — ED Notes (Signed)
Patient is sitting on stretcher, tachypnea with frequent dry heaves and has vomited a moderate amount of bile-colored emesis

## 2019-02-13 NOTE — ED Notes (Signed)
Patient c/o 9/10 abdominal pain and nausea. Patient appears calmer at this time. RR 18.

## 2019-02-13 NOTE — ED Notes (Signed)
Pt given capisin cream and gloves for self application.

## 2019-02-13 NOTE — ED Notes (Signed)
Pt actively vomiting in triage 

## 2019-02-15 ENCOUNTER — Other Ambulatory Visit: Payer: Self-pay

## 2019-02-15 ENCOUNTER — Emergency Department: Payer: Medicaid Other

## 2019-02-15 ENCOUNTER — Ambulatory Visit: Payer: Medicaid Other | Admitting: Obstetrics and Gynecology

## 2019-02-15 ENCOUNTER — Observation Stay: Payer: Medicaid Other

## 2019-02-15 ENCOUNTER — Inpatient Hospital Stay
Admission: EM | Admit: 2019-02-15 | Discharge: 2019-02-17 | DRG: 103 | Disposition: A | Discharge status: Home / Self Care | Payer: Medicaid Other | Attending: Internal Medicine | Admitting: Internal Medicine

## 2019-02-15 DIAGNOSIS — D279 Benign neoplasm of unspecified ovary: Secondary | ICD-10-CM | POA: Diagnosis not present

## 2019-02-15 DIAGNOSIS — R1084 Generalized abdominal pain: Secondary | ICD-10-CM | POA: Diagnosis present

## 2019-02-15 DIAGNOSIS — Z20828 Contact with and (suspected) exposure to other viral communicable diseases: Secondary | ICD-10-CM | POA: Diagnosis present

## 2019-02-15 DIAGNOSIS — T407X5A Adverse effect of cannabis (derivatives), initial encounter: Secondary | ICD-10-CM | POA: Diagnosis present

## 2019-02-15 DIAGNOSIS — R197 Diarrhea, unspecified: Secondary | ICD-10-CM | POA: Diagnosis not present

## 2019-02-15 DIAGNOSIS — I10 Essential (primary) hypertension: Secondary | ICD-10-CM | POA: Diagnosis present

## 2019-02-15 DIAGNOSIS — Z8742 Personal history of other diseases of the female genital tract: Secondary | ICD-10-CM

## 2019-02-15 DIAGNOSIS — F419 Anxiety disorder, unspecified: Secondary | ICD-10-CM | POA: Diagnosis present

## 2019-02-15 DIAGNOSIS — F909 Attention-deficit hyperactivity disorder, unspecified type: Secondary | ICD-10-CM | POA: Diagnosis present

## 2019-02-15 DIAGNOSIS — G43909 Migraine, unspecified, not intractable, without status migrainosus: Secondary | ICD-10-CM | POA: Diagnosis present

## 2019-02-15 DIAGNOSIS — R112 Nausea with vomiting, unspecified: Secondary | ICD-10-CM | POA: Diagnosis not present

## 2019-02-15 DIAGNOSIS — G43A0 Cyclical vomiting, not intractable: Principal | ICD-10-CM | POA: Diagnosis present

## 2019-02-15 DIAGNOSIS — R109 Unspecified abdominal pain: Secondary | ICD-10-CM

## 2019-02-15 DIAGNOSIS — E876 Hypokalemia: Secondary | ICD-10-CM | POA: Diagnosis present

## 2019-02-15 DIAGNOSIS — Z79899 Other long term (current) drug therapy: Secondary | ICD-10-CM

## 2019-02-15 DIAGNOSIS — K219 Gastro-esophageal reflux disease without esophagitis: Secondary | ICD-10-CM | POA: Diagnosis present

## 2019-02-15 DIAGNOSIS — Z791 Long term (current) use of non-steroidal anti-inflammatories (NSAID): Secondary | ICD-10-CM

## 2019-02-15 DIAGNOSIS — R102 Pelvic and perineal pain: Secondary | ICD-10-CM | POA: Diagnosis not present

## 2019-02-15 DIAGNOSIS — R1110 Vomiting, unspecified: Secondary | ICD-10-CM | POA: Diagnosis not present

## 2019-02-15 DIAGNOSIS — F329 Major depressive disorder, single episode, unspecified: Secondary | ICD-10-CM | POA: Diagnosis present

## 2019-02-15 DIAGNOSIS — F1220 Cannabis dependence, uncomplicated: Secondary | ICD-10-CM | POA: Diagnosis present

## 2019-02-15 DIAGNOSIS — M546 Pain in thoracic spine: Secondary | ICD-10-CM | POA: Diagnosis present

## 2019-02-15 DIAGNOSIS — E860 Dehydration: Secondary | ICD-10-CM | POA: Diagnosis not present

## 2019-02-15 LAB — URINE DRUG SCREEN, QUALITATIVE (ARMC ONLY)
Amphetamines, Ur Screen: NOT DETECTED
Barbiturates, Ur Screen: NOT DETECTED
Benzodiazepine, Ur Scrn: NOT DETECTED
Cannabinoid 50 Ng, Ur ~~LOC~~: POSITIVE — AB
Cocaine Metabolite,Ur ~~LOC~~: NOT DETECTED
MDMA (Ecstasy)Ur Screen: NOT DETECTED
Methadone Scn, Ur: NOT DETECTED
Opiate, Ur Screen: POSITIVE — AB
Phencyclidine (PCP) Ur S: NOT DETECTED
Tricyclic, Ur Screen: NOT DETECTED

## 2019-02-15 LAB — COMPREHENSIVE METABOLIC PANEL
ALT: 19 U/L (ref 0–44)
AST: 24 U/L (ref 15–41)
Albumin: 4.8 g/dL (ref 3.5–5.0)
Alkaline Phosphatase: 63 U/L (ref 38–126)
Anion gap: 12 (ref 5–15)
BUN: 18 mg/dL (ref 6–20)
CO2: 18 mmol/L — ABNORMAL LOW (ref 22–32)
Calcium: 9.3 mg/dL (ref 8.9–10.3)
Chloride: 109 mmol/L (ref 98–111)
Creatinine, Ser: 0.66 mg/dL (ref 0.44–1.00)
GFR calc Af Amer: >60 mL/min (ref >60)
GFR calc non Af Amer: >60 mL/min (ref >60)
Glucose, Bld: 119 mg/dL — ABNORMAL HIGH (ref 70–99)
Potassium: 3.5 mmol/L (ref 3.5–5.1)
Sodium: 139 mmol/L (ref 135–145)
Total Bilirubin: 0.5 mg/dL (ref 0.3–1.2)
Total Protein: 7.9 g/dL (ref 6.5–8.1)

## 2019-02-15 LAB — URINALYSIS, COMPLETE (UACMP) WITH MICROSCOPIC
Bacteria, UA: NONE SEEN
Bilirubin Urine: NEGATIVE
Glucose, UA: NEGATIVE mg/dL
Hgb urine dipstick: NEGATIVE
Ketones, ur: 20 mg/dL — AB
Leukocytes,Ua: NEGATIVE
Nitrite: NEGATIVE
Protein, ur: 30 mg/dL — AB
Specific Gravity, Urine: 1.023 (ref 1.005–1.030)
pH: 8 (ref 5.0–8.0)

## 2019-02-15 LAB — CBC
HCT: 39.6 % (ref 36.0–46.0)
Hemoglobin: 13 g/dL (ref 12.0–15.0)
MCH: 27.6 pg (ref 26.0–34.0)
MCHC: 32.8 g/dL (ref 30.0–36.0)
MCV: 84.1 fL (ref 80.0–100.0)
Platelets: 357 10*3/uL (ref 150–400)
RBC: 4.71 MIL/uL (ref 3.87–5.11)
RDW: 13.2 % (ref 11.5–15.5)
WBC: 13.9 10*3/uL — ABNORMAL HIGH (ref 4.0–10.5)
nRBC: 0 % (ref 0.0–0.2)

## 2019-02-15 LAB — SARS CORONAVIRUS 2 BY RT PCR (HOSPITAL ORDER, PERFORMED IN ~~LOC~~ HOSPITAL LAB): SARS Coronavirus 2: NEGATIVE

## 2019-02-15 LAB — MRSA PCR SCREENING: MRSA by PCR: NEGATIVE

## 2019-02-15 LAB — PREGNANCY, URINE: Preg Test, Ur: NEGATIVE

## 2019-02-15 LAB — LIPASE, BLOOD: Lipase: 32 U/L (ref 11–51)

## 2019-02-15 MED ORDER — ENOXAPARIN SODIUM 40 MG/0.4ML ~~LOC~~ SOLN
40.0000 mg | SUBCUTANEOUS | Status: DC
  Administered 2019-02-15 – 2019-02-16 (×2): 40 mg via SUBCUTANEOUS
  Filled 2019-02-15 (×2): qty 0.4

## 2019-02-15 MED ORDER — MORPHINE SULFATE (PF) 2 MG/ML IV SOLN
2.0000 mg | INTRAVENOUS | Status: DC | PRN
  Administered 2019-02-15 – 2019-02-17 (×10): 2 mg via INTRAVENOUS
  Filled 2019-02-15 (×10): qty 1

## 2019-02-15 MED ORDER — PROMETHAZINE HCL 25 MG/ML IJ SOLN
12.5000 mg | Freq: Four times a day (QID) | INTRAMUSCULAR | Status: DC | PRN
  Administered 2019-02-15 – 2019-02-17 (×7): 12.5 mg via INTRAVENOUS
  Filled 2019-02-15 (×7): qty 1

## 2019-02-15 MED ORDER — ONDANSETRON HCL 4 MG/2ML IJ SOLN
4.0000 mg | Freq: Once | INTRAMUSCULAR | Status: AC | PRN
  Administered 2019-02-15: 4 mg via INTRAVENOUS
  Filled 2019-02-15: qty 2

## 2019-02-15 MED ORDER — POTASSIUM CHLORIDE IN NACL 20-0.9 MEQ/L-% IV SOLN
INTRAVENOUS | Status: DC
  Administered 2019-02-15 – 2019-02-17 (×5): via INTRAVENOUS
  Filled 2019-02-15 (×7): qty 1000

## 2019-02-15 MED ORDER — MORPHINE SULFATE (PF) 4 MG/ML IV SOLN
4.0000 mg | Freq: Once | INTRAVENOUS | Status: AC
  Administered 2019-02-15: 4 mg via INTRAVENOUS
  Filled 2019-02-15: qty 1

## 2019-02-15 MED ORDER — ONDANSETRON HCL 4 MG/2ML IJ SOLN: 4.0000 mg | Freq: Four times a day (QID) | INTRAMUSCULAR | Status: DC | PRN

## 2019-02-15 MED ORDER — PROMETHAZINE HCL 25 MG/ML IJ SOLN
25.0000 mg | Freq: Once | INTRAMUSCULAR | Status: AC
  Administered 2019-02-15: 25 mg via INTRAVENOUS

## 2019-02-15 MED ORDER — SODIUM CHLORIDE 0.9% FLUSH
3.0000 mL | Freq: Once | INTRAVENOUS | Status: AC
  Administered 2019-02-15: 3 mL via INTRAVENOUS

## 2019-02-15 MED ORDER — HYDROMORPHONE HCL 1 MG/ML IJ SOLN
0.5000 mg | Freq: Once | INTRAMUSCULAR | Status: AC
  Administered 2019-02-15: 0.5 mg via INTRAVENOUS
  Filled 2019-02-15: qty 1

## 2019-02-15 MED ORDER — ACETAMINOPHEN 325 MG PO TABS: 650.0000 mg | ORAL_TABLET | Freq: Four times a day (QID) | ORAL | Status: DC | PRN

## 2019-02-15 MED ORDER — SODIUM CHLORIDE 0.9 % IV BOLUS
1000.0000 mL | Freq: Once | INTRAVENOUS | Status: AC
  Administered 2019-02-15: 1000 mL via INTRAVENOUS

## 2019-02-15 MED ORDER — ACETAMINOPHEN 650 MG RE SUPP: 650.0000 mg | Freq: Four times a day (QID) | RECTAL | Status: DC | PRN

## 2019-02-15 MED ORDER — PANTOPRAZOLE SODIUM 40 MG IV SOLR
40.0000 mg | INTRAVENOUS | Status: DC
  Administered 2019-02-15 – 2019-02-16 (×2): 40 mg via INTRAVENOUS
  Filled 2019-02-15 (×3): qty 40

## 2019-02-15 MED ORDER — ONDANSETRON HCL 4 MG PO TABS: 4.0000 mg | ORAL_TABLET | Freq: Four times a day (QID) | ORAL | Status: DC | PRN

## 2019-02-15 MED ORDER — HYDROMORPHONE HCL 1 MG/ML IJ SOLN
1.0000 mg | Freq: Once | INTRAMUSCULAR | Status: AC
  Administered 2019-02-15: 1 mg via INTRAVENOUS
  Filled 2019-02-15: qty 1

## 2019-02-15 MED ORDER — SENNOSIDES-DOCUSATE SODIUM 8.6-50 MG PO TABS: 1.0000 | ORAL_TABLET | Freq: Every evening | ORAL | Status: DC | PRN

## 2019-02-15 NOTE — ED Notes (Signed)
.. ED TO INPATIENT HANDOFF REPORT  ED Nurse Name and Phone #: Deneise Lever [phone]  S Name/Age/Gender Tiffany Guzman 23 y.o. female Room/Bed: ED19A/ED19A  Code Status   Code Status: Full Code  Home/SNF/Other Home Patient oriented to: self, place, time and situation Is this baseline? Yes   Triage Complete: Triage complete  Chief Complaint Emesis   Triage Note Pt here with lower abd pain with N/V/D for the past month, worse in the past 2 weeks, has been here for same sx, has appt with GYN today for follow up for ovarian cyst. Pt is dry heaving in triage.    Allergies No Known Allergies  Level of Care/Admitting Diagnosis ED Disposition    ED Disposition Condition [facility] Area: [place] [[id number]]  Level of Care: Med-Surg [16]  Covid Evaluation: Asymptomatic Screening Protocol (No Symptoms)  Diagnosis: Nausea and vomiting in adult [222979]  Admitting Physician: Saundra Shelling [[id number]]  Attending Physician: Saundra Shelling [[id number]]  PT Class (Do Not Modify): Observation [104]  PT Acc Code (Do Not Modify): Observation [10022]       B Medical/Surgery History Past Medical History:  Diagnosis Date  . Anxiety   . Attention deficit hyperactivity disorder (ADHD)   . Chronic upper back pain   . Depression   . GERD (gastroesophageal reflux disease)   . Gestational hypertension 11/14/2017  . Migraine with aura    since elementary school   Past Surgical History:  Procedure Laterality Date  . CESAREAN SECTION N/A 11/14/2017   Procedure: CESAREAN SECTION;  Surgeon: Will Bonnet, MD;  Location: [facility] ORS;  Service: Obstetrics;  Laterality: N/A;  . INSERTION OF NON VAGINAL CONTRACEPTIVE DEVICE    . OOPHORECTOMY Left 10/03/2017   Procedure: OOPHORECTOMY;  Surgeon: Homero Fellers, MD;  Location: [facility] ORS;  Service: Gynecology;  Laterality: Left;  . OVARIAN CYST REMOVAL Left 10/03/2017   Procedure: OVARIAN CYSTECTOMY;  Surgeon:  Homero Fellers, MD;  Location: [facility] ORS;  Service: Gynecology;  Laterality: Left;     A IV Location/Drains/Wounds Patient Lines/Drains/Airways Status   Active Line/Drains/Airways    Name:   Placement date:   Placement time:   Site:   Days:   Peripheral IV 02/15/19 Right Forearm   02/15/19    0946    Forearm   less than 1          Intake/Output Last 24 hours  Intake/Output Summary (Last 24 hours) at 02/15/2019 1638 Last data filed at 02/15/2019 1232 Gross per 24 hour  Intake 1000 ml  Output -  Net 1000 ml    Labs/Imaging Results for orders placed or performed during the hospital encounter of 02/15/19 (from the past 48 hour(s))  Urinalysis, Complete w Microscopic     Status: Abnormal   Collection Time: 02/15/19  9:40 AM  Result Value Ref Range   Color, Urine YELLOW (A) YELLOW   APPearance CLEAR (A) CLEAR   Specific Gravity, Urine 1.023 1.005 - 1.030   pH 8.0 5.0 - 8.0   Glucose, UA NEGATIVE NEGATIVE mg/dL   Hgb urine dipstick NEGATIVE NEGATIVE   Bilirubin Urine NEGATIVE NEGATIVE   Ketones, ur 20 (A) NEGATIVE mg/dL   Protein, ur 30 (A) NEGATIVE mg/dL   Nitrite NEGATIVE NEGATIVE   Leukocytes,Ua NEGATIVE NEGATIVE   RBC / HPF 0-5 0 - 5 RBC/hpf   WBC, UA 0-5 0 - 5 WBC/hpf   Bacteria, UA NONE SEEN NONE SEEN   Squamous Epithelial / LPF  0-5 0 - 5   Mucus PRESENT     Comment: Performed at [facility], [address] [place], [place] [address]  Lipase, blood     Status: None   Collection Time: 02/15/19  9:47 AM  Result Value Ref Range   Lipase 32 11 - 51 U/L    Comment: Performed at [facility], [address] [place], [place] [address]  Comprehensive metabolic panel     Status: Abnormal   Collection Time: 02/15/19  9:47 AM  Result Value Ref Range   Sodium 139 135 - 145 mmol/L   Potassium 3.5 3.5 - 5.1 mmol/L   Chloride 109 98 - 111 mmol/L   CO2 18 (L) 22 - 32 mmol/L   Glucose, Bld 119 (H) 70 - 99 mg/dL   BUN 18 6 - 20 mg/dL   Creatinine,  Ser 0.66 0.44 - 1.00 mg/dL   Calcium 9.3 8.9 - 10.3 mg/dL   Total Protein 7.9 6.5 - 8.1 g/dL   Albumin 4.8 3.5 - 5.0 g/dL   AST 24 15 - 41 U/L   ALT 19 0 - 44 U/L   Alkaline Phosphatase 63 38 - 126 U/L   Total Bilirubin 0.5 0.3 - 1.2 mg/dL   GFR calc non Af Amer >60 >60 mL/min   GFR calc Af Amer >60 >60 mL/min   Anion gap 12 5 - 15    Comment: Performed at [facility], [address] [place], [place] [address]  CBC     Status: Abnormal   Collection Time: 02/15/19  9:47 AM  Result Value Ref Range   WBC 13.9 (H) 4.0 - 10.5 K/uL   RBC 4.71 3.87 - 5.11 MIL/uL   Hemoglobin 13.0 12.0 - 15.0 g/dL   HCT 39.6 36.0 - 46.0 %   MCV 84.1 80.0 - 100.0 fL   MCH 27.6 26.0 - 34.0 pg   MCHC 32.8 30.0 - 36.0 g/dL   RDW 13.2 11.5 - 15.5 %   Platelets 357 150 - 400 K/uL   nRBC 0.0 0.0 - 0.2 %    Comment: Performed at [facility], [address] [place], [place] [address]  Urine Drug Screen, Qualitative     Status: Abnormal   Collection Time: 02/15/19 10:36 AM  Result Value Ref Range   Tricyclic, Ur Screen NONE DETECTED NONE DETECTED   Amphetamines, Ur Screen NONE DETECTED NONE DETECTED   MDMA (Ecstasy)Ur Screen NONE DETECTED NONE DETECTED   Cocaine Metabolite,Ur [place] NONE DETECTED NONE DETECTED   Opiate, Ur Screen POSITIVE (A) NONE DETECTED   Phencyclidine (PCP) Ur S NONE DETECTED NONE DETECTED   Cannabinoid 50 Ng, Ur [place] POSITIVE (A) NONE DETECTED   Barbiturates, Ur Screen NONE DETECTED NONE DETECTED   Benzodiazepine, Ur Scrn NONE DETECTED NONE DETECTED   Methadone Scn, Ur NONE DETECTED NONE DETECTED    Comment: (NOTE) Tricyclics + metabolites, urine    Cutoff 1000 ng/mL Amphetamines + metabolites, urine  Cutoff 1000 ng/mL MDMA (Ecstasy), urine              Cutoff 500 ng/mL Cocaine Metabolite, urine          Cutoff 300 ng/mL Opiate + metabolites, urine        Cutoff 300 ng/mL Phencyclidine (PCP), urine         Cutoff 25 ng/mL Cannabinoid, urine                 Cutoff 50  ng/mL Barbiturates +  metabolites, urine  Cutoff 200 ng/mL Benzodiazepine, urine              Cutoff 200 ng/mL Methadone, urine                   Cutoff 300 ng/mL The urine drug screen provides only a preliminary, unconfirmed analytical test result and should not be used for non-medical purposes. Clinical consideration and professional judgment should be applied to any positive drug screen result due to possible interfering substances. A more specific alternate chemical method must be used in order to obtain a confirmed analytical result. Gas chromatography / mass spectrometry (GC/MS) is the preferred confirmat ory method. Performed at [facility], [address] [place], [place] [address]   Pregnancy, urine     Status: None   Collection Time: 02/15/19 11:38 AM  Result Value Ref Range   Preg Test, Ur NEGATIVE NEGATIVE    Comment: Performed at [facility], [address] [place], [place] [address]   US Pelvis (transabdominal Only)  Result Date: 02/15/2019 CLINICAL DATA:  Pelvic pain and vomiting. History of an ovarian dermoid. History of left oophorectomy 10/03/2017. EXAM: TRANSABDOMINAL ULTRASOUND OF PELVIS DOPPLER ULTRASOUND OF OVARIES TECHNIQUE: Transabdominal ultrasound examination of the pelvis was performed including evaluation of the uterus, ovaries, adnexal regions, and pelvic cul-de-sac. Color and duplex Doppler ultrasound was utilized to evaluate blood flow to the ovaries. COMPARISON:  Pelvic ultrasound 02/02/2019. FINDINGS: Uterus Measurements: 8.5 x 4.9 x 6.7 cm = volume: 115.3 mL. No fibroids or other mass visualized. Endometrium Thickness: 0.6.  No focal abnormality visualized. Right ovary Measurements: 5.0 x 3.6 x 2.7 cm = volume: 25.3 mL. 2.1 x 2.2 x 2.3 cm echogenic lesion consistent with the patient's known dermoid is identified. Left ovary Removed. Pulsed Doppler evaluation demonstrates normal low-resistance arterial and venous waveforms in right  ovary. Other: None. IMPRESSION: No acute abnormality. Right ovarian dermoid is seen on prior exam. Status post left oophorectomy. Electronically Signed   By: Inge Rise M.D.   On: 02/15/2019 13:23   US Pelvic Doppler (torsion R/o Or Mass Arterial Flow)  Result Date: 02/15/2019 CLINICAL DATA:  Pelvic pain and vomiting. History of an ovarian dermoid. History of left oophorectomy 10/03/2017. EXAM: TRANSABDOMINAL ULTRASOUND OF PELVIS DOPPLER ULTRASOUND OF OVARIES TECHNIQUE: Transabdominal ultrasound examination of the pelvis was performed including evaluation of the uterus, ovaries, adnexal regions, and pelvic cul-de-sac. Color and duplex Doppler ultrasound was utilized to evaluate blood flow to the ovaries. COMPARISON:  Pelvic ultrasound 02/02/2019. FINDINGS: Uterus Measurements: 8.5 x 4.9 x 6.7 cm = volume: 115.3 mL. No fibroids or other mass visualized. Endometrium Thickness: 0.6.  No focal abnormality visualized. Right ovary Measurements: 5.0 x 3.6 x 2.7 cm = volume: 25.3 mL. 2.1 x 2.2 x 2.3 cm echogenic lesion consistent with the patient's known dermoid is identified. Left ovary Removed. Pulsed Doppler evaluation demonstrates normal low-resistance arterial and venous waveforms in right ovary. Other: None. IMPRESSION: No acute abnormality. Right ovarian dermoid is seen on prior exam. Status post left oophorectomy. Electronically Signed   By: Inge Rise M.D.   On: 02/15/2019 13:23   Dg Abdomen Acute W/chest  Result Date: 02/15/2019 CLINICAL DATA:  Nausea, vomiting and diarrhea this morning. History of ovarian cystectomy and oophorectomy. EXAM: DG ABDOMEN ACUTE W/ 1V CHEST COMPARISON:  Abdominal CT 01/29/2019. FINDINGS: The heart size and mediastinal contours are normal. The lungs are clear. There is no pleural effusion or pneumothorax. No acute osseous findings are identified. The  bowel gas pattern is normal. There is no free intraperitoneal air or suspicious calcification. Small pelvic  phleboliths are stable. The bones appear unremarkable. IMPRESSION: No evidence of active cardiopulmonary or abdominal process. Electronically Signed   By: Richardean Sale M.D.   On: 02/15/2019 16:33    Pending Labs Unresulted Labs (From admission, onward)    Start     Ordered   02/22/19 0500  Creatinine, serum  (enoxaparin (LOVENOX)    CrCl >/= 30 ml/min)  Weekly,   STAT    Comments: while on enoxaparin therapy    02/15/19 1623   02/16/19 3845  Basic metabolic panel  Tomorrow morning,   STAT     02/15/19 1623   02/16/19 0500  CBC  Tomorrow morning,   STAT     02/15/19 1623   02/15/19 1624  HIV antibody (Routine Testing)  Once,   STAT     02/15/19 1623   02/15/19 1624  CBC  (enoxaparin (LOVENOX)    CrCl >/= 30 ml/min)  Once,   STAT    Comments: Baseline for enoxaparin therapy IF NOT ALREADY DRAWN.  Notify MD if PLT < 100 K.    02/15/19 1623   02/15/19 1624  Creatinine, serum  (enoxaparin (LOVENOX)    CrCl >/= 30 ml/min)  Once,   STAT    Comments: Baseline for enoxaparin therapy IF NOT ALREADY DRAWN.    02/15/19 1623   02/15/19 1622  SARS Coronavirus 2 (CEPHEID - Performed in [place] hospital lab), [place]  (Asymptomatic Patients Labs)  Once,   STAT    Question:  Rule Out  Answer:  Yes   02/15/19 1621   02/15/19 1537  Gastrointestinal Panel by PCR , Stool  (Gastrointestinal Panel by PCR, Stool)  ONCE - STAT,   STAT     02/15/19 1537   02/15/19 1537  C difficile quick scan w PCR reflex  (C Difficile quick screen w PCR reflex panel)  Once, for 24 hours,   STAT     02/15/19 1537          Vitals/Pain Today's Vitals   02/15/19 1530 02/15/19 1545 02/15/19 1600 02/15/19 1612  BP:      Pulse: 84 77 78   Resp:      Temp:      TempSrc:      SpO2: 97% 98% 97%   Weight:      Height:      PainSc:    10-Worst pain ever    Isolation Precautions Enteric precautions (UV disinfection)  Medications Medications  enoxaparin (LOVENOX) injection 40 mg (has no administration in  time range)  0.9 % NaCl with KCl 20 mEq/ L  infusion (has no administration in time range)  acetaminophen (TYLENOL) tablet 650 mg (has no administration in time range)    Or  acetaminophen (TYLENOL) suppository 650 mg (has no administration in time range)  senna-docusate (Senokot-S) tablet 1 tablet (has no administration in time range)  morphine 2 MG/ML injection 2 mg (2 mg Intravenous Given 02/15/19 1620)  pantoprazole (PROTONIX) injection 40 mg (40 mg Intravenous Given 02/15/19 1621)  promethazine (PHENERGAN) injection 12.5 mg (has no administration in time range)  sodium chloride flush (NS) 0.9 % injection 3 mL (3 mLs Intravenous Given 02/15/19 1513)  ondansetron (ZOFRAN) injection 4 mg (4 mg Intravenous Given 02/15/19 1008)  sodium chloride 0.9 % bolus 1,000 mL (0 mLs Intravenous Stopped 02/15/19 1232)  morphine 4 MG/ML injection 4 mg (4 mg Intravenous  Given 02/15/19 1043)  HYDROmorphone (DILAUDID) injection 0.5 mg (0.5 mg Intravenous Given 02/15/19 1132)  promethazine (PHENERGAN) injection 25 mg (25 mg Intravenous Given 02/15/19 1221)  HYDROmorphone (DILAUDID) injection 0.5 mg (0.5 mg Intravenous Given 02/15/19 1513)    Mobility walks Low fall risk   Focused Assessments gi   R Recommendations: See Admitting Provider Note  Report given to:   Additional Notes:

## 2019-02-15 NOTE — ED Notes (Signed)
MD in room to assess patient.  Will continue to monitor.   

## 2019-02-15 NOTE — H&P (Signed)
[facility] NAME: Tiffany Guzman    MR#:  [id number]  DATE OF BIRTH:  Dec 25, 1995  DATE OF ADMISSION:  02/15/2019  PRIMARY CARE PHYSICIAN: Arnetha Courser, MD   REQUESTING/REFERRING PHYSICIAN:   CHIEF COMPLAINT:   Chief Complaint  Patient presents with  . Abdominal Pain    HISTORY OF PRESENT ILLNESS: Tiffany Guzman  is a 23 y.o. female with a known history of GERD, GERD stational hypertension, attention deficit hyperactivity disorder, migraine, ovarian dermoid presented to the emergency room for nausea vomiting and abdominal discomfort.  Patient has this nausea and vomiting going on for the last couple of days.  No recent travel or sick contacts at home.  She was worked up with pelvic ultrasound we did not show any acute pathology except ovarian dermoid.  Patient had GYN appointment today but missed appointment.  Abdominal x-rays pending.  C. difficile testing pending ordered by ER physician.  Nausea and vomiting has been going on for the last 1 month but has been worse for the last 1 week she.  Was evaluated for that in our hospital couple of weeks ago.  Patient also complained of diarrhea.  PAST MEDICAL HISTORY:   Past Medical History:  Diagnosis Date  . Anxiety   . Attention deficit hyperactivity disorder (ADHD)   . Chronic upper back pain   . Depression   . GERD (gastroesophageal reflux disease)   . Gestational hypertension 11/14/2017  . Migraine with aura    since elementary school    PAST SURGICAL HISTORY:  Past Surgical History:  Procedure Laterality Date  . CESAREAN SECTION N/A 11/14/2017   Procedure: CESAREAN SECTION;  Surgeon: Will Bonnet, MD;  Location: [facility] ORS;  Service: Obstetrics;  Laterality: N/A;  . INSERTION OF NON VAGINAL CONTRACEPTIVE DEVICE    . OOPHORECTOMY Left 10/03/2017   Procedure: OOPHORECTOMY;  Surgeon: Homero Fellers, MD;  Location: [facility] ORS;  Service: Gynecology;  Laterality:  Left;  . OVARIAN CYST REMOVAL Left 10/03/2017   Procedure: OVARIAN CYSTECTOMY;  Surgeon: Homero Fellers, MD;  Location: [facility] OR[facility];  Service: Gynecology;  Laterality: Left;    SOCIAL HISTORY:  Social History   Tobacco Use  . Smoking status: Never Smoker  . Smokeless tobacco: Never Used  Substance Use Topics  . Alcohol use: No    Alcohol/week: 0.0 standard drinks    FAMILY HISTORY:  Family History  Problem Relation Age of Onset  . Hypertension Mother   . Hypothyroidism Mother   . Depression Mother   . Diabetes Maternal Grandmother     DRUG ALLERGIES: No Known Allergies  REVIEW OF SYSTEMS:   CONSTITUTIONAL: No fever, has fatigue and weakness.  EYES: No blurred or double vision.  EARS, NOSE, AND THROAT: No tinnitus or ear pain.  RESPIRATORY: No cough, shortness of breath, wheezing or hemoptysis.  CARDIOVASCULAR: No chest pain, orthopnea, edema.  GASTROINTESTINAL: Has nausea, vomiting, diarrhea  mild abdominal pain.  GENITOURINARY: No dysuria, hematuria.  ENDOCRINE: No polyuria, nocturia,  HEMATOLOGY: No anemia, easy bruising or bleeding SKIN: No rash or lesion. MUSCULOSKELETAL: No joint pain or arthritis.   NEUROLOGIC: No tingling, numbness, weakness.  PSYCHIATRY: No anxiety or depression.   MEDICATIONS AT HOME:  Prior to Admission medications   Medication Sig Start Date End Date Taking? Authorizing Provider  Capsaicin 0.1 % CREA Apply 1 application topically 3 (three) times daily as needed (abd pain and emesis). 02/13/19   Cuthriell, Charline Bills, PA-C  ketorolac (TORADOL) 10 MG tablet Take 10 mg by mouth every 6 (six) hours as needed.    [provider]  ondansetron (ZOFRAN-ODT) 4 MG disintegrating tablet Take 4 mg by mouth every 8 (eight) hours as needed for nausea or vomiting.    [provider]  promethazine (PHENERGAN) 12.5 MG tablet Take 1 tablet (12.5 mg total) by mouth every 6 (six) hours as needed for nausea or vomiting. 02/13/19   Cuthriell,  Charline Bills, PA-C      PHYSICAL EXAMINATION:   VITAL SIGNS: Blood pressure (!) 149/101, pulse 78, temperature 99.4 F (37.4 C), temperature source Oral, resp. rate (!) 25, height 5\' 4"  (1.626 m), weight 59 kg, last menstrual period 02/13/2019, SpO2 97 %, not currently breastfeeding.  GENERAL:  23 y.o.-year-old patient lying in the bed with no acute distress.  EYES: Pupils equal, round, reactive to light and accommodation. No scleral icterus. Extraocular muscles intact.  HEENT: Head atraumatic, normocephalic. Oropharynx dry and nasopharynx clear.  NECK:  Supple, no jugular venous distention. No thyroid enlargement, no tenderness.  LUNGS: Normal breath sounds bilaterally, no wheezing, rales,rhonchi or crepitation. No use of accessory muscles of respiration.  CARDIOVASCULAR: S1, S2 normal. No murmurs, rubs, or gallops.  ABDOMEN: Soft, mild tenderness around umbilicus, nondistended. Bowel sounds present. No organomegaly or mass.  EXTREMITIES: No pedal edema, cyanosis, or clubbing.  NEUROLOGIC: Cranial nerves II through XII are intact. Muscle strength 5/5 in all extremities. Sensation intact. Gait not checked.  PSYCHIATRIC: The patient is alert and oriented x 3.  SKIN: No obvious rash, lesion, or ulcer.   LABORATORY PANEL:   CBC Recent Labs  Lab 02/13/19 1341 02/13/19 1848 02/15/19 0947  WBC 24.8* 16.3* 13.9*  HGB 13.6 11.8* 13.0  HCT 41.3 35.9* 39.6  PLT 399 258 357  MCV 83.6 84.7 84.1  MCH 27.5 27.8 27.6  MCHC 32.9 32.9 32.8  RDW 13.2 13.2 13.2  LYMPHSABS  --  0.7  --   MONOABS  --  0.3  --   EOSABS  --  0.0  --   BASOSABS  --  0.0  --    ------------------------------------------------------------------------------------------------------------------  Chemistries  Recent Labs  Lab 02/13/19 1341 02/15/19 0947  NA 140 139  K 3.1* 3.5  CL 109 109  CO2 16* 18*  GLUCOSE 133* 119*  BUN 11 18  CREATININE 0.79 0.66  CALCIUM 9.9 9.3  AST 29 24  ALT 19 19  ALKPHOS 67 63   BILITOT 0.7 0.5   ------------------------------------------------------------------------------------------------------------------ estimated creatinine clearance is 94.4 mL/min (by C-G formula based on SCr of 0.66 mg/dL). ------------------------------------------------------------------------------------------------------------------ No results for input(s): TSH, T4TOTAL, T3FREE, THYROIDAB in the last 72 hours.  Invalid input(s): FREET3   Coagulation profile No results for input(s): INR, PROTIME in the last 168 hours. ------------------------------------------------------------------------------------------------------------------- No results for input(s): DDIMER in the last 72 hours. -------------------------------------------------------------------------------------------------------------------  Cardiac Enzymes No results for input(s): CKMB, TROPONINI, MYOGLOBIN in the last 168 hours.  Invalid input(s): CK ------------------------------------------------------------------------------------------------------------------ Invalid input(s): POCBNP  ---------------------------------------------------------------------------------------------------------------  Urinalysis    Component Value Date/Time   COLORURINE YELLOW (A) 02/15/2019 0940   APPEARANCEUR CLEAR (A) 02/15/2019 0940   APPEARANCEUR Cloudy 06/26/2014 1232   LABSPEC 1.023 02/15/2019 0940   LABSPEC 1.024 06/26/2014 1232   PHURINE 8.0 02/15/2019 0940   GLUCOSEU NEGATIVE 02/15/2019 0940   GLUCOSEU Negative 06/26/2014 1232   HGBUR NEGATIVE 02/15/2019 0940   BILIRUBINUR NEGATIVE 02/15/2019 0940   BILIRUBINUR negative 03/30/2015 1355   BILIRUBINUR Negative 06/26/2014 1232   KETONESUR  20 (A) 02/15/2019 0940   PROTEINUR 30 (A) 02/15/2019 0940   UROBILINOGEN 0.2 03/30/2015 1355   NITRITE NEGATIVE 02/15/2019 0940   LEUKOCYTESUR NEGATIVE 02/15/2019 0940   LEUKOCYTESUR Negative 06/26/2014 1232     RADIOLOGY: US  Pelvis (transabdominal Only)  Result Date: 02/15/2019 CLINICAL DATA:  Pelvic pain and vomiting. History of an ovarian dermoid. History of left oophorectomy 10/03/2017. EXAM: TRANSABDOMINAL ULTRASOUND OF PELVIS DOPPLER ULTRASOUND OF OVARIES TECHNIQUE: Transabdominal ultrasound examination of the pelvis was performed including evaluation of the uterus, ovaries, adnexal regions, and pelvic cul-de-sac. Color and duplex Doppler ultrasound was utilized to evaluate blood flow to the ovaries. COMPARISON:  Pelvic ultrasound 02/02/2019. FINDINGS: Uterus Measurements: 8.5 x 4.9 x 6.7 cm = volume: 115.3 mL. No fibroids or other mass visualized. Endometrium Thickness: 0.6.  No focal abnormality visualized. Right ovary Measurements: 5.0 x 3.6 x 2.7 cm = volume: 25.3 mL. 2.1 x 2.2 x 2.3 cm echogenic lesion consistent with the patient's known dermoid is identified. Left ovary Removed. Pulsed Doppler evaluation demonstrates normal low-resistance arterial and venous waveforms in right ovary. Other: None. IMPRESSION: No acute abnormality. Right ovarian dermoid is seen on prior exam. Status post left oophorectomy. Electronically Signed   By: Inge Rise M.D.   On: 02/15/2019 13:23   US Pelvic Doppler (torsion R/o Or Mass Arterial Flow)  Result Date: 02/15/2019 CLINICAL DATA:  Pelvic pain and vomiting. History of an ovarian dermoid. History of left oophorectomy 10/03/2017. EXAM: TRANSABDOMINAL ULTRASOUND OF PELVIS DOPPLER ULTRASOUND OF OVARIES TECHNIQUE: Transabdominal ultrasound examination of the pelvis was performed including evaluation of the uterus, ovaries, adnexal regions, and pelvic cul-de-sac. Color and duplex Doppler ultrasound was utilized to evaluate blood flow to the ovaries. COMPARISON:  Pelvic ultrasound 02/02/2019. FINDINGS: Uterus Measurements: 8.5 x 4.9 x 6.7 cm = volume: 115.3 mL. No fibroids or other mass visualized. Endometrium Thickness: 0.6.  No focal abnormality visualized. Right ovary Measurements:  5.0 x 3.6 x 2.7 cm = volume: 25.3 mL. 2.1 x 2.2 x 2.3 cm echogenic lesion consistent with the patient's known dermoid is identified. Left ovary Removed. Pulsed Doppler evaluation demonstrates normal low-resistance arterial and venous waveforms in right ovary. Other: None. IMPRESSION: No acute abnormality. Right ovarian dermoid is seen on prior exam. Status post left oophorectomy. Electronically Signed   By: Inge Rise M.D.   On: 02/15/2019 13:23    EKG: Orders placed or performed during the hospital encounter of 01/02/16  . EKG 12-Lead  . EKG 12-Lead    IMPRESSION AND PLAN: 23 year old female patient with a known history of GERD, GERD, gestational hypertension, attention deficit hyperactivity disorder, migraine, ovarian dermoid presented to the emergency room for nausea vomiting and abdominal discomfort.   -Intractable nausea vomiting Admit patient to medical floor IV fluid hydration Antiemetics intravenously  -Dehydration IV fluids  -History of ovarian dermoid Outpatient gynecology appointment  -Abdominal pain Control pain with IV morphine as needed Check abdominal x-ray to rule out any ileus  -DVT prophylaxis subcu Lovenox daily  -GERD Continue PPI  All the records are reviewed and case discussed with ED provider. Management plans discussed with the patient, family and they are in agreement.  CODE STATUS: Full code Code Status History    Date Active Date Inactive Code Status Order ID Comments User Context   02/02/2019 0526 02/03/2019 1916 Full Code [id number]  Harrie Foreman, MD Inpatient   11/15/2017 1743 11/18/2017 1908 Full Code [id number]  Dalia Heading, [place] Inpatient   11/14/2017 1326 11/15/2017 1743 Full Code [id number]  Will Bonnet, MD Inpatient   10/03/2017 1401 10/07/2017 2045 Full Code [id number]  Homero Fellers, MD Inpatient   10/01/2017 1947 10/03/2017 1400 Full Code [id number]  Gae Dry, MD Inpatient   09/04/2017 2242 09/05/2017 1741 Full  Code [id number]  Malachy Mood, MD Inpatient   09/04/2017 1724 09/04/2017 2242 Full Code [id number]  Malachy Mood, MD Inpatient   Advance Care Planning Activity       TOTAL TIME TAKING CARE OF THIS PATIENT: 53 minutes.    Saundra Shelling M.D on 02/15/2019 at 4:21 PM  Between 7am to 6pm - Pager - [phone]  After 6pm go to [email or web address] - password [email or web address] [facility]  Office  [phone]  CC: Primary care physician; Arnetha Courser, MD

## 2019-02-15 NOTE — ED Provider Notes (Addendum)
[facility] Emergency Department Provider Note   ____________________________________________   First MD Initiated Contact with Patient 02/15/19 1037     (approximate)  I have reviewed the triage vital signs and the nursing notes.   HISTORY  Chief Complaint Abdominal Pain    HPI Tiffany Guzman is a 23 y.o. female who has intermittent episodes of nausea vomiting diarrhea with severe lower abdominal crampy pains.  She has a known cyst on the ovary.  Symptoms have returned yesterday after having been gone for day or 2.         Past Medical History:  Diagnosis Date  . Anxiety   . Attention deficit hyperactivity disorder (ADHD)   . Chronic upper back pain   . Depression   . GERD (gastroesophageal reflux disease)   . Gestational hypertension 11/14/2017  . Migraine with aura    since elementary school    Patient Active Problem List   Diagnosis Date Noted  . Status post cesarean delivery 11/14/2017  . 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 Anxiety 06/24/2016    Past Surgical History:  Procedure Laterality Date  . CESAREAN SECTION N/A 11/14/2017   Procedure: CESAREAN SECTION;  Surgeon: Will Bonnet, MD;  Location: [facility] ORS;  Service: Obstetrics;  Laterality: N/A;  . INSERTION OF NON VAGINAL CONTRACEPTIVE DEVICE    . OOPHORECTOMY Left 10/03/2017   Procedure: OOPHORECTOMY;  Surgeon: Homero Fellers, MD;  Location: [facility] ORS;  Service: Gynecology;  Laterality: Left;  . OVARIAN CYST REMOVAL Left 10/03/2017   Procedure: OVARIAN CYSTECTOMY;  Surgeon: Homero Fellers, MD;  Location: [facility] ORS;  Service: Gynecology;  Laterality: Left;    Prior to Admission medications   Medication Sig Start Date End Date Taking? Authorizing Provider  Capsaicin 0.1 % CREA Apply 1 application topically 3 (three) times daily as needed (abd pain and emesis). 02/13/19   Cuthriell, Charline Bills, PA-C  ketorolac (TORADOL) 10 MG tablet Take 10 mg by mouth  every 6 (six) hours as needed.    [provider]  ondansetron (ZOFRAN-ODT) 4 MG disintegrating tablet Take 4 mg by mouth every 8 (eight) hours as needed for nausea or vomiting.    [provider]  promethazine (PHENERGAN) 12.5 MG tablet Take 1 tablet (12.5 mg total) by mouth every 6 (six) hours as needed for nausea or vomiting. 02/13/19   Cuthriell, Charline Bills, PA-C    Allergies Patient has no known allergies.  Family History  Problem Relation Age of Onset  . Hypertension Mother   . Hypothyroidism Mother   . Depression Mother   . Diabetes Maternal Grandmother     Social History Social History   Tobacco Use  . Smoking status: Never Smoker  . Smokeless tobacco: Never Used  Substance Use Topics  . Alcohol use: No    Alcohol/week: 0.0 standard drinks  . Drug use: Yes    Frequency: 7.0 times per week    Types: Marijuana    Review of Systems  Constitutional: No fever/chills Eyes: No visual changes. ENT: No sore throat. Cardiovascular: Denies chest pain. Respiratory: Denies shortness of breath. Gastrointestinal:  abdominal pain.   nausea,  vomiting.   diarrhea.  No constipation. Genitourinary: Negative for dysuria. Musculoskeletal: Negative for back pain. Skin: Negative for rash. Neurological: Negative for headaches, focal weakness  ____________________________________________   PHYSICAL EXAM:  VITAL SIGNS: ED Triage Vitals  Enc Vitals Group     BP 02/15/19 0925 (!) 141/92     Pulse  Rate 02/15/19 0925 (!) 114     Resp 02/15/19 0925 (!) 32     Temp 02/15/19 0925 99.4 F (37.4 C)     Temp Source 02/15/19 0925 Oral     SpO2 02/15/19 0925 100 %     Weight 02/15/19 0928 130 lb (59 kg)     Height 02/15/19 0928 5\' 4"  (1.626 m)     Head Circumference --      Peak Flow --      Pain Score 02/15/19 0938 10     Pain Loc --      Pain Edu? --      Excl. in [place]? --     Constitutional: Alert and oriented. W ill-appearing and moaning in pain initially  Eyes: Conjunctivae are normal.  Head: Atraumatic. Nose: No congestion/rhinnorhea. Mouth/Throat: Mucous membranes are moist.  Oropharynx non-erythematous. Neck: No stridor.   Cardiovascular: Normal rate, regular rhythm. Grossly normal heart sounds.  Good peripheral circulation. Respiratory: Normal respiratory effort.  No retractions. Lungs CTAB. Gastrointestinal: Soft tender. No distention. No abdominal bruits. No CVA tenderness. Musculoskeletal: No lower extremity tenderness nor edema.  Neurologic:  Normal speech and language. No gross focal neurologic deficits are appreciated.  Skin:  Skin is warm, dry and intact. No rash noted.   ____________________________________________   LABS (all labs ordered are listed, but only abnormal results are displayed)  Labs Reviewed  COMPREHENSIVE METABOLIC PANEL - Abnormal; Notable for the following components:      Result Value   CO2 18 (*)    Glucose, Bld 119 (*)    All other components within normal limits  CBC - Abnormal; Notable for the following components:   WBC 13.9 (*)    All other components within normal limits  LIPASE, BLOOD  URINALYSIS, COMPLETE (UACMP) WITH MICROSCOPIC  URINE DRUG SCREEN, QUALITATIVE ([facility] ONLY)  POC URINE PREG, ED   ____________________________________________  EKG   ____________________________________________  RADIOLOGY  ED MD interpretation: Ultrasound read by radiology shows only the known dermoid.  Official radiology report(s): No results found.  ____________________________________________   PROCEDURES  Procedure(s) performed (including Critical Care):  Procedures   ____________________________________________   INITIAL IMPRESSION / ASSESSMENT AND PLAN / ED COURSE  After several doses of pain medication and nausea medication patient looks better but says she still feels very bad.  She does not look well but she looks better and less in pain.  She still very nauseated worried about  trying to drink anything.  We will get her in the hospital.  I will get a acute abdominal series just to be sure, CT was 2 days ago. Tiffany Guzman was evaluated in Emergency Department on 02/15/2019 for the symptoms described in the history of present illness. She was evaluated in the context of the global COVID-19 pandemic, which necessitated consideration that the patient might be at risk for infection with the SARS-CoV-2 virus that causes COVID-19. Institutional protocols and algorithms that pertain to the evaluation of patients at risk for COVID-19 are in a state of rapid change based on information released by regulatory bodies including the CDC and federal and state organizations. These policies and algorithms were followed during the patient's care in the ED.              ____________________________________________   FINAL CLINICAL IMPRESSION(S) / ED DIAGNOSES  Final diagnoses:  Pelvic pain  Vomiting, intractability of vomiting not specified, presence of nausea not specified, unspecified vomiting type  History of ovarian cyst  ED Discharge Orders    None       Note:  This document was prepared using Dragon voice recognition software and may include unintentional dictation errors.    Nena Polio, MD 02/15/19 1539    Nena Polio, MD 02/15/19 [phone]

## 2019-02-15 NOTE — ED Notes (Signed)
Pt aware that stool sample is needed

## 2019-02-15 NOTE — Progress Notes (Signed)
MD notified: The patient arrived to the room from the ED and is complaining of pain 10/10 in abdomen. The pain radiates to the back and chest. Patient reports cramping and tighness. She last given 2mg  of morphin at 1620. Would you like to order any additional pain meds.

## 2019-02-15 NOTE — ED Triage Notes (Signed)
Pt here with lower abd pain with N/V/D for the past month, worse in the past 2 weeks, has been here for same sx, has appt with GYN today for follow up for ovarian cyst. Pt is dry heaving in triage.

## 2019-02-15 NOTE — ED Notes (Signed)
Pt still c/o pain 8/10 at this time. MD aware.

## 2019-02-15 NOTE — Progress Notes (Signed)
Advanced care plan. Purpose of the Encounter: CODE STATUS Parties in Attendance: Patient Patient's Decision Capacity: Good Subjective/Patient's story: Tiffany Guzman  is a 23 y.o. female with a known history of GERD, GERD stational hypertension, attention deficit hyperactivity disorder, migraine, ovarian dermoid presented to the emergency room for nausea vomiting and abdominal discomfort.  Patient has this nausea and vomiting going on for the last couple of days.  No recent travel or sick contacts at home.  She was worked up with pelvic ultrasound we did not show any acute pathology except ovarian dermoid Objective/Medical story Patient needs evaluation with pelvic ultrasound abdominal x-ray. Needs IV fluids for dehydration. Antiemetics Goals of care determination:  Advance care directives goals of care treatment plan discussed For now patient is full resuscitation CODE STATUS: Full code Time spent discussing advanced care planning: 16 minutes

## 2019-02-16 DIAGNOSIS — R102 Pelvic and perineal pain: Secondary | ICD-10-CM | POA: Diagnosis not present

## 2019-02-16 DIAGNOSIS — R109 Unspecified abdominal pain: Secondary | ICD-10-CM

## 2019-02-16 DIAGNOSIS — Z79899 Other long term (current) drug therapy: Secondary | ICD-10-CM | POA: Diagnosis not present

## 2019-02-16 DIAGNOSIS — R197 Diarrhea, unspecified: Secondary | ICD-10-CM | POA: Diagnosis present

## 2019-02-16 DIAGNOSIS — R112 Nausea with vomiting, unspecified: Secondary | ICD-10-CM

## 2019-02-16 DIAGNOSIS — K219 Gastro-esophageal reflux disease without esophagitis: Secondary | ICD-10-CM | POA: Diagnosis not present

## 2019-02-16 DIAGNOSIS — R1084 Generalized abdominal pain: Secondary | ICD-10-CM | POA: Diagnosis present

## 2019-02-16 DIAGNOSIS — T407X5A Adverse effect of cannabis (derivatives), initial encounter: Secondary | ICD-10-CM | POA: Diagnosis present

## 2019-02-16 DIAGNOSIS — E876 Hypokalemia: Secondary | ICD-10-CM | POA: Diagnosis present

## 2019-02-16 DIAGNOSIS — E860 Dehydration: Secondary | ICD-10-CM | POA: Diagnosis not present

## 2019-02-16 DIAGNOSIS — I10 Essential (primary) hypertension: Secondary | ICD-10-CM | POA: Diagnosis present

## 2019-02-16 DIAGNOSIS — Z791 Long term (current) use of non-steroidal anti-inflammatories (NSAID): Secondary | ICD-10-CM | POA: Diagnosis not present

## 2019-02-16 DIAGNOSIS — D279 Benign neoplasm of unspecified ovary: Secondary | ICD-10-CM | POA: Diagnosis not present

## 2019-02-16 DIAGNOSIS — Z20828 Contact with and (suspected) exposure to other viral communicable diseases: Secondary | ICD-10-CM | POA: Diagnosis present

## 2019-02-16 DIAGNOSIS — F1220 Cannabis dependence, uncomplicated: Secondary | ICD-10-CM | POA: Diagnosis present

## 2019-02-16 DIAGNOSIS — F329 Major depressive disorder, single episode, unspecified: Secondary | ICD-10-CM | POA: Diagnosis present

## 2019-02-16 DIAGNOSIS — G43A0 Cyclical vomiting, not intractable: Secondary | ICD-10-CM | POA: Diagnosis present

## 2019-02-16 DIAGNOSIS — G43909 Migraine, unspecified, not intractable, without status migrainosus: Secondary | ICD-10-CM | POA: Diagnosis present

## 2019-02-16 DIAGNOSIS — F909 Attention-deficit hyperactivity disorder, unspecified type: Secondary | ICD-10-CM | POA: Diagnosis present

## 2019-02-16 DIAGNOSIS — M546 Pain in thoracic spine: Secondary | ICD-10-CM | POA: Diagnosis present

## 2019-02-16 DIAGNOSIS — D271 Benign neoplasm of left ovary: Secondary | ICD-10-CM | POA: Diagnosis not present

## 2019-02-16 DIAGNOSIS — F419 Anxiety disorder, unspecified: Secondary | ICD-10-CM | POA: Diagnosis present

## 2019-02-16 DIAGNOSIS — R1110 Vomiting, unspecified: Secondary | ICD-10-CM | POA: Diagnosis not present

## 2019-02-16 LAB — BASIC METABOLIC PANEL
Anion gap: 6 (ref 5–15)
BUN: 11 mg/dL (ref 6–20)
CO2: 22 mmol/L (ref 22–32)
Calcium: 8.2 mg/dL — ABNORMAL LOW (ref 8.9–10.3)
Chloride: 112 mmol/L — ABNORMAL HIGH (ref 98–111)
Creatinine, Ser: 0.67 mg/dL (ref 0.44–1.00)
GFR calc Af Amer: >60 mL/min (ref >60)
GFR calc non Af Amer: >60 mL/min (ref >60)
Glucose, Bld: 89 mg/dL (ref 70–99)
Potassium: 3.7 mmol/L (ref 3.5–5.1)
Sodium: 140 mmol/L (ref 135–145)

## 2019-02-16 LAB — CBC
HCT: 32.3 % — ABNORMAL LOW (ref 36.0–46.0)
Hemoglobin: 10.3 g/dL — ABNORMAL LOW (ref 12.0–15.0)
MCH: 27.5 pg (ref 26.0–34.0)
MCHC: 31.9 g/dL (ref 30.0–36.0)
MCV: 86.4 fL (ref 80.0–100.0)
Platelets: 253 10*3/uL (ref 150–400)
RBC: 3.74 MIL/uL — ABNORMAL LOW (ref 3.87–5.11)
RDW: 13.3 % (ref 11.5–15.5)
WBC: 9.8 10*3/uL (ref 4.0–10.5)
nRBC: 0 % (ref 0.0–0.2)

## 2019-02-16 MED ORDER — DICYCLOMINE HCL 10 MG PO CAPS
10.0000 mg | ORAL_CAPSULE | Freq: Three times a day (TID) | ORAL | Status: DC
  Administered 2019-02-16 – 2019-02-17 (×2): 10 mg via ORAL
  Filled 2019-02-16 (×4): qty 1

## 2019-02-16 MED ORDER — BOOST / RESOURCE BREEZE PO LIQD CUSTOM
1.0000 | Freq: Three times a day (TID) | ORAL | Status: DC
  Administered 2019-02-16 (×2): 1 via ORAL

## 2019-02-16 MED ORDER — ADULT MULTIVITAMIN W/MINERALS CH: 1.0000 | ORAL_TABLET | Freq: Every day | ORAL | Status: DC

## 2019-02-16 NOTE — Progress Notes (Signed)
[facility] [place] NAME: Tiffany Guzman    MR#:  [id number]  DATE OF BIRTH:  [birth year]  SUBJECTIVE:  CHIEF COMPLAINT:   Chief Complaint  Patient presents with  . Abdominal Pain   -Patient upset about recurring ER visits and admissions for nausea vomiting and abdominal pain.  Symptoms seem slightly better today  REVIEW OF SYSTEMS:  Review of Systems  Constitutional: Negative for chills, fever and malaise/fatigue.  HENT: Negative for congestion, ear discharge, hearing loss and nosebleeds.   Eyes: Negative for blurred vision and double vision.  Respiratory: Negative for cough, shortness of breath and wheezing.   Cardiovascular: Negative for chest pain and palpitations.  Gastrointestinal: Positive for abdominal pain, nausea and vomiting. Negative for constipation and diarrhea.  Genitourinary: Negative for dysuria.  Musculoskeletal: Negative for myalgias.  Neurological: Negative for dizziness, focal weakness, seizures, weakness and headaches.  Psychiatric/Behavioral: Negative for depression.    DRUG ALLERGIES:  No Known Allergies  VITALS:  Blood pressure 130/83, pulse 72, temperature 98 F (36.7 C), temperature source Oral, resp. rate 18, height 5\' 4"  (1.626 m), weight 59 kg, last menstrual period 02/13/2019, SpO2 100 %, not currently breastfeeding.  PHYSICAL EXAMINATION:  Physical Exam   GENERAL:  23 y.o.-year-old patient lying in the bed with no acute distress.  EYES: Pupils equal, round, reactive to light and accommodation. No scleral icterus. Extraocular muscles intact.  HEENT: Head atraumatic, normocephalic. Oropharynx and nasopharynx clear.  NECK:  Supple, no jugular venous distention. No thyroid enlargement, no tenderness.  LUNGS: Normal breath sounds bilaterally, no wheezing, rales,rhonchi or crepitation. No use of accessory muscles of respiration.  CARDIOVASCULAR: S1, S2 normal. No murmurs, rubs, or gallops.  ABDOMEN:  Soft, generalized abdominal tenderness-worse in the periumbilical region, with no guarding or rigidity, nondistended. Bowel sounds present. No organomegaly or mass.  EXTREMITIES: No pedal edema, cyanosis, or clubbing.  NEUROLOGIC: Cranial nerves II through XII are intact. Muscle strength 5/5 in all extremities. Sensation intact. Gait not checked.  PSYCHIATRIC: The patient is alert and oriented x 3.  SKIN: No obvious rash, lesion, or ulcer.    LABORATORY PANEL:   CBC Recent Labs  Lab 02/16/19 0529  WBC 9.8  HGB 10.3*  HCT 32.3*  PLT 253   ------------------------------------------------------------------------------------------------------------------  Chemistries  Recent Labs  Lab 02/15/19 0947 02/16/19 0529  NA 139 140  K 3.5 3.7  CL 109 112*  CO2 18* 22  GLUCOSE 119* 89  BUN 18 11  CREATININE 0.66 0.67  CALCIUM 9.3 8.2*  AST 24  --   ALT 19  --   ALKPHOS 63  --   BILITOT 0.5  --    ------------------------------------------------------------------------------------------------------------------  Cardiac Enzymes No results for input(s): TROPONINI in the last 168 hours. ------------------------------------------------------------------------------------------------------------------  RADIOLOGY:  US Pelvis (transabdominal Only)  Result Date: 02/15/2019 CLINICAL DATA:  Pelvic pain and vomiting. History of an ovarian dermoid. History of left oophorectomy 10/03/2017. EXAM: TRANSABDOMINAL ULTRASOUND OF PELVIS DOPPLER ULTRASOUND OF OVARIES TECHNIQUE: Transabdominal ultrasound examination of the pelvis was performed including evaluation of the uterus, ovaries, adnexal regions, and pelvic cul-de-sac. Color and duplex Doppler ultrasound was utilized to evaluate blood flow to the ovaries. COMPARISON:  Pelvic ultrasound 02/02/2019. FINDINGS: Uterus Measurements: 8.5 x 4.9 x 6.7 cm = volume: 115.3 mL. No fibroids or other mass visualized. Endometrium Thickness: 0.6.  No focal  abnormality visualized. Right ovary Measurements: 5.0 x 3.6 x 2.7 cm = volume: 25.3 mL. 2.1 x 2.2 x 2.3  cm echogenic lesion consistent with the patient's known dermoid is identified. Left ovary Removed. Pulsed Doppler evaluation demonstrates normal low-resistance arterial and venous waveforms in right ovary. Other: None. IMPRESSION: No acute abnormality. Right ovarian dermoid is seen on prior exam. Status post left oophorectomy. Electronically Signed   By: Inge Rise M.D.   On: 02/15/2019 13:23   US Pelvic Doppler (torsion R/o Or Mass Arterial Flow)  Result Date: 02/15/2019 CLINICAL DATA:  Pelvic pain and vomiting. History of an ovarian dermoid. History of left oophorectomy 10/03/2017. EXAM: TRANSABDOMINAL ULTRASOUND OF PELVIS DOPPLER ULTRASOUND OF OVARIES TECHNIQUE: Transabdominal ultrasound examination of the pelvis was performed including evaluation of the uterus, ovaries, adnexal regions, and pelvic cul-de-sac. Color and duplex Doppler ultrasound was utilized to evaluate blood flow to the ovaries. COMPARISON:  Pelvic ultrasound 02/02/2019. FINDINGS: Uterus Measurements: 8.5 x 4.9 x 6.7 cm = volume: 115.3 mL. No fibroids or other mass visualized. Endometrium Thickness: 0.6.  No focal abnormality visualized. Right ovary Measurements: 5.0 x 3.6 x 2.7 cm = volume: 25.3 mL. 2.1 x 2.2 x 2.3 cm echogenic lesion consistent with the patient's known dermoid is identified. Left ovary Removed. Pulsed Doppler evaluation demonstrates normal low-resistance arterial and venous waveforms in right ovary. Other: None. IMPRESSION: No acute abnormality. Right ovarian dermoid is seen on prior exam. Status post left oophorectomy. Electronically Signed   By: Inge Rise M.D.   On: 02/15/2019 13:23   Dg Abdomen Acute W/chest  Result Date: 02/15/2019 CLINICAL DATA:  Nausea, vomiting and diarrhea this morning. History of ovarian cystectomy and oophorectomy. EXAM: DG ABDOMEN ACUTE W/ 1V CHEST COMPARISON:  Abdominal CT  01/29/2019. FINDINGS: The heart size and mediastinal contours are normal. The lungs are clear. There is no pleural effusion or pneumothorax. No acute osseous findings are identified. The bowel gas pattern is normal. There is no free intraperitoneal air or suspicious calcification. Small pelvic phleboliths are stable. The bones appear unremarkable. IMPRESSION: No evidence of active cardiopulmonary or abdominal process. Electronically Signed   By: Richardean Sale M.D.   On: 02/15/2019 16:33    EKG:   Orders placed or performed during the hospital encounter of 01/02/16  . EKG 12-Lead  . EKG 12-Lead    ASSESSMENT AND PLAN:   23 year old female with past medical history significant for ADHD, GERD, migraines presents to hospital secondary to worsening abdominal pain and nausea vomiting.  1.  Recurrent abdominal pain with nausea and vomiting- -CT of the abdomen and pelvis without any acute findings.  Right ovarian dermoid cyst/teratoma noted. -Patient mentions that during her C-section about a year and half ago, she had left ovarian cyst that was resected. -No torsion noted.  Urine tox positive for marijuana. -Symptoms started about 2 to 3 weeks ago and patient has had 3 ER visits and 2 admissions for the same. -OB/GYN consult requested. -Symptoms also look like it could be IBS.  GI consult requested -Continue clear liquid diet and advance as tolerated -Conservative management for nausea vomiting.  Added Bentyl for abdominal pain -Has intermittent constipation and diarrhea.  Discontinue contact isolation  2.  Hypokalemia-being replaced  3.  GERD-PPI  4.  DVT prophylaxis-on Lovenox    All the records are reviewed and case discussed with Care Management/Social Workerr. Management plans discussed with the patient, family and they are in agreement.  CODE STATUS: Full code  TOTAL TIME TAKING CARE OF THIS PATIENT: 36 minutes.   POSSIBLE D/C IN 1-2 DAYS, DEPENDING ON CLINICAL CONDITION.    Gladstone Lighter  M.D on 02/16/2019 at 3:20 PM  Between 7am to 6pm - Pager - [phone]  After 6pm go to [email or web address] - password [email or web address] [facility]  Office  [phone]  CC: Primary care physician; Arnetha Courser, MD

## 2019-02-16 NOTE — Consult Note (Signed)
Vonda Antigua, MD [address], [place], [place], [place], [address] [address], [place], [place], [place], [address] Phone: [phone]  Fax: [phone]  Consultation  Referring Provider:     Dr. Tressia Miners Primary Care Physician:  Arnetha Courser, MD Reason for Consultation:     Nausea vomiting, abdominal pain  Date of Admission:  02/15/2019 Date of Consultation:  02/16/2019         HPI:   Tiffany Guzman is a 23 y.o. female with history of ADHD, GERD, migraines, was recurrent ER visits for recurrent abdominal pain with nausea and vomiting.  No hematemesis.  Pt reports intermittent symptoms for 7 years with midabdominal pain associated with N/V. Dull, 5/10, non radiating. No diarrhea. No previous GI workup.   U tox positive for marijuana.  CT abdomen pelvis without any acute findings.  Right ovarian dermoid cyst/teratoma noted.  OB/GYN consult pending.  Past Medical History:  Diagnosis Date   Anxiety    Attention deficit hyperactivity disorder (ADHD)    Chronic upper back pain    Depression    GERD (gastroesophageal reflux disease)    Gestational hypertension 11/14/2017   Migraine with aura    since elementary school    Past Surgical History:  Procedure Laterality Date   CESAREAN SECTION N/A 11/14/2017   Procedure: CESAREAN SECTION;  Surgeon: Will Bonnet, MD;  Location: [facility] ORS;  Service: Obstetrics;  Laterality: N/A;   INSERTION OF NON VAGINAL CONTRACEPTIVE DEVICE     OOPHORECTOMY Left 10/03/2017   Procedure: OOPHORECTOMY;  Surgeon: Homero Fellers, MD;  Location: [facility] ORS;  Service: Gynecology;  Laterality: Left;   OVARIAN CYST REMOVAL Left 10/03/2017   Procedure: OVARIAN CYSTECTOMY;  Surgeon: Homero Fellers, MD;  Location: [facility] ORS;  Service: Gynecology;  Laterality: Left;    Prior to Admission medications   Medication Sig Start Date End Date Taking? Authorizing Provider  Capsaicin 0.1 % CREA Apply 1 application  topically 3 (three) times daily as needed (abd pain and emesis). 02/13/19   Cuthriell, Charline Bills, PA-C  ketorolac (TORADOL) 10 MG tablet Take 10 mg by mouth every 6 (six) hours as needed.    [provider]  ondansetron (ZOFRAN-ODT) 4 MG disintegrating tablet Take 4 mg by mouth every 8 (eight) hours as needed for nausea or vomiting.    [provider]  promethazine (PHENERGAN) 12.5 MG tablet Take 1 tablet (12.5 mg total) by mouth every 6 (six) hours as needed for nausea or vomiting. 02/13/19   Cuthriell, Charline Bills, PA-C    Family History  Problem Relation Age of Onset   Hypertension Mother    Hypothyroidism Mother    Depression Mother    Diabetes Maternal Grandmother      Social History   Tobacco Use   Smoking status: Never Smoker   Smokeless tobacco: Never Used  Substance Use Topics   Alcohol use: No    Alcohol/week: 0.0 standard drinks   Drug use: Yes    Frequency: 7.0 times per week    Types: Marijuana    Allergies as of 02/15/2019   (No Known Allergies)    Review of Systems:    All systems reviewed and negative except where noted in HPI.   Physical Exam:  Vital signs in last 24 hours: Vitals:   02/15/19 1630 02/15/19 1736 02/15/19 2121 02/16/19 0356  BP: (!) 157/87 133/82 119/83 130/83  Pulse: 81 86 69 72  Resp: 20 18 18 18   Temp: 98.9  F (37.2 C) 98.5 F (36.9 C) 98.5 F (36.9 C) 98 F (36.7 C)  TempSrc: Oral Oral Oral Oral  SpO2: 98% 100% 100% 100%  Weight:      Height:       Last BM Date: 02/15/19 General:   Pleasant, cooperative in NAD Head:  Normocephalic and atraumatic. Eyes:   No icterus.   Conjunctiva pink. PERRLA. Ears:  Normal auditory acuity. Neck:  Supple; no masses or thyroidomegaly Lungs: Respirations even and unlabored. Lungs clear to auscultation bilaterally.   No wheezes, crackles, or rhonchi.  Abdomen:  Soft, nondistended, nontender. Normal bowel sounds. No appreciable masses or hepatomegaly.  No rebound or  guarding.  Neurologic:  Alert and oriented x3;  grossly normal neurologically. Skin:  Intact without significant lesions or rashes. Cervical Nodes:  No significant cervical adenopathy. Psych:  Alert and cooperative. Normal affect.  LAB RESULTS: Recent Labs    02/13/19 1848 02/15/19 0947 02/16/19 0529  WBC 16.3* 13.9* 9.8  HGB 11.8* 13.0 10.3*  HCT 35.9* 39.6 32.3*  PLT 258 357 253   BMET Recent Labs    02/15/19 0947 02/16/19 0529  NA 139 140  K 3.5 3.7  CL 109 112*  CO2 18* 22  GLUCOSE 119* 89  BUN 18 11  CREATININE 0.66 0.67  CALCIUM 9.3 8.2*   LFT Recent Labs    02/15/19 0947  PROT 7.9  ALBUMIN 4.8  AST 24  ALT 19  ALKPHOS 63  BILITOT 0.5   PT/INR No results for input(s): LABPROT, INR in the last 72 hours.  STUDIES: US Pelvis (transabdominal Only)  Result Date: 02/15/2019 CLINICAL DATA:  Pelvic pain and vomiting. History of an ovarian dermoid. History of left oophorectomy 10/03/2017. EXAM: TRANSABDOMINAL ULTRASOUND OF PELVIS DOPPLER ULTRASOUND OF OVARIES TECHNIQUE: Transabdominal ultrasound examination of the pelvis was performed including evaluation of the uterus, ovaries, adnexal regions, and pelvic cul-de-sac. Color and duplex Doppler ultrasound was utilized to evaluate blood flow to the ovaries. COMPARISON:  Pelvic ultrasound 02/02/2019. FINDINGS: Uterus Measurements: 8.5 x 4.9 x 6.7 cm = volume: 115.3 mL. No fibroids or other mass visualized. Endometrium Thickness: 0.6.  No focal abnormality visualized. Right ovary Measurements: 5.0 x 3.6 x 2.7 cm = volume: 25.3 mL. 2.1 x 2.2 x 2.3 cm echogenic lesion consistent with the patient's known dermoid is identified. Left ovary Removed. Pulsed Doppler evaluation demonstrates normal low-resistance arterial and venous waveforms in right ovary. Other: None. IMPRESSION: No acute abnormality. Right ovarian dermoid is seen on prior exam. Status post left oophorectomy. Electronically Signed   By: Inge Rise M.D.   On:  02/15/2019 13:23   US Pelvic Doppler (torsion R/o Or Mass Arterial Flow)  Result Date: 02/15/2019 CLINICAL DATA:  Pelvic pain and vomiting. History of an ovarian dermoid. History of left oophorectomy 10/03/2017. EXAM: TRANSABDOMINAL ULTRASOUND OF PELVIS DOPPLER ULTRASOUND OF OVARIES TECHNIQUE: Transabdominal ultrasound examination of the pelvis was performed including evaluation of the uterus, ovaries, adnexal regions, and pelvic cul-de-sac. Color and duplex Doppler ultrasound was utilized to evaluate blood flow to the ovaries. COMPARISON:  Pelvic ultrasound 02/02/2019. FINDINGS: Uterus Measurements: 8.5 x 4.9 x 6.7 cm = volume: 115.3 mL. No fibroids or other mass visualized. Endometrium Thickness: 0.6.  No focal abnormality visualized. Right ovary Measurements: 5.0 x 3.6 x 2.7 cm = volume: 25.3 mL. 2.1 x 2.2 x 2.3 cm echogenic lesion consistent with the patient's known dermoid is identified. Left ovary Removed. Pulsed Doppler evaluation demonstrates normal low-resistance arterial and venous waveforms in right  ovary. Other: None. IMPRESSION: No acute abnormality. Right ovarian dermoid is seen on prior exam. Status post left oophorectomy. Electronically Signed   By: Inge Rise M.D.   On: 02/15/2019 13:23   Dg Abdomen Acute W/chest  Result Date: 02/15/2019 CLINICAL DATA:  Nausea, vomiting and diarrhea this morning. History of ovarian cystectomy and oophorectomy. EXAM: DG ABDOMEN ACUTE W/ 1V CHEST COMPARISON:  Abdominal CT 01/29/2019. FINDINGS: The heart size and mediastinal contours are normal. The lungs are clear. There is no pleural effusion or pneumothorax. No acute osseous findings are identified. The bowel gas pattern is normal. There is no free intraperitoneal air or suspicious calcification. Small pelvic phleboliths are stable. The bones appear unremarkable. IMPRESSION: No evidence of active cardiopulmonary or abdominal process. Electronically Signed   By: Richardean Sale M.D.   On: 02/15/2019  16:33      Impression / Plan:   Tiffany Guzman is a 23 y.o. y/o female with abdominal pain, nausea and vomiting  Patient symptoms of nausea vomiting are likely due to marijuana use and hyperemesis syndrome  She was advised to abstain from marijuana use  However, given ongoing abdominal pain and recurrent ER visits, EGD would be useful in ruling out any underlying lesions  Agree with GYN consult to evaluate ovarian lesion  Patient has been started on Protonix once daily which is reasonable as well  I have discussed alternative options, risks & benefits,  which include, but are not limited to, bleeding, infection, perforation,respiratory complication & drug reaction.  The patient agrees with this plan & written consent will be obtained.     Thank you for involving me in the care of this patient.      LOS: 0 days   Virgel Manifold, MD  02/16/2019, 4:17 PM

## 2019-02-16 NOTE — Progress Notes (Signed)
Initial Nutrition Assessment  DOCUMENTATION CODES:   Not applicable  INTERVENTION:   Boost Breeze po TID, each supplement provides 250 kcal and 9 grams of protein  MVI daily   Pt at high refeed risk; monitor K, Mg and P labs daily   NUTRITION DIAGNOSIS:   Inadequate oral intake related to acute illness as evidenced by per patient/family report.  GOAL:   Patient will meet greater than or equal to 90% of their needs  MONITOR:   PO intake, Supplement acceptance, Labs, Weight trends, Skin, I & O's  REASON FOR ASSESSMENT:   Malnutrition Screening Tool    ASSESSMENT:   23 year old female patient with a known history of GERD, GERD, gestational hypertension, attention deficit hyperactivity disorder, migraine, ovarian dermoid presented to the emergency room for nausea vomiting and abdominal discomfort.   Pt reports poor appetite and oral intake for 1 month r/t nausea, vomiting and diarrhea. Pt continues to have poor oral intake in hospital. RD will add supplements and MVI to help pt meet her estimated needs. Suspect pt is at refeed risk; recommend monitor electrolytes. Per chart, pt with weight gain pta.    RD working remotely.  Medications reviewed and include: lovenox, protonix, NaCl w/ KCl @100ml /hr   Labs reviewed: K 3.7 wnl  Unable to complete Nutrition-Focused physical exam at this time as pt is on contact precautions.   Diet Order:   Diet Order            Diet clear liquid Room service appropriate? Yes; Fluid consistency: Thin  Diet effective now             EDUCATION NEEDS:   No education needs have been identified at this time  Skin:  Skin Assessment: Reviewed RN Assessment  Last BM:  7/27- type 7  Height:   Ht Readings from Last 1 Encounters:  02/15/19 5\' 4"  (1.626 m)    Weight:   Wt Readings from Last 1 Encounters:  02/15/19 59 kg    Ideal Body Weight:  54.5 kg  BMI:  Body mass index is 22.31 kg/m.  Estimated Nutritional Needs:    Kcal:  1500-1700kcal/day  Protein:  75-85g/day  Fluid:  >1.6L/day  Koleen Distance MS, RD, LDN Pager #- [phone] Office#- [phone] After Hours Pager: [phone]

## 2019-02-17 ENCOUNTER — Encounter: Payer: Self-pay | Admitting: *Deleted

## 2019-02-17 ENCOUNTER — Inpatient Hospital Stay: Payer: Medicaid Other | Admitting: Anesthesiology

## 2019-02-17 ENCOUNTER — Telehealth: Payer: Self-pay | Admitting: Obstetrics and Gynecology

## 2019-02-17 ENCOUNTER — Encounter
Admission: EM | Disposition: A | Discharge status: Home / Self Care | Payer: Self-pay | Source: Home / Self Care | Attending: Internal Medicine

## 2019-02-17 DIAGNOSIS — E860 Dehydration: Secondary | ICD-10-CM | POA: Diagnosis not present

## 2019-02-17 DIAGNOSIS — R109 Unspecified abdominal pain: Secondary | ICD-10-CM

## 2019-02-17 DIAGNOSIS — R112 Nausea with vomiting, unspecified: Secondary | ICD-10-CM | POA: Diagnosis not present

## 2019-02-17 DIAGNOSIS — D271 Benign neoplasm of left ovary: Secondary | ICD-10-CM | POA: Diagnosis not present

## 2019-02-17 DIAGNOSIS — D279 Benign neoplasm of unspecified ovary: Secondary | ICD-10-CM | POA: Diagnosis not present

## 2019-02-17 DIAGNOSIS — R1110 Vomiting, unspecified: Secondary | ICD-10-CM | POA: Diagnosis not present

## 2019-02-17 DIAGNOSIS — K219 Gastro-esophageal reflux disease without esophagitis: Secondary | ICD-10-CM | POA: Diagnosis not present

## 2019-02-17 HISTORY — PX: ESOPHAGOGASTRODUODENOSCOPY: SHX5428

## 2019-02-17 LAB — BASIC METABOLIC PANEL
Anion gap: 7 (ref 5–15)
BUN: 8 mg/dL (ref 6–20)
CO2: 24 mmol/L (ref 22–32)
Calcium: 8.6 mg/dL — ABNORMAL LOW (ref 8.9–10.3)
Chloride: 108 mmol/L (ref 98–111)
Creatinine, Ser: 0.66 mg/dL (ref 0.44–1.00)
GFR calc Af Amer: >60 mL/min (ref >60)
GFR calc non Af Amer: >60 mL/min (ref >60)
Glucose, Bld: 87 mg/dL (ref 70–99)
Potassium: 4 mmol/L (ref 3.5–5.1)
Sodium: 139 mmol/L (ref 135–145)

## 2019-02-17 LAB — HIV ANTIBODY (ROUTINE TESTING W REFLEX): HIV Screen 4th Generation wRfx: NONREACTIVE

## 2019-02-17 SURGERY — EGD (ESOPHAGOGASTRODUODENOSCOPY)
Anesthesia: General | Laterality: Left

## 2019-02-17 MED ORDER — LIDOCAINE HCL (PF) 2 % IJ SOLN
INTRAMUSCULAR | Status: AC
  Filled 2019-02-17: qty 10

## 2019-02-17 MED ORDER — PROPOFOL 500 MG/50ML IV EMUL
INTRAVENOUS | Status: DC | PRN
  Administered 2019-02-17: 140 ug/kg/min via INTRAVENOUS

## 2019-02-17 MED ORDER — DICYCLOMINE HCL 10 MG PO CAPS: 10.0000 mg | ORAL_CAPSULE | Freq: Three times a day (TID) | ORAL | 0 refills | Status: DC

## 2019-02-17 MED ORDER — FENTANYL CITRATE (PF) 100 MCG/2ML IJ SOLN
INTRAMUSCULAR | Status: AC
  Filled 2019-02-17: qty 2

## 2019-02-17 MED ORDER — PROPOFOL 500 MG/50ML IV EMUL
INTRAVENOUS | Status: AC
  Filled 2019-02-17: qty 50

## 2019-02-17 MED ORDER — MIDAZOLAM HCL 2 MG/2ML IJ SOLN
INTRAMUSCULAR | Status: AC
  Filled 2019-02-17: qty 2

## 2019-02-17 MED ORDER — PROPOFOL 10 MG/ML IV BOLUS
INTRAVENOUS | Status: DC | PRN
  Administered 2019-02-17 (×2): 15 mg via INTRAVENOUS
  Administered 2019-02-17: 70 mg via INTRAVENOUS

## 2019-02-17 MED ORDER — LIDOCAINE HCL (CARDIAC) PF 100 MG/5ML IV SOSY
PREFILLED_SYRINGE | INTRAVENOUS | Status: DC | PRN
  Administered 2019-02-17: 50 mg via INTRAVENOUS

## 2019-02-17 MED ORDER — MIDAZOLAM HCL 2 MG/2ML IJ SOLN
INTRAMUSCULAR | Status: DC | PRN
  Administered 2019-02-17: 2 mg via INTRAVENOUS

## 2019-02-17 MED ORDER — FENTANYL CITRATE (PF) 100 MCG/2ML IJ SOLN
INTRAMUSCULAR | Status: DC | PRN
  Administered 2019-02-17 (×2): 50 ug via INTRAVENOUS

## 2019-02-17 MED ORDER — SODIUM CHLORIDE 0.9 % IV SOLN
INTRAVENOUS | Status: DC
  Administered 2019-02-17: 12:00:00 via INTRAVENOUS

## 2019-02-17 NOTE — Progress Notes (Signed)
Benign Gynecology Progress Note  Admission Date: 02/15/2019 Current Date: 02/17/2019  Tiffany Guzman is a 23 y.o. G1P1001 HD# 2.  History complicated by: Patient Active Problem List   Diagnosis Date Noted  . Nausea and vomiting in adult 02/15/2019  . Status post cesarean delivery 11/14/2017  . 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 Anxiety 06/24/2016   ROS and patient/family/surgical history, located on admission H&P note dated 02/15/2019, have been reviewed, and there are no changes except as noted below  Subjective:  Patient resting comfortably in bed. No new complaints.   Objective:   Vitals:   02/16/19 0846 02/16/19 1913 02/17/19 0425 02/17/19 0951  BP: (!) 144/87 134/86 (!) 135/96 (!) 147/106  Pulse: 79 64 70 86  Resp: 18 17 16 16   Temp: 98.7 F (37.1 C) 99.3 F (37.4 C) 98.3 F (36.8 C) 98.1 F (36.7 C)  TempSrc: Oral Oral Oral Tympanic  SpO2: 100% 100% 100% 100%  Weight:      Height:       Temp:  [98.1 F (36.7 C)-99.3 F (37.4 C)] 98.1 F (36.7 C) (07/29 0951) Pulse Rate:  [64-86] 86 (07/29 0951) Resp:  [16-17] 16 (07/29 0951) BP: (134-147)/(86-106) 147/106 (07/29 0951) SpO2:  [100 %] 100 % (07/29 0951) I/O last 3 completed shifts: In: 3498.3 [P.O.:240; I.V.:3258.3] Out: 0  Total I/O In: 399.9 [I.V.:399.9] Out: -   Intake/Output Summary (Last 24 hours) at 02/17/2019 1016 Last data filed at 02/17/2019 0800 Gross per 24 hour  Intake 2920.01 ml  Output -  Net 2920.01 ml     Current Vital Signs 24h Vital Sign Ranges  T 98.1 F (36.7 C) Temp  Avg: 98.6 F (37 C)  Min: 98.1 F (36.7 C)  Max: 99.3 F (37.4 C)  BP (!) 147/106 BP  Min: 134/86  Max: 147/106  HR 86 Pulse  Avg: 73.3  Min: 64  Max: 86  RR 16 Resp  Avg: 16.3  Min: 16  Max: 17  SaO2 100 % Room Air SpO2  Avg: 100 %  Min: 100 %  Max: 100 %           24 Hour I/O Current Shift I/O  Time Ins Outs 07/28 0701 - 07/29 0700 In: 2520.1 [P.O.:120; I.V.:2400.1] Out: -  07/29 0701 - 07/29  1900 In: 399.9 [I.V.:399.9] Out: -     Physical exam: General appearance: alert, cooperative and appears stated age Abdomen: soft, tender GU: No gross VB Lungs: clear to auscultation bilaterally Heart: regular rate and rhythm and no MRGs Extremities: no redness or tenderness in the calves or thighs, no edema Skin: no lesions Psych: appropriate  Recent Labs  Lab 02/15/19 0947 02/16/19 0529 02/17/19 0640  NA 139 140 139  K 3.5 3.7 4.0  CL 109 112* 108  CO2 18* 22 24  BUN 18 11 8   CREATININE 0.66 0.67 0.66  GLUCOSE 119* 89 87   Recent Labs  Lab 02/13/19 1848 02/15/19 0947 02/16/19 0529  WBC 16.3* 13.9* 9.8  HGB 11.8* 13.0 10.3*  HCT 35.9* 39.6 32.3*  PLT 258 357 253    Recent Labs  Lab 02/15/19 0947 02/16/19 0529 02/17/19 0640  CALCIUM 9.3 8.2* 8.6*   No results for input(s): INR, APTT in the last 168 hours.    Recent Labs  Lab 02/13/19 1341 02/15/19 0947  ALKPHOS 67 63  BILITOT 0.7 0.5  PROT 8.0 7.9  ALT 19 19  AST 29 24    Recent  Labs  Lab 02/13/19 1848 02/15/19 0947 02/16/19 0529  WBC 16.3* 13.9* 9.8  HGB 11.8* 13.0 10.3*  HCT 35.9* 39.6 32.3*  PLT 258 357 253   Recent Labs  Lab 02/13/19 1341 02/15/19 0947 02/16/19 0529 02/17/19 0640  NA 140 139 140 139  K 3.1* 3.5 3.7 4.0  CL 109 109 112* 108  CO2 16* 18* 22 24  BUN 11 18 11 8   CREATININE 0.79 0.66 0.67 0.66  CALCIUM 9.9 9.3 8.2* 8.6*  PROT 8.0 7.9  --   --   BILITOT 0.7 0.5  --   --   ALKPHOS 67 63  --   --   ALT 19 19  --   --   AST 29 24  --   --   GLUCOSE 133* 119* 89 87     Assessment & Plan:   Problem List Items Addressed This Visit    None    Visit Diagnoses    Generalized abdominal pain    -  Primary   Pelvic pain       Relevant Orders   US PELVIC DOPPLER (TORSION R/O OR MASS ARTERIAL FLOW) (Completed)   US PELVIS (TRANSABDOMINAL ONLY) (Completed)   Vomiting, intractability of vomiting not specified, presence of nausea not specified, unspecified vomiting type        Relevant Orders   US PELVIC DOPPLER (TORSION R/O OR MASS ARTERIAL FLOW) (Completed)   US PELVIS (TRANSABDOMINAL ONLY) (Completed)   History of ovarian cyst       Relevant Orders   US PELVIC DOPPLER (TORSION R/O OR MASS ARTERIAL FLOW) (Completed)   US PELVIS (TRANSABDOMINAL ONLY) (Completed)   Nausea and vomiting, intractability of vomiting not specified, unspecified vomiting type          Was able to schedule Tyneisha for surgery on 02/23/2019 for laparoscopic ovarian cystectomy. Discussed risks, benefits and alternatives with patient. She is aware she will need Covid testing on Friday. My office will contact her about pre-operative scheduling.   Adrian Prows MD [facility] 02/17/2019 10:19 AM

## 2019-02-17 NOTE — Op Note (Signed)
[facility] Gastroenterology Patient Name: Tiffany Guzman Procedure Date: 02/17/2019 11:25 AM MRN: [id number] Account #: [id number] Date of Birth: 05/24/1996 Admit Type: Outpatient Age: 23 Room: [facility] ENDO ROOM 2 Gender: Female Note Status: Finalized Procedure:            Upper GI endoscopy Indications:          Abdominal pain, Nausea with vomiting Providers:            Tomasa Dobransky B. Bonna Gains MD, MD Medicines:            Monitored Anesthesia Care Complications:        No immediate complications. Procedure:            Pre-Anesthesia Assessment:                       - Prior to the procedure, a History and Physical was                        performed, and patient medications, allergies and                        sensitivities were reviewed. The patient's tolerance of                        previous anesthesia was reviewed.                       - The risks and benefits of the procedure and the                        sedation options and risks were discussed with the                        patient. All questions were answered and informed                        consent was obtained.                       - Patient identification and proposed procedure were                        verified prior to the procedure by the physician, the                        nurse, the anesthesiologist, the anesthetist and the                        technician. The procedure was verified in the procedure                        room.                       - ASA Grade Assessment: II - A patient with mild                        systemic disease.                       After obtaining informed consent, the endoscope was  passed under direct vision. Throughout the procedure,                        the patient's blood pressure, pulse, and oxygen                        saturations were monitored continuously. The Endoscope                        was introduced through the  mouth, and advanced to the                        third part of duodenum. The upper GI endoscopy was                        accomplished with ease. The patient tolerated the                        procedure well. Findings:      The examined esophagus was normal.      The entire examined stomach was normal. Biopsies were obtained in the       gastric body, at the incisura and in the gastric antrum with cold       forceps for histology. Biopsies were taken with a cold forceps for       Helicobacter pylori testing.      The duodenal bulb, second portion of the duodenum, third portion of the       duodenum and examined duodenum were normal. Impression:           - Normal esophagus.                       - Normal stomach. Biopsied.                       - Normal duodenal bulb, second portion of the duodenum,                        third portion of the duodenum and examined duodenum.                       - Biopsies were obtained in the gastric body, at the                        incisura and in the gastric antrum. Recommendation:       - Await pathology results.                       - Advance diet as tolerated.                       - Continue present medications.                       - Patient has a contact number available for                        emergencies. The signs and symptoms of potential                        delayed complications were discussed  with the patient.                        Return to normal activities tomorrow. Written discharge                        instructions were provided to the patient.                       - Discharge patient to home (with escort).                       - The findings and recommendations were discussed with                        the patient.                       - Abstain from marijuana use Procedure Code(s):    --- Professional ---                       [phone], Esophagogastroduodenoscopy, flexible, transoral;                         with biopsy, single or multiple Diagnosis Code(s):    --- Professional ---                       R10.9, Unspecified abdominal pain                       R11.2, Nausea with vomiting, unspecified CPT copyright 2019 American Medical Association. All rights reserved. The codes documented in this report are preliminary and upon coder review may  be revised to meet current compliance requirements.  Vonda Antigua, MD Margretta Sidle B. Bonna Gains MD, MD 02/17/2019 11:52:56 AM This report has been signed electronically. Number of Addenda: 0 Note Initiated On: 02/17/2019 11:25 AM Estimated Blood Loss: Estimated blood loss: none.      [facility]

## 2019-02-17 NOTE — Anesthesia Preprocedure Evaluation (Addendum)
Anesthesia Evaluation  Patient identified by MRN, date of birth, ID band Patient awake    Reviewed: Allergy & Precautions, H&P , NPO status , Patient's Chart, lab work & pertinent test results  History of Anesthesia Complications Negative for: history of anesthetic complications  Airway Mallampati: I  TM Distance: >3 FB Neck ROM: full    Dental  (+) Chipped, Dental Advidsory Given   Pulmonary neg pulmonary ROS, neg shortness of breath,           Cardiovascular Exercise Tolerance: Good negative cardio ROS       Neuro/Psych  Headaches, neg Seizures PSYCHIATRIC DISORDERS Anxiety Depression    GI/Hepatic Neg liver ROS, GERD  Medicated and Controlled,  Endo/Other  negative endocrine ROS  Renal/GU negative Renal ROS  negative genitourinary   Musculoskeletal   Abdominal   Peds  Hematology negative hematology ROS (+)   Anesthesia Other Findings Past Medical History: No date: Anxiety No date: Attention deficit hyperactivity disorder (ADHD) No date: Chronic upper back pain No date: Depression No date: GERD (gastroesophageal reflux disease) No date: Migraine with aura     Comment:  since elementary school  Past Surgical History: No date: INSERTION OF NON VAGINAL CONTRACEPTIVE DEVICE 10/03/2017: OOPHORECTOMY; Left     Comment:  Procedure: OOPHORECTOMY;  Surgeon: Homero Fellers, MD;  Location: [facility] ORS;  Service: Gynecology;                Laterality: Left; 10/03/2017: OVARIAN CYST REMOVAL; Left     Comment:  Procedure: OVARIAN CYSTECTOMY;  Surgeon: Homero Fellers, MD;  Location: [facility] ORS;  Service:               Gynecology;  Laterality: Left;  BMI    Body Mass Index:  22.96 kg/m      Reproductive/Obstetrics negative OB ROS                            Anesthesia Physical  Anesthesia Plan  ASA: II  Anesthesia Plan: General   Post-op Pain  Management:    Induction: Intravenous  PONV Risk Score and Plan: 3 and Propofol infusion and TIVA  Airway Management Planned: Natural Airway and Nasal Cannula  Additional Equipment:   Intra-op Plan:   Post-operative Plan:   Informed Consent: I have reviewed the patients History and Physical, chart, labs and discussed the procedure including the risks, benefits and alternatives for the proposed anesthesia with the patient or authorized representative who has indicated his/her understanding and acceptance.     Dental Advisory Given  Plan Discussed with: Anesthesiologist, CRNA and Surgeon  Anesthesia Plan Comments:         Anesthesia Quick Evaluation

## 2019-02-17 NOTE — Progress Notes (Signed)
Vonda Antigua, MD [address], [place], [place], [place], [address] [address], [place], [place], [place], [address] Phone: [phone]  Fax: [phone]   Subjective: Nausea vomiting improving.  No abdominal pain at this time   Objective: Exam: Vital signs in last 24 hours: Vitals:   02/16/19 0356 02/16/19 0846 02/16/19 1913 02/17/19 0425  BP: 130/83 (!) 144/87 134/86 (!) 135/96  Pulse: 72 79 64 70  Resp: 18 18 17 16   Temp: 98 F (36.7 C) 98.7 F (37.1 C) 99.3 F (37.4 C) 98.3 F (36.8 C)  TempSrc: Oral Oral Oral Oral  SpO2: 100% 100% 100% 100%  Weight:      Height:       Weight change:   Intake/Output Summary (Last 24 hours) at 02/17/2019 0941 Last data filed at 02/17/2019 0800 Gross per 24 hour  Intake 2920.01 ml  Output --  Net 2920.01 ml    General: No acute distress, AAO x3 Abd: Soft, NT/ND, No HSM Skin: Warm, no rashes Neck: Supple, Trachea midline   Lab Results: Lab Results  Component Value Date   WBC 9.8 02/16/2019   HGB 10.3 (L) 02/16/2019   HCT 32.3 (L) 02/16/2019   MCV 86.4 02/16/2019   PLT 253 02/16/2019   Micro Results: Recent Results (from the past 240 hour(s))  SARS Coronavirus 2 (CEPHEID - Performed in [facility] hospital lab), Hosp Order     Status: None   Collection Time: 02/15/19  4:56 PM   Specimen: Nasopharyngeal  Result Value Ref Range Status   SARS Coronavirus 2 NEGATIVE NEGATIVE Final    Comment: (NOTE) If result is NEGATIVE SARS-CoV-2 target nucleic acids are NOT DETECTED. The SARS-CoV-2 RNA is generally detectable in upper and lower  respiratory specimens during the acute phase of infection. The lowest  concentration of SARS-CoV-2 viral copies this assay can detect is 250  copies / mL. A negative result does not preclude SARS-CoV-2 infection  and should not be used as the sole basis for treatment or other  patient management decisions.  A negative result may occur with  improper specimen collection /  handling, submission of specimen other  than nasopharyngeal swab, presence of viral mutation(s) within the  areas targeted by this assay, and inadequate number of viral copies  (<250 copies / mL). A negative result must be combined with clinical  observations, patient history, and epidemiological information. If result is POSITIVE SARS-CoV-2 target nucleic acids are DETECTED. The SARS-CoV-2 RNA is generally detectable in upper and lower  respiratory specimens dur ing the acute phase of infection.  Positive  results are indicative of active infection with SARS-CoV-2.  Clinical  correlation with patient history and other diagnostic information is  necessary to determine patient infection status.  Positive results do  not rule out bacterial infection or co-infection with other viruses. If result is PRESUMPTIVE POSTIVE SARS-CoV-2 nucleic acids MAY BE PRESENT.   A presumptive positive result was obtained on the submitted specimen  and confirmed on repeat testing.  While 2019 novel coronavirus  (SARS-CoV-2) nucleic acids may be present in the submitted sample  additional confirmatory testing may be necessary for epidemiological  and / or clinical management purposes  to differentiate between  SARS-CoV-2 and other Sarbecovirus currently known to infect humans.  If clinically indicated additional testing with an alternate test  methodology [phone] is advised. The SARS-CoV-2 RNA is generally  detectable in upper and lower respiratory sp ecimens during the acute  phase of infection. The expected  result is Negative. Fact Sheet for Patients:  StrictlyIdeas.no Fact Sheet for Healthcare Providers: BankingDealers.co.za This test is not yet approved or cleared by the [place] FDA and has been authorized for detection and/or diagnosis of SARS-CoV-2 by FDA under an Emergency Use Authorization (EUA).  This EUA will remain in effect (meaning this  test can be used) for the duration of the COVID-19 declaration under Section 564(b)(1) of the Act, 21 U.S.C. section 360bbb-3(b)(1), unless the authorization is terminated or revoked sooner. Performed at [facility], [address] [place], [place] [address]   MRSA PCR Screening     Status: None   Collection Time: 02/15/19  6:28 PM   Specimen: Nasopharyngeal  Result Value Ref Range Status   MRSA by PCR NEGATIVE NEGATIVE Final    Comment:        The GeneXpert MRSA Assay (FDA approved for NASAL specimens only), is one component of a comprehensive MRSA colonization surveillance program. It is not intended to diagnose MRSA infection nor to guide or monitor treatment for MRSA infections. Performed at [facility], [address] [place], [place] [address]    Studies/Results: US Pelvis (transabdominal Only)  Result Date: 02/15/2019 CLINICAL DATA:  Pelvic pain and vomiting. History of an ovarian dermoid. History of left oophorectomy 10/03/2017. EXAM: TRANSABDOMINAL ULTRASOUND OF PELVIS DOPPLER ULTRASOUND OF OVARIES TECHNIQUE: Transabdominal ultrasound examination of the pelvis was performed including evaluation of the uterus, ovaries, adnexal regions, and pelvic cul-de-sac. Color and duplex Doppler ultrasound was utilized to evaluate blood flow to the ovaries. COMPARISON:  Pelvic ultrasound 02/02/2019. FINDINGS: Uterus Measurements: 8.5 x 4.9 x 6.7 cm = volume: 115.3 mL. No fibroids or other mass visualized. Endometrium Thickness: 0.6.  No focal abnormality visualized. Right ovary Measurements: 5.0 x 3.6 x 2.7 cm = volume: 25.3 mL. 2.1 x 2.2 x 2.3 cm echogenic lesion consistent with the patient's known dermoid is identified. Left ovary Removed. Pulsed Doppler evaluation demonstrates normal low-resistance arterial and venous waveforms in right ovary. Other: None. IMPRESSION: No acute abnormality. Right ovarian dermoid is seen on prior exam. Status post left oophorectomy.  Electronically Signed   By: Inge Rise M.D.   On: 02/15/2019 13:23   US Pelvic Doppler (torsion R/o Or Mass Arterial Flow)  Result Date: 02/15/2019 CLINICAL DATA:  Pelvic pain and vomiting. History of an ovarian dermoid. History of left oophorectomy 10/03/2017. EXAM: TRANSABDOMINAL ULTRASOUND OF PELVIS DOPPLER ULTRASOUND OF OVARIES TECHNIQUE: Transabdominal ultrasound examination of the pelvis was performed including evaluation of the uterus, ovaries, adnexal regions, and pelvic cul-de-sac. Color and duplex Doppler ultrasound was utilized to evaluate blood flow to the ovaries. COMPARISON:  Pelvic ultrasound 02/02/2019. FINDINGS: Uterus Measurements: 8.5 x 4.9 x 6.7 cm = volume: 115.3 mL. No fibroids or other mass visualized. Endometrium Thickness: 0.6.  No focal abnormality visualized. Right ovary Measurements: 5.0 x 3.6 x 2.7 cm = volume: 25.3 mL. 2.1 x 2.2 x 2.3 cm echogenic lesion consistent with the patient's known dermoid is identified. Left ovary Removed. Pulsed Doppler evaluation demonstrates normal low-resistance arterial and venous waveforms in right ovary. Other: None. IMPRESSION: No acute abnormality. Right ovarian dermoid is seen on prior exam. Status post left oophorectomy. Electronically Signed   By: Inge Rise M.D.   On: 02/15/2019 13:23   Dg Abdomen Acute W/chest  Result Date: 02/15/2019 CLINICAL DATA:  Nausea, vomiting and diarrhea this morning. History of ovarian cystectomy and oophorectomy. EXAM: DG ABDOMEN ACUTE W/ 1V CHEST COMPARISON:  Abdominal CT 01/29/2019. FINDINGS: The heart  size and mediastinal contours are normal. The lungs are clear. There is no pleural effusion or pneumothorax. No acute osseous findings are identified. The bowel gas pattern is normal. There is no free intraperitoneal air or suspicious calcification. Small pelvic phleboliths are stable. The bones appear unremarkable. IMPRESSION: No evidence of active cardiopulmonary or abdominal process.  Electronically Signed   By: Richardean Sale M.D.   On: 02/15/2019 16:33   Medications:  Scheduled Meds:  dicyclomine  10 mg Oral TID AC   enoxaparin (LOVENOX) injection  40 mg Subcutaneous Q24H   feeding supplement  1 Container Oral TID BM   multivitamin with minerals  1 tablet Oral Daily   pantoprazole (PROTONIX) IV  40 mg Intravenous Q24H   Continuous Infusions:  0.9 % NaCl with KCl 20 mEq / L 100 mL/hr at 02/17/19 0920   PRN Meds:.acetaminophen **OR** acetaminophen, morphine injection, promethazine, senna-docusate   Assessment: Active Problems:   Nausea and vomiting in adult    Plan: Discussed marijuana cessation again.  Patient states she has been thinking about this and will discuss this with her significant other as well  GYN evaluated the patient and is planning on laparoscopy with cystectomy for dermoid cyst in the future  We will proceed with EGD today for further evaluation of symptoms and ruling out any obstructive or large lesions   LOS: 1 day   Vonda Antigua, MD 02/17/2019, 9:41 AM

## 2019-02-17 NOTE — Anesthesia Postprocedure Evaluation (Signed)
Anesthesia Post Note  Patient: Tiffany Guzman  Procedure(s) Performed: ESOPHAGOGASTRODUODENOSCOPY (EGD) (Left )  Patient location during evaluation: Endoscopy Anesthesia Type: General Level of consciousness: awake and alert Pain management: pain level controlled Vital Signs Assessment: post-procedure vital signs reviewed and stable Respiratory status: spontaneous breathing, nonlabored ventilation, respiratory function stable and patient connected to nasal cannula oxygen Cardiovascular status: blood pressure returned to baseline and stable Postop Assessment: no apparent nausea or vomiting Anesthetic complications: no     Last Vitals:  Vitals:   02/17/19 1155 02/17/19 1205  BP: 118/69 133/86  Pulse: 67   Resp: 14   Temp: 36.7 C   SpO2: 95%     Last Pain:  Vitals:   02/17/19 1205  TempSrc:   PainSc: 0-No pain                 Martha Clan

## 2019-02-17 NOTE — Anesthesia Post-op Follow-up Note (Signed)
Anesthesia QCDR form completed.        

## 2019-02-17 NOTE — Transfer of Care (Signed)
Immediate Anesthesia Transfer of Care Note  Patient: Tiffany Guzman  Procedure(s) Performed: ESOPHAGOGASTRODUODENOSCOPY (EGD) (Left )  Patient Location: PACU and Endoscopy Unit  Anesthesia Type:General  Level of Consciousness: drowsy  Airway & Oxygen Therapy: Patient Spontanous Breathing  Post-op Assessment: Report given to RN and Post -op Vital signs reviewed and stable  Post vital signs: Reviewed and stable  Last Vitals: see vital sign flow sheet Vitals Value Taken Time  BP    Temp    Pulse    Resp    SpO2      Last Pain:  Vitals:   02/17/19 0951  TempSrc: Tympanic  PainSc:       Patients Stated Pain Goal: 0 (83/67/25 5001)  Complications: No apparent anesthesia complications

## 2019-02-17 NOTE — Telephone Encounter (Signed)
-----   Message from Homero Fellers, MD sent at 02/17/2019 10:19 AM EDT ----- Regarding: Surgery I put this on the OR schedule this morning. Patient is aware. Discussed with pre-op and patient that she will need covid testing Friday [facility]. Can you plan her preoperative time. I will obtain consents in pre-op. She will not need an office visit before hand.  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

## 2019-02-17 NOTE — Consult Note (Signed)
Reason for Consult:Dermoid Cyst Referring Physician: Tressia Miners MD  [facility] Lon Mauriello is an 23 y.o. female.  HPI: She is admitted for nausea, vomiting, and abdominal pain. She is feeling better today. She reports that she has not vomited today which is a significant improvement. She has been having vague abdominal pain and she is concerned that it may be related to her dermoid cyst. She understands that the cyst is relatively small, that it has adequate blood flow and does not appear to be torsed. She understands it as a source of her nausea and vomiting is not likely. 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 She would like to find a way to "make this stop happening."  Discussed with Mylia her current diagnosis of nausea related to marijuana usage. She reports to me that she has bee a heavy daily user of marijuana since that age of 31. Since 14 she says "there has never been a period of time or day where I did not smoke. I smoked throughout my pregnancy and I used to heavily drink alcohol." She reports that it seems like now she has a lot of food sensitivities and needs to be selective about what she eats that will not upset her stomach. Greasy foods or acidic foods seem to upset it the most. She reports that she has some issues with constipation and diarrhea, but she also endorses daily bowel movements.   Past Medical History:  Diagnosis Date  . Anxiety   . Attention deficit hyperactivity disorder (ADHD)   . Chronic upper back pain   . Depression   . GERD (gastroesophageal reflux disease)   . Gestational hypertension 11/14/2017  . Migraine with aura    since elementary school    Past Surgical History:  Procedure Laterality Date  . CESAREAN SECTION N/A 11/14/2017   Procedure: CESAREAN SECTION;  Surgeon: Will Bonnet, MD;  Location: [facility] ORS;  Service: Obstetrics;  Laterality:  N/A;  . INSERTION OF NON VAGINAL CONTRACEPTIVE DEVICE    . OOPHORECTOMY Left 10/03/2017   Procedure: OOPHORECTOMY;  Surgeon: Homero Fellers, MD;  Location: [facility] ORS;  Service: Gynecology;  Laterality: Left;  . OVARIAN CYST REMOVAL Left 10/03/2017   Procedure: OVARIAN CYSTECTOMY;  Surgeon: Homero Fellers, MD;  Location: [facility] ORS;  Service: Gynecology;  Laterality: Left;    Family History  Problem Relation Age of Onset  . Hypertension Mother   . Hypothyroidism Mother   . Depression Mother   . Diabetes Maternal Grandmother     Social History:  reports that she has never smoked. She has never used smokeless tobacco. She reports current drug use. Frequency: 7.00 times per week. Drug: Marijuana. She reports that she does not drink alcohol.  Allergies: No Known Allergies  Medications: I have reviewed the patient's current medications.  Results for orders placed or performed during the hospital encounter of 02/15/19 (from the past 48 hour(s))  Urinalysis, Complete w Microscopic     Status: Abnormal   Collection Time: 02/15/19  9:40 AM  Result Value Ref Range   Color, Urine YELLOW (A) YELLOW   APPearance CLEAR (A) CLEAR   Specific Gravity, Urine 1.023 1.005 - 1.030   pH 8.0 5.0 - 8.0   Glucose, UA NEGATIVE NEGATIVE mg/dL   Hgb urine dipstick NEGATIVE NEGATIVE   Bilirubin Urine NEGATIVE NEGATIVE   Ketones, ur 20 (A) NEGATIVE mg/dL  Protein, ur 30 (A) NEGATIVE mg/dL   Nitrite NEGATIVE NEGATIVE   Leukocytes,Ua NEGATIVE NEGATIVE   RBC / HPF 0-5 0 - 5 RBC/hpf   WBC, UA 0-5 0 - 5 WBC/hpf   Bacteria, UA NONE SEEN NONE SEEN   Squamous Epithelial / LPF 0-5 0 - 5   Mucus PRESENT     Comment: Performed at [facility], [address] [place], [place] [address]  Lipase, blood     Status: None   Collection Time: 02/15/19  9:47 AM  Result Value Ref Range   Lipase 32 11 - 51 U/L    Comment: Performed at [facility], [address] [place], [place] [address]   Comprehensive metabolic panel     Status: Abnormal   Collection Time: 02/15/19  9:47 AM  Result Value Ref Range   Sodium 139 135 - 145 mmol/L   Potassium 3.5 3.5 - 5.1 mmol/L   Chloride 109 98 - 111 mmol/L   CO2 18 (L) 22 - 32 mmol/L   Glucose, Bld 119 (H) 70 - 99 mg/dL   BUN 18 6 - 20 mg/dL   Creatinine, Ser 0.66 0.44 - 1.00 mg/dL   Calcium 9.3 8.9 - 10.3 mg/dL   Total Protein 7.9 6.5 - 8.1 g/dL   Albumin 4.8 3.5 - 5.0 g/dL   AST 24 15 - 41 U/L   ALT 19 0 - 44 U/L   Alkaline Phosphatase 63 38 - 126 U/L   Total Bilirubin 0.5 0.3 - 1.2 mg/dL   GFR calc non Af Amer >60 >60 mL/min   GFR calc Af Amer >60 >60 mL/min   Anion gap 12 5 - 15    Comment: Performed at [facility], [address] [place], [place] [address]  CBC     Status: Abnormal   Collection Time: 02/15/19  9:47 AM  Result Value Ref Range   WBC 13.9 (H) 4.0 - 10.5 K/uL   RBC 4.71 3.87 - 5.11 MIL/uL   Hemoglobin 13.0 12.0 - 15.0 g/dL   HCT 39.6 36.0 - 46.0 %   MCV 84.1 80.0 - 100.0 fL   MCH 27.6 26.0 - 34.0 pg   MCHC 32.8 30.0 - 36.0 g/dL   RDW 13.2 11.5 - 15.5 %   Platelets 357 150 - 400 K/uL   nRBC 0.0 0.0 - 0.2 %    Comment: Performed at [facility], [address] [place], [place] [address]  Urine Drug Screen, Qualitative     Status: Abnormal   Collection Time: 02/15/19 10:36 AM  Result Value Ref Range   Tricyclic, Ur Screen NONE DETECTED NONE DETECTED   Amphetamines, Ur Screen NONE DETECTED NONE DETECTED   MDMA (Ecstasy)Ur Screen NONE DETECTED NONE DETECTED   Cocaine Metabolite,Ur [place] NONE DETECTED NONE DETECTED   Opiate, Ur Screen POSITIVE (A) NONE DETECTED   Phencyclidine (PCP) Ur S NONE DETECTED NONE DETECTED   Cannabinoid 50 Ng, Ur [place] POSITIVE (A) NONE DETECTED   Barbiturates, Ur Screen NONE DETECTED NONE DETECTED   Benzodiazepine, Ur Scrn NONE DETECTED NONE DETECTED   Methadone Scn, Ur NONE DETECTED NONE DETECTED    Comment: (NOTE) Tricyclics + metabolites, urine    Cutoff  1000 ng/mL Amphetamines + metabolites, urine  Cutoff 1000 ng/mL MDMA (Ecstasy), urine              Cutoff 500 ng/mL Cocaine Metabolite, urine          Cutoff 300 ng/mL Opiate +  metabolites, urine        Cutoff 300 ng/mL Phencyclidine (PCP), urine         Cutoff 25 ng/mL Cannabinoid, urine                 Cutoff 50 ng/mL Barbiturates + metabolites, urine  Cutoff 200 ng/mL Benzodiazepine, urine              Cutoff 200 ng/mL Methadone, urine                   Cutoff 300 ng/mL The urine drug screen provides only a preliminary, unconfirmed analytical test result and should not be used for non-medical purposes. Clinical consideration and professional judgment should be applied to any positive drug screen result due to possible interfering substances. A more specific alternate chemical method must be used in order to obtain a confirmed analytical result. Gas chromatography / mass spectrometry (GC/MS) is the preferred confirmat ory method. Performed at [facility], [address] [place], [place] [address]   Pregnancy, urine     Status: None   Collection Time: 02/15/19 11:38 AM  Result Value Ref Range   Preg Test, Ur NEGATIVE NEGATIVE    Comment: Performed at [facility], [address], [place], [place] [address]  SARS Coronavirus 2 (CEPHEID - Performed in [facility] hospital lab), Hosp Order     Status: None   Collection Time: 02/15/19  4:56 PM   Specimen: Nasopharyngeal  Result Value Ref Range   SARS Coronavirus 2 NEGATIVE NEGATIVE    Comment: (NOTE) If result is NEGATIVE SARS-CoV-2 target nucleic acids are NOT DETECTED. The SARS-CoV-2 RNA is generally detectable in upper and lower  respiratory specimens during the acute phase of infection. The lowest  concentration of SARS-CoV-2 viral copies this assay can detect is 250  copies / mL. A negative result does not preclude SARS-CoV-2 infection  and should not be used as the sole basis for treatment or other   patient management decisions.  A negative result may occur with  improper specimen collection / handling, submission of specimen other  than nasopharyngeal swab, presence of viral mutation(s) within the  areas targeted by this assay, and inadequate number of viral copies  (<250 copies / mL). A negative result must be combined with clinical  observations, patient history, and epidemiological information. If result is POSITIVE SARS-CoV-2 target nucleic acids are DETECTED. The SARS-CoV-2 RNA is generally detectable in upper and lower  respiratory specimens dur ing the acute phase of infection.  Positive  results are indicative of active infection with SARS-CoV-2.  Clinical  correlation with patient history and other diagnostic information is  necessary to determine patient infection status.  Positive results do  not rule out bacterial infection or co-infection with other viruses. If result is PRESUMPTIVE POSTIVE SARS-CoV-2 nucleic acids MAY BE PRESENT.   A presumptive positive result was obtained on the submitted specimen  and confirmed on repeat testing.  While 2019 novel coronavirus  (SARS-CoV-2) nucleic acids may be present in the submitted sample  additional confirmatory testing may be necessary for epidemiological  and / or clinical management purposes  to differentiate between  SARS-CoV-2 and other Sarbecovirus currently known to infect humans.  If clinically indicated additional testing with an alternate test  methodology [phone]) is advised. The SARS-CoV-2 RNA is generally  detectable in upper and lower respiratory sp ecimens during the acute  phase of infection. The expected result is Negative. Fact Sheet for Patients:  StrictlyIdeas.no Fact Sheet for Healthcare Providers: BankingDealers.co.za This test is not yet approved or cleared by the [place] FDA and has been authorized for detection and/or diagnosis of SARS-CoV-2  by FDA under an Emergency Use Authorization (EUA).  This EUA will remain in effect (meaning this test can be used) for the duration of the COVID-19 declaration under Section 564(b)(1) of the Act, 21 U.S.C. section 360bbb-3(b)(1), unless the authorization is terminated or revoked sooner. Performed at [facility], [address] [place], [place] [address]   MRSA PCR Screening     Status: None   Collection Time: 02/15/19  6:28 PM   Specimen: Nasopharyngeal  Result Value Ref Range   MRSA by PCR NEGATIVE NEGATIVE    Comment:        The GeneXpert MRSA Assay (FDA approved for NASAL specimens only), is one component of a comprehensive MRSA colonization surveillance program. It is not intended to diagnose MRSA infection nor to guide or monitor treatment for MRSA infections. Performed at [facility], [address] [place], [place] [address]   Basic metabolic panel     Status: Abnormal   Collection Time: 02/16/19  5:29 AM  Result Value Ref Range   Sodium 140 135 - 145 mmol/L   Potassium 3.7 3.5 - 5.1 mmol/L   Chloride 112 (H) 98 - 111 mmol/L   CO2 22 22 - 32 mmol/L   Glucose, Bld 89 70 - 99 mg/dL   BUN 11 6 - 20 mg/dL   Creatinine, Ser 0.67 0.44 - 1.00 mg/dL   Calcium 8.2 (L) 8.9 - 10.3 mg/dL   GFR calc non Af Amer >60 >60 mL/min   GFR calc Af Amer >60 >60 mL/min   Anion gap 6 5 - 15    Comment: Performed at [facility], [address] [place], [place] [address]  CBC     Status: Abnormal   Collection Time: 02/16/19  5:29 AM  Result Value Ref Range   WBC 9.8 4.0 - 10.5 K/uL   RBC 3.74 (L) 3.87 - 5.11 MIL/uL   Hemoglobin 10.3 (L) 12.0 - 15.0 g/dL   HCT 32.3 (L) 36.0 - 46.0 %   MCV 86.4 80.0 - 100.0 fL   MCH 27.5 26.0 - 34.0 pg   MCHC 31.9 30.0 - 36.0 g/dL   RDW 13.3 11.5 - 15.5 %   Platelets 253 150 - 400 K/uL   nRBC 0.0 0.0 - 0.2 %    Comment: Performed at [facility], [address], [place], [place] [address]    US Pelvis  (transabdominal Only)  Result Date: 02/15/2019 CLINICAL DATA:  Pelvic pain and vomiting. History of an ovarian dermoid. History of left oophorectomy 10/03/2017. EXAM: TRANSABDOMINAL ULTRASOUND OF PELVIS DOPPLER ULTRASOUND OF OVARIES TECHNIQUE: Transabdominal ultrasound examination of the pelvis was performed including evaluation of the uterus, ovaries, adnexal regions, and pelvic cul-de-sac. Color and duplex Doppler ultrasound was utilized to evaluate blood flow to the ovaries. COMPARISON:  Pelvic ultrasound 02/02/2019. FINDINGS: Uterus Measurements: 8.5 x 4.9 x 6.7 cm = volume: 115.3 mL. No fibroids or other mass visualized. Endometrium Thickness: 0.6.  No focal abnormality visualized. Right ovary Measurements: 5.0 x 3.6 x 2.7 cm = volume: 25.3 mL. 2.1 x 2.2 x 2.3 cm echogenic lesion consistent with the patient's known dermoid is identified. Left ovary Removed. Pulsed Doppler evaluation demonstrates normal low-resistance arterial and venous waveforms in right ovary. Other: None. IMPRESSION: No acute abnormality. Right ovarian dermoid is seen on  prior exam. Status post left oophorectomy. Electronically Signed   By: Inge Rise M.D.   On: 02/15/2019 13:23   US Pelvic Doppler (torsion R/o Or Mass Arterial Flow)  Result Date: 02/15/2019 CLINICAL DATA:  Pelvic pain and vomiting. History of an ovarian dermoid. History of left oophorectomy 10/03/2017. EXAM: TRANSABDOMINAL ULTRASOUND OF PELVIS DOPPLER ULTRASOUND OF OVARIES TECHNIQUE: Transabdominal ultrasound examination of the pelvis was performed including evaluation of the uterus, ovaries, adnexal regions, and pelvic cul-de-sac. Color and duplex Doppler ultrasound was utilized to evaluate blood flow to the ovaries. COMPARISON:  Pelvic ultrasound 02/02/2019. FINDINGS: Uterus Measurements: 8.5 x 4.9 x 6.7 cm = volume: 115.3 mL. No fibroids or other mass visualized. Endometrium Thickness: 0.6.  No focal abnormality visualized. Right ovary Measurements: 5.0 x 3.6  x 2.7 cm = volume: 25.3 mL. 2.1 x 2.2 x 2.3 cm echogenic lesion consistent with the patient's known dermoid is identified. Left ovary Removed. Pulsed Doppler evaluation demonstrates normal low-resistance arterial and venous waveforms in right ovary. Other: None. IMPRESSION: No acute abnormality. Right ovarian dermoid is seen on prior exam. Status post left oophorectomy. Electronically Signed   By: Inge Rise M.D.   On: 02/15/2019 13:23   Dg Abdomen Acute W/chest  Result Date: 02/15/2019 CLINICAL DATA:  Nausea, vomiting and diarrhea this morning. History of ovarian cystectomy and oophorectomy. EXAM: DG ABDOMEN ACUTE W/ 1V CHEST COMPARISON:  Abdominal CT 01/29/2019. FINDINGS: The heart size and mediastinal contours are normal. The lungs are clear. There is no pleural effusion or pneumothorax. No acute osseous findings are identified. The bowel gas pattern is normal. There is no free intraperitoneal air or suspicious calcification. Small pelvic phleboliths are stable. The bones appear unremarkable. IMPRESSION: No evidence of active cardiopulmonary or abdominal process. Electronically Signed   By: Richardean Sale M.D.   On: 02/15/2019 16:33    Review of Systems  Constitutional: Negative for chills, fever, malaise/fatigue and weight loss.  HENT: Negative for congestion, hearing loss and sinus pain.   Eyes: Negative for blurred vision and double vision.  Respiratory: Negative for cough, sputum production, shortness of breath and wheezing.   Cardiovascular: Negative for chest pain, palpitations, orthopnea and leg swelling.  Gastrointestinal: Negative for abdominal pain, constipation, diarrhea, nausea and vomiting.  Genitourinary: Negative for dysuria, flank pain, frequency, hematuria and urgency.  Musculoskeletal: Negative for back pain, falls and joint pain.  Skin: Negative for itching and rash.  Neurological: Negative for dizziness and headaches.  Psychiatric/Behavioral: Negative for depression,  substance abuse and suicidal ideas. The patient is not nervous/anxious.    Blood pressure 134/86, pulse 64, temperature 99.3 F (37.4 C), temperature source Oral, resp. rate 17, height 5\' 4"  (1.626 m), weight 59 kg, last menstrual period 02/13/2019, SpO2 100 %, not currently breastfeeding. Physical Exam  Nursing note and vitals reviewed. Constitutional: She is oriented to person, place, and time. She appears well-developed and well-nourished.  HENT:  Head: Normocephalic and atraumatic.  Eyes: Pupils are equal, round, and reactive to light.  Cardiovascular: Normal rate and regular rhythm.  Respiratory: Effort normal and breath sounds normal.  GI: Soft. Bowel sounds are normal. She exhibits no distension and no mass. There is abdominal tenderness. There is no rebound and no guarding.  Musculoskeletal: Normal range of motion.  Neurological: She is alert and oriented to person, place, and time.  Skin: Skin is warm and dry.  Psychiatric: She has a normal mood and affect. Her behavior is normal. Judgment and thought content normal.    Assessment/Plan:  23 yo G1P1001 1. Dermoid cyst- will arrange a time when she can have a laparoscopy a cystectomy for this dermoid cyst in the near future. Will discuss with my partners and work towards a surgical time in the next 1-2 weeks. [place] is aware that a cystectomy will not prevent another dermoid from forming in the future. 2. Nausea and vomiting is improved- continue current care. EGD planned for tomorrow. 3. Discussed mariajuana cessation with Aizza who reports that she has been considering this and speaking with her significant other about this. Suggested a trial of 6-12 months to see if she is able to have improvement with her symptoms.   Thank you for this consultation. Please call with any additional questions.    R  02/17/2019, 12:06 AM

## 2019-02-17 NOTE — Telephone Encounter (Signed)
Patient has not been discharged yet, spoke to patient's nurse, Misty with phone interview and covid testing instructions, as I will not be here the rest of the week.

## 2019-02-17 NOTE — Progress Notes (Signed)
[facility] [place] NAME: Tiffany Guzman    MR#:  [id number]  DATE OF BIRTH:  1996-03-30  SUBJECTIVE:  CHIEF COMPLAINT:   Chief Complaint  Patient presents with  . Abdominal Pain   -Improved nausea and vomiting this morning.  For EGD today  REVIEW OF SYSTEMS:  Review of Systems  Constitutional: Negative for chills, fever and malaise/fatigue.  HENT: Negative for congestion, ear discharge, hearing loss and nosebleeds.   Eyes: Negative for blurred vision and double vision.  Respiratory: Negative for cough, shortness of breath and wheezing.   Cardiovascular: Negative for chest pain and palpitations.  Gastrointestinal: Positive for abdominal pain, nausea and vomiting. Negative for constipation and diarrhea.  Genitourinary: Negative for dysuria.  Musculoskeletal: Negative for myalgias.  Neurological: Negative for dizziness, focal weakness, seizures, weakness and headaches.  Psychiatric/Behavioral: Negative for depression.    DRUG ALLERGIES:  No Known Allergies  VITALS:  Blood pressure (!) 147/106, pulse 86, temperature 98.1 F (36.7 C), temperature source Tympanic, resp. rate 16, height 5\' 4"  (1.626 m), weight 59 kg, last menstrual period 02/13/2019, SpO2 100 %, not currently breastfeeding.  PHYSICAL EXAMINATION:  Physical Exam   GENERAL:  23 y.o.-year-old patient lying in the bed with no acute distress.  EYES: Pupils equal, round, reactive to light and accommodation. No scleral icterus. Extraocular muscles intact.  HEENT: Head atraumatic, normocephalic. Oropharynx and nasopharynx clear.  NECK:  Supple, no jugular venous distention. No thyroid enlargement, no tenderness.  LUNGS: Normal breath sounds bilaterally, no wheezing, rales,rhonchi or crepitation. No use of accessory muscles of respiration.  CARDIOVASCULAR: S1, S2 normal. No murmurs, rubs, or gallops.  ABDOMEN: Soft, nontender, with no guarding or rigidity, nondistended.  Bowel sounds present. No organomegaly or mass.  EXTREMITIES: No pedal edema, cyanosis, or clubbing.  NEUROLOGIC: Cranial nerves II through XII are intact. Muscle strength 5/5 in all extremities. Sensation intact. Gait not checked.  PSYCHIATRIC: The patient is alert and oriented x 3.  SKIN: No obvious rash, lesion, or ulcer.    LABORATORY PANEL:   CBC Recent Labs  Lab 02/16/19 0529  WBC 9.8  HGB 10.3*  HCT 32.3*  PLT 253   ------------------------------------------------------------------------------------------------------------------  Chemistries  Recent Labs  Lab 02/15/19 0947  02/17/19 0640  NA 139   < > 139  K 3.5   < > 4.0  CL 109   < > 108  CO2 18*   < > 24  GLUCOSE 119*   < > 87  BUN 18   < > 8  CREATININE 0.66   < > 0.66  CALCIUM 9.3   < > 8.6*  AST 24  --   --   ALT 19  --   --   ALKPHOS 63  --   --   BILITOT 0.5  --   --    < > = values in this interval not displayed.   ------------------------------------------------------------------------------------------------------------------  Cardiac Enzymes No results for input(s): TROPONINI in the last 168 hours. ------------------------------------------------------------------------------------------------------------------  RADIOLOGY:  US Pelvis (transabdominal Only)  Result Date: 02/15/2019 CLINICAL DATA:  Pelvic pain and vomiting. History of an ovarian dermoid. History of left oophorectomy 10/03/2017. EXAM: TRANSABDOMINAL ULTRASOUND OF PELVIS DOPPLER ULTRASOUND OF OVARIES TECHNIQUE: Transabdominal ultrasound examination of the pelvis was performed including evaluation of the uterus, ovaries, adnexal regions, and pelvic cul-de-sac. Color and duplex Doppler ultrasound was utilized to evaluate blood flow to the ovaries. COMPARISON:  Pelvic ultrasound 02/02/2019. FINDINGS: Uterus Measurements: 8.5 x  4.9 x 6.7 cm = volume: 115.3 mL. No fibroids or other mass visualized. Endometrium Thickness: 0.6.  No focal  abnormality visualized. Right ovary Measurements: 5.0 x 3.6 x 2.7 cm = volume: 25.3 mL. 2.1 x 2.2 x 2.3 cm echogenic lesion consistent with the patient's known dermoid is identified. Left ovary Removed. Pulsed Doppler evaluation demonstrates normal low-resistance arterial and venous waveforms in right ovary. Other: None. IMPRESSION: No acute abnormality. Right ovarian dermoid is seen on prior exam. Status post left oophorectomy. Electronically Signed   By: Inge Rise M.D.   On: 02/15/2019 13:23   US Pelvic Doppler (torsion R/o Or Mass Arterial Flow)  Result Date: 02/15/2019 CLINICAL DATA:  Pelvic pain and vomiting. History of an ovarian dermoid. History of left oophorectomy 10/03/2017. EXAM: TRANSABDOMINAL ULTRASOUND OF PELVIS DOPPLER ULTRASOUND OF OVARIES TECHNIQUE: Transabdominal ultrasound examination of the pelvis was performed including evaluation of the uterus, ovaries, adnexal regions, and pelvic cul-de-sac. Color and duplex Doppler ultrasound was utilized to evaluate blood flow to the ovaries. COMPARISON:  Pelvic ultrasound 02/02/2019. FINDINGS: Uterus Measurements: 8.5 x 4.9 x 6.7 cm = volume: 115.3 mL. No fibroids or other mass visualized. Endometrium Thickness: 0.6.  No focal abnormality visualized. Right ovary Measurements: 5.0 x 3.6 x 2.7 cm = volume: 25.3 mL. 2.1 x 2.2 x 2.3 cm echogenic lesion consistent with the patient's known dermoid is identified. Left ovary Removed. Pulsed Doppler evaluation demonstrates normal low-resistance arterial and venous waveforms in right ovary. Other: None. IMPRESSION: No acute abnormality. Right ovarian dermoid is seen on prior exam. Status post left oophorectomy. Electronically Signed   By: Inge Rise M.D.   On: 02/15/2019 13:23   Dg Abdomen Acute W/chest  Result Date: 02/15/2019 CLINICAL DATA:  Nausea, vomiting and diarrhea this morning. History of ovarian cystectomy and oophorectomy. EXAM: DG ABDOMEN ACUTE W/ 1V CHEST COMPARISON:  Abdominal CT  01/29/2019. FINDINGS: The heart size and mediastinal contours are normal. The lungs are clear. There is no pleural effusion or pneumothorax. No acute osseous findings are identified. The bowel gas pattern is normal. There is no free intraperitoneal air or suspicious calcification. Small pelvic phleboliths are stable. The bones appear unremarkable. IMPRESSION: No evidence of active cardiopulmonary or abdominal process. Electronically Signed   By: Richardean Sale M.D.   On: 02/15/2019 16:33    EKG:   Orders placed or performed during the hospital encounter of 01/02/16  . EKG 12-Lead  . EKG 12-Lead    ASSESSMENT AND PLAN:   23 year old female with past medical history significant for ADHD, GERD, migraines presents to hospital secondary to worsening abdominal pain and nausea vomiting.  1.  Recurrent abdominal pain with nausea and vomiting- -CT of the abdomen and pelvis without any acute findings.  Right ovarian dermoid cyst/teratoma noted. -No torsion noted.  Urine tox positive for marijuana. -Appreciate GI consult and OB/GYN consult.  Also counseled about cyclical vomiting syndrome from marijuana.  Counseled against using marijuana. -Patient will have her laparoscopic ovarian cystectomy procedure scheduled for next week. -EGD today.  Bentyl for abdominal pain.  Advance diet after EGD and possible discharge if EGD is negative  2.  Hypokalemia- replaced  3.  GERD-PPI  4.  DVT prophylaxis-on Lovenox  Independent at baseline    All the records are reviewed and case discussed with Care Management/Social Workerr. Management plans discussed with the patient, family and they are in agreement.  CODE STATUS: Full code  TOTAL TIME TAKING CARE OF THIS PATIENT: 38 minutes.   POSSIBLE D/C  today or tomorrow, DEPENDING ON CLINICAL CONDITION.   Gladstone Lighter M.D on 02/17/2019 at 11:43 AM  Between 7am to 6pm - Pager - [phone]  After 6pm go to [email or web address] - password [email or web address] [facility]  Office  [phone]  CC: Primary care physician; Arnetha Courser, MD

## 2019-02-17 NOTE — Discharge Summary (Signed)
[facility] [place] NAME: Tiffany Guzman    MR#:  [id number]  DATE OF BIRTH:  1996-01-23  DATE OF ADMISSION:  02/15/2019   ADMITTING PHYSICIAN: Saundra Shelling, MD  DATE OF DISCHARGE: 02/17/2019  3:26 PM  PRIMARY CARE PHYSICIAN: Arnetha Courser, MD   ADMISSION DIAGNOSIS:   Generalized abdominal pain [R10.84] History of ovarian cyst [Z87.42] Pelvic pain [R10.2] Vomiting, intractability of vomiting not specified, presence of nausea not specified, unspecified vomiting type [R11.10] Nausea and vomiting, intractability of vomiting not specified, unspecified vomiting type [R11.2]  DISCHARGE DIAGNOSIS:   Active Problems:   Nausea and vomiting in adult   Stomach ache   SECONDARY DIAGNOSIS:   Past Medical History:  Diagnosis Date  . Anxiety   . Attention deficit hyperactivity disorder (ADHD)   . Chronic upper back pain    lower and upper back pain  . Depression   . GERD (gastroesophageal reflux disease)   . Gestational hypertension 11/14/2017  . Migraine with aura    since elementary school    HOSPITAL COURSE:   23 year old female with past medical history significant for ADHD, GERD, migraines presents to hospital secondary to worsening abdominal pain and nausea vomiting.  1.  Recurrent abdominal pain with nausea and vomiting- could be from marijuana -CT of the abdomen and pelvis without any acute findings.  Right ovarian dermoid cyst/teratoma noted. -No torsion noted.  Urine tox positive for marijuana. -Appreciate GI consult and OB/GYN consult.  Also counseled about cyclical vomiting syndrome from marijuana.  Counseled against using marijuana. -Patient will have her laparoscopic ovarian cystectomy procedure scheduled for next week. -EGD done today was completely normal.. -also started  Bentyl for abdominal pain.  tolerating diet well. Also concern for IBS given h/o anxiety, depression and constipation type.   2.   Hypokalemia- replaced  3.  GERD-PPI prn OTC  Independent at baseline Being discharged today  DISCHARGE CONDITIONS:   Guarded  CONSULTS OBTAINED:   Treatment Team:  Homero Fellers, MD Virgel Manifold, MD  DRUG ALLERGIES:   No Known Allergies DISCHARGE MEDICATIONS:   Allergies as of 02/17/2019   No Known Allergies     Medication List    STOP taking these medications   Capsaicin 0.1 % Crea   ketorolac 10 MG tablet Commonly known as: TORADOL   ondansetron 4 MG disintegrating tablet Commonly known as: ZOFRAN-ODT     TAKE these medications   dicyclomine 10 MG capsule Commonly known as: BENTYL Take 1 capsule (10 mg total) by mouth 3 (three) times daily before meals.   promethazine 12.5 MG tablet Commonly known as: PHENERGAN Take 1 tablet (12.5 mg total) by mouth every 6 (six) hours as needed for nausea or vomiting.        DISCHARGE INSTRUCTIONS:   1. PCP f/u in 1-2 weeks 2. ObGyn f/u next week for ovarian cystectomy  DIET:   Regular diet  ACTIVITY:   Activity as tolerated  OXYGEN:   Home Oxygen: No.  Oxygen Delivery: room air  DISCHARGE LOCATION:   home   If you experience worsening of your admission symptoms, develop shortness of breath, life threatening emergency, suicidal or homicidal thoughts you must seek medical attention immediately by calling 911 or calling your MD immediately  if symptoms less severe.  You Must read complete instructions/literature along with all the possible adverse reactions/side effects for all the Medicines you take and that have been prescribed to you. Take any new  Medicines after you have completely understood and accpet all the possible adverse reactions/side effects.   Please note  You were cared for by a hospitalist during your hospital stay. If you have any questions about your discharge medications or the care you received while you were in the hospital after you are discharged, you can call the  unit and asked to speak with the hospitalist on call if the hospitalist that took care of you is not available. Once you are discharged, your primary care physician will handle any further medical issues. Please note that NO REFILLS for any discharge medications will be authorized once you are discharged, as it is imperative that you return to your primary care physician (or establish a relationship with a primary care physician if you do not have one) for your aftercare needs so that they can reassess your need for medications and monitor your lab values.    On the day of Discharge:  VITAL SIGNS:   Blood pressure (!) 127/93, pulse 65, temperature 98.8 F (37.1 C), temperature source Oral, resp. rate (!) 22, height 5\' 4"  (1.626 m), weight 59 kg, last menstrual period 02/13/2019, SpO2 99 %, not currently breastfeeding.  PHYSICAL EXAMINATION:    GENERAL:  23 y.o.-year-old patient lying in the bed with no acute distress.  EYES: Pupils equal, round, reactive to light and accommodation. No scleral icterus. Extraocular muscles intact.  HEENT: Head atraumatic, normocephalic. Oropharynx and nasopharynx clear.  NECK:  Supple, no jugular venous distention. No thyroid enlargement, no tenderness.  LUNGS: Normal breath sounds bilaterally, no wheezing, rales,rhonchi or crepitation. No use of accessory muscles of respiration.  CARDIOVASCULAR: S1, S2 normal. No murmurs, rubs, or gallops.  ABDOMEN: Soft, nontender, with no guarding or rigidity, nondistended. Bowel sounds present. No organomegaly or mass.  EXTREMITIES: No pedal edema, cyanosis, or clubbing.  NEUROLOGIC: Cranial nerves II through XII are intact. Muscle strength 5/5 in all extremities. Sensation intact. Gait not checked.  PSYCHIATRIC: The patient is alert and oriented x 3.  SKIN: No obvious rash, lesion, or ulcer.   DATA REVIEW:   CBC Recent Labs  Lab 02/16/19 0529  WBC 9.8  HGB 10.3*  HCT 32.3*  PLT 253    Chemistries  Recent Labs   Lab 02/15/19 0947  02/17/19 0640  NA 139   < > 139  K 3.5   < > 4.0  CL 109   < > 108  CO2 18*   < > 24  GLUCOSE 119*   < > 87  BUN 18   < > 8  CREATININE 0.66   < > 0.66  CALCIUM 9.3   < > 8.6*  AST 24  --   --   ALT 19  --   --   ALKPHOS 63  --   --   BILITOT 0.5  --   --    < > = values in this interval not displayed.     Microbiology Results  Results for orders placed or performed during the hospital encounter of 02/15/19  SARS Coronavirus 2 (CEPHEID - Performed in [facility] hospital lab), Hosp Order     Status: None   Collection Time: 02/15/19  4:56 PM   Specimen: Nasopharyngeal  Result Value Ref Range Status   SARS Coronavirus 2 NEGATIVE NEGATIVE Final    Comment: (NOTE) If result is NEGATIVE SARS-CoV-2 target nucleic acids are NOT DETECTED. The SARS-CoV-2 RNA is generally detectable in upper and lower  respiratory specimens during the acute  phase of infection. The lowest  concentration of SARS-CoV-2 viral copies this assay can detect is 250  copies / mL. A negative result does not preclude SARS-CoV-2 infection  and should not be used as the sole basis for treatment or other  patient management decisions.  A negative result may occur with  improper specimen collection / handling, submission of specimen other  than nasopharyngeal swab, presence of viral mutation(s) within the  areas targeted by this assay, and inadequate number of viral copies  (<250 copies / mL). A negative result must be combined with clinical  observations, patient history, and epidemiological information. If result is POSITIVE SARS-CoV-2 target nucleic acids are DETECTED. The SARS-CoV-2 RNA is generally detectable in upper and lower  respiratory specimens dur ing the acute phase of infection.  Positive  results are indicative of active infection with SARS-CoV-2.  Clinical  correlation with patient history and other diagnostic information is  necessary to determine patient infection status.   Positive results do  not rule out bacterial infection or co-infection with other viruses. If result is PRESUMPTIVE POSTIVE SARS-CoV-2 nucleic acids MAY BE PRESENT.   A presumptive positive result was obtained on the submitted specimen  and confirmed on repeat testing.  While 2019 novel coronavirus  (SARS-CoV-2) nucleic acids may be present in the submitted sample  additional confirmatory testing may be necessary for epidemiological  and / or clinical management purposes  to differentiate between  SARS-CoV-2 and other Sarbecovirus currently known to infect humans.  If clinically indicated additional testing with an alternate test  methodology [phone]) is advised. The SARS-CoV-2 RNA is generally  detectable in upper and lower respiratory sp ecimens during the acute  phase of infection. The expected result is Negative. Fact Sheet for Patients:  StrictlyIdeas.no Fact Sheet for Healthcare Providers: BankingDealers.co.za This test is not yet approved or cleared by the [place] FDA and has been authorized for detection and/or diagnosis of SARS-CoV-2 by FDA under an Emergency Use Authorization (EUA).  This EUA will remain in effect (meaning this test can be used) for the duration of the COVID-19 declaration under Section 564(b)(1) of the Act, 21 U.S.C. section 360bbb-3(b)(1), unless the authorization is terminated or revoked sooner. Performed at [facility], [address] [place], [place] [address]   MRSA PCR Screening     Status: None   Collection Time: 02/15/19  6:28 PM   Specimen: Nasopharyngeal  Result Value Ref Range Status   MRSA by PCR NEGATIVE NEGATIVE Final    Comment:        The GeneXpert MRSA Assay (FDA approved for NASAL specimens only), is one component of a comprehensive MRSA colonization surveillance program. It is not intended to diagnose MRSA infection nor to guide or monitor treatment for MRSA  infections. Performed at [facility], [address], [place], [place] [address]     RADIOLOGY:  No results found.   Management plans discussed with the patient, family and they are in agreement.  CODE STATUS:     Code Status Orders  (From admission, onward)         Start     Ordered   02/15/19 1624  Full code  Continuous     02/15/19 1623        Code Status History    Date Active Date Inactive Code Status Order ID Comments User Context   02/02/2019 0526 02/03/2019 1916 Full Code [id number]  Harrie Foreman, MD Inpatient   11/15/2017 1743 11/18/2017 1908  Full Code [id number]  Dalia Heading, [facility]   11/14/2017 1326 11/15/2017 1743 Full Code [id number]  Will Bonnet, MD Inpatient   10/03/2017 1401 10/07/2017 2045 Full Code [id number]  Homero Fellers, MD Inpatient   10/01/2017 1947 10/03/2017 1400 Full Code [id number]  Gae Dry, MD Inpatient   09/04/2017 2242 09/05/2017 1741 Full Code [id number]  Malachy Mood, MD Inpatient   09/04/2017 1724 09/04/2017 2242 Full Code [id number]  Malachy Mood, MD Inpatient   Advance Care Planning Activity      TOTAL TIME TAKING CARE OF THIS PATIENT: 38 minutes.    Gladstone Lighter M.D on 02/17/2019 at 3:33 PM  Between 7am to 6pm - Pager - [phone]  After 6pm go to [email or web address] - Proofreader  [facility]  Office  [phone]  CC: Primary care physician; Arnetha Courser, MD   Note: This dictation was prepared with Dragon dictation along with smaller phrase technology. Any transcriptional errors that result from this process are unintentional.

## 2019-02-17 NOTE — Progress Notes (Signed)
Tiffany Guzman to be D/C'd Home per MD order.  Discussed prescriptions and follow up appointments with the patient. Prescriptions given to patient, medication list explained in detail. Pt verbalized understanding.  Allergies as of 02/17/2019   No Known Allergies     Medication List    STOP taking these medications   Capsaicin 0.1 % Crea   ketorolac 10 MG tablet Commonly known as: TORADOL   ondansetron 4 MG disintegrating tablet Commonly known as: ZOFRAN-ODT     TAKE these medications   dicyclomine 10 MG capsule Commonly known as: BENTYL Take 1 capsule (10 mg total) by mouth 3 (three) times daily before meals.   promethazine 12.5 MG tablet Commonly known as: PHENERGAN Take 1 tablet (12.5 mg total) by mouth every 6 (six) hours as needed for nausea or vomiting.       Vitals:   02/17/19 1205 02/17/19 1247  BP: 133/86 (!) 127/93  Pulse:  65  Resp:  (!) 22  Temp:  98.8 F (37.1 C)  SpO2:  99%    Skin clean, dry and intact without evidence of skin break down, no evidence of skin tears noted. IV catheter discontinued intact. Site without signs and symptoms of complications. Dressing and pressure applied. Pt denies pain at this time. No complaints noted.  An After Visit Summary was printed and given to the patient. Patient escorted via [place], and D/C home via private auto.  Fuller Mandril, RN

## 2019-02-18 ENCOUNTER — Other Ambulatory Visit: Admit: 2019-02-18 | Payer: Medicaid Other

## 2019-02-18 ENCOUNTER — Encounter: Payer: Self-pay | Admitting: Gastroenterology

## 2019-02-18 ENCOUNTER — Telehealth: Payer: Self-pay

## 2019-02-18 LAB — SURGICAL PATHOLOGY

## 2019-02-18 LAB — TISSUE TRANSGLUTAMINASE, IGA: Tissue Transglutaminase Ab, IgA: <2 U/mL (ref 0–3)

## 2019-02-18 NOTE — Telephone Encounter (Signed)
Attempted to reach patient for TCM call and confirm hospital follow up appt. Unable to leave message due to voicemail not set up.

## 2019-02-19 ENCOUNTER — Other Ambulatory Visit: Admission: RE | Admit: 2019-02-19 | Payer: Medicaid Other | Source: Ambulatory Visit

## 2019-02-19 ENCOUNTER — Ambulatory Visit (INDEPENDENT_AMBULATORY_CARE_PROVIDER_SITE_OTHER): Payer: Medicaid Other | Admitting: Obstetrics and Gynecology

## 2019-02-19 ENCOUNTER — Encounter
Admission: RE | Admit: 2019-02-19 | Discharge: 2019-02-19 | Disposition: A | Discharge status: Home / Self Care | Payer: Medicaid Other | Source: Ambulatory Visit | Attending: Obstetrics and Gynecology | Admitting: Obstetrics and Gynecology

## 2019-02-19 ENCOUNTER — Encounter: Payer: Self-pay | Admitting: Obstetrics and Gynecology

## 2019-02-19 ENCOUNTER — Other Ambulatory Visit: Payer: Self-pay

## 2019-02-19 VITALS — BP 110/64 | HR 102 | Ht 64.0 in | Wt 127.0 lb

## 2019-02-19 DIAGNOSIS — D369 Benign neoplasm, unspecified site: Secondary | ICD-10-CM

## 2019-02-19 DIAGNOSIS — N83201 Unspecified ovarian cyst, right side: Secondary | ICD-10-CM

## 2019-02-19 HISTORY — DX: Dyspnea, unspecified: R06.00

## 2019-02-19 LAB — CBC
HCT: 37.5 % (ref 36.0–46.0)
Hemoglobin: 12.2 g/dL (ref 12.0–15.0)
MCH: 27.4 pg (ref 26.0–34.0)
MCHC: 32.5 g/dL (ref 30.0–36.0)
MCV: 84.1 fL (ref 80.0–100.0)
Platelets: 337 10*3/uL (ref 150–400)
RBC: 4.46 MIL/uL (ref 3.87–5.11)
RDW: 13 % (ref 11.5–15.5)
WBC: 9.1 10*3/uL (ref 4.0–10.5)
nRBC: 0 % (ref 0.0–0.2)

## 2019-02-19 LAB — TYPE AND SCREEN
ABO/RH(D): B POS
Antibody Screen: NEGATIVE

## 2019-02-19 LAB — SARS CORONAVIRUS 2 (TAT 6-24 HRS): SARS Coronavirus 2: NEGATIVE

## 2019-02-19 NOTE — H&P (View-Only) (Signed)
Patient ID: Tiffany Guzman, female   DOB: Jun 13, 1996, 23 y.o.   MRN: [id number]  Reason for Consult: Pre-op Exam (H&P for ovary cystecomy 02/23/2019)   Referred by Arnetha Courser, MD  Subjective:     HPI:  Tiffany Guzman is a 23 y.o. female. She has a 2 cm dermoid cyst on her right ovary. She has recently be hospitalized for nausea and abdominal pain which is improved today. She would like to have the right dermoid cyst removed, especially because she has already lost her left ovary to a 12cm dermoid which necessitated removal during her recent pregnancy.   Past Medical History:  Diagnosis Date  . Anxiety   . Attention deficit hyperactivity disorder (ADHD)   . Chronic upper back pain    lower and upper back pain  . Depression   . Dyspnea   . GERD (gastroesophageal reflux disease)   . Gestational hypertension 11/14/2017  . Migraine with aura    since elementary school   Family History  Problem Relation Age of Onset  . Hypertension Mother   . Hypothyroidism Mother   . Depression Mother   . Diabetes Maternal Grandmother    Past Surgical History:  Procedure Laterality Date  . CESAREAN SECTION N/A 11/14/2017   Procedure: CESAREAN SECTION;  Surgeon: Will Bonnet, MD;  Location: [facility] ORS;  Service: Obstetrics;  Laterality: N/A;  . ESOPHAGOGASTRODUODENOSCOPY Left 02/17/2019   Procedure: ESOPHAGOGASTRODUODENOSCOPY (EGD);  Surgeon: Virgel Manifold, MD;  Location: [facility] ENDOSCOPY;  Service: Endoscopy;  Laterality: Left;  . INSERTION OF NON VAGINAL CONTRACEPTIVE DEVICE    . OOPHORECTOMY Left 10/03/2017   Procedure: OOPHORECTOMY;  Surgeon: Homero Fellers, MD;  Location: [facility] ORS;  Service: Gynecology;  Laterality: Left;  . OVARIAN CYST REMOVAL Left 10/03/2017   Procedure: OVARIAN CYSTECTOMY;  Surgeon: Homero Fellers, MD;  Location: [facility] ORS;  Service: Gynecology;  Laterality: Left;    Short Social History:  Social History   Tobacco Use  . Smoking  status: Never Smoker  . Smokeless tobacco: Never Used  Substance Use Topics  . Alcohol use: No    Alcohol/week: 0.0 standard drinks    No Known Allergies  Current Outpatient Medications  Medication Sig Dispense Refill  . dicyclomine (BENTYL) 10 MG capsule Take 1 capsule (10 mg total) by mouth 3 (three) times daily before meals. 90 capsule 0  . promethazine (PHENERGAN) 12.5 MG tablet Take 1 tablet (12.5 mg total) by mouth every 6 (six) hours as needed for nausea or vomiting. 20 tablet 0   No current facility-administered medications for this visit.     Review of Systems  Constitutional: Negative for chills, fatigue, fever and unexpected weight change.  HENT: Negative for trouble swallowing.  Eyes: Negative for loss of vision.  Respiratory: Negative for cough, shortness of breath and wheezing.  Cardiovascular: Negative for chest pain, leg swelling, palpitations and syncope.  GI: Negative for abdominal pain, blood in stool, diarrhea, nausea and vomiting.  GU: Negative for difficulty urinating, dysuria, frequency and hematuria.  Musculoskeletal: Negative for back pain, leg pain and joint pain.  Skin: Negative for rash.  Neurological: Negative for dizziness, headaches, light-headedness, numbness and seizures.  Psychiatric: Negative for behavioral problem, confusion, depressed mood and sleep disturbance.        Objective:  Objective   Vitals:   02/19/19 0854  BP: 110/64  Pulse: (!) 102  Weight: 127 lb (57.6 kg)  Height: 5\' 4"  (1.626 m)   Body mass index  is 21.8 kg/m.  Physical Exam Vitals signs and nursing note reviewed.  Constitutional:      Appearance: She is well-developed.  HENT:     Head: Normocephalic and atraumatic.  Eyes:     Pupils: Pupils are equal, round, and reactive to light.  Cardiovascular:     Rate and Rhythm: Normal rate and regular rhythm.  Pulmonary:     Effort: Pulmonary effort is normal. No respiratory distress.  Abdominal:     General: Abdomen  is flat. There is no distension.     Palpations: Abdomen is soft. There is no mass.     Tenderness: There is no abdominal tenderness.     Hernia: No hernia is present.  Skin:    General: Skin is warm and dry.  Neurological:     Mental Status: She is alert and oriented to person, place, and time.  Psychiatric:        Behavior: Behavior normal.        Thought Content: Thought content normal.        Judgment: Judgment normal.         Assessment/Plan:     23 yo with right ovarian dermoid cyst Discussed surgical risks benefits and alternatives. Will proceed with laparoscopic right ovarian cystectomy. Possible oophorectomy. All efforts will be made to conserve the right ovary. She understands the risks of damage to the right ovary and the possibility of a oophorectomy which would necessitate life long estrogen replacement. Surgical risks of bleeding, infection and damage to surrounding pelvic structures discussed in detail. All questions answered.   More than 25 minutes were spent face to face with the patient in the room with more than 50% of the time spent providing counseling and discussing the plan of management.   Adrian Prows MD [facility] 02/23/2019 6:43 AM

## 2019-02-19 NOTE — Patient Instructions (Signed)
Your procedure is scheduled on: 02/23/2019 Tues Report to Same Day Surgery 2nd floor medical mall [facility] Entrance-take elevator on left to 2nd floor.  Check in with surgery information desk.) To find out your arrival time please call [phone] between 1PM - 3PM on 02/22/2019 Mon  Remember: Instructions that are not followed completely may result in serious medical risk, up to and including death, or upon the discretion of your surgeon and anesthesiologist your surgery may need to be rescheduled.    _x___ 1. Do not eat food after midnight the night before your procedure. You may drink clear liquids up to 2 hours before you are scheduled to arrive at the hospital for your procedure.  Do not drink clear liquids within 2 hours of your scheduled arrival to the hospital.  Clear liquids include  --Water or Apple juice without pulp  --Clear carbohydrate beverage such as ClearFast or Gatorade  --Black Coffee or Clear Tea (No milk, no creamers, do not add anything to                  the coffee or Tea Type 1 and type 2 diabetics should only drink water.   ____Ensure clear carbohydrate drink on the way to the hospital for bariatric patients  _x__Ensure clear carbohydrate drink 3 hours before surgery.   No gum chewing or hard candies.     __x__ 2. No Alcohol for 24 hours before or after surgery.   __x__3. No Smoking or e-cigarettes for 24 prior to surgery.  Do not use any chewable tobacco products for at least 6 hour prior to surgery   ____  4. Bring all medications with you on the day of surgery if instructed.    __x__ 5. Notify your doctor if there is any change in your medical condition     (cold, fever, infections).    x___6. On the morning of surgery brush your teeth with toothpaste and water.  You may rinse your mouth with mouth wash if you wish.  Do not swallow any toothpaste or mouthwash.   Do not wear jewelry, make-up, hairpins, clips or nail polish.  Do not wear lotions,  powders, or perfumes. You may wear deodorant.  Do not shave 48 hours prior to surgery. Men may shave face and neck.  Do not bring valuables to the hospital.    [facility] is not responsible for any belongings or valuables.               Contacts, dentures or bridgework may not be worn into surgery.  Leave your suitcase in the car. After surgery it may be brought to your room.  For patients admitted to the hospital, discharge time is determined by your                       treatment team.  _  Patients discharged the day of surgery will not be allowed to drive home.  You will need someone to drive you home and stay with you the night of your procedure.    Please read over the following fact sheets that you were given:   [facility] Preparing for Surgery and or MRSA Information   _x___ Take anti-hypertensive listed below, cardiac, seizure, asthma,     anti-reflux and psychiatric medicines. These include:  1. None  2.  3.  4.  5.  6.  ____Fleets enema or Magnesium Citrate as directed.   _x___ Use CHG Soap or  sage wipes as directed on instruction sheet   ____ Use inhalers on the day of surgery and bring to hospital day of surgery  ____ Stop Metformin and Janumet 2 days prior to surgery.    ____ Take 1/2 of usual insulin dose the night before surgery and none on the morning     surgery.   _x___ Follow recommendations from Cardiologist, Pulmonologist or PCP regarding          stopping Aspirin, Coumadin, Plavix ,Eliquis, Effient, or Pradaxa, and Pletal.  X____Stop Anti-inflammatories such as Advil, Aleve, Ibuprofen, Motrin, Naproxen, Naprosyn, Goodies powders or aspirin products. OK to take Tylenol and                          Celebrex.   _x___ Stop supplements until after surgery.  But may continue Vitamin D, Vitamin B,       and multivitamin.   ____ Bring C-Pap to the hospital.

## 2019-02-19 NOTE — Progress Notes (Signed)
Patient ID: Tiffany Guzman, female   DOB: [birth year], 23 y.o.   MRN: [id number]  Reason for Consult: Pre-op Exam (H&P for ovary cystecomy 02/23/2019)   Referred by Arnetha Courser, MD  Subjective:     HPI:  Tiffany Guzman is a 23 y.o. female. She has a 2 cm dermoid cyst on her right ovary. She has recently be hospitalized for nausea and abdominal pain which is improved today. She would like to have the right dermoid cyst removed, especially because she has already lost her left ovary to a 12cm dermoid which necessitated removal during her recent pregnancy.   Past Medical History:  Diagnosis Date  . Anxiety   . Attention deficit hyperactivity disorder (ADHD)   . Chronic upper back pain    lower and upper back pain  . Depression   . Dyspnea   . GERD (gastroesophageal reflux disease)   . Gestational hypertension 11/14/2017  . Migraine with aura    since elementary school   Family History  Problem Relation Age of Onset  . Hypertension Mother   . Hypothyroidism Mother   . Depression Mother   . Diabetes Maternal Grandmother    Past Surgical History:  Procedure Laterality Date  . CESAREAN SECTION N/A 11/14/2017   Procedure: CESAREAN SECTION;  Surgeon: Will Bonnet, MD;  Location: [facility] ORS;  Service: Obstetrics;  Laterality: N/A;  . ESOPHAGOGASTRODUODENOSCOPY Left 02/17/2019   Procedure: ESOPHAGOGASTRODUODENOSCOPY (EGD);  Surgeon: Virgel Manifold, MD;  Location: [facility] ENDOSCOPY;  Service: Endoscopy;  Laterality: Left;  . INSERTION OF NON VAGINAL CONTRACEPTIVE DEVICE    . OOPHORECTOMY Left 10/03/2017   Procedure: OOPHORECTOMY;  Surgeon: Homero Fellers, MD;  Location: [facility] ORS;  Service: Gynecology;  Laterality: Left;  . OVARIAN CYST REMOVAL Left 10/03/2017   Procedure: OVARIAN CYSTECTOMY;  Surgeon: Homero Fellers, MD;  Location: [facility] ORS;  Service: Gynecology;  Laterality: Left;    Short Social History:  Social History   Tobacco Use  . Smoking  status: Never Smoker  . Smokeless tobacco: Never Used  Substance Use Topics  . Alcohol use: No    Alcohol/week: 0.0 standard drinks    No Known Allergies  Current Outpatient Medications  Medication Sig Dispense Refill  . dicyclomine (BENTYL) 10 MG capsule Take 1 capsule (10 mg total) by mouth 3 (three) times daily before meals. 90 capsule 0  . promethazine (PHENERGAN) 12.5 MG tablet Take 1 tablet (12.5 mg total) by mouth every 6 (six) hours as needed for nausea or vomiting. 20 tablet 0   No current facility-administered medications for this visit.     Review of Systems  Constitutional: Negative for chills, fatigue, fever and unexpected weight change.  HENT: Negative for trouble swallowing.  Eyes: Negative for loss of vision.  Respiratory: Negative for cough, shortness of breath and wheezing.  Cardiovascular: Negative for chest pain, leg swelling, palpitations and syncope.  GI: Negative for abdominal pain, blood in stool, diarrhea, nausea and vomiting.  GU: Negative for difficulty urinating, dysuria, frequency and hematuria.  Musculoskeletal: Negative for back pain, leg pain and joint pain.  Skin: Negative for rash.  Neurological: Negative for dizziness, headaches, light-headedness, numbness and seizures.  Psychiatric: Negative for behavioral problem, confusion, depressed mood and sleep disturbance.        Objective:  Objective   Vitals:   02/19/19 0854  BP: 110/64  Pulse: (!) 102  Weight: 127 lb (57.6 kg)  Height: 5\' 4"  (1.626 m)   Body mass index  is 21.8 kg/m.  Physical Exam Vitals signs and nursing note reviewed.  Constitutional:      Appearance: She is well-developed.  HENT:     Head: Normocephalic and atraumatic.  Eyes:     Pupils: Pupils are equal, round, and reactive to light.  Cardiovascular:     Rate and Rhythm: Normal rate and regular rhythm.  Pulmonary:     Effort: Pulmonary effort is normal. No respiratory distress.  Abdominal:     General: Abdomen  is flat. There is no distension.     Palpations: Abdomen is soft. There is no mass.     Tenderness: There is no abdominal tenderness.     Hernia: No hernia is present.  Skin:    General: Skin is warm and dry.  Neurological:     Mental Status: She is alert and oriented to person, place, and time.  Psychiatric:        Behavior: Behavior normal.        Thought Content: Thought content normal.        Judgment: Judgment normal.         Assessment/Plan:     23 yo with right ovarian dermoid cyst Discussed surgical risks benefits and alternatives. Will proceed with laparoscopic right ovarian cystectomy. Possible oophorectomy. All efforts will be made to conserve the right ovary. She understands the risks of damage to the right ovary and the possibility of a oophorectomy which would necessitate life long estrogen replacement. Surgical risks of bleeding, infection and damage to surrounding pelvic structures discussed in detail. All questions answered.   More than 25 minutes were spent face to face with the patient in the room with more than 50% of the time spent providing counseling and discussing the plan of management.   Adrian Prows MD [facility] 02/23/2019 6:43 AM

## 2019-02-23 ENCOUNTER — Ambulatory Visit: Payer: Medicaid Other | Admitting: Registered Nurse

## 2019-02-23 ENCOUNTER — Other Ambulatory Visit: Payer: Self-pay

## 2019-02-23 ENCOUNTER — Ambulatory Visit
Admission: RE | Admit: 2019-02-23 | Discharge: 2019-02-23 | Disposition: A | Discharge status: Home / Self Care | Payer: Medicaid Other | Attending: Obstetrics and Gynecology | Admitting: Obstetrics and Gynecology

## 2019-02-23 ENCOUNTER — Encounter
Admission: RE | Disposition: A | Discharge status: Home / Self Care | Payer: Self-pay | Source: Home / Self Care | Attending: Obstetrics and Gynecology

## 2019-02-23 DIAGNOSIS — Z79899 Other long term (current) drug therapy: Secondary | ICD-10-CM | POA: Insufficient documentation

## 2019-02-23 DIAGNOSIS — D270 Benign neoplasm of right ovary: Secondary | ICD-10-CM | POA: Diagnosis not present

## 2019-02-23 HISTORY — PX: LAPAROSCOPIC OVARIAN CYSTECTOMY: SHX6248

## 2019-02-23 LAB — URINE DRUG SCREEN, QUALITATIVE (ARMC ONLY)
Amphetamines, Ur Screen: NOT DETECTED
Barbiturates, Ur Screen: NOT DETECTED
Benzodiazepine, Ur Scrn: NOT DETECTED
Cannabinoid 50 Ng, Ur ~~LOC~~: POSITIVE — AB
Cocaine Metabolite,Ur ~~LOC~~: NOT DETECTED
MDMA (Ecstasy)Ur Screen: NOT DETECTED
Methadone Scn, Ur: NOT DETECTED
Opiate, Ur Screen: NOT DETECTED
Phencyclidine (PCP) Ur S: NOT DETECTED
Tricyclic, Ur Screen: NOT DETECTED

## 2019-02-23 LAB — POCT PREGNANCY, URINE: Preg Test, Ur: NEGATIVE

## 2019-02-23 SURGERY — EXCISION, CYST, OVARY, LAPAROSCOPIC
Anesthesia: General | Laterality: Right

## 2019-02-23 MED ORDER — FENTANYL CITRATE (PF) 100 MCG/2ML IJ SOLN
25.0000 ug | INTRAMUSCULAR | Status: AC | PRN
  Administered 2019-02-23 (×6): 25 ug via INTRAVENOUS

## 2019-02-23 MED ORDER — ROCURONIUM BROMIDE 100 MG/10ML IV SOLN
INTRAVENOUS | Status: DC | PRN
  Administered 2019-02-23: 10 mg via INTRAVENOUS
  Administered 2019-02-23: 30 mg via INTRAVENOUS

## 2019-02-23 MED ORDER — BUPIVACAINE HCL (PF) 0.5 % IJ SOLN
INTRAMUSCULAR | Status: AC
  Filled 2019-02-23: qty 30

## 2019-02-23 MED ORDER — SUGAMMADEX SODIUM 200 MG/2ML IV SOLN
INTRAVENOUS | Status: DC | PRN
  Administered 2019-02-23: 125 mg via INTRAVENOUS

## 2019-02-23 MED ORDER — ONDANSETRON HCL 4 MG/2ML IJ SOLN
INTRAMUSCULAR | Status: AC
  Filled 2019-02-23: qty 2

## 2019-02-23 MED ORDER — SODIUM CHLORIDE (PF) 0.9 % IJ SOLN
INTRAMUSCULAR | Status: AC
  Filled 2019-02-23: qty 10

## 2019-02-23 MED ORDER — PROPOFOL 10 MG/ML IV BOLUS
INTRAVENOUS | Status: DC | PRN
  Administered 2019-02-23: 150 mg via INTRAVENOUS
  Administered 2019-02-23: 50 mg via INTRAVENOUS

## 2019-02-23 MED ORDER — FENTANYL CITRATE (PF) 100 MCG/2ML IJ SOLN
INTRAMUSCULAR | Status: DC | PRN
  Administered 2019-02-23: 25 ug via INTRAVENOUS
  Administered 2019-02-23 (×3): 50 ug via INTRAVENOUS
  Administered 2019-02-23: 25 ug via INTRAVENOUS

## 2019-02-23 MED ORDER — FAMOTIDINE 20 MG PO TABS
20.0000 mg | ORAL_TABLET | Freq: Once | ORAL | Status: AC
  Administered 2019-02-23: 20 mg via ORAL

## 2019-02-23 MED ORDER — HYDROMORPHONE HCL 1 MG/ML IJ SOLN
INTRAMUSCULAR | Status: AC
  Administered 2019-02-23: 13:00:00 0.25 mg via INTRAVENOUS
  Filled 2019-02-23: qty 1

## 2019-02-23 MED ORDER — LACTATED RINGERS IV SOLN
INTRAVENOUS | Status: DC
  Administered 2019-02-23: 08:00:00 via INTRAVENOUS

## 2019-02-23 MED ORDER — SUGAMMADEX SODIUM 200 MG/2ML IV SOLN
INTRAVENOUS | Status: AC
  Filled 2019-02-23: qty 2

## 2019-02-23 MED ORDER — IBUPROFEN 800 MG PO TABS
800.0000 mg | ORAL_TABLET | Freq: Three times a day (TID) | ORAL | Status: DC | PRN
  Administered 2019-02-23: 800 mg via ORAL
  Filled 2019-02-23: qty 1

## 2019-02-23 MED ORDER — FENTANYL CITRATE (PF) 100 MCG/2ML IJ SOLN
INTRAMUSCULAR | Status: AC
  Filled 2019-02-23: qty 2

## 2019-02-23 MED ORDER — HYDROCODONE-ACETAMINOPHEN 5-325 MG PO TABS: 1.0000 | ORAL_TABLET | Freq: Four times a day (QID) | ORAL | 0 refills | Status: AC | PRN

## 2019-02-23 MED ORDER — PROPOFOL 10 MG/ML IV BOLUS
INTRAVENOUS | Status: AC
  Filled 2019-02-23: qty 20

## 2019-02-23 MED ORDER — GLYCOPYRROLATE 0.2 MG/ML IJ SOLN
INTRAMUSCULAR | Status: AC
  Filled 2019-02-23: qty 1

## 2019-02-23 MED ORDER — HYDROMORPHONE HCL 1 MG/ML IJ SOLN
0.2500 mg | INTRAMUSCULAR | Status: DC | PRN
  Administered 2019-02-23 (×6): 0.25 mg via INTRAVENOUS

## 2019-02-23 MED ORDER — FAMOTIDINE 20 MG PO TABS
ORAL_TABLET | ORAL | Status: AC
  Administered 2019-02-23: 20 mg via ORAL
  Filled 2019-02-23: qty 1

## 2019-02-23 MED ORDER — FENTANYL CITRATE (PF) 100 MCG/2ML IJ SOLN
INTRAMUSCULAR | Status: AC
  Administered 2019-02-23: 25 ug via INTRAVENOUS
  Filled 2019-02-23: qty 2

## 2019-02-23 MED ORDER — KETOROLAC TROMETHAMINE 30 MG/ML IJ SOLN
INTRAMUSCULAR | Status: DC | PRN
  Administered 2019-02-23: 15 mg via INTRAVENOUS

## 2019-02-23 MED ORDER — ONDANSETRON HCL 4 MG/2ML IJ SOLN
INTRAMUSCULAR | Status: DC | PRN
  Administered 2019-02-23: 4 mg via INTRAVENOUS

## 2019-02-23 MED ORDER — DEXAMETHASONE SODIUM PHOSPHATE 10 MG/ML IJ SOLN
INTRAMUSCULAR | Status: AC
  Filled 2019-02-23: qty 1

## 2019-02-23 MED ORDER — ROCURONIUM BROMIDE 50 MG/5ML IV SOLN
INTRAVENOUS | Status: AC
  Filled 2019-02-23: qty 1

## 2019-02-23 MED ORDER — PROMETHAZINE HCL 25 MG/ML IJ SOLN
INTRAMUSCULAR | Status: AC
  Filled 2019-02-23: qty 1

## 2019-02-23 MED ORDER — DEXAMETHASONE SODIUM PHOSPHATE 10 MG/ML IJ SOLN
INTRAMUSCULAR | Status: DC | PRN
  Administered 2019-02-23: 5 mg via INTRAVENOUS

## 2019-02-23 MED ORDER — IBUPROFEN 800 MG PO TABS
ORAL_TABLET | ORAL | Status: AC
  Filled 2019-02-23: qty 1

## 2019-02-23 MED ORDER — LACTATED RINGERS IV SOLN: INTRAVENOUS | Status: DC

## 2019-02-23 MED ORDER — LIDOCAINE HCL (CARDIAC) PF 100 MG/5ML IV SOSY
PREFILLED_SYRINGE | INTRAVENOUS | Status: DC | PRN
  Administered 2019-02-23: 80 mg via INTRAVENOUS

## 2019-02-23 MED ORDER — DEXMEDETOMIDINE HCL IN NACL 80 MCG/20ML IV SOLN
INTRAVENOUS | Status: AC
  Filled 2019-02-23: qty 20

## 2019-02-23 MED ORDER — EPHEDRINE SULFATE 50 MG/ML IJ SOLN
INTRAMUSCULAR | Status: AC
  Filled 2019-02-23: qty 1

## 2019-02-23 MED ORDER — DEXMEDETOMIDINE HCL 200 MCG/2ML IV SOLN
INTRAVENOUS | Status: DC | PRN
  Administered 2019-02-23 (×2): 8 ug via INTRAVENOUS

## 2019-02-23 MED ORDER — MEPERIDINE HCL 50 MG/ML IJ SOLN
6.2500 mg | INTRAMUSCULAR | Status: DC | PRN
  Administered 2019-02-23: 12.5 mg via INTRAVENOUS

## 2019-02-23 MED ORDER — IBUPROFEN 800 MG PO TABS: 800.0000 mg | ORAL_TABLET | Freq: Three times a day (TID) | ORAL | 1 refills | Status: DC | PRN

## 2019-02-23 MED ORDER — MEPERIDINE HCL 50 MG/ML IJ SOLN
INTRAMUSCULAR | Status: AC
  Filled 2019-02-23: qty 1

## 2019-02-23 MED ORDER — MIDAZOLAM HCL 2 MG/2ML IJ SOLN
INTRAMUSCULAR | Status: DC | PRN
  Administered 2019-02-23: 2 mg via INTRAVENOUS

## 2019-02-23 MED ORDER — PROMETHAZINE HCL 25 MG/ML IJ SOLN
6.2500 mg | Freq: Once | INTRAMUSCULAR | Status: AC
  Administered 2019-02-23: 13:00:00 6.25 mg via INTRAVENOUS

## 2019-02-23 MED ORDER — KETOROLAC TROMETHAMINE 30 MG/ML IJ SOLN
INTRAMUSCULAR | Status: AC
  Filled 2019-02-23: qty 1

## 2019-02-23 MED ORDER — BUPIVACAINE HCL 0.5 % IJ SOLN
INTRAMUSCULAR | Status: DC | PRN
  Administered 2019-02-23: 13 mL

## 2019-02-23 MED ORDER — MIDAZOLAM HCL 2 MG/2ML IJ SOLN
INTRAMUSCULAR | Status: AC
  Filled 2019-02-23: qty 2

## 2019-02-23 MED ORDER — LIDOCAINE HCL (PF) 2 % IJ SOLN
INTRAMUSCULAR | Status: AC
  Filled 2019-02-23: qty 10

## 2019-02-23 MED ORDER — ONDANSETRON HCL 4 MG/2ML IJ SOLN: 4.0000 mg | Freq: Once | INTRAMUSCULAR | Status: DC | PRN

## 2019-02-23 MED ORDER — HYDROMORPHONE HCL 1 MG/ML IJ SOLN
INTRAMUSCULAR | Status: AC
  Administered 2019-02-23: 0.25 mg via INTRAVENOUS
  Filled 2019-02-23: qty 1

## 2019-02-23 MED ORDER — PHENYLEPHRINE HCL (PRESSORS) 10 MG/ML IV SOLN
INTRAVENOUS | Status: DC | PRN
  Administered 2019-02-23: 100 ug via INTRAVENOUS

## 2019-02-23 SURGICAL SUPPLY — 41 items
ANCHOR TIS RET SYS 235ML (MISCELLANEOUS) ×3 IMPLANT
BAG URINE DRAINAGE (UROLOGICAL SUPPLIES) ×3 IMPLANT
BLADE SURG SZ11 CARB STEEL (BLADE) ×3 IMPLANT
CATH FOL 2WAY LX 16X5 (CATHETERS) ×3 IMPLANT
CHLORAPREP W/TINT 26 (MISCELLANEOUS) ×3 IMPLANT
COVER LIGHT HANDLE STERIS (MISCELLANEOUS) ×3 IMPLANT
COVER WAND RF STERILE (DRAPES) ×3 IMPLANT
DERMABOND ADVANCED (GAUZE/BANDAGES/DRESSINGS) ×2
DERMABOND ADVANCED .7 DNX12 (GAUZE/BANDAGES/DRESSINGS) ×1 IMPLANT
DRSG TEGADERM 2-3/8X2-3/4 SM (GAUZE/BANDAGES/DRESSINGS) ×9 IMPLANT
ELECT REM PT RETURN 9FT ADLT (ELECTROSURGICAL) ×3
ELECTRODE REM PT RTRN 9FT ADLT (ELECTROSURGICAL) ×1 IMPLANT
GLOVE BIO SURGEON STRL SZ7 (GLOVE) ×9 IMPLANT
GLOVE INDICATOR 7.5 STRL GRN (GLOVE) ×9 IMPLANT
GOWN STRL REUS W/ TWL LRG LVL3 (GOWN DISPOSABLE) ×3 IMPLANT
GOWN STRL REUS W/TWL LRG LVL3 (GOWN DISPOSABLE) ×6
IRRIGATION STRYKERFLOW (MISCELLANEOUS) ×1 IMPLANT
IRRIGATOR STRYKERFLOW (MISCELLANEOUS) ×3
IV NS 1000ML (IV SOLUTION) ×2
IV NS 1000ML BAXH (IV SOLUTION) ×1 IMPLANT
KIT PINK PAD W/HEAD ARE REST (MISCELLANEOUS) ×3
KIT PINK PAD W/HEAD ARM REST (MISCELLANEOUS) ×1 IMPLANT
KIT TURNOVER CYSTO (KITS) ×3 IMPLANT
L-HOOK LAP DISP 36CM (ELECTROSURGICAL) ×3
LABEL OR SOLS (LABEL) ×3 IMPLANT
LHOOK LAP DISP 36CM (ELECTROSURGICAL) ×1 IMPLANT
NS IRRIG 500ML POUR BTL (IV SOLUTION) ×3 IMPLANT
PACK GYN LAPAROSCOPIC (MISCELLANEOUS) ×3 IMPLANT
PAD OB MATERNITY 4.3X12.25 (PERSONAL CARE ITEMS) ×3 IMPLANT
PAD PREP 24X41 OB/GYN DISP (PERSONAL CARE ITEMS) ×3 IMPLANT
PENCIL ELECTRO HAND CTR (MISCELLANEOUS) ×3 IMPLANT
SET TUBE SMOKE EVAC HIGH FLOW (TUBING) ×3 IMPLANT
SHEARS HARMONIC ACE PLUS 36CM (ENDOMECHANICALS) ×3 IMPLANT
SLEEVE ENDOPATH XCEL 5M (ENDOMECHANICALS) ×3 IMPLANT
SPONGE GAUZE 2X2 8PLY STER LF (GAUZE/BANDAGES/DRESSINGS) ×1
SPONGE GAUZE 2X2 8PLY STRL LF (GAUZE/BANDAGES/DRESSINGS) ×2 IMPLANT
SUT MNCRL AB 4-0 PS2 18 (SUTURE) ×3 IMPLANT
SUT VIC AB 0 CT1 36 (SUTURE) ×3 IMPLANT
SYR 10ML LL (SYRINGE) ×3 IMPLANT
TROCAR ENDO BLADELESS 11MM (ENDOMECHANICALS) ×3 IMPLANT
TROCAR XCEL NON-BLD 5MMX100MML (ENDOMECHANICALS) ×3 IMPLANT

## 2019-02-23 NOTE — Anesthesia Procedure Notes (Signed)
Procedure Name: Intubation Date/Time: 02/23/2019 9:53 AM Performed by: Hedda Slade, CRNA Pre-anesthesia Checklist: Patient identified, Patient being monitored, Timeout performed, Emergency Drugs available and Suction available Patient Re-evaluated:Patient Re-evaluated prior to induction Oxygen Delivery Method: Circle system utilized Preoxygenation: Pre-oxygenation with 100% oxygen Induction Type: IV induction Ventilation: Mask ventilation without difficulty Laryngoscope Size: Mac and 3 Grade View: Grade I Tube type: Oral Tube size: 7.0 mm Number of attempts: 1 Airway Equipment and Method: Stylet Placement Confirmation: ETT inserted through vocal cords under direct vision,  positive ETCO2 and breath sounds checked- equal and bilateral Secured at: 21 cm Tube secured with: Tape Dental Injury: Teeth and Oropharynx as per pre-operative assessment

## 2019-02-23 NOTE — Op Note (Signed)
  Operative Note   02/23/2019  PRE-OP DIAGNOSIS: Right dermoid ovarian cyst  POST-OP DIAGNOSIS: same   PROCEDURE: Procedure(s): LAPAROSCOPIC RIGHT OVARIAN CYSTECTOMY   SURGEON: Corissa Oguinn MD  ANESTHESIA: General   ESTIMATED BLOOD LOSS: 25 cc  COMPLICATIONS: None  DISPOSITION: PACU - hemodynamically stable.  CONDITION: stable  FINDINGS: Laparoscopic survey of the abdomen revealed a grossly normal uterus, tubes, ovaries, liver edge, gallbladder edge and appendix, One intra-abdominal adhesion was noted between the omentum and abdominal wall.    PROCEDURE IN DETAIL: The patient was taken to the OR where anesthesia was administed. The patient was positioned in dorsal lithotomy in the [place]. The patient was then examined under anesthesia with the above noted findings. The patient was prepped and draped in the normal sterile fashion and bladder was drained using a foley catheter. Speculum exam normal, uterus sounded to 8 cm and a hulka manipulator was placed for manipulation purposes.  Attention was turned to the patient's abdomen where a 5 mm skin incision was made in the umbilical fold, after injection of local anesthesia.  The 5 mm port was then placed under direct visualization with the operative laparoscope  The above noted findings. Pneumoperitoneum was obtained. Patient positioned into Trendelenburg.  A 5 mm trocar was then placed in the left lower quadrants under direct visualization with the laparoscope. A 10 mm trocar was then placed in the right lower quadrants under direct visualization with the laparoscope. The right ovary was identified. The area of the ovary suspicious for dermoid cyst was incised using the hook cautery. Gentle dissection exposed the dermoid cyst wall. The [place] and atraumatic graspers were used to gently peel the ovarian cyst free of the normal ovarian tissue. Once the cyst was completely removed it was placed into a laparoscopic bag and  removed through the 10 mm incision. The ovary was hemostatic after the cyst removal. No injuries or bleeding was noted. The suction irrigator was used to irrigate the pelvis. The right 10 mm port was removed. The laparoscopic PMI was used to close the rectus fascia of the 10 mm port.   All instruments and ports were then removed from the abdomen after gas was expelled and patient was leveled.   The skin was closed with 4-0 monocryl and skin adhesive. The foley catheter was removed from the bladder. The hulka manipulator was removed from the uterus.  The patient tolerated the procedure well. All counts were correct x 2. The patient was transferred to the recovery room awake, alert and breathing independently.  Adrian Prows MD [facility] 02/23/2019 11:12 AM

## 2019-02-23 NOTE — Anesthesia Preprocedure Evaluation (Signed)
Anesthesia Evaluation  Patient identified by MRN, date of birth, ID band Patient awake    Reviewed: Allergy & Precautions, NPO status , Patient's Chart, lab work & pertinent test results  History of Anesthesia Complications Negative for: history of anesthetic complications  Airway Mallampati: II  TM Distance: >3 FB Neck ROM: Full    Dental no notable dental hx.    Pulmonary neg pulmonary ROS, neg sleep apnea, neg COPD,    breath sounds clear to auscultation- rhonchi (-) wheezing      Cardiovascular Exercise Tolerance: Good hypertension, (-) CAD, (-) Past MI, (-) Cardiac Stents and (-) CABG  Rhythm:Regular Rate:Normal - Systolic murmurs and - Diastolic murmurs    Neuro/Psych  Headaches, PSYCHIATRIC DISORDERS Anxiety Depression    GI/Hepatic Neg liver ROS, GERD  ,  Endo/Other  negative endocrine ROSneg diabetes  Renal/GU negative Renal ROS     Musculoskeletal   Abdominal Gravid abdomen  Peds  Hematology negative hematology ROS (+)   Anesthesia Other Findings Past Medical History: No date: Attention deficit hyperactivity disorder (ADHD) No date: Chronic upper back pain No date: GERD (gastroesophageal reflux disease) No date: Migraine with aura     Comment:  since elementary school   Reproductive/Obstetrics Dermoid cyst                              Lab Results  Component Value Date   WBC 9.1 02/19/2019   HGB 12.2 02/19/2019   HCT 37.5 02/19/2019   MCV 84.1 02/19/2019   PLT 337 02/19/2019    Anesthesia Physical  Anesthesia Plan  ASA: II  Anesthesia Plan: General   Post-op Pain Management:    Induction: Intravenous  PONV Risk Score and Plan: 2 and Ondansetron  Airway Management Planned: Oral ETT  Additional Equipment:   Intra-op Plan:   Post-operative Plan: Extubation in OR  Informed Consent: I have reviewed the patients History and Physical, chart, labs and  discussed the procedure including the risks, benefits and alternatives for the proposed anesthesia with the patient or authorized representative who has indicated his/her understanding and acceptance.     Dental advisory given  Plan Discussed with: CRNA and Anesthesiologist  Anesthesia Plan Comments:         Anesthesia Quick Evaluation

## 2019-02-23 NOTE — Transfer of Care (Signed)
Immediate Anesthesia Transfer of Care Note  Patient: Tiffany Guzman  Procedure(s) Performed: LAPAROSCOPIC OVARIAN CYSTECTOMY (Right )  Patient Location: PACU  Anesthesia Type:General  Level of Consciousness: sedated  Airway & Oxygen Therapy: Patient Spontanous Breathing and Patient connected to face mask oxygen  Post-op Assessment: Report given to RN and Post -op Vital signs reviewed and stable  Post vital signs: Reviewed and stable  Last Vitals:  Vitals Value Taken Time  BP 99/46 02/23/19 1121  Temp 35.9 C 02/23/19 1121  Pulse 83 02/23/19 1121  Resp 14 02/23/19 1121  SpO2 99 % 02/23/19 1121  Vitals shown include unvalidated device data.  Last Pain:  Vitals:   02/23/19 1121  TempSrc:   PainSc: 0-No pain         Complications: No apparent anesthesia complications

## 2019-02-23 NOTE — Discharge Instructions (Addendum)
Diagnostic Laparoscopy, Care After This sheet gives you information about how to care for yourself after your procedure. Your health care provider may also give you more specific instructions. If you have problems or questions, contact your health care provider. What can I expect after the procedure? After the procedure, it is common to have:  Mild discomfort in the abdomen.  Sore throat. Women who have laparoscopy with pelvic examination may have mild cramping and fluid coming from the vagina for a few days after the procedure. Follow these instructions at home: Medicines  Take over-the-counter and prescription medicines only as told by your health care provider.  If you were prescribed an antibiotic medicine, take it as told by your health care provider. Do not stop taking the antibiotic even if you start to feel better. Driving  Do not drive for 24 hours if you were given a medicine to help you relax (sedative) during your procedure.  Do not drive or use heavy machinery while taking prescription pain medicine. Bathing  Do not take baths, swim, or use a hot tub until your health care provider approves. You may take showers. Incision care   Follow instructions from your health care provider about how to take care of your incisions. Make sure you: ? Wash your hands with soap and water before you change your bandage (dressing). If soap and water are not available, use hand sanitizer. ? Change your dressing as told by your health care provider. ? Leave stitches (sutures), skin glue, or adhesive strips in place. These skin closures may need to stay in place for 2 weeks or longer. If adhesive strip edges start to loosen and curl up, you may trim the loose edges. Do not remove adhesive strips completely unless your health care provider tells you to do that.  Check your incision areas every day for signs of infection. Check for: ? Redness, swelling, or pain. ? Fluid or  blood. ? Warmth. ? Pus or a bad smell. Activity  Return to your normal activities as told by your health care provider. Ask your health care provider what activities are safe for you.  Do not lift anything that is heavier than 10 lb (4.5 kg), or the limit that you are told, until your health care provider says that it is safe. General instructions  To prevent or treat constipation while you are taking prescription pain medicine, your health care provider may recommend that you: ? Drink enough fluid to keep your urine pale yellow. ? Take over-the-counter or prescription medicines. ? Eat foods that are high in fiber, such as fresh fruits and vegetables, whole grains, and beans. ? Limit foods that are high in fat and processed sugars, such as fried and sweet foods.  Do not use any products that contain nicotine or tobacco, such as cigarettes and e-cigarettes. If you need help quitting, ask your health care provider.  Keep all follow-up visits as told by your health care provider. This is important. Contact a health care provider if:  You develop shoulder pain.  You feel lightheaded or faint.  You are unable to pass gas or have a bowel movement.  You feel nauseous or you vomit.  You develop a rash.  You have redness, swelling, or pain around any incision.  You have fluid or blood coming from any incision.  Any incision feels warm to the touch.  You have pus or a bad smell coming from any incision.  You have a fever or chills. Get help  right away if:  You have severe pain.  You have vomiting that does not go away.  You have heavy bleeding from the vagina.  Any incision opens.  You have trouble breathing.  You have chest pain. Summary  After the procedure, it is common to have mild discomfort in the abdomen and a sore throat.  Check your incision areas every day for signs of infection.  Return to your normal activities as told by your health care provider. Ask  your health care provider what activities are safe for you. This information is not intended to replace advice given to you by your health care provider. Make sure you discuss any questions you have with your health care provider. Document Released: 06/19/2015 Document Revised: 06/20/2017 Document Reviewed: 01/01/2017 Elsevier Patient Education  2020 [place]   1) The drugs that you were given will stay in your system until tomorrow so for the next 24 hours you should not:  A) Drive an automobile B) Make any legal decisions C) Drink any alcoholic beverage   2) You may resume regular meals tomorrow.  Today it is better to start with liquids and gradually work up to solid foods.  You may eat anything you prefer, but it is better to start with liquids, then soup and crackers, and gradually work up to solid foods.   3) Please notify your doctor immediately if you have any unusual bleeding, trouble breathing, redness and pain at the surgery site, drainage, fever, or pain not relieved by medication.    4) Additional Instructions:        Please contact your physician with any problems or Same Day Surgery at [phone], Monday through Friday 6 am to 4 pm, or [place] at [facility] number at [phone].

## 2019-02-23 NOTE — Interval H&P Note (Signed)
History and Physical Interval Note:  02/23/2019 9:05 AM  Tiffany Guzman  has presented today for surgery, with the diagnosis of Right Dermoid Cyst.  The various methods of treatment have been discussed with the patient and family. After consideration of risks, benefits and other options for treatment, the patient has consented to  Procedure(s): LAPAROSCOPIC OVARIAN CYSTECTOMY (Right) as a surgical intervention.  The patient's history has been reviewed, patient examined, no change in status, stable for surgery.  I have reviewed the patient's chart and labs.  Questions were answered to the patient's satisfaction.     [place]

## 2019-02-23 NOTE — Anesthesia Post-op Follow-up Note (Signed)
Anesthesia QCDR form completed.        

## 2019-02-23 NOTE — Progress Notes (Signed)
Pt shivering despite being warm. Dr. Kayleen Memos notified. Acknowledged. Orders received. See [facility]

## 2019-02-24 NOTE — Anesthesia Postprocedure Evaluation (Signed)
Anesthesia Post Note  Patient: Kasha Lon Dragovich  Procedure(s) Performed: LAPAROSCOPIC OVARIAN CYSTECTOMY (Right )  Patient location during evaluation: PACU Anesthesia Type: General Level of consciousness: awake and alert and oriented Pain management: pain level controlled Vital Signs Assessment: post-procedure vital signs reviewed and stable Respiratory status: spontaneous breathing Cardiovascular status: blood pressure returned to baseline Anesthetic complications: no     Last Vitals:  Vitals:   02/23/19 1316 02/23/19 1355  BP: 132/68 124/78  Pulse: 81 86  Resp: 18 18  Temp:  36.6 C  SpO2: 99% 100%    Last Pain:  Vitals:   02/23/19 1355  TempSrc: Temporal  PainSc: 6                  [place]

## 2019-03-01 LAB — SURGICAL PATHOLOGY

## 2019-03-01 NOTE — Progress Notes (Signed)
Dermoid cyst, note sent to mychart.

## 2019-03-02 ENCOUNTER — Ambulatory Visit: Payer: Medicaid Other | Admitting: Obstetrics and Gynecology

## 2019-03-03 ENCOUNTER — Inpatient Hospital Stay: Payer: Medicaid Other | Admitting: Nurse Practitioner

## 2019-03-09 ENCOUNTER — Encounter: Payer: Self-pay | Admitting: Gastroenterology

## 2019-03-15 ENCOUNTER — Encounter: Payer: Self-pay | Admitting: Obstetrics and Gynecology

## 2019-03-15 ENCOUNTER — Ambulatory Visit (INDEPENDENT_AMBULATORY_CARE_PROVIDER_SITE_OTHER): Payer: Medicaid Other | Admitting: Obstetrics and Gynecology

## 2019-03-15 ENCOUNTER — Other Ambulatory Visit: Payer: Self-pay

## 2019-03-15 VITALS — BP 100/52 | Ht 64.5 in | Wt 124.0 lb

## 2019-03-15 DIAGNOSIS — Z9889 Other specified postprocedural states: Secondary | ICD-10-CM

## 2019-03-15 NOTE — Progress Notes (Signed)
  Postoperative Follow-up Patient presents post op from laparoscopy for dermoid cyst, 3 weeks ago.  Subjective: Patient reports marked improvement in her preop symptoms. Eating a regular diet without difficulty. Pain is controlled without any medications.  Activity: normal activities of daily living. Patient reports additional symptom's since surgery of None.  Objective: BP (!) 100/52   Ht 5' 4.5" (1.638 m)   Wt 124 lb (56.2 kg)   LMP 02/13/2019   Breastfeeding No   BMI 20.96 kg/m  Physical Exam Constitutional:      Appearance: She is well-developed.  Genitourinary:     Vagina and uterus normal.     No lesions in the vagina.     No cervical motion tenderness.     No right or left adnexal mass present.  HENT:     Head: Normocephalic and atraumatic.  Neck:     Musculoskeletal: Neck supple.     Thyroid: No thyromegaly.  Cardiovascular:     Rate and Rhythm: Normal rate and regular rhythm.     Heart sounds: Normal heart sounds.  Pulmonary:     Effort: Pulmonary effort is normal.     Breath sounds: Normal breath sounds.  Chest:     Breasts:        Right: No inverted nipple, mass, nipple discharge or skin change.        Left: No inverted nipple, mass, nipple discharge or skin change.  Abdominal:     General: Bowel sounds are normal. There is no distension.     Palpations: Abdomen is soft. There is no mass.    Neurological:     Mental Status: She is alert and oriented to person, place, and time.  Skin:    General: Skin is warm and dry.  Psychiatric:        Behavior: Behavior normal.        Thought Content: Thought content normal.        Judgment: Judgment normal.  Vitals signs reviewed.     Assessment: s/p :  Laparoscopic cystectomy stable  Plan: Patient has done well after surgery with no apparent complications.  I have discussed the post-operative course to date, and the expected progress moving forward.  The patient understands what complications to be concerned  about.  I will see the patient in routine follow up, or sooner if needed.  Would recommend yearly pelvic US to monitor for dermoid cysts since this is her second in a short amount of time and she only has one remaining ovary.     Activity plan: No restriction.  Pelvic rest.   R  03/15/2019, 11:45 AM

## 2019-05-14 ENCOUNTER — Encounter: Payer: Self-pay | Admitting: Family Medicine

## 2019-05-14 ENCOUNTER — Ambulatory Visit (INDEPENDENT_AMBULATORY_CARE_PROVIDER_SITE_OTHER): Payer: Medicaid Other | Admitting: Family Medicine

## 2019-05-14 ENCOUNTER — Other Ambulatory Visit (HOSPITAL_COMMUNITY)
Admission: RE | Admit: 2019-05-14 | Discharge: 2019-05-14 | Disposition: A | Discharge status: Home / Self Care | Payer: Medicaid Other | Source: Ambulatory Visit | Attending: Family Medicine | Admitting: Family Medicine

## 2019-05-14 ENCOUNTER — Other Ambulatory Visit: Payer: Self-pay

## 2019-05-14 VITALS — BP 124/70 | HR 101 | Temp 97.3°F | Resp 16 | Ht 65.0 in | Wt 127.9 lb

## 2019-05-14 DIAGNOSIS — Z0000 Encounter for general adult medical examination without abnormal findings: Secondary | ICD-10-CM | POA: Diagnosis not present

## 2019-05-14 DIAGNOSIS — Z1159 Encounter for screening for other viral diseases: Secondary | ICD-10-CM

## 2019-05-14 DIAGNOSIS — Z113 Encounter for screening for infections with a predominantly sexual mode of transmission: Secondary | ICD-10-CM

## 2019-05-14 DIAGNOSIS — Z01419 Encounter for gynecological examination (general) (routine) without abnormal findings: Secondary | ICD-10-CM | POA: Insufficient documentation

## 2019-05-14 NOTE — Patient Instructions (Addendum)
Lexapro or Buspar for anxiety; also look up citalopram (celexa) [facility] 1p-7:30p Mon-Fri on [address]   Low-FODMAP Eating Plan  FODMAPs (fermentable oligosaccharides, disaccharides, monosaccharides, and polyols) are sugars that are hard for some people to digest. A low-FODMAP eating plan may help some people who have bowel (intestinal) diseases to manage their symptoms. This meal plan can be complicated to follow. Work with a diet and nutrition specialist (dietitian) to make a low-FODMAP eating plan that is right for you. A dietitian can make sure that you get enough nutrition from this diet. What are tips for following this plan? Reading food labels  Check labels for hidden FODMAPs such as: ? High-fructose syrup. ? Honey. ? Agave. ? Natural fruit flavors. ? Onion or garlic powder.  Choose low-FODMAP foods that contain 3-4 grams of fiber per serving.  Check food labels for serving sizes. Eat only one serving at a time to make sure FODMAP levels stay low. Meal planning  Follow a low-FODMAP eating plan for up to 6 weeks, or as told by your health care provider or dietitian.  To follow the eating plan: 1. Eliminate high-FODMAP foods from your diet completely. 2. Gradually reintroduce high-FODMAP foods into your diet one at a time. Most people should wait a few days after introducing one high-FODMAP food before they introduce the next high-FODMAP food. Your dietitian can recommend how quickly you may reintroduce foods. 3. Keep a daily record of what you eat and drink, and make note of any symptoms that you have after eating. 4. Review your daily record with a dietitian regularly. Your dietitian can help you identify which foods you can eat and which foods you should avoid. General tips  Drink enough fluid each day to keep your urine pale yellow.  Avoid processed foods. These often have added sugar and may be high in FODMAPs.  Avoid most dairy products, whole  grains, and sweeteners.  Work with a dietitian to make sure you get enough fiber in your diet. Recommended foods Grains  Gluten-free grains, such as rice, oats, buckwheat, quinoa, corn, polenta, and millet. Gluten-free pasta, bread, or cereal. Rice noodles. Corn tortillas. Vegetables  Eggplant, zucchini, cucumber, peppers, green beans, Brussels sprouts, bean sprouts, lettuce, arugula, kale, Swiss chard, spinach, collard greens, bok choy, summer squash, potato, and tomato. Limited amounts of corn, carrot, and sweet potato. Green parts of scallions. Fruits  Bananas, oranges, lemons, limes, blueberries, raspberries, strawberries, grapes, cantaloupe, honeydew melon, kiwi, papaya, passion fruit, and pineapple. Limited amounts of dried cranberries, banana chips, and shredded coconut. Dairy  Lactose-free milk, yogurt, and kefir. Lactose-free cottage cheese and ice cream. Non-dairy milks, such as almond, coconut, hemp, and rice milk. Yogurts made of non-dairy milks. Limited amounts of goat cheese, brie, mozzarella, parmesan, swiss, and other hard cheeses. Meats and other protein foods  Unseasoned beef, pork, poultry, or fish. Eggs. Berniece Salines. Tofu (firm) and tempeh. Limited amounts of nuts and seeds, such as almonds, walnuts, [place] nuts, pecans, peanuts, pumpkin seeds, chia seeds, and sunflower seeds. Fats and oils  Butter-free spreads. Vegetable oils, such as olive, canola, and sunflower oil. Seasoning and other foods  Artificial sweeteners with names that do not end in "ol" such as aspartame, saccharine, and stevia. Maple syrup, white table sugar, raw sugar, brown sugar, and molasses. Fresh basil, coriander, parsley, rosemary, and thyme. Beverages  Water and mineral water. Sugar-sweetened soft drinks. Small amounts of orange juice or cranberry juice. Black and green tea. Most dry wines. Coffee. This  may not be a complete list of low-FODMAP foods. Talk with your dietitian for more information.  Foods to avoid Grains  Wheat, including kamut, durum, and semolina. Barley and bulgur. Couscous. Wheat-based cereals. Wheat noodles, bread, crackers, and pastries. Vegetables  Chicory root, artichoke, asparagus, cabbage, snow peas, sugar snap peas, mushrooms, and cauliflower. Onions, garlic, leeks, and the white part of scallions. Fruits  Fresh, dried, and juiced forms of apple, pear, watermelon, peach, plum, cherries, apricots, blackberries, boysenberries, figs, nectarines, and mango. Avocado. Dairy  Milk, yogurt, ice cream, and soft cheese. Cream and sour cream. Milk-based sauces. Custard. Meats and other protein foods  Fried or fatty meat. Sausage. Cashews and pistachios. Soybeans, baked beans, black beans, chickpeas, kidney beans, fava beans, navy beans, lentils, and split peas. Seasoning and other foods  Any sugar-free gum or candy. Foods that contain artificial sweeteners such as sorbitol, mannitol, isomalt, or xylitol. Foods that contain honey, high-fructose corn syrup, or agave. Bouillon, vegetable stock, beef stock, and chicken stock. Garlic and onion powder. Condiments made with onion, such as hummus, chutney, pickles, relish, salad dressing, and salsa. Tomato paste. Beverages  Chicory-based drinks. Coffee substitutes. Chamomile tea. Fennel tea. Sweet or fortified wines such as port or sherry. Diet soft drinks made with isomalt, mannitol, maltitol, sorbitol, or xylitol. Apple, pear, and mango juice. Juices with high-fructose corn syrup. This may not be a complete list of high-FODMAP foods. Talk with your dietitian to discuss what dietary choices are best for you.  Summary  A low-FODMAP eating plan is a short-term diet that eliminates FODMAPs from your diet to help ease symptoms of certain bowel diseases.  The eating plan usually lasts up to 6 weeks. After that, high-FODMAP foods are restarted gradually, one at a time, so you can find out which may be causing symptoms.  A  low-FODMAP eating plan can be complicated. It is best to work with a dietitian who has experience with this type of plan. This information is not intended to replace advice given to you by your health care provider. Make sure you discuss any questions you have with your health care provider. Document Released: 03/04/2017 Document Revised: 06/20/2017 Document Reviewed: 03/04/2017 Elsevier Patient Education  2020 [employer], 15(4), [phone]. Retrieved April 27, 2018 from [email or web address].[email or web address]">  Knee Exercises Ask your health care provider which exercises are safe for you. Do exercises exactly as told by your health care provider and adjust them as directed. It is normal to feel mild stretching, pulling, tightness, or discomfort as you do these exercises. Stop right away if you feel sudden pain or your pain gets worse. Do not begin these exercises until told by your health care provider. Stretching and range-of-motion exercises These exercises warm up your muscles and joints and improve the movement and flexibility of your knee. These exercises also help to relieve pain and swelling. Knee extension, prone 1. Lie on your abdomen (prone position) on a bed. 2. Place your left / right knee just beyond the edge of the surface so your knee is not on the bed. You can put a towel under your left / right thigh just above your kneecap for comfort. 3. Relax your leg muscles and allow gravity to straighten your knee (extension). You should feel a stretch behind your left / right knee. 4. Hold this position for __________ seconds. 5. Scoot up so your knee is supported between repetitions. Repeat __________ times. Complete this exercise __________ times a day. Knee flexion, active  1. Lie on your back with both legs straight. If this causes back discomfort, bend your left / right knee so your foot is flat on the floor. 2. Slowly slide your left / right heel back  toward your buttocks. Stop when you feel a gentle stretch in the front of your knee or thigh (flexion). 3. Hold this position for __________ seconds. 4. Slowly slide your left / right heel back to the starting position. Repeat __________ times. Complete this exercise __________ times a day. Quadriceps stretch, prone  1. Lie on your abdomen on a firm surface, such as a bed or padded floor. 2. Bend your left / right knee and hold your ankle. If you cannot reach your ankle or pant leg, loop a belt around your foot and grab the belt instead. 3. Gently pull your heel toward your buttocks. Your knee should not slide out to the side. You should feel a stretch in the front of your thigh and knee (quadriceps). 4. Hold this position for __________ seconds. Repeat __________ times. Complete this exercise __________ times a day. Hamstring, supine 1. Lie on your back (supine position). 2. Loop a belt or towel over the ball of your left / right foot. The ball of your foot is on the walking surface, right under your toes. 3. Straighten your left / right knee and slowly pull on the belt to raise your leg until you feel a gentle stretch behind your knee (hamstring). ? Do not let your knee bend while you do this. ? Keep your other leg flat on the floor. 4. Hold this position for __________ seconds. Repeat __________ times. Complete this exercise __________ times a day. Strengthening exercises These exercises build strength and endurance in your knee. Endurance is the ability to use your muscles for a long time, even after they get tired. Quadriceps, isometric This exercise stretches the muscles in front of your thigh (quadriceps) without moving your knee joint (isometric). 1. Lie on your back with your left / right leg extended and your other knee bent. Put a rolled towel or small pillow under your knee if told by your health care provider. 2. Slowly tense the muscles in the front of your left / right thigh.  You should see your kneecap slide up toward your hip or see increased dimpling just above the knee. This motion will push the back of the knee toward the floor. 3. For __________ seconds, hold the muscle as tight as you can without increasing your pain. 4. Relax the muscles slowly and completely. Repeat __________ times. Complete this exercise __________ times a day. Straight leg raises This exercise stretches the muscles in front of your thigh (quadriceps) and the muscles that move your hips (hip flexors). 1. Lie on your back with your left / right leg extended and your other knee bent. 2. Tense the muscles in the front of your left / right thigh. You should see your kneecap slide up or see increased dimpling just above the knee. Your thigh may even shake a bit. 3. Keep these muscles tight as you raise your leg 4-6 inches (10-15 cm) off the floor. Do not let your knee bend. 4. Hold this position for __________ seconds. 5. Keep these muscles tense as you lower your leg. 6. Relax your muscles slowly and completely after each repetition. Repeat __________ times. Complete this exercise __________ times a day. Hamstring, isometric 1. Lie on your back on a firm surface. 2. Bend your left / right knee  about __________ degrees. 3. Dig your left / right heel into the surface as if you are trying to pull it toward your buttocks. Tighten the muscles in the back of your thighs (hamstring) to "dig" as hard as you can without increasing any pain. 4. Hold this position for __________ seconds. 5. Release the tension gradually and allow your muscles to relax completely for __________ seconds after each repetition. Repeat __________ times. Complete this exercise __________ times a day. Hamstring curls If told by your health care provider, do this exercise while wearing ankle weights. Begin with __________ lb weights. Then increase the weight by 1 lb (0.5 kg) increments. Do not wear ankle weights that are more  than __________ lb. 1. Lie on your abdomen with your legs straight. 2. Bend your left / right knee as far as you can without feeling pain. Keep your hips flat against the floor. 3. Hold this position for __________ seconds. 4. Slowly lower your leg to the starting position. Repeat __________ times. Complete this exercise __________ times a day. Squats This exercise strengthens the muscles in front of your thigh and knee (quadriceps). 1. Stand in front of a table, with your feet and knees pointing straight ahead. You may rest your hands on the table for balance but not for support. 2. Slowly bend your knees and lower your hips like you are going to sit in a chair. ? Keep your weight over your heels, not over your toes. ? Keep your lower legs upright so they are parallel with the table legs. ? Do not let your hips go lower than your knees. ? Do not bend lower than told by your health care provider. ? If your knee pain increases, do not bend as low. 3. Hold the squat position for __________ seconds. 4. Slowly push with your legs to return to standing. Do not use your hands to pull yourself to standing. Repeat __________ times. Complete this exercise __________ times a day. Wall slides This exercise strengthens the muscles in front of your thigh and knee (quadriceps). 1. Lean your back against a smooth wall or door, and walk your feet out 18-24 inches (46-61 cm) from it. 2. Place your feet hip-width apart. 3. Slowly slide down the wall or door until your knees bend __________ degrees. Keep your knees over your heels, not over your toes. Keep your knees in line with your hips. 4. Hold this position for __________ seconds. Repeat __________ times. Complete this exercise __________ times a day. Straight leg raises This exercise strengthens the muscles that rotate the leg at the hip and move it away from your body (hip abductors). 1. Lie on your side with your left / right leg in the top position.  Lie so your head, shoulder, knee, and hip line up. You may bend your bottom knee to help you keep your balance. 2. Roll your hips slightly forward so your hips are stacked directly over each other and your left / right knee is facing forward. 3. Leading with your heel, lift your top leg 4-6 inches (10-15 cm). You should feel the muscles in your outer hip lifting. ? Do not let your foot drift forward. ? Do not let your knee roll toward the ceiling. 4. Hold this position for __________ seconds. 5. Slowly return your leg to the starting position. 6. Let your muscles relax completely after each repetition. Repeat __________ times. Complete this exercise __________ times a day. Straight leg raises This exercise stretches the muscles that  move your hips away from the front of the pelvis (hip extensors). 1. Lie on your abdomen on a firm surface. You can put a pillow under your hips if that is more comfortable. 2. Tense the muscles in your buttocks and lift your left / right leg about 4-6 inches (10-15 cm). Keep your knee straight as you lift your leg. 3. Hold this position for __________ seconds. 4. Slowly lower your leg to the starting position. 5. Let your leg relax completely after each repetition. Repeat __________ times. Complete this exercise __________ times a day. This information is not intended to replace advice given to you by your health care provider. Make sure you discuss any questions you have with your health care provider. Document Released: 05/22/2005 Document Revised: 04/28/2018 Document Reviewed: 04/28/2018 Elsevier Patient Education  2020 [employer].

## 2019-05-14 NOTE — Progress Notes (Signed)
Name: Tiffany Guzman   MRN: [id number]    DOB: Nov 22, 1995   Date:05/14/2019       Progress Note  Subjective  Chief Complaint  Chief Complaint  Patient presents with  . Follow-up    HPI  Patient presents for annual CPE.  Diet: Decreased appetite since her pregnancy (was very sick during her pregnancy and has some new taste aversions) Exercise: Does not exercise; has 63monthold at home.  USPSTF grade A and B recommendations    [facility]  0    No ETOH use  Depression: Phq 9 is  Positive - having some difficulties with parenting and friendships right now.  Discussed medication - we will schedule dedicated follow up to discuss meds in detail.  [facility] 05/14/2019 02/10/2019 06/24/2017 06/24/2016 12/15/2015  Decreased Interest 1 0 0 0 0  Down, Depressed, Hopeless 1 0 1 0 1  PHQ - 2 Score 2 0 1 0 1  Altered sleeping 1 0 - - -  Tired, decreased energy 1 0 - - -  Change in appetite 1 0 - - -  Feeling bad or failure about yourself  1 0 - - -  Trouble concentrating 1 0 - - -  Moving slowly or fidgety/restless 1 0 - - -  Suicidal thoughts 0 0 - - -  PHQ-9 Score 8 0 - - -  Difficult doing work/chores Somewhat difficult Not difficult at all - - -   Hypertension: BP Readings from Last 3 Encounters:  05/14/19 124/70  03/15/19 (!) 100/52  02/23/19 124/78   Obesity: Wt Readings from Last 3 Encounters:  05/14/19 127 lb 14.4 oz (58 kg)  03/15/19 124 lb (56.2 kg)  02/19/19 130 lb (59 kg)   BMI Readings from Last 3 Encounters:  05/14/19 21.28 kg/m  03/15/19 20.96 kg/m  02/19/19 26.26 kg/m     Hep C Screening: Not done previously- declines for now.  STD testing and prevention (HIV/chl/gon/syphilis): HIV negative; needs gc/chlam testing Intimate partner violence: No concerns Sexual History/Pain during Intercourse:  No concerns Menstrual History/LMP/Abnormal Bleeding: No concerns; not on  contraception; she is having regular periods Incontinence Symptoms: No concerns  Breast cancer:  - Last Mammogram: N/A - BRCA gene screening: No family history  Osteoporosis Screening: Discussed weight bearing exercises and getting enough calcium and vitamin D in the diet.  Cervical cancer screening: UTD - due next year.  Skin cancer: No concerning lesion; discussed sunscreen Colorectal cancer: Denies family or personal history of colorectal cancer, no changes in BM's - no blood in stool, dark and tarry stool, mucus in stool.  Does take bentyl for IBS Lung cancer: Smokes marijuana daily. Low Dose CT Chest recommended if Age 23-80years, 30 pack-year currently smoking OR have quit w/in 15years. Patient does not qualify.   ECG: Denies chest pain, shortness of breath, or palpitations.   Advanced Care Planning: A voluntary discussion about advance care planning including the explanation and discussion of advance directives.  Discussed health care proxy and Living will, and the patient was not able to identify a health care proxy.  Patient does not have a living will at present time. If patient does have living will, I have requested they bring this to the clinic to be scanned in to their chart.  Lipids: Not indicated  Glucose: Glucose  Date Value Ref Range Status  06/26/2014 95 65 - 99 mg/dL Final  11/07/2011 74  65 - 99 mg/dL Final   Glucose, Bld  Date Value Ref Range Status  02/17/2019 87 70 - 99 mg/dL Final  02/16/2019 89 70 - 99 mg/dL Final  02/15/2019 119 (H) 70 - 99 mg/dL Final    Patient Active Problem List   Diagnosis Date Noted  . Stomach ache   . Status post cesarean delivery 11/14/2017  . 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 Anxiety 06/24/2016    Past Surgical History:  Procedure Laterality Date  . CESAREAN SECTION N/A 11/14/2017   Procedure: CESAREAN SECTION;  Surgeon: Will Bonnet, MD;  Location: [facility] ORS;  Service: Obstetrics;  Laterality: N/A;  .  ESOPHAGOGASTRODUODENOSCOPY Left 02/17/2019   Procedure: ESOPHAGOGASTRODUODENOSCOPY (EGD);  Surgeon: Virgel Manifold, MD;  Location: [facility] ENDOSCOPY;  Service: Endoscopy;  Laterality: Left;  . INSERTION OF NON VAGINAL CONTRACEPTIVE DEVICE    . LAPAROSCOPIC OVARIAN CYSTECTOMY Right 02/23/2019   Procedure: LAPAROSCOPIC OVARIAN CYSTECTOMY;  Surgeon: Homero Fellers, MD;  Location: [facility] ORS;  Service: Gynecology;  Laterality: Right;  . OOPHORECTOMY Left 10/03/2017   Procedure: OOPHORECTOMY;  Surgeon: Homero Fellers, MD;  Location: [facility] ORS;  Service: Gynecology;  Laterality: Left;  . OVARIAN CYST REMOVAL Left 10/03/2017   Procedure: OVARIAN CYSTECTOMY;  Surgeon: Homero Fellers, MD;  Location: [facility] ORS;  Service: Gynecology;  Laterality: Left;    Family History  Problem Relation Age of Onset  . Hypertension Mother   . Hypothyroidism Mother   . Depression Mother   . Diabetes Maternal Grandmother     Social History   Socioeconomic History  . Marital status: Single    Spouse name: Not on file  . Number of children: Not on file  . Years of education: Not on file  . Highest education level: Not on file  Occupational History  . Not on file  Social Needs  . Financial resource strain: Not on file  . Food insecurity    Worry: Not on file    Inability: Not on file  . Transportation needs    Medical: Not on file    Non-medical: Not on file  Tobacco Use  . Smoking status: Never Smoker  . Smokeless tobacco: Never Used  Substance and Sexual Activity  . Alcohol use: No    Alcohol/week: 0.0 standard drinks  . Drug use: Yes    Frequency: 7.0 times per week    Types: Marijuana  . Sexual activity: Yes    Partners: Male    Birth control/protection: None  Lifestyle  . Physical activity    Days per week: Not on file    Minutes per session: Not on file  . Stress: Not on file  Relationships  . Social Herbalist on phone: Not on file    Gets together: Not on  file    Attends religious service: Not on file    Active member of club or organization: Not on file    Attends meetings of clubs or organizations: Not on file    Relationship status: Not on file  . Intimate partner violence    Fear of current or ex partner: Not on file    Emotionally abused: Not on file    Physically abused: Not on file    Forced sexual activity: Not on file  Other Topics Concern  . Not on file  Social History Narrative  . Not on file     Current Outpatient Medications:  .  dicyclomine (BENTYL) 10 MG  capsule, Take 1 capsule (10 mg total) by mouth 3 (three) times daily before meals., Disp: 90 capsule, Rfl: 0 .  promethazine (PHENERGAN) 12.5 MG tablet, Take 1 tablet (12.5 mg total) by mouth every 6 (six) hours as needed for nausea or vomiting., Disp: 20 tablet, Rfl: 0 .  ibuprofen (ADVIL) 800 MG tablet, Take 1 tablet (800 mg total) by mouth every 8 (eight) hours as needed for cramping. (Patient not taking: Reported on 03/15/2019), Disp: 30 tablet, Rfl: 1  No Known Allergies   ROS  Constitutional: Negative for fever or weight change.  Respiratory: Negative for cough and shortness of breath.   Cardiovascular: Negative for chest pain or palpitations.  Gastrointestinal: Negative for abdominal pain, no bowel changes.  Musculoskeletal: Has ongoing bilateral knee pain; she notes R kneecap has come out of place as a child.  She has been increasing discomfort over the last month or so.  Skin: Negative for rash.  Neurological: Negative for dizziness or headache.  No other specific complaints in a complete review of systems (except as listed in HPI above).   Objective  Vitals:   05/14/19 1253  BP: 124/70  Pulse: (!) 101  Resp: 16  Temp: (!) 97.3 F (36.3 C)  TempSrc: Oral  SpO2: 99%  Weight: 127 lb 14.4 oz (58 kg)  Height: _0  (1.651 m)    Body mass index is 21.28 kg/m.  Physical Exam  Constitutional: Patient appears well-developed and well-nourished. No  distress.  HENT: Head: Normocephalic and atraumatic. Ears: B TMs ok, no erythema or effusion; Nose: Nose normal. Mouth/Throat: Oropharynx is clear and moist. No oropharyngeal exudate.  Eyes: Conjunctivae and EOM are normal. Pupils are equal, round, and reactive to light. No scleral icterus.  Neck: Normal range of motion. Neck supple. No JVD present. No thyromegaly present.  Cardiovascular: Normal rate, regular rhythm and normal heart sounds.  No murmur heard. No BLE edema. Pulmonary/Chest: Effort normal and breath sounds normal. No respiratory distress. Abdominal: Soft. Bowel sounds are normal, no distension. There is no tenderness. no masses Breast: no lumps or masses, no nipple discharge or rashes Musculoskeletal: Normal range of motion, no joint effusions. No gross deformities.  The right patella exhibits ease of lateral and medial mobility; bilateral knees are without crepitus, tenderness, or laxity. Neurological: he is alert and oriented to person, place, and time. No cranial nerve deficit. Coordination, balance, strength, speech and gait are normal.  Skin: Skin is warm and dry. No rash noted. No erythema.  Psychiatric: Patient has a normal mood and affect. behavior is normal. Judgment and thought content normal.  Recent Results (from the past 2160 hour(s))  Lipase, blood     Status: None   Collection Time: 02/13/19  1:41 PM  Result Value Ref Range   Lipase 27 11 - 51 U/L    Comment: Performed at [facility], [address] [place],  [address]  Comprehensive metabolic panel     Status: Abnormal   Collection Time: 02/13/19  1:41 PM  Result Value Ref Range   Sodium 140 135 - 145 mmol/L   Potassium 3.1 (L) 3.5 - 5.1 mmol/L   Chloride 109 98 - 111 mmol/L   CO2 16 (L) 22 - 32 mmol/L   Glucose, Bld 133 (H) 70 - 99 mg/dL   BUN 11 6 - 20 mg/dL   Creatinine, Ser 0.79 0.44 - 1.00 mg/dL   Calcium 9.9 8.9 - 10.3 mg/dL   Total Protein 8.0 6.5 - 8.1 g/dL  Albumin 4.7 3.5 -  5.0 g/dL   AST 29 15 - 41 U/L   ALT 19 0 - 44 U/L   Alkaline Phosphatase 67 38 - 126 U/L   Total Bilirubin 0.7 0.3 - 1.2 mg/dL   GFR calc non Af Amer >60 >60 mL/min   GFR calc Af Amer >60 >60 mL/min   Anion gap 15 5 - 15    Comment: Performed at [facility], [address] [place], [place] [address]  CBC     Status: Abnormal   Collection Time: 02/13/19  1:41 PM  Result Value Ref Range   WBC 24.8 (H) 4.0 - 10.5 K/uL   RBC 4.94 3.87 - 5.11 MIL/uL   Hemoglobin 13.6 12.0 - 15.0 g/dL   HCT 41.3 36.0 - 46.0 %   MCV 83.6 80.0 - 100.0 fL   MCH 27.5 26.0 - 34.0 pg   MCHC 32.9 30.0 - 36.0 g/dL   RDW 13.2 11.5 - 15.5 %   Platelets 399 150 - 400 K/uL   nRBC 0.0 0.0 - 0.2 %    Comment: Performed at [facility], [address] [place], [place] [address]  Urinalysis, Complete w Microscopic     Status: Abnormal   Collection Time: 02/13/19  3:21 PM  Result Value Ref Range   Color, Urine YELLOW (A) YELLOW   APPearance HAZY (A) CLEAR   Specific Gravity, Urine 1.023 1.005 - 1.030   pH 5.0 5.0 - 8.0   Glucose, UA NEGATIVE NEGATIVE mg/dL   Hgb urine dipstick NEGATIVE NEGATIVE   Bilirubin Urine NEGATIVE NEGATIVE   Ketones, ur 20 (A) NEGATIVE mg/dL   Protein, ur 30 (A) NEGATIVE mg/dL   Nitrite NEGATIVE NEGATIVE   Leukocytes,Ua NEGATIVE NEGATIVE   RBC / HPF 0-5 0 - 5 RBC/hpf   WBC, UA 0-5 0 - 5 WBC/hpf   Bacteria, UA NONE SEEN NONE SEEN   Squamous Epithelial / LPF 0-5 0 - 5   Mucus PRESENT     Comment: Performed at [facility], [address] [place], [place] [address]  Urine Drug Screen, Qualitative     Status: Abnormal   Collection Time: 02/13/19  3:21 PM  Result Value Ref Range   Tricyclic, Ur Screen NONE DETECTED NONE DETECTED   Amphetamines, Ur Screen NONE DETECTED NONE DETECTED   MDMA (Ecstasy)Ur Screen NONE DETECTED NONE DETECTED   Cocaine Metabolite,Ur [place] NONE DETECTED NONE DETECTED   Opiate, Ur Screen POSITIVE (A) NONE DETECTED   Phencyclidine  (PCP) Ur S NONE DETECTED NONE DETECTED   Cannabinoid 50 Ng, Ur [place] POSITIVE (A) NONE DETECTED   Barbiturates, Ur Screen NONE DETECTED NONE DETECTED   Benzodiazepine, Ur Scrn NONE DETECTED NONE DETECTED   Methadone Scn, Ur NONE DETECTED NONE DETECTED    Comment: (NOTE) Tricyclics + metabolites, urine    Cutoff 1000 ng/mL Amphetamines + metabolites, urine  Cutoff 1000 ng/mL MDMA (Ecstasy), urine              Cutoff 500 ng/mL Cocaine Metabolite, urine          Cutoff 300 ng/mL Opiate + metabolites, urine        Cutoff 300 ng/mL Phencyclidine (PCP), urine         Cutoff 25 ng/mL Cannabinoid, urine                 Cutoff 50 ng/mL Barbiturates + metabolites, urine  Cutoff 200 ng/mL Benzodiazepine, urine  Cutoff 200 ng/mL Methadone, urine                   Cutoff 300 ng/mL The urine drug screen provides only a preliminary, unconfirmed analytical test result and should not be used for non-medical purposes. Clinical consideration and professional judgment should be applied to any positive drug screen result due to possible interfering substances. A more specific alternate chemical method must be used in order to obtain a confirmed analytical result. Gas chromatography / mass spectrometry (GC/MS) is the preferred confirmat ory method. Performed at [facility], [address] [place], [place] [address]   Pregnancy, urine POC     Status: None   Collection Time: 02/13/19  3:24 PM  Result Value Ref Range   Preg Test, Ur NEGATIVE NEGATIVE    Comment:        THE SENSITIVITY OF THIS METHODOLOGY IS >24 mIU/mL   CBC with Differential     Status: Abnormal   Collection Time: 02/13/19  6:48 PM  Result Value Ref Range   WBC 16.3 (H) 4.0 - 10.5 K/uL   RBC 4.24 3.87 - 5.11 MIL/uL   Hemoglobin 11.8 (L) 12.0 - 15.0 g/dL   HCT 35.9 (L) 36.0 - 46.0 %   MCV 84.7 80.0 - 100.0 fL   MCH 27.8 26.0 - 34.0 pg   MCHC 32.9 30.0 - 36.0 g/dL   RDW 13.2 11.5 - 15.5 %   Platelets 258 150  - 400 K/uL   nRBC 0.0 0.0 - 0.2 %   Neutrophils Relative % 94 %   Neutro Abs 15.3 (H) 1.7 - 7.7 K/uL   Lymphocytes Relative 4 %   Lymphs Abs 0.7 0.7 - 4.0 K/uL   Monocytes Relative 2 %   Monocytes Absolute 0.3 0.1 - 1.0 K/uL   Eosinophils Relative 0 %   Eosinophils Absolute 0.0 0.0 - 0.5 K/uL   Basophils Relative 0 %   Basophils Absolute 0.0 0.0 - 0.1 K/uL   Immature Granulocytes 0 %   Abs Immature Granulocytes 0.06 0.00 - 0.07 K/uL    Comment: Performed at [facility], [address] [place], [place] [address]  Urinalysis, Complete w Microscopic     Status: Abnormal   Collection Time: 02/15/19  9:40 AM  Result Value Ref Range   Color, Urine YELLOW (A) YELLOW   APPearance CLEAR (A) CLEAR   Specific Gravity, Urine 1.023 1.005 - 1.030   pH 8.0 5.0 - 8.0   Glucose, UA NEGATIVE NEGATIVE mg/dL   Hgb urine dipstick NEGATIVE NEGATIVE   Bilirubin Urine NEGATIVE NEGATIVE   Ketones, ur 20 (A) NEGATIVE mg/dL   Protein, ur 30 (A) NEGATIVE mg/dL   Nitrite NEGATIVE NEGATIVE   Leukocytes,Ua NEGATIVE NEGATIVE   RBC / HPF 0-5 0 - 5 RBC/hpf   WBC, UA 0-5 0 - 5 WBC/hpf   Bacteria, UA NONE SEEN NONE SEEN   Squamous Epithelial / LPF 0-5 0 - 5   Mucus PRESENT     Comment: Performed at [facility], [address] [place], [place] [address]  Lipase, blood     Status: None   Collection Time: 02/15/19  9:47 AM  Result Value Ref Range   Lipase 32 11 - 51 U/L    Comment: Performed at [facility], [address], [place], [place] [address]  Comprehensive metabolic panel     Status: Abnormal   Collection Time: 02/15/19  9:47 AM  Result Value Ref Range  Sodium 139 135 - 145 mmol/L   Potassium 3.5 3.5 - 5.1 mmol/L   Chloride 109 98 - 111 mmol/L   CO2 18 (L) 22 - 32 mmol/L   Glucose, Bld 119 (H) 70 - 99 mg/dL   BUN 18 6 - 20 mg/dL   Creatinine, Ser 0.66 0.44 - 1.00 mg/dL   Calcium 9.3 8.9 - 10.3 mg/dL   Total Protein 7.9 6.5 - 8.1 g/dL   Albumin 4.8 3.5 - 5.0  g/dL   AST 24 15 - 41 U/L   ALT 19 0 - 44 U/L   Alkaline Phosphatase 63 38 - 126 U/L   Total Bilirubin 0.5 0.3 - 1.2 mg/dL   GFR calc non Af Amer >60 >60 mL/min   GFR calc Af Amer >60 >60 mL/min   Anion gap 12 5 - 15    Comment: Performed at [facility], [address] [place], [place] [address]  CBC     Status: Abnormal   Collection Time: 02/15/19  9:47 AM  Result Value Ref Range   WBC 13.9 (H) 4.0 - 10.5 K/uL   RBC 4.71 3.87 - 5.11 MIL/uL   Hemoglobin 13.0 12.0 - 15.0 g/dL   HCT 39.6 36.0 - 46.0 %   MCV 84.1 80.0 - 100.0 fL   MCH 27.6 26.0 - 34.0 pg   MCHC 32.8 30.0 - 36.0 g/dL   RDW 13.2 11.5 - 15.5 %   Platelets 357 150 - 400 K/uL   nRBC 0.0 0.0 - 0.2 %    Comment: Performed at [facility], [address], [place], [place] [address]  Urine Drug Screen, Qualitative     Status: Abnormal   Collection Time: 02/15/19 10:36 AM  Result Value Ref Range   Tricyclic, Ur Screen NONE DETECTED NONE DETECTED   Amphetamines, Ur Screen NONE DETECTED NONE DETECTED   MDMA (Ecstasy)Ur Screen NONE DETECTED NONE DETECTED   Cocaine Metabolite,Ur [place] NONE DETECTED NONE DETECTED   Opiate, Ur Screen POSITIVE (A) NONE DETECTED   Phencyclidine (PCP) Ur S NONE DETECTED NONE DETECTED   Cannabinoid 50 Ng, Ur [place] POSITIVE (A) NONE DETECTED   Barbiturates, Ur Screen NONE DETECTED NONE DETECTED   Benzodiazepine, Ur Scrn NONE DETECTED NONE DETECTED   Methadone Scn, Ur NONE DETECTED NONE DETECTED    Comment: (NOTE) Tricyclics + metabolites, urine    Cutoff 1000 ng/mL Amphetamines + metabolites, urine  Cutoff 1000 ng/mL MDMA (Ecstasy), urine              Cutoff 500 ng/mL Cocaine Metabolite, urine          Cutoff 300 ng/mL Opiate + metabolites, urine        Cutoff 300 ng/mL Phencyclidine (PCP), urine         Cutoff 25 ng/mL Cannabinoid, urine                 Cutoff 50 ng/mL Barbiturates + metabolites, urine  Cutoff 200 ng/mL Benzodiazepine, urine              Cutoff 200  ng/mL Methadone, urine                   Cutoff 300 ng/mL The urine drug screen provides only a preliminary, unconfirmed analytical test result and should not be used for non-medical purposes. Clinical consideration and professional judgment should be applied to any positive drug screen result due to possible interfering substances. A more specific alternate chemical method must be used in  order to obtain a confirmed analytical result. Gas chromatography / mass spectrometry (GC/MS) is the preferred confirmat ory method. Performed at [facility], [address] [place], [place] [address]   Pregnancy, urine     Status: None   Collection Time: 02/15/19 11:38 AM  Result Value Ref Range   Preg Test, Ur NEGATIVE NEGATIVE    Comment: Performed at [facility], [address] [place], [place] [address]  SARS Coronavirus 2 (CEPHEID - Performed in [facility] hospital lab), Hosp Order     Status: None   Collection Time: 02/15/19  4:56 PM   Specimen: Nasopharyngeal  Result Value Ref Range   SARS Coronavirus 2 NEGATIVE NEGATIVE    Comment: (NOTE) If result is NEGATIVE SARS-CoV-2 target nucleic acids are NOT DETECTED. The SARS-CoV-2 RNA is generally detectable in upper and lower  respiratory specimens during the acute phase of infection. The lowest  concentration of SARS-CoV-2 viral copies this assay can detect is 250  copies / mL. A negative result does not preclude SARS-CoV-2 infection  and should not be used as the sole basis for treatment or other  patient management decisions.  A negative result may occur with  improper specimen collection / handling, submission of specimen other  than nasopharyngeal swab, presence of viral mutation(s) within the  areas targeted by this assay, and inadequate number of viral copies  (<250 copies / mL). A negative result must be combined with clinical  observations, patient history, and epidemiological information. If result is  POSITIVE SARS-CoV-2 target nucleic acids are DETECTED. The SARS-CoV-2 RNA is generally detectable in upper and lower  respiratory specimens dur ing the acute phase of infection.  Positive  results are indicative of active infection with SARS-CoV-2.  Clinical  correlation with patient history and other diagnostic information is  necessary to determine patient infection status.  Positive results do  not rule out bacterial infection or co-infection with other viruses. If result is PRESUMPTIVE POSTIVE SARS-CoV-2 nucleic acids MAY BE PRESENT.   A presumptive positive result was obtained on the submitted specimen  and confirmed on repeat testing.  While 2019 novel coronavirus  (SARS-CoV-2) nucleic acids may be present in the submitted sample  additional confirmatory testing may be necessary for epidemiological  and / or clinical management purposes  to differentiate between  SARS-CoV-2 and other Sarbecovirus currently known to infect humans.  If clinically indicated additional testing with an alternate test  methodology [phone]) is advised. The SARS-CoV-2 RNA is generally  detectable in upper and lower respiratory sp ecimens during the acute  phase of infection. The expected result is Negative. Fact Sheet for Patients:  StrictlyIdeas.no Fact Sheet for Healthcare Providers: BankingDealers.co.za This test is not yet approved or cleared by the [place] FDA and has been authorized for detection and/or diagnosis of SARS-CoV-2 by FDA under an Emergency Use Authorization (EUA).  This EUA will remain in effect (meaning this test can be used) for the duration of the COVID-19 declaration under Section 564(b)(1) of the Act, 21 U.S.C. section 360bbb-3(b)(1), unless the authorization is terminated or revoked sooner. Performed at [facility], [address] [place], [place] [address]   MRSA PCR Screening     Status: None   Collection  Time: 02/15/19  6:28 PM   Specimen: Nasopharyngeal  Result Value Ref Range   MRSA by PCR NEGATIVE NEGATIVE    Comment:        The GeneXpert MRSA Assay (FDA approved for NASAL specimens only),  is one component of a comprehensive MRSA colonization surveillance program. It is not intended to diagnose MRSA infection nor to guide or monitor treatment for MRSA infections. Performed at [facility], [address] [place], [place] [address]   HIV antibody (Routine Testing)     Status: None   Collection Time: 02/16/19  5:29 AM  Result Value Ref Range   HIV Screen 4th Generation wRfx Non Reactive Non Reactive    Comment: (NOTE) Performed At: [facility] [place], [place] [id number] Rush Farmer MD [id number]   Basic metabolic panel     Status: Abnormal   Collection Time: 02/16/19  5:29 AM  Result Value Ref Range   Sodium 140 135 - 145 mmol/L   Potassium 3.7 3.5 - 5.1 mmol/L   Chloride 112 (H) 98 - 111 mmol/L   CO2 22 22 - 32 mmol/L   Glucose, Bld 89 70 - 99 mg/dL   BUN 11 6 - 20 mg/dL   Creatinine, Ser 0.67 0.44 - 1.00 mg/dL   Calcium 8.2 (L) 8.9 - 10.3 mg/dL   GFR calc non Af Amer >60 >60 mL/min   GFR calc Af Amer >60 >60 mL/min   Anion gap 6 5 - 15    Comment: Performed at [facility], [address] [place], [place] [address]  CBC     Status: Abnormal   Collection Time: 02/16/19  5:29 AM  Result Value Ref Range   WBC 9.8 4.0 - 10.5 K/uL   RBC 3.74 (L) 3.87 - 5.11 MIL/uL   Hemoglobin 10.3 (L) 12.0 - 15.0 g/dL   HCT 32.3 (L) 36.0 - 46.0 %   MCV 86.4 80.0 - 100.0 fL   MCH 27.5 26.0 - 34.0 pg   MCHC 31.9 30.0 - 36.0 g/dL   RDW 13.3 11.5 - 15.5 %   Platelets 253 150 - 400 K/uL   nRBC 0.0 0.0 - 0.2 %    Comment: Performed at [facility], [address] [place], [place] [address]  Tissue transglutaminase, IgA     Status: None   Collection Time: 02/16/19  3:53 PM  Result Value Ref Range   Tissue Transglutaminase  Ab, IgA <2 0 - 3 U/mL    Comment: (NOTE)                              Negative        0 -  3                              Weak Positive   4 - 10                              Positive           >10 Tissue Transglutaminase (tTG) has been identified as the endomysial antigen.  Studies have demonstr- ated that endomysial IgA antibodies have over 99% specificity for gluten sensitive enteropathy. Performed At: [facility] [place], [place] [id number] Rush Farmer MD FG:[id number]   Basic metabolic panel     Status: Abnormal   Collection Time: 02/17/19  6:40 AM  Result Value Ref Range   Sodium 139 135 - 145 mmol/L   Potassium 4.0 3.5 - 5.1 mmol/L   Chloride 108 98 - 111 mmol/L  CO2 24 22 - 32 mmol/L   Glucose, Bld 87 70 - 99 mg/dL   BUN 8 6 - 20 mg/dL   Creatinine, Ser 0.66 0.44 - 1.00 mg/dL   Calcium 8.6 (L) 8.9 - 10.3 mg/dL   GFR calc non Af Amer >60 >60 mL/min   GFR calc Af Amer >60 >60 mL/min   Anion gap 7 5 - 15    Comment: Performed at [facility], [address], [place], [place] [address]  Surgical pathology     Status: None   Collection Time: 02/17/19 11:48 AM  Result Value Ref Range   SURGICAL PATHOLOGY      Surgical Pathology CASE: [id number] PATIENT: Willamina Quito Surgical Pathology Report     SPECIMEN SUBMITTED: A. Stomach, random, r/o h pylori; cbx  CLINICAL HISTORY: None provided  PRE-OPERATIVE DIAGNOSIS: N/V, abdominal pain  POST-OPERATIVE DIAGNOSIS: Normal upper endoscopy     DIAGNOSIS: A.  STOMACH, RANDOM; COLD BIOPSY: - UNREMARKABLE GASTRIC ANTRAL AND OXYNTIC MUCOSA. - NEGATIVE FOR ACTIVE INFLAMMATION AND H. PYLORI. - NEGATIVE FOR INTESTINAL METAPLASIA, DYSPLASIA, AND MALIGNANCY.  GROSS DESCRIPTION: A. Labeled: C BX random gastric rule out H. pylori Received: Formalin Tissue fragment(s):  Multiple Size: Aggregate, 0.4 x 0.3 x 0.1 cm Description: Tan soft tissue fragments Entirely submitted in 1  cassette.   Final Diagnosis performed by Betsy Pries, MD.   Electronically signed 02/18/2019 10:31:13AM The electronic signature indicates that the named Attending Pathologist has evaluated the specimen  Technical component performed at [facility], [address] [place], [place] [address] Lab: [phone] Dir: Rush Farmer, MD, MMM  Professional component performed at [facility], [place], [place], [place] [address] Lab: [phone] Dir: Dellia Nims. Rubinas, MD   CBC     Status: None   Collection Time: 02/19/19 12:40 PM  Result Value Ref Range   WBC 9.1 4.0 - 10.5 K/uL   RBC 4.46 3.87 - 5.11 MIL/uL   Hemoglobin 12.2 12.0 - 15.0 g/dL   HCT 37.5 36.0 - 46.0 %   MCV 84.1 80.0 - 100.0 fL   MCH 27.4 26.0 - 34.0 pg   MCHC 32.5 30.0 - 36.0 g/dL   RDW 13.0 11.5 - 15.5 %   Platelets 337 150 - 400 K/uL   nRBC 0.0 0.0 - 0.2 %    Comment: Performed at [facility], [address] [place], [place] [address]  Type and screen [place]     Status: None   Collection Time: 02/19/19 12:40 PM  Result Value Ref Range   ABO/RH(D) B POS    Antibody Screen NEG    Sample Expiration 03/05/2019,2359    Extend sample reason      NO TRANSFUSIONS OR PREGNANCY IN THE PAST 3 MONTHS Performed at [facility], [address] [place], [place] [address]   SARS CORONAVIRUS 2 Nasal Swab     Status: None   Collection Time: 02/19/19 12:50 PM   Specimen: Nasal Swab  Result Value Ref Range   SARS Coronavirus 2 NEGATIVE NEGATIVE    Comment: (NOTE) SARS-CoV-2 target nucleic acids are NOT DETECTED. The SARS-CoV-2 RNA is generally detectable in upper and lower respiratory specimens during the acute phase of infection. Negative results do not preclude SARS-CoV-2 infection, do not rule out co-infections with other pathogens, and should not be used as the sole basis for treatment or other patient management decisions. Negative results  must be combined with clinical observations, patient history, and  epidemiological information. The expected result is Negative. Fact Sheet for Patients: SugarRoll.be Fact Sheet for Healthcare Providers: https://www.woods-mathews.com/ This test is not yet approved or cleared by the [place] FDA and  has been authorized for detection and/or diagnosis of SARS-CoV-2 by FDA under an Emergency Use Authorization (EUA). This EUA will remain  in effect (meaning this test can be used) for the duration of the COVID-19 declaration under Section 56 4(b)(1) of the Act, 21 U.S.C. section 360bbb-3(b)(1), unless the authorization is terminated or revoked sooner. Performed at [facility], [place] [address], [place], [place] [address]   Urine Drug Screen, Qualitative ([place] only)     Status: Abnormal   Collection Time: 02/23/19  7:39 AM  Result Value Ref Range   Tricyclic, Ur Screen NONE DETECTED NONE DETECTED   Amphetamines, Ur Screen NONE DETECTED NONE DETECTED   MDMA (Ecstasy)Ur Screen NONE DETECTED NONE DETECTED   Cocaine Metabolite,Ur [place] NONE DETECTED NONE DETECTED   Opiate, Ur Screen NONE DETECTED NONE DETECTED   Phencyclidine (PCP) Ur S NONE DETECTED NONE DETECTED   Cannabinoid 50 Ng, Ur [place] POSITIVE (A) NONE DETECTED   Barbiturates, Ur Screen NONE DETECTED NONE DETECTED   Benzodiazepine, Ur Scrn NONE DETECTED NONE DETECTED   Methadone Scn, Ur NONE DETECTED NONE DETECTED    Comment: (NOTE) Tricyclics + metabolites, urine    Cutoff 1000 ng/mL Amphetamines + metabolites, urine  Cutoff 1000 ng/mL MDMA (Ecstasy), urine              Cutoff 500 ng/mL Cocaine Metabolite, urine          Cutoff 300 ng/mL Opiate + metabolites, urine        Cutoff 300 ng/mL Phencyclidine (PCP), urine         Cutoff 25 ng/mL Cannabinoid, urine                 Cutoff 50 ng/mL Barbiturates + metabolites, urine  Cutoff 200 ng/mL Benzodiazepine, urine              Cutoff 200  ng/mL Methadone, urine                   Cutoff 300 ng/mL The urine drug screen provides only a preliminary, unconfirmed analytical test result and should not be used for non-medical purposes. Clinical consideration and professional judgment should be applied to any positive drug screen result due to possible interfering substances. A more specific alternate chemical method must be used in order to obtain a confirmed analytical result. Gas chromatography / mass spectrometry (GC/MS) is the preferred confirmat ory method. Performed at [facility], [address] [place], [place] [address]   Pregnancy, urine POC     Status: None   Collection Time: 02/23/19  7:43 AM  Result Value Ref Range   Preg Test, Ur NEGATIVE NEGATIVE    Comment:        THE SENSITIVITY OF THIS METHODOLOGY IS >24 mIU/mL   Surgical pathology     Status: None   Collection Time: 02/23/19 10:17 AM  Result Value Ref Range   SURGICAL PATHOLOGY      Surgical Pathology CASE: [id number] PATIENT: Zareya Resnick Surgical Pathology Report     SPECIMEN SUBMITTED: A. Dermoid cyst, right ovarian  CLINICAL HISTORY: None provided  PRE-OPERATIVE DIAGNOSIS: Right dermoid cyst  POST-OPERATIVE DIAGNOSIS: Same as pre-op     DIAGNOSIS: A.  DERMOID CYST, RIGHT OVARIAN; RIGHT OVARIAN CYSTECTOMY: - MATURE CYSTIC TERATOMA (DERMOID CYST).  Comment: The  majority of the tissue demonstrates cystic spaces lined by keratinized squamous epithelium with associated sebaceous glands.  A focal papillary proliferation is noted and a limited panel of immunohistochemical stains was performed.  The epithelial cells within the papillary proliferation are negative for calretinin, PAX 8, TTF-1, and thyroglobulin.  This pattern of staining supports an area of choroid plexus tissue within a mature cystic teratoma. There is no evidence of malignancy. There was a delay in the sign out of this case so that stains could  be performed.   Calretinin, PAX 8, and TTF-1 IHC slides were prepared by [facility], [place], [place]. Thyroglobulin IHC slides were prepared by [facility], [place], [place]. All controls stained appropriately.  This test was developed and its performance characteristics determined by [facility]. It has not been cleared or approved by the [place] Food and Drug Administration. The FDA does not require this test to go through premarket FDA review. This test is used for clinical purposes. It should not be regarded as investigational or for research. This laboratory is certified under the Clinical Laboratory Improvement Amendments (CLIA) as qualified to perform high complexity clinical laboratory testing.  GROSS DESCRIPTION: A. Labeled: Right ovarian dermoid cyst Received: In formalin Tissue fragment(s): 1 Size: 2.5 x 2.0 x 1.0 cm Description: Cystic mass with smooth and shiny outer surface which is contained great fa tty material admixed with hair.  The cyst wall is 0.1 cm in thickness Entirely submitted in two cassettes.   Final Diagnosis performed by Quay Burow, MD.   Electronically signed 03/01/2019 10:13:22AM The electronic signature indicates that the named Attending Pathologist has evaluated the specimen  Technical component performed at [facility], [address] [place], [place] [address] Lab: [phone] Dir: Rush Farmer, MD, MMM  Professional component performed at [facility], [place], [place], [place] [address] Lab: [phone] Dir: Dellia Nims. Reuel [place], MD      Fall Risk: Fall Risk  05/14/2019 02/10/2019 06/24/2017 06/24/2016 12/15/2015  Falls in the past year? 0 0 No No No  Number falls in past yr: 0 0 - - -  Injury with Fall? 0 0 - - -  Risk for fall due to : - - - - -  Follow up Falls evaluation completed - - - -   Assessment & Plan  1. Well woman exam -USPSTF  grade A and B recommendations reviewed with patient; age-appropriate recommendations, preventive care, screening tests, etc discussed and encouraged; healthy living encouraged; see AVS for patient education given to patient -Discussed importance of 150 minutes of physical activity weekly, eat two servings of fish weekly, eat one serving of tree nuts ( cashews, pistachios, pecans, almonds.Marland Kitchen) every other day, eat 6 servings of fruit/vegetables daily and drink plenty of water and avoid sweet beverages.  - Cervicovaginal ancillary only  2. Routine screening for STI (sexually transmitted infection) - Cervicovaginal ancillary only - Hepatitis C antibody - RPR - HIV Antibody (routine testing w rflx)  3. Need for hepatitis C screening test - Hepatitis C antibody

## 2019-05-18 LAB — CERVICOVAGINAL ANCILLARY ONLY
Chlamydia: NEGATIVE
Comment: NEGATIVE
Comment: NORMAL
Neisseria Gonorrhea: NEGATIVE

## 2019-07-13 ENCOUNTER — Emergency Department
Admission: EM | Admit: 2019-07-13 | Discharge: 2019-07-13 | Disposition: A | Discharge status: Home / Self Care | Payer: Medicaid Other | Attending: Emergency Medicine | Admitting: Emergency Medicine

## 2019-07-13 ENCOUNTER — Other Ambulatory Visit: Payer: Self-pay

## 2019-07-13 DIAGNOSIS — R112 Nausea with vomiting, unspecified: Secondary | ICD-10-CM | POA: Diagnosis not present

## 2019-07-13 DIAGNOSIS — F1210 Cannabis abuse, uncomplicated: Secondary | ICD-10-CM | POA: Insufficient documentation

## 2019-07-13 DIAGNOSIS — R109 Unspecified abdominal pain: Secondary | ICD-10-CM | POA: Diagnosis not present

## 2019-07-13 LAB — CBC
HCT: 42 % (ref 36.0–46.0)
Hemoglobin: 13.9 g/dL (ref 12.0–15.0)
MCH: 27.7 pg (ref 26.0–34.0)
MCHC: 33.1 g/dL (ref 30.0–36.0)
MCV: 83.8 fL (ref 80.0–100.0)
Platelets: 409 10*3/uL — ABNORMAL HIGH (ref 150–400)
RBC: 5.01 MIL/uL (ref 3.87–5.11)
RDW: 13.1 % (ref 11.5–15.5)
WBC: 15 10*3/uL — ABNORMAL HIGH (ref 4.0–10.5)
nRBC: 0 % (ref 0.0–0.2)

## 2019-07-13 LAB — COMPREHENSIVE METABOLIC PANEL
ALT: 18 U/L (ref 0–44)
AST: 26 U/L (ref 15–41)
Albumin: 5.3 g/dL — ABNORMAL HIGH (ref 3.5–5.0)
Alkaline Phosphatase: 70 U/L (ref 38–126)
Anion gap: 14 (ref 5–15)
BUN: 16 mg/dL (ref 6–20)
CO2: 22 mmol/L (ref 22–32)
Calcium: 9.8 mg/dL (ref 8.9–10.3)
Chloride: 104 mmol/L (ref 98–111)
Creatinine, Ser: 0.77 mg/dL (ref 0.44–1.00)
GFR calc Af Amer: >60 mL/min (ref >60)
GFR calc non Af Amer: >60 mL/min (ref >60)
Glucose, Bld: 134 mg/dL — ABNORMAL HIGH (ref 70–99)
Potassium: 3.2 mmol/L — ABNORMAL LOW (ref 3.5–5.1)
Sodium: 140 mmol/L (ref 135–145)
Total Bilirubin: 0.7 mg/dL (ref 0.3–1.2)
Total Protein: 8.7 g/dL — ABNORMAL HIGH (ref 6.5–8.1)

## 2019-07-13 LAB — LIPASE, BLOOD: Lipase: 29 U/L (ref 11–51)

## 2019-07-13 MED ORDER — FENTANYL CITRATE (PF) 100 MCG/2ML IJ SOLN: 50.0000 ug | INTRAMUSCULAR | Status: DC | PRN

## 2019-07-13 MED ORDER — POTASSIUM CHLORIDE CRYS ER 20 MEQ PO TBCR
40.0000 meq | EXTENDED_RELEASE_TABLET | Freq: Once | ORAL | Status: AC
  Administered 2019-07-13: 40 meq via ORAL
  Filled 2019-07-13: qty 2

## 2019-07-13 MED ORDER — POTASSIUM CHLORIDE CRYS ER 20 MEQ PO TBCR: 40.0000 meq | EXTENDED_RELEASE_TABLET | Freq: Once | ORAL | Status: DC

## 2019-07-13 MED ORDER — SODIUM CHLORIDE 0.9 % IV BOLUS
1000.0000 mL | Freq: Once | INTRAVENOUS | Status: AC
  Administered 2019-07-13: 1000 mL via INTRAVENOUS

## 2019-07-13 MED ORDER — DROPERIDOL 2.5 MG/ML IJ SOLN
2.5000 mg | Freq: Once | INTRAMUSCULAR | Status: AC
  Administered 2019-07-13: 2.5 mg via INTRAVENOUS
  Filled 2019-07-13: qty 2

## 2019-07-13 MED ORDER — ONDANSETRON 4 MG PO TBDP
4.0000 mg | ORAL_TABLET | Freq: Once | ORAL | Status: AC | PRN
  Administered 2019-07-13: 4 mg via ORAL

## 2019-07-13 MED ORDER — ONDANSETRON HCL 4 MG/2ML IJ SOLN
4.0000 mg | Freq: Once | INTRAMUSCULAR | Status: AC
  Administered 2019-07-13: 16:00:00 4 mg via INTRAVENOUS
  Filled 2019-07-13: qty 2

## 2019-07-13 MED ORDER — SODIUM CHLORIDE 0.9% FLUSH: 3.0000 mL | Freq: Once | INTRAVENOUS | Status: DC

## 2019-07-13 NOTE — Discharge Instructions (Addendum)
Please stop using marijuana as this is likely the cause of your symptoms. Please seek medical attention for any high fevers, chest pain, shortness of breath, change in behavior, persistent vomiting, bloody stool or any other new or concerning symptoms.

## 2019-07-13 NOTE — ED Triage Notes (Signed)
Pt c/o generalized abd pain with N/V/D for the past 2 hours. Pt is dry heaving in triage.Marland Kitchen

## 2019-07-13 NOTE — ED Provider Notes (Signed)
[facility] Emergency Department Provider Note   ____________________________________________   I have reviewed the triage vital signs and the nursing notes.   HISTORY  Chief Complaint Abdominal Pain   History limited by: Not Limited   HPI Tiffany Guzman is a 23 y.o. female who presents to the emergency department today because of concern for nausea, vomiting and abdominal pain. The patient states that her symptoms started a couple of hours prior to my evaluation. She says she has had similar symptoms in the past. Today's episode reminds her of her previous episodes.  States that she has no idea what could be causing these symptoms. However when I mentioned that she has been told marijuana is likely the cause of her symptoms she states that she does recall being told to stop using marijuana however she has not stopped using marijuana. Last used yesterday. Denies any unusual ingestions. Denies any recent fevers.   Records reviewed. Per medical record review patient has a history of admission and ER visits in the past related to generalized abdominal pain with nausea and vomiting. Per discharge summary dated 02/17/2019 thought secondary to marijuana use.   Past Medical History:  Diagnosis Date  . Anxiety   . Attention deficit hyperactivity disorder (ADHD)   . Chronic upper back pain    lower and upper back pain  . Depression   . Dyspnea   . GERD (gastroesophageal reflux disease)   . Gestational hypertension 11/14/2017  . Migraine with aura    since elementary school    Patient Active Problem List   Diagnosis Date Noted  . Stomach ache   . Status post cesarean delivery 11/14/2017  . 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 Anxiety 06/24/2016    Past Surgical History:  Procedure Laterality Date  . CESAREAN SECTION N/A 11/14/2017   Procedure: CESAREAN SECTION;  Surgeon: Will Bonnet, MD;  Location: [facility] ORS;  Service: Obstetrics;  Laterality:  N/A;  . ESOPHAGOGASTRODUODENOSCOPY Left 02/17/2019   Procedure: ESOPHAGOGASTRODUODENOSCOPY (EGD);  Surgeon: Virgel Manifold, MD;  Location: [facility] ENDOSCOPY;  Service: Endoscopy;  Laterality: Left;  . INSERTION OF NON VAGINAL CONTRACEPTIVE DEVICE    . LAPAROSCOPIC OVARIAN CYSTECTOMY Right 02/23/2019   Procedure: LAPAROSCOPIC OVARIAN CYSTECTOMY;  Surgeon: Homero Fellers, MD;  Location: [facility] ORS;  Service: Gynecology;  Laterality: Right;  . OOPHORECTOMY Left 10/03/2017   Procedure: OOPHORECTOMY;  Surgeon: Homero Fellers, MD;  Location: [facility] ORS;  Service: Gynecology;  Laterality: Left;  . OVARIAN CYST REMOVAL Left 10/03/2017   Procedure: OVARIAN CYSTECTOMY;  Surgeon: Homero Fellers, MD;  Location: [facility] ORS;  Service: Gynecology;  Laterality: Left;    Prior to Admission medications   Medication Sig Start Date End Date Taking? Authorizing Provider  dicyclomine (BENTYL) 10 MG capsule Take 1 capsule (10 mg total) by mouth 3 (three) times daily before meals. 02/17/19   Gladstone Lighter, MD  ibuprofen (ADVIL) 800 MG tablet Take 1 tablet (800 mg total) by mouth every 8 (eight) hours as needed for cramping. Patient not taking: Reported on 03/15/2019 02/23/19   Homero Fellers, MD  promethazine (PHENERGAN) 12.5 MG tablet Take 1 tablet (12.5 mg total) by mouth every 6 (six) hours as needed for nausea or vomiting. 02/13/19   Cuthriell, Charline Bills, PA-C    Allergies Patient has no known allergies.  Family History  Problem Relation Age of Onset  . Hypertension Mother   . Hypothyroidism Mother   . Depression Mother   .  Diabetes Maternal Grandmother     Social History Social History   Tobacco Use  . Smoking status: Never Smoker  . Smokeless tobacco: Never Used  Substance Use Topics  . Alcohol use: No    Alcohol/week: 0.0 standard drinks  . Drug use: Yes    Frequency: 7.0 times per week    Types: Marijuana    Review of Systems Constitutional: No fever/chills Eyes:  No visual changes. ENT: No sore throat. Cardiovascular: Denies chest pain. Respiratory: Denies shortness of breath. Gastrointestinal: Positive for abdominal pain, nausea and vomiting. Genitourinary: Negative for dysuria. Musculoskeletal: Negative for back pain. Skin: Negative for rash. Neurological: Negative for headaches, focal weakness or numbness.  ____________________________________________   PHYSICAL EXAM:  VITAL SIGNS: ED Triage Vitals  Enc Vitals Group     BP 07/13/19 1331 (!) 159/92     Pulse Rate 07/13/19 1331 (!) 105     Resp 07/13/19 1331 20     Temp 07/13/19 1331 97.6 F (36.4 C)     Temp Source 07/13/19 1331 Oral     SpO2 07/13/19 1331 100 %     Weight 07/13/19 1339 120 lb (54.4 kg)     Height 07/13/19 1339 5\' 4"  (1.626 m)     Head Circumference --      Peak Flow --      Pain Score 07/13/19 1339 10   Constitutional: Alert and oriented.  Eyes: Conjunctivae are normal.  ENT      Head: Normocephalic and atraumatic.      Nose: No congestion/rhinnorhea.      Mouth/Throat: Mucous membranes are moist.      Neck: No stridor. Hematological/Lymphatic/Immunilogical: No cervical lymphadenopathy. Cardiovascular: Normal rate, regular rhythm.  No murmurs, rubs, or gallops.  Respiratory: Normal respiratory effort without tachypnea nor retractions. Breath sounds are clear and equal bilaterally. No wheezes/rales/rhonchi. Gastrointestinal: Diffusely tender to palpation.  Genitourinary: Deferred Musculoskeletal: Normal range of motion in all extremities. No lower extremity edema. Neurologic:  Normal speech and language. No gross focal neurologic deficits are appreciated.  Skin:  Skin is warm, dry and intact. No rash noted. Psychiatric: Mood and affect are normal. Speech and behavior are normal. Patient exhibits appropriate insight and judgment.  ____________________________________________    LABS (pertinent positives/negatives)  Lipase 29 CBC wbc 15.0, hgb 13.9, plt  409 CMP na 140, k 3.2, glu 134, cr 0.77  ____________________________________________   EKG  None  ____________________________________________    RADIOLOGY  None  ____________________________________________   PROCEDURES  Procedures  ____________________________________________   INITIAL IMPRESSION / ASSESSMENT AND PLAN / ED COURSE  Pertinent labs & imaging results that were available during my care of the patient were reviewed by me and considered in my medical decision making (see chart for details).   Patient presented to the emergency department today because of concerns for nausea vomiting abdominal pain.  Patient has had similar symptoms in the past.  Was thought to be cannabinoid hyperemesis syndrome.  When asked about this patient states she is still smoking marijuana last smoked yesterday.  Patient's blood work shows a mild leukocytosis.  She has had leukocytosis in the past with her symptoms.  She has had negative CT scans in the past.  I discussed this with the patient.  This point will defer CT scanning.  Patient did feel better after IV fluids and droperidol.  She stated she would like to be discharged home.  Will discharge patient home.  Discussed with patient cessation of marijuana use.  ____________________________________________  FINAL CLINICAL IMPRESSION(S) / ED DIAGNOSES  Final diagnoses:  Nausea and vomiting, intractability of vomiting not specified, unspecified vomiting type     Note: This dictation was prepared with Dragon dictation. Any transcriptional errors that result from this process are unintentional     Nance Pear, MD 07/13/19 1730

## 2019-07-20 ENCOUNTER — Other Ambulatory Visit: Payer: Self-pay

## 2019-07-20 ENCOUNTER — Encounter: Payer: Self-pay | Admitting: Family Medicine

## 2019-07-20 ENCOUNTER — Ambulatory Visit (INDEPENDENT_AMBULATORY_CARE_PROVIDER_SITE_OTHER): Payer: Medicaid Other | Admitting: Family Medicine

## 2019-07-20 DIAGNOSIS — E876 Hypokalemia: Secondary | ICD-10-CM | POA: Diagnosis not present

## 2019-07-20 DIAGNOSIS — F419 Anxiety disorder, unspecified: Secondary | ICD-10-CM

## 2019-07-20 DIAGNOSIS — F321 Major depressive disorder, single episode, moderate: Secondary | ICD-10-CM

## 2019-07-20 DIAGNOSIS — R112 Nausea with vomiting, unspecified: Secondary | ICD-10-CM

## 2019-07-20 DIAGNOSIS — D72829 Elevated white blood cell count, unspecified: Secondary | ICD-10-CM | POA: Diagnosis not present

## 2019-07-20 DIAGNOSIS — F1290 Cannabis use, unspecified, uncomplicated: Secondary | ICD-10-CM

## 2019-07-20 MED ORDER — BUSPIRONE HCL 7.5 MG PO TABS: ORAL_TABLET | ORAL | 1 refills | Status: DC

## 2019-07-20 MED ORDER — PROMETHAZINE HCL 12.5 MG PO TABS: 12.5000 mg | ORAL_TABLET | Freq: Three times a day (TID) | ORAL | 0 refills | Status: DC | PRN

## 2019-07-20 MED ORDER — ESCITALOPRAM OXALATE 10 MG PO TABS: ORAL_TABLET | ORAL | 1 refills | Status: DC

## 2019-07-20 MED ORDER — DICYCLOMINE HCL 10 MG PO CAPS: 10.0000 mg | ORAL_CAPSULE | Freq: Three times a day (TID) | ORAL | 0 refills | Status: DC

## 2019-07-20 NOTE — Progress Notes (Signed)
Name: Tiffany Guzman   MRN: [id number]    DOB: April 05, 1996   Date:07/20/2019       Progress Note  Subjective  Chief Complaint  Chief Complaint  Patient presents with  . Depression  . Anxiety  . Postpartum Care    I connected with  Tiffany Guzman on 07/20/19 at  9:40 AM EST by telephone and verified that I am speaking with the correct person using two identifiers.   I discussed the limitations, risks, security and privacy concerns of performing an evaluation and management service by telephone and the availability of in person appointments. Staff also discussed with the patient that there may be a patient responsible charge related to this service. Patient Location: Home Provider Location: Office Additional Individuals present: None  HPI  Pt presents with concern for depression and anxiety - she states that she has struggled even as early as [employer].  She notes ADHD, took stimulant as a child/teen.  She also notes having had several ER visits for abdominal pain that she thinks has really been related to anxiety and depression.  She states she used to take Paxil many years ago - it did not work well for her.  She is no longer breastfeeding. She denies SI/HI.  Discussed medication options in detail - she is agreeable to start lexapro and buspirone.  She was in ER 07/13/2019 for N/V - potassium was 3.2 - was given PO replacement.  WBC was 15.  We will recheck these labs today.  She states that she has been smoking marijuana daily since age 49, and does agree that she likely suffers from cannabinoid hyperemesis.  She states that she is afraid to stop marijuana use because her anxiety is so severe. Discussed cessation in detail and she is in agreement.  Depression screen [facility] 2/9 07/20/2019 05/14/2019 02/10/2019 06/24/2017 06/24/2016  Decreased Interest 3 1 0 0 0  Down, Depressed, Hopeless 2 1 0 1 0  PHQ - 2 Score 5 2 0 1 0  Altered sleeping 1 1 0 - -  Tired, decreased  energy 1 1 0 - -  Change in appetite 3 1 0 - -  Feeling bad or failure about yourself  2 1 0 - -  Trouble concentrating 3 1 0 - -  Moving slowly or fidgety/restless 1 1 0 - -  Suicidal thoughts 0 0 0 - -  PHQ-9 Score 16 8 0 - -  Difficult doing work/chores Very difficult Somewhat difficult Not difficult at all - -    GAD 7 : Generalized Anxiety Score 07/20/2019  Nervous, Anxious, on Edge 3  Control/stop worrying 3  Worry too much - different things 3  Trouble relaxing 3  Restless 3  Easily annoyed or irritable 3  Afraid - awful might happen 3  Total GAD 7 Score 21  Anxiety Difficulty Extremely difficult      Patient Active Problem List   Diagnosis Date Noted  . Stomach ache   . Status post cesarean delivery 11/14/2017  . 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 Anxiety 06/24/2016    Social History   Tobacco Use  . Smoking status: Never Smoker  . Smokeless tobacco: Never Used  Substance Use Topics  . Alcohol use: No    Alcohol/week: 0.0 standard drinks     Current Outpatient Medications:  .  dicyclomine (BENTYL) 10 MG capsule, Take 1 capsule (10 mg total) by mouth 3 (three) times daily before meals., Disp: 90  capsule, Rfl: 0 .  ibuprofen (ADVIL) 800 MG tablet, Take 1 tablet (800 mg total) by mouth every 8 (eight) hours as needed for cramping. (Patient not taking: Reported on 03/15/2019), Disp: 30 tablet, Rfl: 1 .  promethazine (PHENERGAN) 12.5 MG tablet, Take 1 tablet (12.5 mg total) by mouth every 6 (six) hours as needed for nausea or vomiting. (Patient not taking: Reported on 07/20/2019), Disp: 20 tablet, Rfl: 0  No Known Allergies  I personally reviewed active problem list, medication list, allergies, health maintenance, notes from last encounter, lab results with the patient/caregiver today.  ROS  Ten systems reviewed and is negative except as mentioned in HPI  Objective  Virtual encounter, vitals not obtained.  There is no height or weight on file to  calculate BMI.  Nursing Note and Vital Signs reviewed.  Physical Exam  Constitutional: Patient appears well-developed and well-nourished. No distress.  HENT: Head: Normocephalic and atraumatic.  Neck: Normal range of motion. Pulmonary/Chest: Effort normal. No respiratory distress. Speaking in complete sentences Neurological: Pt is alert and oriented to person, place, and time. Coordination, speech and gait are normal.  Psychiatric: Patient has a anxious mood and affect. behavior is appropriate. Judgment and thought content normal.  No results found for this or any previous visit (from the past 72 hour(s)).  Assessment & Plan  1. Anxiety - CBC with Differential/Platelet - COMPLETE METABOLIC PANEL WITH GFR - TSH - busPIRone (BUSPAR) 7.5 MG tablet; Take 1 tablet once daily for 7 days, then increase to 1 tablet twice daily  Dispense: 60 tablet; Refill: 1 - escitalopram (LEXAPRO) 10 MG tablet; Take 1/2 tablet once daily for 7 days, then increase to 1 tablet once daily.  Dispense: 30 tablet; Refill: 1 - Ambulatory referral to Psychiatry  2. Current moderate episode of major depressive disorder without prior episode (HCC) - CBC with Differential/Platelet - COMPLETE METABOLIC PANEL WITH GFR - TSH - busPIRone (BUSPAR) 7.5 MG tablet; Take 1 tablet once daily for 7 days, then increase to 1 tablet twice daily  Dispense: 60 tablet; Refill: 1 - escitalopram (LEXAPRO) 10 MG tablet; Take 1/2 tablet once daily for 7 days, then increase to 1 tablet once daily.  Dispense: 30 tablet; Refill: 1 - Ambulatory referral to Psychiatry  3. Hypokalemia - COMPLETE METABOLIC PANEL WITH GFR  4. Leukocytosis, unspecified type - CBC with Differential/Platelet  5. Non-intractable vomiting with nausea, unspecified vomiting type - promethazine (PHENERGAN) 12.5 MG tablet; Take 1 tablet (12.5 mg total) by mouth every 8 (eight) hours as needed for nausea or vomiting.  Dispense: 10 tablet; Refill: 0  6. Marijuana  use, continuous - Discussed cessation in detail - she is very agreeable.   - I discussed the assessment and treatment plan with the patient. The patient was provided an opportunity to ask questions and all were answered. The patient agreed with the plan and demonstrated an understanding of the instructions.  - The patient was advised to call back or seek an in-person evaluation if the symptoms worsen or if the condition fails to improve as anticipated.  I provided 21 minutes of non-face-to-face time during this encounter.  Hubbard Hartshorn, FNP

## 2019-07-20 NOTE — Patient Instructions (Signed)
Psychologytoday[employer]com Therapist finder [facility]  Here are some resources to help you if you feel you are in a mental health crisis:  [place] - Call [phone]  for help - Website with more resources: GripTrip.com.pt  [employer] - Call [phone] for help. - Mobile Crisis Program available 24 hours a day, 365 days a year. - Available for anyone of any age in [place] counties.  [facility] - Address: [address], [place] - Telephone: [phone]  - Hours of Operation: Sunday - Saturday - 8:00 a.m. - 8:00 p.m. - Medicaid, Medicare (Government Issued Only), BCBS, and [place] [facility], [place], Psychiatrists on-site to provide medication management, [place], and Peer Support Care.  National Mobile Crisis: [phone] - [facility] available 24 hours a day, 365 days a year. - Available for anyone of any age in [place]

## 2019-07-31 DIAGNOSIS — R112 Nausea with vomiting, unspecified: Secondary | ICD-10-CM | POA: Diagnosis not present

## 2019-07-31 DIAGNOSIS — R1084 Generalized abdominal pain: Secondary | ICD-10-CM | POA: Diagnosis not present

## 2019-07-31 DIAGNOSIS — F909 Attention-deficit hyperactivity disorder, unspecified type: Secondary | ICD-10-CM | POA: Diagnosis not present

## 2019-08-01 ENCOUNTER — Other Ambulatory Visit: Payer: Self-pay

## 2019-08-01 ENCOUNTER — Emergency Department
Admission: EM | Admit: 2019-08-01 | Discharge: 2019-08-01 | Disposition: A | Discharge status: Home / Self Care | Payer: Medicaid Other | Attending: Emergency Medicine | Admitting: Emergency Medicine

## 2019-08-01 ENCOUNTER — Encounter: Payer: Self-pay | Admitting: Emergency Medicine

## 2019-08-01 DIAGNOSIS — G2402 Drug induced acute dystonia: Secondary | ICD-10-CM | POA: Insufficient documentation

## 2019-08-01 DIAGNOSIS — R000 Tachycardia, unspecified: Secondary | ICD-10-CM | POA: Diagnosis not present

## 2019-08-01 DIAGNOSIS — Z79899 Other long term (current) drug therapy: Secondary | ICD-10-CM | POA: Insufficient documentation

## 2019-08-01 DIAGNOSIS — M62838 Other muscle spasm: Secondary | ICD-10-CM | POA: Diagnosis present

## 2019-08-01 LAB — BASIC METABOLIC PANEL
Anion gap: 10 (ref 5–15)
BUN: 14 mg/dL (ref 6–20)
CO2: 21 mmol/L — ABNORMAL LOW (ref 22–32)
Calcium: 9.2 mg/dL (ref 8.9–10.3)
Chloride: 105 mmol/L (ref 98–111)
Creatinine, Ser: 0.79 mg/dL (ref 0.44–1.00)
GFR calc Af Amer: >60 mL/min (ref >60)
GFR calc non Af Amer: >60 mL/min (ref >60)
Glucose, Bld: 102 mg/dL — ABNORMAL HIGH (ref 70–99)
Potassium: 4.1 mmol/L (ref 3.5–5.1)
Sodium: 136 mmol/L (ref 135–145)

## 2019-08-01 LAB — CBC WITH DIFFERENTIAL/PLATELET
Abs Immature Granulocytes: 0.06 10*3/uL (ref 0.00–0.07)
Basophils Absolute: 0 10*3/uL (ref 0.0–0.1)
Basophils Relative: 0 %
Eosinophils Absolute: 0 10*3/uL (ref 0.0–0.5)
Eosinophils Relative: 0 %
HCT: 40.1 % (ref 36.0–46.0)
Hemoglobin: 13.1 g/dL (ref 12.0–15.0)
Immature Granulocytes: 0 %
Lymphocytes Relative: 16 %
Lymphs Abs: 2.6 10*3/uL (ref 0.7–4.0)
MCH: 27.8 pg (ref 26.0–34.0)
MCHC: 32.7 g/dL (ref 30.0–36.0)
MCV: 85.1 fL (ref 80.0–100.0)
Monocytes Absolute: 0.9 10*3/uL (ref 0.1–1.0)
Monocytes Relative: 6 %
Neutro Abs: 12.7 10*3/uL — ABNORMAL HIGH (ref 1.7–7.7)
Neutrophils Relative %: 78 %
Platelets: 286 10*3/uL (ref 150–400)
RBC: 4.71 MIL/uL (ref 3.87–5.11)
RDW: 13.6 % (ref 11.5–15.5)
WBC: 16.3 10*3/uL — ABNORMAL HIGH (ref 4.0–10.5)
nRBC: 0 % (ref 0.0–0.2)

## 2019-08-01 MED ORDER — DIPHENHYDRAMINE HCL 25 MG PO CAPS
50.0000 mg | ORAL_CAPSULE | Freq: Once | ORAL | Status: AC
  Administered 2019-08-01: 50 mg via ORAL
  Filled 2019-08-01: qty 2

## 2019-08-01 NOTE — ED Provider Notes (Signed)
[facility] Emergency Department Provider Note   ____________________________________________    I have reviewed the triage vital signs and the nursing notes.   HISTORY  Chief Complaint Spasms     HPI Tiffany Guzman is a 24 y.o. female with history as noted below who presents with complaints of jaw spasming and a feeling of her jaw locking up.  She reports this was critically severe earlier however it has improved greatly over the last hour.  She reports she was recently started on BuSpar and Lexapro about 3 to 4 days ago.  She is never had these symptoms before.  Otherwise feels well with no complaints  Past Medical History:  Diagnosis Date  . Anxiety   . Attention deficit hyperactivity disorder (ADHD)   . Chronic upper back pain    lower and upper back pain  . Depression   . Dyspnea   . GERD (gastroesophageal reflux disease)   . Gestational hypertension 11/14/2017  . Migraine with aura    since elementary school    Patient Active Problem List   Diagnosis Date Noted  . Stomach ache   . Status post cesarean delivery 11/14/2017  . 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 Anxiety 06/24/2016    Past Surgical History:  Procedure Laterality Date  . CESAREAN SECTION N/A 11/14/2017   Procedure: CESAREAN SECTION;  Surgeon: Will Bonnet, MD;  Location: [facility] ORS;  Service: Obstetrics;  Laterality: N/A;  . ESOPHAGOGASTRODUODENOSCOPY Left 02/17/2019   Procedure: ESOPHAGOGASTRODUODENOSCOPY (EGD);  Surgeon: Virgel Manifold, MD;  Location: [facility] ENDOSCOPY;  Service: Endoscopy;  Laterality: Left;  . INSERTION OF NON VAGINAL CONTRACEPTIVE DEVICE    . LAPAROSCOPIC OVARIAN CYSTECTOMY Right 02/23/2019   Procedure: LAPAROSCOPIC OVARIAN CYSTECTOMY;  Surgeon: Homero Fellers, MD;  Location: [facility] ORS;  Service: Gynecology;  Laterality: Right;  . OOPHORECTOMY Left 10/03/2017   Procedure: OOPHORECTOMY;  Surgeon: Homero Fellers, MD;   Location: [facility] ORS;  Service: Gynecology;  Laterality: Left;  . OVARIAN CYST REMOVAL Left 10/03/2017   Procedure: OVARIAN CYSTECTOMY;  Surgeon: Homero Fellers, MD;  Location: [facility] ORS;  Service: Gynecology;  Laterality: Left;    Prior to Admission medications   Medication Sig Start Date End Date Taking? Authorizing Provider  busPIRone (BUSPAR) 7.5 MG tablet Take 1 tablet once daily for 7 days, then increase to 1 tablet twice daily 07/20/19   Hubbard Hartshorn, FNP  dicyclomine (BENTYL) 10 MG capsule Take 1 capsule (10 mg total) by mouth 3 (three) times daily before meals. 07/20/19   Hubbard Hartshorn, FNP  escitalopram (LEXAPRO) 10 MG tablet Take 1/2 tablet once daily for 7 days, then increase to 1 tablet once daily. 07/20/19   Hubbard Hartshorn, FNP  promethazine (PHENERGAN) 12.5 MG tablet Take 1 tablet (12.5 mg total) by mouth every 8 (eight) hours as needed for nausea or vomiting. 07/20/19   Hubbard Hartshorn, FNP     Allergies Patient has no known allergies.  Family History  Problem Relation Age of Onset  . Hypertension Mother   . Hypothyroidism Mother   . Depression Mother   . Diabetes Maternal Grandmother     Social History Social History   Tobacco Use  . Smoking status: Never Smoker  . Smokeless tobacco: Never Used  Substance Use Topics  . Alcohol use: No    Alcohol/week: 0.0 standard drinks  . Drug use: Yes    Frequency: 7.0 times per week    Types:  Marijuana    Review of Systems  Constitutional: No fever/chills Eyes: No visual changes.  ENT: As above Cardiovascular: Denies chest pain. Respiratory: Denies shortness of breath. Gastrointestinal: No abdominal pain.   Genitourinary: Negative for dysuria. Musculoskeletal: Negative for back pain. Skin: Negative for rash. Neurological: Negative for headaches    ____________________________________________   PHYSICAL EXAM:  VITAL SIGNS: ED Triage Vitals  Enc Vitals Group     BP 08/01/19 1527 123/81     Pulse  Rate 08/01/19 1527 (!) 117     Resp 08/01/19 1527 20     Temp 08/01/19 1527 98.4 F (36.9 C)     Temp Source 08/01/19 1527 Oral     SpO2 08/01/19 1527 97 %     Weight 08/01/19 1525 54.4 kg (120 lb)     Height 08/01/19 1525 1.626 m (5\' 4" )     Head Circumference --      Peak Flow --      Pain Score 08/01/19 1525 10     Pain Loc --      Pain Edu? --      Excl. in [place]? --     Constitutional: Alert and oriented.   ENT: No pharyngeal swelling Mouth/Throat: Mucous membranes are moist.  Normal exam at this time  Cardiovascular: Normal rate, regular rhythm. Good peripheral circulation. Respiratory: Normal respiratory effort.  No retractions Gastrointestinal: Soft and nontender. No distention.  No CVA tenderness.  Musculoskeletal: .  Warm and well perfused Neurologic:  Normal speech and language. No gross focal neurologic deficits are appreciated.  Skin:  Skin is warm, dry and intact. No rash noted. Psychiatric: Mood and affect are normal. Speech and behavior are normal.  ____________________________________________   LABS (all labs ordered are listed, but only abnormal results are displayed)  Labs Reviewed  CBC WITH DIFFERENTIAL/PLATELET - Abnormal; Notable for the following components:      Result Value   WBC 16.3 (*)    Neutro Abs 12.7 (*)    All other components within normal limits  BASIC METABOLIC PANEL - Abnormal; Notable for the following components:   CO2 21 (*)    Glucose, Bld 102 (*)    All other components within normal limits   ____________________________________________  EKG  ED ECG REPORT I, Lavonia Drafts, the attending physician, personally viewed and interpreted this ECG.  Date: 08/01/2019  Rhythm: Sinus tachycardia QRS Axis: normal Intervals: normal ST/T Wave abnormalities: normal Narrative Interpretation: no evidence of acute  ischemia  ____________________________________________  RADIOLOGY  None ____________________________________________   PROCEDURES  Procedure(s) performed: No  Procedures   Critical Care performed: No ____________________________________________   INITIAL IMPRESSION / ASSESSMENT AND PLAN / ED COURSE  Pertinent labs & imaging results that were available during my care of the patient were reviewed by me and considered in my medical decision making (see chart for details).  Patient well-appearing in no acute distress at this time, she reports her symptoms abated significantly although she still feels occasional spasms in her jaw bilaterally.  Exam is overall unremarkable.  Strongly suspect dystonic reaction from BuSpar, we will give p.o. Benadryl and reevaluate  Patient with continued improvement after Benadryl, appropriate for discharge with outpatient follow-up recommend discontinuing BuSpar at this time    ____________________________________________   FINAL CLINICAL IMPRESSION(S) / ED DIAGNOSES  Final diagnoses:  Dystonic drug reaction        Note:  This document was prepared using Dragon voice recognition software and may include unintentional dictation errors.   Pam Vanalstine,  Herbie Baltimore, MD 08/01/19 2200

## 2019-08-01 NOTE — ED Triage Notes (Addendum)
FIRST NURSE NOTE- here for spasms to body.  Reports jaw won't move.  No history of injury or bite.  Seen at [facility] yesterday for same.

## 2019-08-01 NOTE — ED Notes (Signed)
Pt reports that her jaw pain is better and that she has pain in her kneecaps and that her legs are "tight & wonky.'

## 2019-08-01 NOTE — ED Triage Notes (Signed)
Pt to ED via POV stating that she is having spasms in her jaw and muscles. Pt states that she just recently started Buspar and lexapro.

## 2019-08-02 ENCOUNTER — Other Ambulatory Visit: Payer: Self-pay

## 2019-08-02 ENCOUNTER — Emergency Department
Admission: EM | Admit: 2019-08-02 | Discharge: 2019-08-02 | Disposition: A | Discharge status: Home / Self Care | Payer: Medicaid Other | Attending: Emergency Medicine | Admitting: Emergency Medicine

## 2019-08-02 ENCOUNTER — Encounter: Payer: Self-pay | Admitting: Emergency Medicine

## 2019-08-02 DIAGNOSIS — R197 Diarrhea, unspecified: Secondary | ICD-10-CM | POA: Diagnosis not present

## 2019-08-02 DIAGNOSIS — Z79899 Other long term (current) drug therapy: Secondary | ICD-10-CM | POA: Insufficient documentation

## 2019-08-02 DIAGNOSIS — R10819 Abdominal tenderness, unspecified site: Secondary | ICD-10-CM | POA: Diagnosis not present

## 2019-08-02 DIAGNOSIS — T407X1A Poisoning by cannabis (derivatives), accidental (unintentional), initial encounter: Secondary | ICD-10-CM | POA: Diagnosis not present

## 2019-08-02 DIAGNOSIS — I499 Cardiac arrhythmia, unspecified: Secondary | ICD-10-CM | POA: Diagnosis not present

## 2019-08-02 DIAGNOSIS — R1084 Generalized abdominal pain: Secondary | ICD-10-CM | POA: Diagnosis not present

## 2019-08-02 DIAGNOSIS — Z20822 Contact with and (suspected) exposure to covid-19: Secondary | ICD-10-CM | POA: Diagnosis not present

## 2019-08-02 DIAGNOSIS — R509 Fever, unspecified: Secondary | ICD-10-CM | POA: Diagnosis not present

## 2019-08-02 DIAGNOSIS — R110 Nausea: Secondary | ICD-10-CM | POA: Diagnosis not present

## 2019-08-02 DIAGNOSIS — R109 Unspecified abdominal pain: Secondary | ICD-10-CM | POA: Diagnosis not present

## 2019-08-02 DIAGNOSIS — R1111 Vomiting without nausea: Secondary | ICD-10-CM | POA: Diagnosis not present

## 2019-08-02 DIAGNOSIS — F1290 Cannabis use, unspecified, uncomplicated: Secondary | ICD-10-CM

## 2019-08-02 DIAGNOSIS — R112 Nausea with vomiting, unspecified: Secondary | ICD-10-CM

## 2019-08-02 DIAGNOSIS — F909 Attention-deficit hyperactivity disorder, unspecified type: Secondary | ICD-10-CM | POA: Diagnosis not present

## 2019-08-02 DIAGNOSIS — R52 Pain, unspecified: Secondary | ICD-10-CM | POA: Diagnosis not present

## 2019-08-02 LAB — COMPREHENSIVE METABOLIC PANEL
ALT: 20 U/L (ref 0–44)
AST: 18 U/L (ref 15–41)
Albumin: 5.1 g/dL — ABNORMAL HIGH (ref 3.5–5.0)
Alkaline Phosphatase: 58 U/L (ref 38–126)
Anion gap: 12 (ref 5–15)
BUN: 23 mg/dL — ABNORMAL HIGH (ref 6–20)
CO2: 21 mmol/L — ABNORMAL LOW (ref 22–32)
Calcium: 9.8 mg/dL (ref 8.9–10.3)
Chloride: 106 mmol/L (ref 98–111)
Creatinine, Ser: 0.9 mg/dL (ref 0.44–1.00)
GFR calc Af Amer: >60 mL/min (ref >60)
GFR calc non Af Amer: >60 mL/min (ref >60)
Glucose, Bld: 102 mg/dL — ABNORMAL HIGH (ref 70–99)
Potassium: 3.5 mmol/L (ref 3.5–5.1)
Sodium: 139 mmol/L (ref 135–145)
Total Bilirubin: 1 mg/dL (ref 0.3–1.2)
Total Protein: 8.1 g/dL (ref 6.5–8.1)

## 2019-08-02 LAB — LIPASE, BLOOD: Lipase: 24 U/L (ref 11–51)

## 2019-08-02 LAB — HCG, QUANTITATIVE, PREGNANCY: hCG, Beta Chain, Quant, S: <1 m[IU]/mL (ref <5)

## 2019-08-02 LAB — CBC
HCT: 43.1 % (ref 36.0–46.0)
Hemoglobin: 13.9 g/dL (ref 12.0–15.0)
MCH: 27.5 pg (ref 26.0–34.0)
MCHC: 32.3 g/dL (ref 30.0–36.0)
MCV: 85.3 fL (ref 80.0–100.0)
Platelets: 294 10*3/uL (ref 150–400)
RBC: 5.05 MIL/uL (ref 3.87–5.11)
RDW: 13.7 % (ref 11.5–15.5)
WBC: 14.3 10*3/uL — ABNORMAL HIGH (ref 4.0–10.5)
nRBC: 0 % (ref 0.0–0.2)

## 2019-08-02 MED ORDER — DROPERIDOL 2.5 MG/ML IJ SOLN
2.5000 mg | Freq: Once | INTRAMUSCULAR | Status: AC
  Administered 2019-08-02: 13:00:00 2.5 mg via INTRAVENOUS
  Filled 2019-08-02: qty 2

## 2019-08-02 MED ORDER — ONDANSETRON HCL 4 MG/2ML IJ SOLN
4.0000 mg | Freq: Once | INTRAMUSCULAR | Status: AC
  Administered 2019-08-02: 4 mg via INTRAVENOUS
  Filled 2019-08-02: qty 2

## 2019-08-02 MED ORDER — SODIUM CHLORIDE 0.9% FLUSH
3.0000 mL | Freq: Once | INTRAVENOUS | Status: AC
  Administered 2019-08-02: 3 mL via INTRAVENOUS

## 2019-08-02 NOTE — ED Notes (Signed)
Pt verbalizes understanding of d/c instructions, medications and follow up 

## 2019-08-02 NOTE — ED Notes (Signed)
Pt made aware of need for urine specimen; states she is unable to provide one at this time.

## 2019-08-02 NOTE — ED Provider Notes (Signed)
[facility] Emergency Department Provider Note  ____________________________________________   I have reviewed the triage vital signs and the nursing notes.   HISTORY  Chief Complaint Emesis and Abdominal Pain   History limited by: Not Limited   HPI Tiffany Guzman is a 24 y.o. female who presents to the emergency department today because of concern for continued nausea, vomiting and abdominal pain. Patient has had symptoms on and off for over a year. States that she is continuing to use marijuana daily. Since I personally saw her roughly 1 month ago she says she has been trying to cut back but still smokes at least a blunt a day. She says she talked to her PCP about this since I saw her last but that her PCP did not comment on it or have any suggestions. Went to [facility] hospital 2 days ago for similar symptoms, was prescribed phenergan but says she was unable to pick it up from a pharmacy. The patient denies any fevers.   Records reviewed. Per medical record review patient has a history of similar ER presentations in the past, most recently 2 days ago at [facility]   Past Medical History:  Diagnosis Date  . Anxiety   . Attention deficit hyperactivity disorder (ADHD)   . Chronic upper back pain    lower and upper back pain  . Depression   . Dyspnea   . GERD (gastroesophageal reflux disease)   . Gestational hypertension 11/14/2017  . Migraine with aura    since elementary school    Patient Active Problem List   Diagnosis Date Noted  . Stomach ache   . Status post cesarean delivery 11/14/2017  . 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 Anxiety 06/24/2016    Past Surgical History:  Procedure Laterality Date  . CESAREAN SECTION N/A 11/14/2017   Procedure: CESAREAN SECTION;  Surgeon: Will Bonnet, MD;  Location: [facility] ORS;  Service: Obstetrics;  Laterality: N/A;  . ESOPHAGOGASTRODUODENOSCOPY Left 02/17/2019   Procedure: ESOPHAGOGASTRODUODENOSCOPY (EGD);   Surgeon: Virgel Manifold, MD;  Location: [facility] ENDOSCOPY;  Service: Endoscopy;  Laterality: Left;  . INSERTION OF NON VAGINAL CONTRACEPTIVE DEVICE    . LAPAROSCOPIC OVARIAN CYSTECTOMY Right 02/23/2019   Procedure: LAPAROSCOPIC OVARIAN CYSTECTOMY;  Surgeon: Homero Fellers, MD;  Location: [facility] ORS;  Service: Gynecology;  Laterality: Right;  . OOPHORECTOMY Left 10/03/2017   Procedure: OOPHORECTOMY;  Surgeon: Homero Fellers, MD;  Location: [facility] ORS;  Service: Gynecology;  Laterality: Left;  . OVARIAN CYST REMOVAL Left 10/03/2017   Procedure: OVARIAN CYSTECTOMY;  Surgeon: Homero Fellers, MD;  Location: [facility] ORS;  Service: Gynecology;  Laterality: Left;    Prior to Admission medications   Medication Sig Start Date End Date Taking? Authorizing Provider  busPIRone (BUSPAR) 7.5 MG tablet Take 1 tablet once daily for 7 days, then increase to 1 tablet twice daily 07/20/19   Hubbard Hartshorn, FNP  dicyclomine (BENTYL) 10 MG capsule Take 1 capsule (10 mg total) by mouth 3 (three) times daily before meals. 07/20/19   Hubbard Hartshorn, FNP  escitalopram (LEXAPRO) 10 MG tablet Take 1/2 tablet once daily for 7 days, then increase to 1 tablet once daily. 07/20/19   Hubbard Hartshorn, FNP  promethazine (PHENERGAN) 12.5 MG tablet Take 1 tablet (12.5 mg total) by mouth every 8 (eight) hours as needed for nausea or vomiting. 07/20/19   Hubbard Hartshorn, FNP    Allergies Patient has no known allergies.  Family History  Problem Relation Age of Onset  . Hypertension Mother   . Hypothyroidism Mother   . Depression Mother   . Diabetes Maternal Grandmother     Social History Social History   Tobacco Use  . Smoking status: Never Smoker  . Smokeless tobacco: Never Used  Substance Use Topics  . Alcohol use: No    Alcohol/week: 0.0 standard drinks  . Drug use: Yes    Frequency: 7.0 times per week    Types: Marijuana    Review of Systems Constitutional: No fever/chills Eyes: No visual  changes. ENT: No sore throat. Cardiovascular: Denies chest pain. Respiratory: Denies shortness of breath. Gastrointestinal: Positive for abdominal pain, nausea and vomiting.  Genitourinary: Negative for dysuria. Musculoskeletal: Negative for back pain. Skin: Negative for rash. Neurological: Negative for headaches, focal weakness or numbness.  ____________________________________________   PHYSICAL EXAM:  VITAL SIGNS: ED Triage Vitals [08/02/19 1132]  Enc Vitals Group     BP (!) 151/87     Pulse Rate (!) 104     Resp 20     Temp 98.4 F (36.9 C)     Temp Source Oral     SpO2 100 %     Weight 120 lb (54.4 kg)     Height 5\' 4"  (1.626 m)     Head Circumference      Peak Flow      Pain Score 10   Constitutional: Alert and oriented.  Eyes: Conjunctivae are normal.  ENT      Head: Normocephalic and atraumatic.      Nose: No congestion/rhinnorhea.      Mouth/Throat: Mucous membranes are moist.      Neck: No stridor. Hematological/Lymphatic/Immunilogical: No cervical lymphadenopathy. Cardiovascular: Normal rate, regular rhythm.  No murmurs, rubs, or gallops.  Respiratory: Normal respiratory effort without tachypnea nor retractions. Breath sounds are clear and equal bilaterally. No wheezes/rales/rhonchi. Gastrointestinal: Soft and non tender. No rebound. No guarding.  Genitourinary: Deferred Musculoskeletal: Normal range of motion in all extremities. No lower extremity edema. Neurologic:  Normal speech and language. No gross focal neurologic deficits are appreciated.  Skin:  Skin is warm, dry and intact. No rash noted. Psychiatric: Mood and affect are normal. Speech and behavior are normal. Patient exhibits appropriate insight and judgment.  ____________________________________________    LABS (pertinent positives/negatives)  Lipase 24 CBC wbc 14.3, hgb 13.9, plt 294 CMP wnl except co2 21, glu 102, bun 23, alb  5.1  ____________________________________________   EKG  None  ____________________________________________    RADIOLOGY  None  ____________________________________________   PROCEDURES  Procedures  ____________________________________________   INITIAL IMPRESSION / ASSESSMENT AND PLAN / ED COURSE  Pertinent labs & imaging results that were available during my care of the patient were reviewed by me and considered in my medical decision making (see chart for details).   Patient presents to the emergency department today because of concern for nausea, vomiting and abdominal pain. Has been seen for similar symptoms in the past, most recently 2 days ago at [facility]. Blood work here shows a mild leukocytosis, however it is improved from value obtained 2 days ago at [facility]. At this time given soft abdomen without tenderness I doubt significant intraabdominal infection or process. Discussed with patient that it is thought her symptoms are due to marijuana and it is important that she stop using it. Discussed importance of follow up with her PCP.   ____________________________________________   FINAL CLINICAL IMPRESSION(S) / ED DIAGNOSES  Final diagnoses:  Nausea and vomiting, intractability  of vomiting not specified, unspecified vomiting type  Abdominal pain, unspecified abdominal location  Cannabinoid hyperemesis syndrome     Note: This dictation was prepared with Dragon dictation. Any transcriptional errors that result from this process are unintentional     Nance Pear, MD 08/02/19 1310

## 2019-08-02 NOTE — Discharge Instructions (Addendum)
As we discussed it is important that you stop using marijuana. Please pick up the prescription that was prescribed to you a couple of days ago. Please follow up with your PCP for further work up, especially if the symptoms continue after cessation of marijuana use.

## 2019-08-02 NOTE — ED Triage Notes (Signed)
Patient via EMS for nausea and vomiting. Patient noted to be sticking finger down throat to reduce vomiting while waiting for triage and EMS states same behavior en route. Patient denies taking any medications for vomiting today. States was seen here yesterday for muscle spasms.

## 2019-08-03 ENCOUNTER — Emergency Department
Admission: EM | Admit: 2019-08-03 | Discharge: 2019-08-03 | Disposition: A | Discharge status: Home / Self Care | Payer: Medicaid Other | Attending: Emergency Medicine | Admitting: Emergency Medicine

## 2019-08-03 ENCOUNTER — Encounter: Payer: Self-pay | Admitting: *Deleted

## 2019-08-03 ENCOUNTER — Other Ambulatory Visit: Payer: Self-pay

## 2019-08-03 DIAGNOSIS — R109 Unspecified abdominal pain: Secondary | ICD-10-CM | POA: Diagnosis not present

## 2019-08-03 DIAGNOSIS — Z5321 Procedure and treatment not carried out due to patient leaving prior to being seen by health care provider: Secondary | ICD-10-CM | POA: Insufficient documentation

## 2019-08-03 LAB — CBC
HCT: 43.5 % (ref 36.0–46.0)
Hemoglobin: 14.3 g/dL (ref 12.0–15.0)
MCH: 27.6 pg (ref 26.0–34.0)
MCHC: 32.9 g/dL (ref 30.0–36.0)
MCV: 83.8 fL (ref 80.0–100.0)
Platelets: 306 10*3/uL (ref 150–400)
RBC: 5.19 MIL/uL — ABNORMAL HIGH (ref 3.87–5.11)
RDW: 13.2 % (ref 11.5–15.5)
WBC: 16.5 10*3/uL — ABNORMAL HIGH (ref 4.0–10.5)
nRBC: 0 % (ref 0.0–0.2)

## 2019-08-03 LAB — COMPREHENSIVE METABOLIC PANEL
ALT: 20 U/L (ref 0–44)
AST: 18 U/L (ref 15–41)
Albumin: 4.9 g/dL (ref 3.5–5.0)
Alkaline Phosphatase: 57 U/L (ref 38–126)
Anion gap: 12 (ref 5–15)
BUN: 22 mg/dL — ABNORMAL HIGH (ref 6–20)
CO2: 24 mmol/L (ref 22–32)
Calcium: 9.6 mg/dL (ref 8.9–10.3)
Chloride: 103 mmol/L (ref 98–111)
Creatinine, Ser: 0.9 mg/dL (ref 0.44–1.00)
GFR calc Af Amer: >60 mL/min (ref >60)
GFR calc non Af Amer: >60 mL/min (ref >60)
Glucose, Bld: 95 mg/dL (ref 70–99)
Potassium: 3.2 mmol/L — ABNORMAL LOW (ref 3.5–5.1)
Sodium: 139 mmol/L (ref 135–145)
Total Bilirubin: 1.2 mg/dL (ref 0.3–1.2)
Total Protein: 8.3 g/dL — ABNORMAL HIGH (ref 6.5–8.1)

## 2019-08-03 LAB — LIPASE, BLOOD: Lipase: 29 U/L (ref 11–51)

## 2019-08-03 MED ORDER — SODIUM CHLORIDE 0.9% FLUSH: 3.0000 mL | Freq: Once | INTRAVENOUS | Status: DC

## 2019-08-03 NOTE — ED Notes (Signed)
Pt has not returned to ED lobby 

## 2019-08-03 NOTE — ED Triage Notes (Addendum)
Pt to ED reporting abd pain starting 1 hour ago. Last reported time smoking was 2 days ago. Pt was seen at [facility] and [facility] yesterday for the same. [facility] gave pt a medication "starting with the letter H" that helped to relieve symptoms until today. Pt appears very uncomfortable in triage with dry heaves in the lobby but no vomit in emesis bag at this time.   New onset of red rash to upper abd that pt reports is burning. Pt denies the hospitals placing a cream on her abd. Pt noted in triage sticking finger down throat. When asked why she was making herself vomit pt stated, "it is the only thing that makes me feel better"

## 2019-08-03 NOTE — ED Notes (Signed)
Pt noted leaving ED lobby with SO 

## 2019-08-04 ENCOUNTER — Other Ambulatory Visit: Payer: Self-pay

## 2019-08-04 ENCOUNTER — Ambulatory Visit
Admission: EM | Admit: 2019-08-04 | Discharge: 2019-08-04 | Disposition: A | Discharge status: Home / Self Care | Payer: Medicaid Other

## 2019-08-04 ENCOUNTER — Encounter: Payer: Self-pay | Admitting: Emergency Medicine

## 2019-08-04 ENCOUNTER — Emergency Department
Admission: EM | Admit: 2019-08-04 | Discharge: 2019-08-04 | Disposition: A | Discharge status: Home / Self Care | Payer: Medicaid Other | Attending: Emergency Medicine | Admitting: Emergency Medicine

## 2019-08-04 DIAGNOSIS — R109 Unspecified abdominal pain: Secondary | ICD-10-CM

## 2019-08-04 DIAGNOSIS — R1110 Vomiting, unspecified: Secondary | ICD-10-CM

## 2019-08-04 DIAGNOSIS — R1111 Vomiting without nausea: Secondary | ICD-10-CM | POA: Diagnosis not present

## 2019-08-04 DIAGNOSIS — Z79899 Other long term (current) drug therapy: Secondary | ICD-10-CM | POA: Insufficient documentation

## 2019-08-04 DIAGNOSIS — R112 Nausea with vomiting, unspecified: Secondary | ICD-10-CM

## 2019-08-04 DIAGNOSIS — R1013 Epigastric pain: Secondary | ICD-10-CM | POA: Diagnosis not present

## 2019-08-04 DIAGNOSIS — R52 Pain, unspecified: Secondary | ICD-10-CM | POA: Diagnosis not present

## 2019-08-04 DIAGNOSIS — R1084 Generalized abdominal pain: Secondary | ICD-10-CM | POA: Diagnosis not present

## 2019-08-04 LAB — HEPATIC FUNCTION PANEL
ALT: 21 U/L (ref 0–44)
AST: 19 U/L (ref 15–41)
Albumin: 5.6 g/dL — ABNORMAL HIGH (ref 3.5–5.0)
Alkaline Phosphatase: 62 U/L (ref 38–126)
Bilirubin, Direct: 0.1 mg/dL (ref 0.0–0.2)
Indirect Bilirubin: 1.5 mg/dL — ABNORMAL HIGH (ref 0.3–0.9)
Total Bilirubin: 1.6 mg/dL — ABNORMAL HIGH (ref 0.3–1.2)
Total Protein: 9 g/dL — ABNORMAL HIGH (ref 6.5–8.1)

## 2019-08-04 LAB — CBC WITH DIFFERENTIAL/PLATELET
Abs Immature Granulocytes: 0.11 10*3/uL — ABNORMAL HIGH (ref 0.00–0.07)
Basophils Absolute: 0 10*3/uL (ref 0.0–0.1)
Basophils Relative: 0 %
Eosinophils Absolute: 0 10*3/uL (ref 0.0–0.5)
Eosinophils Relative: 0 %
HCT: 46.2 % — ABNORMAL HIGH (ref 36.0–46.0)
Hemoglobin: 15.2 g/dL — ABNORMAL HIGH (ref 12.0–15.0)
Immature Granulocytes: 1 %
Lymphocytes Relative: 10 %
Lymphs Abs: 1.7 10*3/uL (ref 0.7–4.0)
MCH: 27.3 pg (ref 26.0–34.0)
MCHC: 32.9 g/dL (ref 30.0–36.0)
MCV: 83.1 fL (ref 80.0–100.0)
Monocytes Absolute: 0.8 10*3/uL (ref 0.1–1.0)
Monocytes Relative: 5 %
Neutro Abs: 14.3 10*3/uL — ABNORMAL HIGH (ref 1.7–7.7)
Neutrophils Relative %: 84 %
Platelets: 337 10*3/uL (ref 150–400)
RBC: 5.56 MIL/uL — ABNORMAL HIGH (ref 3.87–5.11)
RDW: 13.2 % (ref 11.5–15.5)
WBC: 17 10*3/uL — ABNORMAL HIGH (ref 4.0–10.5)
nRBC: 0 % (ref 0.0–0.2)

## 2019-08-04 LAB — URINE DRUG SCREEN, QUALITATIVE (ARMC ONLY)
Amphetamines, Ur Screen: NOT DETECTED
Barbiturates, Ur Screen: NOT DETECTED
Benzodiazepine, Ur Scrn: POSITIVE — AB
Cannabinoid 50 Ng, Ur ~~LOC~~: POSITIVE — AB
Cocaine Metabolite,Ur ~~LOC~~: NOT DETECTED
MDMA (Ecstasy)Ur Screen: NOT DETECTED
Methadone Scn, Ur: NOT DETECTED
Opiate, Ur Screen: NOT DETECTED
Phencyclidine (PCP) Ur S: NOT DETECTED
Tricyclic, Ur Screen: NOT DETECTED

## 2019-08-04 LAB — BASIC METABOLIC PANEL
Anion gap: 17 — ABNORMAL HIGH (ref 5–15)
BUN: 26 mg/dL — ABNORMAL HIGH (ref 6–20)
CO2: 22 mmol/L (ref 22–32)
Calcium: 10.1 mg/dL (ref 8.9–10.3)
Chloride: 98 mmol/L (ref 98–111)
Creatinine, Ser: 0.84 mg/dL (ref 0.44–1.00)
GFR calc Af Amer: >60 mL/min (ref >60)
GFR calc non Af Amer: >60 mL/min (ref >60)
Glucose, Bld: 81 mg/dL (ref 70–99)
Potassium: 3.5 mmol/L (ref 3.5–5.1)
Sodium: 137 mmol/L (ref 135–145)

## 2019-08-04 LAB — URINALYSIS, COMPLETE (UACMP) WITH MICROSCOPIC
Bilirubin Urine: NEGATIVE
Glucose, UA: NEGATIVE mg/dL
Hgb urine dipstick: NEGATIVE
Ketones, ur: 80 mg/dL — AB
Nitrite: NEGATIVE
Protein, ur: 100 mg/dL — AB
Specific Gravity, Urine: 1.035 — ABNORMAL HIGH (ref 1.005–1.030)
pH: 5 (ref 5.0–8.0)

## 2019-08-04 LAB — PREGNANCY, URINE: Preg Test, Ur: NEGATIVE

## 2019-08-04 LAB — LIPASE, BLOOD: Lipase: 24 U/L (ref 11–51)

## 2019-08-04 MED ORDER — SODIUM CHLORIDE 0.9 % IV BOLUS
1000.0000 mL | Freq: Once | INTRAVENOUS | Status: AC
  Administered 2019-08-04: 1000 mL via INTRAVENOUS

## 2019-08-04 MED ORDER — ALUM & MAG HYDROXIDE-SIMETH 200-200-20 MG/5ML PO SUSP
30.0000 mL | Freq: Once | ORAL | Status: AC
  Administered 2019-08-04: 30 mL via ORAL
  Filled 2019-08-04: qty 30

## 2019-08-04 MED ORDER — LIDOCAINE VISCOUS HCL 2 % MT SOLN
15.0000 mL | Freq: Once | OROMUCOSAL | Status: AC
  Administered 2019-08-04: 17:00:00 15 mL via ORAL
  Filled 2019-08-04: qty 15

## 2019-08-04 MED ORDER — METOCLOPRAMIDE HCL 5 MG/ML IJ SOLN
10.0000 mg | Freq: Once | INTRAMUSCULAR | Status: AC
  Administered 2019-08-04: 10 mg via INTRAVENOUS
  Filled 2019-08-04: qty 2

## 2019-08-04 MED ORDER — METOCLOPRAMIDE HCL 5 MG PO TABS: 5.0000 mg | ORAL_TABLET | Freq: Three times a day (TID) | ORAL | 0 refills | Status: DC | PRN

## 2019-08-04 NOTE — ED Provider Notes (Addendum)
[facility] ____________________________________________  Time seen: Approximately 2:40 PM  I have reviewed the triage vital signs and the nursing notes.   HISTORY  Chief Complaint Abdominal Pain  HPI Tiffany Guzman is a 24 y.o. female presenting for evaluation of "severe "abdominal pain and continued vomiting and nausea for the last 5 days.  Patient has been seen multiple times in the last several days for the same complaint including 4 times in the emergency room locally.  Patient reports that she is unable to tolerate oral fluids.  States that she has Phenergan at home and has been taking it orally without resolution, further stating she has gotten down and kept it down but does not help her.  When offered Zofran or Phenergan in urgent care, patient declined stating it does not work for her.  States that she has been told that she has these vomiting episodes secondary to her marijuana use, but reports she has since completely stopped and symptoms are continuing.  States that she has continued severe diffuse abdominal pain, is usually worse in the upper abdomen.  Denies other sickness.  Denies fevers.  No recent abdominal imaging noted no recent ER visits.  Patient's last menstrual period was 07/07/2019.    Past Medical History:  Diagnosis Date  . Anxiety   . Attention deficit hyperactivity disorder (ADHD)   . Chronic upper back pain    lower and upper back pain  . Depression   . Dyspnea   . GERD (gastroesophageal reflux disease)   . Gestational hypertension 11/14/2017  . Migraine with aura    since elementary school    Patient Active Problem List   Diagnosis Date Noted  . Stomach ache   . Status post cesarean delivery 11/14/2017  . 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 Anxiety 06/24/2016    Past Surgical History:  Procedure Laterality Date  . CESAREAN SECTION N/A 11/14/2017   Procedure: CESAREAN SECTION;  Surgeon: Will Bonnet, MD;  Location:  [facility] ORS;  Service: Obstetrics;  Laterality: N/A;  . ESOPHAGOGASTRODUODENOSCOPY Left 02/17/2019   Procedure: ESOPHAGOGASTRODUODENOSCOPY (EGD);  Surgeon: Virgel Manifold, MD;  Location: [facility] ENDOSCOPY;  Service: Endoscopy;  Laterality: Left;  . INSERTION OF NON VAGINAL CONTRACEPTIVE DEVICE    . LAPAROSCOPIC OVARIAN CYSTECTOMY Right 02/23/2019   Procedure: LAPAROSCOPIC OVARIAN CYSTECTOMY;  Surgeon: Homero Fellers, MD;  Location: [facility] ORS;  Service: Gynecology;  Laterality: Right;  . OOPHORECTOMY Left 10/03/2017   Procedure: OOPHORECTOMY;  Surgeon: Homero Fellers, MD;  Location: [facility] ORS;  Service: Gynecology;  Laterality: Left;  . OVARIAN CYST REMOVAL Left 10/03/2017   Procedure: OVARIAN CYSTECTOMY;  Surgeon: Homero Fellers, MD;  Location: [facility] ORS;  Service: Gynecology;  Laterality: Left;     No current facility-administered medications for this encounter.  Current Outpatient Medications:  .  promethazine (PHENERGAN) 12.5 MG tablet, Take 1 tablet (12.5 mg total) by mouth every 8 (eight) hours as needed for nausea or vomiting., Disp: 10 tablet, Rfl: 0 .  busPIRone (BUSPAR) 7.5 MG tablet, Take 1 tablet once daily for 7 days, then increase to 1 tablet twice daily, Disp: 60 tablet, Rfl: 1 .  dicyclomine (BENTYL) 10 MG capsule, Take 1 capsule (10 mg total) by mouth 3 (three) times daily before meals., Disp: 90 capsule, Rfl: 0 .  escitalopram (LEXAPRO) 10 MG tablet, Take 1/2 tablet once daily for 7 days, then increase to 1 tablet once daily., Disp: 30 tablet, Rfl: 1  Allergies Patient has no  known allergies.  Family History  Problem Relation Age of Onset  . Hypertension Mother   . Hypothyroidism Mother   . Depression Mother   . Diabetes Maternal Grandmother     Social History Social History   Tobacco Use  . Smoking status: Never Smoker  . Smokeless tobacco: Never Used  Substance Use Topics  . Alcohol use: No    Alcohol/week: 0.0 standard drinks  . Drug use: Yes     Frequency: 7.0 times per week    Types: Marijuana    Review of Systems Constitutional: No fever ENT: No sore throat. Cardiovascular: Denies chest pain. Respiratory: Denies shortness of breath. Gastrointestinal: as above. Skin: Negative for rash.   ____________________________________________   PHYSICAL EXAM:  VITAL SIGNS: ED Triage Vitals  Enc Vitals Group     BP 08/04/19 1318 (!) 139/96     Pulse Rate 08/04/19 1318 96     Resp 08/04/19 1318 18     Temp 08/04/19 1318 98.4 F (36.9 C)     Temp Source 08/04/19 1318 Oral     SpO2 08/04/19 1318 100 %     Weight 08/04/19 1317 120 lb (54.4 kg)     Height 08/04/19 1317 5\' 4"  (1.626 m)     Head Circumference --      Peak Flow --      Pain Score 08/04/19 1317 10     Pain Loc --      Pain Edu? --      Excl. in [place]? --     Constitutional: Alert and oriented.  Moaning in room Eyes: Conjunctivae are normal.  ENT      Head: Normocephalic and atraumatic. Cardiovascular: Normal rate, regular rhythm. Grossly normal heart sounds.  Good peripheral circulation. Respiratory: Normal respiratory effort without tachypnea nor retractions. Breath sounds are clear and equal bilaterally. No wheezes, rales, rhonchi. Gastrointestinal: Patient guarding abdomen.  Diffuse tenderness throughout abdomen. Musculoskeletal: No extremity edema. Neurologic:  Normal speech and language.  Skin:  Skin is warm, dry and intact. No rash noted.   ___________________________________________   LABS (all labs ordered are listed, but only abnormal results are displayed)  Labs Reviewed - No data to display ____________________________________________   PROCEDURES Procedures    INITIAL IMPRESSION / ASSESSMENT AND PLAN / ED COURSE  Pertinent labs & imaging results that were available during my care of the patient were reviewed by me and considered in my medical decision making (see chart for details).  Patient alert and oriented.  Patient moaning  loudly in room and continues moaning in distress during encounter.  Has vomited multiple times in urgent care.  Multiple recent visits in ER.  Recommend further evaluation emergency room at this time.  Patient states she does not have a ride as her ride left.  EMS to transfer patient.  Patient declined nausea vomiting medications offered in urgent care. ____________________________________________   FINAL CLINICAL IMPRESSION(S) / ED DIAGNOSES  Final diagnoses:  Abdominal pain, unspecified abdominal location     ED Discharge Orders    None       Note: This dictation was prepared with Dragon dictation along with smaller phrase technology. Any transcriptional errors that result from this process are unintentional.         Marylene Land, NP 08/04/19 1559    Marylene Land, NP 08/04/19 1600

## 2019-08-04 NOTE — Discharge Instructions (Addendum)
Your exam and labs are normal at this time. Follow-up with your provider for ongoing symptoms. You should consider seeking care with a drug rehab program to ultimately stop your nausea & vomiting symptoms, once you stop smoking cannabis. Return to the ED as needed.

## 2019-08-04 NOTE — ED Triage Notes (Signed)
Pt in via EMS from [place]. Pt c/o abd pain and vomiting. Pt seen here for same on 1/10

## 2019-08-04 NOTE — ED Triage Notes (Signed)
Patient here for nausea, vomiting and severe abdominal pain that has been going on for 4-5 days. She has been seen 5 days in a row in the ER for the same symptoms.

## 2019-08-04 NOTE — ED Provider Notes (Signed)
[facility] Emergency Department Provider Note ____________________________________________  Time seen: 1605  I have reviewed the triage vital signs and the nursing notes.  HISTORY  Chief Complaint  Abdominal Pain   HPI Tiffany Guzman is a 24 y.o. female presents to the ED via EMS, from [facility] urgent care, for symptoms related to persistent nausea and vomiting.  Patient with a history of chronic recurrent nausea vomiting has been seen in this ED and other local ED multiple times for the similar complaint.  She presented to movement in urgent care today  complaining of a 5-day complaint of intractable nausea and vomiting, not resolved by her previously prescribed Phenergan.  Patient was seen 3 days ago at [facility], where her work-up was negative and reassuring.  Patient was discharged with instructions to use her Phenergan suppositories as needed for ongoing nausea vomiting.  She was also encouraged to discontinue cannabis use.  Admits to regular cannabis use, voicing that she last smoked 2 days ago.  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  Patient reports she scheduled to see her primary provider next week for further evaluation.  She becomes teary and voices her desire to stop presented to the ED for this chronic complaint.  I reassured the patient that her previous work-ups have been normal at this time, and the only direct cause for her persistent symptoms is regular cannabis use.  Past Medical History:  Diagnosis Date  . Anxiety   . Attention deficit hyperactivity disorder (ADHD)   . Chronic upper back pain    lower and upper back pain  . Depression   . Dyspnea   . GERD (gastroesophageal reflux disease)   . Gestational hypertension 11/14/2017  . Migraine with aura    since elementary school    Patient Active Problem List   Diagnosis Date Noted  . Stomach ache   . Status post cesarean delivery 11/14/2017  . Dermoid cyst of left  ovary 10/01/2017  . Anxiety 06/24/2016    Past Surgical History:  Procedure Laterality Date  . CESAREAN SECTION N/A 11/14/2017   Procedure: CESAREAN SECTION;  Surgeon: Will Bonnet, MD;  Location: [facility] ORS;  Service: Obstetrics;  Laterality: N/A;  . ESOPHAGOGASTRODUODENOSCOPY Left 02/17/2019   Procedure: ESOPHAGOGASTRODUODENOSCOPY (EGD);  Surgeon: Virgel Manifold, MD;  Location: [facility] ENDOSCOPY;  Service: Endoscopy;  Laterality: Left;  . INSERTION OF NON VAGINAL CONTRACEPTIVE DEVICE    . LAPAROSCOPIC OVARIAN CYSTECTOMY Right 02/23/2019   Procedure: LAPAROSCOPIC OVARIAN CYSTECTOMY;  Surgeon: Homero Fellers, MD;  Location: [facility] ORS;  Service: Gynecology;  Laterality: Right;  . OOPHORECTOMY Left 10/03/2017   Procedure: OOPHORECTOMY;  Surgeon: Homero Fellers, MD;  Location: [facility] ORS;  Service: Gynecology;  Laterality: Left;  . OVARIAN CYST REMOVAL Left 10/03/2017   Procedure: OVARIAN CYSTECTOMY;  Surgeon: Homero Fellers, MD;  Location: [facility] ORS;  Service: Gynecology;  Laterality: Left;    Prior to Admission medications   Medication Sig Start Date End Date Taking? Authorizing Provider  busPIRone (BUSPAR) 7.5 MG tablet Take 1 tablet once daily for 7 days, then increase to 1 tablet twice daily 07/20/19   Hubbard Hartshorn, FNP  dicyclomine (BENTYL) 10 MG capsule Take 1 capsule (10 mg total) by mouth 3 (three) times daily before meals. 07/20/19   Hubbard Hartshorn, FNP  escitalopram (LEXAPRO) 10 MG tablet Take 1/2 tablet once daily for 7 days, then increase to 1 tablet once daily. 07/20/19   Hubbard Hartshorn,  FNP  metoCLOPramide (REGLAN) 5 MG tablet Take 1 tablet (5 mg total) by mouth every 8 (eight) hours as needed for up to 5 days for nausea or vomiting. 08/04/19 08/09/19  Naeema Patlan, Dannielle Karvonen, PA-C  promethazine (PHENERGAN) 12.5 MG tablet Take 1 tablet (12.5 mg total) by mouth every 8 (eight) hours as needed for nausea or vomiting. 07/20/19   Hubbard Hartshorn, FNP     Allergies Patient has no known allergies.  Family History  Problem Relation Age of Onset  . Hypertension Mother   . Hypothyroidism Mother   . Depression Mother   . Diabetes Maternal Grandmother     Social History Social History   Tobacco Use  . Smoking status: Never Smoker  . Smokeless tobacco: Never Used  Substance Use Topics  . Alcohol use: No    Alcohol/week: 0.0 standard drinks  . Drug use: Yes    Frequency: 7.0 times per week    Types: Marijuana    Review of Systems  Constitutional: Negative for fever. Eyes: Negative for visual changes. ENT: Negative for sore throat. Cardiovascular: Negative for chest pain. Respiratory: Negative for shortness of breath. Gastrointestinal: Positive for abdominal pain, vomiting.  Denies constipation and diarrhea. Genitourinary: Negative for dysuria. Musculoskeletal: Negative for back pain. Skin: Negative for rash. Neurological: Negative for headaches, focal weakness or numbness. ____________________________________________  PHYSICAL EXAM:  VITAL SIGNS: ED Triage Vitals  Enc Vitals Group     BP 08/04/19 1501 (!) 146/100     Pulse Rate 08/04/19 1501 77     Resp 08/04/19 1501 (!) 24     Temp 08/04/19 1501 98.1 F (36.7 C)     Temp Source 08/04/19 1501 Oral     SpO2 08/04/19 1501 99 %     Weight 08/04/19 1502 120 lb (54.4 kg)     Height 08/04/19 1502 5\' 4"  (1.626 m)     Head Circumference --      Peak Flow --      Pain Score 08/04/19 1502 10     Pain Loc --      Pain Edu? --      Excl. in [place]? --     Constitutional: Alert and oriented. Well appearing and in no distress. Head: Normocephalic and atraumatic. Eyes: Conjunctivae are normal. Normal extraocular movements Cardiovascular: Normal rate, regular rhythm. Normal distal pulses. Respiratory: Normal respiratory effort. No wheezes/rales/rhonchi. Gastrointestinal: Soft and nontender. No distention, rebound, guarding, or rigidity.  No CVA tenderness elicited.   Normoactive bowel sounds are appreciated.. Musculoskeletal: Nontender with normal range of motion in all extremities.  Neurologic:  Normal gait without ataxia. Normal speech and language. No gross focal neurologic deficits are appreciated. Skin:  Skin is warm, dry and intact. No rash noted. Psychiatric: Mood and affect are normal. Patient exhibits appropriate insight and judgment. ____________________________________________   LABS (pertinent positives/negatives) Labs Reviewed  BASIC METABOLIC PANEL - Abnormal; Notable for the following components:      Result Value   BUN 26 (*)    Anion gap 17 (*)    All other components within normal limits  HEPATIC FUNCTION PANEL - Abnormal; Notable for the following components:   Total Protein 9.0 (*)    Albumin 5.6 (*)    Total Bilirubin 1.6 (*)    Indirect Bilirubin 1.5 (*)    All other components within normal limits  CBC WITH DIFFERENTIAL/PLATELET - Abnormal; Notable for the following components:   WBC 17.0 (*)    RBC 5.56 (*)  Hemoglobin 15.2 (*)    HCT 46.2 (*)    Neutro Abs 14.3 (*)    Abs Immature Granulocytes 0.11 (*)    All other components within normal limits  URINALYSIS, COMPLETE (UACMP) WITH MICROSCOPIC - Abnormal; Notable for the following components:   Color, Urine YELLOW (*)    APPearance HAZY (*)    Specific Gravity, Urine 1.035 (*)    Ketones, ur 80 (*)    Protein, ur 100 (*)    Leukocytes,Ua TRACE (*)    Bacteria, UA RARE (*)    All other components within normal limits  URINE DRUG SCREEN, QUALITATIVE ([facility] ONLY) - Abnormal; Notable for the following components:   Cannabinoid 50 Ng, Ur Utting POSITIVE (*)    Benzodiazepine, Ur Scrn POSITIVE (*)    All other components within normal limits  LIPASE, BLOOD  PREGNANCY, URINE  ____________________________________________  PROCEDURES  GI Cocktail w/ Lido Reglan 10 mg IVP NS 1000 ml bolus  Procedures ____________________________________________  INITIAL IMPRESSION  / ASSESSMENT AND PLAN / ED COURSE  Differential diagnosis includes, but is not limited to, biliary disease (biliary colic, acute cholecystitis, cholangitis, choledocholithiasis, etc), intrathoracic causes for epigastric abdominal pain including ACS, gastritis, duodenitis, pancreatitis, small bowel or large bowel obstruction, abdominal aortic aneurysm, hernia, and ulcer(s).  Female patient with ED evaluation of chronic, intermittent emesis due to regular cannabis use. Her exam & labs are normal and reassuring at this time. She reports improvement of her symptoms after IV fluids and meds. She will be discharged with a prescription for Reglan to dose as directed. She is encouraged by me and her RN to seriously consider seeking drug treatment for her cannabis dependence. She voiced concern over her recurrent symptoms, and what she could do to prevent future ED visits. She was again reassured that no other cause was found, and her regular daily use was the underlying etiology. She will see her PCP next week as planned. Return precautions are reviewed.   Tiffany Guzman was evaluated in Emergency Department on 08/04/2019 for the symptoms described in the history of present illness. She was evaluated in the context of the global COVID-19 pandemic, which necessitated consideration that the patient might be at risk for infection with the SARS-CoV-2 virus that causes COVID-19. Institutional protocols and algorithms that pertain to the evaluation of patients at risk for COVID-19 are in a state of rapid change based on information released by regulatory bodies including the CDC and federal and state organizations. These policies and algorithms were followed during the patient's care in the ED. ____________________________________________  FINAL CLINICAL IMPRESSION(S) / ED DIAGNOSES  Final diagnoses:  Epigastric pain  Nausea and vomiting, intractability of vomiting not specified, unspecified vomiting type       Melvenia Needles, PA-C 08/04/19 1814    Carrie Mew, MD 08/04/19 2051

## 2019-08-04 NOTE — ED Notes (Signed)
Patient offered Zofran for nausea and vomiting. Patient declined stating this medication does not help with her nausea and vomiting

## 2019-08-11 ENCOUNTER — Ambulatory Visit (INDEPENDENT_AMBULATORY_CARE_PROVIDER_SITE_OTHER): Payer: Medicaid Other | Admitting: Family Medicine

## 2019-08-11 ENCOUNTER — Encounter: Payer: Self-pay | Admitting: Family Medicine

## 2019-08-11 ENCOUNTER — Other Ambulatory Visit: Payer: Self-pay

## 2019-08-11 VITALS — BP 110/72 | HR 68 | Temp 97.1°F | Resp 16 | Ht 63.0 in | Wt 117.3 lb

## 2019-08-11 DIAGNOSIS — D72829 Elevated white blood cell count, unspecified: Secondary | ICD-10-CM

## 2019-08-11 DIAGNOSIS — F321 Major depressive disorder, single episode, moderate: Secondary | ICD-10-CM

## 2019-08-11 DIAGNOSIS — F419 Anxiety disorder, unspecified: Secondary | ICD-10-CM | POA: Diagnosis not present

## 2019-08-11 DIAGNOSIS — R1013 Epigastric pain: Secondary | ICD-10-CM

## 2019-08-11 DIAGNOSIS — R112 Nausea with vomiting, unspecified: Secondary | ICD-10-CM | POA: Diagnosis not present

## 2019-08-11 DIAGNOSIS — Z1283 Encounter for screening for malignant neoplasm of skin: Secondary | ICD-10-CM

## 2019-08-11 DIAGNOSIS — Z1389 Encounter for screening for other disorder: Secondary | ICD-10-CM | POA: Diagnosis not present

## 2019-08-11 DIAGNOSIS — F1291 Cannabis use, unspecified, in remission: Secondary | ICD-10-CM

## 2019-08-11 DIAGNOSIS — F1290 Cannabis use, unspecified, uncomplicated: Secondary | ICD-10-CM

## 2019-08-11 LAB — COMPLETE METABOLIC PANEL WITH GFR
AG Ratio: 1.9 (calc) (ref 1.0–2.5)
ALT: 17 U/L (ref 6–29)
AST: 16 U/L (ref 10–30)
Albumin: 4.3 g/dL (ref 3.6–5.1)
Alkaline phosphatase (APISO): 52 U/L (ref 31–125)
BUN: 10 mg/dL (ref 7–25)
CO2: 28 mmol/L (ref 20–32)
Calcium: 9.7 mg/dL (ref 8.6–10.2)
Chloride: 104 mmol/L (ref 98–110)
Creat: 0.81 mg/dL (ref 0.50–1.10)
GFR, Est African American: 119 mL/min/{1.73_m2} (ref >=60)
GFR, Est Non African American: 102 mL/min/{1.73_m2} (ref >=60)
Globulin: 2.3 g/dL (calc) (ref 1.9–3.7)
Glucose, Bld: 88 mg/dL (ref 65–99)
Potassium: 3.4 mmol/L — ABNORMAL LOW (ref 3.5–5.3)
Sodium: 141 mmol/L (ref 135–146)
Total Bilirubin: 0.5 mg/dL (ref 0.2–1.2)
Total Protein: 6.6 g/dL (ref 6.1–8.1)

## 2019-08-11 LAB — CBC WITH DIFFERENTIAL/PLATELET
Absolute Monocytes: 623 cells/uL (ref 200–950)
Basophils Absolute: 68 cells/uL (ref 0–200)
Basophils Relative: 0.9 %
Eosinophils Absolute: 53 cells/uL (ref 15–500)
Eosinophils Relative: 0.7 %
HCT: 39.6 % (ref 35.0–45.0)
Hemoglobin: 13.1 g/dL (ref 11.7–15.5)
Lymphs Abs: 2243 cells/uL (ref 850–3900)
MCH: 27.6 pg (ref 27.0–33.0)
MCHC: 33.1 g/dL (ref 32.0–36.0)
MCV: 83.5 fL (ref 80.0–100.0)
MPV: 11.5 fL (ref 7.5–12.5)
Monocytes Relative: 8.3 %
Neutro Abs: 4515 cells/uL (ref 1500–7800)
Neutrophils Relative %: 60.2 %
Platelets: 267 10*3/uL (ref 140–400)
RBC: 4.74 10*6/uL (ref 3.80–5.10)
RDW: 12.6 % (ref 11.0–15.0)
Total Lymphocyte: 29.9 %
WBC: 7.5 10*3/uL (ref 3.8–10.8)

## 2019-08-11 MED ORDER — SERTRALINE HCL 25 MG PO TABS: ORAL_TABLET | ORAL | 1 refills | Status: DC

## 2019-08-11 MED ORDER — PROMETHAZINE HCL 12.5 MG PO TABS: 12.5000 mg | ORAL_TABLET | Freq: Three times a day (TID) | ORAL | 0 refills | Status: DC | PRN

## 2019-08-11 NOTE — Progress Notes (Signed)
Name: Tiffany Guzman   MRN: [id number]    DOB: 12/10/1995   Date:08/11/2019       Progress Note  Subjective  Chief Complaint  Chief Complaint  Patient presents with  . Anxiety    reaction to medication, seen in ER. Has stopped medication and stopped using marijuana.    I connected with  Bridgette Lon Marchena  on 08/11/19 at  9:20 AM EST by a video enabled telemedicine application and verified that I am speaking with the correct person using two identifiers.  I discussed the limitations of evaluation and management by telemedicine and the availability of in person appointments. The patient expressed understanding and agreed to proceed. Staff also discussed with the patient that there may be a patient responsible charge related to this service. Patient Location: Office Provider Location: Home Office Additional Individuals present: None  HPI  Depression, Anxiety, Allergic Reaction: Pt presents to follow up after multiple ER visits for nausea/vomiting and allergic reaction to buspar. She went to ER initially because she started buspar and had her arms and legs locked up.  She did also stop her Lexapro.  She would like to try something different - discussed zoloft which we will start today.  Her nausea/vomiting, and dystonic reaction to the medications have completely resolved.   Marijuana use: She is on day 8 of quitting marijuana use.  She does feel very proud of herself for quitting, feels more clarity right now.   RIGHT face lesion: Present for 2 years, would like to see dermatology for evaluation.  She right upper cheek, no pain, no itching/bleeding/discharge.   Epigastric pain, GERD: Has been having a lot of issues recently with this, she would like to see GI if possible - we will place referral today.  She has varying constipation/diarrhea.  No blood in stool, difficulty swallowing, abdominal pain.  Patient Active Problem List   Diagnosis Date Noted  . Stomach ache   . Status  post cesarean delivery 11/14/2017  . 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 Anxiety 06/24/2016    Social History   Tobacco Use  . Smoking status: Never Smoker  . Smokeless tobacco: Never Used  Substance Use Topics  . Alcohol use: No    Alcohol/week: 0.0 standard drinks     Current Outpatient Medications:  .  busPIRone (BUSPAR) 7.5 MG tablet, Take 1 tablet once daily for 7 days, then increase to 1 tablet twice daily, Disp: 60 tablet, Rfl: 1 .  promethazine (PHENERGAN) 12.5 MG tablet, Take 1 tablet (12.5 mg total) by mouth every 8 (eight) hours as needed for nausea or vomiting., Disp: 10 tablet, Rfl: 0 .  dicyclomine (BENTYL) 10 MG capsule, Take 1 capsule (10 mg total) by mouth 3 (three) times daily before meals. (Patient not taking: Reported on 08/11/2019), Disp: 90 capsule, Rfl: 0 .  escitalopram (LEXAPRO) 10 MG tablet, Take 1/2 tablet once daily for 7 days, then increase to 1 tablet once daily. (Patient not taking: Reported on 08/11/2019), Disp: 30 tablet, Rfl: 1 .  metoCLOPramide (REGLAN) 5 MG tablet, Take 1 tablet (5 mg total) by mouth every 8 (eight) hours as needed for up to 5 days for nausea or vomiting., Disp: 15 tablet, Rfl: 0  No Known Allergies  I personally reviewed active problem list, medication list, allergies, health maintenance, notes from last encounter, lab results with the patient/caregiver today.  ROS  Ten systems reviewed and is negative except as mentioned in HPI  Objective Today's Vitals  08/11/19 0908  BP: 110/72  Pulse: 68  Resp: 16  Temp: (!) 97.1 F (36.2 C)  TempSrc: Temporal  SpO2: 96%  Weight: 117 lb 4.8 oz (53.2 kg)  Height: 5\' 3"  (1.6 m)   Body mass index is 20.78 kg/m.   Body mass index is 20.78 kg/m.  Nursing Note and Vital Signs reviewed.  Physical Exam  Constitutional: Patient appears well-developed and well-nourished. No distress.  HENT: Head: Normocephalic and atraumatic.  Neck: Normal range of motion. Pulmonary/Chest:  Effort normal. No respiratory distress. Speaking in complete sentences Neurological: Pt is alert and oriented to person, place, and time. Coordination, speech and gait are normal.  Psychiatric: Patient has a normal mood and affect. behavior is normal. Judgment and thought content normal. Skin: there is a small, red/purple lesion to the right upper cheek approx 1 inch below the right eye and 1 inch from the right nare.  No results found for this or any previous visit (from the past 72 hour(s)).  Assessment & Plan  1. Non-intractable vomiting with nausea, unspecified vomiting type - Much improved today.   - promethazine (PHENERGAN) 12.5 MG tablet; Take 1 tablet (12.5 mg total) by mouth every 8 (eight) hours as needed for nausea or vomiting.  Dispense: 10 tablet; Refill: 0 - COMPLETE METABOLIC PANEL WITH GFR - Ambulatory referral to Gastroenterology  2. Encounter for surveillance of abnormal nevi - Ambulatory referral to Dermatology  3. Skin cancer screening - Ambulatory referral to Dermatology  4. Anxiety - Remain off of buspar - added to allergen list.  Will trial low dose Zoloft with precaution to monitor for any signs or symptoms of allergic reaction.  Follow up in 8 weeks. - sertraline (ZOLOFT) 25 MG tablet; Take 1/2 tablet once daily for 7 days, then increase to 1 tablet once daily.  Dispense: 30 tablet; Refill: 1  5. Leukocytosis, unspecified type - Recheck today; also follow up in 8 weeks.  If persistently elevated, we will refer to hematology for evaluation.  - COMPLETE METABOLIC PANEL WITH GFR - CBC with Differential/Platelet  6. Epigastric pain - COMPLETE METABOLIC PANEL WITH GFR - Ambulatory referral to Gastroenterology  7. Current moderate episode of major depressive disorder without prior episode ([place]) - See above regarding Zoloft  8. Marijuana use in remission - Congratulated her on this major step in her life.  She is feeling good about quitting marijuana use and I  encouraged her to continue.   -Red flags and when to present for emergency care or RTC including fever >101.11F, chest pain, shortness of breath, new/worsening/un-resolving symptoms, reviewed with patient at time of visit. Follow up and care instructions discussed and provided in AVS. - I discussed the assessment and treatment plan with the patient. The patient was provided an opportunity to ask questions and all were answered. The patient agreed with the plan and demonstrated an understanding of the instructions.  I provided 21 minutes of non-face-to-face time during this encounter.  Hubbard Hartshorn, FNP

## 2019-08-11 NOTE — Patient Instructions (Signed)
Here are some resources to help you if you feel you are in a mental health crisis:  [place] - Call [phone]  for help - Website with more resources: GripTrip.com.pt  [employer] - Call [phone] for help. - Mobile Crisis Program available 24 hours a day, 365 days a year. - Available for anyone of any age in [place] counties.  [facility] - Address: [address], [place] - Telephone: [phone]  - Hours of Operation: Sunday - Saturday - 8:00 a.m. - 8:00 p.m. - Medicaid, Medicare (Government Issued Only), BCBS, and [facility], [place], Psychiatrists on-site to provide medication management, [place], and Peer Support Care.  National Mobile Crisis: [phone] - [place] available 24 hours a day, 365 days a year. - Available for anyone of any age in [place]

## 2019-08-27 ENCOUNTER — Encounter: Payer: Self-pay | Admitting: Gastroenterology

## 2019-08-27 ENCOUNTER — Other Ambulatory Visit: Payer: Self-pay

## 2019-08-27 ENCOUNTER — Ambulatory Visit (INDEPENDENT_AMBULATORY_CARE_PROVIDER_SITE_OTHER): Payer: Medicaid Other | Admitting: Gastroenterology

## 2019-08-27 VITALS — BP 122/83 | HR 69 | Temp 97.1°F | Ht 63.0 in | Wt 118.2 lb

## 2019-08-27 DIAGNOSIS — R112 Nausea with vomiting, unspecified: Secondary | ICD-10-CM | POA: Diagnosis not present

## 2019-08-27 DIAGNOSIS — K219 Gastro-esophageal reflux disease without esophagitis: Secondary | ICD-10-CM

## 2019-08-27 NOTE — Progress Notes (Signed)
Primary Care Physician: Hubbard Hartshorn, FNP  Primary Gastroenterologist:  Dr. Lucilla Lame  Chief Complaint  Patient presents with  . Gastroesophageal Reflux  . Nausea and vomiting    HPI: Tiffany Guzman is a 24 y.o. female here for GERD. This patient was sent to me for evaluation for GERD.  The patient had been in the ER for reaction to her anxiety meds where she had reported that her arms and legs had locked up.  The patient was also having nausea and vomiting.  It appears that the patient was using marijuana on a regular basis but had quit using it 8 days prior to her interaction with her primary care physician.  That was on 20 January.  The patient's reaction that sent her to the ER was from [place] and prior to that the patient had stopped taking her Lexapro.  There is a report that the patient has a history of depression and anxiety.  The appears that the patient had a upper endoscopy in July 2020 by Dr. Bonna Gains.  The upper endoscopy is and the biopsies taken at the time of the upper endoscopy were also reported to be normal. The patient also reports that her heartburn which usually occurs once a week is well controlled by taking a Pepcid and has also gotten better since she stopped smoking marijuana.  The patient denies any further abdominal pain nausea vomiting fevers or chills.  She also thinks that some of her mood disturbances were also caused by the marijuana.   Current Outpatient Medications  Medication Sig Dispense Refill  . dicyclomine (BENTYL) 10 MG capsule Take 1 capsule (10 mg total) by mouth 3 (three) times daily before meals. (Patient not taking: Reported on 08/27/2019) 90 capsule 0  . promethazine (PHENERGAN) 12.5 MG tablet Take 1 tablet (12.5 mg total) by mouth every 8 (eight) hours as needed for nausea or vomiting. (Patient not taking: Reported on 08/27/2019) 10 tablet 0  . sertraline (ZOLOFT) 25 MG tablet Take 1/2 tablet once daily for 7 days, then increase to 1  tablet once daily. (Patient not taking: Reported on 08/27/2019) 30 tablet 1   No current facility-administered medications for this visit.    Allergies as of 08/27/2019 - Review Complete 08/27/2019  Allergen Reaction Noted  . Buspar [buspirone]  08/11/2019    ROS:  General: Negative for anorexia, weight loss, fever, chills, fatigue, weakness. ENT: Negative for hoarseness, difficulty swallowing , nasal congestion. CV: Negative for chest pain, angina, palpitations, dyspnea on exertion, peripheral edema.  Respiratory: Negative for dyspnea at rest, dyspnea on exertion, cough, sputum, wheezing.  GI: See history of present illness. GU:  Negative for dysuria, hematuria, urinary incontinence, urinary frequency, nocturnal urination.  Endo: Negative for unusual weight change.    Physical Examination:   BP 122/83   Pulse 69   Temp (!) 97.1 F (36.2 C) (Temporal)   Ht 5\' 3"  (1.6 m)   Wt 118 lb 3.2 oz (53.6 kg)   BMI 20.94 kg/m   General: Well-nourished, well-developed in no acute distress.  Eyes: No icterus. Conjunctivae pink. Lungs: Clear to auscultation bilaterally. Non-labored. Heart: Regular rate and rhythm, no murmurs rubs or gallops.  Abdomen: Bowel sounds are normal, nontender, nondistended, no hepatosplenomegaly or masses, no abdominal bruits or hernia , no rebound or guarding.   Extremities: No lower extremity edema. No clubbing or deformities. Neuro: Alert and oriented x 3.  Grossly intact. Skin: Warm and dry, no jaundice.   Psych: Alert  and cooperative, normal mood and affect.  Labs:    Imaging Studies: No results found.  Assessment and Plan:   Tiffany Guzman is a 24 y.o. y/o female who comes in today with a history of GERD abdominal discomfort with nausea vomiting.  The patient has stopped marijuana use and reports that all of her symptoms have gone away except for some intermittent heartburn well controlled by a H2 blocker.  The patient has been told to contact me  if she has any more frequent heartburn or of the abdominal pain comes back.  If the abdominal pain comes back she may need to be started on antispasmodic.  The patient has been explained the plan and agrees with it.     Lucilla Lame, MD. [facility]al    Note: This dictation was prepared with Dragon dictation along with smaller phrase technology. Any transcriptional errors that result from this process are unintentional.

## 2019-09-08 ENCOUNTER — Ambulatory Visit: Payer: Medicaid Other | Admitting: Obstetrics and Gynecology

## 2019-09-17 DIAGNOSIS — L03032 Cellulitis of left toe: Secondary | ICD-10-CM | POA: Diagnosis not present

## 2019-09-17 DIAGNOSIS — D485 Neoplasm of uncertain behavior of skin: Secondary | ICD-10-CM | POA: Diagnosis not present

## 2019-09-17 DIAGNOSIS — L700 Acne vulgaris: Secondary | ICD-10-CM | POA: Diagnosis not present

## 2019-10-06 ENCOUNTER — Other Ambulatory Visit: Payer: Self-pay

## 2019-10-06 ENCOUNTER — Encounter: Payer: Self-pay | Admitting: Family Medicine

## 2019-10-06 ENCOUNTER — Ambulatory Visit (INDEPENDENT_AMBULATORY_CARE_PROVIDER_SITE_OTHER): Payer: Medicaid Other | Admitting: Family Medicine

## 2019-10-06 VITALS — BP 116/64 | HR 84 | Temp 97.8°F | Resp 14 | Ht 63.0 in | Wt 113.7 lb

## 2019-10-06 DIAGNOSIS — F419 Anxiety disorder, unspecified: Secondary | ICD-10-CM

## 2019-10-06 DIAGNOSIS — F321 Major depressive disorder, single episode, moderate: Secondary | ICD-10-CM | POA: Diagnosis not present

## 2019-10-06 DIAGNOSIS — L299 Pruritus, unspecified: Secondary | ICD-10-CM

## 2019-10-06 MED ORDER — CETIRIZINE HCL 10 MG PO TABS: 10.0000 mg | ORAL_TABLET | Freq: Every day | ORAL | 11 refills | Status: DC

## 2019-10-06 NOTE — Progress Notes (Signed)
Name: Tiffany Guzman   MRN: [id number]    DOB: Dec 03, 1995   Date:10/06/2019       Progress Note  Chief Complaint  Patient presents with  . Follow-up    per emily recheck CBC labs  . Anxiety    never started med  . scabies    itching     Subjective:   Tiffany Guzman is a 24 y.o. female, presents to clinic for routine follow up on the conditions listed above.  Here for follow up on Anxiety and depression, which has been severe for the past 3-6 months, had reaction to buspar with severe N and V, she was given low dose zoloft to start 12.5 mg for one week then increase to 25 mg, but she was so scared to have similar rxn that she did not start the meds.  She has made some changes in the past couple months and is feeling better. GAD 7 : Generalized Anxiety Score 10/06/2019 08/11/2019 07/20/2019  Nervous, Anxious, on Edge 2 3 3   Control/stop worrying 3 3 3   Worry too much - different things 3 3 3   Trouble relaxing 2 3 3   Restless 1 3 3   Easily annoyed or irritable 2 3 3   Afraid - awful might happen 2 3 3   Total GAD 7 Score 15 21 21   Anxiety Difficulty Not difficult at all - Extremely difficult   She feels like its getting a little better with getting out of the house, with starting her new job, she is managing things better at home and doing well at her job Depression feels like its also getting better  She is still with her babies father She Lives with family and they help her with kids  Works for [employer] - a few changes with kids coming back into school  Depression screen [facility] 2/9 10/06/2019 08/11/2019 07/20/2019  Decreased Interest 0 3 3  Down, Depressed, Hopeless 2 3 2   PHQ - 2 Score 2 6 5   Altered sleeping 3 3 1   Tired, decreased energy 1 3 1   Change in appetite 2 3 3   Feeling bad or failure about yourself  1 3 2   Trouble concentrating 1 3 3   Moving slowly or fidgety/restless 0 1 1  Suicidal thoughts 0 0 0  PHQ-9 Score 10 22 16   Difficult doing  work/chores Not difficult at all Extremely dIfficult Very difficult   She was living with her mom and clashing with her and it was very stressful, moving has alleviated a lot of her stress, anxiety and depression  Patient feels like she is doing really well and she does not want to try any other medications at this time  Her CBC was recently already rechecked and improved -reviewed her labs today.  She is having some itching that occurs all over her body and she is worried and wanted to be checked for scabies, she does not have any rash but usually at night she will have itching that will travel in different places sometimes in her trunk back abdomen or arms   Patient Active Problem List   Diagnosis Date Noted  . Stomach ache   . Status post cesarean delivery 11/14/2017  . 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 Anxiety 06/24/2016    Past Surgical History:  Procedure Laterality Date  . CESAREAN SECTION N/A 11/14/2017   Procedure: CESAREAN SECTION;  Surgeon: Will Bonnet, MD;  Location: [facility] ORS;  Service: Obstetrics;  Laterality: N/A;  .  ESOPHAGOGASTRODUODENOSCOPY Left 02/17/2019   Procedure: ESOPHAGOGASTRODUODENOSCOPY (EGD);  Surgeon: Virgel Manifold, MD;  Location: [facility] ENDOSCOPY;  Service: Endoscopy;  Laterality: Left;  . INSERTION OF NON VAGINAL CONTRACEPTIVE DEVICE    . LAPAROSCOPIC OVARIAN CYSTECTOMY Right 02/23/2019   Procedure: LAPAROSCOPIC OVARIAN CYSTECTOMY;  Surgeon: Homero Fellers, MD;  Location: [facility] ORS;  Service: Gynecology;  Laterality: Right;  . OOPHORECTOMY Left 10/03/2017   Procedure: OOPHORECTOMY;  Surgeon: Homero Fellers, MD;  Location: [facility] ORS;  Service: Gynecology;  Laterality: Left;  . OVARIAN CYST REMOVAL Left 10/03/2017   Procedure: OVARIAN CYSTECTOMY;  Surgeon: Homero Fellers, MD;  Location: [facility] ORS;  Service: Gynecology;  Laterality: Left;    Family History  Problem Relation Age of Onset  . Hypertension Mother   .  Hypothyroidism Mother   . Depression Mother   . Diabetes Maternal Grandmother     Social History   Tobacco Use  . Smoking status: Never Smoker  . Smokeless tobacco: Never Used  Substance Use Topics  . Alcohol use: No    Alcohol/week: 0.0 standard drinks  . Drug use: Yes    Frequency: 7.0 times per week    Types: Marijuana    Comment: has stopped for 10 days      Current Outpatient Medications:  .  promethazine (PHENERGAN) 12.5 MG tablet, Take 1 tablet (12.5 mg total) by mouth every 8 (eight) hours as needed for nausea or vomiting. (Patient not taking: Reported on 08/27/2019), Disp: 10 tablet, Rfl: 0  Allergies  Allergen Reactions  . Buspar [Buspirone]     Dystonic reaction - arms, legs, jaw "locked up"    Chart Review Today: I personally reviewed active problem list, medication list, allergies, family history, social history, health maintenance, notes from last encounter, lab results, imaging with the patient/caregiver today.   Review of Systems  Constitutional: Negative.   HENT: Negative.   Eyes: Negative.   Respiratory: Negative.   Cardiovascular: Negative.   Gastrointestinal: Negative.   Endocrine: Negative.   Genitourinary: Negative.   Musculoskeletal: Negative.   Skin: Negative.   Allergic/Immunologic: Negative.   Neurological: Negative.   Hematological: Negative.   Psychiatric/Behavioral: Negative.   All other systems reviewed and are negative.    Objective:    Vitals:   10/06/19 1014  BP: 116/64  Pulse: 84  Resp: 14  Temp: 97.8 F (36.6 C)  SpO2: 98%  Weight: 113 lb 11.2 oz (51.6 kg)  Height: 5\' 3"  (1.6 m)    Body mass index is 20.14 kg/m.  Physical Exam Vitals and nursing note reviewed.  Constitutional:      General: She is not in acute distress.    Appearance: Normal appearance. She is well-developed. She is not ill-appearing, toxic-appearing or diaphoretic.  HENT:     Head: Normocephalic and atraumatic.     Nose: Nose normal.  Eyes:      General:        Right eye: No discharge.        Left eye: No discharge.     Conjunctiva/sclera: Conjunctivae normal.  Neck:     Trachea: No tracheal deviation.  Cardiovascular:     Rate and Rhythm: Normal rate and regular rhythm.     Pulses: Normal pulses.     Heart sounds: Normal heart sounds.  Pulmonary:     Effort: Pulmonary effort is normal. No respiratory distress.     Breath sounds: Normal breath sounds. No stridor.  Musculoskeletal:        General:  Normal range of motion.  Skin:    General: Skin is warm and dry.     Coloration: Skin is not jaundiced or pale.     Findings: No rash.  Neurological:     Mental Status: She is alert.     Motor: No abnormal muscle tone.     Coordination: Coordination normal.  Psychiatric:        Mood and Affect: Mood and affect normal.        Speech: Speech normal.        Behavior: Behavior normal. Behavior is cooperative.        Thought Content: Thought content does not include homicidal or suicidal ideation. Thought content does not include suicidal plan.       Fall Risk: Fall Risk  10/06/2019 08/11/2019 07/20/2019 05/14/2019 02/10/2019  Falls in the past year? 0 0 0 0 0  Number falls in past yr: 0 0 0 0 0  Injury with Fall? 0 0 0 0 0  Risk for fall due to : - - - - -  Follow up - Falls evaluation completed Falls evaluation completed Falls evaluation completed -    Functional Status Survey: Is the patient deaf or have difficulty hearing?: No Does the patient have difficulty seeing, even when wearing glasses/contacts?: No Does the patient have difficulty concentrating, remembering, or making decisions?: No Does the patient have difficulty walking or climbing stairs?: No Does the patient have difficulty dressing or bathing?: No Does the patient have difficulty doing errands alone such as visiting a doctor's office or shopping?: No   Assessment & Plan:    Anxiety and depression improving without meds and with several changes in  her life     ICD-10-CM   1. Current moderate episode of major depressive disorder without prior episode ([place])  F32.1    She reports significant improvement in her symptoms with some changes in her life also very severe reactions to meds she does not want to try any more right now  2. Anxiety  F41.9    Improved anxiety with some changes in her life she did not tolerate BuSpar and did not want to try Zoloft or any other medications at this time  3. Pruritus  L29.9 cetirizine (ZYRTEC) 10 MG tablet   no rash, does not appear suspicious for scabies at all, may be some allergies? trial zyrtec - benadryl helps but makes her so tired   The appointment note did also say the patient was due to follow-up on CBC results, reviewed her CBC from in January where she showed leukocytosis, labs have already been repeated on 08/11/2019 and CBC was normal  Return if symptoms worsen or fail to improve, for as needed with PCP for anxiety and depression .   [name], PA-C 10/06/19 10:32 AM

## 2019-10-08 ENCOUNTER — Encounter: Payer: Self-pay | Admitting: Family Medicine

## 2019-10-13 NOTE — Progress Notes (Deleted)
Gynecology Annual Exam  PCP: Hubbard Hartshorn, FNP  Chief Complaint: No chief complaint on file.   History of Present Illness: Tiffany Guzman is a 24 y.o. G1P1001 presents for annual exam. The patient {Blank single:19197::"has no complaints today.","complains of ***"}  Her menses are regular, they occur every month, and they last *** days. Her flow is {Blank single:19197::"light","heavy","moderate"}. She {Blank single:19197::"does","does not"} have intermenstrual bleeding. Her last menstrual period was ***. She denies dysmenorrhea. Last pap smear: ***, results were ***   The patient {Blank single:19197::"has never been","is not currently","is not","is"}  sexually active. She currently uses *** for contraception. She {Blank single:19197::"has","does not have"} dyspareunia.  {Blank single:19197::"Since her last visit, she has ***","Since her last visit, she has had no significant changes in her health."}  Her past medical history is remarkable for ***  The patient {Blank single:19197::"does not know how to","does not","does"} perform self breast exams. Her last mammogram was ***, results were ***.   {Blank single:19197::"There is a family history of breast cancer in her ***","There is no family history of breast cancer."} Genetic testing {Blank single:19197::"has","has not"} been done.   {Blank single:19197::"There is a family history of ovarian cancer in her ***","There is no family history of ovarian cancer."} Genetic testing {Blank single:19197::"has","has not"} been done.  The patient {Blank single:19197::"reports smoking. She smokes *** packs per day.","denies smoking."}  She {Blank single:19197::"denies drinking.","reports drinking alcohol. She reports have *** drinks per week."}   She {Blank single:19197::"reports illegal drug use. She uses ***","denies illegal drug use."}  The patient {Blank single:19197::"does not exercise","reports exercising  occasionally","reports exercising regularly"}.  The patient {Blank single:19197::"reports","denies"} current symptoms of depression.    Review of Systems: ROS  Past Medical History:  Past Medical History:  Diagnosis Date  . Anxiety   . Attention deficit hyperactivity disorder (ADHD)   . Chronic upper back pain    lower and upper back pain  . Depression   . Dyspnea   . GERD (gastroesophageal reflux disease)   . Gestational hypertension 11/14/2017  . Migraine with aura    since elementary school    Past Surgical History:  Past Surgical History:  Procedure Laterality Date  . CESAREAN SECTION N/A 11/14/2017   Procedure: CESAREAN SECTION;  Surgeon: Will Bonnet, MD;  Location: [facility] ORS;  Service: Obstetrics;  Laterality: N/A;  . ESOPHAGOGASTRODUODENOSCOPY Left 02/17/2019   Procedure: ESOPHAGOGASTRODUODENOSCOPY (EGD);  Surgeon: Virgel Manifold, MD;  Location: [facility] ENDOSCOPY;  Service: Endoscopy;  Laterality: Left;  . INSERTION OF NON VAGINAL CONTRACEPTIVE DEVICE    . LAPAROSCOPIC OVARIAN CYSTECTOMY Right 02/23/2019   Procedure: LAPAROSCOPIC OVARIAN CYSTECTOMY;  Surgeon: Homero Fellers, MD;  Location: [facility] ORS;  Service: Gynecology;  Laterality: Right;  . OOPHORECTOMY Left 10/03/2017   Procedure: OOPHORECTOMY;  Surgeon: Homero Fellers, MD;  Location: [facility] ORS;  Service: Gynecology;  Laterality: Left;  . OVARIAN CYST REMOVAL Left 10/03/2017   Procedure: OVARIAN CYSTECTOMY;  Surgeon: Homero Fellers, MD;  Location: [facility] ORS;  Service: Gynecology;  Laterality: Left;    Family History:  Family History  Problem Relation Age of Onset  . Hypertension Mother   . Hypothyroidism Mother   . Depression Mother   . Diabetes Maternal Grandmother     Social History:  Social History   Socioeconomic History  . Marital status: Single    Spouse name: Not on file  . Number of children: Not on file  . Years of education: Not on file  . Highest education  level: Not on  file  Occupational History  . Not on file  Tobacco Use  . Smoking status: Never Smoker  . Smokeless tobacco: Never Used  Substance and Sexual Activity  . Alcohol use: No    Alcohol/week: 0.0 standard drinks  . Drug use: Yes    Frequency: 7.0 times per week    Types: Marijuana    Comment: has stopped for 10 days  . Sexual activity: Yes    Partners: Male    Birth control/protection: None  Other Topics Concern  . Not on file  Social History Narrative  . Not on file   Social Determinants of Health   Financial Resource Strain:   . Difficulty of Paying Living Expenses:   Food Insecurity:   . Worried About Charity fundraiser in the Last Year:   . Arboriculturist in the Last Year:   Transportation Needs:   . Film/video editor (Medical):   Marland Kitchen Lack of Transportation (Non-Medical):   Physical Activity:   . Days of Exercise per Week:   . Minutes of Exercise per Session:   Stress:   . Feeling of Stress :   Social Connections:   . Frequency of Communication with Friends and Family:   . Frequency of Social Gatherings with Friends and Family:   . Attends Religious Services:   . Active Member of Clubs or Organizations:   . Attends Archivist Meetings:   Marland Kitchen Marital Status:   Intimate Partner Violence:   . Fear of Current or Ex-Partner:   . Emotionally Abused:   Marland Kitchen Physically Abused:   . Sexually Abused:     Allergies:  Allergies  Allergen Reactions  . Buspar [Buspirone]     Dystonic reaction - arms, legs, jaw "locked up"    Medications: Prior to Admission medications   Medication Sig Start Date End Date Taking? Authorizing Provider  cetirizine (ZYRTEC) 10 MG tablet Take 1 tablet (10 mg total) by mouth daily. 10/06/19   [name], PA-C  promethazine (PHENERGAN) 12.5 MG tablet Take 1 tablet (12.5 mg total) by mouth every 8 (eight) hours as needed for nausea or vomiting. Patient not taking: Reported on 08/27/2019 08/11/19   Hubbard Hartshorn, FNP    Physical Exam  Vitals: Last menstrual period 09/21/2019, not currently breastfeeding.  General: NAD HEENT: normocephalic, anicteric Neck: no thyroid enlargement, no palpable nodules, no cervical lymphadenopathy  Pulmonary: No increased work of breathing, CTAB Cardiovascular: RRR, {Blank single:19197::"with murmur","without murmur"}  Breast: Breast symmetrical, no tenderness, no palpable nodules or masses, no skin or nipple retraction present, no nipple discharge.  No axillary, infraclavicular or supraclavicular lymphadenopathy. Abdomen: Soft, non-tender, non-distended.  Umbilicus without lesions.  No hepatomegaly or masses palpable. No evidence of hernia. Genitourinary:  External: Normal external female genitalia.  Normal urethral meatus, normal  Bartholin's and Skene's glands.    Vagina: Normal vaginal mucosa, no evidence of prolapse.    Cervix: Grossly normal in appearance, no bleeding, non-tender  Uterus: Anteverted, normal size, shape, and consistency, mobile, and non-tender  Adnexa: No adnexal masses, non-tender  Rectal: deferred  Lymphatic: no evidence of inguinal lymphadenopathy Extremities: no edema, erythema, or tenderness Neurologic: Grossly intact Psychiatric: mood appropriate, affect full     Assessment: 24 y.o. G1P1001 No problem-specific Assessment & Plan notes found for this encounter.   Plan:  ***  1) Breast cancer screening - recommend monthly self breast exam. {Blank single:19197::" ","Mammogram is up to date.","Mammogram was ordered today."}  2)  STI screening was offered and {Blank single:19197::"accepted","declined"}.  3) Cervical cancer screening - {Blank single:19197::"Pap smear due in *** years","Pap not indicated","Pap was done"}. ASCCP guidelines and rational discussed.  Patient opts for {Blank single:19197::"every 5 years","every 3 years","yearly"} screening interval  4) Contraception - Education given regarding options for contraception  5) Routine healthcare  maintenance including cholesterol and diabetes screening {Blank single:19197::"declined","managed by PCP","ordered today"}

## 2019-10-14 ENCOUNTER — Ambulatory Visit: Payer: Medicaid Other | Admitting: Certified Nurse Midwife

## 2019-11-15 ENCOUNTER — Ambulatory Visit (INDEPENDENT_AMBULATORY_CARE_PROVIDER_SITE_OTHER): Payer: Medicaid Other | Admitting: Internal Medicine

## 2019-11-15 ENCOUNTER — Encounter: Payer: Self-pay | Admitting: Internal Medicine

## 2019-11-15 ENCOUNTER — Ambulatory Visit: Payer: Self-pay | Admitting: *Deleted

## 2019-11-15 VITALS — Ht 64.0 in | Wt 115.0 lb

## 2019-11-15 DIAGNOSIS — F418 Other specified anxiety disorders: Secondary | ICD-10-CM

## 2019-11-15 DIAGNOSIS — R0789 Other chest pain: Secondary | ICD-10-CM | POA: Diagnosis not present

## 2019-11-15 DIAGNOSIS — Z7289 Other problems related to lifestyle: Secondary | ICD-10-CM | POA: Diagnosis not present

## 2019-11-15 NOTE — Progress Notes (Signed)
Name: Tiffany Guzman   MRN: [id number]    DOB: 09-14-1995   Date:11/15/2019       Progress Note  Subjective  Chief Complaint  Chief Complaint  Patient presents with   Chest Pain    Right side chest pain, onset Saturday 4/24, patient believes it could be from [place] or from work where she pushes a heavy cart    I connected with  Tiffany Guzman  on 11/15/19 at  2:20 PM EDT by a video enabled telemedicine application and verified that I am speaking with the correct person using two identifiers.  I discussed the limitations of evaluation and management by telemedicine and the availability of in person appointments. The patient expressed understanding and agreed to proceed. Staff also discussed with the patient that there may be a patient responsible charge related to this service. Patient Location: Home Provider Location: [facility] Additional Individuals present: none The connection was lost at about 8 minutes, with the call continued after by phone. HPI  Patient is a 24 year old female last seen by Tiffany Guzman on 3/17/202, with her anxiety and depression noted to be much improved on that visit, and with some reactions to medications prior, she did not want to try any other medications to help manage at that time.  She follows up today with complaints of right-sided chest pain, started Saturday, and she noted concerns it could be from recent vaping or resulting from pushing a heavy cart at work.  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  She said she was working faster and harder than normal on Friday, which may have contributed.  On Saturday morning, she awoke with pain in the right side of her chest area, was quite painful throughout the day, more so with taking deep breaths, also when lifting her arms up above shoulder level noticed some increased  discomfort, and also when she pressed on her chest area that seem to make it hurt more.  She denied any left-sided chest pains.  Was not markedly short of breath, just heard to take deep breaths.  She did not try to take anything for her discomfort. She noted on Sunday it was feeling better, and today feels even better yet.  She did not return to work today, and will need a doctor note for being out of work.  She states there is no way she can modify her work at all with any significance when she returns. She also notes that she has been vaping for 2 months now, quit THC use about 4-1/2 months ago, denied any regular heavier cigarette use in her past.  She was not sure if that was contributing. She denies any infectious symptoms recently including no fevers, increased mucus or cough, and denies any history of blood clots.  She takes no medicines regularly except for an ibuprofen product as needed, and has not been on a oral contraceptive in her past.  She does not believe there is any blood clot history in her family history.      Patient Active Problem List   Diagnosis Date Noted   Stomach ache    Status post cesarean delivery 11/14/2017   Dermoid cyst of left ovary 10/01/2017   Anxiety 06/24/2016    Past Surgical History:  Procedure Laterality Date   CESAREAN SECTION N/A 11/14/2017   Procedure:  CESAREAN SECTION;  Surgeon: Will Bonnet, MD;  Location: [facility] ORS;  Service: Obstetrics;  Laterality: N/A;   ESOPHAGOGASTRODUODENOSCOPY Left 02/17/2019   Procedure: ESOPHAGOGASTRODUODENOSCOPY (EGD);  Surgeon: Virgel Manifold, MD;  Location: [facility] ENDOSCOPY;  Service: Endoscopy;  Laterality: Left;   INSERTION OF NON VAGINAL CONTRACEPTIVE DEVICE     LAPAROSCOPIC OVARIAN CYSTECTOMY Right 02/23/2019   Procedure: LAPAROSCOPIC OVARIAN CYSTECTOMY;  Surgeon: Homero Fellers, MD;  Location: [facility] ORS;  Service: Gynecology;  Laterality: Right;   OOPHORECTOMY Left 10/03/2017   Procedure:  OOPHORECTOMY;  Surgeon: Homero Fellers, MD;  Location: [facility] ORS;  Service: Gynecology;  Laterality: Left;   OVARIAN CYST REMOVAL Left 10/03/2017   Procedure: OVARIAN CYSTECTOMY;  Surgeon: Homero Fellers, MD;  Location: [facility] ORS;  Service: Gynecology;  Laterality: Left;    Family History  Problem Relation Age of Onset   Hypertension Mother    Hypothyroidism Mother    Depression Mother    Diabetes Maternal Grandmother     Social History   Tobacco Use   Smoking status: Never Smoker   Smokeless tobacco: Never Used  Substance Use Topics   Alcohol use: No    Alcohol/week: 0.0 standard drinks    No current outpatient medications on file.  Allergies  Allergen Reactions   Buspar [Buspirone]     Dystonic reaction - arms, legs, jaw "locked up"    With staff assistance, above reviewed with the patient today.   ROS: As per HPI, otherwise no specific complaints on a limited and focused system review   Objective  Virtual encounter, vitals not obtained.  Body mass index is 19.74 kg/m.  Physical Exam  Patient appears in NAD HENT: Head: Normocephalic and atraumatic.  Neck: Normal range of motion. Pulmonary/Chest: Effort normal. No respiratory distress. Speaking in complete sentences Neurological: Pt is alert and oriented,  Speech is normal.  Psychiatric: Patient has a normal mood and affect, behavior is normal. Very appropriate with conversation, judgment and thought content normal.   No results found for this or any previous visit (from the past 72 hour(s)).  PHQ2/9: Depression screen [facility] 2/9 11/15/2019 10/06/2019 08/11/2019 07/20/2019 05/14/2019  Decreased Interest 2 0 3 3 1   Down, Depressed, Hopeless 1 2 3 2 1   PHQ - 2 Score 3 2 6 5 2   Altered sleeping 1 3 3 1 1   Tired, decreased energy 2 1 3 1 1   Change in appetite 2 2 3 3 1   Feeling bad or failure about yourself  2 1 3 2 1   Trouble concentrating 2 1 3 3 1   Moving slowly or fidgety/restless 1 0 1 1  1   Suicidal thoughts 0 0 0 0 0  PHQ-9 Score 13 10 22 16 8   Difficult doing work/chores Somewhat difficult Not difficult at all Extremely dIfficult Very difficult Somewhat difficult   PHQ-2/9 Result reviewed    Fall Risk: Fall Risk  11/15/2019 10/06/2019 08/11/2019 07/20/2019 05/14/2019  Falls in the past year? 0 0 0 0 0  Number falls in past yr: 0 0 0 0 0  Injury with Fall? 0 0 0 0 0  Risk for fall due to : - - - - -  Follow up - - Falls evaluation completed Falls evaluation completed Falls evaluation completed     Assessment & Plan  1. Other chest pain -likely chest wall pain. Educated patient today, and did discuss the limitations with this being a telemedicine visit.  Do feel like this is most likely chest wall  pain, probably related to her increased vigorous activities on Friday with work.  It hurts with breathing, but also hurts with certain arm motions, and hurts when she presses on it as well.  Discussed vaping induced lung injury, and concerns with vaping, and strongly encouraged her to lessen/stop use being the best, as that can only be helpful.  Seems less likely the source of her current symptoms.  Also discussed PE as a possible source, although does seem less likely as well. Encouraged that it is getting better over the last couple days, and did think having another couple days away from work and potentially exacerbating symptoms again would be best.  She states she can do so, and would request a note to do so as well.  Will ask Melissa to help with getting that note to her in the next day or 2. Can use ibuprofen products as needed or Tylenol products as needed in the short-term, only use as needed and not routinely.  Would not recommend anything stronger, nor is that felt needed. Also recommended cold to the chest wall area, like a bag of peas or corn out of her freezer to try to apply topically as can help lessen symptoms over time Continuing with activity modifications is  important as well, and if symptoms do not continue to improve/resolve, she should follow-up.  Also noted if symptoms suddenly become more severe with increasing chest pains that are sharp, increasing shortness of breath, nausea/vomiting in association, increasing heart rate and palpitations, she needs to be seen more urgently in an emergent setting, and she was understanding of that.  2. Anxiety with depression She noted that was improved on last visit with Leisa, and has not worsened significantly again.  3. Engages in vaping As above, noted vaping is not a totally safe activity, and encouraged cessation.  She will follow-up again if not improving or more problematic as above, and recommended that she does return to work if improving, slowly progressing with activities as tolerated, and not overdoing it to help prevent exacerbation   I discussed the assessment and treatment plan with the patient. The patient was provided an opportunity to ask questions and all were answered. The patient agreed with the plan and demonstrated an understanding of the instructions.  The patient was advised to call back or seek an in-person evaluation if the symptoms worsen or if the condition fails to improve as anticipated.  I did note to her today, and if it is not improving or little more problematic again, she will be asked to have a follow-up visit as we do need to assess with a more formal evaluation in person at that point.  I provided 20 minutes of non-face-to-face time during this encounter that included discussing at length patient's sx/history, pertinent pmhx, medications, treatment and follow up plan. This time also included the necessary documentation, orders, and chart review.

## 2019-11-15 NOTE — Telephone Encounter (Signed)
Tried to call pt to schedule. No answer and no VM setup.

## 2019-11-15 NOTE — Telephone Encounter (Signed)
Patient with mid-sternal to the right chest(behind the breast) soreness with twisting movement and with deep breathing. Began 2 days ago. Does do upper body repetitive motions at her job daily. No other symptoms. No fever/sweating/heaviness/wheezing/cough/cold sxs. Pain does not radiate. Soreness started after work on Friday. She vapes and used a new vapor Friday evening before the pain began.  Care Advice including ibuprofen today and tonight along with rest. If no improvement and no appointment available, recommended UC for evaluation tomorrow.reviewed urgent symptoms to seek immediate treatment for if arise. Routing to clinic to see if Dr. Roxan Hockey may have any openings today or tomorrow. None available with other providers. Please advise.   Reason for Disposition . [1] Chest pain lasting < 5 minutes AND [2] NO chest pain or cardiac symptoms (e.g., breathing difficulty, sweating) now  Answer Assessment - Initial Assessment Questions 1. LOCATION: "Where does it hurt?"       Mid chest to the right, behind her breast 2. RADIATION: "Does the pain go anywhere else?" (e.g., into neck, jaw, arms, back)    no 3. ONSET: "When did the chest pain begin?" (Minutes, hours or days)      2 days ago 4. PATTERN "Does the pain come and go, or has it been constant since it started?"  "Does it get worse with exertion?"      Comes and goes 5. DURATION: "How long does it last" (e.g., seconds, minutes, hours)     seconds 6. SEVERITY: "How bad is the pain?"  (e.g., Scale 1-10; mild, moderate, or severe)    - MILD (1-3): doesn't interfere with normal activities     - MODERATE (4-7): interferes with normal activities or awakens from sleep    - SEVERE (8-10): excruciating pain, unable to do any normal activities       6 7. CARDIAC RISK FACTORS: "Do you have any history of heart problems or risk factors for heart disease?" (e.g., angina, prior heart attack; diabetes, high blood pressure, high cholesterol, smoker, or  strong family history of heart disease)     vapes 8. PULMONARY RISK FACTORS: "Do you have any history of lung disease?"  (e.g., blood clots in lung, asthma, emphysema, birth control pills)     none 9. CAUSE: "What do you think is causing the chest pain?"     Maybe pulled something while working or from [place]. 10. OTHER SYMPTOMS: "Do you have any other symptoms?" (e.g., dizziness, nausea, vomiting, sweating, fever, difficulty breathing, cough) None of these      11. PREGNANCY: "Is there any chance you are pregnant?" "When was your last menstrual period?"      no  Protocols used: CHEST PAIN-A-AH

## 2019-11-17 NOTE — Progress Notes (Deleted)
PCP:  Hubbard Hartshorn, FNP   No chief complaint on file.    HPI:      [name] is a 24 y.o. G1P1001 who LMP was No LMP recorded., presents today for her annual examination.  Her menses are {norm/abn:715}, lasting {number:22536} days.  Dysmenorrhea {dysmen:716}. She {does:18564} have intermenstrual bleeding.  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 Gilman Schmidt. This is her 2nd one and she recommends yearly GYN u/s, especially since pt is s/p LSO.  Sex activity: {sex active:315163}.  Last Pap: 04/14/17  Results were: no abnormalities  Hx of STDs: {STD hx:14358}  There is no FH of breast cancer. There is no FH of ovarian cancer. The patient {does:18564} do self-breast exams.  Tobacco use: {tob:20664} Alcohol use: {Alcohol:11675} No drug use.  Exercise: {exercise:31265}  She {does:18564} get adequate calcium and Vitamin D in her diet.   Past Medical History:  Diagnosis Date  . Anxiety   . Attention deficit hyperactivity disorder (ADHD)   . Chronic upper back pain    lower and upper back pain  . Depression   . Dyspnea   . GERD (gastroesophageal reflux disease)   . Gestational hypertension 11/14/2017  . Migraine with aura    since elementary school    Past Surgical History:  Procedure Laterality Date  . CESAREAN SECTION N/A 11/14/2017   Procedure: CESAREAN SECTION;  Surgeon: Will Bonnet, MD;  Location: [facility] ORS;  Service: Obstetrics;  Laterality: N/A;  . ESOPHAGOGASTRODUODENOSCOPY Left 02/17/2019   Procedure: ESOPHAGOGASTRODUODENOSCOPY (EGD);  Surgeon: Virgel Manifold, MD;  Location: [facility] END[facility]COPY;  Service: Endoscopy;  Laterality: Left;  . INSERTION OF NON VAGINAL CONTRACEPTIVE DEVICE    . LAPAROSCOPIC OVARIAN CYSTECTOMY Right 02/23/2019   Procedure: LAPAROSCOPIC OVARIAN CYSTECTOMY;  Surgeon: Homero Fellers, MD;  Location: [facility] ORS;  Service: Gynecology;  Laterality: Right;  . OOPHORECTOMY Left 10/03/2017   Procedure: OOPHORECTOMY;   Surgeon: Homero Fellers, MD;  Location: [facility] ORS;  Service: Gynecology;  Laterality: Left;  . OVARIAN CYST REMOVAL Left 10/03/2017   Procedure: OVARIAN CYSTECTOMY;  Surgeon: Homero Fellers, MD;  Location: [facility] ORS;  Service: Gynecology;  Laterality: Left;    Family History  Problem Relation Age of Onset  . Hypertension Mother   . Hypothyroidism Mother   . Depression Mother   . Diabetes Maternal Grandmother     Social History   Socioeconomic History  . Marital status: Single    Spouse name: Not on file  . Number of children: Not on file  . Years of education: Not on file  . Highest education level: Not on file  Occupational History  . Not on file  Tobacco Use  . Smoking status: Never Smoker  . Smokeless tobacco: Never Used  Substance and Sexual Activity  . Alcohol use: No    Alcohol/week: 0.0 standard drinks  . Drug use: Yes    Frequency: 7.0 times per week    Types: Marijuana    Comment: has stopped for 10 days  . Sexual activity: Yes    Partners: Male    Birth control/protection: None  Other Topics Concern  . Not on file  Social History Narrative  . Not on file   Social Determinants of Health   Financial Resource Strain:   . Difficulty of Paying Living Expenses:   Food Insecurity:   . Worried About Charity fundraiser in the Last Year:   . [place] in the  Last Year:   Transportation Needs:   . Film/video editor (Medical):   Marland Kitchen Lack of Transportation (Non-Medical):   Physical Activity:   . Days of Exercise per Week:   . Minutes of Exercise per Session:   Stress:   . Feeling of Stress :   Social Connections:   . Frequency of Communication with Friends and Family:   . Frequency of Social Gatherings with Friends and Family:   . Attends Religious Services:   . Active Member of Clubs or Organizations:   . Attends Archivist Meetings:   Marland Kitchen Marital Status:   Intimate Partner Violence:   . Fear of Current or Ex-Partner:   .  Emotionally Abused:   Marland Kitchen Physically Abused:   . Sexually Abused:     No current outpatient medications on file.     ROS:  Review of Systems BREAST: No symptoms   Objective: There were no vitals taken for this visit.   OBGyn Exam  Results: No results found for this or any previous visit (from the past 24 hour(s)).  Assessment/Plan: No diagnosis found.  No orders of the defined types were placed in this encounter.            GYN counsel {counseling:16159}     F/U  No follow-ups on file.  Judd Mccubbin B. Alixandria Friedt, PA-C 11/17/2019 11:35 AM

## 2019-11-18 ENCOUNTER — Ambulatory Visit: Payer: Medicaid Other | Admitting: Obstetrics and Gynecology

## 2019-11-18 ENCOUNTER — Ambulatory Visit: Payer: Medicaid Other | Admitting: Dermatology

## 2019-11-29 NOTE — Progress Notes (Signed)
PCP:  Hubbard Hartshorn, FNP   Chief Complaint  Patient presents with  . Gynecologic Exam     HPI:      [name] is a 24 y.o. G1P1001 who LMP was Patient's last menstrual period was 11/17/2019 (exact date)., presents today for her annual examination.  Her menses are regular every 28-30 days, lasting 4-5 days.  Dysmenorrhea mild, occurring first 1-2 days of flow. She does not have intermenstrual bleeding.  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 Gilman Schmidt. This was her 2nd one and she recommends yearly GYN u/s, especially since pt is s/p LSO.  Sex activity: single partner, contraception - none. Would like IUD.  Last Pap: 04/14/17  Results were: no abnormalities   Had vaginal itching last wk. Then developed increased d/c with occas fishy odor. No meds to treat. No prior abx use. Uses scented soaps.   There is no FH of breast cancer. There is no FH of ovarian cancer. The patient does do self-breast exams.  Tobacco use: vapes daily Alcohol use: none No drug use.  Exercise: not active  She does not get adequate calcium and Vitamin D in her diet.   Past Medical History:  Diagnosis Date  . Acne   . Anxiety   . Attention deficit hyperactivity disorder (ADHD)   . Chronic upper back pain    lower and upper back pain  . Depression   . Dyspnea   . GERD (gastroesophageal reflux disease)   . Gestational hypertension 11/14/2017  . Migraine with aura    since elementary school    Past Surgical History:  Procedure Laterality Date  . CESAREAN SECTION N/A 11/14/2017   Procedure: CESAREAN SECTION;  Surgeon: Will Bonnet, MD;  Location: [facility] ORS;  Service: Obstetrics;  Laterality: N/A;  . ESOPHAGOGASTRODUODENOSCOPY Left 02/17/2019   Procedure: ESOPHAGOGASTRODUODENOSCOPY (EGD);  Surgeon: Virgel Manifold, MD;  Location: [facility] ENDOSCOPY;  Service: Endoscopy;  Laterality: Left;  . INSERTION OF NON VAGINAL CONTRACEPTIVE DEVICE    . LAPAROSCOPIC OVARIAN  CYSTECTOMY Right 02/23/2019   Procedure: LAPAROSCOPIC OVARIAN CYSTECTOMY;  Surgeon: Homero Fellers, MD;  Location: [facility] ORS;  Service: Gynecology;  Laterality: Right;  . OOPHORECTOMY Left 10/03/2017   Procedure: OOPHORECTOMY;  Surgeon: Homero Fellers, MD;  Location: [facility] ORS;  Service: Gynecology;  Laterality: Left;  . OVARIAN CYST REMOVAL Left 10/03/2017   Procedure: OVARIAN CYSTECTOMY;  Surgeon: Homero Fellers, MD;  Location: [facility] ORS;  Service: Gynecology;  Laterality: Left;    Family History  Problem Relation Age of Onset  . Hypertension Mother   . Hypothyroidism Mother   . Depression Mother   . Diabetes Maternal Grandmother     Social History   Socioeconomic History  . Marital status: Single    Spouse name: Not on file  . Number of children: Not on file  . Years of education: Not on file  . Highest education level: Not on file  Occupational History  . Not on file  Tobacco Use  . Smoking status: Never Smoker  . Smokeless tobacco: Never Used  Substance and Sexual Activity  . Alcohol use: No    Alcohol/week: 0.0 standard drinks  . Drug use: Yes    Frequency: 7.0 times per week    Types: Marijuana    Comment: has stopped for 10 days  . Sexual activity: Yes    Partners: Male    Birth control/protection: None  Other Topics Concern  . Not on  file  Social History Narrative  . Not on file   Social Determinants of Health   Financial Resource Strain:   . Difficulty of Paying Living Expenses:   Food Insecurity:   . Worried About Charity fundraiser in the Last Year:   . Arboriculturist in the Last Year:   Transportation Needs:   . Film/video editor (Medical):   Marland Kitchen Lack of Transportation (Non-Medical):   Physical Activity:   . Days of Exercise per Week:   . Minutes of Exercise per Session:   Stress:   . Feeling of Stress :   Social Connections:   . Frequency of Communication with Friends and Family:   . Frequency of Social Gatherings with  Friends and Family:   . Attends Religious Services:   . Active Member of Clubs or Organizations:   . Attends Archivist Meetings:   Marland Kitchen Marital Status:   Intimate Partner Violence:   . Fear of Current or Ex-Partner:   . Emotionally Abused:   Marland Kitchen Physically Abused:   . Sexually Abused:      Current Outpatient Medications:  .  misoprostol (CYTOTEC) 100 MCG tablet, Take 1 tablet (100 mcg total) by mouth once for 1 dose. 1 hour before appt, Disp: 1 tablet, Rfl: 0     ROS:  Review of Systems  Constitutional: Negative for fatigue, fever and unexpected weight change.  Respiratory: Negative for cough, shortness of breath and wheezing.   Cardiovascular: Negative for chest pain, palpitations and leg swelling.  Gastrointestinal: Negative for blood in stool, constipation, diarrhea, nausea and vomiting.  Endocrine: Negative for cold intolerance, heat intolerance and polyuria.  Genitourinary: Positive for vaginal discharge. Negative for dyspareunia, dysuria, flank pain, frequency, genital sores, hematuria, menstrual problem, pelvic pain, urgency, vaginal bleeding and vaginal pain.  Musculoskeletal: Positive for arthralgias. Negative for back pain, joint swelling and myalgias.  Skin: Negative for rash.  Neurological: Negative for dizziness, syncope, light-headedness, numbness and headaches.  Hematological: Negative for adenopathy.  Psychiatric/Behavioral: Negative for agitation, confusion, sleep disturbance and suicidal ideas. The patient is not nervous/anxious.   BREAST: No symptoms   Objective: BP 110/80   Ht 5\' 4"  (1.626 m)   Wt 115 lb (52.2 kg)   LMP 11/17/2019 (Exact Date)   BMI 19.74 kg/m    Physical Exam Constitutional:      Appearance: She is well-developed.  Genitourinary:     Vulva, vagina, cervix, uterus, right adnexa and left adnexa normal.     No vulval lesion or tenderness noted.     No vaginal discharge, erythema or tenderness.     No cervical polyp.      Uterus is not enlarged or tender.     No right or left adnexal mass present.     Right adnexa not tender.     Left adnexa not tender.  Neck:     Thyroid: No thyromegaly.  Cardiovascular:     Rate and Rhythm: Normal rate and regular rhythm.     Heart sounds: Normal heart sounds. No murmur.  Pulmonary:     Effort: Pulmonary effort is normal.     Breath sounds: Normal breath sounds.  Chest:     Breasts:        Right: No mass, nipple discharge, skin change or tenderness.        Left: No mass, nipple discharge, skin change or tenderness.  Abdominal:     Palpations: Abdomen is soft.     Tenderness:  There is no abdominal tenderness. There is no guarding.  Musculoskeletal:        General: Normal range of motion.     Cervical back: Normal range of motion.  Neurological:     General: No focal deficit present.     Mental Status: She is alert and oriented to person, place, and time.     Cranial Nerves: No cranial nerve deficit.  Skin:    General: Skin is warm and dry.  Psychiatric:        Mood and Affect: Mood normal.        Behavior: Behavior normal.        Thought Content: Thought content normal.        Judgment: Judgment normal.  Vitals reviewed.     Results: Results for orders placed or performed in visit on 11/30/19 (from the past 24 hour(s))  POCT Wet Prep with KOH     Status: Normal   Collection Time: 11/30/19  2:56 PM  Result Value Ref Range   Trichomonas, UA Negative    Clue Cells Wet Prep HPF POC neg    Epithelial Wet Prep HPF POC     Yeast Wet Prep HPF POC neg    Bacteria Wet Prep HPF POC     RBC Wet Prep HPF POC     WBC Wet Prep HPF POC     KOH Prep POC Negative Negative    Assessment/Plan: Encounter for annual routine gynecological examination  Cervical cancer screening - Plan: Cytology - PAP,  Screening for STD (sexually transmitted disease) - Plan: Cytology - PAP,   Encounter for initial prescription of intrauterine contraceptive device (IUD) - Plan:  misoprostol (CYTOTEC) 100 MCG tablet; IUDs discussed, pros/cons/risks/benefits. Would like Mirena. RTO with menses for insertion. Rx cytotec/NSAIDs 1 hr before.   Vaginal discharge - Plan: POCT Wet Prep with KOH; Neg exam/wet prep. STD check today. Try repHresh. F/u if sx persist and will treat.  Dermoid cyst f/u--pt due for yearly GYN u/s. Will sched 7/21 and call pt with results.   Meds ordered this encounter  Medications  . misoprostol (CYTOTEC) 100 MCG tablet    Sig: Take 1 tablet (100 mcg total) by mouth once for 1 dose. 1 hour before appt    Dispense:  1 tablet    Refill:  0    Order Specific Question:   Supervising Provider    Answer:   Gae Dry [id number]             GYN counsel family planning choices, adequate intake of calcium and vitamin D, diet and exercise     F/U  Return in about 2 months (around 01/30/2020) for GYN u/s for cyst f/u--[facility] to call pt.; with menses for IUD insertion  Alicia B. Copland, PA-C 11/30/2019 3:18 PM

## 2019-11-29 NOTE — Patient Instructions (Signed)
I value your feedback and entrusting us with your care. If you get a [facility] patient survey, I would appreciate you taking the time to let us know about your experience today. Thank you!  As of July 01, 2019, your lab results will be released to your MyChart immediately, before I even have a chance to see them. Please give me time to review them and contact you if there are any abnormalities. Thank you for your patience.  

## 2019-11-30 ENCOUNTER — Encounter: Payer: Self-pay | Admitting: Obstetrics and Gynecology

## 2019-11-30 ENCOUNTER — Ambulatory Visit (INDEPENDENT_AMBULATORY_CARE_PROVIDER_SITE_OTHER): Payer: Medicaid Other | Admitting: Obstetrics and Gynecology

## 2019-11-30 ENCOUNTER — Other Ambulatory Visit: Payer: Self-pay

## 2019-11-30 ENCOUNTER — Other Ambulatory Visit (HOSPITAL_COMMUNITY)
Admission: RE | Admit: 2019-11-30 | Discharge: 2019-11-30 | Disposition: A | Discharge status: Home / Self Care | Payer: Medicaid Other | Source: Ambulatory Visit | Attending: Obstetrics and Gynecology | Admitting: Obstetrics and Gynecology

## 2019-11-30 VITALS — BP 110/80 | Ht 64.0 in | Wt 115.0 lb

## 2019-11-30 DIAGNOSIS — Z124 Encounter for screening for malignant neoplasm of cervix: Secondary | ICD-10-CM | POA: Diagnosis not present

## 2019-11-30 DIAGNOSIS — Z30014 Encounter for initial prescription of intrauterine contraceptive device: Secondary | ICD-10-CM

## 2019-11-30 DIAGNOSIS — N898 Other specified noninflammatory disorders of vagina: Secondary | ICD-10-CM

## 2019-11-30 DIAGNOSIS — Z113 Encounter for screening for infections with a predominantly sexual mode of transmission: Secondary | ICD-10-CM | POA: Insufficient documentation

## 2019-11-30 DIAGNOSIS — Z0000 Encounter for general adult medical examination without abnormal findings: Secondary | ICD-10-CM | POA: Diagnosis not present

## 2019-11-30 DIAGNOSIS — D369 Benign neoplasm, unspecified site: Secondary | ICD-10-CM

## 2019-11-30 DIAGNOSIS — Z01419 Encounter for gynecological examination (general) (routine) without abnormal findings: Secondary | ICD-10-CM

## 2019-11-30 LAB — POCT WET PREP WITH KOH
Clue Cells Wet Prep HPF POC: NEGATIVE
KOH Prep POC: NEGATIVE
Trichomonas, UA: NEGATIVE
Yeast Wet Prep HPF POC: NEGATIVE

## 2019-11-30 MED ORDER — MISOPROSTOL 100 MCG PO TABS: 100.0000 ug | ORAL_TABLET | Freq: Once | ORAL | 0 refills | Status: DC

## 2019-12-02 LAB — CYTOLOGY - PAP
Chlamydia: NEGATIVE
Comment: NEGATIVE
Comment: NORMAL
Diagnosis: NEGATIVE
Neisseria Gonorrhea: NEGATIVE

## 2020-01-31 ENCOUNTER — Telehealth: Payer: Self-pay | Admitting: Obstetrics and Gynecology

## 2020-01-31 ENCOUNTER — Ambulatory Visit (INDEPENDENT_AMBULATORY_CARE_PROVIDER_SITE_OTHER): Payer: Medicaid Other

## 2020-01-31 ENCOUNTER — Other Ambulatory Visit: Payer: Self-pay

## 2020-01-31 DIAGNOSIS — D369 Benign neoplasm, unspecified site: Secondary | ICD-10-CM

## 2020-01-31 NOTE — Telephone Encounter (Signed)
Pt aware of neg GYN u/s results. RTO WNL. Still plans to get IUD. F/u prn.

## 2020-11-27 ENCOUNTER — Telehealth: Payer: Self-pay | Admitting: Emergency Medicine

## 2020-11-27 NOTE — Telephone Encounter (Signed)
Attempted to contact patient, no answer and no vm set up.

## 2020-11-27 NOTE — Telephone Encounter (Addendum)
Was having abdominal pain and had swollen gallbladder and gallstones on CT scan. Seen at hospital in [place]. Was given zofran 4 mg, metronidazole 500mg , cipro 500mg  per [facility]. Medicaid do not cover due to not being a [place] provider. Schedule patient for Friday. Patient will need referral to Surgeon. Spoke to patient she will come by on 5/10 with package hospital in [place] gave her

## 2020-11-30 ENCOUNTER — Other Ambulatory Visit
Admission: RE | Admit: 2020-11-30 | Discharge: 2020-11-30 | Disposition: A | Discharge status: Home / Self Care | Payer: Medicaid Other | Source: Ambulatory Visit | Attending: Family Medicine | Admitting: Family Medicine

## 2020-11-30 ENCOUNTER — Other Ambulatory Visit: Payer: Self-pay | Admitting: Family Medicine

## 2020-11-30 DIAGNOSIS — R1011 Right upper quadrant pain: Secondary | ICD-10-CM | POA: Diagnosis not present

## 2020-11-30 LAB — COMPREHENSIVE METABOLIC PANEL
ALT: 15 U/L (ref 0–44)
AST: 16 U/L (ref 15–41)
Albumin: 4.7 g/dL (ref 3.5–5.0)
Alkaline Phosphatase: 54 U/L (ref 38–126)
Anion gap: 11 (ref 5–15)
BUN: 10 mg/dL (ref 6–20)
CO2: 25 mmol/L (ref 22–32)
Calcium: 9.5 mg/dL (ref 8.9–10.3)
Chloride: 104 mmol/L (ref 98–111)
Creatinine, Ser: 0.74 mg/dL (ref 0.44–1.00)
GFR, Estimated: >60 mL/min (ref >60)
Glucose, Bld: 96 mg/dL (ref 70–99)
Potassium: 3.5 mmol/L (ref 3.5–5.1)
Sodium: 140 mmol/L (ref 135–145)
Total Bilirubin: 0.7 mg/dL (ref 0.3–1.2)
Total Protein: 7.6 g/dL (ref 6.5–8.1)

## 2020-11-30 LAB — CBC WITH DIFFERENTIAL/PLATELET
Abs Immature Granulocytes: 0.02 10*3/uL (ref 0.00–0.07)
Basophils Absolute: 0.1 10*3/uL (ref 0.0–0.1)
Basophils Relative: 1 %
Eosinophils Absolute: 0.1 10*3/uL (ref 0.0–0.5)
Eosinophils Relative: 1 %
HCT: 38.9 % (ref 36.0–46.0)
Hemoglobin: 13 g/dL (ref 12.0–15.0)
Immature Granulocytes: 0 %
Lymphocytes Relative: 29 %
Lymphs Abs: 3.1 10*3/uL (ref 0.7–4.0)
MCH: 28.5 pg (ref 26.0–34.0)
MCHC: 33.4 g/dL (ref 30.0–36.0)
MCV: 85.3 fL (ref 80.0–100.0)
Monocytes Absolute: 0.7 10*3/uL (ref 0.1–1.0)
Monocytes Relative: 7 %
Neutro Abs: 6.6 10*3/uL (ref 1.7–7.7)
Neutrophils Relative %: 62 %
Platelets: 253 10*3/uL (ref 150–400)
RBC: 4.56 MIL/uL (ref 3.87–5.11)
RDW: 13.4 % (ref 11.5–15.5)
WBC: 10.6 10*3/uL — ABNORMAL HIGH (ref 4.0–10.5)
nRBC: 0 % (ref 0.0–0.2)

## 2020-11-30 NOTE — Progress Notes (Signed)
Name: Tiffany Guzman   MRN: [id number]    DOB: [birth year]   Date:12/01/2020       Progress Note  Subjective  Chief Complaint  Consult  HPI  Cholecystitis: she was in [place] helping her grandmother move and developed nausea, bloating and upper abdominal pain, vomiting, hot sweats. She had been evaluated with CT's and [place] in the past for similar episodes but this episode was severe and decided to go to [facility]. She was seen at [facility]. She had labs that showed elevation WBC and CT showed periportal edema with diffuse nonspecific gallbladder wall thickening or edema.She was given fluids, sent home with rx of Cipro, Metronidazole but she was not able to fill it due to Medicaid. She is feeling well since she went home, no pain, able to eat, WBC improved, Sugar, kidney and liver function tests are within normal limits   Patient Active Problem List   Diagnosis Date Noted  . Stomach ache   . Status post cesarean delivery 11/14/2017  . Dermoid cyst 10/03/2017  . 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 Anxiety 06/24/2016    Past Surgical History:  Procedure Laterality Date  . CESAREAN SECTION N/A 11/14/2017   Procedure: CESAREAN SECTION;  Surgeon: Will Bonnet, MD;  Location: [facility] ORS;  Service: Obstetrics;  Laterality: N/A;  . ESOPHAGOGASTRODUODENOSCOPY Left 02/17/2019   Procedure: ESOPHAGOGASTRODUODENOSCOPY (EGD);  Surgeon: Virgel Manifold, MD;  Location: [facility] ENDOSCOPY;  Service: Endoscopy;  Laterality: Left;  . INSERTION OF NON VAGINAL CONTRACEPTIVE DEVICE    . LAPAROSCOPIC OVARIAN CYSTECTOMY Right 02/23/2019   Procedure: LAPAROSCOPIC OVARIAN CYSTECTOMY;  Surgeon: Homero Fellers, MD;  Location: [facility] ORS;  Service: Gynecology;  Laterality: Right;  . OOPHORECTOMY Left 10/03/2017   Procedure: OOPHORECTOMY;  Surgeon: Homero Fellers, MD;  Location: [facility] ORS;  Service: Gynecology;  Laterality: Left;  . OVARIAN CYST REMOVAL Left 10/03/2017   Procedure: OVARIAN  CYSTECTOMY;  Surgeon: Homero Fellers, MD;  Location: [facility] ORS;  Service: Gynecology;  Laterality: Left;    Family History  Problem Relation Age of Onset  . Hypertension Mother   . Hypothyroidism Mother   . Depression Mother   . Diabetes Maternal Grandmother     Social History   Tobacco Use  . Smoking status: Never Smoker  . Smokeless tobacco: Never Used  Substance Use Topics  . Alcohol use: No    Alcohol/week: 0.0 standard drinks     Current Outpatient Medications:  .  esomeprazole (NEXIUM) 40 MG capsule, Take 1 capsule by mouth daily. (Patient not taking: Reported on 12/01/2020), Disp: , Rfl:  .  misoprostol (CYTOTEC) 100 MCG tablet, Take 1 tablet (100 mcg total) by mouth once for 1 dose. 1 hour before appt, Disp: 1 tablet, Rfl: 0 .  naproxen (NAPROSYN) 500 MG tablet, Take 1 tablet by mouth 2 (two) times daily. (Patient not taking: Reported on 12/01/2020), Disp: , Rfl:   Allergies  Allergen Reactions  . Buspar [Buspirone]     Dystonic reaction - arms, legs, jaw "locked up"    I personally reviewed active problem list, medication list, allergies, family history, social history, health maintenance with the patient/caregiver today.   ROS  Ten systems reviewed and is negative except as mentioned in HPI   Objective  Vitals:   12/01/20 1458  BP: 110/68  Pulse: 78  Resp: 16  Temp: 98 F (36.7 C)  TempSrc: Oral  SpO2: 96%  Weight: 111 lb 12.8 oz (50.7 kg)  Height: 5\' 4"  (1.626 m)    Body mass index is 19.19 kg/m.  Physical Exam  Constitutional: Patient appears well-developed and well-nourished.  No distress.  HEENT: head atraumatic, normocephalic, pupils equal and reactive to light, neck supple Cardiovascular: Normal rate, regular rhythm and normal heart sounds.  No murmur heard. No BLE edema. Pulmonary/Chest: Effort normal and breath sounds normal. No respiratory distress. Abdominal: Soft.  There is no tenderness. Psychiatric: Patient has a normal mood  and affect. behavior is normal. Judgment and thought content normal.  Recent Results (from the past 2160 hour(s))  CBC with Differential/Platelet     Status: Abnormal   Collection Time: 11/30/20  7:18 PM  Result Value Ref Range   WBC 10.6 (H) 4.0 - 10.5 K/uL   RBC 4.56 3.87 - 5.11 MIL/uL   Hemoglobin 13.0 12.0 - 15.0 g/dL   HCT 38.9 36.0 - 46.0 %   MCV 85.3 80.0 - 100.0 fL   MCH 28.5 26.0 - 34.0 pg   MCHC 33.4 30.0 - 36.0 g/dL   RDW 13.4 11.5 - 15.5 %   Platelets 253 150 - 400 K/uL   nRBC 0.0 0.0 - 0.2 %   Neutrophils Relative % 62 %   Neutro Abs 6.6 1.7 - 7.7 K/uL   Lymphocytes Relative 29 %   Lymphs Abs 3.1 0.7 - 4.0 K/uL   Monocytes Relative 7 %   Monocytes Absolute 0.7 0.1 - 1.0 K/uL   Eosinophils Relative 1 %   Eosinophils Absolute 0.1 0.0 - 0.5 K/uL   Basophils Relative 1 %   Basophils Absolute 0.1 0.0 - 0.1 K/uL   Immature Granulocytes 0 %   Abs Immature Granulocytes 0.02 0.00 - 0.07 K/uL    Comment: Performed at [facility], [address] [place], [place] [address]  Comprehensive metabolic panel     Status: None   Collection Time: 11/30/20  7:18 PM  Result Value Ref Range   Sodium 140 135 - 145 mmol/L   Potassium 3.5 3.5 - 5.1 mmol/L   Chloride 104 98 - 111 mmol/L   CO2 25 22 - 32 mmol/L   Glucose, Bld 96 70 - 99 mg/dL    Comment: Glucose reference range applies only to samples taken after fasting for at least 8 hours.   BUN 10 6 - 20 mg/dL   Creatinine, Ser 0.74 0.44 - 1.00 mg/dL   Calcium 9.5 8.9 - 10.3 mg/dL   Total Protein 7.6 6.5 - 8.1 g/dL   Albumin 4.7 3.5 - 5.0 g/dL   AST 16 15 - 41 U/L   ALT 15 0 - 44 U/L   Alkaline Phosphatase 54 38 - 126 U/L   Total Bilirubin 0.7 0.3 - 1.2 mg/dL   GFR, Estimated >60 >60 mL/min    Comment: (NOTE) Calculated using the CKD-EPI Creatinine Equation (2021)    Anion gap 11 5 - 15    Comment: Performed at [facility], [address] [place], [place] [address]      PHQ2/9: Depression screen  [facility] 2/9 12/01/2020 11/15/2019 10/06/2019 08/11/2019 07/20/2019  Decreased Interest 0 2 0 3 3  Down, Depressed, Hopeless 0 1 2 3 2   PHQ - 2 Score 0 3 2 6 5   Altered sleeping - 1 3 3 1   Tired, decreased energy - 2 1 3 1   Change in appetite - 2 2 3 3   Feeling bad or failure about yourself  - 2 1 3 2   Trouble concentrating - 2  1 3 3   Moving slowly or fidgety/restless - 1 0 1 1  Suicidal thoughts - 0 0 0 0  PHQ-9 Score - 13 10 22 16   Difficult doing work/chores - Somewhat difficult Not difficult at all Extremely dIfficult Very difficult    phq 9 is negative  Fall Risk: Fall Risk  12/01/2020 11/15/2019 10/06/2019 08/11/2019 07/20/2019  Falls in the past year? 0 0 0 0 0  Number falls in past yr: 0 0 0 0 0  Injury with Fall? 0 0 0 0 0  Risk for fall due to : - - - - -  Follow up Falls evaluation completed - - Falls evaluation completed Falls evaluation completed     Functional Status Survey: Is the patient deaf or have difficulty hearing?: No Does the patient have difficulty seeing, even when wearing glasses/contacts?: No Does the patient have difficulty concentrating, remembering, or making decisions?: No Does the patient have difficulty walking or climbing stairs?: No Does the patient have difficulty dressing or bathing?: No Does the patient have difficulty doing errands alone such as visiting a doctor's office or shopping?: No    Assessment & Plan  1. Cholecystitis  - Ambulatory referral to General Surgery  She is doing well today, discussed importance of continuing bland diet, and go to [facility] if recurrence of symptoms She never started antibiotics and is doing well so we will not send it today  Reviewed labs done at [facility] and also yesterday

## 2020-11-30 NOTE — Telephone Encounter (Signed)
Notified to get stat blood work at hospital.  Can not go til 7:00 tonight

## 2020-11-30 NOTE — Telephone Encounter (Signed)
-----   Message from Steele Sizer, MD sent at 11/30/2020  1:34 PM EDT ----- Regarding: labs She went to [facility] [place] of [place], she is coming to see me tomorrow, please ask her to go to [facility] to have labs done . I will need results tomorrow before her visit

## 2020-12-01 ENCOUNTER — Encounter: Payer: Self-pay | Admitting: Family Medicine

## 2020-12-01 ENCOUNTER — Other Ambulatory Visit: Payer: Self-pay

## 2020-12-01 ENCOUNTER — Ambulatory Visit: Payer: Medicaid Other | Admitting: Family Medicine

## 2020-12-01 VITALS — BP 110/68 | HR 78 | Temp 98.0°F | Resp 16 | Ht 64.0 in | Wt 111.8 lb

## 2020-12-01 DIAGNOSIS — K819 Cholecystitis, unspecified: Secondary | ICD-10-CM | POA: Diagnosis not present

## 2020-12-04 ENCOUNTER — Ambulatory Visit: Payer: Medicaid Other | Admitting: Obstetrics and Gynecology

## 2020-12-04 ENCOUNTER — Encounter: Payer: Self-pay | Admitting: Family Medicine

## 2020-12-04 NOTE — Patient Instructions (Incomplete)
I value your feedback and you entrusting us with your care. If you get a [facility] patient survey, I would appreciate you taking the time to let us know about your experience today. Thank you! ? ? ?

## 2020-12-04 NOTE — Progress Notes (Deleted)
PCP:  Hubbard Hartshorn, FNP   No chief complaint on file.    HPI:      [name] is a 25 y.o. G1P1001 who LMP was Patient's last menstrual period was 11/05/2020., presents today for her annual examination.  Her menses are regular every 28-30 days, lasting 4-5 days.  Dysmenorrhea mild, occurring first 1-2 days of flow. She does not have intermenstrual bleeding.  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 Gilman Schmidt. This was her 2nd one and she recommends yearly GYN u/s, especially since pt is s/p LSO. Had neg GYN u/s 7/21 and neg pelvic CT scan 5/22 (for RUQ pain). Noted to have gallbladder thickening.   Sex activity: single partner, contraception - none. Would like IUD.  Last Pap: 11/30/19  Results were: no abnormalities   Had vaginal itching last wk. Then developed increased d/c with occas fishy odor. No meds to treat. No prior abx use. Uses scented soaps.   There is no FH of breast cancer. There is no FH of ovarian cancer. The patient does do self-breast exams.  Tobacco use: vapes daily Alcohol use: none No drug use.  Exercise: not active  She does not get adequate calcium and Vitamin D in her diet.   Past Medical History:  Diagnosis Date  . Acne   . Anxiety   . Attention deficit hyperactivity disorder (ADHD)   . Chronic upper back pain    lower and upper back pain  . Depression   . Dyspnea   . GERD (gastroesophageal reflux disease)   . Gestational hypertension 11/14/2017  . Migraine with aura    since elementary school    Past Surgical History:  Procedure Laterality Date  . CESAREAN SECTION N/A 11/14/2017   Procedure: CESAREAN SECTION;  Surgeon: Will Bonnet, MD;  Location: [facility] ORS;  Service: Obstetrics;  Laterality: N/A;  . ESOPHAGOGASTRODUODENOSCOPY Left 02/17/2019   Procedure: ESOPHAGOGASTRODUODENOSCOPY (EGD);  Surgeon: Virgel Manifold, MD;  Location: [facility] ENDOSCOPY;  Service: Endoscopy;  Laterality: Left;  . INSERTION OF NON  VAGINAL CONTRACEPTIVE DEVICE    . LAPAROSCOPIC OVARIAN CYSTECTOMY Right 02/23/2019   Procedure: LAPAROSCOPIC OVARIAN CYSTECTOMY;  Surgeon: Homero Fellers, MD;  Location: [facility] ORS;  Service: Gynecology;  Laterality: Right;  . OOPHORECTOMY Left 10/03/2017   Procedure: OOPHORECTOMY;  Surgeon: Homero Fellers, MD;  Location: [facility] ORS;  Service: Gynecology;  Laterality: Left;  . OVARIAN CYST REMOVAL Left 10/03/2017   Procedure: OVARIAN CYSTECTOMY;  Surgeon: Homero Fellers, MD;  Location: [facility] ORS;  Service: Gynecology;  Laterality: Left;    Family History  Problem Relation Age of Onset  . Hypertension Mother   . Hypothyroidism Mother   . Depression Mother   . Diabetes Maternal Grandmother     Social History   Socioeconomic History  . Marital status: Single    Spouse name: Not on file  . Number of children: Not on file  . Years of education: Not on file  . Highest education level: Not on file  Occupational History  . Not on file  Tobacco Use  . Smoking status: Never Smoker  . Smokeless tobacco: Never Used  Vaping Use  . Vaping Use: Every day  . Start date: 09/20/2019  . Substances: Nicotine  Substance and Sexual Activity  . Alcohol use: No    Alcohol/week: 0.0 standard drinks  . Drug use: Yes    Frequency: 7.0 times per week    Types: Marijuana  Comment: has stopped for 10 days  . Sexual activity: Yes    Partners: Male    Birth control/protection: None  Other Topics Concern  . Not on file  Social History Narrative  . Not on file   Social Determinants of Health   Financial Resource Strain: Not on file  Food Insecurity: Not on file  Transportation Needs: Not on file  Physical Activity: Not on file  Stress: Not on file  Social Connections: Not on file  Intimate Partner Violence: Not on file     Current Outpatient Medications:  .  esomeprazole (NEXIUM) 40 MG capsule, Take 1 capsule by mouth daily. (Patient not taking: Reported on 12/01/2020), Disp: ,  Rfl:  .  misoprostol (CYTOTEC) 100 MCG tablet, Take 1 tablet (100 mcg total) by mouth once for 1 dose. 1 hour before appt, Disp: 1 tablet, Rfl: 0 .  naproxen (NAPROSYN) 500 MG tablet, Take 1 tablet by mouth 2 (two) times daily. (Patient not taking: Reported on 12/01/2020), Disp: , Rfl:      ROS:  Review of Systems  Constitutional: Negative for fatigue, fever and unexpected weight change.  Respiratory: Negative for cough, shortness of breath and wheezing.   Cardiovascular: Negative for chest pain, palpitations and leg swelling.  Gastrointestinal: Negative for blood in stool, constipation, diarrhea, nausea and vomiting.  Endocrine: Negative for cold intolerance, heat intolerance and polyuria.  Genitourinary: Positive for vaginal discharge. Negative for dyspareunia, dysuria, flank pain, frequency, genital sores, hematuria, menstrual problem, pelvic pain, urgency, vaginal bleeding and vaginal pain.  Musculoskeletal: Positive for arthralgias. Negative for back pain, joint swelling and myalgias.  Skin: Negative for rash.  Neurological: Negative for dizziness, syncope, light-headedness, numbness and headaches.  Hematological: Negative for adenopathy.  Psychiatric/Behavioral: Negative for agitation, confusion, sleep disturbance and suicidal ideas. The patient is not nervous/anxious.   BREAST: No symptoms   Objective: LMP 11/05/2020    Physical Exam Constitutional:      Appearance: She is well-developed.  Genitourinary:     Vulva normal.     No vaginal discharge, erythema or tenderness.      Right Adnexa: not tender and no mass present.    Left Adnexa: not tender and no mass present.    No cervical polyp.     Uterus is not enlarged or tender.  Breasts:     Right: No mass, nipple discharge, skin change or tenderness.     Left: No mass, nipple discharge, skin change or tenderness.    Neck:     Thyroid: No thyromegaly.  Cardiovascular:     Rate and Rhythm: Normal rate and regular  rhythm.     Heart sounds: Normal heart sounds. No murmur heard.   Pulmonary:     Effort: Pulmonary effort is normal.     Breath sounds: Normal breath sounds.  Abdominal:     Palpations: Abdomen is soft.     Tenderness: There is no abdominal tenderness. There is no guarding.  Musculoskeletal:        General: Normal range of motion.     Cervical back: Normal range of motion.  Neurological:     General: No focal deficit present.     Mental Status: She is alert and oriented to person, place, and time.     Cranial Nerves: No cranial nerve deficit.  Skin:    General: Skin is warm and dry.  Psychiatric:        Mood and Affect: Mood normal.  Behavior: Behavior normal.        Thought Content: Thought content normal.        Judgment: Judgment normal.  Vitals reviewed.     Results: No results found for this or any previous visit (from the past 24 hour(s)).  Assessment/Plan: Encounter for annual routine gynecological examination  Cervical cancer screening - Plan: Cytology - PAP,  Screening for STD (sexually transmitted disease) - Plan: Cytology - PAP,   Encounter for initial prescription of intrauterine contraceptive device (IUD) - Plan: misoprostol (CYTOTEC) 100 MCG tablet; IUDs discussed, pros/cons/risks/benefits. Would like Mirena. RTO with menses for insertion. Rx cytotec/NSAIDs 1 hr before.   Vaginal discharge - Plan: POCT Wet Prep with KOH; Neg exam/wet prep. STD check today. Try repHresh. F/u if sx persist and will treat.  Dermoid cyst f/u--pt due for yearly GYN u/s. Will sched 7/21 and call pt with results.   No orders of the defined types were placed in this encounter.            GYN counsel family planning choices, adequate intake of calcium and vitamin D, diet and exercise     F/U  No follow-ups on file.; with menses for IUD insertion  Mani Celestin B. Junnie Loschiavo, PA-C 12/04/2020 10:50 AM

## 2020-12-12 ENCOUNTER — Encounter: Payer: Self-pay | Admitting: Obstetrics and Gynecology

## 2020-12-12 ENCOUNTER — Ambulatory Visit (INDEPENDENT_AMBULATORY_CARE_PROVIDER_SITE_OTHER): Payer: Medicaid Other | Admitting: Obstetrics and Gynecology

## 2020-12-12 ENCOUNTER — Other Ambulatory Visit: Payer: Self-pay

## 2020-12-12 ENCOUNTER — Other Ambulatory Visit (HOSPITAL_COMMUNITY)
Admission: RE | Admit: 2020-12-12 | Discharge: 2020-12-12 | Disposition: A | Discharge status: Home / Self Care | Payer: Medicaid Other | Source: Ambulatory Visit | Attending: Obstetrics and Gynecology | Admitting: Obstetrics and Gynecology

## 2020-12-12 VITALS — BP 110/70 | Ht 64.0 in | Wt 114.0 lb

## 2020-12-12 DIAGNOSIS — D271 Benign neoplasm of left ovary: Secondary | ICD-10-CM

## 2020-12-12 DIAGNOSIS — Z113 Encounter for screening for infections with a predominantly sexual mode of transmission: Secondary | ICD-10-CM

## 2020-12-12 DIAGNOSIS — Z30014 Encounter for initial prescription of intrauterine contraceptive device: Secondary | ICD-10-CM | POA: Diagnosis not present

## 2020-12-12 DIAGNOSIS — N926 Irregular menstruation, unspecified: Secondary | ICD-10-CM | POA: Diagnosis not present

## 2020-12-12 DIAGNOSIS — Z01419 Encounter for gynecological examination (general) (routine) without abnormal findings: Secondary | ICD-10-CM | POA: Diagnosis not present

## 2020-12-12 LAB — POCT URINE PREGNANCY: Preg Test, Ur: NEGATIVE

## 2020-12-12 MED ORDER — MISOPROSTOL 100 MCG PO TABS: 100.0000 ug | ORAL_TABLET | Freq: Once | ORAL | 0 refills | Status: DC

## 2020-12-12 NOTE — Progress Notes (Signed)
PCP:  Hubbard Hartshorn, FNP   Chief Complaint  Patient presents with  . Gynecologic Exam    No concerns     HPI:      [name] is a 25 y.o. G1P1001 who LMP was Patient's last menstrual period was 11/03/2020 (approximate)., presents today for her annual examination.  Her menses are regular every 28-30 days, lasting 5 days.  Dysmenorrhea mod, occurring first 1-2 days of flow. She does not have intermenstrual bleeding.  Hx of lap RT cystectomy for dermoid cyst 7/20 with Dr. Gilman Schmidt. This was her 2nd one and she recommends yearly GYN u/s, especially since pt is s/p LSO for dermoid 3/19. Had neg GYN u/s 7/21 and neg pelvic CT scan 5/22 (for RUQ pain). Noted to have gallbladder thickening, has gen surg appt tomorrow to discuss lap chole.   Sex activity: single partner, contraception - none. Would like IUD but never did last yr. Still interested. Menses is 1 wk late this cycle, no UPT done.  Last Pap: 11/30/19  Results were: no abnormalities   There is no FH of breast cancer. There is no FH of ovarian cancer. The patient does do self-breast exams.  Tobacco use: vapes daily Alcohol use: none Marijuana use daily Exercise: min active  She does not get adequate calcium or Vitamin D in her diet.   Past Medical History:  Diagnosis Date  . Acne   . Anxiety   . Attention deficit hyperactivity disorder (ADHD)   . Chronic upper back pain    lower and upper back pain  . Depression   . Dyspnea   . GERD (gastroesophageal reflux disease)   . Gestational hypertension 11/14/2017  . Migraine with aura    since elementary school    Past Surgical History:  Procedure Laterality Date  . CESAREAN SECTION N/A 11/14/2017   Procedure: CESAREAN SECTION;  Surgeon: Will Bonnet, MD;  Location: [facility] ORS;  Service: Obstetrics;  Laterality: N/A;  . ESOPHAGOGASTRODUODENOSCOPY Left 02/17/2019   Procedure: ESOPHAGOGASTRODUODENOSCOPY (EGD);  Surgeon: Virgel Manifold, MD;  Location: [facility]  ENDOSCOPY;  Service: Endoscopy;  Laterality: Left;  . INSERTION OF NON VAGINAL CONTRACEPTIVE DEVICE    . LAPAROSCOPIC OVARIAN CYSTECTOMY Right 02/23/2019   Procedure: LAPAROSCOPIC OVARIAN CYSTECTOMY;  Surgeon: Homero Fellers, MD;  Location: [facility] ORS;  Service: Gynecology;  Laterality: Right;  . OOPHORECTOMY Left 10/03/2017   Procedure: OOPHORECTOMY;  Surgeon: Homero Fellers, MD;  Location: [facility] ORS;  Service: Gynecology;  Laterality: Left;  . OVARIAN CYST REMOVAL Left 10/03/2017   Procedure: OVARIAN CYSTECTOMY;  Surgeon: Homero Fellers, MD;  Location: [facility] ORS;  Service: Gynecology;  Laterality: Left;    Family History  Problem Relation Age of Onset  . Hypertension Mother   . Hypothyroidism Mother   . Depression Mother   . Diabetes Maternal Grandmother     Social History   Socioeconomic History  . Marital status: Single    Spouse name: Not on file  . Number of children: Not on file  . Years of education: Not on file  . Highest education level: Not on file  Occupational History  . Not on file  Tobacco Use  . Smoking status: Never Smoker  . Smokeless tobacco: Never Used  Vaping Use  . Vaping Use: Every day  . Start date: 09/20/2019  . Substances: Nicotine  Substance and Sexual Activity  . Alcohol use: No    Alcohol/week: 0.0 standard drinks  . Drug use: Yes  Frequency: 7.0 times per week    Types: Marijuana    Comment: has stopped for 10 days  . Sexual activity: Yes    Partners: Male    Birth control/protection: None  Other Topics Concern  . Not on file  Social History Narrative  . Not on file   Social Determinants of Health   Financial Resource Strain: Not on file  Food Insecurity: Not on file  Transportation Needs: Not on file  Physical Activity: Not on file  Stress: Not on file  Social Connections: Not on file  Intimate Partner Violence: Not on file     Current Outpatient Medications:  .  misoprostol (CYTOTEC) 100 MCG tablet, Take 1  tablet (100 mcg total) by mouth once for 1 dose. 1 hour before appt, Disp: 1 tablet, Rfl: 0     ROS:  Review of Systems  Constitutional: Negative for fatigue, fever and unexpected weight change.  Respiratory: Negative for cough, shortness of breath and wheezing.   Cardiovascular: Negative for chest pain, palpitations and leg swelling.  Gastrointestinal: Negative for blood in stool, constipation, diarrhea, nausea and vomiting.  Endocrine: Negative for cold intolerance, heat intolerance and polyuria.  Genitourinary: Negative for dyspareunia, dysuria, flank pain, frequency, genital sores, hematuria, menstrual problem, pelvic pain, urgency, vaginal bleeding, vaginal discharge and vaginal pain.  Musculoskeletal: Positive for arthralgias. Negative for back pain, joint swelling and myalgias.  Skin: Negative for rash.  Neurological: Negative for dizziness, syncope, light-headedness, numbness and headaches.  Hematological: Negative for adenopathy.  Psychiatric/Behavioral: Negative for agitation, confusion, sleep disturbance and suicidal ideas. The patient is not nervous/anxious.   BREAST: No symptoms   Objective: BP 110/70   Ht 5\' 4"  (1.626 m)   Wt 114 lb (51.7 kg)   LMP 11/03/2020 (Approximate)   BMI 19.57 kg/m    Physical Exam Constitutional:      Appearance: She is well-developed.  Genitourinary:     Vulva normal.     Right Labia: No rash, tenderness or lesions.    Left Labia: No tenderness, lesions or rash.    No vaginal discharge, erythema or tenderness.      Right Adnexa: not tender and no mass present.    Left Adnexa: not tender and no mass present.    No cervical friability or polyp.     Uterus is not enlarged or tender.  Breasts:     Right: No mass, nipple discharge, skin change or tenderness.     Left: No mass, nipple discharge, skin change or tenderness.    Neck:     Thyroid: No thyromegaly.  Cardiovascular:     Rate and Rhythm: Normal rate and regular rhythm.      Heart sounds: Normal heart sounds. No murmur heard.   Pulmonary:     Effort: Pulmonary effort is normal.     Breath sounds: Normal breath sounds.  Abdominal:     Palpations: Abdomen is soft.     Tenderness: There is no abdominal tenderness. There is no guarding or rebound.  Musculoskeletal:        General: Normal range of motion.     Cervical back: Normal range of motion.  Lymphadenopathy:     Cervical: No cervical adenopathy.  Neurological:     General: No focal deficit present.     Mental Status: She is alert and oriented to person, place, and time.     Cranial Nerves: No cranial nerve deficit.  Skin:    General: Skin is warm and dry.  Psychiatric:        Mood and Affect: Mood normal.        Behavior: Behavior normal.        Thought Content: Thought content normal.        Judgment: Judgment normal.  Vitals reviewed.     Results: Results for orders placed or performed in visit on 12/12/20 (from the past 24 hour(s))  POCT urine pregnancy     Status: Normal   Collection Time: 12/12/20  4:36 PM  Result Value Ref Range   Preg Test, Ur Negative Negative    Assessment/Plan: Encounter for annual routine gynecological examination  Screening for STD (sexually transmitted disease) - Plan: Cervicovaginal ancillary only  Encounter for initial prescription of intrauterine contraceptive device (IUD) - Plan: misoprostol (CYTOTEC) 100 MCG tablet; IUD discussed, pt still interested. RTO with menses for Mirena. NSAIDs/cytotec 1 hr before appt.   Late menses - Plan: POCT urine pregnancy; neg UPT today. May be due to stress. F/u prn.   Dermoid cyst of right ovary--had neg CT scan 5/22. Due for yearly GYN u/s 7/23 since had recent imaging.   Meds ordered this encounter  Medications  . misoprostol (CYTOTEC) 100 MCG tablet    Sig: Take 1 tablet (100 mcg total) by mouth once for 1 dose. 1 hour before appt    Dispense:  1 tablet    Refill:  0    Order Specific Question:   Supervising  Provider    Answer:   Gae Dry [[id number]]             GYN counsel adequate intake of calcium and vitamin D, diet and exercise     F/U  Return in about 1 year (around 12/12/2021).; with menses for IUD insertion  Jerris Fleer B. Marcanthony Sleight, PA-C 12/12/2020 4:36 PM

## 2020-12-12 NOTE — Patient Instructions (Signed)
I value your feedback and you entrusting us with your care. If you get a [facility] patient survey, I would appreciate you taking the time to let us know about your experience today. Thank you! ? ? ?

## 2020-12-13 ENCOUNTER — Ambulatory Visit (INDEPENDENT_AMBULATORY_CARE_PROVIDER_SITE_OTHER): Payer: Medicaid Other | Admitting: Surgery

## 2020-12-13 ENCOUNTER — Encounter: Payer: Self-pay | Admitting: Surgery

## 2020-12-13 VITALS — BP 106/71 | HR 63 | Temp 98.6°F | Ht 64.0 in | Wt 112.0 lb

## 2020-12-13 DIAGNOSIS — K810 Acute cholecystitis: Secondary | ICD-10-CM | POA: Diagnosis not present

## 2020-12-13 NOTE — Patient Instructions (Signed)
You have requested to have your gallbladder removed. This will be done at [facility] with Dr. Hampton Abbot.  You will most likely be out of work 1-2 weeks for this surgery. You will return after your post-op appointment with a lifting restriction for approximately 4 more weeks.  You will be able to eat anything you would like to following surgery. But, start by eating a bland diet and advance this as tolerated. The Gallbladder diet is below, please go as closely by this diet as possible prior to surgery to avoid any further attacks.  Please see the (blue)pre-care form that you have been given today. Our surgery scheduler will call you to look at surgery dates and to go over information.    If you have any questions, please call our office.  Laparoscopic Cholecystectomy Laparoscopic cholecystectomy is surgery to remove the gallbladder. The gallbladder is located in the upper right part of the abdomen, behind the liver. It is a storage sac for bile, which is produced in the liver. Bile aids in the digestion and absorption of fats. Cholecystectomy is often done for inflammation of the gallbladder (cholecystitis). This condition is usually caused by a buildup of gallstones (cholelithiasis) in the gallbladder. Gallstones can block the flow of bile, and that can result in inflammation and pain. In severe cases, emergency surgery may be required. If emergency surgery is not required, you will have time to prepare for the procedure. Laparoscopic surgery is an alternative to open surgery. Laparoscopic surgery has a shorter recovery time. Your common bile duct may also need to be examined during the procedure. If stones are found in the common bile duct, they may be removed. LET [facility] CARE PROVIDER KNOW ABOUT:  Any allergies you have.  All medicines you are taking, including vitamins, herbs, eye drops, creams, and over-the-counter medicines.  Previous problems you or members of your family have had  with the use of anesthetics.  Any blood disorders you have.  Previous surgeries you have had.    Any medical conditions you have. RISKS AND COMPLICATIONS Generally, this is a safe procedure. However, problems may occur, including:  Infection.  Bleeding.  Allergic reactions to medicines.  Damage to other structures or organs.  A stone remaining in the common bile duct.  A bile leak from the cyst duct that is clipped when your gallbladder is removed.  The need to convert to open surgery, which requires a larger incision in the abdomen. This may be necessary if your surgeon thinks that it is not safe to continue with a laparoscopic procedure. BEFORE THE PROCEDURE  Ask your health care provider about:  Changing or stopping your regular medicines. This is especially important if you are taking diabetes medicines or blood thinners.  Taking medicines such as aspirin and ibuprofen. These medicines can thin your blood. Do not take these medicines before your procedure if your health care provider instructs you not to.  Follow instructions from your health care provider about eating or drinking restrictions.  Let your health care provider know if you develop a cold or an infection before surgery.  Plan to have someone take you home after the procedure.  Ask your health care provider how your surgical site will be marked or identified.  You may be given antibiotic medicine to help prevent infection. PROCEDURE  To reduce your risk of infection:  Your health care team will wash or sanitize their hands.  Your skin will be washed with soap.  An IV tube  may be inserted into one of your veins.  You will be given a medicine to make you fall asleep (general anesthetic).  A breathing tube will be placed in your mouth.  The surgeon will make several small cuts (incisions) in your abdomen.  A thin, lighted tube (laparoscope) that has a tiny camera on the end will be inserted  through one of the small incisions. The camera on the laparoscope will send a picture to a TV screen (monitor) in the operating room. This will give the surgeon a good view inside your abdomen.  A gas will be pumped into your abdomen. This will expand your abdomen to give the surgeon more room to perform the surgery.  Other tools that are needed for the procedure will be inserted through the other incisions. The gallbladder will be removed through one of the incisions.  After your gallbladder has been removed, the incisions will be closed with stitches (sutures), staples, or skin glue.  Your incisions may be covered with a bandage (dressing). The procedure may vary among health care providers and hospitals. AFTER THE PROCEDURE  Your blood pressure, heart rate, breathing rate, and blood oxygen level will be monitored often until the medicines you were given have worn off.  You will be given medicines as needed to control your pain.   This information is not intended to replace advice given to you by your health care provider. Make sure you discuss any questions you have with your health care provider.   Document Released: 07/08/2005 Document Revised: 03/29/2015 Document Reviewed: 02/17/2013 Elsevier Interactive Patient Education 2016 [place] Diet for Gallbladder Conditions A low-fat diet can be helpful if you have pancreatitis or a gallbladder condition. With these conditions, your pancreas and gallbladder have trouble digesting fats. A healthy eating plan with less fat will help rest your pancreas and gallbladder and reduce your symptoms. WHAT DO I NEED TO KNOW ABOUT THIS DIET?  Eat a low-fat diet.  Reduce your fat intake to less than 20-30% of your total daily calories. This is less than 50-60 g of fat per day.  Remember that you need some fat in your diet. Ask your dietician what your daily goal should be.  Choose nonfat and low-fat healthy foods. Look for the words  "nonfat," "low fat," or "fat free."  As a guide, look on the label and choose foods with less than 3 g of fat per serving. Eat only one serving.  Avoid alcohol.  Do not smoke. If you need help quitting, talk with your health care provider.  Eat small frequent meals instead of three large heavy meals. WHAT FOODS CAN I EAT? Grains Include healthy grains and starches such as potatoes, wheat bread, fiber-rich cereal, and brown rice. Choose whole grain options whenever possible. In adults, whole grains should account for 45-65% of your daily calories.  Fruits and Vegetables Eat plenty of fruits and vegetables. Fresh fruits and vegetables add fiber to your diet. Meats and Other Protein Sources Eat lean meat such as chicken and pork. Trim any fat off of meat before cooking it. Eggs, fish, and beans are other sources of protein. In adults, these foods should account for 10-35% of your daily calories. Dairy Choose low-fat milk and dairy options. Dairy includes fat and protein, as well as calcium.  Fats and Oils Limit high-fat foods such as fried foods, sweets, baked goods, sugary drinks.  Other Creamy sauces and condiments, such as mayonnaise, can add extra fat. Think  about whether or not you need to use them, or use smaller amounts or low fat options. WHAT FOODS ARE NOT RECOMMENDED?  High fat foods, such as:  [employer].  Ice cream.  [place] toast.  Sweet rolls.  Pizza.  Cheese bread.  Foods covered with batter, butter, creamy sauces, or cheese.  Fried foods.  Sugary drinks and desserts.  Foods that cause gas or bloating   This information is not intended to replace advice given to you by your health care provider. Make sure you discuss any questions you have with your health care provider.   Document Released: 07/13/2013 Document Reviewed: 07/13/2013 Elsevier Interactive Patient Education [employer].

## 2020-12-13 NOTE — Progress Notes (Signed)
12/13/2020  Reason for Visit:  Acute cholecystitis  Referring Provider:  Steele Sizer, MD  History of Present Illness: Tiffany Guzman is a 25 y.o. female presenting for evaluation of cholecystitis.  The patient presented to an outside hospital in [place] on 12/04/2020 with upper abdominal pain, nausea, and vomiting.  She had a CT scan of the abdomen pelvis which showed periportal edema with diffuse nonspecific gallbladder wall thickening or edema.  She had a white blood cell count of 15.4, with normal LFTs.  She was given a dose of IV Cipro and Flagyl and discharged home.  She was not able to fill the prescriptions for oral antibiotics and she called her primary care provider to ask for a new prescription for antibiotics but by then she was feeling much better and no antibiotics were needed.  She also had a repeat lab work which showed a normalizing white blood cell count.  She reports that since that visit to the emergency room, she has been doing well with no further nausea or vomiting and has been able to tolerate diet.  Thinking back, she feels that she has had multiple episodes like this although with less severity in the past over the last few years.  She initially thought that this was related to marijuana use and quit smoking for almost a year without any change in her symptoms.  Now she is realizing that this is more related to her gallbladder.  She is interested in surgery.  Currently denies any fevers, chills, chest pain, shortness of breath, nausea, vomiting.  Past Medical History: Past Medical History:  Diagnosis Date  . Acne   . Anxiety   . Attention deficit hyperactivity disorder (ADHD)   . Chronic upper back pain    lower and upper back pain  . Depression   . Dyspnea   . GERD (gastroesophageal reflux disease)   . Gestational hypertension 11/14/2017  . Migraine with aura    since elementary school     Past Surgical History: Past Surgical History:  Procedure  Laterality Date  . CESAREAN SECTION N/A 11/14/2017   Procedure: CESAREAN SECTION;  Surgeon: Will Bonnet, MD;  Location: [facility] ORS;  Service: Obstetrics;  Laterality: N/A;  . ESOPHAGOGASTRODUODENOSCOPY Left 02/17/2019   Procedure: ESOPHAGOGASTRODUODENOSCOPY (EGD);  Surgeon: Virgel Manifold, MD;  Location: [facility] ENDOSCOPY;  Service: Endoscopy;  Laterality: Left;  . INSERTION OF NON VAGINAL CONTRACEPTIVE DEVICE    . LAPAROSCOPIC OVARIAN CYSTECTOMY Right 02/23/2019   Procedure: LAPAROSCOPIC OVARIAN CYSTECTOMY;  Surgeon: Homero Fellers, MD;  Location: [facility] ORS;  Service: Gynecology;  Laterality: Right;  . OOPHORECTOMY Left 10/03/2017   Procedure: OOPHORECTOMY;  Surgeon: Homero Fellers, MD;  Location: [facility] ORS;  Service: Gynecology;  Laterality: Left;  . OVARIAN CYST REMOVAL Left 10/03/2017   Procedure: OVARIAN CYSTECTOMY;  Surgeon: Homero Fellers, MD;  Location: [facility] ORS;  Service: Gynecology;  Laterality: Left;    Home Medications: Prior to Admission medications   Medication Sig Start Date End Date Taking? Authorizing Provider  misoprostol (CYTOTEC) 100 MCG tablet Take 1 tablet (100 mcg total) by mouth once for 1 dose. 1 hour before appt 12/12/20 4/00/86  Copland, Deirdre Evener, PA-C    Allergies: Allergies  Allergen Reactions  . Buspar [Buspirone]     Dystonic reaction - arms, legs, jaw "locked up"    Social History:  reports that she has never smoked. She has never used smokeless tobacco. She reports current drug use. Frequency: 7.00 times per week. Drug:  Marijuana. She reports that she does not drink alcohol.   Family History: Family History  Problem Relation Age of Onset  . Hypertension Mother   . Hypothyroidism Mother   . Depression Mother   . Diabetes Maternal Grandmother     Review of Systems: Review of Systems  Constitutional: Negative for chills and fever.  HENT: Negative for hearing loss.   Respiratory: Negative for shortness of breath.    Cardiovascular: Negative for chest pain.  Gastrointestinal: Negative for abdominal pain, nausea and vomiting.  Genitourinary: Negative for dysuria.  Musculoskeletal: Negative for myalgias.  Skin: Negative for rash.  Neurological: Negative for dizziness.  Psychiatric/Behavioral: Negative for depression.    Physical Exam BP 106/71   Pulse 63   Temp 98.6 F (37 C)   Ht 5\' 4"  (1.626 m)   Wt 112 lb (50.8 kg)   LMP 11/01/2020   SpO2 98%   BMI 19.22 kg/m  CONSTITUTIONAL: No acute distress, well-nourished HEENT:  Normocephalic, atraumatic, extraocular motion intact. NECK: Trachea is midline, and there is no jugular venous distension.  RESPIRATORY:  Lungs are clear, and breath sounds are equal bilaterally. Normal respiratory effort without pathologic use of accessory muscles. CARDIOVASCULAR: Heart is regular without murmurs, gallops, or rubs. GI: The abdomen is soft, nondistended, with minimal discomfort in the epigastric area.  Negative Murphy's sign.  MUSCULOSKELETAL:  Normal muscle strength and tone in all four extremities.  No peripheral edema or cyanosis. SKIN: Skin turgor is normal. There are no pathologic skin lesions.  NEUROLOGIC:  Motor and sensation is grossly normal.  Cranial nerves are grossly intact. PSYCH:  Alert and oriented to person, place and time. Affect is normal.  Laboratory Analysis: Labs from 11/30/2020: Sodium 140, potassium 3.5, chloride 104, CO2 25, BUN 10, creatinine 0.74.  Total bilirubin 0.7, AST 16, ALT 15, alkaline phosphatase 54.  WBC 10.6, hemoglobin 13, hematocrit 38.9, platelets 253.  Imaging: CT scan of abdomen and pelvis on 11/24/2020: Impression: Periportal edema with diffuse nonspecific gallbladder wall thickening or edema.   Assessment and Plan: This is a 25 y.o. female with likely history of acute cholecystitis earlier this month, now resolved.  - Discussed with the patient that likely her symptoms in the past were related to her gallbladder  particularly with no improvement after stopping marijuana use.  Her CT scan from outside hospital did show periportal edema with gallbladder wall thickening, and with her elevated white blood cell count, likely she had acute cholecystitis.  This has improved and currently she is basically symptom-free.  However due to the inflammation that was noted on the CT scan, I think it would be most prudent to wait a few more weeks to allow for the inflammatory response to subside.  Discussed with her that we will proceed for surgery instead in about a month.  Discussed with her the role for robotic cholecystectomy and discussed with her the risks of bleeding, infection, injury to surrounding structures.  She is willing to proceed.  She understands that we will also use ICG during the surgery to better evaluate the biliary anatomy.  She understands that this is not outpatient surgery and also reviewed with her the postoperative restrictions, and recovery time. - We will schedule patient for surgery on 01/09/2021.  Face-to-face time spent with the patient and care providers was 80 minutes, with more than 50% of the time spent counseling, educating, and coordinating care of the patient.     Melvyn Neth, [facility]

## 2020-12-14 ENCOUNTER — Telehealth: Payer: Self-pay | Admitting: Surgery

## 2020-12-14 LAB — CERVICOVAGINAL ANCILLARY ONLY
Chlamydia: NEGATIVE
Comment: NEGATIVE
Comment: NORMAL
Neisseria Gonorrhea: NEGATIVE

## 2020-12-14 NOTE — Telephone Encounter (Signed)
Patient has been advised of Pre-Admission date/time, COVID Testing date and Surgery date.  Surgery Date: 01/09/21 Preadmission Testing Date: 01/01/21 (phone 8a-1p) Covid Testing Date: Not needed.     Patient has been made aware to call [phone], between 1-3:00pm the day before surgery, to find out what time to arrive for surgery.

## 2020-12-29 NOTE — Patient Instructions (Addendum)
Your procedure is scheduled on: 01/09/21 Report to [place]. To find out your arrival time please call [phone] between 1PM - 3PM on 01/08/21.  Remember: Instructions that are not followed completely may result in serious medical risk, up to and including death, or upon the discretion of your surgeon and anesthesiologist your surgery may need to be rescheduled.     _X__ 1. Do not eat food after midnight the night before your procedure.                 No gum chewing or hard candies. You may drink clear liquids up to 2 hours                 before you are scheduled to arrive for your surgery- DO not drink clear                 liquids within 2 hours of the start of your surgery.                 Clear Liquids include:  water, apple juice without pulp, clear carbohydrate                 drink such as Clearfast or Gatorade, Black Coffee or Tea (Do not add                 anything to coffee or tea). Diabetics water only  __X__2.  On the morning of surgery brush your teeth with toothpaste and water, you                 may rinse your mouth with mouthwash if you wish.  Do not swallow any              toothpaste of mouthwash.     _X__ 3.  No Alcohol for 24 hours before or after surgery.   _X__ 4.  Do Not Smoke or use e-cigarettes For 24 Hours Prior to Your Surgery.                 Do not use any chewable tobacco products for at least 6 hours prior to                 surgery.  ____  5.  Bring all medications with you on the day of surgery if instructed.   __X__  6.  Notify your doctor if there is any change in your medical condition      (cold, fever, infections).     Do not wear jewelry, make-up, hairpins, clips or nail polish. Do not wear lotions, powders, or perfumes. Shower the night before and the morning of with your regular soap. Do not shave 48 hours prior to surgery. Men may shave face and neck. Do not bring valuables to the  hospital.    [facility] is not responsible for any belongings or valuables.  Contacts, dentures/partials or body piercings may not be worn into surgery. Bring a case for your contacts, glasses or hearing aids, a denture cup will be supplied. Leave your suitcase in the car. After surgery it may be brought to your room. For patients admitted to the hospital, discharge time is determined by your treatment team.   Patients discharged the day of surgery will not be allowed to drive home.   Please read over the following fact sheets that you were given:   MRSA Information  __X__ Take these medicines  the morning of surgery with A SIP OF WATER:    1. none  2.   3.   4.  5.  6.  ____ Fleet Enema (as directed)   ____ Use CHG Soap/SAGE wipes as directed  ____ Use inhalers on the day of surgery  ____ Stop metformin/Janumet/Farxiga 2 days prior to surgery    ____ Take 1/2 of usual insulin dose the night before surgery. No insulin the morning          of surgery.   ____ Stop Blood Thinners Coumadin/Plavix/Xarelto/Pleta/Pradaxa/Eliquis/Effient/Aspirin  on   Or contact your Surgeon, Cardiologist or Medical Doctor regarding  ability to stop your blood thinners  __X__ Stop Anti-inflammatories 7 days before surgery such as Advil, Ibuprofen, Motrin,  BC or Goodies Powder, Naprosyn, Naproxen, Aleve, Aspirin    __X__ Stop all herbal supplements, fish oil or vitamin E until after surgery.    ____ Bring C-Pap to the hospital.

## 2021-01-01 ENCOUNTER — Encounter
Admission: RE | Admit: 2021-01-01 | Discharge: 2021-01-01 | Disposition: A | Discharge status: Home / Self Care | Payer: Medicaid Other | Source: Ambulatory Visit | Attending: Surgery | Admitting: Surgery

## 2021-01-01 ENCOUNTER — Other Ambulatory Visit: Payer: Self-pay

## 2021-01-03 ENCOUNTER — Telehealth (INDEPENDENT_AMBULATORY_CARE_PROVIDER_SITE_OTHER): Payer: Medicaid Other | Admitting: Family Medicine

## 2021-01-03 ENCOUNTER — Encounter: Payer: Self-pay | Admitting: Family Medicine

## 2021-01-03 ENCOUNTER — Ambulatory Visit: Payer: Self-pay

## 2021-01-03 VITALS — BP 99/63 | HR 108 | Temp 100.5°F | Ht 64.0 in | Wt 107.0 lb

## 2021-01-03 DIAGNOSIS — R509 Fever, unspecified: Secondary | ICD-10-CM

## 2021-01-03 NOTE — Progress Notes (Signed)
Name: Tiffany Guzman   MRN: [id number]    DOB: 10-Sep-1995   Date:01/03/2021       Progress Note  Subjective:    Chief Complaint  Chief Complaint  Patient presents with   Fever    Dizzy, fatigue, constipated, sore throat onset yesterday highest fever 102.      I connected with  Airianna Lon Sieben  on 01/03/21 at  3:40 PM EDT by a video enabled telemedicine application and verified that I am speaking with the correct person using two identifiers.  I discussed the limitations of evaluation and management by telemedicine and the availability of in person appointments. The patient expressed understanding and agreed to proceed. Staff also discussed with the patient that there may be a patient responsible charge related to this service. Patient Location: home Provider Location: [facility] Additional Individuals present: none  HPI Pt presents with illness onset yesterday with sore throat, fever, fatigue, abd upset, cold chills, body aches, sweats Tmax 102.2 No cough, CP, SOB      Patient Active Problem List   Diagnosis Date Noted   Stomach ache    Status post cesarean delivery 11/14/2017   Dermoid cyst 10/03/2017   Dermoid cyst of left ovary 10/01/2017   Anxiety 06/24/2016    Social History   Tobacco Use   Smoking status: Never   Smokeless tobacco: Never  Substance Use Topics   Alcohol use: No    Alcohol/week: 0.0 standard drinks     Current Outpatient Medications:    Multiple Vitamins-Minerals (WOMENS MULTIVITAMIN) TABS, Take 1 tablet by mouth daily., Disp: , Rfl:   Allergies  Allergen Reactions   Buspar [Buspirone]     Dystonic reaction - arms, legs, jaw "locked up"    I personally reviewed active problem list, medication list, allergies, family history, social history, health maintenance, notes from last encounter, lab results, imaging with the patient/caregiver today.   Review of Systems  Constitutional: Negative.   HENT: Negative.    Eyes: Negative.    Respiratory: Negative.    Cardiovascular: Negative.   Gastrointestinal: Negative.   Endocrine: Negative.   Genitourinary: Negative.   Musculoskeletal: Negative.   Skin: Negative.   Allergic/Immunologic: Negative.   Neurological: Negative.   Hematological: Negative.   Psychiatric/Behavioral: Negative.    All other systems reviewed and are negative.    Objective:   Virtual encounter, vitals limited, only able to obtain the following Today's Vitals   01/03/21 1536  BP: 99/63  Pulse: (!) 108  Temp: (!) 100.5 F (38.1 C)  Weight: 107 lb (48.5 kg)  Height: 5\' 4"  (1.626 m)   Body mass index is 18.37 kg/m. Nursing Note and Vital Signs reviewed.  Physical Exam Vitals and nursing note reviewed.  Constitutional:      General: Tiffany Guzman is not in acute distress.    Appearance: Normal appearance. Tiffany Guzman is well-developed. Tiffany Guzman is not ill-appearing, toxic-appearing or diaphoretic.     Comments: Well appearing young female  HENT:     Head: Normocephalic and atraumatic.  Eyes:     General:        Right eye: No discharge.        Left eye: No discharge.     Conjunctiva/sclera: Conjunctivae normal.  Neck:     Trachea: Phonation normal. No tracheal deviation.  Cardiovascular:     Rate and Rhythm: Normal rate.  Pulmonary:     Effort: Pulmonary effort is normal. No respiratory distress.     Breath sounds: No stridor.  Skin:    Coloration: Skin is not jaundiced or pale.     Findings: No rash.  Neurological:     Mental Status: Tiffany Guzman is alert.  Psychiatric:        Mood and Affect: Mood normal.        Behavior: Behavior normal.    PE limited by virtual encounter  No results found for this or any previous visit (from the past 72 hour(s)).  Assessment and Plan:     ICD-10-CM   1. Febrile illness  R50.9 Novel Coronavirus, NAA ([facility])    Test for COVID, flu, strep (pending available tests in clinic) CMA asked to call pt back to let her know what tests we have and arrange testing  tomorrow in front of clinic. Pt did not need a school or work note Tiffany Guzman was encouraged to rest, push fluids, use OTC antipyretics and analgesics PRN and as directed   -Red flags and when to present for emergency care or RTC including fever >101.40F, chest pain, shortness of breath, new/worsening/un-resolving symptoms, reviewed with patient at time of visit. Follow up and care instructions discussed and provided in AVS. - I discussed the assessment and treatment plan with the patient. The patient was provided an opportunity to ask questions and all were answered. The patient agreed with the plan and demonstrated an understanding of the instructions.  I provided 16 minutes of non-face-to-face time during this encounter.  [name], PA-C 01/03/21 4:04 PM

## 2021-01-03 NOTE — Telephone Encounter (Signed)
Pt. Reports she started feeling bad yesterday. Fever,headache,body aches, nausea, chills. Has not done a COVID test. Concerned because she is scheduled for gallbladder surgery next week. Virtual visit today. Use pt.'s cell number.

## 2021-01-03 NOTE — Telephone Encounter (Signed)
Reason for Disposition  Fever present > 3 days (72 hours)  Answer Assessment - Initial Assessment Questions 1. TEMPERATURE: "What is the most recent temperature?"  "How was it measured?"      101 2. ONSET: "When did the fever start?"      Yesterday 3. CHILLS: "Do you have chills?" If yes: "How bad are they?"  (e.g., none, mild, moderate, severe)   - NONE: no chills   - MILD: feeling cold   - MODERATE: feeling very cold, some shivering (feels better under a thick blanket)   - SEVERE: feeling extremely cold with shaking chills (general body shaking, rigors; even under a thick blanket)      Mild 4. OTHER SYMPTOMS: "Do you have any other symptoms besides the fever?"  (e.g., abdomen pain, cough, diarrhea, earache, headache, sore throat, urination pain)     Headache,nausea,chills, body aches 5. CAUSE: If there are no symptoms, ask: "What do you think is causing the fever?"      Unsure 6. CONTACTS: "Does anyone else in the family have an infection?"     No 7. TREATMENT: "What have you done so far to treat this fever?" (e.g., medications)     Tylenol 8. IMMUNOCOMPROMISE: "Do you have of the following: diabetes, HIV positive, splenectomy, cancer chemotherapy, chronic steroid treatment, transplant patient, etc."     No 9. PREGNANCY: "Is there any chance you are pregnant?" "When was your last menstrual period?"     No 10. TRAVEL: "Have you traveled out of the country in the last month?" (e.g., travel history, exposures)       No  Protocols used: [facility]

## 2021-01-04 DIAGNOSIS — R509 Fever, unspecified: Secondary | ICD-10-CM | POA: Diagnosis not present

## 2021-01-04 NOTE — Addendum Note (Signed)
Addended by: Docia Furl on: 01/04/2021 11:38 AM   Modules accepted: Orders

## 2021-01-05 LAB — NOVEL CORONAVIRUS, NAA: SARS-CoV-2, NAA: DETECTED — AB

## 2021-01-05 LAB — SPECIMEN STATUS REPORT

## 2021-01-05 LAB — SARS-COV-2, NAA 2 DAY TAT

## 2021-01-06 LAB — CULTURE, GROUP A STREP
MICRO NUMBER:: 12016154
SPECIMEN QUALITY:: ADEQUATE

## 2021-01-08 ENCOUNTER — Telehealth: Payer: Self-pay | Admitting: Surgery

## 2021-01-08 NOTE — Telephone Encounter (Signed)
Updated information regarding rescheduled surgery.  Patient has been advises of Pre-Admission date/time, COVID Testing date and Surgery date.  Surgery Date: 01/16/21 Preadmission Testing Date: 01/01/21 (already done.   Covid Testing Date: Not needed.     Patient has been made aware to call [phone], between 1-3:00pm the day before surgery, to find out what time to arrive for surgery.

## 2021-01-16 ENCOUNTER — Ambulatory Visit: Payer: Medicaid Other | Admitting: Certified Registered Nurse Anesthetist

## 2021-01-16 ENCOUNTER — Encounter
Admission: RE | Disposition: A | Discharge status: Home / Self Care | Payer: Self-pay | Source: Home / Self Care | Attending: Surgery

## 2021-01-16 ENCOUNTER — Other Ambulatory Visit: Payer: Self-pay

## 2021-01-16 ENCOUNTER — Ambulatory Visit
Admission: RE | Admit: 2021-01-16 | Discharge: 2021-01-16 | Disposition: A | Discharge status: Home / Self Care | Payer: Medicaid Other | Attending: Surgery | Admitting: Surgery

## 2021-01-16 ENCOUNTER — Encounter: Payer: Self-pay | Admitting: Surgery

## 2021-01-16 DIAGNOSIS — Z8249 Family history of ischemic heart disease and other diseases of the circulatory system: Secondary | ICD-10-CM | POA: Insufficient documentation

## 2021-01-16 DIAGNOSIS — K811 Chronic cholecystitis: Secondary | ICD-10-CM | POA: Diagnosis not present

## 2021-01-16 DIAGNOSIS — Z8349 Family history of other endocrine, nutritional and metabolic diseases: Secondary | ICD-10-CM | POA: Insufficient documentation

## 2021-01-16 DIAGNOSIS — K819 Cholecystitis, unspecified: Secondary | ICD-10-CM | POA: Diagnosis not present

## 2021-01-16 DIAGNOSIS — Z833 Family history of diabetes mellitus: Secondary | ICD-10-CM | POA: Diagnosis not present

## 2021-01-16 DIAGNOSIS — K828 Other specified diseases of gallbladder: Secondary | ICD-10-CM | POA: Diagnosis not present

## 2021-01-16 DIAGNOSIS — Z79899 Other long term (current) drug therapy: Secondary | ICD-10-CM | POA: Insufficient documentation

## 2021-01-16 DIAGNOSIS — F419 Anxiety disorder, unspecified: Secondary | ICD-10-CM | POA: Diagnosis not present

## 2021-01-16 DIAGNOSIS — Z888 Allergy status to other drugs, medicaments and biological substances status: Secondary | ICD-10-CM | POA: Diagnosis not present

## 2021-01-16 DIAGNOSIS — K219 Gastro-esophageal reflux disease without esophagitis: Secondary | ICD-10-CM | POA: Diagnosis not present

## 2021-01-16 DIAGNOSIS — K810 Acute cholecystitis: Secondary | ICD-10-CM

## 2021-01-16 DIAGNOSIS — Z9049 Acquired absence of other specified parts of digestive tract: Secondary | ICD-10-CM

## 2021-01-16 LAB — URINE DRUG SCREEN, QUALITATIVE (ARMC ONLY)
Amphetamines, Ur Screen: NOT DETECTED
Barbiturates, Ur Screen: NOT DETECTED
Benzodiazepine, Ur Scrn: NOT DETECTED
Cannabinoid 50 Ng, Ur ~~LOC~~: POSITIVE — AB
Cocaine Metabolite,Ur ~~LOC~~: NOT DETECTED
MDMA (Ecstasy)Ur Screen: NOT DETECTED
Methadone Scn, Ur: NOT DETECTED
Opiate, Ur Screen: NOT DETECTED
Phencyclidine (PCP) Ur S: NOT DETECTED
Tricyclic, Ur Screen: NOT DETECTED

## 2021-01-16 LAB — POCT PREGNANCY, URINE: Preg Test, Ur: NEGATIVE

## 2021-01-16 SURGERY — CHOLECYSTECTOMY, ROBOT-ASSISTED, LAPAROSCOPIC
Anesthesia: General

## 2021-01-16 MED ORDER — OXYCODONE HCL 5 MG/5ML PO SOLN: 5.0000 mg | Freq: Once | ORAL | Status: AC | PRN

## 2021-01-16 MED ORDER — ORAL CARE MOUTH RINSE: 15.0000 mL | Freq: Once | OROMUCOSAL | Status: DC

## 2021-01-16 MED ORDER — FENTANYL CITRATE (PF) 100 MCG/2ML IJ SOLN
INTRAMUSCULAR | Status: AC
  Administered 2021-01-16: 25 ug via INTRAVENOUS
  Filled 2021-01-16: qty 2

## 2021-01-16 MED ORDER — ACETAMINOPHEN 500 MG PO TABS: 1000.0000 mg | ORAL_TABLET | Freq: Four times a day (QID) | ORAL | Status: DC | PRN

## 2021-01-16 MED ORDER — CHLORHEXIDINE GLUCONATE 0.12 % MT SOLN
OROMUCOSAL | Status: AC
  Filled 2021-01-16: qty 15

## 2021-01-16 MED ORDER — BUPIVACAINE-EPINEPHRINE (PF) 0.25% -1:200000 IJ SOLN
INTRAMUSCULAR | Status: AC
  Filled 2021-01-16: qty 30

## 2021-01-16 MED ORDER — FAMOTIDINE 20 MG PO TABS
ORAL_TABLET | ORAL | Status: AC
  Filled 2021-01-16: qty 1

## 2021-01-16 MED ORDER — ROCURONIUM BROMIDE 100 MG/10ML IV SOLN
INTRAVENOUS | Status: DC | PRN
  Administered 2021-01-16: 40 mg via INTRAVENOUS

## 2021-01-16 MED ORDER — OXYCODONE HCL 5 MG PO TABS: 5.0000 mg | ORAL_TABLET | ORAL | 0 refills | Status: DC | PRN

## 2021-01-16 MED ORDER — FAMOTIDINE 20 MG PO TABS
20.0000 mg | ORAL_TABLET | Freq: Once | ORAL | Status: AC
  Administered 2021-01-16: 20 mg via ORAL

## 2021-01-16 MED ORDER — FENTANYL CITRATE (PF) 100 MCG/2ML IJ SOLN
INTRAMUSCULAR | Status: AC
  Filled 2021-01-16: qty 2

## 2021-01-16 MED ORDER — BUPIVACAINE-EPINEPHRINE (PF) 0.25% -1:200000 IJ SOLN
INTRAMUSCULAR | Status: DC | PRN
  Administered 2021-01-16: 30 mL

## 2021-01-16 MED ORDER — SODIUM CHLORIDE FLUSH 0.9 % IV SOLN
INTRAVENOUS | Status: AC
  Filled 2021-01-16: qty 20

## 2021-01-16 MED ORDER — ONDANSETRON HCL 4 MG/2ML IJ SOLN
INTRAMUSCULAR | Status: DC | PRN
  Administered 2021-01-16: 4 mg via INTRAVENOUS

## 2021-01-16 MED ORDER — PENTAFLUOROPROP-TETRAFLUOROETH EX AERO
INHALATION_SPRAY | CUTANEOUS | Status: AC
  Filled 2021-01-16: qty 30

## 2021-01-16 MED ORDER — ONDANSETRON 4 MG PO TBDP: 4.0000 mg | ORAL_TABLET | Freq: Three times a day (TID) | ORAL | 0 refills | Status: DC | PRN

## 2021-01-16 MED ORDER — GABAPENTIN 300 MG PO CAPS
ORAL_CAPSULE | ORAL | Status: AC
  Filled 2021-01-16: qty 1

## 2021-01-16 MED ORDER — CEFAZOLIN SODIUM-DEXTROSE 2-4 GM/100ML-% IV SOLN
INTRAVENOUS | Status: AC
  Filled 2021-01-16: qty 100

## 2021-01-16 MED ORDER — KETOROLAC TROMETHAMINE 30 MG/ML IJ SOLN
INTRAMUSCULAR | Status: DC | PRN
  Administered 2021-01-16: 15 mg via INTRAVENOUS

## 2021-01-16 MED ORDER — MIDAZOLAM HCL 2 MG/2ML IJ SOLN
INTRAMUSCULAR | Status: DC | PRN
  Administered 2021-01-16: 2 mg via INTRAVENOUS

## 2021-01-16 MED ORDER — OXYCODONE HCL 5 MG PO TABS
ORAL_TABLET | ORAL | Status: AC
  Administered 2021-01-16: 5 mg via ORAL
  Filled 2021-01-16: qty 1

## 2021-01-16 MED ORDER — PROPOFOL 10 MG/ML IV BOLUS
INTRAVENOUS | Status: DC | PRN
  Administered 2021-01-16: 100 mg via INTRAVENOUS
  Administered 2021-01-16: 30 mg via INTRAVENOUS
  Administered 2021-01-16: 20 mg via INTRAVENOUS

## 2021-01-16 MED ORDER — DEXAMETHASONE SODIUM PHOSPHATE 10 MG/ML IJ SOLN
INTRAMUSCULAR | Status: DC | PRN
  Administered 2021-01-16: 5 mg via INTRAVENOUS

## 2021-01-16 MED ORDER — FENTANYL CITRATE (PF) 100 MCG/2ML IJ SOLN
INTRAMUSCULAR | Status: DC | PRN
  Administered 2021-01-16 (×2): 50 ug via INTRAVENOUS
  Administered 2021-01-16: 100 ug via INTRAVENOUS

## 2021-01-16 MED ORDER — LIDOCAINE HCL (CARDIAC) PF 100 MG/5ML IV SOSY
PREFILLED_SYRINGE | INTRAVENOUS | Status: DC | PRN
  Administered 2021-01-16: 60 mg via INTRAVENOUS

## 2021-01-16 MED ORDER — CHLORHEXIDINE GLUCONATE CLOTH 2 % EX PADS: 6.0000 | MEDICATED_PAD | Freq: Once | CUTANEOUS | Status: DC

## 2021-01-16 MED ORDER — DEXMEDETOMIDINE (PRECEDEX) IN NS 20 MCG/5ML (4 MCG/ML) IV SYRINGE
PREFILLED_SYRINGE | INTRAVENOUS | Status: DC | PRN
  Administered 2021-01-16: 8 ug via INTRAVENOUS

## 2021-01-16 MED ORDER — IBUPROFEN 600 MG PO TABS
600.0000 mg | ORAL_TABLET | Freq: Three times a day (TID) | ORAL | 1 refills | Status: DC | PRN
Start: 2021-01-16 — End: 2021-02-26

## 2021-01-16 MED ORDER — LACTATED RINGERS IV SOLN: INTRAVENOUS | Status: DC

## 2021-01-16 MED ORDER — OXYCODONE HCL 5 MG PO TABS: 5.0000 mg | ORAL_TABLET | Freq: Once | ORAL | Status: AC | PRN

## 2021-01-16 MED ORDER — CHLORHEXIDINE GLUCONATE 0.12 % MT SOLN: 15.0000 mL | Freq: Once | OROMUCOSAL | Status: DC

## 2021-01-16 MED ORDER — ACETAMINOPHEN 500 MG PO TABS
1000.0000 mg | ORAL_TABLET | ORAL | Status: AC
  Administered 2021-01-16: 1000 mg via ORAL

## 2021-01-16 MED ORDER — FENTANYL CITRATE (PF) 100 MCG/2ML IJ SOLN
25.0000 ug | INTRAMUSCULAR | Status: DC | PRN
  Administered 2021-01-16 (×2): 50 ug via INTRAVENOUS
  Administered 2021-01-16: 25 ug via INTRAVENOUS

## 2021-01-16 MED ORDER — 0.9 % SODIUM CHLORIDE (POUR BTL) OPTIME
TOPICAL | Status: DC | PRN
  Administered 2021-01-16: 60 mL

## 2021-01-16 MED ORDER — SUGAMMADEX SODIUM 200 MG/2ML IV SOLN
INTRAVENOUS | Status: DC | PRN
  Administered 2021-01-16: 120 mg via INTRAVENOUS

## 2021-01-16 MED ORDER — MIDAZOLAM HCL 2 MG/2ML IJ SOLN
INTRAMUSCULAR | Status: AC
  Filled 2021-01-16: qty 2

## 2021-01-16 MED ORDER — ACETAMINOPHEN 500 MG PO TABS
ORAL_TABLET | ORAL | Status: AC
  Filled 2021-01-16: qty 2

## 2021-01-16 MED ORDER — GABAPENTIN 300 MG PO CAPS
300.0000 mg | ORAL_CAPSULE | ORAL | Status: AC
  Administered 2021-01-16: 300 mg via ORAL

## 2021-01-16 MED ORDER — FENTANYL CITRATE (PF) 100 MCG/2ML IJ SOLN
INTRAMUSCULAR | Status: AC
  Administered 2021-01-16: 50 ug via INTRAVENOUS
  Filled 2021-01-16: qty 2

## 2021-01-16 MED ORDER — CEFAZOLIN SODIUM-DEXTROSE 2-4 GM/100ML-% IV SOLN
2.0000 g | INTRAVENOUS | Status: AC
  Administered 2021-01-16: 2 g via INTRAVENOUS

## 2021-01-16 MED ORDER — INDOCYANINE GREEN 25 MG IV SOLR
2.5000 mg | INTRAVENOUS | Status: AC
  Administered 2021-01-16: 2.5 mg via INTRAVENOUS
  Filled 2021-01-16: qty 1

## 2021-01-16 SURGICAL SUPPLY — 56 items
BAG INFUSER PRESSURE 100CC (MISCELLANEOUS) IMPLANT
CANNULA REDUC XI 12-8 STAPL (CANNULA) ×1
CANNULA REDUC XI 12-8MM STAPL (CANNULA) ×1
CANNULA REDUCER 12-8 DVNC XI (CANNULA) ×1 IMPLANT
CHLORAPREP W/TINT 26 (MISCELLANEOUS) ×3 IMPLANT
CLIP VESOLOCK MED LG 6/CT (CLIP) ×3 IMPLANT
COVER WAND RF STERILE (DRAPES) ×3 IMPLANT
CUP MEDICINE 2OZ PLAST GRAD ST (MISCELLANEOUS) ×3 IMPLANT
DECANTER SPIKE VIAL GLASS SM (MISCELLANEOUS) ×3 IMPLANT
DEFOGGER SCOPE WARMER CLEARIFY (MISCELLANEOUS) ×3 IMPLANT
DERMABOND ADVANCED (GAUZE/BANDAGES/DRESSINGS) ×2
DERMABOND ADVANCED .7 DNX12 (GAUZE/BANDAGES/DRESSINGS) ×1 IMPLANT
DRAPE ARM DVNC X/XI (DISPOSABLE) ×4 IMPLANT
DRAPE COLUMN DVNC XI (DISPOSABLE) ×1 IMPLANT
DRAPE DA VINCI XI ARM (DISPOSABLE) ×8
DRAPE DA VINCI XI COLUMN (DISPOSABLE) ×2
ELECT CAUTERY BLADE TIP 2.5 (TIP) ×3
ELECT REM PT RETURN 9FT ADLT (ELECTROSURGICAL) ×3
ELECTRODE CAUTERY BLDE TIP 2.5 (TIP) ×1 IMPLANT
ELECTRODE REM PT RTRN 9FT ADLT (ELECTROSURGICAL) ×1 IMPLANT
GAUZE 4X4 16PLY ~~LOC~~+RFID DBL (SPONGE) ×3 IMPLANT
GLOVE SURG SYN 7.0 (GLOVE) ×6 IMPLANT
GLOVE SURG SYN 7.5  E (GLOVE) ×4
GLOVE SURG SYN 7.5 E (GLOVE) ×2 IMPLANT
GOWN STRL REUS W/ TWL LRG LVL3 (GOWN DISPOSABLE) ×4 IMPLANT
GOWN STRL REUS W/TWL LRG LVL3 (GOWN DISPOSABLE) ×8
IRRIGATOR SUCT 8 DISP DVNC XI (IRRIGATION / IRRIGATOR) IMPLANT
IRRIGATOR SUCTION 8MM XI DISP (IRRIGATION / IRRIGATOR)
IV NS 1000ML (IV SOLUTION)
IV NS 1000ML BAXH (IV SOLUTION) IMPLANT
KIT PINK PAD W/HEAD ARE REST (MISCELLANEOUS) ×3
KIT PINK PAD W/HEAD ARM REST (MISCELLANEOUS) ×1 IMPLANT
LABEL OR SOLS (LABEL) ×3 IMPLANT
MANIFOLD NEPTUNE II (INSTRUMENTS) ×3 IMPLANT
NEEDLE HYPO 22GX1.5 SAFETY (NEEDLE) ×3 IMPLANT
NS IRRIG 500ML POUR BTL (IV SOLUTION) ×3 IMPLANT
OBTURATOR OPTICAL STANDARD 8MM (TROCAR) ×2
OBTURATOR OPTICAL STND 8 DVNC (TROCAR) ×1
OBTURATOR OPTICALSTD 8 DVNC (TROCAR) ×1 IMPLANT
PACK LAP CHOLECYSTECTOMY (MISCELLANEOUS) ×3 IMPLANT
PENCIL ELECTRO HAND CTR (MISCELLANEOUS) ×3 IMPLANT
POUCH SPECIMEN RETRIEVAL 10MM (ENDOMECHANICALS) ×3 IMPLANT
SEAL CANN UNIV 5-8 DVNC XI (MISCELLANEOUS) ×3 IMPLANT
SEAL XI 5MM-8MM UNIVERSAL (MISCELLANEOUS) ×6
SET TUBE SMOKE EVAC HIGH FLOW (TUBING) ×3 IMPLANT
SOLUTION ELECTROLUBE (MISCELLANEOUS) ×3 IMPLANT
SPONGE T-LAP 18X18 ~~LOC~~+RFID (SPONGE) IMPLANT
SPONGE T-LAP 4X18 ~~LOC~~+RFID (SPONGE) ×3 IMPLANT
STAPLER CANNULA SEAL DVNC XI (STAPLE) ×1 IMPLANT
STAPLER CANNULA SEAL XI (STAPLE) ×2
SUT MNCRL AB 4-0 PS2 18 (SUTURE) ×3 IMPLANT
SUT VIC AB 3-0 SH 27 (SUTURE)
SUT VIC AB 3-0 SH 27X BRD (SUTURE) IMPLANT
SUT VICRYL 0 AB UR-6 (SUTURE) ×6 IMPLANT
TAPE TRANSPORE STRL 2 31045 (GAUZE/BANDAGES/DRESSINGS) ×3 IMPLANT
TROCAR BALLN GELPORT 12X130M (ENDOMECHANICALS) ×3 IMPLANT

## 2021-01-16 NOTE — Transfer of Care (Signed)
Immediate Anesthesia Transfer of Care Note  Patient: Tiffany Guzman  Procedure(s) Performed: XI ROBOTIC ASSISTED LAPAROSCOPIC CHOLECYSTECTOMY INDOCYANINE GREEN FLUORESCENCE IMAGING (ICG)  Patient Location: PACU  Anesthesia Type:General  Level of Consciousness: awake, drowsy and patient cooperative  Airway & Oxygen Therapy: Patient Spontanous Breathing and Patient connected to face mask oxygen  Post-op Assessment: Report given to RN and Post -op Vital signs reviewed and stable  Post vital signs: Reviewed and stable  Last Vitals:  Vitals Value Taken Time  BP 108/63 01/16/21 1300  Temp    Pulse 72 01/16/21 1302  Resp 16 01/16/21 1302  SpO2 100 % 01/16/21 1302  Vitals shown include unvalidated device data.  Last Pain:  Vitals:   01/16/21 0833  TempSrc: Oral     Temp 91.4    Complications: No notable events documented.

## 2021-01-16 NOTE — Discharge Instructions (Signed)
AMBULATORY SURGERY  ?DISCHARGE INSTRUCTIONS ? ? ?The drugs that you were given will stay in your system until tomorrow so for the next 24 hours you should not: ? ?Drive an automobile ?Make any legal decisions ?Drink any alcoholic beverage ? ? ?You may resume regular meals tomorrow.  Today it is better to start with liquids and gradually work up to solid foods. ? ?You may eat anything you prefer, but it is better to start with liquids, then soup and crackers, and gradually work up to solid foods. ? ? ?Please notify your doctor immediately if you have any unusual bleeding, trouble breathing, redness and pain at the surgery site, drainage, fever, or pain not relieved by medication. ? ? ? ?Additional Instructions: ? ? ? ?Please contact your physician with any problems or Same Day Surgery at [phone], Monday through Friday 6 am to 4 pm, or [place] [facility] Main number at [phone].  ?

## 2021-01-16 NOTE — H&P (Signed)
01/16/21  History of Present Illness: Tiffany Guzman is a 25 y.o. female presenting for evaluation of cholecystitis.  The patient presented to an outside hospital in [place] on 12/04/2020 with upper abdominal pain, nausea, and vomiting.  She had a CT scan of the abdomen pelvis which showed periportal edema with diffuse nonspecific gallbladder wall thickening or edema.  She had a white blood cell count of 15.4, with normal LFTs.  She was given a dose of IV Cipro and Flagyl and discharged home.  She was not able to fill the prescriptions for oral antibiotics and she called her primary care provider to ask for a new prescription for antibiotics but by then she was feeling much better and no antibiotics were needed.  She also had a repeat lab work which showed a normalizing white blood cell count.  She reports that since that visit to the emergency room, she has been doing well with no further nausea or vomiting and has been able to tolerate diet.  Thinking back, she feels that she has had multiple episodes like this although with less severity in the past over the last few years.  She initially thought that this was related to marijuana use and quit smoking for almost a year without any change in her symptoms.  Now she is realizing that this is more related to her gallbladder.  She is interested in surgery.  Currently denies any fevers, chills, chest pain, shortness of breath, nausea, vomiting.   Past Medical History:     Past Medical History:  Diagnosis Date   Acne     Anxiety     Attention deficit hyperactivity disorder (ADHD)     Chronic upper back pain      lower and upper back pain   Depression     Dyspnea     GERD (gastroesophageal reflux disease)     Gestational hypertension 11/14/2017   Migraine with aura      since elementary school      Past Surgical History:      Past Surgical History:  Procedure Laterality Date   CESAREAN SECTION N/A 11/14/2017    Procedure: CESAREAN SECTION;   Surgeon: Will Bonnet, MD;  Location: [facility] ORS;  Service: Obstetrics;  Laterality: N/A;   ESOPHAGOGASTRODUODENOSCOPY Left 02/17/2019    Procedure: ESOPHAGOGASTRODUODENOSCOPY (EGD);  Surgeon: Virgel Manifold, MD;  Location: [facility] ENDOSCOPY;  Service: Endoscopy;  Laterality: Left;   INSERTION OF NON VAGINAL CONTRACEPTIVE DEVICE       LAPAROSCOPIC OVARIAN CYSTECTOMY Right 02/23/2019    Procedure: LAPAROSCOPIC OVARIAN CYSTECTOMY;  Surgeon: Homero Fellers, MD;  Location: [facility] ORS;  Service: Gynecology;  Laterality: Right;   OOPHORECTOMY Left 10/03/2017    Procedure: OOPHORECTOMY;  Surgeon: Homero Fellers, MD;  Location: [facility] ORS;  Service: Gynecology;  Laterality: Left;   OVARIAN CYST REMOVAL Left 10/03/2017    Procedure: OVARIAN CYSTECTOMY;  Surgeon: Homero Fellers, MD;  Location: [facility] ORS;  Service: Gynecology;  Laterality: Left;      Home Medications:        Prior to Admission medications  Medication Sig Start Date End Date Taking? Authorizing Provider  misoprostol (CYTOTEC) 100 MCG tablet Take 1 tablet (100 mcg total) by mouth once for 1 dose. 1 hour before appt 12/12/20 [birth year]   Copland, Deirdre Evener, PA-C      Allergies:      Allergies  Allergen Reactions   Buspar [Buspirone]        Dystonic reaction - arms,  legs, jaw "locked up"      Social History:  reports that she has never smoked. She has never used smokeless tobacco. She reports current drug use. Frequency: 7.00 times per week. Drug: Marijuana. She reports that she does not drink alcohol.    Family History:      Family History  Problem Relation Age of Onset   Hypertension Mother     Hypothyroidism Mother     Depression Mother     Diabetes Maternal Grandmother        Review of Systems: Review of Systems Constitutional: Negative for chills and fever. HENT: Negative for hearing loss.   Respiratory: Negative for shortness of breath.   Cardiovascular: Negative for chest pain. Gastrointestinal:  Negative for abdominal pain, nausea and vomiting. Genitourinary: Negative for dysuria. Musculoskeletal: Negative for myalgias. Skin: Negative for rash. Neurological: Negative for dizziness. Psychiatric/Behavioral: Negative for depression.      Physical Exam BP 106/71   Pulse 63   Temp 98.6 F (37 C)   Ht 5\' 4"  (1.626 m)   Wt 112 lb (50.8 kg)   LMP 11/01/2020   SpO2 98%   BMI 19.22 kg/m  CONSTITUTIONAL: No acute distress, well-nourished HEENT:  Normocephalic, atraumatic, extraocular motion intact. NECK: Trachea is midline, and there is no jugular venous distension. RESPIRATORY:  Lungs are clear, and breath sounds are equal bilaterally. Normal respiratory effort without pathologic use of accessory muscles. CARDIOVASCULAR: Heart is regular without murmurs, gallops, or rubs. GI: The abdomen is soft, nondistended, with minimal discomfort in the epigastric area.  Negative Murphy's sign. MUSCULOSKELETAL:  Normal muscle strength and tone in all four extremities.  No peripheral edema or cyanosis. SKIN: Skin turgor is normal. There are no pathologic skin lesions. NEUROLOGIC:  Motor and sensation is grossly normal.  Cranial nerves are grossly intact. PSYCH:  Alert and oriented to person, place and time. Affect is normal.   Laboratory Analysis: Labs from 11/30/2020: Sodium 140, potassium 3.5, chloride 104, CO2 25, BUN 10, creatinine 0.74.  Total bilirubin 0.7, AST 16, ALT 15, alkaline phosphatase 54.  WBC 10.6, hemoglobin 13, hematocrit 38.9, platelets 253.   Imaging: CT scan of abdomen and pelvis on 11/24/2020: Impression: Periportal edema with diffuse nonspecific gallbladder wall thickening or edema.     Assessment and Plan: This is a 25 y.o. female with likely history of acute cholecystitis earlier this month, now resolved.   - Discussed with the patient that likely her symptoms in the past were related to her gallbladder particularly with no improvement after stopping marijuana use.   Her CT scan from outside hospital did show periportal edema with gallbladder wall thickening, and with her elevated white blood cell count, likely she had acute cholecystitis.  This has improved and currently she is basically symptom-free.  However due to the inflammation that was noted on the CT scan, I think it would be most prudent to wait a few more weeks to allow for the inflammatory response to subside.  Discussed with her that we will proceed for surgery instead in about a month.  Discussed with her the role for robotic cholecystectomy and discussed with her the risks of bleeding, infection, injury to surrounding structures.  She is willing to proceed.  She understands that we will also use ICG during the surgery to better evaluate the biliary anatomy.  She understands that this is not outpatient surgery and also reviewed with her the postoperative restrictions, and recovery time. - We will schedule patient for surgery on 01/16/2021.  Melvyn Neth, [facility]

## 2021-01-16 NOTE — Op Note (Signed)
  Procedure Date:  01/16/2021  Pre-operative Diagnosis:  History of cholecystitis  Post-operative Diagnosis:  History of cholecystitis  Procedure:  Robotic assisted cholecystectomy with ICG FireFly cholangiogram  Surgeon:  Melvyn Neth, MD  Anesthesia:  General endotracheal  Estimated Blood Loss:  5 ml  Specimens:  gallbladder  Complications:  None  Indications for Procedure:  This is a 25 y.o. female who presented to outside hospital in [place] with RUQ abdominal pain and was diagnosed with cholecystitis.  This was treated conservatively, and now she presents for interval cholecystectomy.  The benefits, complications, treatment options, and expected outcomes were discussed with the patient. The risks of bleeding, infection, recurrence of symptoms, failure to resolve symptoms, bile duct damage, bile duct leak, retained common bile duct stone, bowel injury, and need for further procedures were all discussed with the patient and she was willing to proceed.  Description of Procedure: The patient was correctly identified in the preoperative area and brought into the operating room.  The patient was placed supine with VTE prophylaxis in place.  Appropriate time-outs were performed.  Anesthesia was induced and the patient was intubated.  Appropriate antibiotics were infused.  The abdomen was prepped and draped in a sterile fashion. An infraumbilical incision was made. A cutdown technique was used to enter the abdominal cavity without injury, and a 12 mm robotic port was inserted.  Pneumoperitoneum was obtained with appropriate opening pressures.  Three 8-mm ports were placed in the mid abdomen at the level of the umbilicus under direct visualization.  The DaVinci platform was docked, camera targeted, and instruments were placed under direct visualization.  The gallbladder was identified.  The fundus was grasped and retracted cephalad.  Adhesions were lysed bluntly and with electrocautery.  The infundibulum was grasped and retracted laterally, exposing the peritoneum overlying the gallbladder.  This was incised with electrocautery and extended on either side of the gallbladder.  FireFly cholangiogram was then obtained, and we were able to clearly identify the cystic duct and common bile duct.  The cystic duct and cystic artery were carefully dissected with combination of cautery and blunt dissection.  Both were clipped twice proximally and once distally, cutting in between.  The gallbladder was taken from the gallbladder fossa in a retrograde fashion with electrocautery. The gallbladder was placed in an Endocatch bag. The liver bed was inspected and any bleeding was controlled with electrocautery. The right upper quadrant was then inspected again revealing intact clips, no bleeding, and no ductal injury.  The area was thoroughly irrigated.  The 8 mm ports were removed under direct visualization and the 12 mm port was removed.  The Endocatch bag was brought out via the umbilical incision. The fascial opening was closed using 0 vicryl sutures.  Local anesthetic was infused in all incisions.  The infraumbilical incision was closed in two layers using 3-0 vicryl and 4-0 Monocryl, and the remaining port incisions were closed with 4-0 Monocryl.  The wounds were cleaned and sealed with DermaBond.  The patient was emerged from anesthesia and extubated and brought to the recovery room for further management.  The patient tolerated the procedure well and all counts were correct at the end of the case.   Melvyn Neth, MD

## 2021-01-16 NOTE — Anesthesia Postprocedure Evaluation (Signed)
Anesthesia Post Note  Patient: Tiffany Guzman  Procedure(s) Performed: XI ROBOTIC ASSISTED LAPAROSCOPIC CHOLECYSTECTOMY INDOCYANINE GREEN FLUORESCENCE IMAGING (ICG)  Patient location during evaluation: PACU Anesthesia Type: General Level of consciousness: awake and alert and oriented Pain management: pain level controlled Vital Signs Assessment: post-procedure vital signs reviewed and stable Respiratory status: spontaneous breathing, nonlabored ventilation and respiratory function stable Cardiovascular status: blood pressure returned to baseline and stable Postop Assessment: no signs of nausea or vomiting Anesthetic complications: no   No notable events documented.   Last Vitals:  Vitals:   01/16/21 1400 01/16/21 1416  BP: 111/75 109/73  Pulse: 66 68  Resp: 13   Temp:  (!) 36.3 C  SpO2: 100% 100%    Last Pain:  Vitals:   01/16/21 1416  TempSrc: Temporal  PainSc: 7                  Ibtisam Benge

## 2021-01-16 NOTE — Anesthesia Procedure Notes (Signed)
Procedure Name: Intubation Date/Time: 01/16/2021 11:08 AM Performed by: Lowry Bowl, CRNA Pre-anesthesia Checklist: Patient identified, Emergency Drugs available, Suction available and Patient being monitored Patient Re-evaluated:Patient Re-evaluated prior to induction Oxygen Delivery Method: Circle system utilized Preoxygenation: Pre-oxygenation with 100% oxygen Induction Type: IV induction Ventilation: Mask ventilation without difficulty Grade View: Grade I Tube type: Oral Tube size: 7.0 mm Number of attempts: 1 Airway Equipment and Method: Stylet and Video-laryngoscopy Placement Confirmation: ETT inserted through vocal cords under direct vision, positive ETCO2 and breath sounds checked- equal and bilateral Secured at: 21 cm Tube secured with: Tape Dental Injury: Teeth and Oropharynx as per pre-operative assessment

## 2021-01-16 NOTE — Anesthesia Preprocedure Evaluation (Signed)
Anesthesia Evaluation  Patient identified by MRN, date of birth, ID band Patient awake    Reviewed: Allergy & Precautions, NPO status , Patient's Chart, lab work & pertinent test results  History of Anesthesia Complications Negative for: history of anesthetic complications  Airway Mallampati: I  TM Distance: >3 FB Neck ROM: full    Dental  (+) Chipped   Pulmonary neg shortness of breath, asthma ,    Pulmonary exam normal        Cardiovascular Exercise Tolerance: Good hypertension, (-) angina(-) Past MI and (-) DOE Normal cardiovascular exam     Neuro/Psych  Headaches, PSYCHIATRIC DISORDERS negative psych ROS   GI/Hepatic Neg liver ROS, GERD  Medicated and Controlled,  Endo/Other  negative endocrine ROS  Renal/GU      Musculoskeletal   Abdominal   Peds  Hematology negative hematology ROS (+)   Anesthesia Other Findings Past Medical History: No date: Acne No date: Anxiety No date: Attention deficit hyperactivity disorder (ADHD) No date: Chronic upper back pain     Comment:  lower and upper back pain No date: Depression No date: Dyspnea No date: GERD (gastroesophageal reflux disease) 11/14/2017: Gestational hypertension No date: Migraine with aura     Comment:  since elementary school  Past Surgical History: 11/14/2017: CESAREAN SECTION; N/A     Comment:  Procedure: CESAREAN SECTION;  Surgeon: Will Bonnet, MD;  Location: [facility] ORS;  Service: Obstetrics;                Laterality: N/A; 02/17/2019: ESOPHAGOGASTRODUODENOSCOPY; Left     Comment:  Procedure: ESOPHAGOGASTRODUODENOSCOPY (EGD);  Surgeon:               Virgel Manifold, MD;  Location: [facility]OSCOPY;                Service: Endoscopy;  Laterality: Left; No date: INSERTION OF NON VAGINAL CONTRACEPTIVE DEVICE 02/23/2019: LAPAROSCOPIC OVARIAN CYSTECTOMY; Right     Comment:  Procedure: LAPAROSCOPIC OVARIAN CYSTECTOMY;  Surgeon:                Homero Fellers, MD;  Location: [facility] ORS;  Service:              Gynecology;  Laterality: Right; 10/03/2017: OOPHORECTOMY; Left     Comment:  Procedure: OOPHORECTOMY;  Surgeon: Homero Fellers, MD;  Location: [facility] ORS;  Service: Gynecology;                Laterality: Left; 10/03/2017: OVARIAN CYST REMOVAL; Left     Comment:  Procedure: OVARIAN CYSTECTOMY;  Surgeon: Homero Fellers, MD;  Location: [facility] ORS;  Service:               Gynecology;  Laterality: Left;  BMI    Body Mass Index: 18.02 kg/m      Reproductive/Obstetrics negative OB ROS                             Anesthesia Physical Anesthesia Plan  ASA: 3  Anesthesia Plan: General ETT   Post-op Pain Management:    Induction: Intravenous  PONV Risk Score and Plan: Ondansetron, Dexamethasone, Midazolam and Treatment may vary due to age or medical condition  Airway Management Planned: Oral ETT  Additional Equipment:   Intra-op Plan:   Post-operative Plan: Extubation in OR  Informed Consent: I have reviewed the patients History and Physical, chart, labs and discussed the procedure including the risks, benefits and alternatives for the proposed anesthesia with the patient or authorized representative who has indicated his/her understanding and acceptance.     Dental Advisory Given  Plan Discussed with: Anesthesiologist, CRNA and Surgeon  Anesthesia Plan Comments: (Patient consented for risks of anesthesia including but not limited to:  - adverse reactions to medications - damage to eyes, teeth, lips or other oral mucosa - nerve damage due to positioning  - sore throat or hoarseness - Damage to heart, brain, nerves, lungs, other parts of body or loss of life  Patient voiced understanding.)        Anesthesia Quick Evaluation

## 2021-01-17 LAB — SURGICAL PATHOLOGY

## 2021-01-30 ENCOUNTER — Other Ambulatory Visit: Payer: Self-pay

## 2021-01-30 ENCOUNTER — Ambulatory Visit (INDEPENDENT_AMBULATORY_CARE_PROVIDER_SITE_OTHER): Payer: Medicaid Other | Admitting: Physician Assistant

## 2021-01-30 ENCOUNTER — Encounter: Payer: Self-pay | Admitting: Physician Assistant

## 2021-01-30 VITALS — BP 104/67 | HR 55 | Temp 98.0°F | Ht 64.0 in | Wt 108.0 lb

## 2021-01-30 DIAGNOSIS — K810 Acute cholecystitis: Secondary | ICD-10-CM

## 2021-01-30 DIAGNOSIS — Z09 Encounter for follow-up examination after completed treatment for conditions other than malignant neoplasm: Secondary | ICD-10-CM

## 2021-01-30 NOTE — Patient Instructions (Signed)

## 2021-01-30 NOTE — Progress Notes (Signed)
[facility] POST-OP OFFICE VISIT  01/30/2021  HPI: Tiffany Guzman is a 25 y.o. female 14 days s/p robotic assisted laparoscopic cholecystectomy for cholecystitis with Dr Hampton Abbot   She reported that the first week after surgery was very rough secondary to abdominal and shoulder pain, but this significantly improved. She now has minimal soreness and intermittent RUQ "twinges" of pain. Only taking ibuprofen at this point. No fever, chills, nausea, emesis, or diarrhea. She is tolerating PO. No issues with incisions. No other complaints.   Vital signs: BP 104/67   Pulse (!) 55   Temp 98 F (36.7 C) (Oral)   Ht 5\' 4"  (1.626 m)   Wt 108 lb (49 kg)   SpO2 98%   BMI 18.54 kg/m    Physical Exam: Constitutional: Well appearing female, NAD Abdomen: Soft, non-tender, non-distended, no rebound/guarding Skin: Laparoscopic incisions are CDI with dermabond, some ecchymosis, no erythema or drainage   Assessment/Plan: This is a 25 y.o. female 14 days s/p robotic assisted laparoscopic cholecystectomy for cholecystitis    - Pain control prn as she is doing  - Reviewed lifting restrictions; 4 weeks total   - Reviewed local wound care  - Reviewed surgical pathology; chronic cholecystitis, negative for malignancy   - She can rtc on as needed basis   -- Edison Simon, PA-C [facility] 01/30/2021, 10:07 AM [phone] M-F: 7am - 4pm

## 2021-02-14 ENCOUNTER — Other Ambulatory Visit: Payer: Self-pay

## 2021-02-14 ENCOUNTER — Emergency Department
Admission: EM | Admit: 2021-02-14 | Discharge: 2021-02-14 | Disposition: A | Discharge status: Home / Self Care | Payer: Medicaid Other | Attending: Emergency Medicine | Admitting: Emergency Medicine

## 2021-02-14 DIAGNOSIS — R1084 Generalized abdominal pain: Secondary | ICD-10-CM | POA: Insufficient documentation

## 2021-02-14 DIAGNOSIS — R1111 Vomiting without nausea: Secondary | ICD-10-CM | POA: Diagnosis not present

## 2021-02-14 DIAGNOSIS — R000 Tachycardia, unspecified: Secondary | ICD-10-CM | POA: Diagnosis not present

## 2021-02-14 DIAGNOSIS — R52 Pain, unspecified: Secondary | ICD-10-CM | POA: Diagnosis not present

## 2021-02-14 DIAGNOSIS — R1115 Cyclical vomiting syndrome unrelated to migraine: Secondary | ICD-10-CM | POA: Diagnosis not present

## 2021-02-14 DIAGNOSIS — D72829 Elevated white blood cell count, unspecified: Secondary | ICD-10-CM | POA: Insufficient documentation

## 2021-02-14 LAB — COMPREHENSIVE METABOLIC PANEL
ALT: 14 U/L (ref 0–44)
AST: 21 U/L (ref 15–41)
Albumin: 4.7 g/dL (ref 3.5–5.0)
Alkaline Phosphatase: 69 U/L (ref 38–126)
Anion gap: 13 (ref 5–15)
BUN: 12 mg/dL (ref 6–20)
CO2: 21 mmol/L — ABNORMAL LOW (ref 22–32)
Calcium: 9.3 mg/dL (ref 8.9–10.3)
Chloride: 105 mmol/L (ref 98–111)
Creatinine, Ser: 0.77 mg/dL (ref 0.44–1.00)
GFR, Estimated: >60 mL/min (ref >60)
Glucose, Bld: 114 mg/dL — ABNORMAL HIGH (ref 70–99)
Potassium: 3.4 mmol/L — ABNORMAL LOW (ref 3.5–5.1)
Sodium: 139 mmol/L (ref 135–145)
Total Bilirubin: 0.8 mg/dL (ref 0.3–1.2)
Total Protein: 7.7 g/dL (ref 6.5–8.1)

## 2021-02-14 LAB — CBC
HCT: 38.7 % (ref 36.0–46.0)
Hemoglobin: 13.1 g/dL (ref 12.0–15.0)
MCH: 29 pg (ref 26.0–34.0)
MCHC: 33.9 g/dL (ref 30.0–36.0)
MCV: 85.6 fL (ref 80.0–100.0)
Platelets: 312 10*3/uL (ref 150–400)
RBC: 4.52 MIL/uL (ref 3.87–5.11)
RDW: 13.3 % (ref 11.5–15.5)
WBC: 15.2 10*3/uL — ABNORMAL HIGH (ref 4.0–10.5)
nRBC: 0 % (ref 0.0–0.2)

## 2021-02-14 LAB — URINALYSIS, COMPLETE (UACMP) WITH MICROSCOPIC
Bacteria, UA: NONE SEEN
Bilirubin Urine: NEGATIVE
Glucose, UA: NEGATIVE mg/dL
Hgb urine dipstick: NEGATIVE
Ketones, ur: 80 mg/dL — AB
Nitrite: NEGATIVE
Protein, ur: 30 mg/dL — AB
Specific Gravity, Urine: 1.028 (ref 1.005–1.030)
pH: 5 (ref 5.0–8.0)

## 2021-02-14 LAB — LIPASE, BLOOD: Lipase: 33 U/L (ref 11–51)

## 2021-02-14 LAB — POC URINE PREG, ED: Preg Test, Ur: NEGATIVE

## 2021-02-14 MED ORDER — SODIUM CHLORIDE 0.9 % IV BOLUS
1000.0000 mL | Freq: Once | INTRAVENOUS | Status: AC
  Administered 2021-02-14: 1000 mL via INTRAVENOUS

## 2021-02-14 MED ORDER — HALOPERIDOL LACTATE 5 MG/ML IJ SOLN
2.5000 mg | Freq: Once | INTRAMUSCULAR | Status: AC
  Administered 2021-02-14: 2.5 mg via INTRAVENOUS
  Filled 2021-02-14: qty 1

## 2021-02-14 MED ORDER — LORAZEPAM 2 MG/ML IJ SOLN
INTRAMUSCULAR | Status: AC
  Administered 2021-02-14: 0.5 mg via INTRAVENOUS
  Filled 2021-02-14: qty 1

## 2021-02-14 MED ORDER — LORAZEPAM 2 MG/ML IJ SOLN: 0.5000 mg | Freq: Once | INTRAMUSCULAR | Status: AC

## 2021-02-14 MED ORDER — MORPHINE SULFATE (PF) 2 MG/ML IV SOLN
2.0000 mg | Freq: Once | INTRAVENOUS | Status: AC
  Administered 2021-02-14: 2 mg via INTRAVENOUS
  Filled 2021-02-14: qty 1

## 2021-02-14 NOTE — ED Triage Notes (Signed)
Patient arrived by EMS from home. Sudden onset abdominal pain with N/V that started 6am. 1 episode diarrhea. Recent gallbladder removal X1 month ago. EMS administered '4mg'$  zofran. Blood sugar 99. EMS b/p 96/58, 112P

## 2021-02-14 NOTE — ED Provider Notes (Signed)
[facility] Provider Note  ____________________________________________   Event Date/Time   First MD Initiated Contact with Patient 02/14/21 1142     (approximate)  I have reviewed the triage vital signs and the nursing notes.   HISTORY  Chief Complaint Abdominal Pain and Emesis   HPI Tiffany Guzman is a 25 y.o. female who reports to the emergency department for abrupt onset of severe abdominal pain that began at 6 AM today she reports associated nausea and vomiting.  This was preceded by 3 to 4 days of dysuria.  She denies any recent fevers or chills.  She does endorse associated back pain.  She had recent cholecystectomy roughly 1 month ago, states that this was done in an effort to relieve symptomatic stones, however this is the first recurrence of pain since that time.  She denies any complications from her surgery or healing following surgery.  She attempted Zofran at home with no improvement.  She denies any chest pain, reports mild shortness of breath related to her pain.  She denies any changes in vaginal discharge, denies concern for STDs.  She reports history of similar episodes 2-3 times per year over the last 5 years of unclear etiology.         Past Medical History:  Diagnosis Date   Acne    Anxiety    Attention deficit hyperactivity disorder (ADHD)    Chronic upper back pain    lower and upper back pain   Depression    Dyspnea    GERD (gastroesophageal reflux disease)    Gestational hypertension 11/14/2017   Migraine with aura    since elementary school    Patient Active Problem List   Diagnosis Date Noted   Cholecystitis, acute    Stomach ache    Status post cesarean delivery 11/14/2017   Dermoid cyst 10/03/2017   Dermoid cyst of left ovary 10/01/2017   Anxiety 06/24/2016    Past Surgical History:  Procedure Laterality Date   CESAREAN SECTION N/A 11/14/2017   Procedure: CESAREAN SECTION;  Surgeon:  Will Bonnet, MD;  Location: [facility] ORS;  Service: Obstetrics;  Laterality: N/A;   ESOPHAGOGASTRODUODENOSCOPY Left 02/17/2019   Procedure: ESOPHAGOGASTRODUODENOSCOPY (EGD);  Surgeon: Virgel Manifold, MD;  Location[facility] ENDOSCOPY;  Service: Endoscopy;  Laterality: Left;   INSERTION OF NON VAGINAL CONTRACEPTIVE DEVICE     LAPAROSCOPIC OVARIAN CYSTECTOMY Right 02/23/2019   Procedure: LAPAROSCOPIC OVARIAN CYSTECTOMY;  Surgeon: Homero Fellers, MD;  Location: [facility] ORS;  Service: Gynecology;  Laterality: Right;   OOPHORECTOMY Left 10/03/2017   Procedure: OOPHORECTOMY;  Surgeon: Homero Fellers, MD;  Location: [facility] ORS;  Service: Gynecology;  Laterality: Left;   OVARIAN CYST REMOVAL Left 10/03/2017   Procedure: OVARIAN CYSTECTOMY;  Surgeon: Homero Fellers, MD;  Location: [facility] ORS;  Service: Gynecology;  Laterality: Left;    Prior to Admission medications   Medication Sig Start Date End Date Taking? Authorizing Provider  acetaminophen (TYLENOL) 500 MG tablet Take 2 tablets (1,000 mg total) by mouth every 6 (six) hours as needed for mild pain. 01/16/21   Olean Ree, MD  ibuprofen (ADVIL) 600 MG tablet Take 1 tablet (600 mg total) by mouth every 8 (eight) hours as needed for moderate pain. 01/16/21   Olean Ree, MD  Multiple Vitamins-Minerals (WOMENS MULTIVITAMIN) TABS Take 1 tablet by mouth daily.    [provider]  oxyCODONE (OXY IR/ROXICODONE) 5 MG immediate release tablet Take 1 tablet (5 mg total)  by mouth every 4 (four) hours as needed for severe pain. 01/16/21   Olean Ree, MD    Allergies Buspar [buspirone]  Family History  Problem Relation Age of Onset   Hypertension Mother    Hypothyroidism Mother    Depression Mother    Diabetes Maternal Grandmother     Social History Social History   Tobacco Use   Smoking status: Never   Smokeless tobacco: Never  Vaping Use   Vaping Use: Every day   Start date: 09/20/2019   Substances: Nicotine,  Flavoring  Substance Use Topics   Alcohol use: No    Alcohol/week: 0.0 standard drinks   Drug use: Yes    Frequency: 7.0 times per week    Types: Marijuana    Comment: has stopped for 10 days    Review of Systems Constitutional: No fever/chills Eyes: No visual changes. ENT: No sore throat. Cardiovascular: Denies chest pain. Respiratory: + Mild shortness of breath. Gastrointestinal: + abdominal pain.  + nausea, + vomiting.  No diarrhea.  No constipation. Genitourinary: Negative for dysuria. Musculoskeletal: + Mid to lower back pain Skin: Negative for rash. Neurological: Negative for headaches, focal weakness or numbness.  ____________________________________________   PHYSICAL EXAM:  VITAL SIGNS: ED Triage Vitals  Enc Vitals Group     BP 02/14/21 1133 (!) 130/92     Pulse Rate 02/14/21 1133 91     Resp 02/14/21 1133 18     Temp 02/14/21 1133 98.6 F (37 C)     Temp Source 02/14/21 1133 Oral     SpO2 02/14/21 1133 100 %     Weight 02/14/21 1132 105 lb 13.1 oz (48 kg)     Height 02/14/21 1132 '5\' 4"'$  (1.626 m)     Head Circumference --      Peak Flow --      Pain Score 02/14/21 1132 8     Pain Loc --      Pain Edu? --      Excl. in [place]? --    Constitutional: Alert and oriented.  Anxious appearing, actively vomiting but in no acute distress. Eyes: Conjunctivae are normal. PERRL. EOMI. Head: Atraumatic. Nose: No congestion/rhinnorhea. Mouth/Throat: Mucous membranes are moist.  Oropharynx non-erythematous. Neck: No stridor.   Cardiovascular: Normal rate, regular rhythm. Grossly normal heart sounds.  Good peripheral circulation. Respiratory: Normal respiratory effort.  No retractions. Lungs CTAB. Gastrointestinal: Soft and nondistended.  Diffusely tender to palpation with no guarding, rebound or peritoneal signs. No abdominal bruits.  Mild bilateral CVA tenderness.  Recent surgical incisions appear to be healing well with no drainage, erythema or nearby  fluctuance. Musculoskeletal: No lower extremity tenderness nor edema.  No joint effusions. Neurologic:  Normal speech and language. No gross focal neurologic deficits are appreciated. No gait instability. Skin:  Skin is warm, dry and intact. No rash noted. Psychiatric: Mood and affect are normal. Speech and behavior are normal.  ____________________________________________   LABS (all labs ordered are listed, but only abnormal results are displayed)  Labs Reviewed  COMPREHENSIVE METABOLIC PANEL - Abnormal; Notable for the following components:      Result Value   Potassium 3.4 (*)    CO2 21 (*)    Glucose, Bld 114 (*)    All other components within normal limits  CBC - Abnormal; Notable for the following components:   WBC 15.2 (*)    All other components within normal limits  URINALYSIS, COMPLETE (UACMP) WITH MICROSCOPIC - Abnormal; Notable for the following components:  Color, Urine YELLOW (*)    APPearance CLOUDY (*)    Ketones, ur 80 (*)    Protein, ur 30 (*)    Leukocytes,Ua SMALL (*)    All other components within normal limits  LIPASE, BLOOD  POC URINE PREG, ED   ____________________________________________  EKG  Sinus tachycardia with a rate of 102 bpm.  Mild rightward axis.  Normal QTC of 463.  No ST elevations or depressions, no significant T wave abnormalities.  ____________________________________________   INITIAL IMPRESSION / ASSESSMENT AND PLAN / ED COURSE  As part of my medical decision making, I reviewed the following data within the [place] notes reviewed and incorporated, Labs reviewed, Evaluated by EM attending Dr. Cherylann Banas, and Notes from prior ED visits        Patient is a 25 year old female who reports to the emergency department for generalized abdominal pain with emesis that began abruptly at 6 AM.  See HPI for further details.  In triage, patient has grossly normal vital signs.  Physical exam as above, grossly  reassuring with no rebound tenderness, peritoneal signs or localized pain.  Patient did receive Zofran at home as well as Zofran with EMS and was unable to quit vomiting.  She was then given half milligram of Ativan and had persistent vomiting.  She has a normal QTC, and thus was given a small dose of Haldol with immediate resolution of her vomiting.  She also reported immediate resolution of her abdominal pain.  CBC demonstrates leukocytosis of 15.2, very mild hypokalemia of 3.4 present on CMP, lipase within normal limits.  The patient did take quite a while to produce a urine sample until multiple bags of fluid were complete.  This demonstrates proteinuria and ketonuria with small leukocytes with no bacteria.   Patient does endorse to me that she is a daily marijuana smoker.  She states that while she is have the symptoms intermittently over the last 5 years, she did note that they were significantly improved when she had quit smoking marijuana for 11 months but that she then began again for an unclear reason.  Given the complete resolution of her symptoms with Haldol, feel this is most likely cause of her abdominal pain and vomiting today.  Did discuss return precautions, particularly for fever, worsening pain, repeat emesis.  Advised discontinuation of marijuana given that it produces the symptoms.  Patient acknowledges and is in agreement, and is stable this time for outpatient follow-up.     ____________________________________________   FINAL CLINICAL IMPRESSION(S) / ED DIAGNOSES  Final diagnoses:  Generalized abdominal pain  Cyclical vomiting syndrome not associated with migraine     ED Discharge Orders     None        Note:  This document was prepared using Dragon voice recognition software and may include unintentional dictation errors.    Marlana Salvage, PA 02/14/21 2027    Arta Silence, MD 02/15/21 [phone]

## 2021-02-14 NOTE — Discharge Instructions (Addendum)
Please continue fluid hydration over the next several days.  Return to the emergency department if you experience any return of your abdominal pain or vomiting.  Otherwise, you can follow-up with your primary care.

## 2021-02-15 ENCOUNTER — Telehealth: Payer: Self-pay

## 2021-02-15 NOTE — Telephone Encounter (Signed)
Transition Care Management Unsuccessful Follow-up Telephone Call  Date of discharge and from where:  02/14/2021-[facility]  Attempts:  1st Attempt  Reason for unsuccessful TCM follow-up call:  Unable to leave message

## 2021-02-16 NOTE — Telephone Encounter (Signed)
Transition Care Management Follow-up Telephone Call Date of discharge and from where: 02/14/2021-[facility] How have you been since you were released from the hospital? Patient stated she is doing fine.  Any questions or concerns? No  Items Reviewed: Did the pt receive and understand the discharge instructions provided? Yes  Medications obtained and verified?  No medications given at discharge Other? No  Any new allergies since your discharge? No  Dietary orders reviewed? N/A Do you have support at home? Yes   Home Care and Equipment/Supplies: Were home health services ordered? not applicable If so, what is the name of the agency? N/A  Has the agency set up a time to come to the patient's home? not applicable Were any new equipment or medical supplies ordered?  No What is the name of the medical supply agency? N/A Were you able to get the supplies/equipment? not applicable Do you have any questions related to the use of the equipment or supplies? No  Functional Questionnaire: (I = Independent and D = Dependent) ADLs: I  Bathing/Dressing- I  Meal Prep- I  Eating- I  Maintaining continence- I  Transferring/Ambulation- I  Managing Meds- I  Follow up appointments reviewed:  PCP Hospital f/u appt confirmed? No   Specialist Hospital f/u appt confirmed? No   Are transportation arrangements needed? No  If their condition worsens, is the pt aware to call PCP or go to the Emergency Dept.? Yes Was the patient provided with contact information for the PCP's office or ED? Yes Was to pt encouraged to call back with questions or concerns? Yes

## 2021-02-24 ENCOUNTER — Other Ambulatory Visit: Payer: Self-pay

## 2021-02-24 ENCOUNTER — Emergency Department: Payer: Medicaid Other

## 2021-02-24 ENCOUNTER — Emergency Department
Admission: EM | Admit: 2021-02-24 | Discharge: 2021-02-24 | Disposition: A | Discharge status: Home / Self Care | Payer: Medicaid Other | Attending: Emergency Medicine | Admitting: Emergency Medicine

## 2021-02-24 DIAGNOSIS — R102 Pelvic and perineal pain: Secondary | ICD-10-CM | POA: Diagnosis not present

## 2021-02-24 DIAGNOSIS — K760 Fatty (change of) liver, not elsewhere classified: Secondary | ICD-10-CM | POA: Diagnosis not present

## 2021-02-24 DIAGNOSIS — N83201 Unspecified ovarian cyst, right side: Secondary | ICD-10-CM | POA: Diagnosis not present

## 2021-02-24 DIAGNOSIS — R1084 Generalized abdominal pain: Secondary | ICD-10-CM | POA: Insufficient documentation

## 2021-02-24 DIAGNOSIS — R109 Unspecified abdominal pain: Secondary | ICD-10-CM | POA: Diagnosis not present

## 2021-02-24 DIAGNOSIS — R1031 Right lower quadrant pain: Secondary | ICD-10-CM | POA: Diagnosis not present

## 2021-02-24 DIAGNOSIS — N83291 Other ovarian cyst, right side: Secondary | ICD-10-CM | POA: Diagnosis not present

## 2021-02-24 DIAGNOSIS — R1110 Vomiting, unspecified: Secondary | ICD-10-CM

## 2021-02-24 DIAGNOSIS — D72829 Elevated white blood cell count, unspecified: Secondary | ICD-10-CM | POA: Diagnosis not present

## 2021-02-24 DIAGNOSIS — R112 Nausea with vomiting, unspecified: Secondary | ICD-10-CM | POA: Insufficient documentation

## 2021-02-24 LAB — COMPREHENSIVE METABOLIC PANEL
ALT: 20 U/L (ref 0–44)
AST: 20 U/L (ref 15–41)
Albumin: 4.9 g/dL (ref 3.5–5.0)
Alkaline Phosphatase: 66 U/L (ref 38–126)
Anion gap: 13 (ref 5–15)
BUN: 11 mg/dL (ref 6–20)
CO2: 22 mmol/L (ref 22–32)
Calcium: 9.3 mg/dL (ref 8.9–10.3)
Chloride: 105 mmol/L (ref 98–111)
Creatinine, Ser: 0.77 mg/dL (ref 0.44–1.00)
GFR, Estimated: >60 mL/min (ref >60)
Glucose, Bld: 98 mg/dL (ref 70–99)
Potassium: 3.9 mmol/L (ref 3.5–5.1)
Sodium: 140 mmol/L (ref 135–145)
Total Bilirubin: 1.2 mg/dL (ref 0.3–1.2)
Total Protein: 8.3 g/dL — ABNORMAL HIGH (ref 6.5–8.1)

## 2021-02-24 LAB — CBC
HCT: 40.5 % (ref 36.0–46.0)
Hemoglobin: 13.6 g/dL (ref 12.0–15.0)
MCH: 28.8 pg (ref 26.0–34.0)
MCHC: 33.6 g/dL (ref 30.0–36.0)
MCV: 85.8 fL (ref 80.0–100.0)
Platelets: 312 10*3/uL (ref 150–400)
RBC: 4.72 MIL/uL (ref 3.87–5.11)
RDW: 13.6 % (ref 11.5–15.5)
WBC: 10.4 10*3/uL (ref 4.0–10.5)
nRBC: 0 % (ref 0.0–0.2)

## 2021-02-24 LAB — URINALYSIS, COMPLETE (UACMP) WITH MICROSCOPIC
Bilirubin Urine: NEGATIVE
Glucose, UA: NEGATIVE mg/dL
Ketones, ur: 80 mg/dL — AB
Leukocytes,Ua: NEGATIVE
Nitrite: NEGATIVE
Protein, ur: NEGATIVE mg/dL
Specific Gravity, Urine: >1.046 — ABNORMAL HIGH (ref 1.005–1.030)
pH: 5 (ref 5.0–8.0)

## 2021-02-24 LAB — LIPASE, BLOOD: Lipase: 31 U/L (ref 11–51)

## 2021-02-24 LAB — HCG, QUANTITATIVE, PREGNANCY: hCG, Beta Chain, Quant, S: <1 m[IU]/mL (ref <5)

## 2021-02-24 MED ORDER — SODIUM CHLORIDE 0.9 % IV SOLN: Freq: Once | INTRAVENOUS | Status: AC

## 2021-02-24 MED ORDER — PROCHLORPERAZINE EDISYLATE 10 MG/2ML IJ SOLN
10.0000 mg | Freq: Once | INTRAMUSCULAR | Status: AC
  Administered 2021-02-24: 10 mg via INTRAVENOUS
  Filled 2021-02-24: qty 2

## 2021-02-24 MED ORDER — PROMETHAZINE HCL 12.5 MG PO TABS: 12.5000 mg | ORAL_TABLET | Freq: Four times a day (QID) | ORAL | 0 refills | Status: DC | PRN

## 2021-02-24 MED ORDER — MORPHINE SULFATE (PF) 2 MG/ML IV SOLN
2.0000 mg | Freq: Once | INTRAVENOUS | Status: AC
  Administered 2021-02-24: 2 mg via INTRAVENOUS
  Filled 2021-02-24: qty 1

## 2021-02-24 MED ORDER — DEXTROSE 5 % IN LACTATED RINGERS IV BOLUS
1000.0000 mL | Freq: Once | INTRAVENOUS | Status: AC
  Administered 2021-02-24: 1000 mL via INTRAVENOUS
  Filled 2021-02-24: qty 1000

## 2021-02-24 MED ORDER — MORPHINE SULFATE (PF) 4 MG/ML IV SOLN: 4.0000 mg | Freq: Once | INTRAVENOUS | Status: DC

## 2021-02-24 MED ORDER — IOHEXOL 9 MG/ML PO SOLN
500.0000 mL | Freq: Once | ORAL | Status: DC | PRN
  Administered 2021-02-24: 500 mL via ORAL

## 2021-02-24 MED ORDER — IOHEXOL 350 MG/ML SOLN
75.0000 mL | Freq: Once | INTRAVENOUS | Status: AC | PRN
  Administered 2021-02-24: 60 mL via INTRAVENOUS

## 2021-02-24 MED ORDER — HALOPERIDOL LACTATE 5 MG/ML IJ SOLN
5.0000 mg | Freq: Once | INTRAMUSCULAR | Status: AC
  Administered 2021-02-24: 5 mg via INTRAVENOUS
  Filled 2021-02-24: qty 1

## 2021-02-24 NOTE — Discharge Instructions (Signed)
Please try to avoid any further marijuana.  You seem to be very sensitive to it.  Please use the Compazine 1 3 times a day as needed for nausea.  When you get home try sips of clear liquids for 4 5 hours.  After that you can try the B.R.A.T. diet which is bananas rice applesauce and toast.  Please use small amounts of that frequently.  After 4 5 hours go back to regular food.  Please return for fever, unable to keep down fluids or feeling sicker.

## 2021-02-24 NOTE — ED Notes (Signed)
Pt finished with ultrasound, gave some ice chips

## 2021-02-24 NOTE — ED Provider Notes (Signed)
Received in signout from Dr. Rip Harbour pending imaging.  Imaging with notable right ovarian cyst without evidence of torsion.  She not have any discharge no white count and no fever.  Does not seem consistent with PID or TOA.  States she has a known history of a right ovarian cyst.  Does not have any pain or tenderness on exam at this time.  Has follow-up with OB with GYN.  Do believe that pelvic exam can be deferred given the likely chronic finding without sx of PID or TOA.  Patient tolerating p.o. does appear stable and appropriate for outpatient follow-up.   Merlyn Lot, MD 02/24/21 [phone]

## 2021-02-24 NOTE — ED Notes (Signed)
Alert and oriented x4 no questions of discharge instructions, verbalized understanding

## 2021-02-24 NOTE — ED Triage Notes (Signed)
Pt states that she is having abd pain with n/v and feeling dehydrated- pt states she also dizzy and weak- pt states that she had her gallbladder removed 1 month ago- pt states she was in the hospital for same about 2 weeks ago

## 2021-02-24 NOTE — ED Provider Notes (Addendum)
[facility] Emergency Department Provider Note  ____________________________________________   Event Date/Time   First MD Initiated Contact with Patient 02/24/21 1046     (approximate)  I have reviewed the triage vital signs and the nursing notes.   HISTORY  Chief Complaint Abdominal Pain   HPI Tiffany Guzman is a 25 y.o. female who reports onset of fairly severe crampy abdominal pain and vomiting.  Patient reports she feels weak and dizzy.  She had her gallbladder removed about a month ago and some symptoms improved she had stopped smoking marijuana but then came in the hospital about 2 weeks ago with the same symptoms and she has the symptoms returning again now.  She is smoking marijuana again but only a couple blocks a day she says.  Patient is currently in distress shaking and moaning and complaining of severe crampy abdominal pain.  It is not made worse with palpation does not appear to radiate.  It is diffuse in her belly.        Past Medical History:  Diagnosis Date   Acne    Anxiety    Attention deficit hyperactivity disorder (ADHD)    Chronic upper back pain    lower and upper back pain   Depression    Dyspnea    GERD (gastroesophageal reflux disease)    Gestational hypertension 11/14/2017   Migraine with aura    since elementary school    Patient Active Problem List   Diagnosis Date Noted   Cholecystitis, acute    Stomach ache    Status post cesarean delivery 11/14/2017   Dermoid cyst 10/03/2017   Dermoid cyst of left ovary 10/01/2017   Anxiety 06/24/2016    Past Surgical History:  Procedure Laterality Date   CESAREAN SECTION N/A 11/14/2017   Procedure: CESAREAN SECTION;  Surgeon: Will Bonnet, MD;  Location: [facility] ORS;  Service: Obstetrics;  Laterality: N/A;   ESOPHAGOGASTRODUODENOSCOPY Left 02/17/2019   Procedure: ESOPHAGOGASTRODUODENOSCOPY (EGD);  Surgeon: Virgel Manifold, MD;  Location: [facility] ENDOSCOPY;   Service: Endoscopy;  Laterality: Left;   INSERTION OF NON VAGINAL CONTRACEPTIVE DEVICE     LAPAROSCOPIC OVARIAN CYSTECTOMY Right 02/23/2019   Procedure: LAPAROSCOPIC OVARIAN CYSTECTOMY;  Surgeon: Homero Fellers, MD;  Location: [facility] ORS;  Service: Gynecology;  Laterality: Right;   OOPHORECTOMY Left 10/03/2017   Procedure: OOPHORECTOMY;  Surgeon: Homero Fellers, MD;  Location: [facility] ORS;  Service: Gynecology;  Laterality: Left;   OVARIAN CYST REMOVAL Left 10/03/2017   Procedure: OVARIAN CYSTECTOMY;  Surgeon: Homero Fellers, MD;  Location: [facility] ORS;  Service: Gynecology;  Laterality: Left;    Prior to Admission medications   Medication Sig Start Date End Date Taking? Authorizing Provider  acetaminophen (TYLENOL) 500 MG tablet Take 2 tablets (1,000 mg total) by mouth every 6 (six) hours as needed for mild pain. 01/16/21   Olean Ree, MD  ibuprofen (ADVIL) 600 MG tablet Take 1 tablet (600 mg total) by mouth every 8 (eight) hours as needed for moderate pain. 01/16/21   Olean Ree, MD  Multiple Vitamins-Minerals (WOMENS MULTIVITAMIN) TABS Take 1 tablet by mouth daily.    [provider]  oxyCODONE (OXY IR/ROXICODONE) 5 MG immediate release tablet Take 1 tablet (5 mg total) by mouth every 4 (four) hours as needed for severe pain. 01/16/21   Olean Ree, MD    Allergies Buspar [buspirone]  Family History  Problem Relation Age of Onset   Hypertension Mother    Hypothyroidism Mother  Depression Mother    Diabetes Maternal Grandmother     Social History Social History   Tobacco Use   Smoking status: Never   Smokeless tobacco: Never  Vaping Use   Vaping Use: Every day   Start date: 09/20/2019   Substances: Nicotine, Flavoring  Substance Use Topics   Alcohol use: No    Alcohol/week: 0.0 standard drinks   Drug use: Yes    Frequency: 7.0 times per week    Types: Marijuana    Comment: has stopped for 10 days    Review of Systems  Constitutional: No  fever/chills Eyes: No visual changes. ENT: No sore throat. Cardiovascular: Denies chest pain. Respiratory: Denies shortness of breath. Gastrointestinal: abdominal pain.  nausea,  vomiting.  No diarrhea.  No constipation. Genitourinary: Negative for dysuria. Musculoskeletal: Negative for back pain. Skin: Negative for rash. Neurological: Negative for headaches, focal weakness  ____________________________________________   PHYSICAL EXAM:  VITAL SIGNS: ED Triage Vitals  Enc Vitals Group     BP 02/24/21 0940 125/88     Pulse Rate 02/24/21 0940 100     Resp 02/24/21 0940 18     Temp 02/24/21 0940 97.6 F (36.4 C)     Temp Source 02/24/21 0940 Oral     SpO2 02/24/21 0940 100 %     Weight 02/24/21 0939 110 lb (49.9 kg)     Height 02/24/21 0939 '5\' 4"'$  (1.626 m)     Head Circumference --      Peak Flow --      Pain Score 02/24/21 0939 6     Pain Loc --      Pain Edu? --      Excl. in [place]? --     Constitutional: Alert and oriented.  In pain shaking nauseated with dry heaves bringing her knees up to her chest Eyes: Conjunctivae are normal. PER EOMI. Head: Atraumatic. Nose: No congestion/rhinnorhea. Mouth/Throat: Mucous membranes are moist.  Oropharynx non-erythematous. Neck: No stridor. Cardiovascular: Normal rate, regular rhythm. Grossly normal heart sounds.  Good peripheral circulation. Respiratory: Normal respiratory effort.  No retractions. Lungs CTAB. Gastrointestinal: Soft and nontender. No distention. No abdominal bruits. No CVA tenderness. Musculoskeletal: No lower extremity tenderness nor edema.   Neurologic:  Normal speech and language. No gross focal neurologic deficits are appreciated.  Skin:  Skin is warm, dry and intact. No rash noted.   ____________________________________________   LABS (all labs ordered are listed, but only abnormal results are displayed)  Labs Reviewed  COMPREHENSIVE METABOLIC PANEL - Abnormal; Notable for the following components:       Result Value   Total Protein 8.3 (*)    All other components within normal limits  LIPASE, BLOOD  CBC  HCG, QUANTITATIVE, PREGNANCY  URINALYSIS, COMPLETE (UACMP) WITH MICROSCOPIC   ____________________________________________  EKG   ____________________________________________  RADIOLOGY Gertha Calkin, personally viewed and evaluated these images (plain radiographs) as part of my medical decision making, as well as reviewing the written report by the radiologist.  ED MD interpretation:    Official radiology report(s): No results found.  ____________________________________________   PROCEDURES  Procedure(s) performed (including Critical Care):  Procedures   ____________________________________________   INITIAL IMPRESSION / ASSESSMENT AND PLAN / ED COURSE  ----------------------------------------- 12:11 PM on 02/24/2021 ----------------------------------------- Compazine did not seem to help the patient at all but Haldol has made the vomiting stopped.  Patient is not moaning any longer either but she is complaining of pain in her abdomen and is tender in  the area of the umbilicus to palpation.  She still tachycardic as well.  I will get a CT.    ----------------------------------------- 4:10 PM on 02/24/2021 ----------------------------------------- Patient CT is not back.  I will have to sign this out.          ____________________________________________   FINAL CLINICAL IMPRESSION(S) / ED DIAGNOSES  Final diagnoses:  Generalized abdominal pain  Non-intractable vomiting, presence of nausea not specified, unspecified vomiting type     ED Discharge Orders     None        Note:  This document was prepared using Dragon voice recognition software and may include unintentional dictation errors.    Nena Polio, MD 02/24/21 1212    Nena Polio, MD 02/24/21 [phone]

## 2021-02-24 NOTE — ED Notes (Signed)
Patient transported to CT 

## 2021-02-25 ENCOUNTER — Ambulatory Visit
Admission: EM | Admit: 2021-02-25 | Discharge: 2021-02-26 | Disposition: A | Discharge status: Home / Self Care | Payer: Medicaid Other | Attending: Obstetrics and Gynecology | Admitting: Obstetrics and Gynecology

## 2021-02-25 ENCOUNTER — Encounter
Admission: EM | Disposition: A | Discharge status: Home / Self Care | Payer: Self-pay | Source: Home / Self Care | Attending: Emergency Medicine

## 2021-02-25 ENCOUNTER — Emergency Department: Payer: Medicaid Other | Admitting: Anesthesiology

## 2021-02-25 ENCOUNTER — Emergency Department: Payer: Medicaid Other

## 2021-02-25 ENCOUNTER — Other Ambulatory Visit: Payer: Self-pay

## 2021-02-25 DIAGNOSIS — Z8616 Personal history of COVID-19: Secondary | ICD-10-CM | POA: Insufficient documentation

## 2021-02-25 DIAGNOSIS — Z8742 Personal history of other diseases of the female genital tract: Secondary | ICD-10-CM

## 2021-02-25 DIAGNOSIS — R1084 Generalized abdominal pain: Secondary | ICD-10-CM | POA: Diagnosis not present

## 2021-02-25 DIAGNOSIS — F17290 Nicotine dependence, other tobacco product, uncomplicated: Secondary | ICD-10-CM | POA: Diagnosis not present

## 2021-02-25 DIAGNOSIS — N83201 Unspecified ovarian cyst, right side: Secondary | ICD-10-CM | POA: Diagnosis not present

## 2021-02-25 DIAGNOSIS — R112 Nausea with vomiting, unspecified: Secondary | ICD-10-CM | POA: Insufficient documentation

## 2021-02-25 DIAGNOSIS — D72829 Elevated white blood cell count, unspecified: Secondary | ICD-10-CM | POA: Diagnosis present

## 2021-02-25 DIAGNOSIS — R001 Bradycardia, unspecified: Secondary | ICD-10-CM | POA: Diagnosis not present

## 2021-02-25 DIAGNOSIS — R1111 Vomiting without nausea: Secondary | ICD-10-CM | POA: Diagnosis not present

## 2021-02-25 DIAGNOSIS — K810 Acute cholecystitis: Secondary | ICD-10-CM | POA: Diagnosis not present

## 2021-02-25 DIAGNOSIS — N8301 Follicular cyst of right ovary: Secondary | ICD-10-CM | POA: Insufficient documentation

## 2021-02-25 DIAGNOSIS — N83209 Unspecified ovarian cyst, unspecified side: Secondary | ICD-10-CM | POA: Diagnosis not present

## 2021-02-25 DIAGNOSIS — R1031 Right lower quadrant pain: Secondary | ICD-10-CM | POA: Diagnosis not present

## 2021-02-25 DIAGNOSIS — Z90721 Acquired absence of ovaries, unilateral: Secondary | ICD-10-CM | POA: Diagnosis not present

## 2021-02-25 DIAGNOSIS — R102 Pelvic and perineal pain: Secondary | ICD-10-CM

## 2021-02-25 DIAGNOSIS — R52 Pain, unspecified: Secondary | ICD-10-CM | POA: Diagnosis not present

## 2021-02-25 DIAGNOSIS — Z888 Allergy status to other drugs, medicaments and biological substances status: Secondary | ICD-10-CM | POA: Insufficient documentation

## 2021-02-25 HISTORY — PX: OVARIAN CYST REMOVAL: SHX89

## 2021-02-25 HISTORY — PX: LAPAROSCOPY: SHX197

## 2021-02-25 LAB — CBC
HCT: 38 % (ref 36.0–46.0)
Hemoglobin: 12.8 g/dL (ref 12.0–15.0)
MCH: 29.3 pg (ref 26.0–34.0)
MCHC: 33.7 g/dL (ref 30.0–36.0)
MCV: 87 fL (ref 80.0–100.0)
Platelets: 313 10*3/uL (ref 150–400)
RBC: 4.37 MIL/uL (ref 3.87–5.11)
RDW: 14.1 % (ref 11.5–15.5)
WBC: 18.8 10*3/uL — ABNORMAL HIGH (ref 4.0–10.5)
nRBC: 0 % (ref 0.0–0.2)

## 2021-02-25 LAB — CHLAMYDIA/NGC RT PCR (ARMC ONLY)
Chlamydia Tr: NOT DETECTED
N gonorrhoeae: NOT DETECTED

## 2021-02-25 LAB — WET PREP, GENITAL
Clue Cells Wet Prep HPF POC: NONE SEEN
Sperm: NONE SEEN
Trich, Wet Prep: NONE SEEN
Yeast Wet Prep HPF POC: NONE SEEN

## 2021-02-25 LAB — COMPREHENSIVE METABOLIC PANEL
ALT: 22 U/L (ref 0–44)
AST: 19 U/L (ref 15–41)
Albumin: 4.9 g/dL (ref 3.5–5.0)
Alkaline Phosphatase: 63 U/L (ref 38–126)
Anion gap: 10 (ref 5–15)
BUN: 13 mg/dL (ref 6–20)
CO2: 23 mmol/L (ref 22–32)
Calcium: 9.1 mg/dL (ref 8.9–10.3)
Chloride: 105 mmol/L (ref 98–111)
Creatinine, Ser: 0.71 mg/dL (ref 0.44–1.00)
GFR, Estimated: >60 mL/min (ref >60)
Glucose, Bld: 108 mg/dL — ABNORMAL HIGH (ref 70–99)
Potassium: 3.4 mmol/L — ABNORMAL LOW (ref 3.5–5.1)
Sodium: 138 mmol/L (ref 135–145)
Total Bilirubin: 1.1 mg/dL (ref 0.3–1.2)
Total Protein: 7.8 g/dL (ref 6.5–8.1)

## 2021-02-25 LAB — LIPASE, BLOOD: Lipase: 32 U/L (ref 11–51)

## 2021-02-25 IMAGING — US US PELVIS COMPLETE WITH TRANSVAGINAL
1 series · 13 of 25 positions shown · non-contrast
Comparison: Pelvic ultrasound on [DATE], CT of the abdomen and
pelvis on [DATE]

CLINICAL DATA: Pelvic pain for 3-4 days. Previous LEFT
oophorectomy. LMP [DATE].

EXAM:
TRANSABDOMINAL AND TRANSVAGINAL ULTRASOUND OF PELVIS
TECHNIQUE: Both transabdominal and transvaginal ultrasound examinations of the
pelvis were performed. Transabdominal technique was performed for
global imaging of the pelvis including uterus, ovaries, adnexal
regions, and pelvic cul-de-sac. It was necessary to proceed with
endovaginal exam following the transabdominal exam to visualize the
uterus and endometrium, ovaries, adnexal regions.

[Series 1: us pelvic complete with transvaginal · 13 of 123 slices shown]
[im 1/123]
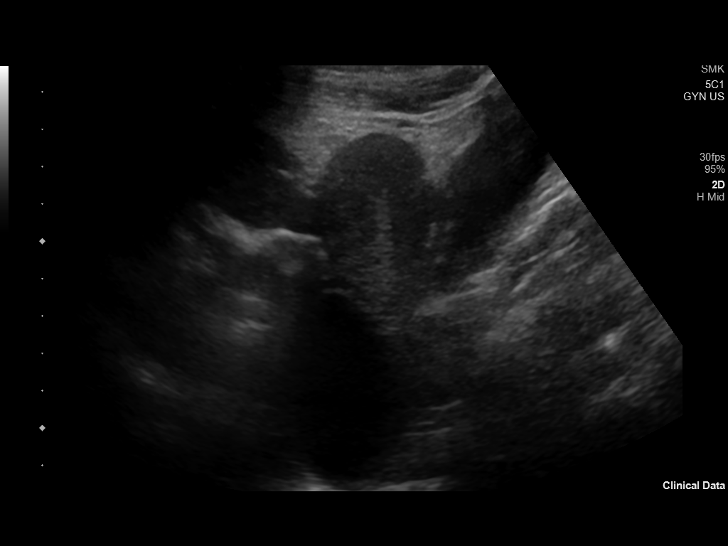
[im 11/123]
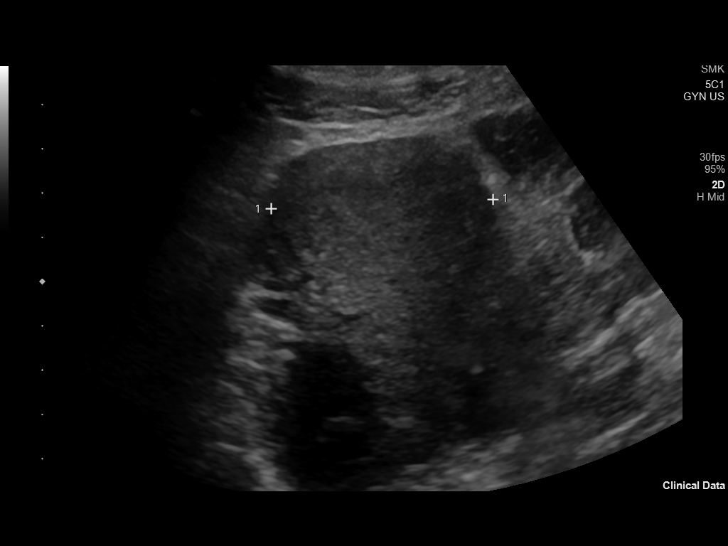
[im 21/123]
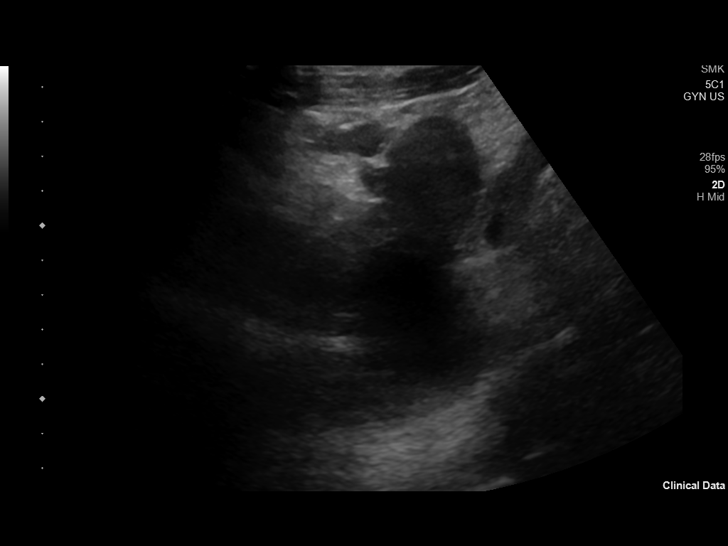
[im 31/123]
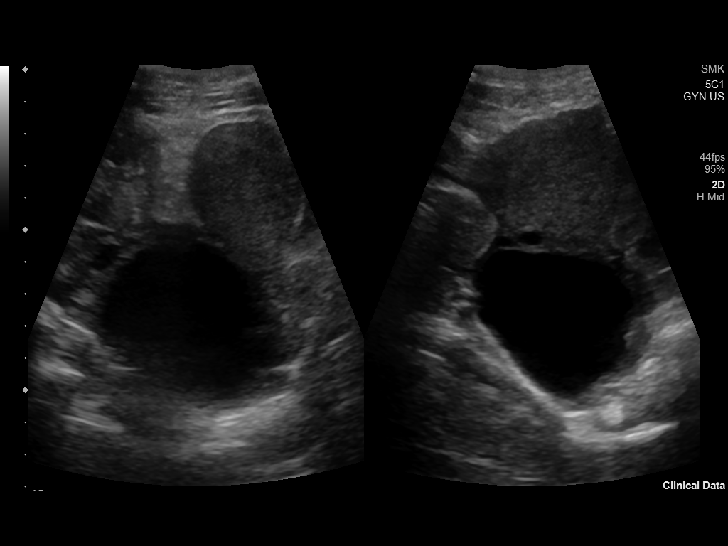
[im 41/123]
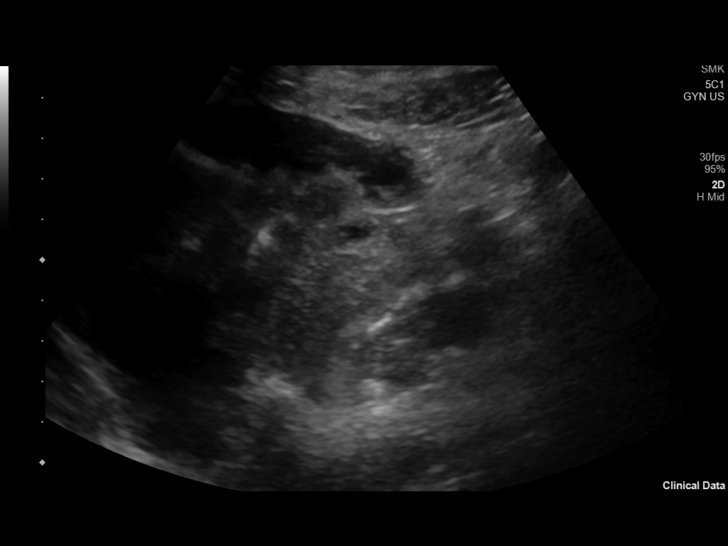
[im 51/123]
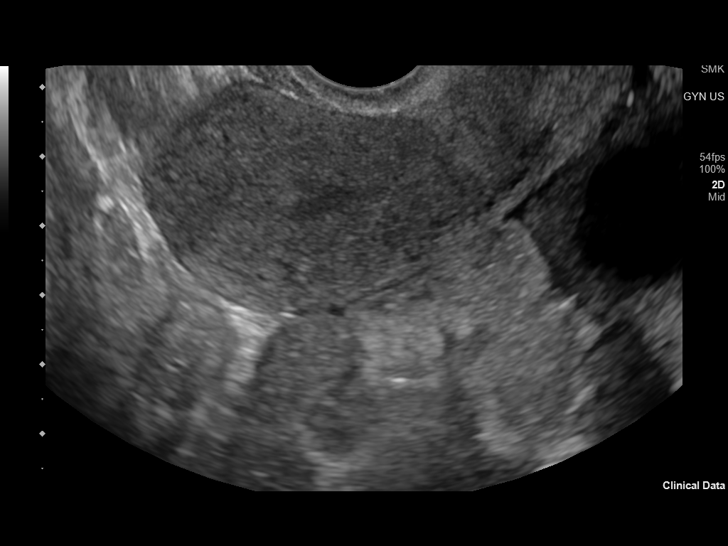
[im 62/123]
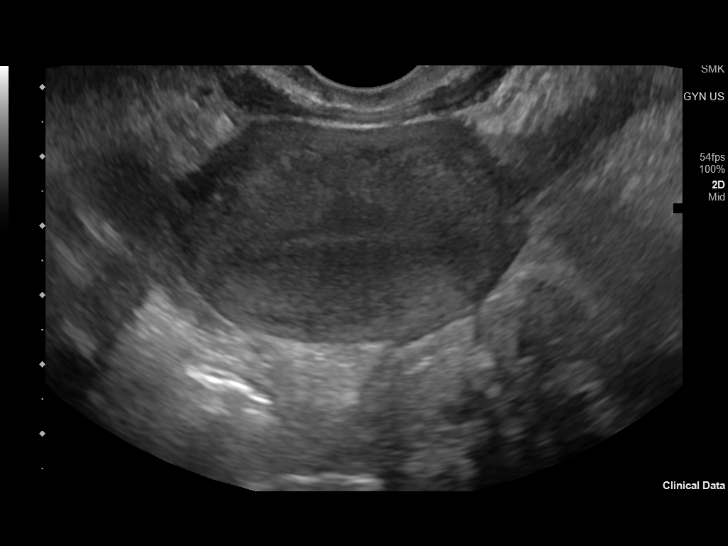
[im 72/123]
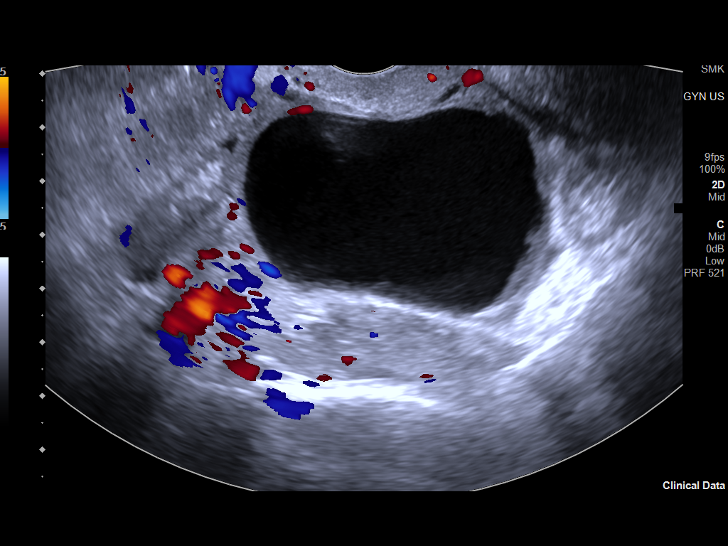
[im 82/123]
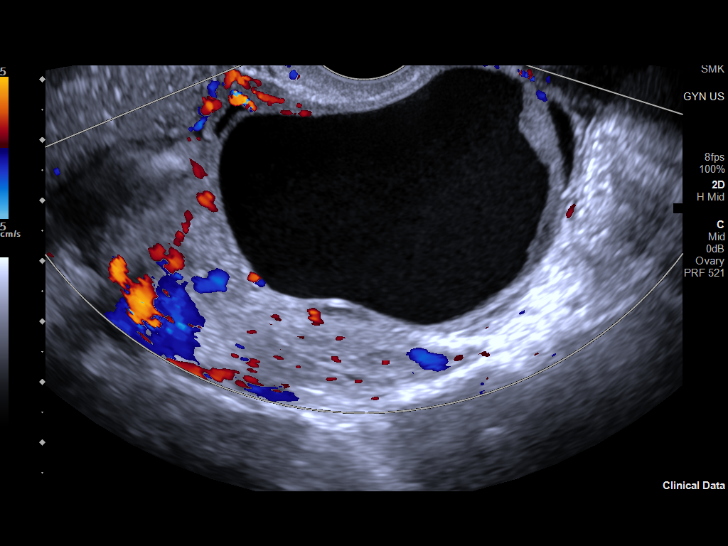
[im 92/123]
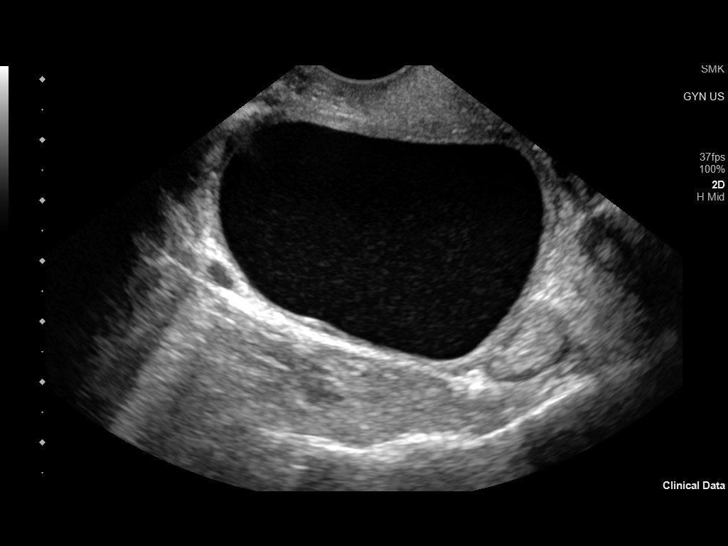
[im 102/123]
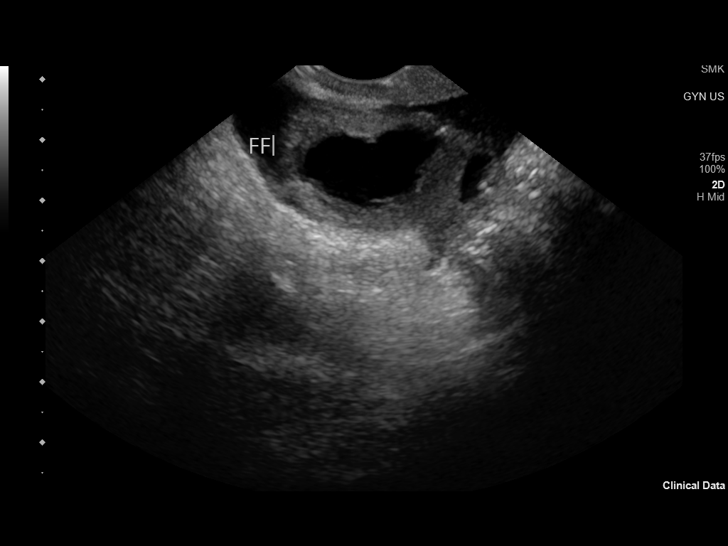
[im 112/123]
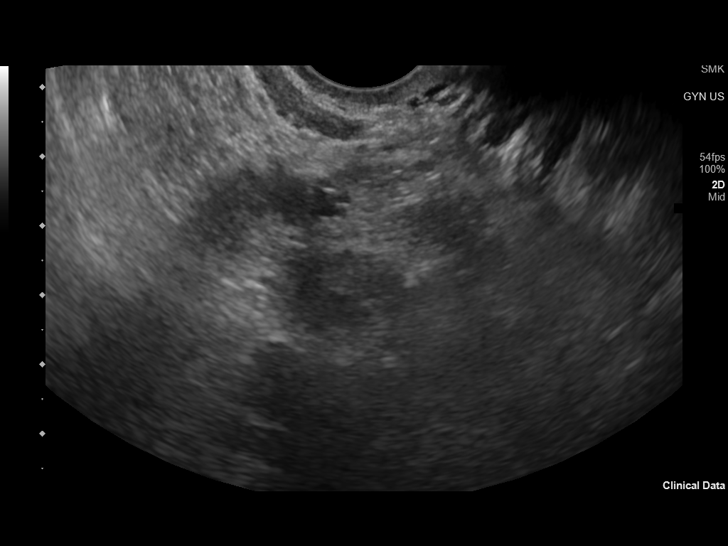
[im 123/123]
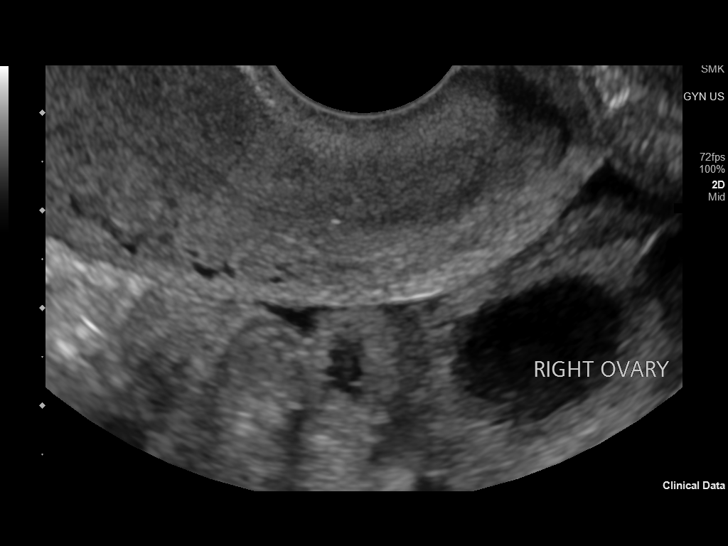

[13 of 25 positions shown; findings below may reference images not displayed]

FINDINGS: Uterus

Measurements: 7.7 x 3.4 x 5.2 centimeters = volume: 70.8 mL. No
fibroids or other mass visualized.

Endometrium

Thickness: 2.4 millimeters.  No focal abnormality visualized.

Right ovary

Measurements: 6.0 x 4.4 x 5.7 centimeters = volume: 79.02 mL. Cystic
area in the RIGHT ovary is 5.8 x 3.8 x 5.3 centimeters. A single
internal septation is present and stable in appearance.

Left ovary

Measurements: Surgically absent

Other findings

Trace free pelvic fluid surrounding the RIGHT ovary. Color Doppler
noted in the RIGHT ovarian stroma.
IMPRESSION: 1. Stable appearance of cyst within the RIGHT ovary. Recommend
follow-up US in 3-6 months. Note: This recommendation does not apply
to premenarchal patients or to those with increased risk (genetic,
family history, elevated tumor markers or other high-risk factors)
of ovarian cancer. Reference: Radiology [DATE]):[id number].
2. No evidence for interval rupture or torsion.
3. Surgically absent LEFT ovary.
4. Normal appearance of the uterus.

## 2021-02-25 SURGERY — LAPAROSCOPY, DIAGNOSTIC
Anesthesia: General | Laterality: Right

## 2021-02-25 MED ORDER — BUPIVACAINE HCL (PF) 0.5 % IJ SOLN
INTRAMUSCULAR | Status: AC
  Filled 2021-02-25: qty 30

## 2021-02-25 MED ORDER — FENTANYL CITRATE (PF) 100 MCG/2ML IJ SOLN
INTRAMUSCULAR | Status: DC | PRN
  Administered 2021-02-25: 50 ug via INTRAVENOUS

## 2021-02-25 MED ORDER — ACETAMINOPHEN 10 MG/ML IV SOLN
INTRAVENOUS | Status: DC | PRN
  Administered 2021-02-25: 1000 mg via INTRAVENOUS

## 2021-02-25 MED ORDER — MORPHINE SULFATE (PF) 4 MG/ML IV SOLN
4.0000 mg | Freq: Once | INTRAVENOUS | Status: AC
  Administered 2021-02-25: 4 mg via INTRAVENOUS
  Filled 2021-02-25: qty 1

## 2021-02-25 MED ORDER — KETOROLAC TROMETHAMINE 30 MG/ML IJ SOLN
INTRAMUSCULAR | Status: AC
  Filled 2021-02-25: qty 1

## 2021-02-25 MED ORDER — ACETAMINOPHEN 10 MG/ML IV SOLN: 1000.0000 mg | Freq: Once | INTRAVENOUS | Status: DC | PRN

## 2021-02-25 MED ORDER — ROCURONIUM BROMIDE 100 MG/10ML IV SOLN
INTRAVENOUS | Status: DC | PRN
  Administered 2021-02-25: 25 mg via INTRAVENOUS
  Administered 2021-02-25: 5 mg via INTRAVENOUS

## 2021-02-25 MED ORDER — PROPOFOL 10 MG/ML IV BOLUS
INTRAVENOUS | Status: DC | PRN
  Administered 2021-02-25: 100 mg via INTRAVENOUS

## 2021-02-25 MED ORDER — DEXMEDETOMIDINE HCL IN NACL 200 MCG/50ML IV SOLN
INTRAVENOUS | Status: AC
  Filled 2021-02-25: qty 50

## 2021-02-25 MED ORDER — BUPIVACAINE HCL 0.5 % IJ SOLN
INTRAMUSCULAR | Status: DC | PRN
  Administered 2021-02-25: 8 mL

## 2021-02-25 MED ORDER — LACTATED RINGERS IV BOLUS
1000.0000 mL | Freq: Once | INTRAVENOUS | Status: AC
  Administered 2021-02-25: 1000 mL via INTRAVENOUS

## 2021-02-25 MED ORDER — SUCCINYLCHOLINE CHLORIDE 200 MG/10ML IV SOSY
PREFILLED_SYRINGE | INTRAVENOUS | Status: AC
  Filled 2021-02-25: qty 10

## 2021-02-25 MED ORDER — SUGAMMADEX SODIUM 200 MG/2ML IV SOLN
INTRAVENOUS | Status: DC | PRN
  Administered 2021-02-25: 200 mg via INTRAVENOUS

## 2021-02-25 MED ORDER — MIDAZOLAM HCL 5 MG/5ML IJ SOLN
INTRAMUSCULAR | Status: DC | PRN
  Administered 2021-02-25 (×2): 1 mg via INTRAVENOUS

## 2021-02-25 MED ORDER — FENTANYL CITRATE (PF) 100 MCG/2ML IJ SOLN
INTRAMUSCULAR | Status: AC
  Filled 2021-02-25: qty 2

## 2021-02-25 MED ORDER — SODIUM CHLORIDE 0.9 % IV SOLN
1.0000 g | Freq: Once | INTRAVENOUS | Status: AC
  Administered 2021-02-25: 1 g via INTRAVENOUS
  Filled 2021-02-25: qty 10

## 2021-02-25 MED ORDER — ACETAMINOPHEN 10 MG/ML IV SOLN
INTRAVENOUS | Status: AC
  Filled 2021-02-25: qty 100

## 2021-02-25 MED ORDER — FENTANYL CITRATE (PF) 100 MCG/2ML IJ SOLN
INTRAMUSCULAR | Status: AC
  Administered 2021-02-25: 25 ug via INTRAVENOUS
  Filled 2021-02-25: qty 2

## 2021-02-25 MED ORDER — ONDANSETRON HCL 4 MG/2ML IJ SOLN: 4.0000 mg | Freq: Once | INTRAMUSCULAR | Status: DC | PRN

## 2021-02-25 MED ORDER — ONDANSETRON HCL 4 MG/2ML IJ SOLN
INTRAMUSCULAR | Status: DC | PRN
  Administered 2021-02-25: 4 mg via INTRAVENOUS

## 2021-02-25 MED ORDER — LACTATED RINGERS IV SOLN: INTRAVENOUS | Status: DC | PRN

## 2021-02-25 MED ORDER — SODIUM CHLORIDE 0.9 % IV SOLN
12.5000 mg | Freq: Once | INTRAVENOUS | Status: AC
  Administered 2021-02-25: 12.5 mg via INTRAVENOUS
  Filled 2021-02-25: qty 12.5

## 2021-02-25 MED ORDER — KETOROLAC TROMETHAMINE 30 MG/ML IJ SOLN
INTRAMUSCULAR | Status: DC | PRN
  Administered 2021-02-25: 30 mg via INTRAVENOUS

## 2021-02-25 MED ORDER — DEXAMETHASONE SODIUM PHOSPHATE 10 MG/ML IJ SOLN
INTRAMUSCULAR | Status: DC | PRN
  Administered 2021-02-25: 10 mg via INTRAVENOUS

## 2021-02-25 MED ORDER — ROCURONIUM BROMIDE 10 MG/ML (PF) SYRINGE
PREFILLED_SYRINGE | INTRAVENOUS | Status: AC
  Filled 2021-02-25: qty 10

## 2021-02-25 MED ORDER — LIDOCAINE HCL (PF) 2 % IJ SOLN
INTRAMUSCULAR | Status: AC
  Filled 2021-02-25: qty 5

## 2021-02-25 MED ORDER — DEXMEDETOMIDINE (PRECEDEX) IN NS 20 MCG/5ML (4 MCG/ML) IV SYRINGE
PREFILLED_SYRINGE | INTRAVENOUS | Status: DC | PRN
  Administered 2021-02-25: 8 ug via INTRAVENOUS

## 2021-02-25 MED ORDER — DEXAMETHASONE SODIUM PHOSPHATE 10 MG/ML IJ SOLN
INTRAMUSCULAR | Status: AC
  Filled 2021-02-25: qty 1

## 2021-02-25 MED ORDER — MORPHINE SULFATE (PF) 2 MG/ML IV SOLN
2.0000 mg | Freq: Once | INTRAVENOUS | Status: AC
  Administered 2021-02-25: 2 mg via INTRAVENOUS
  Filled 2021-02-25: qty 1

## 2021-02-25 MED ORDER — SUCCINYLCHOLINE CHLORIDE 200 MG/10ML IV SOSY
PREFILLED_SYRINGE | INTRAVENOUS | Status: DC | PRN
  Administered 2021-02-25: 100 mg via INTRAVENOUS

## 2021-02-25 MED ORDER — METRONIDAZOLE 500 MG/100ML IV SOLN
500.0000 mg | Freq: Once | INTRAVENOUS | Status: AC
  Administered 2021-02-25: 500 mg via INTRAVENOUS
  Filled 2021-02-25: qty 100

## 2021-02-25 MED ORDER — SODIUM CHLORIDE 0.9 % IV SOLN
100.0000 mg | Freq: Two times a day (BID) | INTRAVENOUS | Status: DC
  Administered 2021-02-25: 100 mg via INTRAVENOUS
  Filled 2021-02-25 (×2): qty 100

## 2021-02-25 MED ORDER — FENTANYL CITRATE (PF) 100 MCG/2ML IJ SOLN
25.0000 ug | INTRAMUSCULAR | Status: DC | PRN
  Administered 2021-02-25 – 2021-02-26 (×3): 25 ug via INTRAVENOUS

## 2021-02-25 MED ORDER — ONDANSETRON HCL 4 MG/2ML IJ SOLN
INTRAMUSCULAR | Status: AC
  Filled 2021-02-25: qty 2

## 2021-02-25 MED ORDER — LIDOCAINE HCL (CARDIAC) PF 100 MG/5ML IV SOSY
PREFILLED_SYRINGE | INTRAVENOUS | Status: DC | PRN
  Administered 2021-02-25: 100 mg via INTRATRACHEAL

## 2021-02-25 MED ORDER — MIDAZOLAM HCL 2 MG/2ML IJ SOLN
INTRAMUSCULAR | Status: AC
  Filled 2021-02-25: qty 2

## 2021-02-25 SURGICAL SUPPLY — 60 items
ADH SKN CLS APL DERMABOND .7 (GAUZE/BANDAGES/DRESSINGS) ×2
ANCHOR TIS RET SYS 235ML (MISCELLANEOUS) IMPLANT
APL PRP STRL LF DISP 70% ISPRP (MISCELLANEOUS) ×2
APL SRG 38 LTWT LNG FL B (MISCELLANEOUS)
APPLICATOR ARISTA FLEXITIP XL (MISCELLANEOUS) IMPLANT
BACTOSHIELD CHG 4% 4OZ (MISCELLANEOUS) ×1
BAG DRN RND TRDRP ANRFLXCHMBR (UROLOGICAL SUPPLIES) ×2
BAG TISS RTRVL C235 10X14 (MISCELLANEOUS)
BAG URINE DRAIN 2000ML AR STRL (UROLOGICAL SUPPLIES) ×3 IMPLANT
BLADE SURG SZ11 CARB STEEL (BLADE) ×3 IMPLANT
CANISTER SUCT 1200ML W/VALVE (MISCELLANEOUS) ×3 IMPLANT
CATH FOLEY 2WAY  5CC 16FR (CATHETERS) ×1
CATH FOLEY 2WAY 5CC 16FR (CATHETERS) ×2
CATH URTH 16FR FL 2W BLN LF (CATHETERS) ×2 IMPLANT
CHLORAPREP W/TINT 26 (MISCELLANEOUS) ×3 IMPLANT
DERMABOND ADVANCED (GAUZE/BANDAGES/DRESSINGS) ×1
DERMABOND ADVANCED .7 DNX12 (GAUZE/BANDAGES/DRESSINGS) ×2 IMPLANT
DRAPE 3/4 80X56 (DRAPES) ×3 IMPLANT
DRAPE LEGGINS SURG 28X43 STRL (DRAPES) ×3 IMPLANT
DRAPE UNDER BUTTOCK W/FLU (DRAPES) ×3 IMPLANT
ELECT REM PT RETURN 9FT ADLT (ELECTROSURGICAL) ×3
ELECTRODE REM PT RTRN 9FT ADLT (ELECTROSURGICAL) ×2 IMPLANT
GAUZE 4X4 16PLY ~~LOC~~+RFID DBL (SPONGE) ×3 IMPLANT
GLOVE SURG ENC MOIS LTX SZ7 (GLOVE) ×6 IMPLANT
GLOVE SURG UNDER POLY LF SZ7.5 (GLOVE) ×3 IMPLANT
GOWN STRL REUS W/ TWL LRG LVL3 (GOWN DISPOSABLE) ×4 IMPLANT
GOWN STRL REUS W/TWL LRG LVL3 (GOWN DISPOSABLE) ×6
GRASPER SUT TROCAR 14GX15 (MISCELLANEOUS) IMPLANT
HEMOSTAT ARISTA ABSORB 3G PWDR (HEMOSTASIS) ×3 IMPLANT
IRRIGATION STRYKERFLOW (MISCELLANEOUS) ×2 IMPLANT
IRRIGATOR STRYKERFLOW (MISCELLANEOUS) ×3
IV LACTATED RINGERS 1000ML (IV SOLUTION) IMPLANT
KIT PINK PAD W/HEAD ARE REST (MISCELLANEOUS) ×3 IMPLANT
KIT PINK PAD W/HEAD ARM REST (MISCELLANEOUS) ×2 IMPLANT
KIT TURNOVER CYSTO (KITS) ×3 IMPLANT
LABEL OR SOLS (LABEL) ×3 IMPLANT
LIGASURE VESSEL 5MM BLUNT TIP (ELECTROSURGICAL) IMPLANT
MANIFOLD NEPTUNE II (INSTRUMENTS) ×3 IMPLANT
MANIPULATOR UTERINE 4.5 ZUMI (MISCELLANEOUS) IMPLANT
NEEDLE HYPO 22GX1.5 SAFETY (NEEDLE) ×3 IMPLANT
NS IRRIG 500ML POUR BTL (IV SOLUTION) ×3 IMPLANT
PACK LAP CHOLECYSTECTOMY (MISCELLANEOUS) ×3 IMPLANT
PAD OB MATERNITY 4.3X12.25 (PERSONAL CARE ITEMS) ×3 IMPLANT
PAD PREP 24X41 OB/GYN DISP (PERSONAL CARE ITEMS) ×3 IMPLANT
SCISSORS METZENBAUM CVD 33 (INSTRUMENTS) IMPLANT
SCRUB CHG 4% DYNA-HEX 4OZ (MISCELLANEOUS) ×2 IMPLANT
SET TUBE SMOKE EVAC HIGH FLOW (TUBING) ×3 IMPLANT
SHEARS HARMONIC ACE PLUS 36CM (ENDOMECHANICALS) IMPLANT
SLEEVE ENDOPATH XCEL 5M (ENDOMECHANICALS) ×3 IMPLANT
SOL PREP PVP 2OZ (MISCELLANEOUS) ×3
SOLUTION PREP PVP 2OZ (MISCELLANEOUS) ×2 IMPLANT
SURGILUBE 2OZ TUBE FLIPTOP (MISCELLANEOUS) ×3 IMPLANT
SUT MNCRL 4-0 (SUTURE)
SUT MNCRL 4-0 27XMFL (SUTURE)
SUT VIC AB 2-0 UR6 27 (SUTURE) IMPLANT
SUT VICRYL 0 AB UR-6 (SUTURE) IMPLANT
SUTURE MNCRL 4-0 27XMF (SUTURE) IMPLANT
TROCAR ENDO BLADELESS 11MM (ENDOMECHANICALS) IMPLANT
TROCAR XCEL NON-BLD 5MMX100MML (ENDOMECHANICALS) ×3 IMPLANT
TROCAR XCEL UNIV SLVE 11M 100M (ENDOMECHANICALS) IMPLANT

## 2021-02-25 NOTE — Transfer of Care (Signed)
Immediate Anesthesia Transfer of Care Note  Patient: Tiffany Guzman  Procedure(s) Performed: LAPAROSCOPY DIAGNOSTIC OVARIAN CYSTECTOMY (Right)  Patient Location: PACU  Anesthesia Type:General  Level of Consciousness: sedated  Airway & Oxygen Therapy: Patient Spontanous Breathing and Patient connected to face mask oxygen  Post-op Assessment: Report given to RN and Post -op Vital signs reviewed and stable  Post vital signs: Reviewed and stable  Last Vitals:  Vitals Value Taken Time  BP 119/74 02/25/21 2330  Temp 36.7 C 02/25/21 2330  Pulse 58 02/25/21 2332  Resp 20 02/25/21 2332  SpO2 100 % 02/25/21 2332  Vitals shown include unvalidated device data.  Last Pain:  Vitals:   02/25/21 2047  TempSrc:   PainSc: 4          Complications: No notable events documented.

## 2021-02-25 NOTE — ED Provider Notes (Signed)
[facility] Emergency Department Provider Note  ____________________________________________  Time seen: Approximately 4:40 PM  I have reviewed the triage vital signs and the nursing notes.   HISTORY  Chief Complaint Pelvic Pain    HPI Tiffany Guzman is a 25 y.o. female presents to the emergency department for treatment and evaluation of right lower pelvic pain.  Patient was seen here yesterday for the same and discharged home to follow-up with gynecology.  Patient states that she started vomiting this morning and has been unable to Tolerate food or fluids.  Pain in the right lower quadrant has increased and she has had a fever.  She denies concern for STI.  Past Medical History:  Diagnosis Date   Acne    Anxiety    Attention deficit hyperactivity disorder (ADHD)    Chronic upper back pain    lower and upper back pain   Depression    Dyspnea    GERD (gastroesophageal reflux disease)    Gestational hypertension 11/14/2017   Migraine with aura    since elementary school    Patient Active Problem List   Diagnosis Date Noted   Cholecystitis, acute    Stomach ache    Status post cesarean delivery 11/14/2017   Dermoid cyst 10/03/2017   Dermoid cyst of left ovary 10/01/2017   Anxiety 06/24/2016    Past Surgical History:  Procedure Laterality Date   CESAREAN SECTION N/A 11/14/2017   Procedure: CESAREAN SECTION;  Surgeon: Will Bonnet, MD;  Location: [facility] ORS;  Service: Obstetrics;  Laterality: N/A;   ESOPHAGOGASTRODUODENOSCOPY Left 02/17/2019   Procedure: ESOPHAGOGASTRODUODENOSCOPY (EGD);  Surgeon: Virgel Manifold, MD;  Location: [facility] ENDOSCOPY;  Service: Endoscopy;  Laterality: Left;   INSERTION OF NON VAGINAL CONTRACEPTIVE DEVICE     LAPAROSCOPIC OVARIAN CYSTECTOMY Right 02/23/2019   Procedure: LAPAROSCOPIC OVARIAN CYSTECTOMY;  Surgeon: Homero Fellers, MD;  Location: [facility] ORS;  Service: Gynecology;  Laterality: Right;    OOPHORECTOMY Left 10/03/2017   Procedure: OOPHORECTOMY;  Surgeon: Homero Fellers, MD;  Location: [facility] ORS;  Service: Gynecology;  Laterality: Left;   OVARIAN CYST REMOVAL Left 10/03/2017   Procedure: OVARIAN CYSTECTOMY;  Surgeon: Homero Fellers, MD;  Location: [facility] ORS;  Service: Gynecology;  Laterality: Left;    Prior to Admission medications   Medication Sig Start Date End Date Taking? Authorizing Provider  acetaminophen (TYLENOL) 500 MG tablet Take 2 tablets (1,000 mg total) by mouth every 6 (six) hours as needed for mild pain. 01/16/21  Yes Piscoya, Jacqulyn Bath, MD  Multiple Vitamins-Minerals (WOMENS MULTIVITAMIN) TABS Take 1 tablet by mouth daily.   Yes [provider]  promethazine (PHENERGAN) 12.5 MG tablet Take 1 tablet (12.5 mg total) by mouth every 6 (six) hours as needed. 02/24/21  Yes Merlyn Lot, MD  ibuprofen (ADVIL) 600 MG tablet Take 1 tablet (600 mg total) by mouth every 8 (eight) hours as needed for moderate pain. 01/16/21   Olean Ree, MD  oxyCODONE (OXY IR/ROXICODONE) 5 MG immediate release tablet Take 1 tablet (5 mg total) by mouth every 4 (four) hours as needed for severe pain. Patient not taking: Reported on 02/25/2021 01/16/21   Olean Ree, MD    Allergies Buspar [buspirone]  Family History  Problem Relation Age of Onset   Hypertension Mother    Hypothyroidism Mother    Depression Mother    Diabetes Maternal Grandmother     Social History Social History   Tobacco Use   Smoking status: Never   Smokeless  tobacco: Never  Vaping Use   Vaping Use: Every day   Start date: 09/20/2019   Substances: Nicotine, Flavoring  Substance Use Topics   Alcohol use: No    Alcohol/week: 0.0 standard drinks   Drug use: Yes    Frequency: 7.0 times per week    Types: Marijuana    Comment: has stopped for 10 days    Review of Systems Constitutional: Negative for fever. Respiratory: Negative for shortness of breath or cough. Gastrointestinal: Positive  for abdominal pain; positive for nausea , positive for vomiting. Genitourinary: Negative for dysuria , negative for vaginal discharge. Musculoskeletal: Negative for back pain. Skin: Negative for acute skin changes/rash/lesion. ____________________________________________   PHYSICAL EXAM:  VITAL SIGNS: ED Triage Vitals  Enc Vitals Group     BP 02/25/21 1511 (!) 152/98     Pulse Rate 02/25/21 1511 73     Resp 02/25/21 1511 20     Temp 02/25/21 1511 99.3 F (37.4 C)     Temp Source 02/25/21 1511 Oral     SpO2 02/25/21 1511 100 %     Weight 02/25/21 1512 110 lb 0.2 oz (49.9 kg)     Height 02/25/21 1512 '5\' 4"'$  (1.626 m)     Head Circumference --      Peak Flow --      Pain Score 02/25/21 1512 10     Pain Loc --      Pain Edu? --      Excl. in [place]? --     Constitutional: Alert and oriented. Well appearing and in no acute distress. Eyes: Conjunctivae are normal. Head: Atraumatic. Nose: No congestion/rhinnorhea. Mouth/Throat: Mucous membranes are moist. Respiratory: Normal respiratory effort.  No retractions. Gastrointestinal: Bowel sounds active x 4; Abdomen is soft with right lower quadrant pain on light palpation. Genitourinary: Pelvic exam: External exam unremarkable.  Cervical os is closed. Thin yellow discharge noted. Cervical motion tenderness present.  Musculoskeletal: No extremity tenderness nor edema.  Neurologic:  Normal speech and language. No gross focal neurologic deficits are appreciated. Speech is normal. No gait instability. Skin:  Skin is warm, dry and intact. No rash noted on exposed skin. Psychiatric: Mood and affect are normal. Speech and behavior are normal.  ____________________________________________   LABS (all labs ordered are listed, but only abnormal results are displayed)  Labs Reviewed  WET PREP, GENITAL - Abnormal; Notable for the following components:      Result Value   WBC, Wet Prep HPF POC MANY (*)    All other components within normal  limits  CBC - Abnormal; Notable for the following components:   WBC 18.8 (*)    All other components within normal limits  COMPREHENSIVE METABOLIC PANEL - Abnormal; Notable for the following components:   Potassium 3.4 (*)    Glucose, Bld 108 (*)    All other components within normal limits  CHLAMYDIA/NGC RT PCR ([facility] ONLY)            LIPASE, BLOOD   ____________________________________________  RADIOLOGY  Pelvic ultrasound shows a stable appearance of the right ovary in comparison with yesterday's imaging. Color Doppler noted. ____________________________________________  Procedures  ____________________________________________   INITIAL IMPRESSION / ASSESSMENT AND PLAN / ED COURSE  25 year old female presenting to the emergency department for treatment and evaluation of worsening right lower quadrant pain.  See HPI for further details.  Labs drawn while awaiting ER room assignment shows new leukocytosis in comparison to yesterday's results. WBC now 18.8.   ----------------------------------------- 6:38 PM  on 02/25/2021 ----------------------------------------- No new findings on repeat pelvic US in comparison to yesterday.  Patient continues to complain of right lower quadrant pain and nausea but is no longer actively vomiting.  She seems quite sleepy at this time and therefore additional medications will be held.  Yesterday's CT image again reviewed.  Question the significance of the trace.  Hepatic fluid and heterogenous appearance of the liver with periportal edema.   Discussed with Dr. Glennon Mac with [facility] who will come evaluate patient and determine need for exploratory procedure to directly evaluate right ovary.   COVID positive within 90 days--repeat COVID not collected.  Pertinent labs & imaging results that were available during my care of the patient were reviewed by me and considered in my medical decision making (see chart for  details).  ____________________________________________   FINAL CLINICAL IMPRESSION(S) / ED DIAGNOSES  Final diagnoses:  Pelvic pain    Note:  This document was prepared using Dragon voice recognition software and may include unintentional dictation errors.   Victorino Dike, FNP 02/25/21 Rosezetta Schlatter, MD 02/25/21 [phone]

## 2021-02-25 NOTE — Anesthesia Preprocedure Evaluation (Addendum)
Anesthesia Evaluation  Patient identified by MRN, date of birth, ID band Patient awake    Airway Mallampati: II  TM Distance: >3 FB Neck ROM: Full    Dental no notable dental hx.    Pulmonary shortness of breath, asthma ,    Pulmonary exam normal        Cardiovascular Exercise Tolerance: Good hypertension, Normal cardiovascular exam     Neuro/Psych  Headaches, PSYCHIATRIC DISORDERS Anxiety Depression    GI/Hepatic Neg liver ROS, GERD  Medicated and Controlled,  Endo/Other    Renal/GU negative Renal ROS     Musculoskeletal negative musculoskeletal ROS (+)   Abdominal   Peds negative pediatric ROS (+)  Hematology negative hematology ROS (+)   Anesthesia Other Findings Recent Covid--June 2022  Reproductive/Obstetrics                          Anesthesia Physical Anesthesia Plan  ASA: 2 and emergent  Anesthesia Plan: General   Post-op Pain Management:    Induction:   PONV Risk Score and Plan: 4 or greater and Dexamethasone, Aprepitant, Ondansetron, Scopolamine patch - Pre-op and Midazolam  Airway Management Planned: Video Laryngoscope Planned  Additional Equipment:   Intra-op Plan:   Post-operative Plan:   Informed Consent:   Plan Discussed with: CRNA  Anesthesia Plan Comments: (RSI--nausea/vomitting)       Anesthesia Quick Evaluation                                  Anesthesia Evaluation  Patient identified by MRN, date of birth, ID band Patient awake    Reviewed: Allergy & Precautions, NPO status , Patient's Chart, lab work & pertinent test results  History of Anesthesia Complications Negative for: history of anesthetic complications  Airway Mallampati: I  TM Distance: >3 FB Neck ROM: full    Dental  (+) Chipped   Pulmonary neg shortness of breath, asthma ,    Pulmonary exam normal        Cardiovascular Exercise Tolerance: Good hypertension,  (-) angina(-) Past MI and (-) DOE Normal cardiovascular exam     Neuro/Psych  Headaches, PSYCHIATRIC DISORDERS negative psych ROS   GI/Hepatic Neg liver ROS, GERD  Medicated and Controlled,  Endo/Other  negative endocrine ROS  Renal/GU      Musculoskeletal   Abdominal   Peds  Hematology negative hematology ROS (+)   Anesthesia Other Findings Past Medical History: No date: Acne No date: Anxiety No date: Attention deficit hyperactivity disorder (ADHD) No date: Chronic upper back pain     Comment:  lower and upper back pain No date: Depression No date: Dyspnea No date: GERD (gastroesophageal reflux disease) 11/14/2017: Gestational hypertension No date: Migraine with aura     Comment:  since elementary school  Past Surgical History: 11/14/2017: CESAREAN SECTION; N/A     Comment:  Procedure: CESAREAN SECTION;  Surgeon: Will Bonnet, MD;  Location: [facility] ORS;  Service: Obstetrics;                Laterality: N/A; 02/17/2019: ESOPHAGOGASTRODUODENOSCOPY; Left     Comment:  Procedure: ESOPHAGOGASTRODUODENOSCOPY (EGD);  Surgeon:               Virgel Manifold, MD;  Location: [facility] ENDOSCOPY;  Service: Endoscopy;  Laterality: Left; No date: INSERTION OF NON VAGINAL CONTRACEPTIVE DEVICE 02/23/2019: LAPAROSCOPIC OVARIAN CYSTECTOMY; Right     Comment:  Procedure: LAPAROSCOPIC OVARIAN CYSTECTOMY;  Surgeon:               Homero Fellers, MD;  Location: [facility] ORS;  Service:              Gynecology;  Laterality: Right; 10/03/2017: OOPHORECTOMY; Left     Comment:  Procedure: OOPHORECTOMY;  Surgeon: Homero Fellers, MD;  Location: [facility] ORS;  Service: Gynecology;                Laterality: Left; 10/03/2017: OVARIAN CYST REMOVAL; Left     Comment:  Procedure: OVARIAN CYSTECTOMY;  Surgeon: Homero Fellers, MD;  Location: [facility] ORS;  Service:               Gynecology;  Laterality: Left;  BMI    Body  Mass Index: 18.02 kg/m      Reproductive/Obstetrics negative OB ROS                             Anesthesia Physical Anesthesia Plan  ASA: 3  Anesthesia Plan: General ETT   Post-op Pain Management:    Induction: Intravenous  PONV Risk Score and Plan: Ondansetron, Dexamethasone, Midazolam and Treatment may vary due to age or medical condition  Airway Management Planned: Oral ETT  Additional Equipment:   Intra-op Plan:   Post-operative Plan: Extubation in OR  Informed Consent: I have reviewed the patients History and Physical, chart, labs and discussed the procedure including the risks, benefits and alternatives for the proposed anesthesia with the patient or authorized representative who has indicated his/her understanding and acceptance.     Dental Advisory Given  Plan Discussed with: Anesthesiologist, CRNA and Surgeon  Anesthesia Plan Comments: (Patient consented for risks of anesthesia including but not limited to:  - adverse reactions to medications - damage to eyes, teeth, lips or other oral mucosa - nerve damage due to positioning  - sore throat or hoarseness - Damage to heart, brain, nerves, lungs, other parts of body or loss of life  Patient voiced understanding.)        Anesthesia Quick Evaluation                                   Anesthesia Evaluation  Patient identified by MRN, date of birth, ID band Patient awake    Reviewed: Allergy & Precautions, NPO status , Patient's Chart, lab work & pertinent test results  History of Anesthesia Complications Negative for: history of anesthetic complications  Airway Mallampati: I  TM Distance: >3 FB Neck ROM: full    Dental  (+) Chipped   Pulmonary neg shortness of breath, asthma ,    Pulmonary exam normal        Cardiovascular Exercise Tolerance: Good hypertension, (-) angina(-) Past MI and (-) DOE Normal cardiovascular exam     Neuro/Psych  Headaches,  PSYCHIATRIC DISORDERS negative psych ROS   GI/Hepatic Neg liver ROS, GERD  Medicated and Controlled,  Endo/Other  negative endocrine ROS  Renal/GU      Musculoskeletal   Abdominal   Peds  Hematology negative  hematology ROS (+)   Anesthesia Other Findings Past Medical History: No date: Acne No date: Anxiety No date: Attention deficit hyperactivity disorder (ADHD) No date: Chronic upper back pain     Comment:  lower and upper back pain No date: Depression No date: Dyspnea No date: GERD (gastroesophageal reflux disease) 11/14/2017: Gestational hypertension No date: Migraine with aura     Comment:  since elementary school  Past Surgical History: 11/14/2017: CESAREAN SECTION; N/A     Comment:  Procedure: CESAREAN SECTION;  Surgeon: Will Bonnet, MD;  Location: [facility] ORS;  Service: Obstetrics;                Laterality: N/A; 02/17/2019: ESOPHAGOGASTRODUODENOSCOPY; Left     Comment:  Procedure: ESOPHAGOGASTRODUODENOSCOPY (EGD);  Surgeon:               Virgel Manifold, MD;  Location: [facility] ENDOSCOPY;                Service: Endoscopy;  Laterality: Left; No date: INSERTION OF NON VAGINAL CONTRACEPTIVE DEVICE 02/23/2019: LAPAROSCOPIC OVARIAN CYSTECTOMY; Right     Comment:  Procedure: LAPAROSCOPIC OVARIAN CYSTECTOMY;  Surgeon:               Homero Fellers, MD;  Location: [facility] ORS;  Service:              Gynecology;  Laterality: Right; 10/03/2017: OOPHORECTOMY; Left     Comment:  Procedure: OOPHORECTOMY;  Surgeon: Homero Fellers, MD;  Location: [facility] ORS;  Service: Gynecology;                Laterality: Left; 10/03/2017: OVARIAN CYST REMOVAL; Left     Comment:  Procedure: OVARIAN CYSTECTOMY;  Surgeon: Homero Fellers, MD;  Location: [facility] ORS;  Service:               Gynecology;  Laterality: Left;  BMI    Body Mass Index: 18.02 kg/m      Reproductive/Obstetrics negative OB ROS                              Anesthesia Physical Anesthesia Plan  ASA: 3  Anesthesia Plan: General ETT   Post-op Pain Management:    Induction: Intravenous  PONV Risk Score and Plan: Ondansetron, Dexamethasone, Midazolam and Treatment may vary due to age or medical condition  Airway Management Planned: Oral ETT  Additional Equipment:   Intra-op Plan:   Post-operative Plan: Extubation in OR  Informed Consent: I have reviewed the patients History and Physical, chart, labs and discussed the procedure including the risks, benefits and alternatives for the proposed anesthesia with the patient or authorized representative who has indicated his/her understanding and acceptance.     Dental Advisory Given  Plan Discussed with: Anesthesiologist, CRNA and Surgeon  Anesthesia Plan Comments: (Patient consented for risks of anesthesia including but not limited to:  - adverse reactions to medications - damage to eyes, teeth, lips or other oral mucosa - nerve damage due to positioning  - sore throat or hoarseness - Damage to heart, brain, nerves, lungs, other parts of body or loss of life  Patient voiced understanding.)        Anesthesia  Quick Evaluation                                   Anesthesia Evaluation  Patient identified by MRN, date of birth, ID band Patient awake    Reviewed: Allergy & Precautions, NPO status , Patient's Chart, lab work & pertinent test results  History of Anesthesia Complications Negative for: history of anesthetic complications  Airway Mallampati: I  TM Distance: >3 FB Neck ROM: full    Dental  (+) Chipped   Pulmonary neg shortness of breath, asthma ,    Pulmonary exam normal        Cardiovascular Exercise Tolerance: Good hypertension, (-) angina(-) Past MI and (-) DOE Normal cardiovascular exam     Neuro/Psych  Headaches, PSYCHIATRIC DISORDERS negative psych ROS   GI/Hepatic Neg liver ROS, GERD   Medicated and Controlled,  Endo/Other  negative endocrine ROS  Renal/GU      Musculoskeletal   Abdominal   Peds  Hematology negative hematology ROS (+)   Anesthesia Other Findings Past Medical History: No date: Acne No date: Anxiety No date: Attention deficit hyperactivity disorder (ADHD) No date: Chronic upper back pain     Comment:  lower and upper back pain No date: Depression No date: Dyspnea No date: GERD (gastroesophageal reflux disease) 11/14/2017: Gestational hypertension No date: Migraine with aura     Comment:  since elementary school  Past Surgical History: 11/14/2017: CESAREAN SECTION; N/A     Comment:  Procedure: CESAREAN SECTION;  Surgeon: Will Bonnet, MD;  Location: [facility] ORS;  Service: Obstetrics;                Laterality: N/A; 02/17/2019: ESOPHAGOGASTRODUODENOSCOPY; Left     Comment:  Procedure: ESOPHAGOGASTRODUODENOSCOPY (EGD);  Surgeon:               Virgel Manifold, MD;  Location: [facility] ENDOSCOPY;                Service: Endoscopy;  Laterality: Left; No date: INSERTION OF NON VAGINAL CONTRACEPTIVE DEVICE 02/23/2019: LAPAROSCOPIC OVARIAN CYSTECTOMY; Right     Comment:  Procedure: LAPAROSCOPIC OVARIAN CYSTECTOMY;  Surgeon:               Homero Fellers, MD;  Location: [facility] ORS;  Service:              Gynecology;  Laterality: Right; 10/03/2017: OOPHORECTOMY; Left     Comment:  Procedure: OOPHORECTOMY;  Surgeon: Homero Fellers, MD;  Location: [facility] ORS;  Service: Gynecology;                Laterality: Left; 10/03/2017: OVARIAN CYST REMOVAL; Left     Comment:  Procedure: OVARIAN CYSTECTOMY;  Surgeon: Homero Fellers, MD;  Location: [facility] ORS;  Service:               Gynecology;  Laterality: Left;  BMI    Body Mass Index: 18.02 kg/m      Reproductive/Obstetrics negative OB ROS                             Anesthesia Physical Anesthesia Plan  ASA:  3  Anesthesia Plan: General ETT   Post-op Pain Management:    Induction: Intravenous  PONV Risk Score and Plan: Ondansetron, Dexamethasone, Midazolam and Treatment may vary due to age or medical condition  Airway Management Planned: Oral ETT  Additional Equipment:   Intra-op Plan:   Post-operative Plan: Extubation in OR  Informed Consent: I have reviewed the patients History and Physical, chart, labs and discussed the procedure including the risks, benefits and alternatives for the proposed anesthesia with the patient or authorized representative who has indicated his/her understanding and acceptance.     Dental Advisory Given  Plan Discussed with: Anesthesiologist, CRNA and Surgeon  Anesthesia Plan Comments: (Patient consented for risks of anesthesia including but not limited to:  - adverse reactions to medications - damage to eyes, teeth, lips or other oral mucosa - nerve damage due to positioning  - sore throat or hoarseness - Damage to heart, brain, nerves, lungs, other parts of body or loss of life  Patient voiced understanding.)        Anesthesia Quick Evaluation

## 2021-02-25 NOTE — Anesthesia Procedure Notes (Signed)
Procedure Name: Intubation Date/Time: 02/25/2021 10:02 PM Performed by: Clinton Sawyer, CRNA Pre-anesthesia Checklist: Patient identified, Patient being monitored, Timeout performed, Emergency Drugs available and Suction available Patient Re-evaluated:Patient Re-evaluated prior to induction Oxygen Delivery Method: Circle system utilized Preoxygenation: Pre-oxygenation with 100% oxygen Induction Type: IV induction, Rapid sequence and Cricoid Pressure applied Laryngoscope Size: Mac and 4 Grade View: Grade I Tube type: Oral Tube size: 7.0 mm Number of attempts: 1 Airway Equipment and Method: Stylet Placement Confirmation: ETT inserted through vocal cords under direct vision, positive ETCO2 and breath sounds checked- equal and bilateral Secured at: 21 cm Tube secured with: Tape Dental Injury: Teeth and Oropharynx as per pre-operative assessment

## 2021-02-25 NOTE — ED Notes (Signed)
Pt presents to the ED for generalized abd pain that started this morning. Pt also c/o NV but denies diarrhea. Pt also denies urinary symptoms. Denies fever. Pt states that she has a hx of ovarian cyst and one needed to be removed before. Pt also states that she had a cholecystectomy 1 month ago. Pt is A&Ox4 and NAD.

## 2021-02-25 NOTE — H&P (Addendum)
Preoperative History and Physical  Tiffany Guzman is a 25 y.o. G1P1001 here for surgical management of right lower quadrant abdominal pain, right ovarian cyst, history of left oophorectomy.   No significant preoperative concerns.  History of Present Illness: 25 y.o. G53P1001 female who presented to the ER yesterday with acute-onset right lower quadrant abdominal pain. She had a CT scan that showed periportal edema, small amount of fluid along the inferior right hepatic lobe. The appendix is poorly characterized. No clear evidence of appendix inflammation. Low-density structure in right hemipelvis measuring 5.9 x 4.78 x 6.0 cm with a small amount of fluid along the right side of the pelvis.  A pelvic ultrasound yesterday showed an enlarged right ovary with a cyst has a thin septation. The cyst measured 5.3 x 4.0 x 5.0 cm.  She was discharged yesterday to follow up with gynecology. She returned today with severe pain, 10 out of 10, that does not radiate.  The pain is described as a squeezing, pressure sensation.  She notes that the pain is in her right lower quadrant. She notes no alleviating or palliating factors. Today she has had nausea and emesis with no other associated symptoms. Of note, she had a left oophorectomy during her pregnancy a few years ago. She also had a cholecystectomy about 1.5 months ago.    Proposed surgery: diagnostic laparoscopy with possible right ovarian cystetctomy.    Past Medical History:  Diagnosis Date   Acne    Anxiety    Attention deficit hyperactivity disorder (ADHD)    Chronic upper back pain    lower and upper back pain   Depression    Dyspnea    GERD (gastroesophageal reflux disease)    Gestational hypertension 11/14/2017   Migraine with aura    since elementary school   Past Surgical History:  Procedure Laterality Date   CESAREAN SECTION N/A 11/14/2017   Procedure: CESAREAN SECTION;  Surgeon: Will Bonnet, MD;  Location: [facility] ORS;  Service:  Obstetrics;  Laterality: N/A;   ESOPHAGOGASTRODUODENOSCOPY Left 02/17/2019   Procedure: ESOPHAGOGASTRODUODENOSCOPY (EGD);  Surgeon: Virgel Manifold, MD;  Location: [facility] ENDOSCOPY;  Service: Endoscopy;  Laterality: Left;   INSERTION OF NON VAGINAL CONTRACEPTIVE DEVICE     LAPAROSCOPIC OVARIAN CYSTECTOMY Right 02/23/2019   Procedure: LAPAROSCOPIC OVARIAN CYSTECTOMY;  Surgeon: Homero Fellers, MD;  Location: [facility] ORS;  Service: Gynecology;  Laterality: Right;   OOPHORECTOMY Left 10/03/2017   Procedure: OOPHORECTOMY;  Surgeon: Homero Fellers, MD;  Location: [facility] ORS;  Service: Gynecology;  Laterality: Left;   OVARIAN CYST REMOVAL Left 10/03/2017   Procedure: OVARIAN CYSTECTOMY;  Surgeon: Homero Fellers, MD;  Location: [facility] ORS;  Service: Gynecology;  Laterality: Left;   OB History  Gravida Para Term Preterm AB Living  '1 1 1     1  '$ SAB IAB Ectopic Multiple Live Births        0 1    # Outcome Date GA Lbr Len/2nd Weight Sex Delivery Anes PTL Lv  1 Term 11/14/17 [redacted]w[redacted]d 2620 g M CS-LTranv Spinal  LIV  Patient denies any other pertinent gynecologic issues.   No current facility-administered medications on file prior to encounter.   Current Outpatient Medications on File Prior to Encounter  Medication Sig Dispense Refill   acetaminophen (TYLENOL) 500 MG tablet Take 2 tablets (1,000 mg total) by mouth every 6 (six) hours as needed for mild pain.     Multiple Vitamins-Minerals (WOMENS MULTIVITAMIN) TABS Take 1 tablet by mouth  daily.     promethazine (PHENERGAN) 12.5 MG tablet Take 1 tablet (12.5 mg total) by mouth every 6 (six) hours as needed. 12 tablet 0   ibuprofen (ADVIL) 600 MG tablet Take 1 tablet (600 mg total) by mouth every 8 (eight) hours as needed for moderate pain. 60 tablet 1   Allergies  Allergen Reactions   Buspar [Buspirone]     Dystonic reaction - arms, legs, jaw "locked up"    Social History:   reports that she has never smoked. She has never used  smokeless tobacco. She reports current drug use. Frequency: 7.00 times per week. Drug: Marijuana. She reports that she does not drink alcohol.  Family History  Problem Relation Age of Onset   Hypertension Mother    Hypothyroidism Mother    Depression Mother    Diabetes Maternal Grandmother     Review of Systems: Noncontributory  PHYSICAL EXAM: Blood pressure (!) 148/90, pulse 67, temperature 99.3 F (37.4 C), temperature source Oral, resp. rate 16, height '5\' 4"'$  (1.626 m), weight 49.9 kg, last menstrual period 02/17/2021, SpO2 98 %. Physical Exam Constitutional:      General: She is in acute distress (mild).     Appearance: Normal appearance. She is well-developed. She is ill-appearing.  Genitourinary:     Genitourinary Comments: deferred  HENT:     Head: Normocephalic and atraumatic.  Eyes:     General: No scleral icterus.    Conjunctiva/sclera: Conjunctivae normal.  Cardiovascular:     Rate and Rhythm: Normal rate and regular rhythm.     Heart sounds: No murmur heard.   No friction rub. No gallop.  Pulmonary:     Effort: Pulmonary effort is normal. No respiratory distress.     Breath sounds: Normal breath sounds. No wheezing or rales.  Abdominal:     General: Bowel sounds are normal. There is no distension.     Palpations: Abdomen is soft. There is no mass.     Tenderness: There is abdominal tenderness (RLQ, suprapubic area). There is guarding (in areas of tenderness). There is no right CVA tenderness, left CVA tenderness or rebound.     Comments: Surgical incision sites without erythema, induration, warmth, and tenderness  Musculoskeletal:        General: Normal range of motion.     Cervical back: Normal range of motion and neck supple.  Neurological:     General: No focal deficit present.     Mental Status: She is alert and oriented to person, place, and time.     Cranial Nerves: No cranial nerve deficit.  Skin:    General: Skin is warm and dry.     Findings: No  erythema.  Psychiatric:        Mood and Affect: Mood normal.        Behavior: Behavior normal.        Judgment: Judgment normal.     Labs: Results for orders placed or performed during the hospital encounter of 02/25/21 (from the past 336 hour(s))  CBC   Collection Time: 02/25/21  3:36 PM  Result Value Ref Range   WBC 18.8 (H) 4.0 - 10.5 K/uL   RBC 4.37 3.87 - 5.11 MIL/uL   Hemoglobin 12.8 12.0 - 15.0 g/dL   HCT 38.0 36.0 - 46.0 %   MCV 87.0 80.0 - 100.0 fL   MCH 29.3 26.0 - 34.0 pg   MCHC 33.7 30.0 - 36.0 g/dL   RDW 14.1 11.5 - 15.5 %  Platelets 313 150 - 400 K/uL   nRBC 0.0 0.0 - 0.2 %  Comprehensive metabolic panel   Collection Time: 02/25/21  3:36 PM  Result Value Ref Range   Sodium 138 135 - 145 mmol/L   Potassium 3.4 (L) 3.5 - 5.1 mmol/L   Chloride 105 98 - 111 mmol/L   CO2 23 22 - 32 mmol/L   Glucose, Bld 108 (H) 70 - 99 mg/dL   BUN 13 6 - 20 mg/dL   Creatinine, Ser 0.71 0.44 - 1.00 mg/dL   Calcium 9.1 8.9 - 10.3 mg/dL   Total Protein 7.8 6.5 - 8.1 g/dL   Albumin 4.9 3.5 - 5.0 g/dL   AST 19 15 - 41 U/L   ALT 22 0 - 44 U/L   Alkaline Phosphatase 63 38 - 126 U/L   Total Bilirubin 1.1 0.3 - 1.2 mg/dL   GFR, Estimated >60 >60 mL/min   Anion gap 10 5 - 15  Lipase, blood   Collection Time: 02/25/21  3:37 PM  Result Value Ref Range   Lipase 32 11 - 51 U/L  Chlamydia/NGC rt PCR ([facility] only)   Collection Time: 02/25/21  4:58 PM   Specimen: Cervical/Vaginal swab  Result Value Ref Range   Specimen source GC/Chlam ENDOCERVICAL    Chlamydia Tr NOT DETECTED NOT DETECTED   N gonorrhoeae NOT DETECTED NOT DETECTED  Wet prep, genital   Collection Time: 02/25/21  4:58 PM   Specimen: Cervical/Vaginal swab  Result Value Ref Range   Yeast Wet Prep HPF POC NONE SEEN NONE SEEN   Trich, Wet Prep NONE SEEN NONE SEEN   Clue Cells Wet Prep HPF POC NONE SEEN NONE SEEN   WBC, Wet Prep HPF POC MANY (A) NONE SEEN   Sperm NONE SEEN   Results for orders placed or performed during  the hospital encounter of 02/24/21 (from the past 336 hour(s))  Lipase, blood   Collection Time: 02/24/21  9:43 AM  Result Value Ref Range   Lipase 31 11 - 51 U/L  Comprehensive metabolic panel   Collection Time: 02/24/21  9:43 AM  Result Value Ref Range   Sodium 140 135 - 145 mmol/L   Potassium 3.9 3.5 - 5.1 mmol/L   Chloride 105 98 - 111 mmol/L   CO2 22 22 - 32 mmol/L   Glucose, Bld 98 70 - 99 mg/dL   BUN 11 6 - 20 mg/dL   Creatinine, Ser 0.77 0.44 - 1.00 mg/dL   Calcium 9.3 8.9 - 10.3 mg/dL   Total Protein 8.3 (H) 6.5 - 8.1 g/dL   Albumin 4.9 3.5 - 5.0 g/dL   AST 20 15 - 41 U/L   ALT 20 0 - 44 U/L   Alkaline Phosphatase 66 38 - 126 U/L   Total Bilirubin 1.2 0.3 - 1.2 mg/dL   GFR, Estimated >60 >60 mL/min   Anion gap 13 5 - 15  CBC   Collection Time: 02/24/21  9:43 AM  Result Value Ref Range   WBC 10.4 4.0 - 10.5 K/uL   RBC 4.72 3.87 - 5.11 MIL/uL   Hemoglobin 13.6 12.0 - 15.0 g/dL   HCT 40.5 36.0 - 46.0 %   MCV 85.8 80.0 - 100.0 fL   MCH 28.8 26.0 - 34.0 pg   MCHC 33.6 30.0 - 36.0 g/dL   RDW 13.6 11.5 - 15.5 %   Platelets 312 150 - 400 K/uL   nRBC 0.0 0.0 - 0.2 %  hCG, quantitative, pregnancy  Collection Time: 02/24/21  9:43 AM  Result Value Ref Range   hCG, Beta Chain, Quant, S <1 <5 mIU/mL  Urinalysis, Complete w Microscopic   Collection Time: 02/24/21  4:03 PM  Result Value Ref Range   Color, Urine STRAW (A) YELLOW   APPearance CLEAR (A) CLEAR   Specific Gravity, Urine >1.046 (H) 1.005 - 1.030   pH 5.0 5.0 - 8.0   Glucose, UA NEGATIVE NEGATIVE mg/dL   Hgb urine dipstick LARGE (A) NEGATIVE   Bilirubin Urine NEGATIVE NEGATIVE   Ketones, ur 80 (A) NEGATIVE mg/dL   Protein, ur NEGATIVE NEGATIVE mg/dL   Nitrite NEGATIVE NEGATIVE   Leukocytes,Ua NEGATIVE NEGATIVE   RBC / HPF 0-5 0 - 5 RBC/hpf   WBC, UA 0-5 0 - 5 WBC/hpf   Bacteria, UA RARE (A) NONE SEEN   Squamous Epithelial / LPF 6-10 0 - 5   Mucus PRESENT   Results for orders placed or performed during  the hospital encounter of 02/14/21 (from the past 336 hour(s))  Lipase, blood   Collection Time: 02/14/21 11:36 AM  Result Value Ref Range   Lipase 33 11 - 51 U/L  Comprehensive metabolic panel   Collection Time: 02/14/21 11:36 AM  Result Value Ref Range   Sodium 139 135 - 145 mmol/L   Potassium 3.4 (L) 3.5 - 5.1 mmol/L   Chloride 105 98 - 111 mmol/L   CO2 21 (L) 22 - 32 mmol/L   Glucose, Bld 114 (H) 70 - 99 mg/dL   BUN 12 6 - 20 mg/dL   Creatinine, Ser 0.77 0.44 - 1.00 mg/dL   Calcium 9.3 8.9 - 10.3 mg/dL   Total Protein 7.7 6.5 - 8.1 g/dL   Albumin 4.7 3.5 - 5.0 g/dL   AST 21 15 - 41 U/L   ALT 14 0 - 44 U/L   Alkaline Phosphatase 69 38 - 126 U/L   Total Bilirubin 0.8 0.3 - 1.2 mg/dL   GFR, Estimated >60 >60 mL/min   Anion gap 13 5 - 15  CBC   Collection Time: 02/14/21 11:36 AM  Result Value Ref Range   WBC 15.2 (H) 4.0 - 10.5 K/uL   RBC 4.52 3.87 - 5.11 MIL/uL   Hemoglobin 13.1 12.0 - 15.0 g/dL   HCT 38.7 36.0 - 46.0 %   MCV 85.6 80.0 - 100.0 fL   MCH 29.0 26.0 - 34.0 pg   MCHC 33.9 30.0 - 36.0 g/dL   RDW 13.3 11.5 - 15.5 %   Platelets 312 150 - 400 K/uL   nRBC 0.0 0.0 - 0.2 %  Urinalysis, Complete w Microscopic Urine, Clean Catch   Collection Time: 02/14/21  4:59 PM  Result Value Ref Range   Color, Urine YELLOW (A) YELLOW   APPearance CLOUDY (A) CLEAR   Specific Gravity, Urine 1.028 1.005 - 1.030   pH 5.0 5.0 - 8.0   Glucose, UA NEGATIVE NEGATIVE mg/dL   Hgb urine dipstick NEGATIVE NEGATIVE   Bilirubin Urine NEGATIVE NEGATIVE   Ketones, ur 80 (A) NEGATIVE mg/dL   Protein, ur 30 (A) NEGATIVE mg/dL   Nitrite NEGATIVE NEGATIVE   Leukocytes,Ua SMALL (A) NEGATIVE   RBC / HPF 0-5 0 - 5 RBC/hpf   WBC, UA 0-5 0 - 5 WBC/hpf   Bacteria, UA NONE SEEN NONE SEEN   Squamous Epithelial / LPF 21-50 0 - 5   Mucus PRESENT   POC urine preg, ED   Collection Time: 02/14/21  5:00 PM  Result Value  Ref Range   Preg Test, Ur NEGATIVE NEGATIVE    Imaging Studies: CT ABDOMEN  PELVIS W CONTRAST  Result Date: 02/24/2021 CLINICAL DATA:  Abdominal pain, acute, nonlocalized. Recent cholecystectomy. EXAM: CT ABDOMEN AND PELVIS WITH CONTRAST TECHNIQUE: Multidetector CT imaging of the abdomen and pelvis was performed using the standard protocol following bolus administration of intravenous contrast. CONTRAST:  68m OMNIPAQUE IOHEXOL 350 MG/ML SOLN COMPARISON:  02/13/2019 FINDINGS: Lower chest: Lung bases are clear. Hepatobiliary: Periportal edema noted in the liver. Prominent focal fat near the falciform ligament. Gallbladder has been removed. Small amount of fluid along the inferior right hepatic lobe. Liver is heterogeneous but this may be associated with the phase of contrast. There may be focal fat near the near the gallbladder fossa. Common bile duct measures 4 mm. Pancreas: Diffuse low-density without inflammatory changes or duct dilatation. Spleen: Normal in size without focal abnormality. Adrenals/Urinary Tract: Normal adrenal glands. Normal appearance of both kidneys without hydronephrosis. Normal appearance of the urinary bladder. Stomach/Bowel: Moderate amount of stool in the rectum. There may be a normal appendix in the right pelvis on sequence 2, image 54 and sequence 5 image 32. However, the appendix is poorly characterized on this examination. No clear evidence for appendix inflammation. No evidence for small bowel dilatation. Normal appearance of the stomach. Vascular/Lymphatic: Normal appearance of the arterial structures. Main visceral arteries are patent. There is contrast within the iliac veins and IVC. No significant contrast within the hepatic veins. No abdominal or pelvic lymph node enlargement. Reproductive: Large low-density structure in the right hemipelvis measures 5.9 x 4.7 x 6.0 cm. Small amount of fluid along the right side of the pelvis. Cannot exclude a tubular fluid-filled structure in the right adnexa. Normal appearance of the uterus. No gross abnormality to  the left adnexa. Other: Patient has vaginal tampon. Trace fluid along the right side of the pelvis but no significant pelvic free fluid. There is trace fluid along the inferior right hepatic lobe on sequence 2 image 37. Negative for free air. Postsurgical changes in the periumbilical region along the anterior abdomen. Musculoskeletal: No acute bone abnormality. IMPRESSION: 1. 6.0 cm low-density structure in the right hemipelvis. This is suggestive for a right ovarian or adnexal cystic structure. Cannot exclude right hydrosalpinx. Recommend clinical correlation with regards to tubo-ovarian abscess. Recommend further characterization with a pelvic ultrasound. 2. Trace perihepatic fluid along the inferior right hepatic lobe. This may be associated with the right pelvic cystic structure but nonspecific. 3. Liver has a very heterogeneous appearance with periportal edema. Periportal edema could be secondary to recent cholecystectomy. Heterogeneity in liver probably represents a combination of the contrast phase and areas of geographic hepatic steatosis. In addition, pancreas diffusely hypodense but no inflammatory changes. Again, this may be related to the phase of contrast rather than pancreatic inflammation. Electronically Signed   By: AMarkus DaftM.D.   On: 02/24/2021 16:10   [place]PELV[place] COMPLETE WITH TRANSVAGINAL  Result Date: 02/25/2021 CLINICAL DATA:  Pelvic pain for 3-4 days. Previous LEFT oophorectomy. LMP 02/20/2021. EXAM: TRANSABDOMINAL AND TRANSVAGINAL ULTRASOUND OF PELVIS TECHNIQUE: Both transabdominal and transvaginal ultrasound examinations of the pelvis were performed. Transabdominal technique was performed for global imaging of the pelvis including uterus, ovaries, adnexal regions, and pelvic cul-de-sac. It was necessary to proceed with endovaginal exam following the transabdominal exam to visualize the uterus and endometrium, ovaries, adnexal regions. COMPARISON:  Pelvic ultrasound on 02/24/2021, CT of  the abdomen and pelvis on 02/24/2021 FINDINGS: Uterus Measurements: 7.7 x 3.4  x 5.2 centimeters = volume: 70.8 mL. No fibroids or other mass visualized. Endometrium Thickness: 2.4 millimeters.  No focal abnormality visualized. Right ovary Measurements: 6.0 x 4.4 x 5.7 centimeters = volume: 79.02 mL. Cystic area in the RIGHT ovary is 5.8 x 3.8 x 5.3 centimeters. A single internal septation is present and stable in appearance. Left ovary Measurements: Surgically absent Other findings Trace free pelvic fluid surrounding the RIGHT ovary. Color Doppler noted in the RIGHT ovarian stroma. IMPRESSION: 1. Stable appearance of cyst within the RIGHT ovary. Recommend follow-up US in 3-6 months. Note: This recommendation does not apply to premenarchal patients or to those with increased risk (genetic, family history, elevated tumor markers or other high-risk factors) of ovarian cancer. Reference: Radiology 2019 Nov; 293(2):359-371. 2. No evidence for interval rupture or torsion. 3. Surgically absent LEFT ovary. 4. Normal appearance of the uterus. Electronically Signed   By: Nolon Nations M.D.   On: 02/25/2021 18:08   US PELVIC COMPLETE W TRANSVAGINAL AND TORSION R/O  Result Date: 02/24/2021 CLINICAL DATA:  Pelvic pain for 2-3 days EXAM: TRANSABDOMINAL AND TRANSVAGINAL ULTRASOUND OF PELVIS DOPPLER ULTRASOUND OF OVARIES TECHNIQUE: Both transabdominal and transvaginal ultrasound examinations of the pelvis were performed. Transabdominal technique was performed for global imaging of the pelvis including uterus, ovaries, adnexal regions, and pelvic cul-de-sac. It was necessary to proceed with endovaginal exam following the transabdominal exam to visualize the uterus, endometrium, ovaries, and adnexa. Color and duplex Doppler ultrasound was utilized to evaluate blood flow to the ovaries. COMPARISON:  None. FINDINGS: Uterus Measurements: 8.1 x 3.8 x 4.6 cm = volume: 74 mL. No fibroids or other mass visualized. Endometrium  Thickness: 3 mm.  No focal abnormality visualized. Right ovary Measurements: 6.6 x 4.3 x 5.5 cm = volume: 81 mL. The right ovary is enlarged by a thinly septated cyst measuring 5.3 x 4.0 x 5.0 cm. Left ovary Surgically absent. Pulsed Doppler evaluation of the right ovary demonstrates normal low-resistance arterial and venous waveforms. Other findings No abnormal free fluid. IMPRESSION: 1. The right ovary is enlarged by a thinly septated cyst measuring 5.3 x 4.0 x 5.0 cm, almost certainly benign and functional in the reproductive age setting. Recommend follow-up US in 3-6 months to ensure stability or resolution. Note: This recommendation does not apply to premenarchal patients or to those with increased risk (genetic, family history, elevated tumor markers or other high-risk factors) of ovarian cancer. Reference: Radiology 2019 Nov; 293(2):359-371. 2. There is normal arterial and venous Doppler flow to the right ovary. Please note, however that intermittent or incomplete ovarian torsion cannot be strictly excluded by ultrasound in any enlarged ovary. 3. The left ovary is surgically absent. 4. Unremarkable uterus. Electronically Signed   By: Eddie Candle M.D.   On: 02/24/2021 19:18    Assessment: Patient Active Problem List   Diagnosis Date Noted   Right lower quadrant abdominal pain 02/25/2021   Right ovarian cyst 02/25/2021   Leukocytosis 02/25/2021    Plan: Patient will undergo surgical management with the above noted surgery emergently.  I am concerned about her risk of losing her right ovary with no left ovary remaining.  Her symptoms could very well be consistent with right ovarian torsion.  I also spoke with the general surgeon, Dr. Windell Moment who will be available to assess the appendix and any other structures of concern.    Treatment for PID has been initiated, given her symptoms.  Will assess as much as possible for PID.     The  risks of surgery were discussed in detail with the patient  including but not limited to: bleeding which may require transfusion or reoperation; infection which may require antibiotics; injury to surrounding organs which may involve bowel, bladder, ureters ; need for additional procedures including laparoscopy or laparotomy; thromboembolic phenomenon, surgical site problems and other postoperative/anesthesia complications. Likelihood of success in alleviating the patient's condition was discussed. Routine postoperative instructions will be reviewed with the patient and her family in detail after surgery.  The patient concurred with the proposed plan, giving informed written consent for the surgery.  Patient has been NPO since last night she will remain NPO for procedure.  Anesthesia and OR aware.  Preoperative prophylactic antibiotics, as indicated, and SCDs ordered on call to the OR.  To OR when ready.  A total of 27 minutes were spent face-to-face with the patient as well as preparation, review, communication, and documentation during this encounter.    Prentice Docker, MD 02/25/2021 8:43 PM

## 2021-02-25 NOTE — ED Triage Notes (Signed)
Pt arrives to ER via [facility] for R ovarian cyst and dry heaves. Pt putting finger in throat in lobby/triage to force herself to throw up. Pt informed not to do that. Pt has emesis bag. Only spit noted at this time. Was seen yesterday for same symptoms. Pt states she took "phenergan earlier but it didn't help."

## 2021-02-25 NOTE — Op Note (Signed)
Operative Note    Name: Tiffany Guzman  Date of Service: 02/25/2021  DOB: 1996/01/24  MRN: [id number]   Pre-Operative Diagnosis:  Right lower quadrant abdominal pain [R10.31] Right ovarian cyst [N83.201]  Post-Operative Diagnosis:  Right lower quadrant abdominal pain [R10.31] Right ovarian cyst [N83.201]  Procedures:  1. Operative laparoscopy 2. Right ovarian cystectomy  Primary Surgeon: Prentice Docker, MD   EBL: 20 mL   IVF: 1,400 mL   Urine output: 150 mL  Specimens: right ovarian cyst wall  Drains: none  Complications: None   Disposition: PACU   Condition: Stable   Findings:  Normal appearing uterus and bilateral fallopian tubes Absent left ovary Enlarged right ovary with a simple cystic structure with tea colored fluid Enlarged liver Normal appearing appendix   Procedure Summary:  The patient was taken to the operating room where general anesthesia was administered and found to be adequate. She was placed in the dorsal supine lithotomy position in [place] stirrups and prepped and draped in usual sterile fashion. After a timeout was called an indwelling catheter was placed in her bladder. A sterile speculum was placed in the vagina and a single-tooth tenaculum was used to grasp the anterior lip of the cervix. An acorn uterine manipulator was affixed to the tenaculum. The speculum was removed from the vagina.  Attention was turned to the abdomen where after injection of local anesthetic, a 5 mm infraumbilical incision was made with the scalpel. Entry into the abdomen was obtained via Optiview trocar technique (a blunt entry technique with camera visualization through the obturator upon entry). Verification of entry into the abdomen was obtained using opening pressures. The abdomen was insufflated with CO2. The camera was introduced through the trocar with verification of atraumatic entry.  Using previous incisions, a left lower quadrant 5 mm port site and a  suprapubic 5 mm port site was created under direct intra-abdominal camera visualization without difficulty. A survey of the abdomen and pelvis with undertaken with the above-noted findings.    Attention was turned to the right ovary.  The ovarian capsule overlying the cyst wall was scored using monopolar scissors and the ovarian capsule was gently opened. This opening was extended in parallel to the blood supply to the ovary. The ovarian cyst was shelled out of the ovary and at some point the cyst ruptured. The rest of the cyst wall was removed from the ovary and then from the body cavity in one piece.  There was slight oozing at the base of the cyst wall and fibrillar was placed in this area to obtain hemostasis, which was achieved.  After monitoring for hemostasis for five minutes and being satisfied that hemostasis was indeed achieved, the surgery was terminated.    All instruments were removed and the abdomen was emptied of CO2. All trocars were removed without difficulty.  The infra-umbilical incision was closed at the subcutaneous level, where granulation tissue was present from her prior cholecystectomy.  The skin was closed using 4-0 monocryl.  The remaining skin sites were closed using 4-0 monocryl  All skin sites were reinforced using surgical skin glue.    Attention was returned to the pelvis where the acorn uterine manipulator was removed and the single tooth tenaculum was removed.  There was bleeding at the entry points of the single tooth tenaculum which were made hemostatic using silver nitrate.  The foley catheter was removed.    The patient tolerated the procedure well.  Sponge, lap, needle, and instrument counts were correct  x 2.  VTE prophylaxis: SCDs. Antibiotic prophylaxis: previously given in the ER (ceftriaxone, doxycycline, metronidazole). She was awakened in the operating room and was taken to the PACU in stable condition.   Prentice Docker, MD 02/25/2021 11:28 PM

## 2021-02-25 NOTE — ED Notes (Signed)
Pt transported to US

## 2021-02-25 NOTE — ED Triage Notes (Signed)
Pt in via EMS from home. Pt was seen here yesterday and dx'd with ovarian cyst. Pt had one ovary removed already, the left. Pt took '1500mg'$  of tylenol at 12 and '4mg'$  of zofran at 1445. Pt with temperature and 10/10 pain. Pt was advised to come back if the pain did not ease off.

## 2021-02-26 ENCOUNTER — Encounter: Payer: Self-pay | Admitting: Obstetrics and Gynecology

## 2021-02-26 ENCOUNTER — Telehealth: Payer: Self-pay | Admitting: *Deleted

## 2021-02-26 DIAGNOSIS — R001 Bradycardia, unspecified: Secondary | ICD-10-CM | POA: Diagnosis not present

## 2021-02-26 LAB — CBC
HCT: 36.3 % (ref 36.0–46.0)
Hemoglobin: 12.1 g/dL (ref 12.0–15.0)
MCH: 29.7 pg (ref 26.0–34.0)
MCHC: 33.3 g/dL (ref 30.0–36.0)
MCV: 89 fL (ref 80.0–100.0)
Platelets: 257 10*3/uL (ref 150–400)
RBC: 4.08 MIL/uL (ref 3.87–5.11)
RDW: 14.3 % (ref 11.5–15.5)
WBC: 13.9 10*3/uL — ABNORMAL HIGH (ref 4.0–10.5)
nRBC: 0 % (ref 0.0–0.2)

## 2021-02-26 LAB — BASIC METABOLIC PANEL
Anion gap: 5 (ref 5–15)
BUN: 12 mg/dL (ref 6–20)
CO2: 27 mmol/L (ref 22–32)
Calcium: 8.7 mg/dL — ABNORMAL LOW (ref 8.9–10.3)
Chloride: 107 mmol/L (ref 98–111)
Creatinine, Ser: 0.65 mg/dL (ref 0.44–1.00)
GFR, Estimated: >60 mL/min (ref >60)
Glucose, Bld: 122 mg/dL — ABNORMAL HIGH (ref 70–99)
Potassium: 4.4 mmol/L (ref 3.5–5.1)
Sodium: 139 mmol/L (ref 135–145)

## 2021-02-26 LAB — TSH: TSH: 0.476 u[IU]/mL (ref 0.350–4.500)

## 2021-02-26 MED ORDER — IBUPROFEN 600 MG PO TABS
600.0000 mg | ORAL_TABLET | Freq: Four times a day (QID) | ORAL | Status: DC
  Administered 2021-02-26 (×2): 600 mg via ORAL
  Filled 2021-02-26 (×2): qty 1

## 2021-02-26 MED ORDER — MENTHOL 3 MG MT LOZG
1.0000 | LOZENGE | OROMUCOSAL | Status: DC | PRN
  Filled 2021-02-26: qty 9

## 2021-02-26 MED ORDER — SIMETHICONE 80 MG PO CHEW: 80.0000 mg | CHEWABLE_TABLET | Freq: Four times a day (QID) | ORAL | Status: DC | PRN

## 2021-02-26 MED ORDER — OXYCODONE-ACETAMINOPHEN 5-325 MG PO TABS
2.0000 | ORAL_TABLET | ORAL | Status: DC | PRN
  Administered 2021-02-26: 2 via ORAL
  Filled 2021-02-26: qty 2

## 2021-02-26 MED ORDER — ONDANSETRON HCL 4 MG/2ML IJ SOLN: 4.0000 mg | Freq: Four times a day (QID) | INTRAMUSCULAR | Status: DC | PRN

## 2021-02-26 MED ORDER — OXYCODONE-ACETAMINOPHEN 5-325 MG PO TABS: 1.0000 | ORAL_TABLET | Freq: Four times a day (QID) | ORAL | 0 refills | Status: DC | PRN

## 2021-02-26 MED ORDER — LACTATED RINGERS IV SOLN: INTRAVENOUS | Status: DC

## 2021-02-26 MED ORDER — METRONIDAZOLE 500 MG PO TABS: 500.0000 mg | ORAL_TABLET | Freq: Two times a day (BID) | ORAL | 0 refills | Status: DC

## 2021-02-26 MED ORDER — IBUPROFEN 600 MG PO TABS: 600.0000 mg | ORAL_TABLET | Freq: Four times a day (QID) | ORAL | 0 refills | Status: DC

## 2021-02-26 MED ORDER — OXYCODONE-ACETAMINOPHEN 5-325 MG PO TABS
1.0000 | ORAL_TABLET | ORAL | Status: DC | PRN
Start: 2021-02-26 — End: 2021-02-26

## 2021-02-26 MED ORDER — DOCUSATE SODIUM 100 MG PO CAPS
100.0000 mg | ORAL_CAPSULE | Freq: Two times a day (BID) | ORAL | Status: DC
  Administered 2021-02-26: 100 mg via ORAL
  Filled 2021-02-26: qty 1

## 2021-02-26 MED ORDER — ONDANSETRON HCL 4 MG PO TABS: 4.0000 mg | ORAL_TABLET | Freq: Four times a day (QID) | ORAL | Status: DC | PRN

## 2021-02-26 MED ORDER — METRONIDAZOLE 500 MG PO TABS
500.0000 mg | ORAL_TABLET | Freq: Two times a day (BID) | ORAL | Status: DC
  Administered 2021-02-26: 500 mg via ORAL
  Filled 2021-02-26 (×2): qty 1

## 2021-02-26 MED ORDER — SODIUM CHLORIDE 0.9 % IV SOLN
100.0000 mg | Freq: Two times a day (BID) | INTRAVENOUS | Status: DC
  Administered 2021-02-26: 100 mg via INTRAVENOUS
  Filled 2021-02-26 (×2): qty 100

## 2021-02-26 MED ORDER — DOXYCYCLINE HYCLATE 100 MG PO CAPS: 100.0000 mg | ORAL_CAPSULE | Freq: Two times a day (BID) | ORAL | 0 refills | Status: DC

## 2021-02-26 NOTE — Consult Note (Addendum)
[facility] Medical Consultation  Tiffany Guzman [id number] DOB: 06-02-1996 DOA: 02/25/2021 PCP: [facility]   Requesting physician: Dr Glennon Mac Date of consultation: 02/26/21 Reason for consultation: Bradycardia  Impression/Recommendations Principal Problem:   Bradycardia Active Problems:   Right lower quadrant abdominal pain   Right ovarian cyst   Leukocytosis   Status post ovarian cystectomy    Bradycardia Asymptomatic Patient noted to have heart rate in the 40s at rest but denies feeling dizzy or lightheaded and has not syncopal episodes.  She is very active but is not an athlete and has never been told that she has bradycardia. She denies any excessive weight loss or weight gain and denies having increased sleepiness, cold intolerance, mood changes or lower extremity swelling. She only takes a multivitamin and is not on any prescription medications. Patient may be discharged home to follow up with cardiology as an outpatient.   2.  Right lower quadrant pain Status post right ovarian cystectomy Further management per gynecology  I will followup again tomorrow. Please contact me if I can be of assistance in the meanwhile. Thank you for this consultation.  Chief Complaint: Low heart rate  HPI:  Patient is a 25 year old female admitted to the GYN service for evaluation of severe right lower quadrant pain and found to have a right ovarian cyst.  She is status post laparoscopy with right ovarian cystectomy. Medical consult has been requested for bradycardia at rest with heart rate ranging from 43-50 bpm. Patient is seen and examined at bedside and denies feeling dizzy or lightheaded.  She denies having any syncopal episodes in the past.  She does not have a known history of bradycardia and does not take any prescription medications that would affect her heart rate.  She is active but not an athlete.  She does a history known thyroid disease and  denies weight gain, increased appetite, leg swelling, cold intolerance or night sweats.  Review of Systems:  As per HPI otherwise all other systems reviewed and negative.   Past Medical History:  Diagnosis Date   Acne    Anxiety    Attention deficit hyperactivity disorder (ADHD)    Chronic upper back pain    lower and upper back pain   Depression    Dyspnea    GERD (gastroesophageal reflux disease)    Gestational hypertension 11/14/2017   Migraine with aura    since elementary school   Past Surgical History:  Procedure Laterality Date   CESAREAN SECTION N/A 11/14/2017   Procedure: CESAREAN SECTION;  Surgeon: Will Bonnet, MD;  Location: [facility] ORS;  Service: Obstetrics;  Laterality: N/A;   ESOPHAGOGASTRODUODENOSCOPY Left 02/17/2019   Procedure: ESOPHAGOGASTRODUODENOSCOPY (EGD);  Surgeon: Virgel Manifold, MD;  Location: [facility] ENDOSCOPY;  Service: Endoscopy;  Laterality: Left;   INSERTION OF NON VAGINAL CONTRACEPTIVE DEVICE     LAPAROSCOPIC OVARIAN CYSTECTOMY Right 02/23/2019   Procedure: LAPAROSCOPIC OVARIAN CYSTECTOMY;  Surgeon: Homero Fellers, MD;  Location: [facility] ORS;  Service: Gynecology;  Laterality: Right;   OOPHORECTOMY Left 10/03/2017   Procedure: OOPHORECTOMY;  Surgeon: Homero Fellers, MD;  Location: [facility] ORS;  Service: Gynecology;  Laterality: Left;   OVARIAN CYST REMOVAL Left 10/03/2017   Procedure: OVARIAN CYSTECTOMY;  Surgeon: Homero Fellers, MD;  Location: [facility] ORS;  Service: Gynecology;  Laterality: Left;   Social History:  reports that she has never smoked. She has never used smokeless tobacco. She reports current drug use. Frequency: 7.00 times per week. Drug: Marijuana. She  reports that she does not drink alcohol.  Allergies  Allergen Reactions   Buspar [Buspirone]     Dystonic reaction - arms, legs, jaw "locked up"   Family History  Problem Relation Age of Onset   Hypertension Mother    Hypothyroidism Mother    Depression  Mother    Diabetes Maternal Grandmother     Prior to Admission medications   Medication Sig Start Date End Date Taking? Authorizing Provider  acetaminophen (TYLENOL) 500 MG tablet Take 2 tablets (1,000 mg total) by mouth every 6 (six) hours as needed for mild pain. 01/16/21  Yes Piscoya, Jacqulyn Bath, MD  doxycycline (VIBRAMYCIN) 100 MG capsule Take 1 capsule (100 mg total) by mouth 2 (two) times daily for 14 days. 02/26/21 03/12/21 Yes Will Bonnet, MD  Multiple Vitamins-Minerals (WOMENS MULTIVITAMIN) TABS Take 1 tablet by mouth daily.   Yes [provider]  promethazine (PHENERGAN) 12.5 MG tablet Take 1 tablet (12.5 mg total) by mouth every 6 (six) hours as needed. 02/24/21  Yes Merlyn Lot, MD  ibuprofen (ADVIL) 600 MG tablet Take 1 tablet (600 mg total) by mouth every 8 (eight) hours as needed for moderate pain. 01/16/21   Olean Ree, MD  ibuprofen (ADVIL) 600 MG tablet Take 1 tablet (600 mg total) by mouth every 6 (six) hours. 02/26/21   Will Bonnet, MD  metroNIDAZOLE (FLAGYL) 500 MG tablet Take 1 tablet (500 mg total) by mouth every 12 (twelve) hours for 14 days. 02/26/21 03/12/21  Will Bonnet, MD  oxyCODONE (OXY IR/ROXICODONE) 5 MG immediate release tablet Take 1 tablet (5 mg total) by mouth every 4 (four) hours as needed for severe pain. Patient not taking: Reported on 02/25/2021 01/16/21   Olean Ree, MD  oxyCODONE-acetaminophen (PERCOCET/ROXICET) 5-325 MG tablet Take 1 tablet by mouth every 6 (six) hours as needed for up to 7 days (breakthrough pain). 02/26/21 03/05/21  Will Bonnet, MD   Physical Exam: Blood pressure (!) 140/91, pulse 71, temperature 97.7 F (36.5 C), temperature source Oral, resp. rate 18, height '5\' 4"'$  (1.626 m), weight 49.9 kg, last menstrual period 02/17/2021, SpO2 99 %. Vitals:   02/26/21 0813 02/26/21 0828  BP: (!) 140/91   Pulse: (!) 48 71  Resp: 18   Temp: 97.7 F (36.5 C)   SpO2: 99%     General: Young female sitting up in bed.   Appears comfortable and in no distress. Eyes: No conjunctival pallor ENT: Within normal limits Neck: Supple, no JVD Cardiovascular: S1, S2 bradycardic Respiratory: Clear to auscultation bilaterally Abdomen: Bowel sounds present, soft, tender around incision site, nondistended Skin: Warm and dry Musculoskeletal: Able to move all extremities Psychiatric: Normal mood and affect Neurologic: Able to move all extremities, no weakness  Labs on Admission:  Basic Metabolic Panel: Recent Labs  Lab 02/24/21 0943 02/25/21 1536 02/26/21 0522  NA 140 138 139  K 3.9 3.4* 4.4  CL 105 105 107  CO2 '22 23 27  '$ GLUCOSE 98 108* 122*  BUN '11 13 12  '$ CREATININE 0.77 0.71 0.65  CALCIUM 9.3 9.1 8.7*   Liver Function Tests: Recent Labs  Lab 02/24/21 0943 02/25/21 1536  AST 20 19  ALT 20 22  ALKPHOS 66 63  BILITOT 1.2 1.1  PROT 8.3* 7.8  ALBUMIN 4.9 4.9   Recent Labs  Lab 02/24/21 0943 02/25/21 1537  LIPASE 31 32   No results for input(s): AMMONIA in the last 168 hours. CBC: Recent Labs  Lab 02/24/21 0943 02/25/21 1536  02/26/21 0522  WBC 10.4 18.8* 13.9*  HGB 13.6 12.8 12.1  HCT 40.5 38.0 36.3  MCV 85.8 87.0 89.0  PLT 312 313 257   Cardiac Enzymes: No results for input(s): CKTOTAL, CKMB, CKMBINDEX, TROPONINI in the last 168 hours. BNP: Invalid input(s): POCBNP CBG: No results for input(s): GLUCAP in the last 168 hours.  Radiological Exams on Admission: CT ABDOMEN PELVIS W CONTRAST  Result Date: 02/24/2021 CLINICAL DATA:  Abdominal pain, acute, nonlocalized. Recent cholecystectomy. EXAM: CT ABDOMEN AND PELVIS WITH CONTRAST TECHNIQUE: Multidetector CT imaging of the abdomen and pelvis was performed using the standard protocol following bolus administration of intravenous contrast. CONTRAST:  8m OMNIPAQUE IOHEXOL 350 MG/ML SOLN COMPARISON:  02/13/2019 FINDINGS: Lower chest: Lung bases are clear. Hepatobiliary: Periportal edema noted in the liver. Prominent focal fat near the  falciform ligament. Gallbladder has been removed. Small amount of fluid along the inferior right hepatic lobe. Liver is heterogeneous but this may be associated with the phase of contrast. There may be focal fat near the near the gallbladder fossa. Common bile duct measures 4 mm. Pancreas: Diffuse low-density without inflammatory changes or duct dilatation. Spleen: Normal in size without focal abnormality. Adrenals/Urinary Tract: Normal adrenal glands. Normal appearance of both kidneys without hydronephrosis. Normal appearance of the urinary bladder. Stomach/Bowel: Moderate amount of stool in the rectum. There may be a normal appendix in the right pelvis on sequence 2, image 54 and sequence 5 image 32. However, the appendix is poorly characterized on this examination. No clear evidence for appendix inflammation. No evidence for small bowel dilatation. Normal appearance of the stomach. Vascular/Lymphatic: Normal appearance of the arterial structures. Main visceral arteries are patent. There is contrast within the iliac veins and IVC. No significant contrast within the hepatic veins. No abdominal or pelvic lymph node enlargement. Reproductive: Large low-density structure in the right hemipelvis measures 5.9 x 4.7 x 6.0 cm. Small amount of fluid along the right side of the pelvis. Cannot exclude a tubular fluid-filled structure in the right adnexa. Normal appearance of the uterus. No gross abnormality to the left adnexa. Other: Patient has vaginal tampon. Trace fluid along the right side of the pelvis but no significant pelvic free fluid. There is trace fluid along the inferior right hepatic lobe on sequence 2 image 37. Negative for free air. Postsurgical changes in the periumbilical region along the anterior abdomen. Musculoskeletal: No acute bone abnormality. IMPRESSION: 1. 6.0 cm low-density structure in the right hemipelvis. This is suggestive for a right ovarian or adnexal cystic structure. Cannot exclude right  hydrosalpinx. Recommend clinical correlation with regards to tubo-ovarian abscess. Recommend further characterization with a pelvic ultrasound. 2. Trace perihepatic fluid along the inferior right hepatic lobe. This may be associated with the right pelvic cystic structure but nonspecific. 3. Liver has a very heterogeneous appearance with periportal edema. Periportal edema could be secondary to recent cholecystectomy. Heterogeneity in liver probably represents a combination of the contrast phase and areas of geographic hepatic steatosis. In addition, pancreas diffusely hypodense but no inflammatory changes. Again, this may be related to the phase of contrast rather than pancreatic inflammation. Electronically Signed   By: AMarkus DaftM.D.   On: 02/24/2021 16:10   [place]PELV[place] COMPLETE WITH TRANSVAGINAL  Result Date: 02/25/2021 CLINICAL DATA:  Pelvic pain for 3-4 days. Previous LEFT oophorectomy. LMP 02/20/2021. EXAM: TRANSABDOMINAL AND TRANSVAGINAL ULTRASOUND OF PELVIS TECHNIQUE: Both transabdominal and transvaginal ultrasound examinations of the pelvis were performed. Transabdominal technique was performed for global imaging of the pelvis  including uterus, ovaries, adnexal regions, and pelvic cul-de-sac. It was necessary to proceed with endovaginal exam following the transabdominal exam to visualize the uterus and endometrium, ovaries, adnexal regions. COMPARISON:  Pelvic ultrasound on 02/24/2021, CT of the abdomen and pelvis on 02/24/2021 FINDINGS: Uterus Measurements: 7.7 x 3.4 x 5.2 centimeters = volume: 70.8 mL. No fibroids or other mass visualized. Endometrium Thickness: 2.4 millimeters.  No focal abnormality visualized. Right ovary Measurements: 6.0 x 4.4 x 5.7 centimeters = volume: 79.02 mL. Cystic area in the RIGHT ovary is 5.8 x 3.8 x 5.3 centimeters. A single internal septation is present and stable in appearance. Left ovary Measurements: Surgically absent Other findings Trace free pelvic fluid surrounding  the RIGHT ovary. Color Doppler noted in the RIGHT ovarian stroma. IMPRESSION: 1. Stable appearance of cyst within the RIGHT ovary. Recommend follow-up US in 3-6 months. Note: This recommendation does not apply to premenarchal patients or to those with increased risk (genetic, family history, elevated tumor markers or other high-risk factors) of ovarian cancer. Reference: Radiology 2019 Nov; 293(2):359-371. 2. No evidence for interval rupture or torsion. 3. Surgically absent LEFT ovary. 4. Normal appearance of the uterus. Electronically Signed   By: Nolon Nations M.D.   On: 02/25/2021 18:08   US PELVIC COMPLETE W TRANSVAGINAL AND TORSION R/O  Result Date: 02/24/2021 CLINICAL DATA:  Pelvic pain for 2-3 days EXAM: TRANSABDOMINAL AND TRANSVAGINAL ULTRASOUND OF PELVIS DOPPLER ULTRASOUND OF OVARIES TECHNIQUE: Both transabdominal and transvaginal ultrasound examinations of the pelvis were performed. Transabdominal technique was performed for global imaging of the pelvis including uterus, ovaries, adnexal regions, and pelvic cul-de-sac. It was necessary to proceed with endovaginal exam following the transabdominal exam to visualize the uterus, endometrium, ovaries, and adnexa. Color and duplex Doppler ultrasound was utilized to evaluate blood flow to the ovaries. COMPARISON:  None. FINDINGS: Uterus Measurements: 8.1 x 3.8 x 4.6 cm = volume: 74 mL. No fibroids or other mass visualized. Endometrium Thickness: 3 mm.  No focal abnormality visualized. Right ovary Measurements: 6.6 x 4.3 x 5.5 cm = volume: 81 mL. The right ovary is enlarged by a thinly septated cyst measuring 5.3 x 4.0 x 5.0 cm. Left ovary Surgically absent. Pulsed Doppler evaluation of the right ovary demonstrates normal low-resistance arterial and venous waveforms. Other findings No abnormal free fluid. IMPRESSION: 1. The right ovary is enlarged by a thinly septated cyst measuring 5.3 x 4.0 x 5.0 cm, almost certainly benign and functional in the  reproductive age setting. Recommend follow-up US in 3-6 months to ensure stability or resolution. Note: This recommendation does not apply to premenarchal patients or to those with increased risk (genetic, family history, elevated tumor markers or other high-risk factors) of ovarian cancer. Reference: Radiology 2019 Nov; 293(2):359-371. 2. There is normal arterial and venous Doppler flow to the right ovary. Please note, however that intermittent or incomplete ovarian torsion cannot be strictly excluded by ultrasound in any enlarged ovary. 3. The left ovary is surgically absent. 4. Unremarkable uterus. Electronically Signed   By: Eddie Candle M.D.   On: 02/24/2021 19:18    EKG: Independently reviewed.  Sinus bradycardia  Time spent: 7  Ibrahem Volkman [facility] Pager [phone]  If 7PM-7AM, please contact night-coverage [email or web address] Password [email or web address] 02/26/2021, 9:18 AM

## 2021-02-26 NOTE — Progress Notes (Deleted)
Pt discharged with infant.  Discharge instructions, prescriptions and follow up appointment given to and reviewed with pt. Pt verbalized understanding. Escorted out by auxillary. 

## 2021-02-26 NOTE — Telephone Encounter (Signed)
Scheduled with EP Dr. Quentin Ore.

## 2021-02-26 NOTE — Plan of Care (Signed)
Transferred to Room 344 from PACU. Tiffany Guzman. Is sl. Drowsy; however, she is oriented and and appropriate. She denies c/o. She does have a resting heart rate of 44.  HR is regular. Positive pedal pulses equal and strong with cap. Refill < 2 seconds.I reported this to Dr. Glennon Mac and no new orders were received. Tiffany Guzman. Placed on continuous Pulse Ox. Tiffany Guzman. Assisted up to BR. She had steady gait and denied syncope. Tiffany Guzman. Tolerated ambulation well. All other VS WNL. Tiffany Guzman. Denies all c/o. Tiffany Guzman. Oriented to Room, Safety and Security, Fall Prevention and POC. Tiffany Guzman. V/O. Dietary Consult placed as per Tiffany Guzman. Request due to , "no interest" in eating. Tiffany Guzman. Is very thin.

## 2021-02-26 NOTE — Anesthesia Postprocedure Evaluation (Signed)
Anesthesia Post Note  Patient: Tiffany Guzman  Procedure(s) Performed: LAPAROSCOPY DIAGNOSTIC OVARIAN CYSTECTOMY (Right)  Patient location during evaluation: PACU Anesthesia Type: General Level of consciousness: awake and alert and oriented Pain management: pain level controlled Vital Signs Assessment: post-procedure vital signs reviewed and stable Respiratory status: spontaneous breathing, nonlabored ventilation and respiratory function stable Cardiovascular status: blood pressure returned to baseline and stable Postop Assessment: no signs of nausea or vomiting Anesthetic complications: no   No notable events documented.   Last Vitals:  Vitals:   02/26/21 0300 02/26/21 0305  BP:  (!) 144/92  Pulse: (!) 43 (!) 50  Resp:  20  Temp:  36.6 C  SpO2: 98% 98%    Last Pain:  Vitals:   02/26/21 0305  TempSrc: Oral  PainSc:                  Dhani Dannemiller

## 2021-02-26 NOTE — Final Progress Note (Addendum)
DC Summary Discharge Summary   Patient ID: Tiffany Guzman [id number] 25 y.o. 1996/06/14  Admit date: 02/25/2021  Discharge date: 02/26/2021  Principal Diagnoses:  Hospital Problem List as of 02/26/2021          Priority Resolved POA     Unprioritized     * (Principal) Right lower quadrant abdominal pain   Yes     Right ovarian cyst   Yes     Leukocytosis   Yes     Status post ovarian cystectomy   Not Applicable     Secondary Diagnoses:  Bradycardia  Procedures performed during the hospitalization:  1) Pelvic ultrasound 02/25/2021 2) Operative laparoscopy with right ovarian cystectomy 3) EKG 4) Hospitalist consult  HPI: 25 y.o. [id number] female who presented to the ER yesterday with acute-onset right lower quadrant abdominal pain. She had a CT scan that showed periportal edema, small amount of fluid along the inferior right hepatic lobe. The appendix is poorly characterized. No clear evidence of appendix inflammation. Low-density structure in right hemipelvis measuring 5.9 x 4.78 x 6.0 cm with a small amount of fluid along the right side of the pelvis.  A pelvic ultrasound yesterday showed an enlarged right ovary with a cyst has a thin septation. The cyst measured 5.3 x 4.0 x 5.0 cm.  She was discharged yesterday to follow up with gynecology. She returned today with severe pain, 10 out of 10, that does not radiate.  The pain is described as a squeezing, pressure sensation.  She notes that the pain is in her right lower quadrant. She notes no alleviating or palliating factors. Today she has had nausea and emesis with no other associated symptoms. Of note, she had a left oophorectomy during her pregnancy a few years ago. She also had a cholecystectomy about 1.5 months ago.    Past Medical History:  Diagnosis Date   Acne    Anxiety    Attention deficit hyperactivity disorder (ADHD)    Chronic upper back pain    lower and upper back pain   Depression    Dyspnea    GERD  (gastroesophageal reflux disease)    Gestational hypertension 11/14/2017   Migraine with aura    since elementary school    Past Surgical History:  Procedure Laterality Date   CESAREAN SECTION N/A 11/14/2017   Procedure: CESAREAN SECTION;  Surgeon: Will Bonnet, MD;  Location: [facility] ORS;  Service: Obstetrics;  Laterality: N/A;   ESOPHAGOGASTRODUODENOSCOPY Left 02/17/2019   Procedure: ESOPHAGOGASTRODUODENOSCOPY (EGD);  Surgeon: Virgel Manifold, MD;  Location: [facility] ENDOSCOPY;  Service: Endoscopy;  Laterality: Left;   INSERTION OF NON VAGINAL CONTRACEPTIVE DEVICE     LAPAROSCOPIC OVARIAN CYSTECTOMY Right 02/23/2019   Procedure: LAPAROSCOPIC OVARIAN CYSTECTOMY;  Surgeon: Homero Fellers, MD;  Location: [facility] ORS;  Service: Gynecology;  Laterality: Right;   OOPHORECTOMY Left 10/03/2017   Procedure: OOPHORECTOMY;  Surgeon: Homero Fellers, MD;  Location: [facility] ORS;  Service: Gynecology;  Laterality: Left;   OVARIAN CYST REMOVAL Left 10/03/2017   Procedure: OVARIAN CYSTECTOMY;  Surgeon: Homero Fellers, MD;  Location: [facility] ORS;  Service: Gynecology;  Laterality: Left;    Allergies  Allergen Reactions   Buspar [Buspirone]     Dystonic reaction - arms, legs, jaw "locked up"    Social History   Tobacco Use   Smoking status: Never   Smokeless tobacco: Never  Vaping Use   Vaping Use: Every day   Start date: 09/20/2019   Substances: Nicotine, Flavoring  Substance Use Topics   Alcohol use: No    Alcohol/week: 0.0 standard drinks   Drug use: Yes    Frequency: 7.0 times per week    Types: Marijuana    Comment: has stopped for 10 days    Family History  Problem Relation Age of Onset   Hypertension Mother    Hypothyroidism Mother    Depression Mother    Diabetes Maternal Grandmother     Hospital Course:  The patient was admitted through the ER and  taken to the OR for an operative laparoscopy with right ovarian cystectomy. She was watched overnight given  her bradycardia, which was reportedly of SA origin.She had a hospitalist consult.  See separate note for recommendations.  The consult occurred prior to discharge. She was otherwise meeting discharge criteria; she was ambulating, tolerating PO, voiding spontaneously and had good pain control. She had normal vital signs apart from her heart rate. She was in stable condition and was discharged home.   Discharge Exam: BP (!) 144/92 (BP Location: Left Arm)   Pulse (!) 48   Temp 97.9 F (36.6 C) (Oral)   Resp 20   Ht '5\' 4"'$  (1.626 m)   Wt 49.9 kg   LMP 02/17/2021   SpO2 97%   BMI 18.88 kg/m  Physical Exam Constitutional:      General: She is not in acute distress.    Appearance: Normal appearance. She is well-developed.  HENT:     Head: Normocephalic and atraumatic.  Eyes:     General: No scleral icterus.    Conjunctiva/sclera: Conjunctivae normal.  Cardiovascular:     Rate and Rhythm: Regular rhythm. Bradycardia present.     Heart sounds: No murmur heard.   No friction rub. No gallop.  Pulmonary:     Effort: Pulmonary effort is normal. No respiratory distress.     Breath sounds: Normal breath sounds. No wheezing or rales.  Abdominal:     General: Bowel sounds are normal. There is no distension.     Palpations: Abdomen is soft. There is no mass.     Tenderness: There is no abdominal tenderness. There is no guarding or rebound.     Comments: Incisions clean, dry, and intact  Musculoskeletal:        General: Normal range of motion.     Cervical back: Normal range of motion and neck supple.  Neurological:     General: No focal deficit present.     Mental Status: She is alert and oriented to person, place, and time.     Cranial Nerves: No cranial nerve deficit.  Skin:    General: Skin is warm and dry.     Findings: No erythema.  Psychiatric:        Mood and Affect: Mood normal.        Behavior: Behavior normal.        Judgment: Judgment normal.     Condition at Discharge:  Stable  Complications affecting treatment: None  Discharge Medications:  Allergies as of 02/26/2021       Reactions   Buspar [buspirone]    Dystonic reaction - arms, legs, jaw "locked up"        Medication List     STOP taking these medications    oxyCODONE 5 MG immediate release tablet Commonly known as: Oxy IR/ROXICODONE       TAKE these medications    acetaminophen 500 MG tablet Commonly known as: TYLENOL Take 2 tablets (1,000 mg total) by  mouth every 6 (six) hours as needed for mild pain.   doxycycline 100 MG capsule Commonly known as: VIBRAMYCIN Take 1 capsule (100 mg total) by mouth 2 (two) times daily for 14 days.   ibuprofen 600 MG tablet Commonly known as: ADVIL Take 1 tablet (600 mg total) by mouth every 6 (six) hours. What changed:  when to take this reasons to take this   metroNIDAZOLE 500 MG tablet Commonly known as: FLAGYL Take 1 tablet (500 mg total) by mouth every 12 (twelve) hours for 14 days.   oxyCODONE-acetaminophen 5-325 MG tablet Commonly known as: PERCOCET/ROXICET Take 1 tablet by mouth every 6 (six) hours as needed for up to 7 days (breakthrough pain).   promethazine 12.5 MG tablet Commonly known as: PHENERGAN Take 1 tablet (12.5 mg total) by mouth every 6 (six) hours as needed.   Womens Multivitamin Tabs Take 1 tablet by mouth daily.         Follow-up arrangements:   Follow-up Information     Will Bonnet, MD. Schedule an appointment as soon as possible for a visit in 1 week(s).   Specialty: Obstetrics and Gynecology Why: Post-op follow up Contact information: [address] [place] [address] [phone]                Follow up with PCP in 2 weeks regarding Bradycardia   Discharge Disposition: Discharge to home/self care     Signed: Prentice Docker, MD  02/26/2021 7:58 AM

## 2021-02-26 NOTE — Progress Notes (Signed)
Pt discharged home.  Discharge instructions, prescriptions and follow up appointment given to and reviewed with pt.  Pt verbalized understanding.  Escorted by auxillary. 

## 2021-02-26 NOTE — Telephone Encounter (Signed)
-----   Message from [place], PA-C sent at 02/26/2021 10:27 AM EDT ----- Regarding: hospital follow-up PT hospitalized for bradycardia, not seen by cardiology during visit. She is asymptomatic. Hospitalist requesting outpatient follow-up. Please schedule patient for ER follow-up in 1-2 weeks (if possible). She is a new patient. Thanks!

## 2021-02-27 ENCOUNTER — Other Ambulatory Visit: Payer: Self-pay

## 2021-02-27 ENCOUNTER — Emergency Department: Payer: Medicaid Other

## 2021-02-27 ENCOUNTER — Inpatient Hospital Stay
Admission: EM | Admit: 2021-02-27 | Discharge: 2021-03-01 | DRG: 392 | Disposition: A | Discharge status: Home / Self Care | Payer: Medicaid Other | Attending: Obstetrics and Gynecology | Admitting: Obstetrics and Gynecology

## 2021-02-27 DIAGNOSIS — R001 Bradycardia, unspecified: Secondary | ICD-10-CM | POA: Diagnosis present

## 2021-02-27 DIAGNOSIS — F419 Anxiety disorder, unspecified: Secondary | ICD-10-CM | POA: Diagnosis present

## 2021-02-27 DIAGNOSIS — G8918 Other acute postprocedural pain: Secondary | ICD-10-CM

## 2021-02-27 DIAGNOSIS — F32A Depression, unspecified: Secondary | ICD-10-CM | POA: Diagnosis present

## 2021-02-27 DIAGNOSIS — R112 Nausea with vomiting, unspecified: Secondary | ICD-10-CM | POA: Diagnosis not present

## 2021-02-27 DIAGNOSIS — D72829 Elevated white blood cell count, unspecified: Secondary | ICD-10-CM | POA: Diagnosis present

## 2021-02-27 DIAGNOSIS — Z9889 Other specified postprocedural states: Secondary | ICD-10-CM

## 2021-02-27 DIAGNOSIS — R932 Abnormal findings on diagnostic imaging of liver and biliary tract: Secondary | ICD-10-CM

## 2021-02-27 DIAGNOSIS — R1084 Generalized abdominal pain: Secondary | ICD-10-CM | POA: Diagnosis not present

## 2021-02-27 DIAGNOSIS — R109 Unspecified abdominal pain: Secondary | ICD-10-CM | POA: Diagnosis not present

## 2021-02-27 DIAGNOSIS — F509 Eating disorder, unspecified: Secondary | ICD-10-CM | POA: Diagnosis present

## 2021-02-27 DIAGNOSIS — F4010 Social phobia, unspecified: Secondary | ICD-10-CM | POA: Diagnosis present

## 2021-02-27 DIAGNOSIS — Z8742 Personal history of other diseases of the female genital tract: Secondary | ICD-10-CM

## 2021-02-27 DIAGNOSIS — R609 Edema, unspecified: Secondary | ICD-10-CM | POA: Diagnosis present

## 2021-02-27 HISTORY — DX: Nausea with vomiting, unspecified: R11.2

## 2021-02-27 LAB — URINE DRUG SCREEN, QUALITATIVE (ARMC ONLY)
Amphetamines, Ur Screen: NOT DETECTED
Barbiturates, Ur Screen: NOT DETECTED
Benzodiazepine, Ur Scrn: POSITIVE — AB
Cannabinoid 50 Ng, Ur ~~LOC~~: POSITIVE — AB
Cocaine Metabolite,Ur ~~LOC~~: NOT DETECTED
MDMA (Ecstasy)Ur Screen: NOT DETECTED
Methadone Scn, Ur: NOT DETECTED
Opiate, Ur Screen: POSITIVE — AB
Phencyclidine (PCP) Ur S: NOT DETECTED
Tricyclic, Ur Screen: NOT DETECTED

## 2021-02-27 LAB — CBC
HCT: 37.2 % (ref 36.0–46.0)
HCT: 32.1 % — ABNORMAL LOW (ref 36.0–46.0)
Hemoglobin: 12.5 g/dL (ref 12.0–15.0)
Hemoglobin: 10.7 g/dL — ABNORMAL LOW (ref 12.0–15.0)
MCH: 29.5 pg (ref 26.0–34.0)
MCH: 28.6 pg (ref 26.0–34.0)
MCHC: 33.6 g/dL (ref 30.0–36.0)
MCHC: 33.3 g/dL (ref 30.0–36.0)
MCV: 87.7 fL (ref 80.0–100.0)
MCV: 85.8 fL (ref 80.0–100.0)
Platelets: 287 10*3/uL (ref 150–400)
Platelets: 219 10*3/uL (ref 150–400)
RBC: 4.24 MIL/uL (ref 3.87–5.11)
RBC: 3.74 MIL/uL — ABNORMAL LOW (ref 3.87–5.11)
RDW: 14.4 % (ref 11.5–15.5)
RDW: 13.8 % (ref 11.5–15.5)
WBC: 22.1 10*3/uL — ABNORMAL HIGH (ref 4.0–10.5)
WBC: 14.8 10*3/uL — ABNORMAL HIGH (ref 4.0–10.5)
nRBC: 0 % (ref 0.0–0.2)
nRBC: 0 % (ref 0.0–0.2)

## 2021-02-27 LAB — COMPREHENSIVE METABOLIC PANEL
ALT: 20 U/L (ref 0–44)
AST: 18 U/L (ref 15–41)
Albumin: 4.6 g/dL (ref 3.5–5.0)
Alkaline Phosphatase: 53 U/L (ref 38–126)
Anion gap: 7 (ref 5–15)
BUN: 15 mg/dL (ref 6–20)
CO2: 26 mmol/L (ref 22–32)
Calcium: 9 mg/dL (ref 8.9–10.3)
Chloride: 105 mmol/L (ref 98–111)
Creatinine, Ser: 0.68 mg/dL (ref 0.44–1.00)
GFR, Estimated: >60 mL/min (ref >60)
Glucose, Bld: 100 mg/dL — ABNORMAL HIGH (ref 70–99)
Potassium: 3.3 mmol/L — ABNORMAL LOW (ref 3.5–5.1)
Sodium: 138 mmol/L (ref 135–145)
Total Bilirubin: 1.1 mg/dL (ref 0.3–1.2)
Total Protein: 7.1 g/dL (ref 6.5–8.1)

## 2021-02-27 LAB — BASIC METABOLIC PANEL
Anion gap: 9 (ref 5–15)
BUN: 11 mg/dL (ref 6–20)
CO2: 26 mmol/L (ref 22–32)
Calcium: 7.9 mg/dL — ABNORMAL LOW (ref 8.9–10.3)
Chloride: 101 mmol/L (ref 98–111)
Creatinine, Ser: 0.71 mg/dL (ref 0.44–1.00)
GFR, Estimated: >60 mL/min (ref >60)
Glucose, Bld: 125 mg/dL — ABNORMAL HIGH (ref 70–99)
Potassium: 3.2 mmol/L — ABNORMAL LOW (ref 3.5–5.1)
Sodium: 136 mmol/L (ref 135–145)

## 2021-02-27 LAB — BRAIN NATRIURETIC PEPTIDE: B Natriuretic Peptide: 105.3 pg/mL — ABNORMAL HIGH (ref 0.0–100.0)

## 2021-02-27 MED ORDER — HYDROMORPHONE HCL 1 MG/ML IJ SOLN
0.2000 mg | INTRAMUSCULAR | Status: DC | PRN
  Administered 2021-02-27 – 2021-02-28 (×7): 0.6 mg via INTRAVENOUS
  Filled 2021-02-27 (×7): qty 1

## 2021-02-27 MED ORDER — SODIUM CHLORIDE 0.9 % IV SOLN
12.5000 mg | Freq: Four times a day (QID) | INTRAVENOUS | Status: DC | PRN
  Administered 2021-02-27: 12.5 mg via INTRAVENOUS
  Filled 2021-02-27: qty 0.5

## 2021-02-27 MED ORDER — HYDROMORPHONE HCL 2 MG PO TABS
2.0000 mg | ORAL_TABLET | ORAL | Status: DC | PRN
  Administered 2021-02-28 – 2021-03-01 (×3): 2 mg via ORAL
  Filled 2021-02-27 (×3): qty 1

## 2021-02-27 MED ORDER — FENTANYL CITRATE (PF) 100 MCG/2ML IJ SOLN: 50.0000 ug | Freq: Once | INTRAMUSCULAR | Status: DC

## 2021-02-27 MED ORDER — IOHEXOL 350 MG/ML SOLN
75.0000 mL | Freq: Once | INTRAVENOUS | Status: AC | PRN
  Administered 2021-02-27: 75 mL via INTRAVENOUS

## 2021-02-27 MED ORDER — MENTHOL 3 MG MT LOZG
1.0000 | LOZENGE | OROMUCOSAL | Status: DC | PRN
  Filled 2021-02-27: qty 9

## 2021-02-27 MED ORDER — IBUPROFEN 600 MG PO TABS
600.0000 mg | ORAL_TABLET | Freq: Four times a day (QID) | ORAL | Status: DC
  Administered 2021-02-28 – 2021-03-01 (×4): 600 mg via ORAL
  Filled 2021-02-27 (×6): qty 1

## 2021-02-27 MED ORDER — ONDANSETRON HCL 4 MG/2ML IJ SOLN
4.0000 mg | Freq: Four times a day (QID) | INTRAMUSCULAR | Status: DC | PRN
  Administered 2021-02-27 – 2021-02-28 (×2): 4 mg via INTRAVENOUS
  Filled 2021-02-27 (×2): qty 2

## 2021-02-27 MED ORDER — KETOROLAC TROMETHAMINE 30 MG/ML IJ SOLN
30.0000 mg | Freq: Four times a day (QID) | INTRAMUSCULAR | Status: AC
  Administered 2021-02-27 – 2021-02-28 (×4): 30 mg via INTRAVENOUS
  Filled 2021-02-27 (×4): qty 1

## 2021-02-27 MED ORDER — LORAZEPAM 2 MG/ML IJ SOLN
1.0000 mg | Freq: Once | INTRAMUSCULAR | Status: AC
  Administered 2021-02-27: 1 mg via INTRAVENOUS
  Filled 2021-02-27: qty 1

## 2021-02-27 MED ORDER — ONDANSETRON HCL 4 MG/2ML IJ SOLN
INTRAMUSCULAR | Status: AC
  Administered 2021-02-27: 4 mg via INTRAVENOUS
  Filled 2021-02-27: qty 2

## 2021-02-27 MED ORDER — ONDANSETRON HCL 4 MG PO TABS: 4.0000 mg | ORAL_TABLET | Freq: Four times a day (QID) | ORAL | Status: DC | PRN

## 2021-02-27 MED ORDER — ONDANSETRON HCL 4 MG/2ML IJ SOLN
INTRAMUSCULAR | Status: AC
  Administered 2021-02-27: 4 mg
  Filled 2021-02-27: qty 2

## 2021-02-27 MED ORDER — SIMETHICONE 80 MG PO CHEW: 80.0000 mg | CHEWABLE_TABLET | Freq: Four times a day (QID) | ORAL | Status: DC | PRN

## 2021-02-27 MED ORDER — PANTOPRAZOLE SODIUM 40 MG IV SOLR
40.0000 mg | INTRAVENOUS | Status: DC
  Administered 2021-02-27 – 2021-02-28 (×2): 40 mg via INTRAVENOUS
  Filled 2021-02-27 (×3): qty 40

## 2021-02-27 MED ORDER — DEXTROSE-NACL 5-0.45 % IV SOLN: INTRAVENOUS | Status: DC

## 2021-02-27 MED ORDER — FENTANYL CITRATE (PF) 100 MCG/2ML IJ SOLN
INTRAMUSCULAR | Status: AC
  Administered 2021-02-27: 100 ug
  Filled 2021-02-27: qty 2

## 2021-02-27 NOTE — Consult Note (Addendum)
Vonda Antigua, MD [address] [place], [place], [place], [address] [address], [place], [place], [place], [address] Phone: [phone]  Fax: [phone]  Consultation  Referring Provider:     Dr Georgianne Fick Primary Care Physician:  [facility], [place] Reason for Consultation:     Nausea vomiting, periportal edema  Date of Admission:  02/27/2021 Date of Consultation:  02/27/2021         HPI:   Tiffany Guzman is a 25 y.o. female who underwent laparoscopic right ovarian cystectomy 2 days ago, robotic cholecystectomy on January 16, 2021, with GI being consulted for nausea vomiting, and periportal edema noted on imaging.  On questioning the patient, she does state that she has had nausea vomiting symptoms for a long time, even prior to her cholecystectomy.  States she wakes up and has to think about when she can eat as she is usually nauseous throughout the day.  Currently, is reporting bilateral lower quadrant abdominal discomfort, mostly at the sides of her recent surgery.  Has not had any vomiting today.  Denies any hematemesis.  Denies any diarrhea.  Has not had a bowel movement in 2 days.  Reports pain at the incision sites in the bilateral lower quadrant, 3 / 10, nonradiating, sharp, improves with pain medications.  Worsens with certain movements.  Previous records reveal that patient has been evaluated for nausea vomiting, abdominal pain symptoms in the past as well.  Saw Dr. Allen Norris in clinic in February 2021 and symptoms had improved after stopping marijuana use.  She was doing well on H2 RA for heartburn at that time.  Prior to this she was also seen in GI consult by me in July 2020 for abdominal pain, nausea and vomiting, with U tox positive for marijuana at that time.  Due to multiple ER visits with the symptoms, she underwent EGD which was normal.  Biopsies were unremarkable and negative for H. pylori.  CT abdomen pelvis today reported diffuse heterogeneity of the  liver with extensive periportal edema, reported to be seen both with intrinsic liver disease as well as in the setting of aggressive hydration.  Correlation with history and LFTs were recommended.  Labs reveal normal liver enzymes  BNP is mildly elevated to 105  CBC shows normal platelets  Past Medical History:  Diagnosis Date   Acne    Anxiety    Attention deficit hyperactivity disorder (ADHD)    Chronic upper back pain    lower and upper back pain   Depression    Dyspnea    GERD (gastroesophageal reflux disease)    Gestational hypertension 11/14/2017   Migraine with aura    since elementary school   Postoperative nausea and vomiting 02/27/2021    Past Surgical History:  Procedure Laterality Date   CESAREAN SECTION N/A 11/14/2017   Procedure: CESAREAN SECTION;  Surgeon: Will Bonnet, MD;  Location: [facility] ORS;  Service: Obstetrics;  Laterality: N/A;   ESOPHAGOGASTRODUODENOSCOPY Left 02/17/2019   Procedure: ESOPHAGOGASTRODUODENOSCOPY (EGD);  Surgeon: Virgel Manifold, MD;  Location: [facility] ENDOSCOPY;  Service: Endoscopy;  Laterality: Left;   INSERTION OF NON VAGINAL CONTRACEPTIVE DEVICE     LAPAROSCOPIC OVARIAN CYSTECTOMY Right 02/23/2019   Procedure: LAPAROSCOPIC OVARIAN CYSTECTOMY;  Surgeon: Homero Fellers, MD;  Location: [facility] ORS;  Service: Gynecology;  Laterality: Right;   LAPAROSCOPY N/A 02/25/2021   Procedure: LAPAROSCOPY DIAGNOSTIC;  Surgeon: Will Bonnet, MD;  Location: [facility];  Service: Gynecology;  Laterality: N/A;   OOPHORECTOMY  Left 10/03/2017   Procedure: OOPHORECTOMY;  Surgeon: Homero Fellers, MD;  Location: [facility];  Service: Gynecology;  Laterality: Left;   OVARIAN CYST REMOVAL Left 10/03/2017   Procedure: OVARIAN CYSTECTOMY;  Surgeon: Homero Fellers, MD;  Location: [facility] ORS;  Service: Gynecology;  Laterality: Left;   OVARIAN CYST REMOVAL Right 02/25/2021   Procedure: OVARIAN CYSTECTOMY;  Surgeon: Will Bonnet, MD;  Location:  [facility];  Service: Gynecology;  Laterality: Right;    Prior to Admission medications   Medication Sig Start Date End Date Taking? Authorizing Provider  acetaminophen (TYLENOL) 500 MG tablet Take 2 tablets (1,000 mg total) by mouth every 6 (six) hours as needed for mild pain. 01/16/21   Olean Ree, MD  doxycycline (VIBRAMYCIN) 100 MG capsule Take 1 capsule (100 mg total) by mouth 2 (two) times daily for 14 days. 02/26/21 03/12/21  Will Bonnet, MD  ibuprofen (ADVIL) 600 MG tablet Take 1 tablet (600 mg total) by mouth every 6 (six) hours. 02/26/21   Will Bonnet, MD  metroNIDAZOLE (FLAGYL) 500 MG tablet Take 1 tablet (500 mg total) by mouth every 12 (twelve) hours for 14 days. 02/26/21 03/12/21  Will Bonnet, MD  Multiple Vitamins-Minerals (WOMENS MULTIVITAMIN) TABS Take 1 tablet by mouth daily.    [provider]  oxyCODONE-acetaminophen (PERCOCET/ROXICET) 5-325 MG tablet Take 1 tablet by mouth every 6 (six) hours as needed for up to 7 days (breakthrough pain). 02/26/21 03/05/21  Will Bonnet, MD  promethazine (PHENERGAN) 12.5 MG tablet Take 1 tablet (12.5 mg total) by mouth every 6 (six) hours as needed. 02/24/21   Merlyn Lot, MD    Family History  Problem Relation Age of Onset   Hypertension Mother    Hypothyroidism Mother    Depression Mother    Diabetes Maternal Grandmother      Social History   Tobacco Use   Smoking status: Never   Smokeless tobacco: Never  Vaping Use   Vaping Use: Every day   Start date: 09/20/2019   Substances: Nicotine, Flavoring  Substance Use Topics   Alcohol use: No    Alcohol/week: 0.0 standard drinks   Drug use: Yes    Frequency: 7.0 times per week    Types: Marijuana    Comment: has stopped for 10 days    Allergies as of 02/27/2021 - Review Complete 02/27/2021  Allergen Reaction Noted   Buspar [buspirone]  08/11/2019    Review of Systems:    All systems reviewed and negative except where noted in HPI.   Physical  Exam:  Constitutional: General:   Alert,  Well-developed, well-nourished, pleasant and cooperative in NAD BP 138/85 (BP Location: Left Arm)   Pulse (!) 44 Comment: notify Emily, RN  Temp 98.5 F (36.9 C)   Resp 18   Ht '5\' 4"'$  (1.626 m)   Wt 47.2 kg   LMP 02/17/2021   SpO2 96%   BMI 17.85 kg/m   Eyes:  Sclera clear, no icterus.   Conjunctiva pink. PERRLA  Ears:  No scars, lesions or masses, Normal auditory acuity. Nose:  No deformity, discharge, or lesions. Mouth:  No deformity or lesions, oropharynx pink & moist.  Neck:  Supple; no masses or thyromegaly.  Respiratory: Normal respiratory effort, Normal percussion  Gastrointestinal:  Normal bowel sounds.  No bruits.  Soft, non-tender and non-distended without masses, hepatosplenomegaly or hernias noted.  No guarding or rebound tenderness.     Cardiac: No clubbing or edema.  No cyanosis. Normal  posterior tibial pedal pulses noted.  Lymphatic:  No significant cervical or axillary adenopathy.  Psych:  Alert and cooperative. Normal mood and affect.  Musculoskeletal:  Normal gait. Head normocephalic, atraumatic. Symmetrical without gross deformities. 5/5 Upper and Lower extremity strength bilaterally.  Skin: Warm. Intact without significant lesions or rashes. No jaundice.  Neurologic:  Face symmetrical, tongue midline, Normal sensation to touch;  grossly normal neurologically.  Psych:  Alert and oriented x3, Alert and cooperative. Normal mood and affect.   LAB RESULTS: Recent Labs    02/26/21 0522 02/27/21 0047 02/27/21 1044  WBC 13.9* 22.1* 14.8*  HGB 12.1 12.5 10.7*  HCT 36.3 37.2 32.1*  PLT 257 287 219   BMET Recent Labs    02/26/21 0522 02/27/21 0047 02/27/21 1044  NA 139 138 136  K 4.4 3.3* 3.2*  CL 107 105 101  CO2 '27 26 26  '$ GLUCOSE 122* 100* 125*  BUN '12 15 11  '$ CREATININE 0.65 0.68 0.71  CALCIUM 8.7* 9.0 7.9*   LFT Recent Labs    02/27/21 0047  PROT 7.1  ALBUMIN 4.6  AST 18  ALT 20  ALKPHOS  53  BILITOT 1.1   PT/INR No results for input(s): LABPROT, INR in the last 72 hours.  STUDIES: CT ABDOMEN PELVIS W CONTRAST  Result Date: 02/27/2021 CLINICAL DATA:  Ovarian cyst removal, now presenting with abdominal pain, nausea and vomiting post discharge EXAM: CT ABDOMEN AND PELVIS WITH CONTRAST TECHNIQUE: Multidetector CT imaging of the abdomen and pelvis was performed using the standard protocol following bolus administration of intravenous contrast. CONTRAST:  41m OMNIPAQUE IOHEXOL 350 MG/ML SOLN COMPARISON:  Ultrasound 02/25/2021, CT 02/24/2021 FINDINGS: Lower chest: Lung bases are clear. Normal heart size. No pericardial effusion. Hepatobiliary: Heterogeneous enhancement of the liver with diffuse periportal edema, increasing in conspicuity from comparison prior study. More focal hypoattenuation along the falciform ligament, favor focal fatty infiltration. Prior cholecystectomy. Small amount of fluid seen tracking along the gallbladder fossa with additional free fluid along the inferior right lobe liver. Slight prominence of the biliary tree without visible calcified gallstone. Pancreas: No pancreatic ductal dilatation or surrounding inflammatory changes. Spleen: Normal in size. No concerning splenic lesions. Adrenals/Urinary Tract: Normal adrenals. Kidneys are normally located with symmetric enhancement and excretion. No suspicious renal lesion, urolithiasis or hydronephrosis. Urinary bladder is unremarkable for the degree of distention. Stomach/Bowel: Edematous thickening the distal thoracic esophagus. Stomach and duodenum unremarkable accounting for some adjacent free fluid in the region of the porta hepatis, lesser sac and right upper quadrant. No frank small bowel thickening or dilatation. Vascular/Lymphatic: No significant vascular findings are present. Reactive appearing adenopathy is seen in the mesentery and retroperitoneum. Some prominent right gonadal vein vasculature is noted.  Reproductive: Anteverted uterus. Subendometrial enhancement is a nonspecific finding and can be contrast timing related. There is some notable high attenuation material is seen at the level of the cervix, greater than that of the adjacent enteric contrast media. Finding is nonspecific though should correlate with pelvic exam as bleeding at the cervix could present with such an appearance. Postsurgical changes from a right ovarian laparoscopic cystectomy. The heterogenously attenuating structure along the right pelvic sidewall likely reflecting the right ovary with a smaller 2 cm focus of gas and fluid seen centrally (2/65), which could reflect a typical postoperative collection though remains somewhat indeterminate. Other: Moderate volume low-attenuation free fluid in the abdomen and pelvis measuring up to 15 Hounsfield units an average attenuation. Few foci of anti dependent free air seen in  the subdiaphragmatic spaces anterior to the right and left lobes liver. Postsurgical changes of the anterior abdominal wall including gas, soft tissue thickening and small amount of fluid both at the level of the umbilicus and low anterior lobe of right anterior pelvic wall (2/64) which could reflect sites of port placement for laparoscopic cystectomy. Diffuse edematous changes. Musculoskeletal: No acute osseous abnormality or suspicious osseous lesion. IMPRESSION: Postsurgical changes from a laparoscopic right ovarian cystectomy. Mild heterogeneity of what appears to be the right ovarian parenchyma with a small focal collection along or within the parenchyma containing small foci of gas and fluid which is nonspecific in the immediately postoperative setting. Suspect a typical postoperative collection such as hematoma or seroma. Infection would be difficult to discern given the recency of the procedure. Moderate volume free fluid in the abdomen and pelvis, wall much of this is low-attenuation (15 Hounsfield units or less) some  admixed hemorrhagic products may be present within this fluid. Expected postsurgical changes of the anterior abdominal wall at the presumed site of port placement. Small amount of hyperattenuation noted at the level of the cervix and vaginal canal, a tampon was noted previously, and may correlate with active menstruation though if there is clinical ambiguity, should consider pelvic exam. Diffuse heterogeneity of the liver with extensive periportal edema, increasing in conspicuity from the comparison prior study. Can be seen both with intrinsic liver disease is as well as in the setting of aggressive hydration. Correlate with patient history and LFTs where appropriate. These results were called by telephone at the time of interpretation on 02/27/2021 at 3:11 am to provider [facility] , who verbally acknowledged these results. Electronically Signed   By: Lovena Le M.D.   On: 02/27/2021 03:12   US PELVIC COMPLETE WITH TRANSVAGINAL  Result Date: 02/25/2021 CLINICAL DATA:  Pelvic pain for 3-4 days. Previous LEFT oophorectomy. LMP 02/20/2021. EXAM: TRANSABDOMINAL AND TRANSVAGINAL ULTRASOUND OF PELVIS TECHNIQUE: Both transabdominal and transvaginal ultrasound examinations of the pelvis were performed. Transabdominal technique was performed for global imaging of the pelvis including uterus, ovaries, adnexal regions, and pelvic cul-de-sac. It was necessary to proceed with endovaginal exam following the transabdominal exam to visualize the uterus and endometrium, ovaries, adnexal regions. COMPARISON:  Pelvic ultrasound on 02/24/2021, CT of the abdomen and pelvis on 02/24/2021 FINDINGS: Uterus Measurements: 7.7 x 3.4 x 5.2 centimeters = volume: 70.8 mL. No fibroids or other mass visualized. Endometrium Thickness: 2.4 millimeters.  No focal abnormality visualized. Right ovary Measurements: 6.0 x 4.4 x 5.7 centimeters = volume: 79.02 mL. Cystic area in the RIGHT ovary is 5.8 x 3.8 x 5.3 centimeters. A single internal  septation is present and stable in appearance. Left ovary Measurements: Surgically absent Other findings Trace free pelvic fluid surrounding the RIGHT ovary. Color Doppler noted in the RIGHT ovarian stroma. IMPRESSION: 1. Stable appearance of cyst within the RIGHT ovary. Recommend follow-up US in 3-6 months. Note: This recommendation does not apply to premenarchal patients or to those with increased risk (genetic, family history, elevated tumor markers or other high-risk factors) of ovarian cancer. Reference: Radiology 2019 Nov; 293(2):359-371. 2. No evidence for interval rupture or torsion. 3. Surgically absent LEFT ovary. 4. Normal appearance of the uterus. Electronically Signed   By: Nolon Nations M.D.   On: 02/25/2021 18:08      Impression / Plan:   Tiffany Guzman is a 25 y.o. y/o female with recent ovarian cystectomy 2 days ago, cholecystectomy on January 16, 2021, with nausea vomiting and history  of chronic intermittent abdominal pain, nausea vomiting previously attributed to marijuana use, with GI being consulted for above symptoms and periportal edema noted on imaging  Abdominal pain/nausea vomiting Patient's abdominal pain and nausea vomiting symptoms have been present even dating back to 2020, with the acute presentation being in the postsurgical setting.  She admits to marijuana use 5 days ago and has not had any since then.  Would recommend abstaining from any marijuana use given that her symptoms did get better in the past after that  She does report constipation, likely due to postsurgical status and need of pain medications.  If no bowel movement in the next 12 to 24 hours, would recommend MiraLAX daily, and hold for loose stools.  This may be contributing to her abdominal pain as well  Start decreasing and minimizing narcotics when possible  Given her baseline anxiety and depression, she may have component of functional symptoms as well given her intermittent history with them.   Patient will need close follow-up with PCP, GI clinic to work on these as well.  Continue anti-emetics as necessary  Periportal edema In regard to her periportal edema, her liver enzymes are completely normal.  She has no history of liver disease, or any history of heavy or daily alcohol use in the past.  Given her recurrent presentations to the ER, recent surgeries, where she has received IV fluids, aggressive hydration may be causing this picture as suggested by the radiologist.  Given elevated BNP, would recommend echo for further evaluation as well.  Once pt tolerating PO intake well, decrease and d/c IV fluids as possible.  Monitor I/O  No specific blood work for liver is needed at this point given normal liver enzymes and liver function.  However, she will need further follow-up as an outpatient as well, and possible repeat right upper quadrant ultrasound, and labs can be considered at that point  Thank you for involving me in the care of this patient.      LOS: 0 days   Virgel Manifold, MD  02/27/2021, 5:20 PM

## 2021-02-27 NOTE — ED Notes (Signed)
General surgery at bedside. 

## 2021-02-27 NOTE — H&P (Signed)
Obstetrics & Gynecology  H&P   Chief Complaint: Nausea, vomiting, abdominal pain  History of Present Illness: Patient is a 25 y.o. G1P1001 POD#2 laparoscopic right ovarian cystectomy presenting to the ER with intractable nausea and vomiting.  She is also status post robotic cholecystectomy 01/16/2021.  Admission labs were notable to a leukocytosis and otherwise stable labs and vitals.  CT A/P showing postoperative changes and some free fluid likely representing some irrigation vs cyst fluid from her recent cystectomy given radiodensity.  Her H&H is also stable from preoperative values.    The patient is a daily cannabis user and has had several ER presentation for cannabinoid hyperemesis syndrome in the past that responded to Haldol administration. She denies cannabis in the postoperative period and states she has not smoked in the past 3 days.  Following discharge the patient reports she was feeling well, then while sitting on the porch with family she began feeling nauseated.  The nausea preceded the onset of abdominal pain.    The patient was seen by medicine prior to discharge yesterday for sinus bradycardia.  She has been markedly hypertensive in the ER, without a prior history of hypertension.    Review of Systems:10 point review of systems  Past Medical History:  Patient Active Problem List   Diagnosis Date Noted   Bradycardia 02/26/2021   Right lower quadrant abdominal pain 02/25/2021   Right ovarian cyst 02/25/2021   Leukocytosis 02/25/2021   Status post ovarian cystectomy 02/25/2021   Cholecystitis, acute    Stomach ache    Status post cesarean delivery 11/14/2017   Dermoid cyst 10/03/2017   Dermoid cyst of left ovary 10/01/2017   Anxiety 06/24/2016    Past Surgical History:  Past Surgical History:  Procedure Laterality Date   CESAREAN SECTION N/A 11/14/2017   Procedure: CESAREAN SECTION;  Surgeon: Will Bonnet, MD;  Location: [facility] ORS;  Service: Obstetrics;   Laterality: N/A;   ESOPHAGOGASTRODUODENOSCOPY Left 02/17/2019   Procedure: ESOPHAGOGASTRODUODENOSCOPY (EGD);  Surgeon: Virgel Manifold, MD;  Location: [facility] ENDOSCOPY;  Service: Endoscopy;  Laterality: Left;   INSERTION OF NON VAGINAL CONTRACEPTIVE DEVICE     LAPAROSCOPIC OVARIAN CYSTECTOMY Right 02/23/2019   Procedure: LAPAROSCOPIC OVARIAN CYSTECTOMY;  Surgeon: Homero Fellers, MD;  Location: [facility] ORS;  Service: Gynecology;  Laterality: Right;   LAPAROSCOPY N/A 02/25/2021   Procedure: LAPAROSCOPY DIAGNOSTIC;  Surgeon: Will Bonnet, MD;  Location: [facility] ORS;  Service: Gynecology;  Laterality: N/A;   OOPHORECTOMY Left 10/03/2017   Procedure: OOPHORECTOMY;  Surgeon: Homero Fellers, MD;  Location: [facility] ORS;  Service: Gynecology;  Laterality: Left;   OVARIAN CYST REMOVAL Left 10/03/2017   Procedure: OVARIAN CYSTECTOMY;  Surgeon: Homero Fellers, MD;  Location: [facility] OR[facility];  Service: Gynecology;  Laterality: Left;   OVARIAN CYST REMOVAL Right 02/25/2021   Procedure: OVARIAN CYSTECTOMY;  Surgeon: Will Bonnet, MD;  Location: [facility] ORS;  Service: Gynecology;  Laterality: Right;    Gynecologic History:   Obstetric History: G1P1001  Family History:  Family History  Problem Relation Age of Onset   Hypertension Mother    Hypothyroidism Mother    Depression Mother    Diabetes Maternal Grandmother     Social History:  Social History   Socioeconomic History   Marital status: Single    Spouse name: Not on file   Number of children: Not on file   Years of education: Not on file   Highest education level: Not on file  Occupational History  Not on file  Tobacco Use   Smoking status: Never   Smokeless tobacco: Never  Vaping Use   Vaping Use: Every day   Start date: 09/20/2019   Substances: Nicotine, Flavoring  Substance and Sexual Activity   Alcohol use: No    Alcohol/week: 0.0 standard drinks   Drug use: Yes    Frequency: 7.0 times per week    Types:  Marijuana    Comment: has stopped for 10 days   Sexual activity: Yes    Partners: Male    Birth control/protection: None  Other Topics Concern   Not on file  Social History Narrative   Not on file   Social Determinants of Health   Financial Resource Strain: Not on file  Food Insecurity: Not on file  Transportation Needs: Not on file  Physical Activity: Not on file  Stress: Not on file  Social Connections: Not on file  Intimate Partner Violence: Not on file    Allergies:  Allergies  Allergen Reactions   Buspar [Buspirone]     Dystonic reaction - arms, legs, jaw "locked up"    Medications: Prior to Admission medications   Medication Sig Start Date End Date Taking? Authorizing Provider  acetaminophen (TYLENOL) 500 MG tablet Take 2 tablets (1,000 mg total) by mouth every 6 (six) hours as needed for mild pain. 01/16/21   Olean Ree, MD  doxycycline (VIBRAMYCIN) 100 MG capsule Take 1 capsule (100 mg total) by mouth 2 (two) times daily for 14 days. 02/26/21 03/12/21  Will Bonnet, MD  ibuprofen (ADVIL) 600 MG tablet Take 1 tablet (600 mg total) by mouth every 6 (six) hours. 02/26/21   Will Bonnet, MD  metroNIDAZOLE (FLAGYL) 500 MG tablet Take 1 tablet (500 mg total) by mouth every 12 (twelve) hours for 14 days. 02/26/21 03/12/21  Will Bonnet, MD  Multiple Vitamins-Minerals (WOMENS MULTIVITAMIN) TABS Take 1 tablet by mouth daily.    [provider]  oxyCODONE-acetaminophen (PERCOCET/ROXICET) 5-325 MG tablet Take 1 tablet by mouth every 6 (six) hours as needed for up to 7 days (breakthrough pain). 02/26/21 03/05/21  Will Bonnet, MD  promethazine (PHENERGAN) 12.5 MG tablet Take 1 tablet (12.5 mg total) by mouth every 6 (six) hours as needed. 02/24/21   Merlyn Lot, MD    Physical Exam Temp:  [97.7 F (36.5 C)-98.5 F (36.9 C)] 98.5 F (36.9 C) (08/09 0033) Pulse Rate:  [48-89] 62 (08/09 0630) Resp:  [18-20] 18 (08/09 0356) BP: (139-168)/(87-112)  147/100 (08/09 0630) SpO2:  [97 %-100 %] 97 % (08/09 0630) Weight:  [47.2 kg] 47.2 kg (08/09 0031)  General: NAD HEENT: normocephalic, anicteric Pulmonary: No increased work of breathing Cardiovascular: RRR, distal pulses 2+ Abdomen: NABS, right lower quadrant tenderness, no rebound no guarding Genitourinary: deferred Extremities: no edema, erythema, or tenderness Neurologic: Grossly intact Psychiatric: mood appropriate, affect full  Labs: Results for orders placed or performed during the hospital encounter of 02/27/21 (from the past 72 hour(s))  CBC     Status: Abnormal   Collection Time: 02/27/21 12:47 AM  Result Value Ref Range   WBC 22.1 (H) 4.0 - 10.5 K/uL   RBC 4.24 3.87 - 5.11 MIL/uL   Hemoglobin 12.5 12.0 - 15.0 g/dL   HCT 37.2 36.0 - 46.0 %   MCV 87.7 80.0 - 100.0 fL   MCH 29.5 26.0 - 34.0 pg   MCHC 33.6 30.0 - 36.0 g/dL   RDW 14.4 11.5 - 15.5 %   Platelets 287  150 - 400 K/uL   nRBC 0.0 0.0 - 0.2 %    Comment: Performed at [facility], [address] [place], [place] [id number]  Comprehensive metabolic panel     Status: Abnormal   Collection Time: 02/27/21 12:47 AM  Result Value Ref Range   Sodium 138 135 - 145 mmol/L   Potassium 3.3 (L) 3.5 - 5.1 mmol/L   Chloride 105 98 - 111 mmol/L   CO2 26 22 - 32 mmol/L   Glucose, Bld 100 (H) 70 - 99 mg/dL    Comment: Glucose reference range applies only to samples taken after fasting for at least 8 hours.   BUN 15 6 - 20 mg/dL   Creatinine, Ser 0.68 0.44 - 1.00 mg/dL   Calcium 9.0 8.9 - 10.3 mg/dL   Total Protein 7.1 6.5 - 8.1 g/dL   Albumin 4.6 3.5 - 5.0 g/dL   AST 18 15 - 41 U/L   ALT 20 0 - 44 U/L   Alkaline Phosphatase 53 38 - 126 U/L   Total Bilirubin 1.1 0.3 - 1.2 mg/dL   GFR, Estimated >60 >60 mL/min    Comment: (NOTE) Calculated using the CKD-EPI Creatinine Equation (2021)    Anion gap 7 5 - 15    Comment: Performed at [facility], [address] [place], [place] [address]     Imaging CT ABDOMEN PELVIS W CONTRAST  Result Date: 02/27/2021 CLINICAL DATA:  Ovarian cyst removal, now presenting with abdominal pain, nausea and vomiting post discharge EXAM: CT ABDOMEN AND PELVIS WITH CONTRAST TECHNIQUE: Multidetector CT imaging of the abdomen and pelvis was performed using the standard protocol following bolus administration of intravenous contrast. CONTRAST:  59m OMNIPAQUE IOHEXOL 350 MG/ML SOLN COMPARISON:  Ultrasound 02/25/2021, CT 02/24/2021 FINDINGS: Lower chest: Lung bases are clear. Normal heart size. No pericardial effusion. Hepatobiliary: Heterogeneous enhancement of the liver with diffuse periportal edema, increasing in conspicuity from comparison prior study. More focal hypoattenuation along the falciform ligament, favor focal fatty infiltration. Prior cholecystectomy. Small amount of fluid seen tracking along the gallbladder fossa with additional free fluid along the inferior right lobe liver. Slight prominence of the biliary tree without visible calcified gallstone. Pancreas: No pancreatic ductal dilatation or surrounding inflammatory changes. Spleen: Normal in size. No concerning splenic lesions. Adrenals/Urinary Tract: Normal adrenals. Kidneys are normally located with symmetric enhancement and excretion. No suspicious renal lesion, urolithiasis or hydronephrosis. Urinary bladder is unremarkable for the degree of distention. Stomach/Bowel: Edematous thickening the distal thoracic esophagus. Stomach and duodenum unremarkable accounting for some adjacent free fluid in the region of the porta hepatis, lesser sac and right upper quadrant. No frank small bowel thickening or dilatation. Vascular/Lymphatic: No significant vascular findings are present. Reactive appearing adenopathy is seen in the mesentery and retroperitoneum. Some prominent right gonadal vein vasculature is noted. Reproductive: Anteverted uterus. Subendometrial enhancement is a nonspecific finding and can be  contrast timing related. There is some notable high attenuation material is seen at the level of the cervix, greater than that of the adjacent enteric contrast media. Finding is nonspecific though should correlate with pelvic exam as bleeding at the cervix could present with such an appearance. Postsurgical changes from a right ovarian laparoscopic cystectomy. The heterogenously attenuating structure along the right pelvic sidewall likely reflecting the right ovary with a smaller 2 cm focus of gas and fluid seen centrally (2/65), which could reflect a typical postoperative collection though remains somewhat indeterminate. Other: Moderate volume low-attenuation free fluid in the abdomen  and pelvis measuring up to 15 Hounsfield units an average attenuation. Few foci of anti dependent free air seen in the subdiaphragmatic spaces anterior to the right and left lobes liver. Postsurgical changes of the anterior abdominal wall including gas, soft tissue thickening and small amount of fluid both at the level of the umbilicus and low anterior lobe of right anterior pelvic wall (2/64) which could reflect sites of port placement for laparoscopic cystectomy. Diffuse edematous changes. Musculoskeletal: No acute osseous abnormality or suspicious osseous lesion. IMPRESSION: Postsurgical changes from a laparoscopic right ovarian cystectomy. Mild heterogeneity of what appears to be the right ovarian parenchyma with a small focal collection along or within the parenchyma containing small foci of gas and fluid which is nonspecific in the immediately postoperative setting. Suspect a typical postoperative collection such as hematoma or seroma. Infection would be difficult to discern given the recency of the procedure. Moderate volume free fluid in the abdomen and pelvis, wall much of this is low-attenuation (15 Hounsfield units or less) some admixed hemorrhagic products may be present within this fluid. Expected postsurgical changes of  the anterior abdominal wall at the presumed site of port placement. Small amount of hyperattenuation noted at the level of the cervix and vaginal canal, a tampon was noted previously, and may correlate with active menstruation though if there is clinical ambiguity, should consider pelvic exam. Diffuse heterogeneity of the liver with extensive periportal edema, increasing in conspicuity from the comparison prior study. Can be seen both with intrinsic liver disease is as well as in the setting of aggressive hydration. Correlate with patient history and LFTs where appropriate. These results were called by telephone at the time of interpretation on 02/27/2021 at 3:11 am to provider [facility] , who verbally acknowledged these results. Electronically Signed   By: Lovena Le M.D.   On: 02/27/2021 03:12   CT ABDOMEN PELVIS W CONTRAST  Result Date: 02/24/2021 CLINICAL DATA:  Abdominal pain, acute, nonlocalized. Recent cholecystectomy. EXAM: CT ABDOMEN AND PELVIS WITH CONTRAST TECHNIQUE: Multidetector CT imaging of the abdomen and pelvis was performed using the standard protocol following bolus administration of intravenous contrast. CONTRAST:  56m OMNIPAQUE IOHEXOL 350 MG/ML SOLN COMPARISON:  02/13/2019 FINDINGS: Lower chest: Lung bases are clear. Hepatobiliary: Periportal edema noted in the liver. Prominent focal fat near the falciform ligament. Gallbladder has been removed. Small amount of fluid along the inferior right hepatic lobe. Liver is heterogeneous but this may be associated with the phase of contrast. There may be focal fat near the near the gallbladder fossa. Common bile duct measures 4 mm. Pancreas: Diffuse low-density without inflammatory changes or duct dilatation. Spleen: Normal in size without focal abnormality. Adrenals/Urinary Tract: Normal adrenal glands. Normal appearance of both kidneys without hydronephrosis. Normal appearance of the urinary bladder. Stomach/Bowel: Moderate amount of stool  in the rectum. There may be a normal appendix in the right pelvis on sequence 2, image 54 and sequence 5 image 32. However, the appendix is poorly characterized on this examination. No clear evidence for appendix inflammation. No evidence for small bowel dilatation. Normal appearance of the stomach. Vascular/Lymphatic: Normal appearance of the arterial structures. Main visceral arteries are patent. There is contrast within the iliac veins and IVC. No significant contrast within the hepatic veins. No abdominal or pelvic lymph node enlargement. Reproductive: Large low-density structure in the right hemipelvis measures 5.9 x 4.7 x 6.0 cm. Small amount of fluid along the right side of the pelvis. Cannot exclude a tubular fluid-filled structure in the  right adnexa. Normal appearance of the uterus. No gross abnormality to the left adnexa. Other: Patient has vaginal tampon. Trace fluid along the right side of the pelvis but no significant pelvic free fluid. There is trace fluid along the inferior right hepatic lobe on sequence 2 image 37. Negative for free air. Postsurgical changes in the periumbilical region along the anterior abdomen. Musculoskeletal: No acute bone abnormality. IMPRESSION: 1. 6.0 cm low-density structure in the right hemipelvis. This is suggestive for a right ovarian or adnexal cystic structure. Cannot exclude right hydrosalpinx. Recommend clinical correlation with regards to tubo-ovarian abscess. Recommend further characterization with a pelvic ultrasound. 2. Trace perihepatic fluid along the inferior right hepatic lobe. This may be associated with the right pelvic cystic structure but nonspecific. 3. Liver has a very heterogeneous appearance with periportal edema. Periportal edema could be secondary to recent cholecystectomy. Heterogeneity in liver probably represents a combination of the contrast phase and areas of geographic hepatic steatosis. In addition, pancreas diffusely hypodense but no  inflammatory changes. Again, this may be related to the phase of contrast rather than pancreatic inflammation. Electronically Signed   By: Markus Daft M.D.   On: 02/24/2021 16:10   US PELVIC COMPLETE WITH TRANSVAGINAL  Result Date: 02/25/2021 CLINICAL DATA:  Pelvic pain for 3-4 days. Previous LEFT oophorectomy. LMP 02/20/2021. EXAM: TRANSABDOMINAL AND TRANSVAGINAL ULTRASOUND OF PELVIS TECHNIQUE: Both transabdominal and transvaginal ultrasound examinations of the pelvis were performed. Transabdominal technique was performed for global imaging of the pelvis including uterus, ovaries, adnexal regions, and pelvic cul-de-sac. It was necessary to proceed with endovaginal exam following the transabdominal exam to visualize the uterus and endometrium, ovaries, adnexal regions. COMPARISON:  Pelvic ultrasound on 02/24/2021, CT of the abdomen and pelvis on 02/24/2021 FINDINGS: Uterus Measurements: 7.7 x 3.4 x 5.2 centimeters = volume: 70.8 mL. No fibroids or other mass visualized. Endometrium Thickness: 2.4 millimeters.  No focal abnormality visualized. Right ovary Measurements: 6.0 x 4.4 x 5.7 centimeters = volume: 79.02 mL. Cystic area in the RIGHT ovary is 5.8 x 3.8 x 5.3 centimeters. A single internal septation is present and stable in appearance. Left ovary Measurements: Surgically absent Other findings Trace free pelvic fluid surrounding the RIGHT ovary. Color Doppler noted in the RIGHT ovarian stroma. IMPRESSION: 1. Stable appearance of cyst within the RIGHT ovary. Recommend follow-up US in 3-6 months. Note: This recommendation does not apply to premenarchal patients or to those with increased risk (genetic, family history, elevated tumor markers or other high-risk factors) of ovarian cancer. Reference: Radiology 2019 Nov; 293(2):359-371. 2. No evidence for interval rupture or torsion. 3. Surgically absent LEFT ovary. 4. Normal appearance of the uterus. Electronically Signed   By: Nolon Nations M.D.   On:  02/25/2021 18:08   US PELVIC COMPLETE W TRANSVAGINAL AND TORSION R/O  Result Date: 02/24/2021 CLINICAL DATA:  Pelvic pain for 2-3 days EXAM: TRANSABDOMINAL AND TRANSVAGINAL ULTRASOUND OF PELVIS DOPPLER ULTRASOUND OF OVARIES TECHNIQUE: Both transabdominal and transvaginal ultrasound examinations of the pelvis were performed. Transabdominal technique was performed for global imaging of the pelvis including uterus, ovaries, adnexal regions, and pelvic cul-de-sac. It was necessary to proceed with endovaginal exam following the transabdominal exam to visualize the uterus, endometrium, ovaries, and adnexa. Color and duplex Doppler ultrasound was utilized to evaluate blood flow to the ovaries. COMPARISON:  None. FINDINGS: Uterus Measurements: 8.1 x 3.8 x 4.6 cm = volume: 74 mL. No fibroids or other mass visualized. Endometrium Thickness: 3 mm.  No focal abnormality visualized. Right ovary Measurements:  6.6 x 4.3 x 5.5 cm = volume: 81 mL. The right ovary is enlarged by a thinly septated cyst measuring 5.3 x 4.0 x 5.0 cm. Left ovary Surgically absent. Pulsed Doppler evaluation of the right ovary demonstrates normal low-resistance arterial and venous waveforms. Other findings No abnormal free fluid. IMPRESSION: 1. The right ovary is enlarged by a thinly septated cyst measuring 5.3 x 4.0 x 5.0 cm, almost certainly benign and functional in the reproductive age setting. Recommend follow-up US in 3-6 months to ensure stability or resolution. Note: This recommendation does not apply to premenarchal patients or to those with increased risk (genetic, family history, elevated tumor markers or other high-risk factors) of ovarian cancer. Reference: Radiology 2019 Nov; 293(2):359-371. 2. There is normal arterial and venous Doppler flow to the right ovary. Please note, however that intermittent or incomplete ovarian torsion cannot be strictly excluded by ultrasound in any enlarged ovary. 3. The left ovary is surgically absent. 4.  Unremarkable uterus. Electronically Signed   By: Eddie Candle M.D.   On: 02/24/2021 19:18    Assessment: 25 y.o. G1P1001 POD#2 with nausea, vomiting, abdominal pain  Plan:  1) Nausea/vomitting/abdominal pain - CT scan with non-specific postoperative findings.  No concern for bowl or bladder injury on imaging.  Free fluid felt to be related to either irrigant or cyst contents not completely aspirated from the upper abdomen following rupture.  Given prior history cannabinoid hyperemesis syndrome is in the differential.  Given postoperative state will admit and monitor.  If serial labs remain normal she has responded to Haldol in the past for CHS - UDS - Repeat CBC/CMP this morning - Dilaudid/Toradol for pain control  2) Leukocytosis - unclear etiology was 18.8K preoperatively on 8/7 and dropped to 13.9K on POD1, up to 22.1K on representation.  The patient is hypertensive not hypotensive, pulse is normal, and she is afebrile.  At present will attribute to postoperative leukocytosis.  Pan culture if becomes febrile.  She was discharge on doxycyline and flagyl postoperatively to cover for possible PID out of an abundance of caution but she did not have any operative findings of pyosalpinx and her GC/CT cultures were negative on 02/25/2021  3) Periportal edema - present on prior CT scan OSH 11/24/20, seen again 02/24/21 and now increasing in prominence 02/27/21 scan.  LFT remain normal.  Patient does appear cachectic.  Will obtain BNP to rule out cardiac etiology.  Visualized portion of heart normal in size.  Patient denies chest pain.    4) FEN - D5 1/2NS at 1107m/hr  5) DVT ppx - SCD  6) Disposition - pending improvement in symptoms   AMalachy Mood MD, FBreese [facility] 02/27/2021, 5:03 AM

## 2021-02-27 NOTE — Telephone Encounter (Signed)
Spoke with patient to review follow up information. She was discharged from hospital yesterday then readmitted again last night due to not feeling better and continued abdominal pain. She reports they wanted her to see cardiology due to persistent low heart rates in the 40's. Confirmed upcoming appointment with provider and discussed the importance of keeping this appointment. Advised that if she is not able to keep this appointment to please call us back to reschedule. She verbalized understanding with no further questions at this time.

## 2021-02-27 NOTE — ED Triage Notes (Signed)
Pt states she was seen here last night to have an ovarian cyst removed, pt had surgery and was d/c earlier today. Pt states she is back tonight with abd pain and n/v.

## 2021-02-27 NOTE — ED Provider Notes (Signed)
[facility] Emergency Department Provider Note  Time seen: 1:37 AM  I have reviewed the triage vital signs and the nursing notes.   HISTORY  Chief Complaint Post-op Problem   HPI Tiffany Guzman is a 25 y.o. female with a past medical history of anxiety, depression, gastric reflux, ovarian cysts, presents to the emergency department for abdominal pain nausea vomiting after an ovarian cyst surgery.  Per record review the patient had a right ovarian cystectomy 02/25/2021 by Dr. Glennon Mac.  No complications.  Patient states she went home this afternoon but then developed return of abdominal pain with nausea and vomiting.  Patient states abdominal pain never really went away.  Patient with frequent vomiting in the emergency department.  States diffuse abdominal pain but denies any focal area of pain.   Past Medical History:  Diagnosis Date   Acne    Anxiety    Attention deficit hyperactivity disorder (ADHD)    Chronic upper back pain    lower and upper back pain   Depression    Dyspnea    GERD (gastroesophageal reflux disease)    Gestational hypertension 11/14/2017   Migraine with aura    since elementary school    Patient Active Problem List   Diagnosis Date Noted   Bradycardia 02/26/2021   Right lower quadrant abdominal pain 02/25/2021   Right ovarian cyst 02/25/2021   Leukocytosis 02/25/2021   Status post ovarian cystectomy 02/25/2021   Cholecystitis, acute    Stomach ache    Status post cesarean delivery 11/14/2017   Dermoid cyst 10/03/2017   Dermoid cyst of left ovary 10/01/2017   Anxiety 06/24/2016    Past Surgical History:  Procedure Laterality Date   CESAREAN SECTION N/A 11/14/2017   Procedure: CESAREAN SECTION;  Surgeon: Will Bonnet, MD;  Location: [facility] ORS;  Service: Obstetrics;  Laterality: N/A;   ESOPHAGOGASTRODUODENOSCOPY Left 02/17/2019   Procedure: ESOPHAGOGASTRODUODENOSCOPY (EGD);  Surgeon: Virgel Manifold, MD;   Location: [facility] ENDOSCOPY;  Service: Endoscopy;  Laterality: Left;   INSERTION OF NON VAGINAL CONTRACEPTIVE DEVICE     LAPAROSCOPIC OVARIAN CYSTECTOMY Right 02/23/2019   Procedure: LAPAROSCOPIC OVARIAN CYSTECTOMY;  Surgeon: Homero Fellers, MD;  Location: [facility] ORS;  Service: Gynecology;  Laterality: Right;   LAPAROSCOPY N/A 02/25/2021   Procedure: LAPAROSCOPY DIAGNOSTIC;  Surgeon: Will Bonnet, MD;  Location: [facility] ORS;  Service: Gynecology;  Laterality: N/A;   OOPHORECTOMY Left 10/03/2017   Procedure: OOPHORECTOMY;  Surgeon: Homero Fellers, MD;  Location: [facility] ORS;  Service: Gynecology;  Laterality: Left;   OVARIAN CYST REMOVAL Left 10/03/2017   Procedure: OVARIAN CYSTECTOMY;  Surgeon: Homero Fellers, MD;  Location: [facility] ORS;  Service: Gynecology;  Laterality: Left;   OVARIAN CYST REMOVAL Right 02/25/2021   Procedure: OVARIAN CYSTECTOMY;  Surgeon: Will Bonnet, MD;  Location: [facility] ORS;  Service: Gynecology;  Laterality: Right;    Prior to Admission medications   Medication Sig Start Date End Date Taking? Authorizing Provider  acetaminophen (TYLENOL) 500 MG tablet Take 2 tablets (1,000 mg total) by mouth every 6 (six) hours as needed for mild pain. 01/16/21   Olean Ree, MD  doxycycline (VIBRAMYCIN) 100 MG capsule Take 1 capsule (100 mg total) by mouth 2 (two) times daily for 14 days. 02/26/21 03/12/21  Will Bonnet, MD  ibuprofen (ADVIL) 600 MG tablet Take 1 tablet (600 mg total) by mouth every 6 (six) hours. 02/26/21   Will Bonnet, MD  metroNIDAZOLE (FLAGYL) 500 MG tablet Take 1  tablet (500 mg total) by mouth every 12 (twelve) hours for 14 days. 02/26/21 03/12/21  Will Bonnet, MD  Multiple Vitamins-Minerals (WOMENS MULTIVITAMIN) TABS Take 1 tablet by mouth daily.    [provider]  oxyCODONE-acetaminophen (PERCOCET/ROXICET) 5-325 MG tablet Take 1 tablet by mouth every 6 (six) hours as needed for up to 7 days (breakthrough pain). 02/26/21 03/05/21   Will Bonnet, MD  promethazine (PHENERGAN) 12.5 MG tablet Take 1 tablet (12.5 mg total) by mouth every 6 (six) hours as needed. 02/24/21   Merlyn Lot, MD    Allergies  Allergen Reactions   Buspar [Buspirone]     Dystonic reaction - arms, legs, jaw "locked up"    Family History  Problem Relation Age of Onset   Hypertension Mother    Hypothyroidism Mother    Depression Mother    Diabetes Maternal Grandmother     Social History Social History   Tobacco Use   Smoking status: Never   Smokeless tobacco: Never  Vaping Use   Vaping Use: Every day   Start date: 09/20/2019   Substances: Nicotine, Flavoring  Substance Use Topics   Alcohol use: No    Alcohol/week: 0.0 standard drinks   Drug use: Yes    Frequency: 7.0 times per week    Types: Marijuana    Comment: has stopped for 10 days    Review of Systems Constitutional: Negative for fever. Cardiovascular: Negative for chest pain. Respiratory: Negative for shortness of breath. Gastrointestinal: Moderate/severe diffuse abdominal pain.  Positive for nausea and vomiting with frequent retching. Genitourinary: Negative for urinary compaints Musculoskeletal: Negative for musculoskeletal complaints Neurological: Negative for headache All other ROS negative  ____________________________________________   PHYSICAL EXAM:  VITAL SIGNS: ED Triage Vitals  Enc Vitals Group     BP 02/27/21 0033 (!) 168/109     Pulse Rate 02/27/21 0033 71     Resp 02/27/21 0033 20     Temp 02/27/21 0033 98.5 F (36.9 C)     Temp Source 02/27/21 0033 Oral     SpO2 02/27/21 0033 100 %     Weight 02/27/21 0031 104 lb (47.2 kg)     Height 02/27/21 0031 '5\' 4"'$  (1.626 m)     Head Circumference --      Peak Flow --      Pain Score 02/27/21 0031 10     Pain Loc --      Pain Edu? --      Excl. in [place]? --    Constitutional: Alert and oriented. Well appearing and in no distress. Eyes: Normal exam ENT      Head: Normocephalic and  atraumatic.      Mouth/Throat: Mucous membranes are moist. Cardiovascular: Normal rate, regular rhythm.  Respiratory: Normal respiratory effort without tachypnea nor retractions. Breath sounds are clear  Gastrointestinal: Soft and nontender. No distention.   Musculoskeletal: Nontender with normal range of motion in all extremities.  Neurologic:  Normal speech and language. No gross focal neurologic deficits Skin:  Skin is warm, dry and intact.  Psychiatric: Mood and affect are normal.   ____________________________________________   RADIOLOGY   IMPRESSION:  Postsurgical changes from a laparoscopic right ovarian cystectomy.  Mild heterogeneity of what appears to be the right ovarian  parenchyma with a small focal collection along or within the  parenchyma containing small foci of gas and fluid which is  nonspecific in the immediately postoperative setting. Suspect a  typical postoperative collection such as hematoma or seroma.  Infection would be difficult to discern given the recency of the  procedure.   Moderate volume free fluid in the abdomen and pelvis, wall much of  this is low-attenuation (15 Hounsfield units or less) some admixed  hemorrhagic products may be present within this fluid.   Expected postsurgical changes of the anterior abdominal wall at the  presumed site of port placement.   Small amount of hyperattenuation noted at the level of the cervix  and vaginal canal, a tampon was noted previously, and may correlate  with active menstruation though if there is clinical ambiguity,  should consider pelvic exam.   Diffuse heterogeneity of the liver with extensive periportal edema,  increasing in conspicuity from the comparison prior study. Can be  seen both with intrinsic liver disease is as well as in the setting  of aggressive hydration. Correlate with patient history and LFTs  where appropriate.   ____________________________________________   INITIAL  IMPRESSION / ASSESSMENT AND PLAN / ED COURSE  Pertinent labs & imaging results that were available during my care of the patient were reviewed by me and considered in my medical decision making (see chart for details).   Patient presents emergency department for abdominal pain nausea and vomiting.  Frequent vomiting/retching in the emergency department.  We will treat pain, nausea and continue to closely monitor.  We will check labs obtain a CT scan of the abdomen/pelvis.  Patient agreeable to plan of care.  ET scan shows postsurgical changes with small fluid collection around the right ovary.  Patient has moderate volume free fluid within the abdomen and pelvis cannot rule out hemorrhagic products.  Given this I spoke to Dr. Georgianne Fick of OB/GYN.  Patient continues to have pain she states 9/10 abdominal pain and continues to be nauseated with frequent episodes of vomiting.  Given the patient's continued abdominal pain and CT findings we will admit to OB/GYN for further work-up and treatment.  Patient agreeable to plan of care.  Tiffany Guzman was evaluated in Emergency Department on 02/27/2021 for the symptoms described in the history of present illness. She was evaluated in the context of the global COVID-19 pandemic, which necessitated consideration that the patient might be at risk for infection with the SARS-CoV-2 virus that causes COVID-19. Institutional protocols and algorithms that pertain to the evaluation of patients at risk for COVID-19 are in a state of rapid change based on information released by regulatory bodies including the CDC and federal and state organizations. These policies and algorithms were followed during the patient's care in the ED.  ____________________________________________   FINAL CLINICAL IMPRESSION(S) / ED DIAGNOSES  Abdominal pain Nausea vomiting   Harvest Dark, MD 02/27/21 [phone]

## 2021-02-28 DIAGNOSIS — F4010 Social phobia, unspecified: Secondary | ICD-10-CM | POA: Diagnosis not present

## 2021-02-28 DIAGNOSIS — F509 Eating disorder, unspecified: Secondary | ICD-10-CM

## 2021-02-28 DIAGNOSIS — D72829 Elevated white blood cell count, unspecified: Secondary | ICD-10-CM | POA: Diagnosis not present

## 2021-02-28 DIAGNOSIS — R112 Nausea with vomiting, unspecified: Secondary | ICD-10-CM | POA: Diagnosis present

## 2021-02-28 DIAGNOSIS — R609 Edema, unspecified: Secondary | ICD-10-CM | POA: Diagnosis not present

## 2021-02-28 DIAGNOSIS — R001 Bradycardia, unspecified: Secondary | ICD-10-CM | POA: Diagnosis not present

## 2021-02-28 DIAGNOSIS — F32A Depression, unspecified: Secondary | ICD-10-CM | POA: Diagnosis not present

## 2021-02-28 DIAGNOSIS — R109 Unspecified abdominal pain: Secondary | ICD-10-CM | POA: Diagnosis not present

## 2021-02-28 DIAGNOSIS — R1084 Generalized abdominal pain: Secondary | ICD-10-CM | POA: Diagnosis not present

## 2021-02-28 LAB — SURGICAL PATHOLOGY

## 2021-02-28 MED ORDER — KCL IN DEXTROSE-NACL 40-5-0.45 MEQ/L-%-% IV SOLN
INTRAVENOUS | Status: DC
  Filled 2021-02-28 (×5): qty 1000
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
## 2021-02-28 NOTE — Progress Notes (Signed)
Daily Benign Gynecology Progress Note Shadi Lon Drolet  [id number]  HD#2/POD#3  Subjective:  Overnight Events: No acute events Complaints: still mildly nauseated, has not had an episode of emesis since yesterday morning. Was just advanced to clears today.  She denies: fevers, chills, chest pain, trouble breathing, nausea, severe abdominal pain.  She has tolerated: a small amount of clear liquid She reports her pain is well controlled .   She is ambulating and is voiding.  She believes her eating issue is related to anxiety.  Objective:  Most recent vitals Temp: 98 F (36.7 C)  BP: (!) 124/93  Pulse Rate: (!) 55  Resp: 20  SpO2: 99 %   Vitals Range over 24 hours Temp  Avg: 98.1 F (36.7 C)  Min: 97.9 F (36.6 C)  Max: 98.5 F (36.9 C) BP  Min: 120/74  Max: 138/85 Pulse  Avg: 51.7  Min: 44  Max: 60 SpO2  Avg: 98 %  Min: 96 %  Max: 99 %   Physical Exam General: alert, thin, and in no distress Heart: no murmurs, bradycardic rate, rhythm regular Lungs: clear to auscultation, no wheezes, rales or rhonchi, symmetric air entry Abdomen: s/nt/nd, +BS Incision: clean, dry, intact Extremities: no redness or tenderness in the calves or thighs, no edema  AM Labs Lab Results  Component Value Date   WBC 14.8 (H) 02/27/2021   HGB 10.7 (L) 02/27/2021   HCT 32.1 (L) 02/27/2021   PLT 219 02/27/2021   NA 136 02/27/2021   K 3.2 (L) 02/27/2021   CREATININE 0.71 02/27/2021   BUN 11 02/27/2021     Assessment:  Tiffany Guzman is a 25 y.o. female HD#2 admitted with post-op nausea and vomiting, though actual source is unclear    Plan:  Pain Control:  continue current pain medication Pulmonary:  IS GI: continue current antiemetics Nutrition:  advance diet as tolerated IVF: correct hypokalemia with D5 1/2NS with KCO 40 mEq at 125 mL/hr DVT Prophylaxis:  SCDs Activity: as tolerated Anxiety: psychiatry consult. This is a long-standing problem. She is ok with psych consult.   Anticipated Discharge: likely tomorrow.  Prentice Docker, MD  02/28/2021 1:49 PM

## 2021-03-01 MED ORDER — ONDANSETRON 4 MG PO TBDP
4.0000 mg | ORAL_TABLET | Freq: Three times a day (TID) | ORAL | 0 refills | Status: DC | PRN
Start: 2021-03-01 — End: 2021-03-15

## 2021-03-01 MED ORDER — PANTOPRAZOLE SODIUM 40 MG PO TBEC: 40.0000 mg | DELAYED_RELEASE_TABLET | Freq: Every day | ORAL | 1 refills | Status: DC

## 2021-03-01 MED ORDER — HYDROMORPHONE HCL 2 MG PO TABS: 2.0000 mg | ORAL_TABLET | Freq: Four times a day (QID) | ORAL | 0 refills | Status: DC | PRN

## 2021-03-01 NOTE — Discharge Summary (Signed)
DC Summary Discharge Summary   Patient ID: Tiffany Guzman [id number] 25 y.o. July 08, 1996  Admit date: 02/27/2021  Discharge date: 03/01/2021  Principal Diagnoses:  1) postoperative nausea and vomiting 2) atypical eating disorder 3) anxiety  Secondary Diagnoses:  Periportal edema, bradycardia  Procedures performed during the hospitalization:  1) treatment of nausea 2) hospitalist consult 3) behavioral health consult  HPI: Patient is a 25 y.o. G1P1001 POD#2 laparoscopic right ovarian cystectomy presenting to the ER with intractable nausea and vomiting.  She is also status post robotic cholecystectomy 01/16/2021.  Admission labs were notable to a leukocytosis and otherwise stable labs and vitals.  CT A/P showing postoperative changes and some free fluid likely representing some irrigation vs cyst fluid from her recent cystectomy given radiodensity.  Her H&H is also stable from preoperative values.     The patient is a daily cannabis user and has had several ER presentation for cannabinoid hyperemesis syndrome in the past that responded to Haldol administration. She denies cannabis in the postoperative period and states she has not smoked in the past 3 days.  Following discharge the patient reports she was feeling well, then while sitting on the porch with family she began feeling nauseated.  The nausea preceded the onset of abdominal pain.     The patient was seen by medicine prior to discharge yesterday for sinus bradycardia.  She has been markedly hypertensive in the ER, without a prior history of hypertension.    Past Medical History:  Diagnosis Date   Acne    Anxiety    Attention deficit hyperactivity disorder (ADHD)    Chronic upper back pain    lower and upper back pain   Depression    Dyspnea    GERD (gastroesophageal reflux disease)    Gestational hypertension 11/14/2017   Migraine with aura    since elementary school   Postoperative nausea and vomiting 02/27/2021     Past Surgical History:  Procedure Laterality Date   CESAREAN SECTION N/A 11/14/2017   Procedure: CESAREAN SECTION;  Surgeon: Will Bonnet, MD;  Location: [facility] ORS;  Service: Obstetrics;  Laterality: N/A;   ESOPHAGOGASTRODUODENOSCOPY Left 02/17/2019   Procedure: ESOPHAGOGASTRODUODENOSCOPY (EGD);  Surgeon: Virgel Manifold, MD;  Location: [facility] ENDOSCOPY;  Service: Endoscopy;  Laterality: Left;   INSERTION OF NON VAGINAL CONTRACEPTIVE DEVICE     LAPAROSCOPIC OVARIAN CYSTECTOMY Right 02/23/2019   Procedure: LAPAROSCOPIC OVARIAN CYSTECTOMY;  Surgeon: Homero Fellers, MD;  Location: [facility] ORS;  Service: Gynecology;  Laterality: Right;   LAPAROSCOPY N/A 02/25/2021   Procedure: LAPAROSCOPY DIAGNOSTIC;  Surgeon: Will Bonnet, MD;  Location: [facility] ORS;  Service: Gynecology;  Laterality: N/A;   OOPHORECTOMY Left 10/03/2017   Procedure: OOPHORECTOMY;  Surgeon: Homero Fellers, MD;  Location: [facility] ORS;  Service: Gynecology;  Laterality: Left;   OVARIAN CYST REMOVAL Left 10/03/2017   Procedure: OVARIAN CYSTECTOMY;  Surgeon: Homero Fellers, MD;  Location: [facility] ORS;  Service: Gynecology;  Laterality: Left;   OVARIAN CYST REMOVAL Right 02/25/2021   Procedure: OVARIAN CYSTECTOMY;  Surgeon: Will Bonnet, MD;  Location: [facility] ORS;  Service: Gynecology;  Laterality: Right;    Allergies  Allergen Reactions   Buspar [Buspirone]     Dystonic reaction - arms, legs, jaw "locked up"    Social History   Tobacco Use   Smoking status: Never   Smokeless tobacco: Never  Vaping Use   Vaping Use: Every day   Start date: 09/20/2019   Substances: Nicotine, Flavoring  Substance  Use Topics   Alcohol use: No    Alcohol/week: 0.0 standard drinks   Drug use: Yes    Frequency: 7.0 times per week    Types: Marijuana    Comment: has stopped for 10 days    Family History  Problem Relation Age of Onset   Hypertension Mother    Hypothyroidism Mother    Depression Mother     Diabetes Maternal [facility] Course:  She was admitted, given IV fluid and anti-emetics.  She had a hospitalist consult and behavioral health consult. She had her diet slowly advanced without difficulty. She had a slight improvement in her bradycardia. Her vital signs were otherwise normal.  She remained afebrile and by HD#3 she desired to go home and was meeting discharge crieteria.    Discharge Exam: BP 111/82 (BP Location: Left Arm)   Pulse (!) 55   Temp 98.5 F (36.9 C) (Oral)   Resp 18   Ht '5\' 4"'$  (1.626 m)   Wt 47.2 kg   LMP 02/17/2021   SpO2 99%   BMI 17.85 kg/m  Physical Exam Constitutional:      General: She is not in acute distress.    Appearance: Normal appearance. She is well-developed.  HENT:     Head: Normocephalic and atraumatic.  Eyes:     General: No scleral icterus.    Conjunctiva/sclera: Conjunctivae normal.  Cardiovascular:     Rate and Rhythm: Normal rate and regular rhythm.     Heart sounds: No murmur heard.   No friction rub. No gallop.  Pulmonary:     Effort: Pulmonary effort is normal. No respiratory distress.     Breath sounds: Normal breath sounds. No wheezing or rales.  Abdominal:     General: Bowel sounds are normal. There is no distension.     Palpations: Abdomen is soft. There is no mass.     Tenderness: There is no abdominal tenderness. There is no guarding or rebound.     Comments: Incisions: without erythema, induration, warmth, and tenderness. They are clean, dry, and intact.     Musculoskeletal:        General: Normal range of motion.     Cervical back: Normal range of motion and neck supple.  Neurological:     General: No focal deficit present.     Mental Status: She is alert and oriented to person, place, and time.     Cranial Nerves: No cranial nerve deficit.  Skin:    General: Skin is warm and dry.     Findings: No erythema.  Psychiatric:        Mood and Affect: Mood normal.        Behavior: Behavior normal.         Judgment: Judgment normal.     Condition at Discharge: Stable  Complications affecting treatment: None  Discharge Medications:  Allergies as of 03/01/2021       Reactions   Buspar [buspirone]    Dystonic reaction - arms, legs, jaw "locked up"        Medication List     STOP taking these medications    doxycycline 100 MG capsule Commonly known as: VIBRAMYCIN   metroNIDAZOLE 500 MG tablet Commonly known as: FLAGYL   oxyCODONE-acetaminophen 5-325 MG tablet Commonly known as: PERCOCET/ROXICET       TAKE these medications    acetaminophen 500 MG tablet Commonly known as: TYLENOL Take 2 tablets (1,000 mg total) by mouth every 6 (  six) hours as needed for mild pain.   HYDROmorphone 2 MG tablet Commonly known as: DILAUDID Take 1 tablet (2 mg total) by mouth every 6 (six) hours as needed for up to 5 days (breakthrough pain).   ibuprofen 600 MG tablet Commonly known as: ADVIL Take 1 tablet (600 mg total) by mouth every 6 (six) hours.   ondansetron 4 MG disintegrating tablet Commonly known as: Zofran ODT Take 1 tablet (4 mg total) by mouth every 8 (eight) hours as needed for nausea or vomiting.   pantoprazole 40 MG tablet Commonly known as: Protonix Take 1 tablet (40 mg total) by mouth daily.   promethazine 12.5 MG tablet Commonly known as: PHENERGAN Take 1 tablet (12.5 mg total) by mouth every 6 (six) hours as needed.   Womens Multivitamin Tabs Take 1 tablet by mouth daily.         Follow-up arrangements:  03/08/2021 at 1:15PM: Prentice Docker, MD.  Postop follow up 03/13/2021 at 1:20PM: [name], hospital follow up for bradycardia 03/21/2021 at 2:00PM: Lars Mage, New Patient for Bradycardia Any follow up appointments per [place]  See AVS for more details   Discharge Disposition: Discharge disposition: 01-Home or Self Care    Signed: Prentice Docker, MD  03/01/2021 8:25 AM

## 2021-03-01 NOTE — Progress Notes (Signed)
Pt discharged home. Discharge instructions, prescriptions, and follow up appointments given to and reviewed with pt. Pt verbalized understanding. Escorted by axillary.

## 2021-03-02 ENCOUNTER — Telehealth: Payer: Self-pay

## 2021-03-02 ENCOUNTER — Encounter: Payer: Self-pay | Admitting: Emergency Medicine

## 2021-03-02 ENCOUNTER — Other Ambulatory Visit: Payer: Self-pay

## 2021-03-02 ENCOUNTER — Emergency Department
Admission: EM | Admit: 2021-03-02 | Discharge: 2021-03-04 | Disposition: A | Discharge status: Home / Self Care | Payer: Medicaid Other | Source: Home / Self Care | Attending: Emergency Medicine | Admitting: Emergency Medicine

## 2021-03-02 DIAGNOSIS — I10 Essential (primary) hypertension: Secondary | ICD-10-CM | POA: Diagnosis not present

## 2021-03-02 DIAGNOSIS — K219 Gastro-esophageal reflux disease without esophagitis: Secondary | ICD-10-CM | POA: Insufficient documentation

## 2021-03-02 DIAGNOSIS — R52 Pain, unspecified: Secondary | ICD-10-CM | POA: Diagnosis not present

## 2021-03-02 DIAGNOSIS — R0902 Hypoxemia: Secondary | ICD-10-CM | POA: Diagnosis not present

## 2021-03-02 DIAGNOSIS — R1084 Generalized abdominal pain: Secondary | ICD-10-CM | POA: Diagnosis not present

## 2021-03-02 DIAGNOSIS — R1115 Cyclical vomiting syndrome unrelated to migraine: Secondary | ICD-10-CM | POA: Diagnosis not present

## 2021-03-02 DIAGNOSIS — R112 Nausea with vomiting, unspecified: Secondary | ICD-10-CM | POA: Diagnosis not present

## 2021-03-02 DIAGNOSIS — R1111 Vomiting without nausea: Secondary | ICD-10-CM | POA: Diagnosis not present

## 2021-03-02 DIAGNOSIS — R1010 Upper abdominal pain, unspecified: Secondary | ICD-10-CM | POA: Insufficient documentation

## 2021-03-02 DIAGNOSIS — G43A0 Cyclical vomiting, not intractable: Secondary | ICD-10-CM | POA: Insufficient documentation

## 2021-03-02 DIAGNOSIS — R109 Unspecified abdominal pain: Secondary | ICD-10-CM | POA: Diagnosis not present

## 2021-03-02 LAB — CBC
HCT: 41.2 % (ref 36.0–46.0)
Hemoglobin: 14.4 g/dL (ref 12.0–15.0)
MCH: 28.7 pg (ref 26.0–34.0)
MCHC: 35 g/dL (ref 30.0–36.0)
MCV: 82.1 fL (ref 80.0–100.0)
Platelets: 340 10*3/uL (ref 150–400)
RBC: 5.02 MIL/uL (ref 3.87–5.11)
RDW: 13.2 % (ref 11.5–15.5)
WBC: 15.2 10*3/uL — ABNORMAL HIGH (ref 4.0–10.5)
nRBC: 0 % (ref 0.0–0.2)

## 2021-03-02 LAB — COMPREHENSIVE METABOLIC PANEL
ALT: 28 U/L (ref 0–44)
AST: 31 U/L (ref 15–41)
Albumin: 5.1 g/dL — ABNORMAL HIGH (ref 3.5–5.0)
Alkaline Phosphatase: 55 U/L (ref 38–126)
Anion gap: 15 (ref 5–15)
BUN: 11 mg/dL (ref 6–20)
CO2: 24 mmol/L (ref 22–32)
Calcium: 9.7 mg/dL (ref 8.9–10.3)
Chloride: 100 mmol/L (ref 98–111)
Creatinine, Ser: 0.8 mg/dL (ref 0.44–1.00)
GFR, Estimated: >60 mL/min (ref >60)
Glucose, Bld: 109 mg/dL — ABNORMAL HIGH (ref 70–99)
Potassium: 3.6 mmol/L (ref 3.5–5.1)
Sodium: 139 mmol/L (ref 135–145)
Total Bilirubin: 1 mg/dL (ref 0.3–1.2)
Total Protein: 7.8 g/dL (ref 6.5–8.1)

## 2021-03-02 LAB — LIPASE, BLOOD: Lipase: 37 U/L (ref 11–51)

## 2021-03-02 LAB — URINALYSIS, COMPLETE (UACMP) WITH MICROSCOPIC
Bacteria, UA: NONE SEEN
Bilirubin Urine: NEGATIVE
Glucose, UA: NEGATIVE mg/dL
Ketones, ur: 80 mg/dL — AB
Nitrite: NEGATIVE
Protein, ur: NEGATIVE mg/dL
Specific Gravity, Urine: 1.01 (ref 1.005–1.030)
pH: 8 (ref 5.0–8.0)

## 2021-03-02 MED ORDER — DROPERIDOL 2.5 MG/ML IJ SOLN
2.5000 mg | Freq: Once | INTRAMUSCULAR | Status: AC
  Administered 2021-03-02: 2.5 mg via INTRAVENOUS
  Filled 2021-03-02: qty 2

## 2021-03-02 MED ORDER — PROMETHAZINE HCL 25 MG RE SUPP: 25.0000 mg | Freq: Four times a day (QID) | RECTAL | 0 refills | Status: DC | PRN

## 2021-03-02 MED ORDER — SODIUM CHLORIDE 0.9 % IV BOLUS
1000.0000 mL | Freq: Once | INTRAVENOUS | Status: AC
  Administered 2021-03-02: 1000 mL via INTRAVENOUS

## 2021-03-02 MED ORDER — METOCLOPRAMIDE HCL 5 MG/ML IJ SOLN
10.0000 mg | Freq: Once | INTRAMUSCULAR | Status: AC
  Administered 2021-03-02: 10 mg via INTRAVENOUS
  Filled 2021-03-02: qty 2

## 2021-03-02 NOTE — Telephone Encounter (Signed)
Transition Care Management Follow-up Telephone Call Date of discharge and from where: 03/01/2021-[facility] How have you been since you were released from the hospital? Patient stated she is doing fine. Any questions or concerns? No  Items Reviewed: Did the pt receive and understand the discharge instructions provided? Yes  Medications obtained and verified? Yes  Other? No  Any new allergies since your discharge? No  Dietary orders reviewed? N/A Do you have support at home? Yes   Home Care and Equipment/Supplies: Were home health services ordered? not applicable If so, what is the name of the agency? N/A  Has the agency set up a time to come to the patient's home? not applicable Were any new equipment or medical supplies ordered?  No What is the name of the medical supply agency? N/A Were you able to get the supplies/equipment? not applicable Do you have any questions related to the use of the equipment or supplies? No  Functional Questionnaire: (I = Independent and D = Dependent) ADLs: I  Bathing/Dressing- I  Meal Prep- I  Eating- I  Maintaining continence- I  Transferring/Ambulation- I  Managing Meds- I  Follow up appointments reviewed:  PCP Hospital f/u appt confirmed? Yes  Scheduled to see Hinda Glatter on 03/13/2021 @ 1:20 PM. [facility] f/u appt confirmed? Yes  Scheduled to see Dr. Glennon Mac on 03/08/2021 @ 1:15 pm. Are transportation arrangements needed? No  If their condition worsens, is the pt aware to call PCP or go to the Emergency Dept.? Yes Was the patient provided with contact information for the PCP's office or ED? Yes Was to pt encouraged to call back with questions or concerns? Yes

## 2021-03-02 NOTE — ED Triage Notes (Signed)
Pt to ED via POV, pt states that she had surgery 4 days ago to have abdominal cyst removed. Pt reports that she has been vomiting for the last hours. Pt was given 4 mg of Zofran by EMS PTA without relief. Pt is having pain in her abdomen as well. Pt appears pale and has increased HR and RR.

## 2021-03-02 NOTE — ED Provider Notes (Signed)
[facility] Emergency Department Provider Note  Time seen: 7:09 PM  I have reviewed the triage vital signs and the nursing notes.   HISTORY  Chief Complaint Abdominal Pain   HPI Tiffany Guzman is a 25 y.o. female with a past medical history of depression, anxiety, recent ovarian cystectomy, cyclical vomiting or possible cannabinoid hyperemesis, presents to the emergency department for nausea vomiting abdominal discomfort.  Patient was seen by myself in the emergency department several days ago for the same, ultimately was admitted to OB/GYN that she had a recent cystectomy and continued to have abdominal pain nausea vomiting.  Patient has had a negative work-up besides the persistent nausea and vomiting.  Patient denies any recent cannabinoid use.  Upon arrival patient states again upper abdominal pain and persistent nausea vomiting.  No fever.  Largely negative review of systems otherwise.   Past Medical History:  Diagnosis Date   Acne    Anxiety    Attention deficit hyperactivity disorder (ADHD)    Chronic upper back pain    lower and upper back pain   Depression    Dyspnea    GERD (gastroesophageal reflux disease)    Gestational hypertension 11/14/2017   Migraine with aura    since elementary school   Postoperative nausea and vomiting 02/27/2021    Patient Active Problem List   Diagnosis Date Noted   Nausea and vomiting in adult 02/28/2021   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   Postoperative nausea and vomiting 02/27/2021   Bradycardia 02/26/2021   Right lower quadrant abdominal pain 02/25/2021   Right ovarian cyst 02/25/2021   Leukocytosis 02/25/2021   Status post ovarian cystectomy 02/25/2021   Cholecystitis, acute    Stomach ache    Status post cesarean delivery 11/14/2017   Dermoid cyst 10/03/2017   Dermoid cyst of left ovary 10/01/2017   Anxiety 06/24/2016    Past Surgical History:  Procedure Laterality Date   CESAREAN SECTION N/A  11/14/2017   Procedure: CESAREAN SECTION;  Surgeon: Will Bonnet, MD;  Location: [facility] ORS;  Service: Obstetrics;  Laterality: N/A;   ESOPHAGOGASTRODUODENOSCOPY Left 02/17/2019   Procedure: ESOPHAGOGASTRODUODENOSCOPY (EGD);  Surgeon: Virgel Manifold, MD;  Location: [facility] ENDOSCOPY;  Service: Endoscopy;  Laterality: Left;   INSERTION OF NON VAGINAL CONTRACEPTIVE DEVICE     LAPAROSCOPIC OVARIAN CYSTECTOMY Right 02/23/2019   Procedure: LAPAROSCOPIC OVARIAN CYSTECTOMY;  Surgeon: Homero Fellers, MD;  Location: [facility] ORS;  Service: Gynecology;  Laterality: Right;   LAPAROSCOPY N/A 02/25/2021   Procedure: LAPAROSCOPY DIAGNOSTIC;  Surgeon: Will Bonnet, MD;  Location: [facility] ORS;  Service: Gynecology;  Laterality: N/A;   OOPHORECTOMY Left 10/03/2017   Procedure: OOPHORECTOMY;  Surgeon: Homero Fellers, MD;  Location: [facility] ORS;  Service: Gynecology;  Laterality: Left;   OVARIAN CYST REMOVAL Left 10/03/2017   Procedure: OVARIAN CYSTECTOMY;  Surgeon: Homero Fellers, MD;  Location: [facility] ORS;  Service: Gynecology;  Laterality: Left;   OVARIAN CYST REMOVAL Right 02/25/2021   Procedure: OVARIAN CYSTECTOMY;  Surgeon: Will Bonnet, MD;  Location: [facility] ORS;  Service: Gynecology;  Laterality: Right;    Prior to Admission medications   Medication Sig Start Date End Date Taking? Authorizing Provider  acetaminophen (TYLENOL) 500 MG tablet Take 2 tablets (1,000 mg total) by mouth every 6 (six) hours as needed for mild pain. 01/16/21   Olean Ree, MD  HYDROmorphone (DILAUDID) 2 MG tablet Take 1 tablet (2 mg total) by mouth every 6 (six) hours as needed for up  to 5 days (breakthrough pain). 03/01/21 03/06/21  Will Bonnet, MD  ibuprofen (ADVIL) 600 MG tablet Take 1 tablet (600 mg total) by mouth every 6 (six) hours. 02/26/21   Will Bonnet, MD  Multiple Vitamins-Minerals (WOMENS MULTIVITAMIN) TABS Take 1 tablet by mouth daily.    [provider]  ondansetron (ZOFRAN  ODT) 4 MG disintegrating tablet Take 1 tablet (4 mg total) by mouth every 8 (eight) hours as needed for nausea or vomiting. 03/01/21   Will Bonnet, MD  pantoprazole (PROTONIX) 40 MG tablet Take 1 tablet (40 mg total) by mouth daily. 03/01/21 03/01/22  Will Bonnet, MD  promethazine (PHENERGAN) 12.5 MG tablet Take 1 tablet (12.5 mg total) by mouth every 6 (six) hours as needed. 02/24/21   Merlyn Lot, MD    Allergies  Allergen Reactions   Buspar [Buspirone]     Dystonic reaction - arms, legs, jaw "locked up"    Family History  Problem Relation Age of Onset   Hypertension Mother    Hypothyroidism Mother    Depression Mother    Diabetes Maternal Grandmother     Social History Social History   Tobacco Use   Smoking status: Never   Smokeless tobacco: Never  Vaping Use   Vaping Use: Every day   Start date: 09/20/2019   Substances: Nicotine, Flavoring  Substance Use Topics   Alcohol use: No    Alcohol/week: 0.0 standard drinks   Drug use: Yes    Frequency: 7.0 times per week    Types: Marijuana    Comment: has stopped for 10 days    Review of Systems Constitutional: Negative for fever. Cardiovascular: Negative for chest pain. Respiratory: Negative for shortness of breath. Gastrointestinal: Upper abdominal pain.  Positive for nausea vomiting.  Negative for diarrhea. Genitourinary: Negative for urinary compaints Musculoskeletal: Negative for musculoskeletal complaints Neurological: Negative for headache All other ROS negative  ____________________________________________   PHYSICAL EXAM:  VITAL SIGNS: ED Triage Vitals  Enc Vitals Group     BP 03/02/21 1855 (!) 148/88     Pulse Rate 03/02/21 1855 (!) 110     Resp 03/02/21 1855 (!) 24     Temp 03/02/21 1855 98.4 F (36.9 C)     Temp Source 03/02/21 1855 Oral     SpO2 03/02/21 1855 100 %     Weight 03/02/21 1855 103 lb 9.9 oz (47 kg)     Height --      Head Circumference --      Peak Flow --       Pain Score 03/02/21 1854 10     Pain Loc --      Pain Edu? --      Excl. in [place]? --    Constitutional: Awake alert sitting upright in bed, retching holding an emesis bag, moaning due to nausea. Eyes: Normal exam ENT      Head: Normocephalic and atraumatic.      Mouth/Throat: Mucous membranes are moist. Cardiovascular: Normal rate, regular rhythm.  Respiratory: Normal respiratory effort without tachypnea nor retractions. Breath sounds are clear  Gastrointestinal: Soft, moderate upper abdominal tenderness palpation without rebound guarding or distention. Musculoskeletal: Nontender with normal range of motion in all extremities. No lower extremity tenderness or edema. Neurologic:  Normal speech and language. No gross focal neurologic deficits  Skin:  Skin is warm, dry and intact.  Psychiatric: Mood and affect are normal.   ____________________________________________   INITIAL IMPRESSION / ASSESSMENT AND PLAN / ED COURSE  Pertinent labs & imaging results that were available during my care of the patient were reviewed by me and considered in my medical decision making (see chart for details).   Patient presents emergency department for upper abdominal discomfort and intractable nausea vomiting.  Patient was discharged yesterday from OB/GYN after an admission for intractable nausea vomiting abdominal pain.  Patient states upper abdominal discomfort and nausea vomiting unable to keep anything down.  Patient actively vomiting/retching in the emergency department.  We will check labs, we will attempt to treat with droperidol, IV fluids and continue to closely monitor.  Patient agreeable to plan of care.  Lab work largely [place] besides mild leukocytosis.  Overall unchanged from priors.  Patient is feeling better after droperidol and Zofran in the emergency department.  Patient has Zofran and Phenergan tablets at home.  We will prescribe Phenergan suppositories to be used if needed during  active nausea vomiting episodes.  Patient is feeling better.  We will discharge home.  Nakeitha Lon Fonteno was evaluated in Emergency Department on 03/02/2021 for the symptoms described in the history of present illness. She was evaluated in the context of the global COVID-19 pandemic, which necessitated consideration that the patient might be at risk for infection with the SARS-CoV-2 virus that causes COVID-19. Institutional protocols and algorithms that pertain to the evaluation of patients at risk for COVID-19 are in a state of rapid change based on information released by regulatory bodies including the CDC and federal and state organizations. These policies and algorithms were followed during the patient's care in the ED.  ____________________________________________   FINAL CLINICAL IMPRESSION(S) / ED DIAGNOSES  Cyclical vomiting syndrome   Harvest Dark, MD 03/02/21 2253

## 2021-03-03 DIAGNOSIS — R109 Unspecified abdominal pain: Secondary | ICD-10-CM | POA: Diagnosis not present

## 2021-03-03 DIAGNOSIS — R112 Nausea with vomiting, unspecified: Secondary | ICD-10-CM | POA: Diagnosis not present

## 2021-03-03 LAB — PREGNANCY, URINE: Preg Test, Ur: NEGATIVE

## 2021-03-04 ENCOUNTER — Inpatient Hospital Stay
Admission: EM | Admit: 2021-03-04 | Discharge: 2021-03-07 | DRG: 395 | Disposition: A | Discharge status: Home / Self Care | Payer: Medicaid Other | Attending: Internal Medicine | Admitting: Internal Medicine

## 2021-03-04 ENCOUNTER — Emergency Department: Payer: Medicaid Other

## 2021-03-04 ENCOUNTER — Other Ambulatory Visit: Payer: Self-pay

## 2021-03-04 DIAGNOSIS — R112 Nausea with vomiting, unspecified: Secondary | ICD-10-CM | POA: Diagnosis not present

## 2021-03-04 DIAGNOSIS — F909 Attention-deficit hyperactivity disorder, unspecified type: Secondary | ICD-10-CM | POA: Diagnosis present

## 2021-03-04 DIAGNOSIS — Z79899 Other long term (current) drug therapy: Secondary | ICD-10-CM

## 2021-03-04 DIAGNOSIS — N83201 Unspecified ovarian cyst, right side: Secondary | ICD-10-CM

## 2021-03-04 DIAGNOSIS — Z833 Family history of diabetes mellitus: Secondary | ICD-10-CM

## 2021-03-04 DIAGNOSIS — R1031 Right lower quadrant pain: Secondary | ICD-10-CM

## 2021-03-04 DIAGNOSIS — F509 Eating disorder, unspecified: Secondary | ICD-10-CM | POA: Diagnosis present

## 2021-03-04 DIAGNOSIS — K219 Gastro-esophageal reflux disease without esophagitis: Secondary | ICD-10-CM | POA: Diagnosis not present

## 2021-03-04 DIAGNOSIS — R1115 Cyclical vomiting syndrome unrelated to migraine: Secondary | ICD-10-CM | POA: Diagnosis not present

## 2021-03-04 DIAGNOSIS — F5089 Other specified eating disorder: Secondary | ICD-10-CM | POA: Diagnosis present

## 2021-03-04 DIAGNOSIS — F12188 Cannabis abuse with other cannabis-induced disorder: Secondary | ICD-10-CM

## 2021-03-04 DIAGNOSIS — Z20822 Contact with and (suspected) exposure to covid-19: Secondary | ICD-10-CM | POA: Diagnosis present

## 2021-03-04 DIAGNOSIS — R102 Pelvic and perineal pain: Secondary | ICD-10-CM | POA: Diagnosis not present

## 2021-03-04 DIAGNOSIS — Z818 Family history of other mental and behavioral disorders: Secondary | ICD-10-CM

## 2021-03-04 DIAGNOSIS — Z888 Allergy status to other drugs, medicaments and biological substances status: Secondary | ICD-10-CM

## 2021-03-04 DIAGNOSIS — F419 Anxiety disorder, unspecified: Secondary | ICD-10-CM | POA: Diagnosis present

## 2021-03-04 DIAGNOSIS — Z8249 Family history of ischemic heart disease and other diseases of the circulatory system: Secondary | ICD-10-CM

## 2021-03-04 DIAGNOSIS — R109 Unspecified abdominal pain: Secondary | ICD-10-CM

## 2021-03-04 DIAGNOSIS — D72829 Elevated white blood cell count, unspecified: Secondary | ICD-10-CM

## 2021-03-04 LAB — COMPREHENSIVE METABOLIC PANEL
ALT: 25 U/L (ref 0–44)
AST: 20 U/L (ref 15–41)
Albumin: 4.9 g/dL (ref 3.5–5.0)
Alkaline Phosphatase: 50 U/L (ref 38–126)
Anion gap: 13 (ref 5–15)
BUN: 16 mg/dL (ref 6–20)
CO2: 24 mmol/L (ref 22–32)
Calcium: 9.6 mg/dL (ref 8.9–10.3)
Chloride: 101 mmol/L (ref 98–111)
Creatinine, Ser: 0.79 mg/dL (ref 0.44–1.00)
GFR, Estimated: >60 mL/min (ref >60)
Glucose, Bld: 99 mg/dL (ref 70–99)
Potassium: 3.6 mmol/L (ref 3.5–5.1)
Sodium: 138 mmol/L (ref 135–145)
Total Bilirubin: 1.3 mg/dL — ABNORMAL HIGH (ref 0.3–1.2)
Total Protein: 8 g/dL (ref 6.5–8.1)

## 2021-03-04 LAB — HCG, QUANTITATIVE, PREGNANCY: hCG, Beta Chain, Quant, S: <1 m[IU]/mL (ref <5)

## 2021-03-04 LAB — CBC
HCT: 41.4 % (ref 36.0–46.0)
Hemoglobin: 13.9 g/dL (ref 12.0–15.0)
MCH: 28.3 pg (ref 26.0–34.0)
MCHC: 33.6 g/dL (ref 30.0–36.0)
MCV: 84.1 fL (ref 80.0–100.0)
Platelets: 348 10*3/uL (ref 150–400)
RBC: 4.92 MIL/uL (ref 3.87–5.11)
RDW: 13.6 % (ref 11.5–15.5)
WBC: 13.2 10*3/uL — ABNORMAL HIGH (ref 4.0–10.5)
nRBC: 0 % (ref 0.0–0.2)

## 2021-03-04 LAB — LIPASE, BLOOD: Lipase: 35 U/L (ref 11–51)

## 2021-03-04 LAB — RESP PANEL BY RT-PCR (FLU A&B, COVID) ARPGX2
Influenza A by PCR: NEGATIVE
Influenza B by PCR: NEGATIVE
SARS Coronavirus 2 by RT PCR: NEGATIVE

## 2021-03-04 MED ORDER — METOCLOPRAMIDE HCL 5 MG/ML IJ SOLN
10.0000 mg | Freq: Four times a day (QID) | INTRAMUSCULAR | Status: DC
  Administered 2021-03-05 – 2021-03-07 (×11): 10 mg via INTRAVENOUS
  Filled 2021-03-04 (×11): qty 2

## 2021-03-04 MED ORDER — LACTATED RINGERS IV BOLUS: 500.0000 mL | Freq: Once | INTRAVENOUS | Status: DC

## 2021-03-04 MED ORDER — ONDANSETRON 4 MG PO TBDP
4.0000 mg | ORAL_TABLET | Freq: Once | ORAL | Status: AC | PRN
  Administered 2021-03-04: 4 mg via ORAL
  Filled 2021-03-04: qty 1

## 2021-03-04 MED ORDER — PANTOPRAZOLE SODIUM 40 MG PO TBEC: 40.0000 mg | DELAYED_RELEASE_TABLET | Freq: Every day | ORAL | Status: DC

## 2021-03-04 MED ORDER — ADULT MULTIVITAMIN W/MINERALS CH
1.0000 | ORAL_TABLET | Freq: Every day | ORAL | Status: DC
  Administered 2021-03-05 – 2021-03-07 (×3): 1 via ORAL
  Filled 2021-03-04 (×3): qty 1

## 2021-03-04 MED ORDER — ACETAMINOPHEN 650 MG RE SUPP: 650.0000 mg | Freq: Four times a day (QID) | RECTAL | Status: DC | PRN

## 2021-03-04 MED ORDER — PROCHLORPERAZINE EDISYLATE 10 MG/2ML IJ SOLN
10.0000 mg | Freq: Four times a day (QID) | INTRAMUSCULAR | Status: DC | PRN
  Administered 2021-03-06 – 2021-03-07 (×2): 10 mg via INTRAVENOUS
  Filled 2021-03-04 (×2): qty 2

## 2021-03-04 MED ORDER — ONDANSETRON HCL 4 MG PO TABS: 4.0000 mg | ORAL_TABLET | Freq: Four times a day (QID) | ORAL | Status: DC | PRN

## 2021-03-04 MED ORDER — ACETAMINOPHEN 325 MG PO TABS
650.0000 mg | ORAL_TABLET | Freq: Four times a day (QID) | ORAL | Status: DC | PRN
  Administered 2021-03-06: 650 mg via ORAL
  Filled 2021-03-04: qty 2

## 2021-03-04 MED ORDER — PROCHLORPERAZINE EDISYLATE 10 MG/2ML IJ SOLN
10.0000 mg | Freq: Once | INTRAMUSCULAR | Status: AC
  Administered 2021-03-04: 10 mg via INTRAVENOUS
  Filled 2021-03-04: qty 2

## 2021-03-04 MED ORDER — ONDANSETRON 4 MG PO TBDP: 4.0000 mg | ORAL_TABLET | Freq: Three times a day (TID) | ORAL | Status: DC | PRN

## 2021-03-04 MED ORDER — DROPERIDOL 2.5 MG/ML IJ SOLN
2.5000 mg | Freq: Four times a day (QID) | INTRAMUSCULAR | Status: DC | PRN
  Administered 2021-03-04: 2.5 mg via INTRAVENOUS
  Filled 2021-03-04 (×2): qty 2

## 2021-03-04 MED ORDER — LACTATED RINGERS IV BOLUS
1000.0000 mL | Freq: Once | INTRAVENOUS | Status: AC
  Administered 2021-03-04: 1000 mL via INTRAVENOUS

## 2021-03-04 MED ORDER — POTASSIUM CHLORIDE IN NACL 20-0.9 MEQ/L-% IV SOLN
INTRAVENOUS | Status: DC
  Filled 2021-03-04 (×12): qty 1000

## 2021-03-04 MED ORDER — MAGNESIUM HYDROXIDE 400 MG/5ML PO SUSP: 30.0000 mL | Freq: Every day | ORAL | Status: DC | PRN

## 2021-03-04 MED ORDER — DROPERIDOL 2.5 MG/ML IJ SOLN
2.5000 mg | Freq: Once | INTRAMUSCULAR | Status: AC
  Administered 2021-03-04: 2.5 mg via INTRAVENOUS
  Filled 2021-03-04: qty 2

## 2021-03-04 MED ORDER — ENOXAPARIN SODIUM 40 MG/0.4ML IJ SOSY
40.0000 mg | PREFILLED_SYRINGE | INTRAMUSCULAR | Status: DC
  Filled 2021-03-04 (×3): qty 0.4

## 2021-03-04 MED ORDER — METOCLOPRAMIDE HCL 5 MG/ML IJ SOLN
10.0000 mg | Freq: Once | INTRAMUSCULAR | Status: AC
  Administered 2021-03-04: 10 mg via INTRAVENOUS
  Filled 2021-03-04: qty 2

## 2021-03-04 MED ORDER — ONDANSETRON HCL 4 MG/2ML IJ SOLN: 4.0000 mg | Freq: Four times a day (QID) | INTRAMUSCULAR | Status: DC | PRN

## 2021-03-04 MED ORDER — TRAZODONE HCL 50 MG PO TABS: 25.0000 mg | ORAL_TABLET | Freq: Every evening | ORAL | Status: DC | PRN

## 2021-03-04 NOTE — ED Notes (Signed)
Pt with call light on, writer to check on pt, pt states she does not need anything. Pt sitting up with emesis bag in front of reporting ongoing nausea. No vomit noted in bag at this time, provider aware

## 2021-03-04 NOTE — ED Notes (Signed)
Refused [place]. Dr Charna Archer made aware.

## 2021-03-04 NOTE — ED Notes (Signed)
Pt states "i'm so mad right now I could kill myself". Dr. Charna Archer made aware.

## 2021-03-04 NOTE — ED Notes (Signed)
Pt reports feels slightly better, lights dimmed, no other needs at this time

## 2021-03-04 NOTE — ED Provider Notes (Signed)
[facility] Emergency Department Provider Note   ____________________________________________   Event Date/Time   First MD Initiated Contact with Patient 03/04/21 1501     (approximate)  I have reviewed the triage vital signs and the nursing notes.   HISTORY  Chief Complaint Abdominal Pain, Nausea, and Emesis    HPI Tiffany Guzman is a 25 y.o. female with possible history of cyclic vomiting syndrome, GERD, and migraines who presents to the ED complaining of abdominal pain.  Patient reports that she has been dealing with recurrent abdominal pain with multiple episodes of nausea and vomiting for least the past week now.  She describes the pain as in the central lower part of her abdomen, does not localize to either the right or left side.  She has been feeling nauseous and has vomited multiple times, reports small streaks of blood in her emesis at times.  She denies any diarrhea and has not noticed any blood in her stool.  She does endorse flank pain but denies any fevers, dysuria, or hematuria.  She has had multiple ED visits since the onset of the symptoms, was found to have an ovarian cyst and underwent right ovarian cystectomy 1 week ago.  Pain has not improved since then.  She also underwent cholecystectomy about 2 weeks ago with no change in her symptoms.  She states Phenergan prescribed at home has not helped.  She has been seen in the [facility] ED and [place] ED on the past 2 days.        Past Medical History:  Diagnosis Date   Acne    Anxiety    Attention deficit hyperactivity disorder (ADHD)    Chronic upper back pain    lower and upper back pain   Depression    Dyspnea    GERD (gastroesophageal reflux disease)    Gestational hypertension 11/14/2017   Migraine with aura    since elementary school   Postoperative nausea and vomiting 02/27/2021    Patient Active Problem List   Diagnosis Date Noted   Nausea and vomiting in adult 02/28/2021    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   Postoperative nausea and vomiting 02/27/2021   Bradycardia 02/26/2021   Right lower quadrant abdominal pain 02/25/2021   Right ovarian cyst 02/25/2021   Leukocytosis 02/25/2021   Status post ovarian cystectomy 02/25/2021   Cholecystitis, acute    Stomach ache    Status post cesarean delivery 11/14/2017   Dermoid cyst 10/03/2017   Dermoid cyst of left ovary 10/01/2017   Anxiety 06/24/2016    Past Surgical History:  Procedure Laterality Date   CESAREAN SECTION N/A 11/14/2017   Procedure: CESAREAN SECTION;  Surgeon: Will Bonnet, MD;  Location: [facility] ORS;  Service: Obstetrics;  Laterality: N/A;   ESOPHAGOGASTRODUODENOSCOPY Left 02/17/2019   Procedure: ESOPHAGOGASTRODUODENOSCOPY (EGD);  Surgeon: Virgel Manifold, MD;  Location: [facility] ENDOSCOPY;  Service: Endoscopy;  Laterality: Left;   INSERTION OF NON VAGINAL CONTRACEPTIVE DEVICE     LAPAROSCOPIC OVARIAN CYSTECTOMY Right 02/23/2019   Procedure: LAPAROSCOPIC OVARIAN CYSTECTOMY;  Surgeon: Homero Fellers, MD;  Location: [facility] ORS;  Service: Gynecology;  Laterality: Right;   LAPAROSCOPY N/A 02/25/2021   Procedure: LAPAROSCOPY DIAGNOSTIC;  Surgeon: Will Bonnet, MD;  Location: [facility] ORS;  Service: Gynecology;  Laterality: N/A;   OOPHORECTOMY Left 10/03/2017   Procedure: OOPHORECTOMY;  Surgeon: Homero Fellers, MD;  Location: [facility] ORS;  Service: Gynecology;  Laterality: Left;   OVARIAN CYST REMOVAL Left 10/03/2017   Procedure:  OVARIAN CYSTECTOMY;  Surgeon: Homero Fellers, MD;  Location: [facility] ORS;  Service: Gynecology;  Laterality: Left;   OVARIAN CYST REMOVAL Right 02/25/2021   Procedure: OVARIAN CYSTECTOMY;  Surgeon: Will Bonnet, MD;  Location: [facility] ORS;  Service: Gynecology;  Laterality: Right;    Prior to Admission medications   Medication Sig Start Date End Date Taking? Authorizing Provider  acetaminophen (TYLENOL) 500 MG tablet Take 2 tablets (1,000 mg total) by  mouth every 6 (six) hours as needed for mild pain. 01/16/21   Olean Ree, MD  HYDROmorphone (DILAUDID) 2 MG tablet Take 1 tablet (2 mg total) by mouth every 6 (six) hours as needed for up to 5 days (breakthrough pain). 03/01/21 03/06/21  Will Bonnet, MD  ibuprofen (ADVIL) 600 MG tablet Take 1 tablet (600 mg total) by mouth every 6 (six) hours. 02/26/21   Will Bonnet, MD  Multiple Vitamins-Minerals (WOMENS MULTIVITAMIN) TABS Take 1 tablet by mouth daily.    [provider]  ondansetron (ZOFRAN ODT) 4 MG disintegrating tablet Take 1 tablet (4 mg total) by mouth every 8 (eight) hours as needed for nausea or vomiting. 03/01/21   Will Bonnet, MD  pantoprazole (PROTONIX) 40 MG tablet Take 1 tablet (40 mg total) by mouth daily. 03/01/21 03/01/22  Will Bonnet, MD  promethazine (PHENERGAN) 12.5 MG tablet Take 1 tablet (12.5 mg total) by mouth every 6 (six) hours as needed. 02/24/21   Merlyn Lot, MD  promethazine (PHENERGAN) 25 MG suppository Place 1 suppository (25 mg total) rectally every 6 (six) hours as needed for nausea or vomiting. 03/02/21   Harvest Dark, MD    Allergies Buspar [buspirone]  Family History  Problem Relation Age of Onset   Hypertension Mother    Hypothyroidism Mother    Depression Mother    Diabetes Maternal Grandmother     Social History Social History   Tobacco Use   Smoking status: Never   Smokeless tobacco: Never  Vaping Use   Vaping Use: Every day   Start date: 09/20/2019   Substances: Nicotine, Flavoring  Substance Use Topics   Alcohol use: No    Alcohol/week: 0.0 standard drinks   Drug use: Yes    Frequency: 7.0 times per week    Types: Marijuana    Comment: has stopped for 10 days    Review of Systems  Constitutional: No fever/chills Eyes: No visual changes. ENT: No sore throat. Cardiovascular: Denies chest pain. Respiratory: Denies shortness of breath. Gastrointestinal: Positive for abdominal pain, nausea, and  vomiting.  No diarrhea.  No constipation. Genitourinary: Negative for dysuria. Musculoskeletal: Negative for back pain. Skin: Negative for rash. Neurological: Negative for headaches, focal weakness or numbness.  ____________________________________________   PHYSICAL EXAM:  VITAL SIGNS: ED Triage Vitals  Enc Vitals Group     BP 03/04/21 1240 (!) 155/84     Pulse Rate 03/04/21 1239 78     Resp 03/04/21 1239 18     Temp 03/04/21 1239 98.3 F (36.8 C)     Temp Source 03/04/21 1239 Oral     SpO2 03/04/21 1239 100 %     Weight 03/04/21 1241 100 lb (45.4 kg)     Height 03/04/21 1241 '5\' 4"'$  (1.626 m)     Head Circumference --      Peak Flow --      Pain Score 03/04/21 1241 10     Pain Loc --      Pain Edu? --  Excl. in [place]? --     Constitutional: Alert and oriented. Eyes: Conjunctivae are normal. Head: Atraumatic. Nose: No congestion/rhinnorhea. Mouth/Throat: Mucous membranes are dry. Neck: Normal ROM Cardiovascular: Normal rate, regular rhythm. Grossly normal heart sounds. Respiratory: Normal respiratory effort.  No retractions. Lungs CTAB. Gastrointestinal: Soft and diffusely tender to palpation with voluntary guarding. No distention.  Laparoscopic surgical sites clean, dry, and intact. Genitourinary: deferred Musculoskeletal: No lower extremity tenderness nor edema. Neurologic:  Normal speech and language. No gross focal neurologic deficits are appreciated. Skin:  Skin is warm, dry and intact. No rash noted. Psychiatric: Mood and affect are normal. Speech and behavior are normal.  ____________________________________________   LABS (all labs ordered are listed, but only abnormal results are displayed)  Labs Reviewed  COMPREHENSIVE METABOLIC PANEL - Abnormal; Notable for the following components:      Result Value   Total Bilirubin 1.3 (*)    All other components within normal limits  CBC - Abnormal; Notable for the following components:   WBC 13.2 (*)    All  other components within normal limits  RESP PANEL BY RT-PCR (FLU A&B, COVID) ARPGX2  LIPASE, BLOOD  HCG, QUANTITATIVE, PREGNANCY  URINALYSIS, COMPLETE (UACMP) WITH MICROSCOPIC  POC URINE PREG, ED    PROCEDURES  Procedure(s) performed (including Critical Care):  Procedures   ____________________________________________   INITIAL IMPRESSION / ASSESSMENT AND PLAN / ED COURSE      25 year old female with past medical history of cyclic vomiting syndrome, GERD, and migraines who presents to the ED with worsening midline lower abdominal pain associated with multiple episodes of nausea and vomiting.  She is status post recent right ovarian cystectomy, had [facility] yesterday that was unremarkable outside of possible hypoperfusion of her right ovary.  Follow-up ultrasound was negative for torsion and she was discharged home, states she has had no relief of symptoms since then.  She is diffusely tender to palpation on exam and we will treat with IV droperidol as this seems to have helped her in the past.  Labs thus far are unremarkable, LFTs and lipase within normal limits.  Recent pregnancy testing and UA have been negative but we will repeat today.  Repeat CT scan does not appear indicated as she has had multiple reassuring scans recently.  We will repeat pelvic ultrasound given questionable hypoperfusion yesterday.  Patient declines pelvic ultrasound, states she does not want another one after yesterday's was normal at [facility].  She had slight improvement in pain and nausea following droperidol and Reglan, however upon attempting to drink some water she vomited this back up.  She now continues to dry heave with worsening pain, we will attempt to control symptoms with Compazine.  She will benefit from admission for further management of intractable nausea and vomiting.      ____________________________________________   FINAL CLINICAL IMPRESSION(S) / ED DIAGNOSES  Final diagnoses:   Pelvic pain  Intractable nausea and vomiting  Cyclic vomiting syndrome     ED Discharge Orders     None        Note:  This document was prepared using Dragon voice recognition software and may include unintentional dictation errors.    Blake Divine, MD 03/04/21 2039

## 2021-03-04 NOTE — ED Triage Notes (Addendum)
Pt arrives via pov from home with c/o abd pain., n/v x 1 week. Pt nauseous in triage. And leaning over holding stomach. Pt denies recent marijuana use.

## 2021-03-04 NOTE — H&P (Signed)
[place]   PATIENT NAME: Tiffany Guzman    MR#:  [id number]  DATE OF BIRTH:  09/28/1995  DATE OF ADMISSION:  03/04/2021  PRIMARY CARE PHYSICIAN: [place]   Patient is coming from: Home  REQUESTING/REFERRING PHYSICIAN: Blake Divine, MD.  CHIEF COMPLAINT:   Chief Complaint  Patient presents with   Abdominal Pain   Nausea   Emesis   HISTORY OF PRESENT ILLNESS:  Tiffany Guzman is a 25 y.o. Caucasian female with medical history significant for of anxiety, depression, migraine, GERD, ADHD, who underwent laparoscopic right ovarian cystectomy earlier this month and on 01/16/2021 underwent robotic cholecystectomy.  She was admitted on 8/9 for intractable nausea and vomiting and was noted to have leukocytosis.  Her abdominal CT scan revealed postoperative changes and some free fluid.  She has a history of daily cannabis use and has had several ER presentations for cannabinoid hyperemesis syndrome in the past that responded to Haldol.  She denied cannabis use in the postoperative period.  She was seen by our hospitalist team for asymptomatic bradycardia and was advised to follow with cardiology.  GI consult then was obtained and recommendation was to stop marijuana and decreasing and minimizing narcotics when possible.  She was noted to have periportal edema but her liver enzymes were within normal and recommendation was for further follow-up on an outpatient basis possibly with repeat right upper quadrant ultrasound.  She had 3 ER visits in the last 3 days including today and presents today with intractable nausea and vomiting with associated abdominal pain that she describes in the central lower abdomen without localization.  No bilious vomitus or hematemesis.  No diarrhea.  She admitted to flank pain without any fever or chills.  No dysuria, oliguria or hematuria.  No melena or bright red thing per rectum.  She stated that Phenergan that was prescribed to her  at home did not help.  She was seen at [facility] ED as well as [place] ED on the past 2 days.    ED Course: When she came to the ER blood pressure was one 155/84 with otherwise normal vital signs. The patient CMP was unremarkable.  CBC showed leukocytosis of 13.2.  UPT was -2 days ago.  UA was showing 6-10 WBCs 2 days ago.  Imaging: The patient refused radiographic studies.  The patient was given 2.5 mg of IV Inapsine, 10 mg IV Reglan, 4 mg IV Zofran, 10 mg of Compazine and 1 L of IV lactated Ringer.  The patient will be admitted to an observation medical bed for further evaluation and management.  PAST MEDICAL HISTORY:   Past Medical History:  Diagnosis Date   Acne    Anxiety    Attention deficit hyperactivity disorder (ADHD)    Chronic upper back pain    lower and upper back pain   Depression    Dyspnea    GERD (gastroesophageal reflux disease)    Gestational hypertension 11/14/2017   Migraine with aura    since elementary school   Postoperative nausea and vomiting 02/27/2021    PAST SURGICAL HISTORY:   Past Surgical History:  Procedure Laterality Date   CESAREAN SECTION N/A 11/14/2017   Procedure: CESAREAN SECTION;  Surgeon: Will Bonnet, MD;  Location: [facility] ORS;  Service: Obstetrics;  Laterality: N/A;   ESOPHAGOGASTRODUODENOSCOPY Left 02/17/2019   Procedure: ESOPHAGOGASTRODUODENOSCOPY (EGD);  Surgeon: Virgel Manifold, MD;  Location: [facility] ENDOSCOPY;  Service: Endoscopy;  Laterality: Left;   INSERTION  OF NON VAGINAL CONTRACEPTIVE DEVICE     LAPAROSCOPIC OVARIAN CYSTECTOMY Right 02/23/2019   Procedure: LAPAROSCOPIC OVARIAN CYSTECTOMY;  Surgeon: Homero Fellers, MD;  Location: [facility] ORS;  Service: Gynecology;  Laterality: Right;   LAPAROSCOPY N/A 02/25/2021   Procedure: LAPAROSCOPY DIAGNOSTIC;  Surgeon: Will Bonnet, MD;  Location: [facility] ORS;  Service: Gynecology;  Laterality: N/A;   OOPHORECTOMY Left 10/03/2017   Procedure: OOPHORECTOMY;  Surgeon: Homero Fellers, MD;  Location: [facility] ORS;  Service: Gynecology;  Laterality: Left;   OVARIAN CYST REMOVAL Left 10/03/2017   Procedure: OVARIAN CYSTECTOMY;  Surgeon: Homero Fellers, MD;  Location: [facility] ORS;  Service: Gynecology;  Laterality: Left;   OVARIAN CYST REMOVAL Right 02/25/2021   Procedure: OVARIAN CYSTECTOMY;  Surgeon: Will Bonnet, MD;  Location: [facility] ORS;  Service: Gynecology;  Laterality: Right;    SOCIAL HISTORY:   Social History   Tobacco Use   Smoking status: Never   Smokeless tobacco: Never  Substance Use Topics   Alcohol use: No    Alcohol/week: 0.0 standard drinks    FAMILY HISTORY:   Family History  Problem Relation Age of Onset   Hypertension Mother    Hypothyroidism Mother    Depression Mother    Diabetes Maternal Grandmother     DRUG ALLERGIES:   Allergies  Allergen Reactions   Buspar [Buspirone]     Dystonic reaction - arms, legs, jaw "locked up"    REVIEW OF SYSTEMS:   ROS As per history of present illness. All pertinent systems were reviewed above. Constitutional, HEENT, cardiovascular, respiratory, GI, GU, musculoskeletal, neuro, psychiatric, endocrine, integumentary and hematologic systems were reviewed and are otherwise negative/unremarkable except for positive findings mentioned above in the HPI.   MEDICATIONS AT HOME:   Prior to Admission medications   Medication Sig Start Date End Date Taking? Authorizing Provider  acetaminophen (TYLENOL) 500 MG tablet Take 2 tablets (1,000 mg total) by mouth every 6 (six) hours as needed for mild pain. 01/16/21   Olean Ree, MD  HYDROmorphone (DILAUDID) 2 MG tablet Take 1 tablet (2 mg total) by mouth every 6 (six) hours as needed for up to 5 days (breakthrough pain). 03/01/21 03/06/21  Will Bonnet, MD  ibuprofen (ADVIL) 600 MG tablet Take 1 tablet (600 mg total) by mouth every 6 (six) hours. 02/26/21   Will Bonnet, MD  Multiple Vitamins-Minerals (WOMENS MULTIVITAMIN) TABS Take 1 tablet by  mouth daily.    [provider]  ondansetron (ZOFRAN ODT) 4 MG disintegrating tablet Take 1 tablet (4 mg total) by mouth every 8 (eight) hours as needed for nausea or vomiting. 03/01/21   Will Bonnet, MD  pantoprazole (PROTONIX) 40 MG tablet Take 1 tablet (40 mg total) by mouth daily. 03/01/21 03/01/22  Will Bonnet, MD  promethazine (PHENERGAN) 12.5 MG tablet Take 1 tablet (12.5 mg total) by mouth every 6 (six) hours as needed. 02/24/21   Merlyn Lot, MD  promethazine (PHENERGAN) 25 MG suppository Place 1 suppository (25 mg total) rectally every 6 (six) hours as needed for nausea or vomiting. 03/02/21   Harvest Dark, MD      VITAL SIGNS:  Blood pressure (!) 151/90, pulse 64, temperature 98.9 F (37.2 C), temperature source Oral, resp. rate 16, height '5\' 4"'$  (1.626 m), weight 45.4 kg, last menstrual period 02/17/2021, SpO2 100 %.  PHYSICAL EXAMINATION:  Physical Exam  GENERAL:  25 y.o.-year-old Caucasian female patient lying in the bed with no acute distress.  EYES: Pupils  equal, round, reactive to light and accommodation. No scleral icterus. Extraocular muscles intact.  HEENT: Head atraumatic, normocephalic. Oropharynx and nasopharynx clear.  NECK:  Supple, no jugular venous distention. No thyroid enlargement, no tenderness.  LUNGS: Normal breath sounds bilaterally, no wheezing, rales,rhonchi or crepitation. No use of accessory muscles of respiration.  CARDIOVASCULAR: Regular rate and rhythm, S1, S2 normal. No murmurs, rubs, or gallops.  ABDOMEN: Soft, nondistended, with epigastric and periumbilical tenderness without rebound tenderness guarding or rigidity.. Bowel sounds present. No organomegaly or mass.  EXTREMITIES: No pedal edema, cyanosis, or clubbing.  NEUROLOGIC: Cranial nerves II through XII are intact. Muscle strength 5/5 in all extremities. Sensation intact. Gait not checked.  PSYCHIATRIC: The patient is alert and oriented x 3.  Normal affect and good eye  contact. SKIN: No obvious rash, lesion, or ulcer.   LABORATORY PANEL:   CBC Recent Labs  Lab 03/04/21 1247  WBC 13.2*  HGB 13.9  HCT 41.4  PLT 348   ------------------------------------------------------------------------------------------------------------------  Chemistries  Recent Labs  Lab 03/04/21 1247  NA 138  K 3.6  CL 101  CO2 24  GLUCOSE 99  BUN 16  CREATININE 0.79  CALCIUM 9.6  AST 20  ALT 25  ALKPHOS 50  BILITOT 1.3*   ------------------------------------------------------------------------------------------------------------------  Cardiac Enzymes No results for input(s): TROPONINI in the last 168 hours. ------------------------------------------------------------------------------------------------------------------  RADIOLOGY:  No results found.    IMPRESSION AND PLAN:  Active Problems:   Intractable nausea and vomiting  1.  Intractable nausea and vomiting likely cyclical vomiting could be related to cannabinoid hyperemesis syndrome. - The patient will be admitted to an observation medical bed. - We will place on scheduled IV Reglan and as needed IV Compazine. - She will be hydrated with IV normal saline. - She will be placed on clear liquids to be advanced as tolerated. - We will place on IV PPI therapy. - The patient has responded to Haldol in the past.  She will be placed on as needed IM Haldol. - The patient was advised to stay off marijuana.  2.  GERD. - We will continue her on IV PPI therapy as mentioned above.   DVT prophylaxis: Lovenox. Code Status: full code. Family Communication:  The plan of care was discussed in details with the patient (and family). I answered all questions. The patient agreed to proceed with the above mentioned plan. Further management will depend upon hospital course. Disposition Plan: Back to previous home environment Consults called: none. All the records are reviewed and case discussed with ED  provider.  Status is: Observation  The patient remains OBS appropriate and will d/c before 2 midnights.  Dispo: The patient is from: Home              Anticipated d/c is to: Home              Patient currently is not medically stable to d/c.   Difficult to place patient No  TOTAL TIME TAKING CARE OF THIS PATIENT: 55 minutes.    Christel Mormon M.D on 03/04/2021 at 9:20 PM  [facility]   From 7 PM-7 AM, contact night-coverage [email or web address]  CC: Primary care physician; [place]

## 2021-03-04 NOTE — ED Notes (Signed)
Pt sitting up on bed at this time

## 2021-03-05 DIAGNOSIS — F909 Attention-deficit hyperactivity disorder, unspecified type: Secondary | ICD-10-CM | POA: Diagnosis present

## 2021-03-05 DIAGNOSIS — K219 Gastro-esophageal reflux disease without esophagitis: Secondary | ICD-10-CM | POA: Diagnosis present

## 2021-03-05 DIAGNOSIS — R109 Unspecified abdominal pain: Secondary | ICD-10-CM | POA: Diagnosis not present

## 2021-03-05 DIAGNOSIS — Z888 Allergy status to other drugs, medicaments and biological substances status: Secondary | ICD-10-CM | POA: Diagnosis not present

## 2021-03-05 DIAGNOSIS — Z8249 Family history of ischemic heart disease and other diseases of the circulatory system: Secondary | ICD-10-CM | POA: Diagnosis not present

## 2021-03-05 DIAGNOSIS — Z818 Family history of other mental and behavioral disorders: Secondary | ICD-10-CM | POA: Diagnosis not present

## 2021-03-05 DIAGNOSIS — R1110 Vomiting, unspecified: Secondary | ICD-10-CM | POA: Diagnosis not present

## 2021-03-05 DIAGNOSIS — F419 Anxiety disorder, unspecified: Secondary | ICD-10-CM | POA: Diagnosis present

## 2021-03-05 DIAGNOSIS — F509 Eating disorder, unspecified: Secondary | ICD-10-CM | POA: Diagnosis not present

## 2021-03-05 DIAGNOSIS — R1010 Upper abdominal pain, unspecified: Secondary | ICD-10-CM | POA: Diagnosis not present

## 2021-03-05 DIAGNOSIS — Z20822 Contact with and (suspected) exposure to covid-19: Secondary | ICD-10-CM | POA: Diagnosis present

## 2021-03-05 DIAGNOSIS — R112 Nausea with vomiting, unspecified: Secondary | ICD-10-CM | POA: Diagnosis not present

## 2021-03-05 DIAGNOSIS — R102 Pelvic and perineal pain: Secondary | ICD-10-CM | POA: Diagnosis not present

## 2021-03-05 DIAGNOSIS — Z833 Family history of diabetes mellitus: Secondary | ICD-10-CM | POA: Diagnosis not present

## 2021-03-05 DIAGNOSIS — R1115 Cyclical vomiting syndrome unrelated to migraine: Secondary | ICD-10-CM | POA: Diagnosis not present

## 2021-03-05 DIAGNOSIS — Z79899 Other long term (current) drug therapy: Secondary | ICD-10-CM | POA: Diagnosis not present

## 2021-03-05 LAB — BASIC METABOLIC PANEL
Anion gap: 10 (ref 5–15)
BUN: 12 mg/dL (ref 6–20)
CO2: 23 mmol/L (ref 22–32)
Calcium: 8.7 mg/dL — ABNORMAL LOW (ref 8.9–10.3)
Chloride: 101 mmol/L (ref 98–111)
Creatinine, Ser: 0.6 mg/dL (ref 0.44–1.00)
GFR, Estimated: >60 mL/min (ref >60)
Glucose, Bld: 70 mg/dL (ref 70–99)
Potassium: 3.8 mmol/L (ref 3.5–5.1)
Sodium: 134 mmol/L — ABNORMAL LOW (ref 135–145)

## 2021-03-05 LAB — URINALYSIS, COMPLETE (UACMP) WITH MICROSCOPIC
Bilirubin Urine: NEGATIVE
Glucose, UA: NEGATIVE mg/dL
Ketones, ur: 80 mg/dL — AB
Nitrite: NEGATIVE
Protein, ur: NEGATIVE mg/dL
Specific Gravity, Urine: 1.02 (ref 1.005–1.030)
pH: 6 (ref 5.0–8.0)

## 2021-03-05 LAB — CBC
HCT: 33 % — ABNORMAL LOW (ref 36.0–46.0)
Hemoglobin: 11.3 g/dL — ABNORMAL LOW (ref 12.0–15.0)
MCH: 29.4 pg (ref 26.0–34.0)
MCHC: 34.2 g/dL (ref 30.0–36.0)
MCV: 85.7 fL (ref 80.0–100.0)
Platelets: 289 10*3/uL (ref 150–400)
RBC: 3.85 MIL/uL — ABNORMAL LOW (ref 3.87–5.11)
RDW: 13.2 % (ref 11.5–15.5)
WBC: 11.1 10*3/uL — ABNORMAL HIGH (ref 4.0–10.5)
nRBC: 0 % (ref 0.0–0.2)

## 2021-03-05 LAB — HIV ANTIBODY (ROUTINE TESTING W REFLEX): HIV Screen 4th Generation wRfx: NONREACTIVE

## 2021-03-05 MED ORDER — PANTOPRAZOLE SODIUM 40 MG PO TBEC
40.0000 mg | DELAYED_RELEASE_TABLET | Freq: Two times a day (BID) | ORAL | Status: DC
  Administered 2021-03-05 – 2021-03-07 (×4): 40 mg via ORAL
  Filled 2021-03-05 (×4): qty 1

## 2021-03-05 NOTE — ED Notes (Signed)
Secure chat sent to floor

## 2021-03-05 NOTE — ED Notes (Signed)
Candace RN aware of assigned bed 

## 2021-03-05 NOTE — Progress Notes (Signed)
PROGRESS NOTE    Tiffany Guzman  [id number] DOB: Aug 19, 1995 DOA: 03/04/2021 PCP: [place]   Brief Narrative:  25 y.o. Caucasian female with medical history significant for of anxiety, depression, migraine, GERD, ADHD, who underwent laparoscopic right ovarian cystectomy earlier this month and on 01/16/2021 underwent robotic cholecystectomy.  She was admitted on 8/9 for intractable nausea and vomiting and was noted to have leukocytosis.  Her abdominal CT scan revealed postoperative changes and some free fluid.  She has a history of daily cannabis use and has had several ER presentations for cannabinoid hyperemesis syndrome in the past that responded to Haldol.  She denied cannabis use in the postoperative period.  She was seen by our hospitalist team for asymptomatic bradycardia and was advised to follow with cardiology.  GI consult then was obtained and recommendation was to stop marijuana and decreasing and minimizing narcotics when possible.  She was noted to have periportal edema but her liver enzymes were within normal and recommendation was for further follow-up on an outpatient basis possibly with repeat right upper quadrant ultrasound.  She had 3 ER visits in the last 3 days including today and presents today with intractable nausea and vomiting with associated abdominal pain that she describes in the central lower abdomen without localization  Started on a regimen of scheduled Reglan, as needed droperidol, as needed Phenergan.  Starting to have some beneficial effect.   Assessment & Plan:   Active Problems:   Intractable nausea and vomiting  Intractable nausea and vomiting Cyclical vomiting syndrome Possible cannabis hyperemesis syndrome Patient with recurrent ER visits to the same complaints Work-up has been negative in the past Suspect cyclical vomiting or other due to anxiety or cannabis use Plan: Continue Reglan 10 mg 3 times daily scheduled As  needed IV Phenergan As needed IV droperidol Advised cessation of cannabis use Continue IV fluids for today Twice daily PPI  GERD PPI   DVT prophylaxis: SQ Lovenox Code Status: Full Family Communication: None today Disposition Plan: Status is: Observation  The patient will require care spanning > 2 midnights and should be moved to inpatient because: Inpatient level of care appropriate due to severity of illness  Dispo: The patient is from: Home              Anticipated d/c is to: Home              Patient currently is not medically stable to d/c.   Difficult to place patient No  Intractable nausea and vomiting associated with poor p.o. intake.  On IV fluids and scheduled and as needed antiemetics.  Possible discharge in 24 hours     Level of care: Med-Surg  Consultants:  None  Procedures:  None  Antimicrobials:  None   Subjective: Seen and examined.  Continues to endorse nausea and poor p.o. intake.  Objective: Vitals:   03/04/21 2130 03/04/21 2230 03/05/21 0000 03/05/21 0822  BP: (!) 152/96 (!) 149/93 (!) 152/92 122/77  Pulse: 64 67 78 72  Resp:  '14 14 15  '$ Temp:    98.1 F (36.7 C)  TempSrc:    Oral  SpO2: 100% 100% 100% 98%  Weight:      Height:       No intake or output data in the 24 hours ending 03/05/21 1242 Filed Weights   03/04/21 1241  Weight: 45.4 kg    Examination:  General exam: Mild distress due to nausea.  Appears fatigued Respiratory system: Clear to  auscultation. Respiratory effort normal. Cardiovascular system: S1 & S2 heard, RRR. No JVD, murmurs, rubs, gallops or clicks. No pedal edema. Gastrointestinal system: Thin, nontender, nondistended, normal bowel sounds Central nervous system: Alert and oriented. No focal neurological deficits. Extremities: Symmetric 5 x 5 power. Skin: No rashes, lesions or ulcers Psychiatry: Judgement and insight appear normal. Mood & affect appropriate.     Data Reviewed: I have personally reviewed  following labs and imaging studies  CBC: Recent Labs  Lab 02/27/21 0047 02/27/21 1044 03/02/21 1858 03/04/21 1247 03/05/21 0653  WBC 22.1* 14.8* 15.2* 13.2* 11.1*  HGB 12.5 10.7* 14.4 13.9 11.3*  HCT 37.2 32.1* 41.2 41.4 33.0*  MCV 87.7 85.8 82.1 84.1 85.7  PLT 287 219 340 348 [id number]   Basic Metabolic Panel: Recent Labs  Lab 02/27/21 0047 02/27/21 1044 03/02/21 1858 03/04/21 1247 03/05/21 0653  NA 138 136 139 138 134*  K 3.3* 3.2* 3.6 3.6 3.8  CL 105 101 100 101 101  CO2 '26 26 24 24 23  '$ GLUCOSE 100* 125* 109* 99 70  BUN '15 11 11 16 12  '$ CREATININE 0.68 0.71 0.80 0.79 0.60  CALCIUM 9.0 7.9* 9.7 9.6 8.7*   GFR: Estimated Creatinine Clearance: 77 mL/min (by C-G formula based on SCr of 0.6 mg/dL). Liver Function Tests: Recent Labs  Lab 02/27/21 0047 03/02/21 1858 03/04/21 1247  AST '18 31 20  '$ ALT '20 28 25  '$ ALKPHOS 53 55 50  BILITOT 1.1 1.0 1.3*  PROT 7.1 7.8 8.0  ALBUMIN 4.6 5.1* 4.9   Recent Labs  Lab 03/02/21 1858 03/04/21 1247  LIPASE 37 35   No results for input(s): AMMONIA in the last 168 hours. Coagulation Profile: No results for input(s): INR, PROTIME in the last 168 hours. Cardiac Enzymes: No results for input(s): CKTOTAL, CKMB, CKMBINDEX, TROPONINI in the last 168 hours. BNP (last 3 results) No results for input(s): PROBNP in the last 8760 hours. HbA1C: No results for input(s): HGBA1C in the last 72 hours. CBG: No results for input(s): GLUCAP in the last 168 hours. Lipid Profile: No results for input(s): CHOL, HDL, LDLCALC, TRIG, CHOLHDL, LDLDIRECT in the last 72 hours. Thyroid Function Tests: No results for input(s): TSH, T4TOTAL, FREET4, T3FREE, THYROIDAB in the last 72 hours. Anemia Panel: No results for input(s): VITAMINB12, FOLATE, FERRITIN, TIBC, IRON, RETICCTPCT in the last 72 hours. Sepsis Labs: No results for input(s): PROCALCITON, LATICACIDVEN in the last 168 hours.  Recent Results (from the past 240 hour(s))  Chlamydia/NGC rt PCR  ([facility] only)     Status: None   Collection Time: 02/25/21  4:58 PM   Specimen: Cervical/Vaginal swab  Result Value Ref Range Status   Specimen source GC/Chlam ENDOCERVICAL  Final   Chlamydia Tr NOT DETECTED NOT DETECTED Final   N gonorrhoeae NOT DETECTED NOT DETECTED Final    Comment: (NOTE) This CT/NG assay has not been evaluated in patients with a history of  hysterectomy. Performed at [facility], [address] [place], [place] [address]   Wet prep, genital     Status: Abnormal   Collection Time: 02/25/21  4:58 PM   Specimen: Cervical/Vaginal swab  Result Value Ref Range Status   Yeast Wet Prep HPF POC NONE SEEN NONE SEEN Final   Trich, Wet Prep NONE SEEN NONE SEEN Final   Clue Cells Wet Prep HPF POC NONE SEEN NONE SEEN Final   WBC, Wet Prep HPF POC MANY (A) NONE SEEN Final   Sperm NONE SEEN  Final  Comment: Performed at [facility], [address] [place], [place] [address]  Resp Panel by RT-PCR (Flu A&B, Covid) Nasopharyngeal Swab     Status: None   Collection Time: 03/04/21  8:58 PM   Specimen: Nasopharyngeal Swab; Nasopharyngeal(NP) swabs in vial transport medium  Result Value Ref Range Status   SARS Coronavirus 2 by RT PCR NEGATIVE NEGATIVE Final    Comment: (NOTE) SARS-CoV-2 target nucleic acids are NOT DETECTED.  The SARS-CoV-2 RNA is generally detectable in upper respiratory specimens during the acute phase of infection. The lowest concentration of SARS-CoV-2 viral copies this assay can detect is 138 copies/mL. A negative result does not preclude SARS-Cov-2 infection and should not be used as the sole basis for treatment or other patient management decisions. A negative result may occur with  improper specimen collection/handling, submission of specimen other than nasopharyngeal swab, presence of viral mutation(s) within the areas targeted by this assay, and inadequate number of viral copies(<138 copies/mL). A negative result must be  combined with clinical observations, patient history, and epidemiological information. The expected result is Negative.  Fact Sheet for Patients:  EntrepreneurPulse.com.au  Fact Sheet for Healthcare Providers:  IncredibleEmployment.be  This test is no t yet approved or cleared by the [place] FDA and  has been authorized for detection and/or diagnosis of SARS-CoV-2 by FDA under an Emergency Use Authorization (EUA). This EUA will remain  in effect (meaning this test can be used) for the duration of the COVID-19 declaration under Section 564(b)(1) of the Act, 21 U.S.C.section 360bbb-3(b)(1), unless the authorization is terminated  or revoked sooner.       Influenza A by PCR NEGATIVE NEGATIVE Final   Influenza B by PCR NEGATIVE NEGATIVE Final    Comment: (NOTE) The Xpert Xpress SARS-CoV-2/FLU/RSV plus assay is intended as an aid in the diagnosis of influenza from Nasopharyngeal swab specimens and should not be used as a sole basis for treatment. Nasal washings and aspirates are unacceptable for Xpert Xpress SARS-CoV-2/FLU/RSV testing.  Fact Sheet for Patients: EntrepreneurPulse.com.au  Fact Sheet for Healthcare Providers: IncredibleEmployment.be  This test is not yet approved or cleared by the [place] FDA and has been authorized for detection and/or diagnosis of SARS-CoV-2 by FDA under an Emergency Use Authorization (EUA). This EUA will remain in effect (meaning this test can be used) for the duration of the COVID-19 declaration under Section 564(b)(1) of the Act, 21 U.S.C. section 360bbb-3(b)(1), unless the authorization is terminated or revoked.  Performed at [facility], [address], [place], [place] [address]          Radiology Studies: No results found.      Scheduled Meds:  enoxaparin (LOVENOX) injection  40 mg Subcutaneous Q24H   metoCLOPramide (REGLAN)  injection  10 mg Intravenous Q6H   multivitamin with minerals  1 tablet Oral Daily   pantoprazole  40 mg Oral BID AC   Continuous Infusions:  0.9 % NaCl with KCl 20 mEq / L 125 mL/hr at 03/05/21 0821     LOS: 0 days    Time spent: 25 minutes    Sidney Ace, MD [facility] Pager [phone]  If 7PM-7AM, please contact night-coverage 03/05/2021, 12:42 PM

## 2021-03-06 ENCOUNTER — Inpatient Hospital Stay: Payer: Medicaid Other

## 2021-03-06 ENCOUNTER — Encounter: Payer: Self-pay | Admitting: Internal Medicine

## 2021-03-06 DIAGNOSIS — F509 Eating disorder, unspecified: Secondary | ICD-10-CM

## 2021-03-06 LAB — GLUCOSE, CAPILLARY: Glucose-Capillary: 75 mg/dL (ref 70–99)

## 2021-03-06 MED ORDER — DICYCLOMINE HCL 20 MG PO TABS
20.0000 mg | ORAL_TABLET | Freq: Three times a day (TID) | ORAL | Status: DC
  Administered 2021-03-06 – 2021-03-07 (×5): 20 mg via ORAL
  Filled 2021-03-06 (×7): qty 1

## 2021-03-06 MED ORDER — SODIUM CHLORIDE 0.9 % IV SOLN
2.0000 g | INTRAVENOUS | Status: DC
  Filled 2021-03-06: qty 20

## 2021-03-06 MED ORDER — SIMETHICONE 80 MG PO CHEW
80.0000 mg | CHEWABLE_TABLET | Freq: Four times a day (QID) | ORAL | Status: DC
  Administered 2021-03-06 – 2021-03-07 (×4): 80 mg via ORAL
  Filled 2021-03-06 (×4): qty 1

## 2021-03-06 MED ORDER — IOHEXOL 350 MG/ML SOLN
50.0000 mL | Freq: Once | INTRAVENOUS | Status: AC | PRN
  Administered 2021-03-06: 50 mL via INTRAVENOUS

## 2021-03-06 MED ORDER — OLANZAPINE 2.5 MG PO TABS
2.5000 mg | ORAL_TABLET | Freq: Every day | ORAL | Status: DC
  Administered 2021-03-06: 2.5 mg via ORAL
  Filled 2021-03-06 (×3): qty 1

## 2021-03-06 MED ORDER — SODIUM CHLORIDE 0.9 % IV SOLN
1.0000 g | INTRAVENOUS | Status: DC
  Administered 2021-03-06: 1 g via INTRAVENOUS
  Filled 2021-03-06: qty 10
  Filled 2021-03-06: qty 1

## 2021-03-06 MED ORDER — OXYCODONE HCL 5 MG PO TABS
5.0000 mg | ORAL_TABLET | ORAL | Status: DC | PRN
  Administered 2021-03-06 (×2): 5 mg via ORAL
  Filled 2021-03-06 (×2): qty 1

## 2021-03-06 NOTE — Consult Note (Signed)
[facility] Consult   Reason for Consult: Consult for this 25 year old woman who has been admitted for recurrent uncontrolled vomiting and multiple emergency room visits.  Concern raised once again for eating disorder behavior and psychiatric illness related to her GI complaints Referring Physician:  Priscella Mann Patient Identification: Tiffany Guzman MRN:  [id number] Principal Diagnosis: Atypical eating disorder Diagnosis:  Principal Problem:   Atypical eating disorder Active Problems:   Anxiety   Intractable nausea and vomiting   Total Time spent with patient: 1 hour  Subjective:   Tiffany Guzman is a 25 y.o. female patient admitted with "I kept throwing up".  HPI: Patient seen chart reviewed.  I just spoke with this patient and did a consult 6 days ago which contains a lot of of the history.  Since her last discharge the patient has been back to emergency rooms 3 or 4 separate times all with complaints of intractable nausea and vomiting.  She was admitted to our hospital yesterday with the same complaint.  Patient states that outside the hospital she had been unable to eat or drink any food.  The very thought of food made her feel sick to her stomach and throw up.  Continues to endorse feeling anxious most of the time.  She identifies her theory for why she is throwing up as being "nerves".  Here in the hospital she has been hydrated and has been able to eat some food.  Not clear whether she got any of the psychiatric medicine we had ordered filled or been compliant with it.  Certainly does not seem to have seen anyone for mental health evaluation in those several days.  Patient denies suicidal ideation.  Denies hallucinations.  Denies a belief in being fat and denies any wish to be losing weight.  Hospitalist sounds like they got some collateral information from friends that suggests the patient may be overly focused on weight and food.  Past Psychiatric History: As  noted in previous consult patient has longstanding chronic anxiety.  Seems to have gotten worse in recent years.  Much of it seems to manifest as physical symptoms especially GI complaints.  She has no known history of hospitalization.  Had suicidal thoughts around the age of 44 but none since then.  Patient is a heavy cannabis user and this has been connected on several occasions to her vomiting although the patient claims she has not used any marijuana in over a week.  Risk to Self:   Risk to Others:   Prior Inpatient Therapy:   Prior Outpatient Therapy:    Past Medical History:  Past Medical History:  Diagnosis Date   Acne    Anxiety    Attention deficit hyperactivity disorder (ADHD)    Chronic upper back pain    lower and upper back pain   Depression    Dyspnea    GERD (gastroesophageal reflux disease)    Gestational hypertension 11/14/2017   Migraine with aura    since elementary school   Postoperative nausea and vomiting 02/27/2021    Past Surgical History:  Procedure Laterality Date   CESAREAN SECTION N/A 11/14/2017   Procedure: CESAREAN SECTION;  Surgeon: Will Bonnet, MD;  Location: [facility] ORS;  Service: Obstetrics;  Laterality: N/A;   ESOPHAGOGASTRODUODENOSCOPY Left 02/17/2019   Procedure: ESOPHAGOGASTRODUODENOSCOPY (EGD);  Surgeon: Virgel Manifold, MD;  Location: [facility] ENDOSCOPY;  Service: Endoscopy;  Laterality: Left;   INSERTION OF NON VAGINAL CONTRACEPTIVE DEVICE     LAPAROSCOPIC OVARIAN CYSTECTOMY  Right 02/23/2019   Procedure: LAPAROSCOPIC OVARIAN CYSTECTOMY;  Surgeon: Homero Fellers, MD;  Location: [facility] ORS;  Service: Gynecology;  Laterality: Right;   LAPAROSCOPY N/A 02/25/2021   Procedure: LAPAROSCOPY DIAGNOSTIC;  Surgeon: Will Bonnet, MD;  Location: [facility] ORS;  Service: Gynecology;  Laterality: N/A;   OOPHORECTOMY Left 10/03/2017   Procedure: OOPHORECTOMY;  Surgeon: Homero Fellers, MD;  Location: [facility] ORS;  Service: Gynecology;  Laterality:  Left;   OVARIAN CYST REMOVAL Left 10/03/2017   Procedure: OVARIAN CYSTECTOMY;  Surgeon: Homero Fellers, MD;  Location: [facility] ORS;  Service: Gynecology;  Laterality: Left;   OVARIAN CYST REMOVAL Right 02/25/2021   Procedure: OVARIAN CYSTECTOMY;  Surgeon: Will Bonnet, MD;  Location: [facility] ORS;  Service: Gynecology;  Laterality: Right;   Family History:  Family History  Problem Relation Age of Onset   Hypertension Mother    Hypothyroidism Mother    Depression Mother    Diabetes Maternal Grandmother    Family Psychiatric  History: None reported Social History:  Social History   Substance and Sexual Activity  Alcohol Use No   Alcohol/week: 0.0 standard drinks     Social History   Substance and Sexual Activity  Drug Use Yes   Frequency: 7.0 times per week   Types: Marijuana   Comment: has stopped for 10 days    Social History   Socioeconomic History   Marital status: Single    Spouse name: Not on file   Number of children: Not on file   Years of education: Not on file   Highest education level: Not on file  Occupational History   Not on file  Tobacco Use   Smoking status: Never   Smokeless tobacco: Never  Vaping Use   Vaping Use: Every day   Start date: 09/20/2019   Substances: Nicotine, Flavoring  Substance and Sexual Activity   Alcohol use: No    Alcohol/week: 0.0 standard drinks   Drug use: Yes    Frequency: 7.0 times per week    Types: Marijuana    Comment: has stopped for 10 days   Sexual activity: Yes    Partners: Male    Birth control/protection: None  Other Topics Concern   Not on file  Social History Narrative   Not on file   Social Determinants of Health   Financial Resource Strain: Not on file  Food Insecurity: Not on file  Transportation Needs: Not on file  Physical Activity: Not on file  Stress: Not on file  Social Connections: Not on file   Additional Social History:    Allergies:   Allergies  Allergen Reactions   Buspar  [Buspirone]     Dystonic reaction - arms, legs, jaw "locked up"    Labs:  Results for orders placed or performed during the hospital encounter of 03/04/21 (from the past 48 hour(s))  Resp Panel by RT-PCR (Flu A&B, Covid) Nasopharyngeal Swab     Status: None   Collection Time: 03/04/21  8:58 PM   Specimen: Nasopharyngeal Swab; Nasopharyngeal(NP) swabs in vial transport medium  Result Value Ref Range   SARS Coronavirus 2 by RT PCR NEGATIVE NEGATIVE    Comment: (NOTE) SARS-CoV-2 target nucleic acids are NOT DETECTED.  The SARS-CoV-2 RNA is generally detectable in upper respiratory specimens during the acute phase of infection. The lowest concentration of SARS-CoV-2 viral copies this assay can detect is 138 copies/mL. A negative result does not preclude SARS-Cov-2 infection and should not be used as the sole  basis for treatment or other patient management decisions. A negative result may occur with  improper specimen collection/handling, submission of specimen other than nasopharyngeal swab, presence of viral mutation(s) within the areas targeted by this assay, and inadequate number of viral copies(<138 copies/mL). A negative result must be combined with clinical observations, patient history, and epidemiological information. The expected result is Negative.  Fact Sheet for Patients:  EntrepreneurPulse.com.au  Fact Sheet for Healthcare Providers:  IncredibleEmployment.be  This test is no t yet approved or cleared by the [place] FDA and  has been authorized for detection and/or diagnosis of SARS-CoV-2 by FDA under an Emergency Use Authorization (EUA). This EUA will remain  in effect (meaning this test can be used) for the duration of the COVID-19 declaration under Section 564(b)(1) of the Act, 21 U.S.C.section 360bbb-3(b)(1), unless the authorization is terminated  or revoked sooner.       Influenza A by PCR NEGATIVE NEGATIVE   Influenza  B by PCR NEGATIVE NEGATIVE    Comment: (NOTE) The Xpert Xpress SARS-CoV-2/FLU/RSV plus assay is intended as an aid in the diagnosis of influenza from Nasopharyngeal swab specimens and should not be used as a sole basis for treatment. Nasal washings and aspirates are unacceptable for Xpert Xpress SARS-CoV-2/FLU/RSV testing.  Fact Sheet for Patients: EntrepreneurPulse.com.au  Fact Sheet for Healthcare Providers: IncredibleEmployment.be  This test is not yet approved or cleared by the [place] FDA and has been authorized for detection and/or diagnosis of SARS-CoV-2 by FDA under an Emergency Use Authorization (EUA). This EUA will remain in effect (meaning this test can be used) for the duration of the COVID-19 declaration under Section 564(b)(1) of the Act, 21 U.S.C. section 360bbb-3(b)(1), unless the authorization is terminated or revoked.  Performed at [facility], [address] [place], [place] [id number]   Basic metabolic panel     Status: Abnormal   Collection Time: 03/05/21  6:53 AM  Result Value Ref Range   Sodium 134 (L) 135 - 145 mmol/L   Potassium 3.8 3.5 - 5.1 mmol/L   Chloride 101 98 - 111 mmol/L   CO2 23 22 - 32 mmol/L   Glucose, Bld 70 70 - 99 mg/dL    Comment: Glucose reference range applies only to samples taken after fasting for at least 8 hours.   BUN 12 6 - 20 mg/dL   Creatinine, Ser 0.60 0.44 - 1.00 mg/dL   Calcium 8.7 (L) 8.9 - 10.3 mg/dL   GFR, Estimated >60 >60 mL/min    Comment: (NOTE) Calculated using the CKD-EPI Creatinine Equation (2021)    Anion gap 10 5 - 15    Comment: Performed at [facility], [address] [place], [place] [address]  CBC     Status: Abnormal   Collection Time: 03/05/21  6:53 AM  Result Value Ref Range   WBC 11.1 (H) 4.0 - 10.5 K/uL   RBC 3.85 (L) 3.87 - 5.11 MIL/uL   Hemoglobin 11.3 (L) 12.0 - 15.0 g/dL   HCT 33.0 (L) 36.0 - 46.0 %   MCV 85.7 80.0 - 100.0 fL    MCH 29.4 26.0 - 34.0 pg   MCHC 34.2 30.0 - 36.0 g/dL   RDW 13.2 11.5 - 15.5 %   Platelets 289 150 - 400 K/uL   nRBC 0.0 0.0 - 0.2 %    Comment: Performed at [facility], [address] [place], [place] [address]  HIV Antibody (routine testing w rflx)     Status:  None   Collection Time: 03/05/21  6:53 AM  Result Value Ref Range   HIV Screen 4th Generation wRfx Non Reactive Non Reactive    Comment: Performed at [facility], [place] [address], [place], [place] [address]  Urinalysis, Complete w Microscopic     Status: Abnormal   Collection Time: 03/05/21  8:25 AM  Result Value Ref Range   Color, Urine YELLOW (A) YELLOW   APPearance CLOUDY (A) CLEAR   Specific Gravity, Urine 1.020 1.005 - 1.030   pH 6.0 5.0 - 8.0   Glucose, UA NEGATIVE NEGATIVE mg/dL   Hgb urine dipstick SMALL (A) NEGATIVE   Bilirubin Urine NEGATIVE NEGATIVE   Ketones, ur 80 (A) NEGATIVE mg/dL   Protein, ur NEGATIVE NEGATIVE mg/dL   Nitrite NEGATIVE NEGATIVE   Leukocytes,Ua LARGE (A) NEGATIVE   RBC / HPF 6-10 0 - 5 RBC/hpf   WBC, UA 21-50 0 - 5 WBC/hpf   Bacteria, UA FEW (A) NONE SEEN   Squamous Epithelial / LPF 21-50 0 - 5   Mucus PRESENT     Comment: Performed at [facility], [address] [place], [place] [address]  Glucose, capillary     Status: None   Collection Time: 03/06/21  8:10 AM  Result Value Ref Range   Glucose-Capillary 75 70 - 99 mg/dL    Comment: Glucose reference range applies only to samples taken after fasting for at least 8 hours.   Comment 1 Notify RN     Current Facility-Administered Medications  Medication Dose Route Frequency Provider Last Rate Last Admin   0.9 % NaCl with KCl 20 mEq/ L  infusion   Intravenous Continuous Ralene Muskrat B, MD 125 mL/hr at 03/06/21 1445 New Bag at 03/06/21 1445   acetaminophen (TYLENOL) tablet 650 mg  650 mg Oral Q6H PRN Mansy, Jan A, MD       Or   acetaminophen (TYLENOL) suppository 650 mg  650 mg Rectal Q6H PRN Mansy,  Jan A, MD       cefTRIAXone (ROCEPHIN) 1 g in sodium chloride 0.9 % 100 mL IVPB  1 g Intravenous Q24H Sreenath, Sudheer B, MD 200 mL/hr at 03/06/21 0820 1 g at 03/06/21 0820   dicyclomine (BENTYL) tablet 20 mg  20 mg Oral TID AC & HS Sreenath, Sudheer B, MD   20 mg at 03/06/21 1331   droperidol (INAPSINE) 2.5 MG/ML injection 2.5 mg  2.5 mg Intravenous Q6H PRN Mansy, Jan A, MD   2.5 mg at 03/04/21 2313   enoxaparin (LOVENOX) injection 40 mg  40 mg Subcutaneous Q24H Mansy, Jan A, MD       magnesium hydroxide (MILK OF MAGNESIA) suspension 30 mL  30 mL Oral Daily PRN Mansy, Jan A, MD       metoCLOPramide (REGLAN) injection 10 mg  10 mg Intravenous Q6H Mansy, Jan A, MD   10 mg at 03/06/21 1331   multivitamin with minerals tablet 1 tablet  1 tablet Oral Daily Mansy, Jan A, MD   1 tablet at 03/06/21 [id number]   oxyCODONE (Oxy IR/ROXICODONE) immediate release tablet 5 mg  5 mg Oral Q4H PRN Ralene Muskrat B, MD   5 mg at 03/06/21 1000   pantoprazole (PROTONIX) EC tablet 40 mg  40 mg Oral BID AC Sreenath, Sudheer B, MD   40 mg at 03/06/21 0804   prochlorperazine (COMPAZINE) injection 10 mg  10 mg Intravenous Q6H PRN Mansy, Jan A, MD   10 mg at 03/06/21 0901  simethicone (MYLICON) chewable tablet 80 mg  80 mg Oral QID Ralene Muskrat B, MD   80 mg at 03/06/21 1330   traZODone (DESYREL) tablet 25 mg  25 mg Oral QHS PRN Mansy, Arvella Merles, MD        Musculoskeletal: Strength & Muscle Tone: within normal limits Gait & Station:  Not tested Patient leans: N/A            Psychiatric Specialty Exam:  Presentation  General Appearance:  No data recorded Eye Contact: No data recorded Speech: No data recorded Speech Volume: No data recorded Handedness: No data recorded  Mood and Affect  Mood: No data recorded Affect: No data recorded  Thought Process  Thought Processes: No data recorded Descriptions of Associations:No data recorded Orientation:No data recorded Thought Content:No data  recorded History of Schizophrenia/Schizoaffective disorder:No data recorded Duration of Psychotic Symptoms:No data recorded Hallucinations:No data recorded Ideas of Reference:No data recorded Suicidal Thoughts:No data recorded Homicidal Thoughts:No data recorded  Sensorium  Memory: No data recorded Judgment: No data recorded Insight: No data recorded  Executive Functions  Concentration: No data recorded Attention Span: No data recorded Recall: No data recorded Fund of Knowledge: No data recorded Language: No data recorded  Psychomotor Activity  Psychomotor Activity: No data recorded  Assets  Assets: No data recorded  Sleep  Sleep: No data recorded  Physical Exam: Physical Exam Vitals and nursing note reviewed.  Constitutional:      Appearance: Normal appearance. She is underweight. She is ill-appearing.    HENT:     Head: Normocephalic and atraumatic.     Mouth/Throat:     Pharynx: Oropharynx is clear.  Eyes:     Pupils: Pupils are equal, round, and reactive to light.  Cardiovascular:     Rate and Rhythm: Normal rate and regular rhythm.  Pulmonary:     Effort: Pulmonary effort is normal.     Breath sounds: Normal breath sounds.  Abdominal:     General: Abdomen is flat.     Palpations: Abdomen is soft.  Musculoskeletal:        General: Normal range of motion.  Skin:    General: Skin is warm and dry.  Neurological:     General: No focal deficit present.     Mental Status: Mental status is at baseline. She is lethargic.  Psychiatric:        Attention and Perception: Attention normal.        Mood and Affect: Mood is anxious. Affect is blunt.        Speech: Speech is delayed.        Behavior: Behavior is slowed.        Thought Content: Thought content normal. Thought content does not include homicidal or suicidal ideation.        Cognition and Memory: Cognition normal.   Review of Systems  Constitutional: Negative.   HENT: Negative.    Eyes:  Negative.   Respiratory: Negative.    Cardiovascular: Negative.   Gastrointestinal: Negative.   Musculoskeletal: Negative.   Skin: Negative.   Neurological: Negative.   Psychiatric/Behavioral:  Positive for depression. Negative for hallucinations, memory loss, substance abuse and suicidal ideas. The patient is nervous/anxious and has insomnia.   Blood pressure (!) 136/101, pulse 65, temperature 97.7 F (36.5 C), temperature source Oral, resp. rate 20, height '5\' 4"'$  (1.626 m), weight 45.4 kg, last menstrual period 02/17/2021, SpO2 99 %. Body mass index is 17.16 kg/m.  Treatment Plan Summary: Medication management and Plan  patient continues to have constant nausea and vomiting outside the hospital but also endorses symptoms of severe anxiety related to it.  No physiologic cause has yet been found for the vomiting.  Working Christoper Fabian previously had been cannabis induced although the patient claims that the problem has remained persistent or even worsened since she has cut down and stopped her marijuana use.  I continue to be concerned that part of her illness may be a type of eating disorder.  Talked with the patient about some options and I suggest we try starting a very low dose of olanzapine 2.5 mg at night to start with 4 anxiety and eating behavior.  I am also going to try initiating a referral to the [facility] outpatient clinic especially the eating disorder clinic.  I will download the form and fill it out and send it over.  Patient may need to follow up with this herself.  Not sure how long she will be in the hospital but I will try following up.  Disposition: Supportive therapy provided about ongoing stressors. Discussed crisis plan, support from social network, calling 911, coming to the Emergency Department, and calling Suicide Hotline.  Alethia Berthold, MD 03/06/2021 3:29 PM

## 2021-03-06 NOTE — Progress Notes (Signed)
Recorded a Temperature of 100.4. Administered Tylenol and will recheck temp in one hour.

## 2021-03-06 NOTE — Progress Notes (Addendum)
PROGRESS NOTE    Tiffany Guzman  [id number] DOB: [birth year] DOA: 03/04/2021 PCP: [facility]   Brief Narrative:  25 y.o. Caucasian female with medical history significant for of anxiety, depression, migraine, GERD, ADHD, who underwent laparoscopic right ovarian cystectomy earlier this month and on 01/16/2021 underwent robotic cholecystectomy.  She was admitted on 8/9 for intractable nausea and vomiting and was noted to have leukocytosis.  Her abdominal CT scan revealed postoperative changes and some free fluid.  She has a history of daily cannabis use and has had several ER presentations for cannabinoid hyperemesis syndrome in the past that responded to Haldol.  She denied cannabis use in the postoperative period.  She was seen by our hospitalist team for asymptomatic bradycardia and was advised to follow with cardiology.  GI consult then was obtained and recommendation was to stop marijuana and decreasing and minimizing narcotics when possible.  She was noted to have periportal edema but her liver enzymes were within normal and recommendation was for further follow-up on an outpatient basis possibly with repeat right upper quadrant ultrasound.  She had 3 ER visits in the last 3 days including today and presents today with intractable nausea and vomiting with associated abdominal pain that she describes in the central lower abdomen without localization  Started on a regimen of scheduled Reglan, as needed droperidol, as needed Phenergan.  Starting to have some beneficial effect.   Assessment & Plan:   Active Problems:   Intractable nausea and vomiting  Intractable nausea and vomiting Cyclical vomiting syndrome Possible cannabis hyperemesis syndrome Patient with recurrent ER visits to the same complaints Work-up has been negative in the past Suspect cyclical vomiting or other due to anxiety or cannabis use Also suspect a element of anorexia and poor body  image Patient has had multiple ER visits at multiple facilities without a clear answer Patient provided permission for me to speak with her friend Plan: Continue Reglan 10 mg 3 times daily scheduled As needed IV Phenergan As needed IV droperidol Advised cessation of cannabis use Continue IV fluids Twice daily PPI Full liquid diet, advance as tolerated Check KUB If symptoms persist consider CT abdomen pelvis Also can consider inpatient psychiatric consult  GERD PPI   DVT prophylaxis: SQ Lovenox Code Status: Full Family Communication: Mirna Mires [id number] Disposition Plan: Status is: Inpatient  Remains inpatient appropriate because:Inpatient level of care appropriate due to severity of illness  Dispo: The patient is from: Home              Anticipated d/c is to: Home              Patient currently is not medically stable to d/c.   Difficult to place patient No  Patient with persistent symptoms of intractable nausea and vomiting, poor p.o. intake, food aversion.  Likely a psychiatric element in play.  No physiologic cause for patient's symptoms have been identified.  May need to pursue cross-sectional imaging if symptoms persist.  Can also consider inpatient psychiatric consult.    Level of care: Med-Surg  Consultants:  None  Procedures:  None  Antimicrobials:  None   Subjective: Seen and examined.  Continues to endorse nausea and poor p.o. intake.  Objective: Vitals:   03/05/21 1904 03/05/21 2104 03/06/21 0428 03/06/21 0811  BP: (!) 129/99 132/88 129/85 (!) 136/101  Pulse: 77 71 64 65  Resp: '18 20 18 20  '$ Temp: 99.2 F (37.3 C) 99.2 F (37.3 C) 98.4 F (36.9 C)  97.7 F (36.5 C)  TempSrc: Oral Oral Oral Oral  SpO2: 99% 99% 98% 99%  Weight:      Height:        Intake/Output Summary (Last 24 hours) at 03/06/2021 1318 Last data filed at 03/06/2021 0600 Gross per 24 hour  Intake 1575.05 ml  Output 600 ml  Net 975.05 ml   Filed Weights    03/04/21 1241  Weight: 45.4 kg    Examination:  General exam: No acute distress.  Fatigued Respiratory system: Clear to auscultation. Respiratory effort normal. Cardiovascular system: S1 & S2 heard, RRR. No JVD, murmurs, rubs, gallops or clicks. No pedal edema. Gastrointestinal system: Thin, nontender, nondistended, normal bowel sounds Central nervous system: Alert and oriented. No focal neurological deficits. Extremities: Symmetric 5 x 5 power. Skin: No rashes, lesions or ulcers Psychiatry: Judgement and insight appear normal. Mood & affect appropriate.     Data Reviewed: I have personally reviewed following labs and imaging studies  CBC: Recent Labs  Lab 03/02/21 1858 03/04/21 1247 03/05/21 0653  WBC 15.2* 13.2* 11.1*  HGB 14.4 13.9 11.3*  HCT 41.2 41.4 33.0*  MCV 82.1 84.1 85.7  PLT 340 348 [id number]   Basic Metabolic Panel: Recent Labs  Lab 03/02/21 1858 03/04/21 1247 03/05/21 0653  NA 139 138 134*  K 3.6 3.6 3.8  CL 100 101 101  CO2 '24 24 23  '$ GLUCOSE 109* 99 70  BUN '11 16 12  '$ CREATININE 0.80 0.79 0.60  CALCIUM 9.7 9.6 8.7*   GFR: Estimated Creatinine Clearance: 77 mL/min (by C-G formula based on SCr of 0.6 mg/dL). Liver Function Tests: Recent Labs  Lab 03/02/21 1858 03/04/21 1247  AST 31 20  ALT 28 25  ALKPHOS 55 50  BILITOT 1.0 1.3*  PROT 7.8 8.0  ALBUMIN 5.1* 4.9   Recent Labs  Lab 03/02/21 1858 03/04/21 1247  LIPASE 37 35   No results for input(s): AMMONIA in the last 168 hours. Coagulation Profile: No results for input(s): INR, PROTIME in the last 168 hours. Cardiac Enzymes: No results for input(s): CKTOTAL, CKMB, CKMBINDEX, TROPONINI in the last 168 hours. BNP (last 3 results) No results for input(s): PROBNP in the last 8760 hours. HbA1C: No results for input(s): HGBA1C in the last 72 hours. CBG: Recent Labs  Lab 03/06/21 0810  GLUCAP 75   Lipid Profile: No results for input(s): CHOL, HDL, LDLCALC, TRIG, CHOLHDL, LDLDIRECT in  the last 72 hours. Thyroid Function Tests: No results for input(s): TSH, T4TOTAL, FREET4, T3FREE, THYROIDAB in the last 72 hours. Anemia Panel: No results for input(s): VITAMINB12, FOLATE, FERRITIN, TIBC, IRON, RETICCTPCT in the last 72 hours. Sepsis Labs: No results for input(s): PROCALCITON, LATICACIDVEN in the last 168 hours.  Recent Results (from the past 240 hour(s))  Chlamydia/NGC rt PCR ([facility] only)     Status: None   Collection Time: 02/25/21  4:58 PM   Specimen: Cervical/Vaginal swab  Result Value Ref Range Status   Specimen source GC/Chlam ENDOCERVICAL  Final   Chlamydia Tr NOT DETECTED NOT DETECTED Final   N gonorrhoeae NOT DETECTED NOT DETECTED Final    Comment: (NOTE) This CT/NG assay has not been evaluated in patients with a history of  hysterectomy. Performed at [facility], [address] [place], [place] [address]   Wet prep, genital     Status: Abnormal   Collection Time: 02/25/21  4:58 PM   Specimen: Cervical/Vaginal swab  Result Value Ref Range Status   Yeast Wet Prep HPF POC  NONE SEEN NONE SEEN Final   Trich, Wet Prep NONE SEEN NONE SEEN Final   Clue Cells Wet Prep HPF POC NONE SEEN NONE SEEN Final   WBC, Wet Prep HPF POC MANY (A) NONE SEEN Final   Sperm NONE SEEN  Final    Comment: Performed at [facility], [address], [place], [place] [address]  Resp Panel by RT-PCR (Flu A&B, Covid) Nasopharyngeal Swab     Status: None   Collection Time: 03/04/21  8:58 PM   Specimen: Nasopharyngeal Swab; Nasopharyngeal(NP) swabs in vial transport medium  Result Value Ref Range Status   SARS Coronavirus 2 by RT PCR NEGATIVE NEGATIVE Final    Comment: (NOTE) SARS-CoV-2 target nucleic acids are NOT DETECTED.  The SARS-CoV-2 RNA is generally detectable in upper respiratory specimens during the acute phase of infection. The lowest concentration of SARS-CoV-2 viral copies this assay can detect is 138 copies/mL. A negative result does not preclude  SARS-Cov-2 infection and should not be used as the sole basis for treatment or other patient management decisions. A negative result may occur with  improper specimen collection/handling, submission of specimen other than nasopharyngeal swab, presence of viral mutation(s) within the areas targeted by this assay, and inadequate number of viral copies(<138 copies/mL). A negative result must be combined with clinical observations, patient history, and epidemiological information. The expected result is Negative.  Fact Sheet for Patients:  EntrepreneurPulse.com.au  Fact Sheet for Healthcare Providers:  IncredibleEmployment.be  This test is no t yet approved or cleared by the [place] FDA and  has been authorized for detection and/or diagnosis of SARS-CoV-2 by FDA under an Emergency Use Authorization (EUA). This EUA will remain  in effect (meaning this test can be used) for the duration of the COVID-19 declaration under Section 564(b)(1) of the Act, 21 U.S.C.section 360bbb-3(b)(1), unless the authorization is terminated  or revoked sooner.       Influenza A by PCR NEGATIVE NEGATIVE Final   Influenza B by PCR NEGATIVE NEGATIVE Final    Comment: (NOTE) The Xpert Xpress SARS-CoV-2/FLU/RSV plus assay is intended as an aid in the diagnosis of influenza from Nasopharyngeal swab specimens and should not be used as a sole basis for treatment. Nasal washings and aspirates are unacceptable for Xpert Xpress SARS-CoV-2/FLU/RSV testing.  Fact Sheet for Patients: EntrepreneurPulse.com.au  Fact Sheet for Healthcare Providers: IncredibleEmployment.be  This test is not yet approved or cleared by the [place] FDA and has been authorized for detection and/or diagnosis of SARS-CoV-2 by FDA under an Emergency Use Authorization (EUA). This EUA will remain in effect (meaning this test can be used) for the duration of  the COVID-19 declaration under Section 564(b)(1) of the Act, 21 U.S.C. section 360bbb-3(b)(1), unless the authorization is terminated or revoked.  Performed at [facility], [address] [place], [place] [address]          Radiology Studies: DG Abd 1 View  Result Date: 03/06/2021 CLINICAL DATA:  Abdominal pain EXAM: ABDOMEN - 1 VIEW COMPARISON:  02/15/2019 FINDINGS: Lung bases clear. Normal bowel gas pattern. No bowel dilatation, bowel wall thickening, or evidence of obstruction. Osseous structures unremarkable. No urinary tract calcifications. IMPRESSION: Normal exam. Electronically Signed   By: Lavonia Dana M.D.   On: 03/06/2021 13:07        Scheduled Meds:  dicyclomine  20 mg Oral TID AC & HS   enoxaparin (LOVENOX) injection  40 mg Subcutaneous Q24H   metoCLOPramide (REGLAN) injection  10 mg Intravenous  Q6H   multivitamin with minerals  1 tablet Oral Daily   pantoprazole  40 mg Oral BID AC   simethicone  80 mg Oral QID   Continuous Infusions:  0.9 % NaCl with KCl 20 mEq / L 125 mL/hr at 03/06/21 0430   cefTRIAXone (ROCEPHIN)  IV 1 g (03/06/21 0820)     LOS: 1 day    Time spent: 25 minutes    Sidney Ace, MD [facility] Pager [phone]  If 7PM-7AM, please contact night-coverage 03/06/2021, 1:18 PM

## 2021-03-07 DIAGNOSIS — F509 Eating disorder, unspecified: Secondary | ICD-10-CM | POA: Diagnosis not present

## 2021-03-07 LAB — CBC WITH DIFFERENTIAL/PLATELET
Abs Immature Granulocytes: 0.03 10*3/uL (ref 0.00–0.07)
Basophils Absolute: 0.1 10*3/uL (ref 0.0–0.1)
Basophils Relative: 1 %
Eosinophils Absolute: 0.1 10*3/uL (ref 0.0–0.5)
Eosinophils Relative: 1 %
HCT: 35.9 % — ABNORMAL LOW (ref 36.0–46.0)
Hemoglobin: 12.1 g/dL (ref 12.0–15.0)
Immature Granulocytes: 0 %
Lymphocytes Relative: 19 %
Lymphs Abs: 1.8 10*3/uL (ref 0.7–4.0)
MCH: 28.6 pg (ref 26.0–34.0)
MCHC: 33.7 g/dL (ref 30.0–36.0)
MCV: 84.9 fL (ref 80.0–100.0)
Monocytes Absolute: 0.8 10*3/uL (ref 0.1–1.0)
Monocytes Relative: 8 %
Neutro Abs: 7 10*3/uL (ref 1.7–7.7)
Neutrophils Relative %: 71 %
Platelets: 333 10*3/uL (ref 150–400)
RBC: 4.23 MIL/uL (ref 3.87–5.11)
RDW: 12.9 % (ref 11.5–15.5)
WBC: 9.8 10*3/uL (ref 4.0–10.5)
nRBC: 0 % (ref 0.0–0.2)

## 2021-03-07 LAB — BASIC METABOLIC PANEL
Anion gap: 11 (ref 5–15)
BUN: 8 mg/dL (ref 6–20)
CO2: 22 mmol/L (ref 22–32)
Calcium: 9.2 mg/dL (ref 8.9–10.3)
Chloride: 100 mmol/L (ref 98–111)
Creatinine, Ser: 0.73 mg/dL (ref 0.44–1.00)
GFR, Estimated: >60 mL/min (ref >60)
Glucose, Bld: 69 mg/dL — ABNORMAL LOW (ref 70–99)
Potassium: 4.3 mmol/L (ref 3.5–5.1)
Sodium: 133 mmol/L — ABNORMAL LOW (ref 135–145)

## 2021-03-07 LAB — MAGNESIUM: Magnesium: 2.1 mg/dL (ref 1.7–2.4)

## 2021-03-07 MED ORDER — IBUPROFEN 400 MG PO TABS: 600.0000 mg | ORAL_TABLET | Freq: Three times a day (TID) | ORAL | 0 refills | Status: DC | PRN

## 2021-03-07 MED ORDER — DICYCLOMINE HCL 20 MG PO TABS: 20.0000 mg | ORAL_TABLET | Freq: Three times a day (TID) | ORAL | 0 refills | Status: DC

## 2021-03-07 MED ORDER — OLANZAPINE 2.5 MG PO TABS: 2.5000 mg | ORAL_TABLET | Freq: Every day | ORAL | 0 refills | Status: DC

## 2021-03-07 MED ORDER — PROMETHAZINE HCL 25 MG RE SUPP: 25.0000 mg | Freq: Four times a day (QID) | RECTAL | 0 refills | Status: DC | PRN

## 2021-03-07 MED ORDER — CEPHALEXIN 500 MG PO CAPS
500.0000 mg | ORAL_CAPSULE | Freq: Three times a day (TID) | ORAL | Status: DC
  Administered 2021-03-07: 500 mg via ORAL
  Filled 2021-03-07: qty 1

## 2021-03-07 NOTE — Discharge Summary (Signed)
[facility] [place] [facility] NAME: Tiffany Guzman    MR#:  [id number]  DATE OF BIRTH:  1995-12-27  DATE OF ADMISSION:  03/04/2021 ADMITTING PHYSICIAN: [place] Ace, MD  DATE OF DISCHARGE: 03/07/2021  PRIMARY CARE PHYSICIAN: [facility]    ADMISSION DIAGNOSIS:  Cyclic vomiting syndrome [R11.15] Pelvic pain [R10.2] Intractable nausea and vomiting [R11.2]  DISCHARGE DIAGNOSIS:  Intractable vomiting, recurrent Anxiety-chronic SECONDARY DIAGNOSIS:   Past Medical History:  Diagnosis Date   Acne    Anxiety    Attention deficit hyperactivity disorder (ADHD)    Chronic upper back pain    lower and upper back pain   Depression    Dyspnea    GERD (gastroesophageal reflux disease)    Gestational hypertension 11/14/2017   Migraine with aura    since elementary school   Postoperative nausea and vomiting 02/27/2021    HOSPITAL COURSE:   25 y.o. Caucasian female with medical history significant for of anxiety, depression, migraine, GERD, ADHD, who underwent laparoscopic right ovarian cystectomy earlier this month and on 01/16/2021 underwent robotic cholecystectomy.  She was admitted on 8/9 for intractable nausea and vomiting and was noted to have leukocytosis.  Her abdominal CT scan revealed postoperative changes and some free fluid.  She has a history of daily cannabis use and has had several ER presentations for cannabinoid hyperemesis syndrome in the past that responded to Haldol.  She denied cannabis use in the postoperative period.  Intractable nausea and vomiting Cyclical vomiting syndrome H/o Cannabis use --Patient with recurrent ER visits [facility] and [facility]) to the same complaints --Work-up has been negative in the past --Suspect cyclical vomiting or other due to anxiety or cannabis use (although reports not using sice her GB removal) or Atypical eating DO --Also suspect a element of anorexia and poor body image --Patient  has had multiple ER visits at multiple facilities without a clear answer --cont Twice daily PPI --CT abd/pelvis--unremarkable --LFTS and rest of the labs stable --Seen by Dr Weber Cooks , Psychiatry--recommends further eval at [facility] eating DO clinic (referral faxed and contact numbers given to patient) -- now on zyprexa 2.5 mg qd --8/17--pt did eating some oatmeal, couple strawberries, juice and some blueberry muffin and kept it down. --encourage to take small  and freq meals   GERD PPI   CONSULTS OBTAINED:  Treatment Team:  Clapacs, Madie Reno, MD  DRUG ALLERGIES:   Allergies  Allergen Reactions   Buspar [Buspirone]     Dystonic reaction - arms, legs, jaw "locked up"    DISCHARGE MEDICATIONS:   Allergies as of 03/07/2021       Reactions   Buspar [buspirone]    Dystonic reaction - arms, legs, jaw "locked up"        Medication List     STOP taking these medications    HYDROmorphone 2 MG tablet Commonly known as: DILAUDID       TAKE these medications    acetaminophen 500 MG tablet Commonly known as: TYLENOL Take 2 tablets (1,000 mg total) by mouth every 6 (six) hours as needed for mild pain.   dicyclomine 20 MG tablet Commonly known as: BENTYL Take 1 tablet (20 mg total) by mouth 4 (four) times daily -  before meals and at bedtime.   ibuprofen 400 MG tablet Commonly known as: ADVIL Take 1.5 tablets (600 mg total) by mouth every 8 (eight) hours as needed for headache or mild pain. What changed:  medication strength  when to take this reasons to take this   OLANZapine 2.5 MG tablet Commonly known as: ZYPREXA Take 1 tablet (2.5 mg total) by mouth at bedtime.   ondansetron 4 MG disintegrating tablet Commonly known as: Zofran ODT Take 1 tablet (4 mg total) by mouth every 8 (eight) hours as needed for nausea or vomiting.   pantoprazole 40 MG tablet Commonly known as: Protonix Take 1 tablet (40 mg total) by mouth daily.   promethazine 12.5 MG tablet Commonly  known as: PHENERGAN Take 1 tablet (12.5 mg total) by mouth every 6 (six) hours as needed.   promethazine 25 MG suppository Commonly known as: PHENERGAN Place 1 suppository (25 mg total) rectally every 6 (six) hours as needed for nausea or vomiting.   Womens Multivitamin Tabs Take 1 tablet by mouth daily.        If you experience worsening of your admission symptoms, develop shortness of breath, life threatening emergency, suicidal or homicidal thoughts you must seek medical attention immediately by calling 911 or calling your MD immediately  if symptoms less severe.  You Must read complete instructions/literature along with all the possible adverse reactions/side effects for all the Medicines you take and that have been prescribed to you. Take any new Medicines after you have completely understood and accept all the possible adverse reactions/side effects.   Please note  You were cared for by a hospitalist during your hospital stay. If you have any questions about your discharge medications or the care you received while you were in the hospital after you are discharged, you can call the unit and asked to speak with the hospitalist on call if the hospitalist that took care of you is not available. Once you are discharged, your primary care physician will handle any further medical issues. Please note that NO REFILLS for any discharge medications will be authorized once you are discharged, as it is imperative that you return to your primary care physician (or establish a relationship with a primary care physician if you do not have one) for your aftercare needs so that they can reassess your need for medications and monitor your lab values. Today   SUBJECTIVE  Eating a bit now. No vomiting   VITAL SIGNS:  Blood pressure 114/73, pulse 95, temperature 98.4 F (36.9 C), temperature source Oral, resp. rate 18, height '5\' 4"'$  (1.626 m), weight 45.4 kg, last menstrual period 02/17/2021, SpO2 98  %.  I/O:   Intake/Output Summary (Last 24 hours) at 03/07/2021 1403 Last data filed at 03/07/2021 1003 Gross per 24 hour  Intake 3934.03 ml  Output --  Net 3934.03 ml    PHYSICAL EXAMINATION:  GENERAL:  25 y.o.-year-old patient lying in the bed with no acute distress. Thin LUNGS: Normal breath sounds bilaterally, no wheezing, rales,rhonchi or crepitation. No use of accessory muscles of respiration.  CARDIOVASCULAR: S1, S2 normal. No murmurs, rubs, or gallops.  ABDOMEN: Soft, non-tender, non-distended. Bowel sounds present. No organomegaly or mass.  EXTREMITIES: No pedal edema, cyanosis, or clubbing.  NEUROLOGIC: Cranial nerves II through XII are intact. Muscle strength 5/5 in all extremities. Sensation intact. Gait not checked.  PSYCHIATRIC: The patient is alert and oriented x 3.  SKIN: No obvious rash, lesion, or ulcer.   DATA REVIEW:   CBC  Recent Labs  Lab 03/07/21 0518  WBC 9.8  HGB 12.1  HCT 35.9*  PLT 333    Chemistries  Recent Labs  Lab 03/04/21 1247 03/05/21 0653 03/07/21 0518  NA 138   < >  133*  K 3.6   < > 4.3  CL 101   < > 100  CO2 24   < > 22  GLUCOSE 99   < > 69*  BUN 16   < > 8  CREATININE 0.79   < > 0.73  CALCIUM 9.6   < > 9.2  MG  --   --  2.1  AST 20  --   --   ALT 25  --   --   ALKPHOS 50  --   --   BILITOT 1.3*  --   --    < > = values in this interval not displayed.    Microbiology Results   Recent Results (from the past 240 hour(s))  Chlamydia/NGC rt PCR ([place] only)     Status: None   Collection Time: 02/25/21  4:58 PM   Specimen: Cervical/Vaginal swab  Result Value Ref Range Status   Specimen source GC/Chlam ENDOCERVICAL  Final   Chlamydia Tr NOT DETECTED NOT DETECTED Final   N gonorrhoeae NOT DETECTED NOT DETECTED Final    Comment: (NOTE) This CT/NG assay has not been evaluated in patients with a history of  hysterectomy. Performed at [facility], [address] [place], [place] [address]   Wet prep, genital      Status: Abnormal   Collection Time: 02/25/21  4:58 PM   Specimen: Cervical/Vaginal swab  Result Value Ref Range Status   Yeast Wet Prep HPF POC NONE SEEN NONE SEEN Final   Trich, Wet Prep NONE SEEN NONE SEEN Final   Clue Cells Wet Prep HPF POC NONE SEEN NONE SEEN Final   WBC, Wet Prep HPF POC MANY (A) NONE SEEN Final   Sperm NONE SEEN  Final    Comment: Performed at [facility], [address] [place], [place] [address]  Resp Panel by RT-PCR (Flu A&B, Covid) Nasopharyngeal Swab     Status: None   Collection Time: 03/04/21  8:58 PM   Specimen: Nasopharyngeal Swab; Nasopharyngeal(NP) swabs in vial transport medium  Result Value Ref Range Status   SARS Coronavirus 2 by RT PCR NEGATIVE NEGATIVE Final    Comment: (NOTE) SARS-CoV-2 target nucleic acids are NOT DETECTED.  The SARS-CoV-2 RNA is generally detectable in upper respiratory specimens during the acute phase of infection. The lowest concentration of SARS-CoV-2 viral copies this assay can detect is 138 copies/mL. A negative result does not preclude SARS-Cov-2 infection and should not be used as the sole basis for treatment or other patient management decisions. A negative result may occur with  improper specimen collection/handling, submission of specimen other than nasopharyngeal swab, presence of viral mutation(s) within the areas targeted by this assay, and inadequate number of viral copies(<138 copies/mL). A negative result must be combined with clinical observations, patient history, and epidemiological information. The expected result is Negative.  Fact Sheet for Patients:  EntrepreneurPulse.com.au  Fact Sheet for Healthcare Providers:  IncredibleEmployment.be  This test is no t yet approved or cleared by the [place] FDA and  has been authorized for detection and/or diagnosis of SARS-CoV-2 by FDA under an Emergency Use Authorization (EUA). This EUA will remain  in  effect (meaning this test can be used) for the duration of the COVID-19 declaration under Section 564(b)(1) of the Act, 21 U.S.C.section 360bbb-3(b)(1), unless the authorization is terminated  or revoked sooner.       Influenza A by PCR NEGATIVE NEGATIVE Final   Influenza B by PCR NEGATIVE NEGATIVE Final  Comment: (NOTE) The Xpert Xpress SARS-CoV-2/FLU/RSV plus assay is intended as an aid in the diagnosis of influenza from Nasopharyngeal swab specimens and should not be used as a sole basis for treatment. Nasal washings and aspirates are unacceptable for Xpert Xpress SARS-CoV-2/FLU/RSV testing.  Fact Sheet for Patients: EntrepreneurPulse.com.au  Fact Sheet for Healthcare Providers: IncredibleEmployment.be  This test is not yet approved or cleared by the [place] FDA and has been authorized for detection and/or diagnosis of SARS-CoV-2 by FDA under an Emergency Use Authorization (EUA). This EUA will remain in effect (meaning this test can be used) for the duration of the COVID-19 declaration under Section 564(b)(1) of the Act, 21 U.S.C. section 360bbb-3(b)(1), unless the authorization is terminated or revoked.  Performed at [facility], [address] [place], [place] [address]     RADIOLOGY:  Bea Graff 1 View  Result Date: 03/06/2021 CLINICAL DATA:  Abdominal pain EXAM: ABDOMEN - 1 VIEW COMPARISON:  02/15/2019 FINDINGS: Lung bases clear. Normal bowel gas pattern. No bowel dilatation, bowel wall thickening, or evidence of obstruction. Osseous structures unremarkable. No urinary tract calcifications. IMPRESSION: Normal exam. Electronically Signed   By: Lavonia Dana M.D.   On: 03/06/2021 13:07   CT ABDOMEN PELVIS W CONTRAST  Result Date: 03/06/2021 CLINICAL DATA:  Abdominal pain and fevers with nausea and vomiting, initial encounter EXAM: CT ABDOMEN AND PELVIS WITH CONTRAST TECHNIQUE: Multidetector CT imaging of the abdomen and  pelvis was performed using the standard protocol following bolus administration of intravenous contrast. CONTRAST:  30m OMNIPAQUE IOHEXOL 350 MG/ML SOLN COMPARISON:  02/27/2021 FINDINGS: Lower chest: No acute abnormality. Hepatobiliary: Gallbladder has been surgically removed. Liver demonstrates a focal area of decreased attenuation along the falciform ligament consistent with focal fatty infiltration. Common bile duct is prominent consistent with the post cholecystectomy state. Pancreas: Pancreas is within normal limits. Spleen: Normal in size without focal abnormality. Adrenals/Urinary Tract: Adrenal glands are within normal limits. Kidneys demonstrate a normal enhancement pattern. No renal calculi or obstructive changes are seen. Normal excretion is noted on delayed images. The bladder is well distended. Stomach/Bowel: The appendix is not well visualized although no inflammatory changes are noted to suggest appendicitis. Colon and small bowel show no inflammatory change. Stomach is mildly fluid filled. Vascular/Lymphatic: No significant vascular findings are present. No enlarged abdominal or pelvic lymph nodes. Reproductive: Uterus is within normal limits. Right ovary is stable in appearance when compared with the prior exam. Previously seen postoperative changes in the region of the right adnexa have resolved. The left adnexa is not well appreciated consistent with prior surgical history. Other: Minimal free fluid is noted within the pelvis although significantly improved from the prior exam. This is consistent with progressive postoperative change. Musculoskeletal: No acute or significant osseous findings. IMPRESSION: No acute abnormality is noted to correspond with the given clinical history. Electronically Signed   By: MInez CatalinaM.D.   On: 03/06/2021 20:03     CODE STATUS:     Code Status Orders  (From admission, onward)           Start     Ordered   03/04/21 2110  Full code  Continuous         03/04/21 2113           Code Status History     Date Active Date Inactive Code Status Order ID Comments User Context   02/27/2021 0858 03/01/2021 1547 Full Code [id number] SMalachy Mood MD Inpatient   02/26/2021 [id number]/02/2021  1640 Full Code [id number]  Will Bonnet, MD Inpatient   02/23/2019 0753 02/23/2019 1716 Full Code [id number]  Homero Fellers, MD Inpatient   02/15/2019 1624 02/17/2019 1826 Full Code [id number]  Saundra Shelling, MD ED   02/02/2019 0526 02/03/2019 1916 Full Code [id number]  Harrie Foreman, MD Inpatient   11/15/2017 1743 11/18/2017 1908 Full Code [id number]  Dalia Heading, [place] Inpatient   11/14/2017 1326 11/15/2017 1743 Full Code [id number]  Will Bonnet, MD Inpatient   10/03/2017 1401 10/07/2017 2045 Full Code [id number]  Homero Fellers, MD Inpatient   10/01/2017 1947 10/03/2017 1400 Full Code [id number]  Gae Dry, MD Inpatient   09/04/2017 2242 09/05/2017 1741 Full Code [id number]  Malachy Mood, MD Inpatient   09/04/2017 1724 09/04/2017 2242 Full Code [id number]  Malachy Mood, MD Inpatient        TOTAL TIME TAKING CARE OF THIS PATIENT: 35 minutes.    Fritzi Mandes M.D  [facility]    CC: Primary care physician; [place]

## 2021-03-07 NOTE — Consult Note (Signed)
[facility] Consult   Reason for Consult: Follow-up consult for 25 year old woman with history of recurrent unexplained nausea and vomiting and anxiety symptoms Referring Physician: Posey Pronto Patient Identification: Tiffany Guzman MRN:  [id number] Principal Diagnosis: Atypical eating disorder Diagnosis:  Principal Problem:   Atypical eating disorder Active Problems:   Anxiety   Intractable nausea and vomiting   Total Time spent with patient: 30 minutes  Subjective:   Tiffany Guzman is a 25 y.o. female patient admitted with "I could not keep anything down".  HPI: Follow-up today.  See yesterday's consult.  25 year old woman whose recurrent episodes of vomiting and nausea and inability to keep food down have been worsening.  She has been coming into emergency rooms back to back without any sustained relief.  Continuing to have symptoms of anxiety dysphoria low energy.  Developing fear of even trying to eat.  Patient today is being discharged having kept some food down today.  Her affect is still flat down and depressed.  Denies suicidal ideation.  Denies hallucinations.  Still feeling very nervous about going home.  Patient was started on low-dose Zyprexa last night which she appears to have tolerated adequately.  Past Psychiatric History: Has been treated for anxiety and depression in the past but does not seem to have had a regular mental health follow-up.  No history of past hospitalizations.  BuSpar had been listed in her chart as an allergy which I believe is incorrect.  See a previous note for my explanation.  Risk to Self:   Risk to Others:   Prior Inpatient Therapy:   Prior Outpatient Therapy:    Past Medical History:  Past Medical History:  Diagnosis Date   Acne    Anxiety    Attention deficit hyperactivity disorder (ADHD)    Chronic upper back pain    lower and upper back pain   Depression    Dyspnea    GERD (gastroesophageal reflux disease)     Gestational hypertension 11/14/2017   Migraine with aura    since elementary school   Postoperative nausea and vomiting 02/27/2021    Past Surgical History:  Procedure Laterality Date   CESAREAN SECTION N/A 11/14/2017   Procedure: CESAREAN SECTION;  Surgeon: Will Bonnet, MD;  Location: [facility] ORS;  Service: Obstetrics;  Laterality: N/A;   ESOPHAGOGASTRODUODENOSCOPY Left 02/17/2019   Procedure: ESOPHAGOGASTRODUODENOSCOPY (EGD);  Surgeon: Virgel Manifold, MD;  Location: [facility] ENDOSCOPY;  Service: Endoscopy;  Laterality: Left;   INSERTION OF NON VAGINAL CONTRACEPTIVE DEVICE     LAPAROSCOPIC OVARIAN CYSTECTOMY Right 02/23/2019   Procedure: LAPAROSCOPIC OVARIAN CYSTECTOMY;  Surgeon: Homero Fellers, MD;  Location: [facility] ORS;  Service: Gynecology;  Laterality: Right;   LAPAROSCOPY N/A 02/25/2021   Procedure: LAPAROSCOPY DIAGNOSTIC;  Surgeon: Will Bonnet, MD;  Location: [facility] ORS;  Service: Gynecology;  Laterality: N/A;   OOPHORECTOMY Left 10/03/2017   Procedure: OOPHORECTOMY;  Surgeon: Homero Fellers, MD;  Location: [facility] ORS;  Service: Gynecology;  Laterality: Left;   OVARIAN CYST REMOVAL Left 10/03/2017   Procedure: OVARIAN CYSTECTOMY;  Surgeon: Homero Fellers, MD;  Location: [facility] ORS;  Service: Gynecology;  Laterality: Left;   OVARIAN CYST REMOVAL Right 02/25/2021   Procedure: OVARIAN CYSTECTOMY;  Surgeon: Will Bonnet, MD;  Location: [facility] ORS;  Service: Gynecology;  Laterality: Right;   Family History:  Family History  Problem Relation Age of Onset   Hypertension Mother    Hypothyroidism Mother    Depression Mother    Diabetes  Maternal Grandmother    Family Psychiatric  History: See previous.  By her history her mother has substance abuse and behavior problems possibly mood problems. Social History:  Social History   Substance and Sexual Activity  Alcohol Use No   Alcohol/week: 0.0 standard drinks     Social History   Substance and Sexual  Activity  Drug Use Yes   Frequency: 7.0 times per week   Types: Marijuana   Comment: has stopped for 10 days    Social History   Socioeconomic History   Marital status: Single    Spouse name: Not on file   Number of children: Not on file   Years of education: Not on file   Highest education level: Not on file  Occupational History   Not on file  Tobacco Use   Smoking status: Never   Smokeless tobacco: Never  Vaping Use   Vaping Use: Every day   Start date: 09/20/2019   Substances: Nicotine, Flavoring  Substance and Sexual Activity   Alcohol use: No    Alcohol/week: 0.0 standard drinks   Drug use: Yes    Frequency: 7.0 times per week    Types: Marijuana    Comment: has stopped for 10 days   Sexual activity: Yes    Partners: Male    Birth control/protection: None  Other Topics Concern   Not on file  Social History Narrative   Not on file   Social Determinants of Health   Financial Resource Strain: Not on file  Food Insecurity: Not on file  Transportation Needs: Not on file  Physical Activity: Not on file  Stress: Not on file  Social Connections: Not on file   Additional Social History:    Allergies:   Allergies  Allergen Reactions   Buspar [Buspirone]     Dystonic reaction - arms, legs, jaw "locked up"    Labs:  Results for orders placed or performed during the hospital encounter of 03/04/21 (from the past 48 hour(s))  Glucose, capillary     Status: None   Collection Time: 03/06/21  8:10 AM  Result Value Ref Range   Glucose-Capillary 75 70 - 99 mg/dL    Comment: Glucose reference range applies only to samples taken after fasting for at least 8 hours.   Comment 1 Notify RN   CBC with Differential/Platelet     Status: Abnormal   Collection Time: 03/07/21  5:18 AM  Result Value Ref Range   WBC 9.8 4.0 - 10.5 K/uL   RBC 4.23 3.87 - 5.11 MIL/uL   Hemoglobin 12.1 12.0 - 15.0 g/dL   HCT 35.9 (L) 36.0 - 46.0 %   MCV 84.9 80.0 - 100.0 fL   MCH 28.6 26.0 -  34.0 pg   MCHC 33.7 30.0 - 36.0 g/dL   RDW 12.9 11.5 - 15.5 %   Platelets 333 150 - 400 K/uL   nRBC 0.0 0.0 - 0.2 %   Neutrophils Relative % 71 %   Neutro Abs 7.0 1.7 - 7.7 K/uL   Lymphocytes Relative 19 %   Lymphs Abs 1.8 0.7 - 4.0 K/uL   Monocytes Relative 8 %   Monocytes Absolute 0.8 0.1 - 1.0 K/uL   Eosinophils Relative 1 %   Eosinophils Absolute 0.1 0.0 - 0.5 K/uL   Basophils Relative 1 %   Basophils Absolute 0.1 0.0 - 0.1 K/uL   Immature Granulocytes 0 %   Abs Immature Granulocytes 0.03 0.00 - 0.07 K/uL    Comment:  Performed at [facility], [address] [place], [place] [id number]  Basic metabolic panel     Status: Abnormal   Collection Time: 03/07/21  5:18 AM  Result Value Ref Range   Sodium 133 (L) 135 - 145 mmol/L   Potassium 4.3 3.5 - 5.1 mmol/L   Chloride 100 98 - 111 mmol/L   CO2 22 22 - 32 mmol/L   Glucose, Bld 69 (L) 70 - 99 mg/dL    Comment: Glucose reference range applies only to samples taken after fasting for at least 8 hours.   BUN 8 6 - 20 mg/dL   Creatinine, Ser 0.73 0.44 - 1.00 mg/dL   Calcium 9.2 8.9 - 10.3 mg/dL   GFR, Estimated >60 >60 mL/min    Comment: (NOTE) Calculated using the CKD-EPI Creatinine Equation (2021)    Anion gap 11 5 - 15    Comment: Performed at [facility], [address] [place], [place] [address]  Magnesium     Status: None   Collection Time: 03/07/21  5:18 AM  Result Value Ref Range   Magnesium 2.1 1.7 - 2.4 mg/dL    Comment: Performed at [facility], [address] [place], [place] [address]    Current Facility-Administered Medications  Medication Dose Route Frequency Provider Last Rate Last Admin   acetaminophen (TYLENOL) tablet 650 mg  650 mg Oral Q6H PRN Mansy, Jan A, MD   650 mg at 03/06/21 1632   Or   acetaminophen (TYLENOL) suppository 650 mg  650 mg Rectal Q6H PRN Mansy, Jan A, MD       cephALEXin (KEFLEX) capsule 500 mg  500 mg Oral Q8H Fritzi Mandes, MD   500 mg at 03/07/21 1134    dicyclomine (BENTYL) tablet 20 mg  20 mg Oral TID AC & HS Sreenath, Sudheer B, MD   20 mg at 03/07/21 1135   droperidol (INAPSINE) 2.5 MG/ML injection 2.5 mg  2.5 mg Intravenous Q6H PRN Mansy, Jan A, MD   2.5 mg at 03/04/21 2313   enoxaparin (LOVENOX) injection 40 mg  40 mg Subcutaneous Q24H Mansy, Jan A, MD       magnesium hydroxide (MILK OF MAGNESIA) suspension 30 mL  30 mL Oral Daily PRN Mansy, Jan A, MD       multivitamin with minerals tablet 1 tablet  1 tablet Oral Daily Mansy, Jan A, MD   1 tablet at 03/07/21 0858   OLANZapine (ZYPREXA) tablet 2.5 mg  2.5 mg Oral QHS Jafet Wissing T, MD   2.5 mg at 03/06/21 2229   oxyCODONE (Oxy IR/ROXICODONE) immediate release tablet 5 mg  5 mg Oral Q4H PRN Ralene Muskrat B, MD   5 mg at 03/06/21 1636   pantoprazole (PROTONIX) EC tablet 40 mg  40 mg Oral BID AC Sreenath, Sudheer B, MD   40 mg at 03/07/21 0858   prochlorperazine (COMPAZINE) injection 10 mg  10 mg Intravenous Q6H PRN Mansy, Jan A, MD   10 mg at 03/07/21 [id number]   simethicone (MYLICON) chewable tablet 80 mg  80 mg Oral QID Ralene Muskrat B, MD   80 mg at 03/07/21 0858   traZODone (DESYREL) tablet 25 mg  25 mg Oral QHS PRN Mansy, Arvella Merles, MD        Musculoskeletal: Strength & Muscle Tone: within normal limits Gait & Station: normal Patient leans: N/A            Psychiatric Specialty Exam:  Presentation  General Appearance:  No data recorded Eye Contact: No data recorded Speech: No data recorded Speech Volume: No data recorded Handedness: No data recorded  Mood and Affect  Mood: No data recorded Affect: No data recorded  Thought Process  Thought Processes: No data recorded Descriptions of Associations:No data recorded Orientation:No data recorded Thought Content:No data recorded History of Schizophrenia/Schizoaffective disorder:No data recorded Duration of Psychotic Symptoms:No data recorded Hallucinations:No data recorded Ideas of Reference:No data  recorded Suicidal Thoughts:No data recorded Homicidal Thoughts:No data recorded  Sensorium  Memory: No data recorded Judgment: No data recorded Insight: No data recorded  Executive Functions  Concentration: No data recorded Attention Span: No data recorded Recall: No data recorded Fund of Knowledge: No data recorded Language: No data recorded  Psychomotor Activity  Psychomotor Activity: No data recorded  Assets  Assets: No data recorded  Sleep  Sleep: No data recorded  Physical Exam: Physical Exam Vitals and nursing note reviewed.  Constitutional:      Appearance: Normal appearance. She is underweight.  HENT:     Head: Normocephalic and atraumatic.     Mouth/Throat:     Pharynx: Oropharynx is clear.  Eyes:     Pupils: Pupils are equal, round, and reactive to light.  Cardiovascular:     Rate and Rhythm: Normal rate and regular rhythm.  Pulmonary:     Effort: Pulmonary effort is normal.     Breath sounds: Normal breath sounds.  Abdominal:     General: Abdomen is flat.     Palpations: Abdomen is soft.  Musculoskeletal:        General: Normal range of motion.  Skin:    General: Skin is warm and dry.  Neurological:     General: No focal deficit present.     Mental Status: She is alert. Mental status is at baseline.  Psychiatric:        Mood and Affect: Mood normal. Affect is blunt.        Speech: Speech is delayed.        Behavior: Behavior is slowed.        Thought Content: Thought content normal. Thought content does not include suicidal ideation.   Review of Systems  Constitutional: Negative.   HENT: Negative.    Eyes: Negative.   Respiratory: Negative.    Cardiovascular: Negative.   Gastrointestinal: Negative.   Musculoskeletal: Negative.   Skin: Negative.   Neurological: Negative.   Psychiatric/Behavioral:  Negative for substance abuse and suicidal ideas. The patient is nervous/anxious.   Blood pressure 114/73, pulse 95, temperature 98.4  F (36.9 C), temperature source Oral, resp. rate 18, height '5\' 4"'$  (1.626 m), weight 45.4 kg, last menstrual period 02/17/2021, SpO2 98 %. Body mass index is 17.16 kg/m.  Treatment Plan Summary: Medication management and Plan patient is being discharged from the medical service today and does not have criteria to indicate admission to psychiatric service.  Yesterday I filed a full referral by fax to the [facility] for excellence and eating disorders.  I have given the patient their phone number and requested she follow-up with him.  She has also been given the follow-up numbers to [place], [employer], and [facility] all local agencies doing mental health care.  She will be given a prescription for the low-dose olanzapine at discharge.  Strongly encouraged her to pursue follow-up as this recurrent problem does not appear to be getting better otherwise.  Disposition: Supportive therapy provided about ongoing stressors. Discussed crisis plan, support from social network, calling 911, coming  to the Emergency Department, and calling Suicide Hotline.  Alethia Berthold, MD 03/07/2021 1:57 PM

## 2021-03-07 NOTE — Progress Notes (Addendum)
Initial Nutrition Assessment  DOCUMENTATION CODES:   Severe malnutrition in context of acute illness/injury  INTERVENTION:   Recommend Ensure Enlive po TID, each supplement provides 350 kcal and 20 grams of protein  Recommend MVI po daily   Pt at high refeed risk; recommend monitor potassium, magnesium and phosphorus labs daily until stable  NUTRITION DIAGNOSIS:   Severe Malnutrition related to acute illness as evidenced by 11 percent weight loss in 3 months, mild fat depletion, moderate muscle depletion.  GOAL:   Patient will meet greater than or equal to 90% of their needs  MONITOR:   PO intake, Supplement acceptance, Labs, Weight trends, Skin, I & O's  REASON FOR ASSESSMENT:   Consult Assessment of nutrition requirement/status  ASSESSMENT:   25 y.o. caucasian female with medical history significant for anxiety, depression, pyelonephritis, migraine, GERD, ADHD, marijuana abuse, s/p L oopherectomy and cyst removal 09/2017, s/p laparoscopic right ovarian cystectomy in 02/2019 and on 02/25/2021 and s/p robotic cholecystectomy 01/16/21 who is admitted with intractable nausea and vomiting.  Met with pt in room today. Pt is with flat affect and seems depressed despite being discharged today. Pt reports chronic intermittent intractable nausea and vomiting for the past 5 years. Pt reports that there are times when she is fine and is able to eat without issue but about 3-4 times a year, she has episodes in which she has intractable nausea, vomiting and poor oral intake. Pt reports that nausea and vomiting is random and does not particularly occur after eating meals or after any particular activities. Pt reports nausea and vomiting throughout her pregnancy as well. Pt denies any diarrhea but does report some abdominal pain at times. Pt does have a prior diagnosis of cannabinoid hyperemesis; pt reports that she has not smoked any marijuana since last week when she started feeling sick. Pt has  been seen by GI and is s/p EGD in 01/2019 with biopsies that were negative for inflammatory diease and H. Pylori. Pt denies any intention of weight loss or purging. Per chart, it sounds like some friends and family members had concerns that pt may have an obsession with food and weight. Per chart, pt is down 12lbs(11%) over the past 3 months; this is significant. Pt reports her UBW is ~120-130lbs. Pt reports that she has been using protein powder at home and taking a daily MVI. RD discussed with pt ways to increase her calorie and protein intake at times when she is having nausea and difficulty with keeping food down. MD with concerns that patient may have an eating disorder. Pt seen by psychiatry and reported at that time that even the thought of food made her feel sick to her stomach and makes her want to throw up. Pt has been referred to [facility] for excellence and eating disorders. Pt reports that she was able to keep down some food brought in from outside the hospital today. Pt to discharge today. RD will add supplements if pt does not discharge. Pt is likely at high refeed risk.    Medications reviewed and include: cephalexin, lovenox, MVI, protonix, simethicone  Labs reviewed: Na 133(L), Mg 2.1 wnl  NUTRITION - FOCUSED PHYSICAL EXAM:  Flowsheet Row Most Recent Value  Orbital Region No depletion  Upper Arm Region Mild depletion  Thoracic and Lumbar Region Mild depletion  Buccal Region Mild depletion  Temple Region No depletion  Clavicle Bone Region Mild depletion  Clavicle and Acromion Bone Region Mild depletion  Scapular Bone Region Mild depletion  Dorsal Hand No depletion  Patellar Region Moderate depletion  Anterior Thigh Region Moderate depletion  Posterior Calf Region Moderate depletion  Edema (RD Assessment) None  Hair Reviewed  Eyes Reviewed  Mouth Reviewed  Skin Reviewed  Nails Reviewed   Diet Order:   Diet Order             Diet regular Room service appropriate? Yes;  Fluid consistency: Thin  Diet effective now           Diet general                  EDUCATION NEEDS:   Education needs have been addressed  Skin:  Skin Assessment: Reviewed RN Assessment (closed incision abomen)  Last BM:  pta  Height:   Ht Readings from Last 1 Encounters:  03/04/21 5' 4"  (1.626 m)    Weight:   Wt Readings from Last 1 Encounters:  03/04/21 45.4 kg    Ideal Body Weight:  54.5 kg  BMI:  Body mass index is 17.16 kg/m.  Estimated Nutritional Needs:   Kcal:  1600-1800kcal/day  Protein:  80-90g/day  Fluid:  1.4-1.6L/day  Koleen Distance MS, RD, LDN Please refer to [facility] for RD and/or RD on-call/weekend/after hours pager

## 2021-03-07 NOTE — Discharge Instructions (Signed)
Pt advised to call and make appt to be seen at [facility] eating DO clinic

## 2021-03-08 ENCOUNTER — Encounter: Payer: Self-pay | Admitting: Obstetrics and Gynecology

## 2021-03-08 ENCOUNTER — Ambulatory Visit (INDEPENDENT_AMBULATORY_CARE_PROVIDER_SITE_OTHER): Payer: Medicaid Other | Admitting: Obstetrics and Gynecology

## 2021-03-08 ENCOUNTER — Telehealth: Payer: Self-pay

## 2021-03-08 ENCOUNTER — Other Ambulatory Visit: Payer: Self-pay

## 2021-03-08 VITALS — BP 112/77 | Ht 64.0 in | Wt 92.0 lb

## 2021-03-08 DIAGNOSIS — N83201 Unspecified ovarian cyst, right side: Secondary | ICD-10-CM

## 2021-03-08 NOTE — Progress Notes (Signed)
   Postoperative Follow-up Patient presents post op from operative laparoscopy, right ovarian cystectomy  11  days ago for right lower quadrant pain with right ovarian cyst.  Subjective: Patient reports marked improvement in her preop symptoms. Eating a regular diet with difficulty. She has been admitted to the hospital with a chronic emesis syndrome, unrelated to her surgery. The patient is not having any pain.  Activity: more or less nromal.  She denies fever and chills.   Objective: Vital Signs: BP 112/77   Ht '5\' 4"'$  (1.626 m)   Wt 92 lb (41.7 kg)   LMP 02/19/2021 (Approximate)   BMI 15.79 kg/m  Physical Exam Constitutional:      General: She is not in acute distress.    Appearance: Normal appearance. She is well-developed.  HENT:     Head: Normocephalic and atraumatic.  Eyes:     General: No scleral icterus.    Conjunctiva/sclera: Conjunctivae normal.  Cardiovascular:     Rate and Rhythm: Normal rate and regular rhythm.     Heart sounds: No murmur heard.   No friction rub. No gallop.  Pulmonary:     Effort: Pulmonary effort is normal. No respiratory distress.     Breath sounds: Normal breath sounds. No wheezing or rales.  Abdominal:     General: Bowel sounds are normal. There is no distension.     Palpations: Abdomen is soft. There is no mass.     Tenderness: There is no abdominal tenderness. There is no guarding or rebound.     Comments: Incisions: without erythema, induration, warmth, and tenderness. They are clean, dry, and intact.     Musculoskeletal:        General: Normal range of motion.     Cervical back: Normal range of motion and neck supple.  Neurological:     General: No focal deficit present.     Mental Status: She is alert and oriented to person, place, and time.     Cranial Nerves: No cranial nerve deficit.  Skin:    General: Skin is warm and dry.     Findings: No erythema.  Psychiatric:        Mood and Affect: Mood normal.        Behavior: Behavior  normal.        Judgment: Judgment normal.     Assessment: 25 y.o. s/p above surgery progressing well  Plan: Patient has done well after surgery with no apparent complications.  I have discussed the post-operative course to date, and the expected progress moving forward.  The patient understands what complications to be concerned about.  I will see the patient in routine follow up, or sooner if needed.    Activity plan: No heavy lifting for 5 weeks. She has had 3 surgeries for ovarian cysts in as many years.  Will follow her remaining ovary to try to identify early cyst formation (she has had 2 dermoid cysts). If she demonstrates stability, can move to yearly ultrasounds.   Prentice Docker, MD  03/08/2021, 1:21 PM

## 2021-03-08 NOTE — Telephone Encounter (Signed)
Transition Care Management Unsuccessful Follow-up Telephone Call  Date of discharge and from where:  03/07/2021-[facility]  Attempts:  1st Attempt  Reason for unsuccessful TCM follow-up call:  Unable to leave message

## 2021-03-09 NOTE — Telephone Encounter (Signed)
Transition Care Management Follow-up Telephone Call Date of discharge and from where: 03/07/2021-[facility] How have you been since you were released from the hospital? Patient stated she is feeling better  Any questions or concerns? No  Items Reviewed: Did the pt receive and understand the discharge instructions provided? Yes  Medications obtained and verified? Yes  Other? No  Any new allergies since your discharge? No  Dietary orders reviewed? N/A Do you have support at home? Yes   Home Care and Equipment/Supplies: Were home health services ordered? not applicable If so, what is the name of the agency? N/A  Has the agency set up a time to come to the patient's home? not applicable Were any new equipment or medical supplies ordered?  No What is the name of the medical supply agency? N/A Were you able to get the supplies/equipment? not applicable Do you have any questions related to the use of the equipment or supplies? No  Functional Questionnaire: (I = Independent and D = Dependent) ADLs: I  Bathing/Dressing- I  Meal Prep- I  Eating- I  Maintaining continence- I  Transferring/Ambulation- I  Managing Meds- I  Follow up appointments reviewed:  PCP Hospital f/u appt confirmed? Yes  Scheduled to see Regino Schultze on 03/15/2021 @ 2:00 PM. [facility] f/u appt confirmed? No   Are transportation arrangements needed? No  If their condition worsens, is the pt aware to call PCP or go to the Emergency Dept.? Yes Was the patient provided with contact information for the PCP's office or ED? Yes Was to pt encouraged to call back with questions or concerns? Yes

## 2021-03-12 ENCOUNTER — Other Ambulatory Visit: Payer: Medicaid Other

## 2021-03-13 ENCOUNTER — Inpatient Hospital Stay: Payer: Medicaid Other | Admitting: Family Medicine

## 2021-03-15 ENCOUNTER — Other Ambulatory Visit: Payer: Self-pay

## 2021-03-15 ENCOUNTER — Ambulatory Visit (INDEPENDENT_AMBULATORY_CARE_PROVIDER_SITE_OTHER): Payer: Medicaid Other | Admitting: Unknown Physician Specialty

## 2021-03-15 ENCOUNTER — Encounter: Payer: Self-pay | Admitting: Unknown Physician Specialty

## 2021-03-15 DIAGNOSIS — K589 Irritable bowel syndrome without diarrhea: Secondary | ICD-10-CM

## 2021-03-15 DIAGNOSIS — R112 Nausea with vomiting, unspecified: Secondary | ICD-10-CM

## 2021-03-15 DIAGNOSIS — F419 Anxiety disorder, unspecified: Secondary | ICD-10-CM

## 2021-03-15 DIAGNOSIS — R001 Bradycardia, unspecified: Secondary | ICD-10-CM

## 2021-03-15 DIAGNOSIS — F509 Eating disorder, unspecified: Secondary | ICD-10-CM

## 2021-03-15 MED ORDER — OLANZAPINE 2.5 MG PO TABS
2.5000 mg | ORAL_TABLET | Freq: Every day | ORAL | 0 refills | Status: DC
Start: 2021-03-15 — End: 2021-05-30

## 2021-03-15 NOTE — Assessment & Plan Note (Signed)
Doing well with Olanaepine 2.5 mg. Refill today but will ask [facility] to take over

## 2021-03-15 NOTE — Assessment & Plan Note (Signed)
Doing well with Bentyl

## 2021-03-15 NOTE — Assessment & Plan Note (Signed)
Seeing cardiology due to variable pulse.  High today but low in hospital

## 2021-03-15 NOTE — Assessment & Plan Note (Signed)
Resolved.  Pt aware that marijuana use has caused problems.

## 2021-03-15 NOTE — Assessment & Plan Note (Signed)
Appt set up with [facility]. Feels she is eating more

## 2021-03-15 NOTE — Progress Notes (Signed)
BP 130/82   Pulse (!) 108   Temp 98.6 F (37 C) (Oral)   Resp 16   Ht '5\' 4"'$  (1.626 m)   Wt 98 lb 1.6 oz (44.5 kg)   LMP 02/19/2021 (Approximate)   SpO2 99%   BMI 16.84 kg/m    Subjective:    Patient ID: Tiffany Guzman, female    DOB: February 26, 1996, 25 y.o.   MRN: [id number]  HPI: Tiffany Guzman is a 25 y.o. female  Chief Complaint  Patient presents with   Hospitalization Follow-up   Pt hospitalized 8/14-8/17 for abdominal pain and intractable vomiting.  Noted Leukocytosis at the time. Hx of cannaboid hyperemesis.  Pt discharged with Olanzapine in the hospital which is helping.  Dicyclomine is helping with IBS and food.  She is planning on going to [place] and filled out the paperwork.  Will work with [facility] for eating disorder.   Relevant past medical, surgical, family and social history reviewed and updated as indicated. Interim medical history since our last visit reviewed. Allergies and medications reviewed and updated.  Review of Systems  Per HPI unless specifically indicated above     Objective:    BP 130/82   Pulse (!) 108   Temp 98.6 F (37 C) (Oral)   Resp 16   Ht '5\' 4"'$  (1.626 m)   Wt 98 lb 1.6 oz (44.5 kg)   LMP 02/19/2021 (Approximate)   SpO2 99%   BMI 16.84 kg/m   Wt Readings from Last 3 Encounters:  03/15/21 98 lb 1.6 oz (44.5 kg)  03/08/21 92 lb (41.7 kg)  03/04/21 100 lb (45.4 kg)    Physical Exam Constitutional:      General: She is not in acute distress.    Appearance: Normal appearance. She is well-developed.  HENT:     Head: Normocephalic and atraumatic.  Eyes:     General: Lids are normal. No scleral icterus.       Right eye: No discharge.        Left eye: No discharge.     Conjunctiva/sclera: Conjunctivae normal.  Neck:     Vascular: No carotid bruit or JVD.  Cardiovascular:     Rate and Rhythm: Normal rate and regular rhythm.     Heart sounds: Normal heart sounds.  Pulmonary:     Effort: Pulmonary effort is normal. No  respiratory distress.     Breath sounds: Normal breath sounds.  Abdominal:     Palpations: There is no hepatomegaly or splenomegaly.  Musculoskeletal:        General: Normal range of motion.     Cervical back: Normal range of motion and neck supple.  Skin:    General: Skin is warm and dry.     Coloration: Skin is not pale.     Findings: No rash.  Neurological:     Mental Status: She is alert and oriented to person, place, and time.  Psychiatric:        Behavior: Behavior normal.        Thought Content: Thought content normal.        Judgment: Judgment normal.    Results for orders placed or performed during the hospital encounter of 03/04/21  Resp Panel by RT-PCR (Flu A&B, Covid) Nasopharyngeal Swab   Specimen: Nasopharyngeal Swab; Nasopharyngeal(NP) swabs in vial transport medium  Result Value Ref Range   SARS Coronavirus 2 by RT PCR NEGATIVE NEGATIVE   Influenza A by PCR NEGATIVE NEGATIVE   Influenza  B by PCR NEGATIVE NEGATIVE  Lipase, blood  Result Value Ref Range   Lipase 35 11 - 51 U/L  Comprehensive metabolic panel  Result Value Ref Range   Sodium 138 135 - 145 mmol/L   Potassium 3.6 3.5 - 5.1 mmol/L   Chloride 101 98 - 111 mmol/L   CO2 24 22 - 32 mmol/L   Glucose, Bld 99 70 - 99 mg/dL   BUN 16 6 - 20 mg/dL   Creatinine, Ser 0.79 0.44 - 1.00 mg/dL   Calcium 9.6 8.9 - 10.3 mg/dL   Total Protein 8.0 6.5 - 8.1 g/dL   Albumin 4.9 3.5 - 5.0 g/dL   AST 20 15 - 41 U/L   ALT 25 0 - 44 U/L   Alkaline Phosphatase 50 38 - 126 U/L   Total Bilirubin 1.3 (H) 0.3 - 1.2 mg/dL   GFR, Estimated >60 >60 mL/min   Anion gap 13 5 - 15  CBC  Result Value Ref Range   WBC 13.2 (H) 4.0 - 10.5 K/uL   RBC 4.92 3.87 - 5.11 MIL/uL   Hemoglobin 13.9 12.0 - 15.0 g/dL   HCT 41.4 36.0 - 46.0 %   MCV 84.1 80.0 - 100.0 fL   MCH 28.3 26.0 - 34.0 pg   MCHC 33.6 30.0 - 36.0 g/dL   RDW 13.6 11.5 - 15.5 %   Platelets 348 150 - 400 K/uL   nRBC 0.0 0.0 - 0.2 %  Urinalysis, Complete w  Microscopic  Result Value Ref Range   Color, Urine YELLOW (A) YELLOW   APPearance CLOUDY (A) CLEAR   Specific Gravity, Urine 1.020 1.005 - 1.030   pH 6.0 5.0 - 8.0   Glucose, UA NEGATIVE NEGATIVE mg/dL   Hgb urine dipstick SMALL (A) NEGATIVE   Bilirubin Urine NEGATIVE NEGATIVE   Ketones, ur 80 (A) NEGATIVE mg/dL   Protein, ur NEGATIVE NEGATIVE mg/dL   Nitrite NEGATIVE NEGATIVE   Leukocytes,Ua LARGE (A) NEGATIVE   RBC / HPF 6-10 0 - 5 RBC/hpf   WBC, UA 21-50 0 - 5 WBC/hpf   Bacteria, UA FEW (A) NONE SEEN   Squamous Epithelial / LPF 21-50 0 - 5   Mucus PRESENT   hCG, quantitative, pregnancy  Result Value Ref Range   hCG, Beta Chain, Quant, S <1 <5 mIU/mL  Basic metabolic panel  Result Value Ref Range   Sodium 134 (L) 135 - 145 mmol/L   Potassium 3.8 3.5 - 5.1 mmol/L   Chloride 101 98 - 111 mmol/L   CO2 23 22 - 32 mmol/L   Glucose, Bld 70 70 - 99 mg/dL   BUN 12 6 - 20 mg/dL   Creatinine, Ser 0.60 0.44 - 1.00 mg/dL   Calcium 8.7 (L) 8.9 - 10.3 mg/dL   GFR, Estimated >60 >60 mL/min   Anion gap 10 5 - 15  CBC  Result Value Ref Range   WBC 11.1 (H) 4.0 - 10.5 K/uL   RBC 3.85 (L) 3.87 - 5.11 MIL/uL   Hemoglobin 11.3 (L) 12.0 - 15.0 g/dL   HCT 33.0 (L) 36.0 - 46.0 %   MCV 85.7 80.0 - 100.0 fL   MCH 29.4 26.0 - 34.0 pg   MCHC 34.2 30.0 - 36.0 g/dL   RDW 13.2 11.5 - 15.5 %   Platelets 289 150 - 400 K/uL   nRBC 0.0 0.0 - 0.2 %  HIV Antibody (routine testing w rflx)  Result Value Ref Range   HIV Screen 4th Generation  wRfx Non Reactive Non Reactive  Glucose, capillary  Result Value Ref Range   Glucose-Capillary 75 70 - 99 mg/dL   Comment 1 Notify RN   CBC with Differential/Platelet  Result Value Ref Range   WBC 9.8 4.0 - 10.5 K/uL   RBC 4.23 3.87 - 5.11 MIL/uL   Hemoglobin 12.1 12.0 - 15.0 g/dL   HCT 35.9 (L) 36.0 - 46.0 %   MCV 84.9 80.0 - 100.0 fL   MCH 28.6 26.0 - 34.0 pg   MCHC 33.7 30.0 - 36.0 g/dL   RDW 12.9 11.5 - 15.5 %   Platelets 333 150 - 400 K/uL   nRBC  0.0 0.0 - 0.2 %   Neutrophils Relative % 71 %   Neutro Abs 7.0 1.7 - 7.7 K/uL   Lymphocytes Relative 19 %   Lymphs Abs 1.8 0.7 - 4.0 K/uL   Monocytes Relative 8 %   Monocytes Absolute 0.8 0.1 - 1.0 K/uL   Eosinophils Relative 1 %   Eosinophils Absolute 0.1 0.0 - 0.5 K/uL   Basophils Relative 1 %   Basophils Absolute 0.1 0.0 - 0.1 K/uL   Immature Granulocytes 0 %   Abs Immature Granulocytes 0.03 0.00 - 0.07 K/uL  Basic metabolic panel  Result Value Ref Range   Sodium 133 (L) 135 - 145 mmol/L   Potassium 4.3 3.5 - 5.1 mmol/L   Chloride 100 98 - 111 mmol/L   CO2 22 22 - 32 mmol/L   Glucose, Bld 69 (L) 70 - 99 mg/dL   BUN 8 6 - 20 mg/dL   Creatinine, Ser 0.73 0.44 - 1.00 mg/dL   Calcium 9.2 8.9 - 10.3 mg/dL   GFR, Estimated >60 >60 mL/min   Anion gap 11 5 - 15  Magnesium  Result Value Ref Range   Magnesium 2.1 1.7 - 2.4 mg/dL      Assessment & Plan:   Problem List Items Addressed This Visit       Unprioritized   Anxiety    Doing well with Olanaepine 2.5 mg. Refill today but will ask [facility] to take over      Atypical eating disorder    Appt set up with [facility]. Feels she is eating more      Bradycardia    Seeing cardiology due to variable pulse.  High today but low in hospital      IBS (irritable bowel syndrome)    Doing well with Bentyl      Nausea and vomiting in adult    Resolved.  Pt aware that marijuana use has caused problems.         Follow up plan: No follow-ups on file.

## 2021-03-20 ENCOUNTER — Telehealth: Payer: Self-pay

## 2021-03-20 ENCOUNTER — Other Ambulatory Visit: Payer: Self-pay

## 2021-03-20 NOTE — Telephone Encounter (Signed)
Copied from [place]. Topic: General - Inquiry >> Mar 20, 2021  1:38 PM Greggory Keen D wrote: Reason for CRM: t called  Saying that the pharmacy told her that insurance is requiring a PA on her anxiety medication and that she for got to mention that she needs a refill  on the Bentyl for her stomach.  [facility]

## 2021-03-21 ENCOUNTER — Encounter: Payer: Self-pay | Admitting: Cardiology

## 2021-03-21 ENCOUNTER — Other Ambulatory Visit: Payer: Self-pay

## 2021-03-21 ENCOUNTER — Ambulatory Visit (INDEPENDENT_AMBULATORY_CARE_PROVIDER_SITE_OTHER): Payer: Medicaid Other | Admitting: Cardiology

## 2021-03-21 ENCOUNTER — Ambulatory Visit (INDEPENDENT_AMBULATORY_CARE_PROVIDER_SITE_OTHER): Payer: Medicaid Other

## 2021-03-21 VITALS — BP 120/74 | HR 85 | Ht 64.0 in | Wt 100.0 lb

## 2021-03-21 DIAGNOSIS — R001 Bradycardia, unspecified: Secondary | ICD-10-CM | POA: Diagnosis not present

## 2021-03-21 DIAGNOSIS — F509 Eating disorder, unspecified: Secondary | ICD-10-CM | POA: Diagnosis not present

## 2021-03-21 NOTE — Telephone Encounter (Signed)
Patient called in to inform Kathrine Haddock that she is waiting on a call back to know whether the prior auth was approved or not and what her next step will be. Please call patient with an update at Ph# [phone]

## 2021-03-21 NOTE — Patient Instructions (Signed)
Medication Instructions:  - Your physician recommends that you continue on your current medications as directed. Please refer to the Current Medication list given to you today.  *If you need a refill on your cardiac medications before your next appointment, please call your pharmacy*   Lab Work: - none ordered  If you have labs (blood work) drawn today and your tests are completely normal, you will receive your results only by: [facility] (if you have MyChart) OR A paper copy in the mail If you have any lab test that is abnormal or we need to change your treatment, we will call you to review the results.   Testing/Procedures: 1) 3 day Heart Monitor (to be placed in office today)  - Your physician has recommended that you wear a Zio (heart) monitor x 3 days.   This monitor is a medical device that records the heart's electrical activity. Doctors most often use these monitors to diagnose arrhythmias. Arrhythmias are problems with the speed or rhythm of the heartbeat. The monitor is a small device applied to your chest. You can wear one while you do your normal daily activities. While wearing this monitor if you have any symptoms to push the button and record what you felt. Once you have worn this monitor for the period of time provider prescribed (Usually 14 days), you will return the monitor device in the postage paid box. Once it is returned they will download the data collected and provide [place] with a report which the provider will then review and we will call you with those results. Important tips:  Avoid showering during the first 24 hours of wearing the monitor. Avoid excessive sweating to help maximize wear time. Do not submerge the device, no hot tubs, and no swimming pools. Keep any lotions or oils away from the patch. After 24 hours you may shower with the patch on. Take brief showers with your back facing the shower head.  Do not remove patch once it has been placed because  that will interrupt data and decrease adhesive wear time. Push the button when you have any symptoms and write down what you were feeling. Once you have completed wearing your monitor, remove and place into box which has postage paid and place in your outgoing mailbox.  If for some reason you have misplaced your box then call our office and we can provide another box and/or mail it off for you.      Follow-Up: At [facility], you and your health needs are our priority.  As part of our continuing mission to provide you with exceptional heart care, we have created designated Provider Care Teams.  These Care Teams include your primary Cardiologist (physician) and Advanced Practice Providers (APPs -  Physician Assistants and Nurse Practitioners) who all work together to provide you with the care you need, when you need it.  We recommend signing up for the patient portal called "MyChart".  Sign up information is provided on this After Visit Summary.  MyChart is used to connect with patients for Virtual Visits (Telemedicine).  Patients are able to view lab/test results, encounter notes, upcoming appointments, etc.  Non-urgent messages can be sent to your provider as well.   To learn more about what you can do with MyChart, go to [email or web address].    Your next appointment:   As needed pending the results of your heart monitor   The format for your next appointment:   In Person  Provider:   Lysbeth Galas  Quentin Ore, MD   Other Instructions N/a

## 2021-03-21 NOTE — Telephone Encounter (Signed)
PA approved 03/20/2021

## 2021-03-21 NOTE — Progress Notes (Signed)
Electrophysiology Office Note:    Date:  03/21/2021   ID:  Lodie, Erby 08-31-1995, MRN [id number]  PCP:  [place] Cardiologist:  None  [facility] Electrophysiologist:  None   Referring MD: Short*   Chief Complaint: Bradycardia  History of Present Illness:    Tiffany Guzman is a 25 y.o. female who presents for an evaluation of bradycardia at the request of Kathrine Haddock, NP. Their medical history includes migraine, depression, ADHD, cannabinoid hyperemesis, eating disorder.  She has had difficulty in the past with chronic frequent marijuana use and resultant hyperemesis.  She is also had trouble eating disorders.  She tells me that she is now eating better and putting on weight and is stop marijuana use altogether.  She thinks things have been better regulated since making these changes.  Past Medical History:  Diagnosis Date   Acne    Anxiety    Attention deficit hyperactivity disorder (ADHD)    Chronic upper back pain    lower and upper back pain   Depression    Dyspnea    GERD (gastroesophageal reflux disease)    Gestational hypertension 11/14/2017   Migraine with aura    since elementary school   Postoperative nausea and vomiting 02/27/2021    Past Surgical History:  Procedure Laterality Date   CESAREAN SECTION N/A 11/14/2017   Procedure: CESAREAN SECTION;  Surgeon: Will Bonnet, MD;  Location: [facility] ORS;  Service: Obstetrics;  Laterality: N/A;   ESOPHAGOGASTRODUODENOSCOPY Left 02/17/2019   Procedure: ESOPHAGOGASTRODUODENOSCOPY (EGD);  Surgeon: Virgel Manifold, MD;  Location: [facility] ENDOSCOPY;  Service: Endoscopy;  Laterality: Left;   INSERTION OF NON VAGINAL CONTRACEPTIVE DEVICE     LAPAROSCOPIC OVARIAN CYSTECTOMY Right 02/23/2019   Procedure: LAPAROSCOPIC OVARIAN CYSTECTOMY;  Surgeon: Homero Fellers, MD;  Location: [facility] ORS;  Service: Gynecology;  Laterality: Right;    LAPAROSCOPY N/A 02/25/2021   Procedure: LAPAROSCOPY DIAGNOSTIC;  Surgeon: Will Bonnet, MD;  Location: [facility] ORS;  Service: Gynecology;  Laterality: N/A;   OOPHORECTOMY Left 10/03/2017   Procedure: OOPHORECTOMY;  Surgeon: Homero Fellers, MD;  Location: [facility] ORS;  Service: Gynecology;  Laterality: Left;   OVARIAN CYST REMOVAL Left 10/03/2017   Procedure: OVARIAN CYSTECTOMY;  Surgeon: Homero Fellers, MD;  Location: [facility] ORS;  Service: Gynecology;  Laterality: Left;   OVARIAN CYST REMOVAL Right 02/25/2021   Procedure: OVARIAN CYSTECTOMY;  Surgeon: Will Bonnet, MD;  Location: [facility] ORS;  Service: Gynecology;  Laterality: Right;    Current Medications: Current Meds  Medication Sig   acetaminophen (TYLENOL) 500 MG tablet Take 2 tablets (1,000 mg total) by mouth every 6 (six) hours as needed for mild pain.   dicyclomine (BENTYL) 20 MG tablet Take 1 tablet (20 mg total) by mouth 4 (four) times daily -  before meals and at bedtime.   ibuprofen (ADVIL) 400 MG tablet Take 1.5 tablets (600 mg total) by mouth every 8 (eight) hours as needed for headache or mild pain.   Multiple Vitamins-Minerals (WOMENS MULTIVITAMIN) TABS Take 1 tablet by mouth daily.   OLANZapine (ZYPREXA) 2.5 MG tablet Take 1 tablet (2.5 mg total) by mouth at bedtime.     Allergies:   Buspar [buspirone]   Social History   Socioeconomic History   Marital status: Single    Spouse name: Not on file   Number of children: Not on file   Years of education: Not on file   Highest education level:  Not on file  Occupational History   Not on file  Tobacco Use   Smoking status: Never   Smokeless tobacco: Never  Vaping Use   Vaping Use: Every day   Start date: 09/20/2019   Substances: Nicotine, Flavoring  Substance and Sexual Activity   Alcohol use: No    Alcohol/week: 0.0 standard drinks   Drug use: Yes    Frequency: 7.0 times per week    Types: Marijuana    Comment: has stopped for 10 days   Sexual  activity: Yes    Partners: Male    Birth control/protection: None  Other Topics Concern   Not on file  Social History Narrative   Not on file   Social Determinants of Health   Financial Resource Strain: Not on file  Food Insecurity: Not on file  Transportation Needs: Not on file  Physical Activity: Not on file  Stress: Not on file  Social Connections: Not on file     Family History: The patient's family history includes Depression in her mother; Diabetes in her maternal grandmother; Hypertension in her mother; Hypothyroidism in her mother.  ROS:   Please see the history of present illness.    All other systems reviewed and are negative.  EKGs/Labs/Other Studies Reviewed:    The following studies were reviewed today:  February 26, 2021 EKG Sinus bradycardia with a ventricular rate of 47   August 01, 2019 EKG Sinus tachycardia with ventricular to 110 bpm   EKG:  The ekg ordered today demonstrates sinus rhythm.  Ventricular rate of 85 bpm.  No preexcitation.  Normal intervals.  Recent Labs: 02/26/2021: TSH 0.476 02/27/2021: B Natriuretic Peptide 105.3 03/04/2021: ALT 25 03/07/2021: BUN 8; Creatinine, Ser 0.73; Hemoglobin 12.1; Magnesium 2.1; Platelets 333; Potassium 4.3; Sodium 133  Recent Lipid Panel No results found for: CHOL, TRIG, HDL, CHOLHDL, VLDL, LDLCALC, LDLDIRECT  Physical Exam:    VS:  BP 120/74 (BP Location: Left Arm, Patient Position: Sitting, Cuff Size: Normal)   Pulse 85   Ht '5\' 4"'$  (1.626 m)   Wt 100 lb (45.4 kg)   LMP 02/19/2021 (Approximate)   SpO2 98%   BMI 17.16 kg/m     Wt Readings from Last 3 Encounters:  03/21/21 100 lb (45.4 kg)  03/15/21 98 lb 1.6 oz (44.5 kg)  03/08/21 92 lb (41.7 kg)     GEN:  Well nourished, well developed in no acute distress HEENT: Normal NECK: No JVD; No carotid bruits LYMPHATICS: No lymphadenopathy CARDIAC: RRR, no murmurs, rubs, gallops RESPIRATORY:  Clear to auscultation without rales, wheezing or rhonchi   ABDOMEN: Soft, non-tender, non-distended MUSCULOSKELETAL:  No edema; No deformity  SKIN: Warm and dry NEUROLOGIC:  Alert and oriented x 3 PSYCHIATRIC:  Normal affect   ASSESSMENT:    1. Bradycardia   2. Atypical eating disorder    PLAN:    In order of problems listed above:   #Bradycardia Heart rate normal today.  I suspect there is an element of dysautonomia contributing to her fluctuations in heart rates.  I think her chronic severe marijuana use and eating disorders were also contributing.  Things have improved since stopping marijuana and improving her diet.  I will plan to check a 3-day ZIO monitor to assess her heart rate variability and for any arrhythmias.  Follow-up based on results.     Medication Adjustments/Labs and Tests Ordered: Current medicines are reviewed at length with the patient today.  Concerns regarding medicines are outlined above.  No  orders of the defined types were placed in this encounter.  No orders of the defined types were placed in this encounter.    Signed, Hilton Cork. Quentin Ore, MD, [facility], [facility] 03/21/2021 2:19 PM    Electrophysiology [facility]

## 2021-03-22 MED ORDER — DICYCLOMINE HCL 20 MG PO TABS: 20.0000 mg | ORAL_TABLET | Freq: Three times a day (TID) | ORAL | 0 refills | Status: DC

## 2021-05-30 ENCOUNTER — Other Ambulatory Visit: Payer: Self-pay

## 2021-05-30 ENCOUNTER — Ambulatory Visit (INDEPENDENT_AMBULATORY_CARE_PROVIDER_SITE_OTHER): Payer: Medicaid Other | Admitting: Family Medicine

## 2021-05-30 ENCOUNTER — Encounter: Payer: Self-pay | Admitting: Family Medicine

## 2021-05-30 VITALS — BP 110/72 | HR 96 | Temp 97.5°F | Resp 16 | Ht 64.0 in | Wt 113.2 lb

## 2021-05-30 DIAGNOSIS — D271 Benign neoplasm of left ovary: Secondary | ICD-10-CM | POA: Diagnosis not present

## 2021-05-30 DIAGNOSIS — K589 Irritable bowel syndrome without diarrhea: Secondary | ICD-10-CM | POA: Diagnosis not present

## 2021-05-30 DIAGNOSIS — F509 Eating disorder, unspecified: Secondary | ICD-10-CM

## 2021-05-30 DIAGNOSIS — F419 Anxiety disorder, unspecified: Secondary | ICD-10-CM | POA: Diagnosis not present

## 2021-05-30 DIAGNOSIS — R112 Nausea with vomiting, unspecified: Secondary | ICD-10-CM | POA: Diagnosis not present

## 2021-05-30 MED ORDER — DICYCLOMINE HCL 10 MG PO CAPS: 20.0000 mg | ORAL_CAPSULE | Freq: Three times a day (TID) | ORAL | 2 refills | Status: DC

## 2021-05-30 MED ORDER — ONDANSETRON 4 MG PO TBDP: 4.0000 mg | ORAL_TABLET | Freq: Three times a day (TID) | ORAL | 0 refills | Status: DC | PRN

## 2021-05-30 MED ORDER — OLANZAPINE 2.5 MG PO TABS: 2.5000 mg | ORAL_TABLET | Freq: Every day | ORAL | 1 refills | Status: DC

## 2021-05-30 NOTE — Patient Instructions (Signed)
It was great to see you!  Our plans for today:  - Continue your zyprexa and bentyl. - Let us know if you don't hear about an appointment with the psychiatrist. - Come back in 2 weeks.   Take care and seek immediate care sooner if you develop any concerns.   Dr. Ky Barban

## 2021-05-30 NOTE — Progress Notes (Signed)
    SUBJECTIVE:   CHIEF COMPLAINT / HPI:   ABDOMINAL ISSUES - h/o IBS, anxiety, eating d/o, GERD. On bentyl. - prior ovarian cystectomy 02/2021 for dermoid cyst, C section, cholecystectomy 12/2020 - prior EGD 2020, normal appearing esophagus, stomach, duodenal bulb. Negative biopsies.   - has started back smoking MJ - LMP few weeks ago. Not on contraception.  - no dysuria, abnl vaginal discharge  Duration:  4-5 days - eating habits changed a few weeks ago after URI.  - able to stay hydrated, avoiding soda. Nature: dull, discomforting Location: diffuse Radiation: no Frequency: intermittent Alleviating factors: none Aggravating factors: none Treatments attempted: bentyl, needs refill Constipation: no Diarrhea: yes Episodes of diarrhea/day: 2 Mucous in the stool: yes Heartburn: no Bloating:no Flatulence: no Nausea: yes Vomiting: no Episodes of vomit/day:n/a Melena or hematochezia: no Rash: no Jaundice: no Fever: no Weight loss: yes. Was 125lb about a month ago, now 113lb. Has loss of appetite. Typically will have abdominal discomfort when anxiety not well controlled.  Anxiety, H/o eating d/o - Medications: zyprexa 2.5 daily - Taking: has been out for a few weeks with worsening symptoms. - Counseling: not currently  - Previous hospitalizations: yes - Symptoms: excessive worrying, clammy hands, nervousness - Current stressors: belly pain, work - Coping Mechanisms: lavendar bath  GAD 7 : Generalized Anxiety Score 05/30/2021 03/15/2021 10/06/2019 08/11/2019  Nervous, Anxious, on Edge 1 0 2 3  Control/stop worrying 0 0 3 3  Worry too much - different things 0 0 3 3  Trouble relaxing 1 0 2 3  Restless 0 0 1 3  Easily annoyed or irritable 1 0 2 3  Afraid - awful might happen 0 0 2 3  Total GAD 7 Score 3 0 15 21  Anxiety Difficulty Not difficult at all Not difficult at all Not difficult at all -    OBJECTIVE:   BP 110/72   Pulse 96   Temp (!) 97.5 F (36.4 C)  (Oral)   Resp 16   Ht 5\' 4"  (1.626 m)   Wt 113 lb 3.2 oz (51.3 kg)   SpO2 99%   BMI 19.43 kg/m   Gen: well appearing, in NAD Card: RRR Lungs: CTAB Abd: soft, TTP diffusely. No organomegaly. No rebound tenderness or guarding. +BS. No rashes or jaundice. Ext: WWP, no edema Psych: pleasant, appropriate mood and affect  ASSESSMENT/PLAN:   Anxiety Chronic. Exacerbated since being out of meds. Refill provided. Referred to psychiatry given prior history.   Abdominal pain Longstanding history of diffuse abdominal pain with multiple ED visits and extensive w/u in the past. Likely 2/2 IBS with recent exacerbated anxiety, also with cannabis use, restarted recently as well. Per chart review, with h/o atypical eating d/o, may also be contributing. Recommend cannabis cessation. Will restart zyprexa, bentyl and follow up in 2 weeks. If no improvement in symptoms, consider labs and stool studies at that time.   Myles Gip, DO

## 2021-05-30 NOTE — Assessment & Plan Note (Signed)
Chronic. Exacerbated since being out of meds. Refill provided. Referred to psychiatry given prior history.

## 2021-06-04 ENCOUNTER — Ambulatory Visit: Payer: Self-pay

## 2021-06-04 ENCOUNTER — Telehealth: Payer: Self-pay | Admitting: *Deleted

## 2021-06-04 NOTE — Patient Instructions (Signed)
Tiffany Guzman ,   The [facility] Managed Care Team is available to provide assistance to you with your healthcare needs at no cost and as a benefit of your [facility] plan. I'm sorry I was unable to reach you today for our scheduled appointment. Our care guide will call you to reschedule our telephone appointment. Please call me at the number below. I am available to be of assistance to you regarding your healthcare needs. .   Thank you,   Kelli Churn RN, CCM, [facility] Care Management Coordinator - Managed [place] High Risk [phone]

## 2021-06-04 NOTE — Patient Outreach (Signed)
Care Coordination  06/04/2021  [name] Jun 15, 1996 [id number]  06/04/2021 Name: Tiffany Guzman MRN: [id number] DOB: Feb 29, 1996  Referred by: [place] Reason for referral : High Risk Managed Medicaid (Unsuccessful initial RNCM telephone outreach)   An unsuccessful telephone outreach was attempted today. The patient was referred to the case management team for assistance with care management and care coordination.   Unable to leave message on mobile number as recording states voice mail not set up. Home number rang then high pitched sound heard with no option to leave message.   Follow Up Plan: The Managed Medicaid care management team will reach out to the patient again over the next 7 days.    Kelli Churn RN, CCM, [facility] Care Management Coordinator - Managed [place] High Risk [phone]

## 2021-06-12 ENCOUNTER — Ambulatory Visit: Payer: Medicaid Other | Admitting: Family Medicine

## 2021-07-09 ENCOUNTER — Other Ambulatory Visit: Payer: Self-pay | Admitting: Family Medicine

## 2021-07-13 ENCOUNTER — Other Ambulatory Visit: Payer: Self-pay

## 2021-07-13 ENCOUNTER — Emergency Department
Admission: EM | Admit: 2021-07-13 | Discharge: 2021-07-13 | Disposition: A | Discharge status: Home / Self Care | Payer: Medicaid Other | Attending: Emergency Medicine | Admitting: Emergency Medicine

## 2021-07-13 ENCOUNTER — Encounter: Payer: Self-pay | Admitting: Emergency Medicine

## 2021-07-13 DIAGNOSIS — R197 Diarrhea, unspecified: Secondary | ICD-10-CM | POA: Diagnosis not present

## 2021-07-13 DIAGNOSIS — Z9049 Acquired absence of other specified parts of digestive tract: Secondary | ICD-10-CM | POA: Insufficient documentation

## 2021-07-13 DIAGNOSIS — K219 Gastro-esophageal reflux disease without esophagitis: Secondary | ICD-10-CM | POA: Diagnosis not present

## 2021-07-13 DIAGNOSIS — R109 Unspecified abdominal pain: Secondary | ICD-10-CM | POA: Diagnosis present

## 2021-07-13 DIAGNOSIS — B349 Viral infection, unspecified: Secondary | ICD-10-CM | POA: Diagnosis not present

## 2021-07-13 LAB — URINALYSIS, ROUTINE W REFLEX MICROSCOPIC
Bacteria, UA: NONE SEEN
Bilirubin Urine: NEGATIVE
Glucose, UA: NEGATIVE mg/dL
Ketones, ur: NEGATIVE mg/dL
Leukocytes,Ua: NEGATIVE
Nitrite: NEGATIVE
Protein, ur: NEGATIVE mg/dL
Specific Gravity, Urine: 1.025 (ref 1.005–1.030)
pH: 5.5 (ref 5.0–8.0)

## 2021-07-13 LAB — COMPREHENSIVE METABOLIC PANEL
ALT: 13 U/L (ref 0–44)
AST: 18 U/L (ref 15–41)
Albumin: 4.2 g/dL (ref 3.5–5.0)
Alkaline Phosphatase: 72 U/L (ref 38–126)
Anion gap: 4 — ABNORMAL LOW (ref 5–15)
BUN: 10 mg/dL (ref 6–20)
CO2: 26 mmol/L (ref 22–32)
Calcium: 8.7 mg/dL — ABNORMAL LOW (ref 8.9–10.3)
Chloride: 109 mmol/L (ref 98–111)
Creatinine, Ser: 0.75 mg/dL (ref 0.44–1.00)
GFR, Estimated: >60 mL/min (ref >60)
Glucose, Bld: 99 mg/dL (ref 70–99)
Potassium: 3.5 mmol/L (ref 3.5–5.1)
Sodium: 139 mmol/L (ref 135–145)
Total Bilirubin: 0.4 mg/dL (ref 0.3–1.2)
Total Protein: 7.6 g/dL (ref 6.5–8.1)

## 2021-07-13 LAB — CBC
HCT: 37.2 % (ref 36.0–46.0)
Hemoglobin: 12.2 g/dL (ref 12.0–15.0)
MCH: 28.2 pg (ref 26.0–34.0)
MCHC: 32.8 g/dL (ref 30.0–36.0)
MCV: 85.9 fL (ref 80.0–100.0)
Platelets: 266 10*3/uL (ref 150–400)
RBC: 4.33 MIL/uL (ref 3.87–5.11)
RDW: 12.5 % (ref 11.5–15.5)
WBC: 5.4 10*3/uL (ref 4.0–10.5)
nRBC: 0 % (ref 0.0–0.2)

## 2021-07-13 LAB — LIPASE, BLOOD: Lipase: 32 U/L (ref 11–51)

## 2021-07-13 LAB — POC URINE PREG, ED: Preg Test, Ur: NEGATIVE

## 2021-07-13 MED ORDER — ONDANSETRON 4 MG PO TBDP: 4.0000 mg | ORAL_TABLET | Freq: Three times a day (TID) | ORAL | 0 refills | Status: DC | PRN

## 2021-07-13 MED ORDER — SODIUM CHLORIDE 0.9 % IV BOLUS
1000.0000 mL | Freq: Once | INTRAVENOUS | Status: AC
  Administered 2021-07-13: 10:00:00 1000 mL via INTRAVENOUS

## 2021-07-13 NOTE — ED Provider Notes (Signed)
[facility] Emergency Department Provider Note  ____________________________________________   Event Date/Time   First MD Initiated Contact with Patient 07/13/21 0845     (approximate)  I have reviewed the triage vital signs and the nursing notes.   HISTORY  Chief Complaint Abdominal Pain, Nausea, and Diarrhea   HPI Tiffany Guzman is a 25 y.o. female presents to the ED with complaint of abdominal pain that started 2 days ago.  Patient states that she has had some episodes of emesis which has resolved and she also has had diarrhea approximately 2 times a day in the last 24 hours.  Patient states she "feels drained".  She denies any upper respiratory symptoms.  Currently she rates her pain as 0/10.        Past Medical History:  Diagnosis Date   Acne    Anxiety    Attention deficit hyperactivity disorder (ADHD)    Chronic upper back pain    lower and upper back pain   Depression    Dyspnea    GERD (gastroesophageal reflux disease)    Gestational hypertension 11/14/2017   Migraine with aura    since elementary school   Postoperative nausea and vomiting 02/27/2021    Patient Active Problem List   Diagnosis Date Noted   IBS (irritable bowel syndrome) 03/15/2021   Nausea and vomiting in adult 02/28/2021   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   Bradycardia 02/26/2021   Right ovarian cyst 02/25/2021   Leukocytosis 02/25/2021   Status post ovarian cystectomy 02/25/2021   S/P cholecystectomy    Status post cesarean delivery 11/14/2017   Dermoid cyst 10/03/2017   Dermoid cyst of left ovary 10/01/2017   Anxiety 06/24/2016    Past Surgical History:  Procedure Laterality Date   CESAREAN SECTION N/A 11/14/2017   Procedure: CESAREAN SECTION;  Surgeon: Will Bonnet, MD;  Location: [facility] ORS;  Service: Obstetrics;  Laterality: N/A;   ESOPHAGOGASTRODUODENOSCOPY Left 02/17/2019   Procedure: ESOPHAGOGASTRODUODENOSCOPY (EGD);  Surgeon: Virgel Manifold, MD;  Location: [facility] ENDOSCOPY;  Service: Endoscopy;  Laterality: Left;   INSERTION OF NON VAGINAL CONTRACEPTIVE DEVICE     LAPAROSCOPIC OVARIAN CYSTECTOMY Right 02/23/2019   Procedure: LAPAROSCOPIC OVARIAN CYSTECTOMY;  Surgeon: Homero Fellers, MD;  Location: [facility] ORS;  Service: Gynecology;  Laterality: Right;   LAPAROSCOPY N/A 02/25/2021   Procedure: LAPAROSCOPY DIAGNOSTIC;  Surgeon: Will Bonnet, MD;  Location: [facility] ORS;  Service: Gynecology;  Laterality: N/A;   OOPHORECTOMY Left 10/03/2017   Procedure: OOPHORECTOMY;  Surgeon: Homero Fellers, MD;  Location: [facility] ORS;  Service: Gynecology;  Laterality: Left;   OVARIAN CYST REMOVAL Left 10/03/2017   Procedure: OVARIAN CYSTECTOMY;  Surgeon: Homero Fellers, MD;  Location: [facility] ORS;  Service: Gynecology;  Laterality: Left;   OVARIAN CYST REMOVAL Right 02/25/2021   Procedure: OVARIAN CYSTECTOMY;  Surgeon: Will Bonnet, MD;  Location: [facility] ORS;  Service: Gynecology;  Laterality: Right;    Prior to Admission medications   Medication Sig Start Date End Date Taking? Authorizing Provider  ondansetron (ZOFRAN-ODT) 4 MG disintegrating tablet Take 1 tablet (4 mg total) by mouth every 8 (eight) hours as needed for nausea or vomiting. 07/13/21  Yes Johnn Hai, PA-C  acetaminophen (TYLENOL) 500 MG tablet Take 2 tablets (1,000 mg total) by mouth every 6 (six) hours as needed for mild pain. 01/16/21   Olean Ree, MD  dicyclomine (BENTYL) 10 MG capsule Take 2 capsules (20 mg total) by mouth 4 (four) times daily -  before meals and at bedtime. 05/30/21 08/28/21  Myles Gip, DO  ibuprofen (ADVIL) 400 MG tablet Take 1.5 tablets (600 mg total) by mouth every 8 (eight) hours as needed for headache or mild pain. 03/07/21   Fritzi Mandes, MD  Multiple Vitamins-Minerals (WOMENS MULTIVITAMIN) TABS Take 1 tablet by mouth daily.    [provider]  OLANZapine (ZYPREXA) 2.5 MG tablet Take 1 tablet (2.5 mg total) by  mouth at bedtime. 05/30/21   Myles Gip, DO  pantoprazole (PROTONIX) 40 MG tablet Take 1 tablet (40 mg total) by mouth daily. 03/01/21 03/01/22  Will Bonnet, MD    Allergies Buspar [buspirone]  Family History  Problem Relation Age of Onset   Hypertension Mother    Hypothyroidism Mother    Depression Mother    Diabetes Maternal Grandmother     Social History Social History   Tobacco Use   Smoking status: Never   Smokeless tobacco: Never  Vaping Use   Vaping Use: Every day   Start date: 09/20/2019   Substances: Nicotine, Flavoring  Substance Use Topics   Alcohol use: No    Alcohol/week: 0.0 standard drinks   Drug use: Yes    Frequency: 7.0 times per week    Types: Marijuana    Comment: has stopped for 10 days    Review of Systems Constitutional: No fever/chills Eyes: No visual changes. ENT: No sore throat. Cardiovascular: Denies chest pain. Respiratory: Denies shortness of breath. Gastrointestinal: No abdominal pain.  Positive nausea, negative vomiting.  Positive diarrhea.  No constipation. Genitourinary: Negative for dysuria. Musculoskeletal: Negative for back pain. Skin: Negative for rash. Neurological: Negative for headaches, focal weakness or numbness. ____________________________________________   PHYSICAL EXAM:  VITAL SIGNS: ED Triage Vitals  Enc Vitals Group     BP 07/13/21 0739 122/81     Pulse Rate 07/13/21 0739 70     Resp 07/13/21 0739 20     Temp 07/13/21 0739 98 F (36.7 C)     Temp Source 07/13/21 0739 Oral     SpO2 07/13/21 0739 100 %     Weight 07/13/21 0739 118 lb (53.5 kg)     Height 07/13/21 0739 5\' 4"  (1.626 m)     Head Circumference --      Peak Flow --      Pain Score 07/13/21 0741 0     Pain Loc --      Pain Edu? --      Excl. in [place]? --     Constitutional: Alert and oriented. Well appearing and in no acute distress.  Nontoxic in appearance. Eyes: Conjunctivae are normal.  Head: Atraumatic. Nose: No  congestion/rhinnorhea. Neck: No stridor.   Cardiovascular: Normal rate, regular rhythm. Grossly normal heart sounds.  Good peripheral circulation. Respiratory: Normal respiratory effort.  No retractions. Lungs CTAB. Gastrointestinal: Soft and nontender. No distention.  Bowel sounds normoactive x4 quadrants.. No CVA tenderness. Musculoskeletal: Moves upper and lower extremities without any difficulty.  Normal gait was noted. Neurologic:  Normal speech and language. No gross focal neurologic deficits are appreciated. No gait instability. Skin:  Skin is warm, dry and intact. No rash noted. Psychiatric: Mood and affect are normal. Speech and behavior are normal.  ____________________________________________   LABS (all labs ordered are listed, but only abnormal results are displayed)  Labs Reviewed  COMPREHENSIVE METABOLIC PANEL - Abnormal; Notable for the following components:      Result Value   Calcium 8.7 (*)    Anion gap 4 (*)  All other components within normal limits  URINALYSIS, ROUTINE W REFLEX MICROSCOPIC - Abnormal; Notable for the following components:   APPearance CLEAR (*)    Hgb urine dipstick LARGE (*)    All other components within normal limits  LIPASE, BLOOD  CBC  POC URINE PREG, ED    PROCEDURES  Procedure(s) performed (including Critical Care):  Procedures   ____________________________________________   INITIAL IMPRESSION / ASSESSMENT AND PLAN / ED COURSE  As part of my medical decision making, I reviewed the following data within the electronic MEDICAL RECORD NUMBER Notes from prior ED visits and  Controlled Substance Database  25 year old female presents to the ED with complaint of nausea and diarrhea for approximately 3 days.  Lab work was reassuring and patient stated that she felt "dehydrated".  She was given a liter of normal saline.  We discussed continued hydration at home and a prescription for Zofran was sent to her pharmacy to take as needed for  nausea.  She is to follow-up with her PCP if any continued problems or return to the emergency department over the holiday weekend if any worsening of her symptoms. ____________________________________________   FINAL CLINICAL IMPRESSION(S) / ED DIAGNOSES  Final diagnoses:  Viral illness     ED Discharge Orders          Ordered    ondansetron (ZOFRAN-ODT) 4 MG disintegrating tablet  Every 8 hours PRN        07/13/21 1106             Note:  This document was prepared using Dragon voice recognition software and may include unintentional dictation errors.    Johnn Hai, PA-C 07/13/21 1115    Blake Divine, MD 07/14/21 [phone]

## 2021-07-13 NOTE — ED Triage Notes (Signed)
Pt comes into the ED via POV c/o abd pain, N/D that started 3 days ago.  Pt denies any episodes of emesis, but is concerned she may be dehydrated due to lack of oral intake.  Pt states "I just feel drained".  Pt ambulatory to triage with even and unlabored respirations at this time.

## 2021-07-13 NOTE — Discharge Instructions (Signed)
Follow-up with your primary care provider if any continued problems.  Return to the emergency department over the weekend if any worsening of your symptoms.  A prescription for Zofran was sent to the pharmacy.  Clear liquids frequently to stay hydrated.  Tylenol if needed for fever or body aches.

## 2021-07-13 NOTE — ED Notes (Signed)
Pt to ED after having anxiety attack and hardly sleeping at all last night. Last big anxiety attack was 3 months ago and she takes daily med for this.   States that last night she felt "like an elephant was sitting on my chest" and then felt nauseous and had abdominal pain that felt "sore". States this morning she felt dizzy (but also had not slept much). Slept on and off for 2 hour periods last night.  At this time, states chest feels "a little heavy" perhaps from breathing hard last night during anxiety attack.   States has hardly been eating or drinking for 3 days and believes she is dehydrated.

## 2021-07-14 ENCOUNTER — Telehealth: Payer: Self-pay

## 2021-07-14 NOTE — Telephone Encounter (Signed)
Transition Care Management Unsuccessful Follow-up Telephone Call  Date of discharge and from where:  07/13/2021-[facility]  Attempts:  1st Attempt  Reason for unsuccessful TCM follow-up call:  Unable to leave message

## 2021-07-17 NOTE — Telephone Encounter (Signed)
Transition Care Management Follow-up Telephone Call Date of discharge and from where: 07/13/2021 from [facility] How have you been since you were released from the hospital? Pt stated that she is feeing a lot better and did not have any questions or concerns at this time.  Any questions or concerns? No  Items Reviewed: Did the pt receive and understand the discharge instructions provided? Yes  Medications obtained and verified? Yes  Other? No  Any new allergies since your discharge? No  Dietary orders reviewed? No Do you have support at home? Yes   Functional Questionnaire: (I = Independent and D = Dependent) ADLs: I  Bathing/Dressing- I  Meal Prep- I  Eating- I  Maintaining continence- I  Transferring/Ambulation- I  Managing Meds- I   Follow up appointments reviewed:  PCP Hospital f/u appt confirmed? No   Specialist Hospital f/u appt confirmed? No   Are transportation arrangements needed? No  If their condition worsens, is the pt aware to call PCP or go to the Emergency Dept.? Yes Was the patient provided with contact information for the PCP's office or ED? Yes Was to pt encouraged to call back with questions or concerns? Yes

## 2021-08-20 ENCOUNTER — Telehealth (INDEPENDENT_AMBULATORY_CARE_PROVIDER_SITE_OTHER): Payer: Medicaid Other | Admitting: Family Medicine

## 2021-08-20 ENCOUNTER — Encounter: Payer: Self-pay | Admitting: Family Medicine

## 2021-08-20 DIAGNOSIS — K589 Irritable bowel syndrome without diarrhea: Secondary | ICD-10-CM | POA: Diagnosis not present

## 2021-08-20 MED ORDER — DICYCLOMINE HCL 10 MG PO CAPS: 20.0000 mg | ORAL_CAPSULE | Freq: Three times a day (TID) | ORAL | 0 refills | Status: DC

## 2021-08-20 MED ORDER — VIBERZI 75 MG PO TABS: 75.0000 mg | ORAL_TABLET | Freq: Two times a day (BID) | ORAL | 0 refills | Status: DC

## 2021-08-20 NOTE — Progress Notes (Signed)
Name: Tiffany Guzman   MRN: [id number]    DOB: 05/16/1996   Date:08/20/2021       Progress Note  Subjective  Chief Complaint  Chief Complaint  Patient presents with   Medication Problem    I connected with  Tiffany Guzman  on 08/20/21 at  8:20 AM EST by a video enabled telemedicine application and verified that I am speaking with the correct person using two identifiers.  I discussed the limitations of evaluation and management by telemedicine and the availability of in person appointments. The patient expressed understanding and agreed to proceed with the virtual visit  Staff also discussed with the patient that there may be a patient responsible charge related to this service. Patient Location: at work / sitting parking lot  Provider Location: [facility] Additional Individuals present: alone   HPI  IBS: she has been taking Bentyl 20 mg three times a day for the past 6 months and pain has improved/cramping but still has urgency after meals and also watery and loose stools , sometimes with mucus, never with blood. No nocturnal bowel movements  . Pain improves after a bowel movement. She has seen GI in the past and had a normal EGD. She states her symptoms started 4-5 years ago. She states diagnosed with IBS at [facility] Discussed cutting down on Bentyl. We will try adding Viberzi to control IBS symptoms and take Bentyl prn only. Advised to follow up in one month to see how she is doing and access the need to go up on the dose.    Patient Active Problem List   Diagnosis Date Noted   IBS (irritable bowel syndrome) 03/15/2021   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   Bradycardia 02/26/2021   Right ovarian cyst 02/25/2021   Leukocytosis 02/25/2021   Status post ovarian cystectomy 02/25/2021   S/P cholecystectomy    Status post cesarean delivery 11/14/2017   Dermoid cyst 10/03/2017   Dermoid cyst of left ovary 10/01/2017   Anxiety 06/24/2016    Past Surgical History:  Procedure  Laterality Date   CESAREAN SECTION N/A 11/14/2017   Procedure: CESAREAN SECTION;  Surgeon: Will Bonnet, MD;  Location: [facility] ORS;  Service: Obstetrics;  Laterality: N/A;   ESOPHAGOGASTRODUODENOSCOPY Left 02/17/2019   Procedure: ESOPHAGOGASTRODUODENOSCOPY (EGD);  Surgeon: Virgel Manifold, MD;  Location: [facility] ENDOSCOPY;  Service: Endoscopy;  Laterality: Left;   INSERTION OF NON VAGINAL CONTRACEPTIVE DEVICE     LAPAROSCOPIC OVARIAN CYSTECTOMY Right 02/23/2019   Procedure: LAPAROSCOPIC OVARIAN CYSTECTOMY;  Surgeon: Homero Fellers, MD;  Location: [facility] ORS;  Service: Gynecology;  Laterality: Right;   LAPAROSCOPY N/A 02/25/2021   Procedure: LAPAROSCOPY DIAGNOSTIC;  Surgeon: Will Bonnet, MD;  Location: [facility] ORS;  Service: Gynecology;  Laterality: N/A;   OOPHORECTOMY Left 10/03/2017   Procedure: OOPHORECTOMY;  Surgeon: Homero Fellers, MD;  Location: [facility] ORS;  Service: Gynecology;  Laterality: Left;   OVARIAN CYST REMOVAL Left 10/03/2017   Procedure: OVARIAN CYSTECTOMY;  Surgeon: Homero Fellers, MD;  Location: [facility] ORS;  Service: Gynecology;  Laterality: Left;   OVARIAN CYST REMOVAL Right 02/25/2021   Procedure: OVARIAN CYSTECTOMY;  Surgeon: Will Bonnet, MD;  Location: [facility] ORS;  Service: Gynecology;  Laterality: Right;    Family History  Problem Relation Age of Onset   Hypertension Mother    Hypothyroidism Mother    Depression Mother    Diabetes Maternal Grandmother     Social History   Socioeconomic History   Marital status: Single  Spouse name: Not on file   Number of children: Not on file   Years of education: Not on file   Highest education level: Not on file  Occupational History   Not on file  Tobacco Use   Smoking status: Never   Smokeless tobacco: Never  Vaping Use   Vaping Use: Every day   Start date: 09/20/2019   Substances: Nicotine, Flavoring  Substance and Sexual Activity   Alcohol use: No    Alcohol/week: 0.0 standard drinks    Drug use: Yes    Frequency: 7.0 times per week    Types: Marijuana    Comment: has stopped for 10 days   Sexual activity: Yes    Partners: Male    Birth control/protection: None  Other Topics Concern   Not on file  Social History Narrative   Not on file   Social Determinants of Health   Financial Resource Strain: Not on file  Food Insecurity: Not on file  Transportation Needs: Not on file  Physical Activity: Not on file  Stress: Not on file  Social Connections: Not on file  Intimate Partner Violence: Not on file     Current Outpatient Medications:    acetaminophen (TYLENOL) 500 MG tablet, Take 2 tablets (1,000 mg total) by mouth every 6 (six) hours as needed for mild pain., Disp: , Rfl:    dicyclomine (BENTYL) 10 MG capsule, Take 2 capsules (20 mg total) by mouth 4 (four) times daily -  before meals and at bedtime., Disp: 240 capsule, Rfl: 0   Eluxadoline (VIBERZI) 75 MG TABS, Take 75 mg by mouth 2 (two) times daily with a meal., Disp: 60 tablet, Rfl: 0   ibuprofen (ADVIL) 400 MG tablet, Take 1.5 tablets (600 mg total) by mouth every 8 (eight) hours as needed for headache or mild pain., Disp: 10 tablet, Rfl: 0   Multiple Vitamins-Minerals (WOMENS MULTIVITAMIN) TABS, Take 1 tablet by mouth daily., Disp: , Rfl:    OLANZapine (ZYPREXA) 2.5 MG tablet, Take 1 tablet (2.5 mg total) by mouth at bedtime., Disp: 90 tablet, Rfl: 1   pantoprazole (PROTONIX) 40 MG tablet, Take 1 tablet (40 mg total) by mouth daily., Disp: 30 tablet, Rfl: 1  Allergies  Allergen Reactions   Buspar [Buspirone]     Dystonic reaction - arms, legs, jaw "locked up"    I personally reviewed active problem list, medication list, allergies, family history, social history, health maintenance with the patient/caregiver today.   ROS  Ten systems reviewed and is negative except as mentioned in HPI   Objective  Virtual encounter, vitals not obtained.  There is no height or weight on file to calculate  BMI.  Physical Exam  Awake, alert and Oriented and in no distress   PHQ2/9: Depression screen [facility] 2/9 08/20/2021 05/30/2021 03/15/2021 01/03/2021 12/01/2020  Decreased Interest 0 2 1 0 0  Down, Depressed, Hopeless 0 1 2 0 0  PHQ - 2 Score 0 3 3 0 0  Altered sleeping - 0 0 0 -  Tired, decreased energy - 3 0 0 -  Change in appetite - 3 0 0 -  Feeling bad or failure about yourself  - 0 0 0 -  Trouble concentrating - 0 0 0 -  Moving slowly or fidgety/restless - 0 0 0 -  Suicidal thoughts - 0 0 0 -  PHQ-9 Score - 9 3 0 -  Difficult doing work/chores - Somewhat difficult Not difficult at all Not difficult at all -  Some recent data might be hidden   PHQ-2/9 Result is negative.    Fall Risk: Fall Risk  08/20/2021 05/30/2021 03/15/2021 01/03/2021 12/01/2020  Falls in the past year? 0 0 0 0 0  Number falls in past yr: 0 0 0 0 0  Injury with Fall? 0 0 0 0 0  Risk for fall due to : No Fall Risks No Fall Risks - - -  Follow up Falls prevention discussed Falls prevention discussed Falls evaluation completed - Falls evaluation completed     Assessment & Plan  1. Irritable bowel syndrome, unspecified type  - dicyclomine (BENTYL) 10 MG capsule; Take 2 capsules (20 mg total) by mouth 4 (four) times daily -  before meals and at bedtime.  Dispense: 240 capsule; Refill: 0 - Eluxadoline (VIBERZI) 75 MG TABS; Take 75 mg by mouth 2 (two) times daily with a meal.  Dispense: 60 tablet; Refill: 0   Explained if symptoms do not improve we will have to refer her back to GI , also discuss possible diagnosis of SIBO  I discussed the assessment and treatment plan with the patient. The patient was provided an opportunity to ask questions and all were answered. The patient agreed with the plan and demonstrated an understanding of the instructions.  The patient was advised to call back or seek an in-person evaluation if the symptoms worsen or if the condition fails to improve as anticipated.  I provided 15  minutes of non-face-to-face time during this encounter.

## 2021-08-24 ENCOUNTER — Other Ambulatory Visit: Payer: Self-pay | Admitting: Family Medicine

## 2021-08-24 DIAGNOSIS — K589 Irritable bowel syndrome without diarrhea: Secondary | ICD-10-CM

## 2021-08-24 MED ORDER — OLANZAPINE 2.5 MG PO TABS: 2.5000 mg | ORAL_TABLET | Freq: Every day | ORAL | 1 refills | Status: DC

## 2021-08-24 NOTE — Telephone Encounter (Signed)
Medication Refill - Medication: OLANZapine (ZYPREXA) 2.5 MG tablet  Pt also called to see if the approval process for Eluxadoline (VIBERZI) 75 MG TABS was done or started/ please advise   Has the patient contacted their pharmacy? Yes.   (Agent: If no, request that the patient contact the pharmacy for the refill. If patient does not wish to contact the pharmacy document the reason why and proceed with request.) (Agent: If yes, when and what did the pharmacy advise?)call pcp  Preferred Pharmacy (with phone number or street name): [facility] [address]  Has the patient been seen for an appointment in the last year OR does the patient have an upcoming appointment? Yes.    Agent: Please be advised that RX refills may take up to 3 business days. We ask that you follow-up with your pharmacy.

## 2021-08-24 NOTE — Telephone Encounter (Signed)
Requested medication (s) are due for refill today: yes - zyprexa, no viberezi  Requested medication (s) are on the active medication list: yes  Last refill:  05/30/21 #90 1 refills   Future visit scheduled: no last seen 4 days ago   Notes to clinic:  not delegated per protocol. No lipid panel within last 12 months . Do you want to refill Rx? Vibrezi signed 4 days ago      Requested Prescriptions  Pending Prescriptions Disp Refills   OLANZapine (ZYPREXA) 2.5 MG tablet 90 tablet 1    Sig: Take 1 tablet (2.5 mg total) by mouth at bedtime.     Not Delegated - Psychiatry:  Antipsychotics - Second Generation (Atypical) - olanzapine Failed - 08/24/2021  1:49 PM      Failed - This refill cannot be delegated      Failed - Lipid Panel in normal range within the last 12 months    No results found for: CHOL, POCCHOL, CHOLTOT No results found for: LDLCALC, LDLC, HIRISKLDL, POCLDL, LDLDIRECT, REALLDLC, TOTLDLC No results found for: HDL, POCHDL No results found for: TRIG, POCTRIG       Passed - TSH in normal range and within 360 days    TSH  Date Value Ref Range Status  02/26/2021 0.476 0.350 - 4.500 uIU/mL Final    Comment:    Performed by a 3rd Generation assay with a functional sensitivity of <=0.01 uIU/mL. Performed at [facility], [address] [place], [place] [address]   06/24/2016 1.53 mIU/L Final    Comment:      Reference Range   > or = 20 Years  0.40-4.50   Pregnancy Range First trimester  0.26-2.66 Second trimester 0.55-2.73 Third trimester  0.43-2.91             Passed - Completed PHQ-2 or PHQ-9 in the last 360 days      Passed - Last BP in normal range    BP Readings from Last 1 Encounters:  07/13/21 119/69          Passed - Last Heart Rate in normal range    Pulse Readings from Last 1 Encounters:  07/13/21 (!) 59          Passed - Valid encounter within last 6 months    Recent Outpatient Visits           4 days ago Irritable bowel  syndrome, unspecified type   [facility] Steele Sizer, MD   2 months ago [place], DO   5 months ago [place], NP   7 months ago Febrile illness   [facility] [name], PA-C   8 months ago Cholecystitis   [facility] [place], Drue Stager, MD              Passed - CBC within normal limits and completed in the last 12 months    WBC  Date Value Ref Range Status  07/13/2021 5.4 4.0 - 10.5 K/uL Final   RBC  Date Value Ref Range Status  07/13/2021 4.33 3.87 - 5.11 MIL/uL Final   Hemoglobin  Date Value Ref Range Status  07/13/2021 12.2 12.0 - 15.0 g/dL Final  09/09/2017 10.3 (L) 11.1 - 15.9 g/dL Final   HCT  Date Value Ref Range Status  07/13/2021 37.2 36.0 - 46.0 % Final   Hematocrit  Date Value Ref  Range Status  09/09/2017 30.5 (L) 34.0 - 46.6 % Final   MCHC  Date Value Ref Range Status  07/13/2021 32.8 30.0 - 36.0 g/dL Final   [facility]  Date Value Ref Range Status  07/13/2021 28.2 26.0 - 34.0 pg Final   MCV  Date Value Ref Range Status  07/13/2021 85.9 80.0 - 100.0 fL Final  09/09/2017 85 79 - 97 fL Final  06/26/2014 86 80 - 100 fL Final   No results found for: PLTCOUNTKUC, LABPLAT, POCPLA RDW  Date Value Ref Range Status  07/13/2021 12.5 11.5 - 15.5 % Final  09/09/2017 13.5 12.3 - 15.4 % Final  06/26/2014 13.2 11.5 - 14.5 % Final         Passed - CMP within normal limits and completed in the last 12 months    Albumin  Date Value Ref Range Status  07/13/2021 4.2 3.5 - 5.0 g/dL Final  06/26/2014 4.6 3.8 - 5.6 g/dL Final   Alkaline Phosphatase  Date Value Ref Range Status  07/13/2021 72 38 - 126 U/L Final  06/26/2014 128 (H) Unit/L Final    Comment:    46-116 NOTE: New Reference Range 02/08/14    Alkaline phosphatase (APISO)  Date Value Ref Range Status  08/11/2019 52 31 - 125 U/L Final    ALT  Date Value Ref Range Status  07/13/2021 13 0 - 44 U/L Final   SGPT (ALT)  Date Value Ref Range Status  06/26/2014 28 U/L Final    Comment:    14-63 NOTE: New Reference Range 02/08/14    AST  Date Value Ref Range Status  07/13/2021 18 15 - 41 U/L Final   SGOT(AST)  Date Value Ref Range Status  06/26/2014 28 (H) 0 - 26 Unit/L Final   BUN  Date Value Ref Range Status  07/13/2021 10 6 - 20 mg/dL Final  06/26/2014 12 9 - 21 mg/dL Final   Calcium  Date Value Ref Range Status  07/13/2021 8.7 (L) 8.9 - 10.3 mg/dL Final   Calcium, Total  Date Value Ref Range Status  06/26/2014 8.7 (L) 9.0 - 10.7 mg/dL Final   CO2  Date Value Ref Range Status  07/13/2021 26 22 - 32 mmol/L Final   Co2  Date Value Ref Range Status  06/26/2014 27 (H) 16 - 25 mmol/L Final   Creat  Date Value Ref Range Status  08/11/2019 0.81 0.50 - 1.10 mg/dL Final   Creatinine, Ser  Date Value Ref Range Status  07/13/2021 0.75 0.44 - 1.00 mg/dL Final   Creatinine, Urine  Date Value Ref Range Status  11/14/2017 20 mg/dL Final   Glucose  Date Value Ref Range Status  06/26/2014 95 65 - 99 mg/dL Final   Glucose, Bld  Date Value Ref Range Status  07/13/2021 99 70 - 99 mg/dL Final    Comment:    Glucose reference range applies only to samples taken after fasting for at least 8 hours.   Glucose-Capillary  Date Value Ref Range Status  03/06/2021 75 70 - 99 mg/dL Final    Comment:    Glucose reference range applies only to samples taken after fasting for at least 8 hours.   Potassium  Date Value Ref Range Status  07/13/2021 3.5 3.5 - 5.1 mmol/L Final  06/26/2014 3.8 3.3 - 4.7 mmol/L Final   Sodium  Date Value Ref Range Status  07/13/2021 139 135 - 145 mmol/L Final  06/26/2014 142 (H) 132 - 141 mmol/L Final  Total Bilirubin  Date Value Ref Range Status  07/13/2021 0.4 0.3 - 1.2 mg/dL Final   Bilirubin,Total  Date Value Ref Range Status  06/26/2014 0.4 0.2 - 1.0 mg/dL Final    Bilirubin, Direct  Date Value Ref Range Status  08/04/2019 0.1 0.0 - 0.2 mg/dL Final   Indirect Bilirubin  Date Value Ref Range Status  08/04/2019 1.5 (H) 0.3 - 0.9 mg/dL Final    Comment:    Performed at [facility], [address] [place], [place] [address]   Protein, ur  Date Value Ref Range Status  07/13/2021 NEGATIVE NEGATIVE mg/dL Final   Protein, 24H Urine  Date Value Ref Range Status  10/06/2017 273 (H) 50 - 100 mg/day Final    Comment:    Performed at [facility], [address] [place], [place] [address]   Protein, Urine  Date Value Ref Range Status  10/06/2017 14 mg/dL Final   Total Protein, Urine  Date Value Ref Range Status  11/14/2017 8 mg/dL Final    Comment:    NO NORMAL RANGE ESTABLISHED FOR THIS TEST   Total Protein  Date Value Ref Range Status  07/13/2021 7.6 6.5 - 8.1 g/dL Final  06/26/2014 8.8 (H) 6.4 - 8.6 g/dL Final   GFR, Est African American  Date Value Ref Range Status  08/11/2019 119 > OR = 60 mL/min/1.38m2 Final   GFR, Est Non African American  Date Value Ref Range Status  08/11/2019 102 > OR = 60 mL/min/1.59m2 Final   GFR, Estimated  Date Value Ref Range Status  07/13/2021 >60 >60 mL/min Final    Comment:    (NOTE) Calculated using the CKD-EPI Creatinine Equation (2021)           Eluxadoline (VIBERZI) 75 MG TABS 60 tablet 0    Sig: Take 75 mg by mouth 2 (two) times daily with a meal.     Off-Protocol Failed - 08/24/2021  1:49 PM      Failed - Medication not assigned to a protocol, review manually.      Passed - Valid encounter within last 12 months    Recent Outpatient Visits           4 days ago Irritable bowel syndrome, unspecified type   [facility] Steele Sizer, MD   2 months ago [place], DO   5 months ago [place], NP   7 months ago Febrile illness   [facility] [name], PA-C   8 months ago Cholecystitis   [facility] Steele Sizer, MD

## 2021-08-29 ENCOUNTER — Telehealth: Payer: Self-pay | Admitting: Family Medicine

## 2021-08-29 NOTE — Telephone Encounter (Signed)
Called pharmacy to ask about her Viberzi medication, pharmacist stated to me pt will only need to pay $4.00 out of pocket. Called pt to inform her and she stated will pick it up.

## 2021-08-29 NOTE — Telephone Encounter (Signed)
Pt states that insurance will not cover dicyclomine (BENTYL) 10 MG capsule.She states she really does not want to start this med  She states she has been prescribed Zofran before and would like that called into [id number] - [place], [place] - [address]  [address], [place] [address]  Phone:  [phone]  Fax:  [phone]  DEA #:  [id number]

## 2021-08-29 NOTE — Telephone Encounter (Signed)
Pt states she does not want to go to GI since last time she visited  was told if she is not having any heartburn or stomach issues she would have to follow up with PCP office. Pt states she has been feeling nauseous these past few days and would like some Zofran prescribed.

## 2021-08-29 NOTE — Telephone Encounter (Signed)
Pt prefers Zofran instead. Please advice

## 2021-08-29 NOTE — Telephone Encounter (Signed)
Patient is requesting medication change due to insurance coverage- request sent to office for review

## 2021-08-29 NOTE — Telephone Encounter (Signed)
Pt states she never tried [place] due to her insurance not covering it. I advised her to schedule an appointment if continuing feeling nauseous, pt states she will call if she needs to.

## 2021-09-07 ENCOUNTER — Ambulatory Visit: Payer: Medicaid Other

## 2021-09-10 ENCOUNTER — Ambulatory Visit: Payer: Medicaid Other | Admitting: Obstetrics and Gynecology

## 2021-09-17 ENCOUNTER — Ambulatory Visit: Payer: Medicaid Other | Admitting: Obstetrics and Gynecology

## 2021-09-27 ENCOUNTER — Ambulatory Visit: Payer: Medicaid Other

## 2021-09-27 DIAGNOSIS — N83201 Unspecified ovarian cyst, right side: Secondary | ICD-10-CM

## 2021-10-05 ENCOUNTER — Other Ambulatory Visit: Payer: Self-pay | Admitting: *Deleted

## 2021-10-05 ENCOUNTER — Other Ambulatory Visit: Payer: Self-pay

## 2021-10-05 NOTE — Patient Instructions (Signed)
Visit Information ? ?Ms. Elsey was given information about Medicaid Managed Care team care coordination services as a part of their Healthy [employer] Medicaid benefit. Shamari Lon Norris verbally consented to engagement with the [facility] Managed Care team.  ? ?If you are experiencing a medical emergency, please call 911 or report to your local emergency department or urgent care.  ? ?If you have a non-emergency medical problem during routine business hours, please contact your provider's office and ask to speak with a nurse.  ? ?For questions related to your Healthy [facility] health plan, please call: [phone] or visit the homepage here: [email or web address] ? ?If you would like to schedule transportation through your Healthy [facility] plan, please call the following number at least 2 days in advance of your appointment: [phone] ? For information about your ride after you set it up, call Ride Assist at [phone]. Use this number to activate a Will Call pickup, or if your transportation is late for a scheduled pickup. Use this number, too, if you need to make a change or cancel a previously scheduled reservation. ? If you need transportation services right away, call [phone]. The after-hours call center is staffed 24 hours to handle ride assistance and urgent reservation requests (including discharges) 365 days a year. Urgent trips include sick visits, hospital discharge requests and life-sustaining treatment. ? ?Call the [place] at [phone], at any time, 24 hours a day, 7 days a week. If you are in danger or need immediate medical attention call 911. ? ?If you would like help to quit smoking, call 1-800-QUIT-NOW 616-488-0241) OR Espa?ol: 1-855-D?jelo-Ya 281-248-8898) o para m?s informaci?n haga clic aqu? or Text READY to 200-400 to register via text ? ?Ms. Drue Flirt, ? ? ?Please see education materials related to IBS and  anxiety provided by MyChart link. ? ?Patient verbalizes understanding of instructions and care plan provided today and agrees to view in [place]. Active MyChart status confirmed with patient.   ? ?Telephone follow up appointment with Managed Medicaid care management team member scheduled for:11/06/21 @ 3:30pm ? ?Lurena Joiner RN, BSN ?[place] ?RN Care Coordinator ? ? ?Following is a copy of your plan of care:  ?Care Plan : [place] of Care  ?Updates made by [name], RN since 10/05/2021 12:00 AM  ?  ? ?Problem: Health Management needs related to IBS and anxiety   ?  ? ?Long-Range Goal: Development of Plan of Care to address Health Management needs related to IBS and anxiety   ?Start Date: 10/05/2021  ?Expected End Date: 01/03/2022  ?This Visit's Progress: On track  ?Priority: High  ?Note:   ?Current Barriers:  ?Chronic Disease Management support and education needs related to IBS and Anxiety ? ?RNCM Clinical Goal(s):  ?Patient will verbalize understanding of plan for management of Anxiety and IBS as evidenced by patient verbalization of self monitoring activities ?take all medications exactly as prescribed and will call provider for medication related questions as evidenced by documentation in EMR    ?work with Education officer, museum to address [place] Concerns  related to the management of Anxiety as evidenced by review of EMR and patient or social worker report     through collaboration with Consulting civil engineer, provider, and care team.  ? ?Interventions: ?Inter-disciplinary care team collaboration (see longitudinal plan of care) ?Evaluation of current treatment plan related to  self management and patient's adherence to plan as established by provider ? ? ?IBS  (  Status: New goal.) Long Term Goal  ?Evaluation of current treatment plan related to  IBS ,  self-management and patient's adherence to plan as established by provider. ?Discussed plans with patient for ongoing care  management follow up and provided patient with direct contact information for care management team ?Provided education to patient re: IBS foods to eat and foods to avoid; ?Reviewed medications with patient and discussed Viberzi and the importance of trying this medication and follow up with PCP after taking for 1 month; ?Reviewed scheduled/upcoming provider appointments including 3/30 for pelvic ultrasound and 4/3 for GYN visit; ?Discussed plans with patient for ongoing care management follow up and provided patient with direct contact information for care management team; ?Discussed healthy diet, including cutting down on soda, increase water intake, increase fruits and vegetables and cutting down on eating out ?Advised patient to pick up  ? ? ?Anxiety  (Status:  New goal.)  Short Term Goal ?Evaluation of current treatment plan related to Anxiety,  self-management and patient's adherence to plan as established by provider. ?Discussed plans with patient for ongoing care management follow up and provided patient with direct contact information for care management team ?Collaborated with Jerene Pitch, [place] regarding managing anxiety, scheduled patient to meet with Jerene Pitch on 10/11/21 @ 3pm ?Discussed plans with patient for ongoing care management follow up and provided patient with direct contact information for care management team ?Provided education re: managing anxiety ? ?Patient Goals/Self-Care Activities: ?Take medications as prescribed   ?Attend all scheduled provider appointments ?Call provider office for new concerns or questions  ?Work with the Education officer, museum to address care coordination needs and will continue to work with the clinical team to address health care and disease management related needs ?go to [facility] [place] [phone]) if experiencing a Mental Health or [place]  ?Improve diet as appropriate for IBS ? ? ?  ? ?  ?

## 2021-10-05 NOTE — Patient Outreach (Signed)
?Medicaid Managed Care   ?Nurse Care Manager Note ? ?10/05/2021 ?Name:  Tiffany Guzman MRN:  [id number] DOB:  [birth year] ? ?Tiffany Guzman is an 26 y.o. year old female who is a primary patient of Tiffany Guzman, [place].  The [facility] Managed Care Coordination team was consulted for assistance with:    ?Anxiety ?IBS ? ?[name] was given information about Medicaid Managed Care Coordination team services today. [place] Patient agreed to services and verbal consent obtained. ? ?Engaged with patient by telephone for initial visit in response to provider referral for case management and/or care coordination services.  ? ?Assessments/Interventions:  Review of past medical history, allergies, medications, health status, including review of consultants reports, laboratory and other test data, was performed as part of comprehensive evaluation and provision of chronic care management services. ? ?SDOH (Social Determinants of Health) assessments and interventions performed: ?SDOH Interventions   ? ?Flowsheet Row Most Recent Value  ?SDOH Interventions   ?Food Insecurity Interventions Intervention Not Indicated  ?Housing Interventions Intervention Not Indicated  ?Transportation Interventions Intervention Not Indicated  ? ?  ? ? ?Care Plan ? ?Allergies  ?Allergen Reactions  ? Buspar [Buspirone]   ?  Dystonic reaction - arms, legs, jaw "locked up"  ? ? ?Medications Reviewed Today   ? ? Reviewed by [name], RN (Registered Nurse) on 10/05/21 at 1412  Med List Status: <None>  ? ?Medication Order Taking? Sig Documenting Provider Last Dose Status Informant  ?acetaminophen (TYLENOL) 500 MG tablet [id number] Yes Take 2 tablets (1,000 mg total) by mouth every 6 (six) hours as needed for mild pain. Olean Ree, MD Taking Active Multiple Informants  ?dicyclomine (BENTYL) 10 MG capsule [id number] Yes Take 2 capsules (20 mg total) by mouth 4 (four) times daily -  before meals and at bedtime. Steele Sizer, MD  Taking Active   ?Eluxadoline (VIBERZI) 75 MG TABS [id number] No Take 75 mg by mouth 2 (two) times daily with a meal.  ?Patient not taking: Reported on 10/05/2021  ? Steele Sizer, MD Not Taking Active   ?         ?Med Note (Kamauri Denardo A   Fri Oct 05, 2021  2:11 PM) Has not picked up and started taking  ?ibuprofen (ADVIL) 400 MG tablet [id number] Yes Take 1.5 tablets (600 mg total) by mouth every 8 (eight) hours as needed for headache or mild pain. Fritzi Mandes, MD Taking Active   ?Multiple Vitamins-Minerals (WOMENS MULTIVITAMIN) TABS [id number] Yes Take 1 tablet by mouth daily. [provider] Taking Active Multiple Informants  ?OLANZapine (ZYPREXA) 2.5 MG tablet [id number] Yes Take 1 tablet (2.5 mg total) by mouth at bedtime. [name], [name], PA-C Taking Active   ?pantoprazole (PROTONIX) 40 MG tablet [id number] No Take 1 tablet (40 mg total) by mouth daily.  ?Patient not taking: Reported on 10/05/2021  ? Will Bonnet, MD Not Taking Active   ? ?  ?  ? ?  ? ? ?Patient Active Problem List  ? Diagnosis Date Noted  ? IBS (irritable bowel syndrome) 03/15/2021  ? 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 Bradycardia 02/26/2021  ? Right ovarian cyst 02/25/2021  ? Leukocytosis 02/25/2021  ? 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 S/P cholecystectomy   ? Status post cesarean delivery 11/14/2017  ? Dermoid cyst 10/03/2017  ? Dermoid cyst of left ovary 10/01/2017  ? Anxiety 06/24/2016  ? ? ?Conditions to be addressed/monitored per PCP order:  Anxiety and IBS ? ?Care Plan :  RN Care Manager Plan of Care  ?Updates made by [name], RN since 10/05/2021 12:00 AM  ?  ? ?Problem: Health Management needs related to IBS and anxiety   ?  ? ?Long-Range Goal: Development of Plan of Care to address Health Management needs related to IBS and anxiety   ?Start Date: 10/05/2021  ?Expected End Date: 01/03/2022  ?This Visit's Progress: On track  ?Priority: High  ?Note:   ?Current Barriers:  ?Chronic Disease Management support and  education needs related to IBS and Anxiety ? ?RNCM Clinical Goal(s):  ?Patient will verbalize understanding of plan for management of Anxiety and IBS as evidenced by patient verbalization of self monitoring activities ?take all medications exactly as prescribed and will call provider for medication related questions as evidenced by documentation in EMR    ?work with Education officer, museum to address [place] Concerns  related to the management of Anxiety as evidenced by review of EMR and patient or social worker report     through collaboration with Consulting civil engineer, provider, and care team.  ? ?Interventions: ?Inter-disciplinary care team collaboration (see longitudinal plan of care) ?Evaluation of current treatment plan related to  self management and patient's adherence to plan as established by provider ? ? ?IBS  (Status: New goal.) Long Term Goal  ?Evaluation of current treatment plan related to  IBS ,  self-management and patient's adherence to plan as established by provider. ?Discussed plans with patient for ongoing care management follow up and provided patient with direct contact information for care management team ?Provided education to patient re: IBS foods to eat and foods to avoid; ?Reviewed medications with patient and discussed Viberzi and the importance of trying this medication and follow up with PCP after taking for 1 month; ?Reviewed scheduled/upcoming provider appointments including 3/30 for pelvic ultrasound and 4/3 for GYN visit; ?Discussed plans with patient for ongoing care management follow up and provided patient with direct contact information for care management team; ?Discussed healthy diet, including cutting down on soda, increase water intake, increase fruits and vegetables and cutting down on eating out ?Advised patient to pick up  ? ? ?Anxiety  (Status:  New goal.)  Short Term Goal ?Evaluation of current treatment plan related to Anxiety,  self-management and patient's adherence to plan  as established by provider. ?Discussed plans with patient for ongoing care management follow up and provided patient with direct contact information for care management team ?Collaborated with Jerene Pitch, [place] regarding managing anxiety, scheduled patient to meet with Jerene Pitch on 10/11/21 @ 3pm ?Discussed plans with patient for ongoing care management follow up and provided patient with direct contact information for care management team ?Provided education re: managing anxiety ? ?Patient Goals/Self-Care Activities: ?Take medications as prescribed   ?Attend all scheduled provider appointments ?Call provider office for new concerns or questions  ?Work with the Education officer, museum to address care coordination needs and will continue to work with the clinical team to address health care and disease management related needs ?go to [facility] [place] [phone]) if experiencing a Mental Health or [place]  ?Improve diet as appropriate for IBS ? ? ?  ? ? ?Follow Up:  Patient agrees to Care Plan and Follow-up. ? ?Plan: The Managed Medicaid care management team will reach out to the patient again over the next 30 days. ? ?Date/time of next scheduled RN care management/care coordination outreach:  11/06/21 @ 3:30pm ? ?Lurena Joiner RN, BSN ?[place]  [place] ?RN Care Coordinator ? ?

## 2021-10-08 ENCOUNTER — Ambulatory Visit: Payer: Medicaid Other | Admitting: Obstetrics and Gynecology

## 2021-10-11 ENCOUNTER — Other Ambulatory Visit: Payer: Self-pay | Admitting: Licensed Clinical Social Worker

## 2021-10-11 DIAGNOSIS — F419 Anxiety disorder, unspecified: Secondary | ICD-10-CM

## 2021-10-11 DIAGNOSIS — F509 Eating disorder, unspecified: Secondary | ICD-10-CM

## 2021-10-11 NOTE — Patient Outreach (Signed)
?Medicaid Managed Care ?Social Work Note ? ?10/11/2021 ?Name:  Tiffany Guzman MRN:  [id number] DOB:  03/24/1996 ? ?Tiffany Guzman is an 26 y.o. year old female who is a primary patient of [name], [place].  The [facility] Managed Care Coordination team was consulted for assistance with:  Mental Health Counseling and Resources ? ?[name] was given information about Medicaid Managed Care Coordination team services today. [name] agreed to services and verbal consent obtained. ? ?Engaged with patient  for by telephone forinitial visit in response to referral for case management and/or care coordination services.  ? ?Assessments/Interventions:  Review of past medical history, allergies, medications, health status, including review of consultants reports, laboratory and other test data, was performed as part of comprehensive evaluation and provision of chronic care management services. ? ?SDOH: (Social Determinant of Health) assessments and interventions performed: ? ? ?Advanced Directives Status:  Not addressed in this encounter. ? ?Care Plan ?                ?Allergies  ?Allergen Reactions  ? Buspar [Buspirone]   ?  Dystonic reaction - arms, legs, jaw "locked up"  ? ? ?Medications Reviewed Today   ? ? Reviewed by [name], RN (Registered Nurse) on 10/05/21 at 1412  Med List Status: <None>  ? ?Medication Order Taking? Sig Documenting Provider Last Dose Status Informant  ?acetaminophen (TYLENOL) 500 MG tablet [id number] Yes Take 2 tablets (1,000 mg total) by mouth every 6 (six) hours as needed for mild pain. Olean Ree, MD Taking Active Multiple Informants  ?dicyclomine (BENTYL) 10 MG capsule [id number] Yes Take 2 capsules (20 mg total) by mouth 4 (four) times daily -  before meals and at bedtime. Steele Sizer, MD Taking Active   ?Eluxadoline (VIBERZI) 75 MG TABS [id number] No Take 75 mg by mouth 2 (two) times daily with a meal.  ?Patient not taking: Reported on 10/05/2021  ?  Steele Sizer, MD Not Taking Active   ?         ?Med Note (ROBB, MELANIE A   Fri Oct 05, 2021  2:11 PM) Has not picked up and started taking  ?ibuprofen (ADVIL) 400 MG tablet [id number] Yes Take 1.5 tablets (600 mg total) by mouth every 8 (eight) hours as needed for headache or mild pain. Fritzi Mandes, MD Taking Active   ?Multiple Vitamins-Minerals (WOMENS MULTIVITAMIN) TABS [id number] Yes Take 1 tablet by mouth daily. [provider] Taking Active Multiple Informants  ?OLANZapine (ZYPREXA) 2.5 MG tablet [id number] Yes Take 1 tablet (2.5 mg total) by mouth at bedtime. [name], [name], PA-C Taking Active   ?pantoprazole (PROTONIX) 40 MG tablet [id number] No Take 1 tablet (40 mg total) by mouth daily.  ?Patient not taking: Reported on 10/05/2021  ? Will Bonnet, MD Not Taking Active   ? ?  ?  ? ?  ? ? ?Patient Active Problem List  ? Diagnosis Date Noted  ? IBS (irritable bowel syndrome) 03/15/2021  ? 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 Bradycardia 02/26/2021  ? Right ovarian cyst 02/25/2021  ? Leukocytosis 02/25/2021  ? 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 S/P cholecystectomy   ? Status post cesarean delivery 11/14/2017  ? Dermoid cyst 10/03/2017  ? Dermoid cyst of left ovary 10/01/2017  ? Anxiety 06/24/2016  ? ? ?Conditions to be addressed/monitored per PCP order:  Anxiety ? ?Care Plan : LCSW plan of care  ?Updates made by Greg Cutter, LCSW since 10/11/2021 12:00 AM  ?  ? ?  Problem: Anxiety Identification (Anxiety)   ?  ? ?Long-Range Goal: Anxiety Symptoms Identified   ?Start Date: 10/11/2021  ?Priority: High  ?Note:   ?Priority: High ? ?Timeframe:  Long-Range Goal ?Priority:  High ?Start Date:   10/11/21                ?Expected End Date:  ongoing                   ?  ?Follow Up Date--11/01/21 ? ?- check out counseling ?- keep 90 percent of counseling appointments ?- schedule counseling appointment  ?  ?Why is this important?   ?          Beating depression may take some time.  ?          If you  don't feel better right away, don't give up on your treatment plan.  ?  ?Current barriers:   ?          Chronic Mental Health needs related to stress management, anxiety, eating disorder and ADHD   ?          Needs Support, Education, and Care Coordination in order to meet unmet mental health needs. ? ?Clinical Goal(s): demonstrate a reduction in symptoms related to :  connecting with provider for ongoing mental health treatment, increase coping skills, combating negativity with positive thoughts, work on creating structure and healthy habits, improve self-management skills and stress reduction     ? ?Clinical Interventions:  ?          Assessed patient's previous and current treatment, coping skills, support system and barriers to care  ??         Depression screen reviewed, PHQ 2   ??         Solution-Focused Strategies ??         Mindfulness or Relaxation Training ??         Active listening / Reflection utilized  ??         Emotional Supportive Provided ??         Behavioral Activation ??         Participation in counseling encouraged  ??         Verbalization of feelings encouraged  ??         Crisis Resource Education / information provided  ??         Suicidal Ideation/Homicidal Ideation assessed: No SI/HI ??         CBT intervention implemented ??         Discussed referral for counseling and psychiatry. Patient she is been on Olanzapine for 6 months. She admits that she does have manic episodes. She shares that she has both general and social anxiety.  ?          Reviewed various resources and discussed options on available treatment options. Patient is agreeable to referral for counseling and psychiatry. [facility] LCSW made referral to [facility] on 10/11/21. [facility] LCSW educated patient on the power of thinking positive.  ??         Options for mental health treatment based on need and insurance ?          Inter-disciplinary care team collaboration (see longitudinal plan of care) ?          LCSW discussed coping  skills for anxiety and depression. SW used empathetic and active and reflective listening, validated feelings/concerns, and provided emotional support. LCSW provided self-care education to help manage  her mental health conditions and improve her mood.  ?          Patient denies any current crises or urgent needs ? ?Patient Goals/Self-Care Activities: Over the next 120 days ?          Call your insurance provider for more information about your Enhanced Benefits  ?   ? ?SDOH Screenings  ? ?Alcohol Screen: Low Risk   ? Last Alcohol Screening Score (AUDIT): 0  ?Depression (PHQ2-9): Low Risk   ? PHQ-2 Score: 1  ?Financial Resource Strain: Not on file  ?Food Insecurity: No Food Insecurity  ? Worried About Charity fundraiser in the Last Year: Never true  ? Ran Out of Food in the Last Year: Never true  ?Housing: Low Risk   ? Last Housing Risk Score: 0  ?Physical Activity: Not on file  ?Social Connections: Not on file  ?Stress: Not on file  ?Tobacco Use: Low Risk   ? Smoking Tobacco Use: Never  ? Smokeless Tobacco Use: Never  ? Passive Exposure: Not on file  ?Transportation Needs: No Transportation Needs  ? Lack of Transportation (Medical): No  ? Lack of Transportation (Non-Medical): No  ? ? ?  10/11/2021  ?  3:06 PM 08/20/2021  ?  8:21 AM 05/30/2021  ? 10:05 AM 03/15/2021  ?  1:52 PM 01/03/2021  ?  3:40 PM  ?Depression screen PHQ 2/9  ?Decreased Interest 0 0 2 1 0  ?Down, Depressed, Hopeless 1 0 1 2 0  ?PHQ - 2 Score 1 0 3 3 0  ?Altered sleeping   0 0 0  ?Tired, decreased energy   3 0 0  ?Change in appetite   3 0 0  ?Feeling bad or failure about yourself    0 0 0  ?Trouble concentrating   0 0 0  ?Moving slowly or fidgety/restless   0 0 0  ?Suicidal thoughts   0 0 0  ?PHQ-9 Score   9 3 0  ?Difficult doing work/chores   Somewhat difficult Not difficult at all Not difficult at all  ? ?10 LITTLE Things To Do When You?re Feeling Too Down To Do Anything ? ?Take a shower. ?Even if you plan to stay in all day long and not see a soul,  take a shower. It takes the most effort to hop in to the shower but once you do, you?ll feel immediate results. It will wake you up and you?ll be feeling much fresher (and cleaner too). ? ?Brush and floss your

## 2021-10-11 NOTE — Patient Instructions (Signed)
Visit Information ? ?Ms. Lungren was given information about Medicaid Managed Care team care coordination services as a part of their [employer] Medicaid benefit. Stepahnie Lon Lieurance verbally consented to engagement with the [facility] Managed Care team.  ? ?If you are experiencing a medical emergency, please call 911 or report to your local emergency department or urgent care.  ? ?If you have a non-emergency medical problem during routine business hours, please contact your provider's office and ask to speak with a nurse.  ? ?For questions related to your Healthy [facility] health plan, please call: [phone] or visit the homepage here: [email or web address] ? ?If you would like to schedule transportation through your [facility] plan, please call the following number at least 2 days in advance of your appointment: [phone] ? For information about your ride after you set it up, call Ride Assist at [phone]. Use this number to activate a Will Call pickup, or if your transportation is late for a scheduled pickup. Use this number, too, if you need to make a change or cancel a previously scheduled reservation. ? If you need transportation services right away, call [phone]. The after-hours call center is staffed 24 hours to handle ride assistance and urgent reservation requests (including discharges) 365 days a year. Urgent trips include sick visits, hospital discharge requests and life-sustaining treatment. ? ?Call the [place] at [phone], at any time, 24 hours a day, 7 days a week. If you are in danger or need immediate medical attention call 911. ? ?If you would like help to quit smoking, call 1-800-QUIT-NOW 817-738-3835) OR Espa?ol: 1-855-D?jelo-Ya 516-548-8842) o para m?s informaci?n haga clic aqu? or Text READY to 200-400 to register via text ? ? ? ?Following is a copy of your plan of care:  ?Care Plan : LCSW plan of  care  ?Updates made by Greg Cutter, LCSW since 10/11/2021 12:00 AM  ?  ? ?Problem: Anxiety Identification (Anxiety)   ?  ? ?Long-Range Goal: Anxiety Symptoms Identified   ?Start Date: 10/11/2021  ?Priority: High  ?Note:   ?Priority: High ? ?Timeframe:  Long-Range Goal ?Priority:  High ?Start Date:   10/11/21                ?Expected End Date:  ongoing                   ?  ?Follow Up Date--11/01/21 ? ?- check out counseling ?- keep 90 percent of counseling appointments ?- schedule counseling appointment  ?  ?Why is this important?   ?          Beating depression may take some time.  ?          If you don't feel better right away, don't give up on your treatment plan.  ?  ?Eula Fried, BSW, MSW, LCSW ?Managed Medicaid LCSW ?[facility] ?[email or web address] ?Phone: [phone] ? ?  ? ?  ?

## 2021-10-18 ENCOUNTER — Ambulatory Visit (INDEPENDENT_AMBULATORY_CARE_PROVIDER_SITE_OTHER): Payer: Medicaid Other

## 2021-10-18 DIAGNOSIS — N83201 Unspecified ovarian cyst, right side: Secondary | ICD-10-CM | POA: Diagnosis not present

## 2021-10-19 ENCOUNTER — Other Ambulatory Visit: Payer: Medicaid Other | Admitting: Licensed Clinical Social Worker

## 2021-10-19 ENCOUNTER — Ambulatory Visit: Payer: Self-pay | Admitting: Licensed Clinical Social Worker

## 2021-10-19 NOTE — Patient Outreach (Deleted)
?Medicaid Managed Care ?Social Work Note ? ?10/19/2021 ?Name:  Tiffany Guzman MRN:  [id number] DOB:  02/11/1996 ? ?Tiffany Guzman is an 26 y.o. year old female who is a primary patient of [name], [place].  The [facility] Managed Care Coordination team was consulted for assistance with:  Mental Health Counseling and Resources ? ?Ms. Daniel was given information about Medicaid Managed Care Coordination team services today. [place] Patient agreed to services and verbal consent obtained. ? ?Engaged with patient  for by telephone forfollow up visit in response to referral for case management and/or care coordination services.  ? ?Assessments/Interventions:  Review of past medical history, allergies, medications, health status, including review of consultants reports, laboratory and other test data, was performed as part of comprehensive evaluation and provision of chronic care management services. ? ?SDOH: (Social Determinant of Health) assessments and interventions performed: Mental Health needs ongoing. [facility] declined referral. ? ? ?Advanced Directives Status:  See Care Plan for related entries. ? ?Care Plan ?                ?Allergies  ?Allergen Reactions  ? Buspar [Buspirone]   ?  Dystonic reaction - arms, legs, jaw "locked up"  ? ? ?Medications Reviewed Today   ? ? Reviewed by [name], RN (Registered Nurse) on 10/05/21 at 1412  Med List Status: <None>  ? ?Medication Order Taking? Sig Documenting Provider Last Dose Status Informant  ?acetaminophen (TYLENOL) 500 MG tablet [id number] Yes Take 2 tablets (1,000 mg total) by mouth every 6 (six) hours as needed for mild pain. Olean Ree, MD Taking Active Multiple Informants  ?dicyclomine (BENTYL) 10 MG capsule [id number] Yes Take 2 capsules (20 mg total) by mouth 4 (four) times daily -  before meals and at bedtime. Steele Sizer, MD Taking Active   ?Eluxadoline (VIBERZI) 75 MG TABS [id number] No Take 75 mg by mouth 2 (two) times daily with a  meal.  ?Patient not taking: Reported on 10/05/2021  ? Steele Sizer, MD Not Taking Active   ?         ?Med Note (ROBB, MELANIE A   Fri Oct 05, 2021  2:11 PM) Has not picked up and started taking  ?ibuprofen (ADVIL) 400 MG tablet [id number] Yes Take 1.5 tablets (600 mg total) by mouth every 8 (eight) hours as needed for headache or mild pain. Fritzi Mandes, MD Taking Active   ?Multiple Vitamins-Minerals (WOMENS MULTIVITAMIN) TABS [id number] Yes Take 1 tablet by mouth daily. [provider] Taking Active Multiple Informants  ?OLANZapine (ZYPREXA) 2.5 MG tablet [id number] Yes Take 1 tablet (2.5 mg total) by mouth at bedtime. [name], [name], PA-C Taking Active   ?pantoprazole (PROTONIX) 40 MG tablet [id number] No Take 1 tablet (40 mg total) by mouth daily.  ?Patient not taking: Reported on 10/05/2021  ? Will Bonnet, MD Not Taking Active   ? ?  ?  ? ?  ? ? ?Patient Active Problem List  ? Diagnosis Date Noted  ? IBS (irritable bowel syndrome) 03/15/2021  ? 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 Bradycardia 02/26/2021  ? Right ovarian cyst 02/25/2021  ? Leukocytosis 02/25/2021  ? 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 S/P cholecystectomy   ? Status post cesarean delivery 11/14/2017  ? Dermoid cyst 10/03/2017  ? Dermoid cyst of left ovary 10/01/2017  ? Anxiety 06/24/2016  ? ? ?Conditions to be addressed/monitored per PCP order:  Anxiety ? ?Care Plan : LCSW plan of care  ?Updates made  by Greg Cutter, LCSW since 10/19/2021 12:00 AM  ?  ? ?Problem: Anxiety Identification (Anxiety)   ?  ? ?Long-Range Goal: Anxiety Symptoms Identified   ?Start Date: 10/11/2021  ?Priority: High  ?Note:   ?Priority: High ? ?Timeframe:  Long-Range Goal ?Priority:  High ?Start Date:   10/11/21                ?Expected End Date:  ongoing                   ?  ?Follow Up Date--11/01/21 ? ?- check out counseling ?- keep 90 percent of counseling appointments ?- schedule counseling appointment  ?  ?Why is this important?   ?           Beating depression may take some time.  ?          If you don't feel better right away, don't give up on your treatment plan.  ?  ?Current barriers:   ?          Chronic Mental Health needs related to stress management, anxiety, eating disorder and ADHD   ?          Needs Support, Education, and Care Coordination in order to meet unmet mental health needs. ? ?Clinical Goal(s): demonstrate a reduction in symptoms related to :  connecting with provider for ongoing mental health treatment, increase coping skills, combating negativity with positive thoughts, work on creating structure and healthy habits, improve self-management skills and stress reduction     ? ?Clinical Interventions:  ?          Assessed patient's previous and current treatment, coping skills, support system and barriers to care  ??         Depression screen reviewed, PHQ 2   ??         Solution-Focused Strategies ??         Mindfulness or Relaxation Training ??         Active listening / Reflection utilized  ??         Emotional Supportive Provided ??         Behavioral Activation ??         Participation in counseling encouraged  ??         Verbalization of feelings encouraged  ??         Crisis Resource Education / information provided  ??         Suicidal Ideation/Homicidal Ideation assessed: No SI/HI ??         CBT intervention implemented ??         Discussed referral for counseling and psychiatry. Patient she is been on Olanzapine for 6 months. She admits that she does have manic episodes. She shares that she has both general and social anxiety.  ?          Reviewed various resources and discussed options on available treatment options. Patient is agreeable to referral for counseling and psychiatry. [facility] LCSW made referral to [facility] on 10/11/21. [facility] LCSW educated patient on the power of thinking positive. UPDATE 10/19/21- [facility] declined patient's referral because they are unable to take new patients on for therapy and stated that patient needed  a higher level of care for ADHD and eating disorder. They stated that they cannot see patient until she gets tested done for ADHD. Patient was upset at this news which is understandable. [facility] LCSW suggested [facility] as a second option and  patient is willing to try this agency to get connected with long term mental health support (both psychiatry and counseling.) Patient will go to [facility] office to get new patient enrollment paperwork.  ??         Options for mental health treatment based on need and insurance ?          Inter-disciplinary care team collaboration (see longitudinal plan of care) ?          LCSW discussed coping skills for anxiety and depression. SW used empathetic and active and reflective listening, validated feelings/concerns, and provided emotional support. LCSW provided self-care education to help manage her mental health conditions and improve her mood.  ?          Patient denies any current crises or urgent needs ? ?Patient Goals/Self-Care Activities: Over the next 120 days ?          Call your insurance provider for more information about your Enhanced Benefits  ?   ? ?SDOH Screenings  ? ?Alcohol Screen: Low Risk   ? Last Alcohol Screening Score (AUDIT): 0  ?Depression (PHQ2-9): Low Risk   ? PHQ-2 Score: 1  ?Financial Resource Strain: Not on file  ?Food Insecurity: No Food Insecurity  ? Worried About Charity fundraiser in the Last Year: Never true  ? Ran Out of Food in the Last Year: Never true  ?Housing: Low Risk   ? Last Housing Risk Score: 0  ?Physical Activity: Not on file  ?Social Connections: Not on file  ?Stress: Not on file  ?Tobacco Use: Low Risk   ? Smoking Tobacco Use: Never  ? Smokeless Tobacco Use: Never  ? Passive Exposure: Not on file  ?Transportation Needs: No Transportation Needs  ? Lack of Transportation (Medical): No  ? Lack of Transportation (Non-Medical): No  ? ? ?  10/11/2021  ?  3:06 PM 08/20/2021  ?  8:21 AM 05/30/2021  ? 10:05 AM 03/15/2021  ?  1:52 PM 01/03/2021  ?  3:40 PM   ?Depression screen PHQ 2/9  ?Decreased Interest 0 0 2 1 0  ?Down, Depressed, Hopeless 1 0 1 2 0  ?PHQ - 2 Score 1 0 3 3 0  ?Altered sleeping   0 0 0  ?Tired, decreased energy   3 0 0  ?Change in appetite   3 0

## 2021-10-19 NOTE — Patient Outreach (Signed)
Medicaid Managed Care Social Work Note  10/19/2021 Name:  Tiffany Guzman MRN:  [id number] DOB:  November 06, 1995  Tiffany Guzman is an 26 y.o. year old female who is a primary patient of [name], [place].  The Medicaid Managed Care Coordination team was consulted for assistance with:  Mental Health Counseling and Resources  [name] was given information about Medicaid Managed Care Coordination team services today. Tiffany Guzman Patient agreed to services and verbal consent obtained.  Engaged with patient  for by telephone forfollow up visit in response to referral for case management and/or care coordination services.   Assessments/Interventions:  Review of past medical history, allergies, medications, health status, including review of consultants reports, laboratory and other test data, was performed as part of comprehensive evaluation and provision of chronic care management services.  SDOH: (Social Determinant of Health) assessments and interventions performed:   Advanced Directives Status:  Not addressed in this encounter.  Care Plan                 Allergies  Allergen Reactions   Buspar [Buspirone]     Dystonic reaction - arms, legs, jaw "locked up"    Medications Reviewed Today     Reviewed by Heidi Dach, RN (Registered Nurse) on 10/05/21 at 1412  Med List Status: <None>   Medication Order Taking? Sig Documenting Provider Last Dose Status Informant  acetaminophen (TYLENOL) 500 MG tablet [id number] Yes Take 2 tablets (1,000 mg total) by mouth every 6 (six) hours as needed for mild pain. [name], MD Taking Active Multiple Informants  dicyclomine (BENTYL) 10 MG capsule [id number] Yes Take 2 capsules (20 mg total) by mouth 4 (four) times daily -  before meals and at bedtime. [name], MD Taking Active   Eluxadoline (VIBERZI) 75 MG TABS [id number] No Take 75 mg by mouth 2 (two) times daily with a meal.  Patient not taking: Reported on 10/05/2021    [name], MD Not Taking Active            Med Note (ROBB, MELANIE A   Fri Oct 05, 2021  2:11 PM) Has not picked up and started taking  ibuprofen (ADVIL) 400 MG tablet [id number] Yes Take 1.5 tablets (600 mg total) by mouth every 8 (eight) hours as needed for headache or mild pain. Enedina Finner, MD Taking Active   Multiple Vitamins-Minerals (WOMENS MULTIVITAMIN) TABS [id number] Yes Take 1 tablet by mouth daily. [provider] Taking Active Multiple Informants  OLANZapine (ZYPREXA) 2.5 MG tablet [id number] Yes Take 1 tablet (2.5 mg total) by mouth at bedtime. [name], [name], PA-C Taking Active   pantoprazole (PROTONIX) 40 MG tablet [id number] No Take 1 tablet (40 mg total) by mouth daily.  Patient not taking: Reported on 10/05/2021   [name], MD Not Taking Active             Patient Active Problem List   Diagnosis Date Noted   IBS (irritable bowel syndrome) 03/15/2021   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   Bradycardia 02/26/2021   Right ovarian cyst 02/25/2021   Leukocytosis 02/25/2021   Status post ovarian cystectomy 02/25/2021   S/P cholecystectomy    Status post cesarean delivery 11/14/2017   Dermoid cyst 10/03/2017   Dermoid cyst of left ovary 10/01/2017   Anxiety 06/24/2016    Conditions to be addressed/monitored per PCP order:  Anxiety  Care Plan : LCSW plan of care  Updates made by [name], LCSW since 10/19/2021 12:00  AM     Problem: Anxiety Identification (Anxiety)      Long-Range Goal: Anxiety Symptoms Identified   Start Date: 10/11/2021  Priority: High  Note:   Priority: High  Timeframe:  Long-Range Goal Priority:  High Start Date:   10/11/21                Expected End Date:  ongoing                     Follow Up Date--11/01/21  - check out counseling - keep 90 percent of counseling appointments - schedule counseling appointment    Why is this important?             Beating depression may take some time.            If you  don't feel better right away, don't give up on your treatment plan.    Current barriers:             Chronic Mental Health needs related to stress management, anxiety, eating disorder and ADHD             Needs Support, Education, and Care Coordination in order to meet unmet mental health needs.  Clinical Goal(s): demonstrate a reduction in symptoms related to :  connecting with provider for ongoing mental health treatment, increase coping skills, combating negativity with positive thoughts, work on creating structure and healthy habits, improve self-management skills and stress reduction      Clinical Interventions:            Assessed patient's previous and current treatment, coping skills, support system and barriers to care  ?         Depression screen reviewed, PHQ 2   ?         Solution-Focused Strategies ?         Mindfulness or Relaxation Training ?         Active listening / Reflection utilized  ?         Emotional Supportive Provided ?         Behavioral Activation ?         Participation in counseling encouraged  ?         Verbalization of feelings encouraged  ?         Crisis Resource Education / information provided  ?         Suicidal Ideation/Homicidal Ideation assessed: No SI/HI ?         CBT intervention implemented ?         Discussed referral for counseling and psychiatry. Patient she is been on Olanzapine for 6 months. She admits that she does have manic episodes. She shares that she has both general and social anxiety.            Reviewed various resources and discussed options on available treatment options. Patient is agreeable to referral for counseling and psychiatry. [facility] LCSW made referral to [facility] on 10/11/21. [facility] LCSW educated patient on the power of thinking positive. UPDATE 10/19/21- [facility] declined patient's referral because they are unable to take new patients on for therapy and stated that patient needed a higher level of care for ADHD and eating disorder. They  stated that they cannot see patient until she gets tested done for ADHD. Patient was upset at this news which is understandable. [facility] LCSW suggested [facility] as a second option and patient is willing to try this agency to  get connected with long term mental health support (both psychiatry and counseling.) Patient will go to [facility] office to get new patient enrollment paperwork.  ?         Options for mental health treatment based on need and insurance           Inter-disciplinary care team collaboration (see longitudinal plan of care)           LCSW discussed coping skills for anxiety and depression. SW used empathetic and active and reflective listening, validated feelings/concerns, and provided emotional support. LCSW provided self-care education to help manage her mental health conditions and improve her mood.            Patient denies any current crises or urgent needs  Patient Goals/Self-Care Activities: Over the next 120 days           Call your insurance provider for more information about your Enhanced Benefits      SDOH Screenings   Alcohol Screen: Low Risk    Last Alcohol Screening Score (AUDIT): 0  Depression (PHQ2-9): Low Risk    PHQ-2 Score: 1  Financial Resource Strain: Not on file  Food Insecurity: No Food Insecurity   Worried About Programme researcher, broadcasting/film/video in the Last Year: Never true   Ran Out of Food in the Last Year: Never true  Housing: Low Risk    Last Housing Risk Score: 0  Physical Activity: Not on file  Social Connections: Not on file  Stress: Not on file  Tobacco Use: Low Risk    Smoking Tobacco Use: Never   Smokeless Tobacco Use: Never   Passive Exposure: Not on file  Transportation Needs: No Transportation Needs   Lack of Transportation (Medical): No   Lack of Transportation (Non-Medical): No      10/11/2021    3:06 PM 08/20/2021    8:21 AM 05/30/2021   10:05 AM 03/15/2021    1:52 PM 01/03/2021    3:40 PM  Depression screen PHQ 2/9  Decreased Interest 0 0 2 1 0   Down, Depressed, Hopeless 1 0 1 2 0  PHQ - 2 Score 1 0 3 3 0  Altered sleeping   0 0 0  Tired, decreased energy   3 0 0  Change in appetite   3 0 0  Feeling bad or failure about yourself    0 0 0  Trouble concentrating   0 0 0  Moving slowly or fidgety/restless   0 0 0  Suicidal thoughts   0 0 0  PHQ-9 Score   9 3 0  Difficult doing work/chores   Somewhat difficult Not difficult at all Not difficult at all   10 LITTLE Things To Do When You're Feeling Too Down To Do Anything  Take a shower. Even if you plan to stay in all day long and not see a soul, take a shower. It takes the most effort to hop in to the shower but once you do, you'll feel immediate results. It will wake you up and you'll be feeling much fresher (and cleaner too).  Brush and floss your teeth. Give your teeth a good brushing with a floss finish. It's a small task but it feels so good and you can check 'taking care of your health' off the list of things to do.  Do something small on your list. Most of [place] have some small thing we would like to get done (load of laundry, sew a button, email a  friend). Doing one of these things will make you feel like you've accomplished something.  Drink water. Drinking water is easy right? It's also really beneficial for your health so keep a glass beside you all day and take sips often. It gives you energy and prevents you from boredom eating.  Do some floor exercises. The last thing you want to do is exercise but it might be just the thing you need the most. Keep it simple and do exercises that involve sitting or laying on the floor. Even the smallest of exercises release chemicals in the brain that make you feel good. Yoga stretches or core exercises are going to make you feel good with minimal effort.  Make your bed. Making your bed takes a few minutes but it's productive and you'll feel relieved when it's done. An unmade bed is a huge visual reminder that you're having an  unproductive day. Do it and consider it your housework for the day.  Put on some nice clothes. Take the sweatpants off even if you don't plan to go anywhere. Put on clothes that make you feel good. Take a look in the mirror so your brain recognizes the sweatpants have been replaced with clothes that make you look great. It's an instant confidence booster.  Wash the dishes. A pile of dirty dishes in the sink is a reflection of your mood. It's possible that if you wash up the dishes, your mood will follow suit. It's worth a try.  Cook a real meal. If you have the luxury to have a "do nothing" day, you have time to make a real meal for yourself. Make a meal that you love to eat. The process is good to get you out of the funk and the food will ensure you have more energy for tomorrow.  Write out your thoughts by hand. When you hand write, you stimulate your brain to focus on the moment that you're in so make yourself comfortable and write whatever comes into your mind. Put those thoughts out on paper so they stop spinning around in your head. Those thoughts might be the very thing holding you down.  Follow up Goal     Follow up:  Patient agrees to Care Plan and Follow-up.  Plan: The Managed Medicaid care management team will reach out to the patient again over the next 30 days.  Date/time of next scheduled Social Work care management/care coordination outreach:  11/01/21 at 2:45 pm  Dickie La, BSW, MSW, [employer] Managed Medicaid LCSW [facility]  [email or web address] Phone: [phone]

## 2021-10-19 NOTE — Patient Instructions (Signed)
Visit Information ? ?Ms. Binegar was given information about Medicaid Managed Care team care coordination services as a part of their Healthy [employer] Medicaid benefit. Jeanell Lon Senegal verbally consented to engagement with the [facility] Managed Care team.  ? ?If you are experiencing a medical emergency, please call 911 or report to your local emergency department or urgent care.  ? ?If you have a non-emergency medical problem during routine business hours, please contact your provider's office and ask to speak with a nurse.  ? ?For questions related to your Healthy [facility] health plan, please call: [phone] or visit the homepage here: [email or web address] ? ?If you would like to schedule transportation through your Healthy [facility] plan, please call the following number at least 2 days in advance of your appointment: [phone] ? For information about your ride after you set it up, call Ride Assist at [phone]. Use this number to activate a Will Call pickup, or if your transportation is late for a scheduled pickup. Use this number, too, if you need to make a change or cancel a previously scheduled reservation. ? If you need transportation services right away, call [phone]. The after-hours call center is staffed 24 hours to handle ride assistance and urgent reservation requests (including discharges) 365 days a year. Urgent trips include sick visits, hospital discharge requests and life-sustaining treatment. ? ?Call the [place] at [phone], at any time, 24 hours a day, 7 days a week. If you are in danger or need immediate medical attention call 911. ? ?If you would like help to quit smoking, call 1-800-QUIT-NOW 7010916882) OR Espa?ol: 1-855-D?jelo-Ya 612-267-7983) o para m?s informaci?n haga clic aqu? or Text READY to 200-400 to register via text ? ?Following is a copy of your plan of care:  ?Care Plan : LCSW plan of care   ?Updates made by Greg Cutter, LCSW since 10/19/2021 12:00 AM  ?  ? ?Problem: Anxiety Identification (Anxiety)   ?  ? ?Long-Range Goal: Anxiety Symptoms Identified   ?Start Date: 10/11/2021  ?Priority: High  ?Note:   ?Priority: High ? ?Timeframe:  Long-Range Goal ?Priority:  High ?Start Date:   10/11/21                ?Expected End Date:  ongoing                   ?  ?Follow Up Date--11/01/21 ? ?- check out counseling ?- keep 90 percent of counseling appointments ?- schedule counseling appointment  ?  ?Why is this important?   ?          Beating depression may take some time.  ?          If you don't feel better right away, don't give up on your treatment plan.  ?  ?Current barriers:   ?          Chronic Mental Health needs related to stress management, anxiety, eating disorder and ADHD   ?          Needs Support, Education, and Care Coordination in order to meet unmet mental health needs. ? ?Clinical Goal(s): demonstrate a reduction in symptoms related to :  connecting with provider for ongoing mental health treatment, increase coping skills, combating negativity with positive thoughts, work on creating structure and healthy habits, improve self-management skills and stress reduction     ? ?Eula Fried, BSW, MSW, LCSW ?Managed Medicaid LCSW ?[facility] ?[email or web address] ?Phone: [phone] ? ?  ? ?  ?

## 2021-10-19 NOTE — Patient Instructions (Addendum)
Licensed Clinical Social Worker Visit Information ? ?Visit Information ? ?Tiffany Guzman was given information about Medicaid Managed Care team care coordination services as a part of their Healthy [facility] Medicaid benefit. Tiffany Guzman verbally consented to engagement with the [facility] Managed Care team.  ? ?If you are experiencing a medical emergency, please call 911 or report to your local emergency department or urgent care.  ? ?If you have a non-emergency medical problem during routine business hours, please contact your provider's office and ask to speak with a nurse.  ? ?For questions related to your Healthy [facility] health plan, please call: [phone] or visit the homepage here: [email or web address] ? ?If you would like to schedule transportation through your Healthy [facility] plan, please call the following number at least 2 days in advance of your appointment: [phone] ? For information about your ride after you set it up, call Ride Assist at [phone]. Use this number to activate a Will Call pickup, or if your transportation is late for a scheduled pickup. Use this number, too, if you need to make a change or cancel a previously scheduled reservation. ? If you need transportation services right away, call [phone]. The after-hours call center is staffed 24 hours to handle ride assistance and urgent reservation requests (including discharges) 365 days a year. Urgent trips include sick visits, hospital discharge requests and life-sustaining treatment. ? ?Call the [place] at [phone], at any time, 24 hours a day, 7 days a week. If you are in danger or need immediate medical attention call 911. ? ?If you would like help to quit smoking, call 1-800-QUIT-NOW 7158456683) OR Espa?ol: 1-855-D?jelo-Ya (256)426-2017) o para m?s informaci?n haga clic aqu? or Text READY to 200-400 to register via text ? ?Follow up on 11/01/21 at  2:45 pm ? ?Materials Provided: Verbal education about [facility] provided by phone ? ?Patient verbalizes understanding of instructions and care plan provided today and agrees to view in [place]. Active MyChart status confirmed with patient.   ? ?Follow Up Plan: SW will follow up with patient by phone over the next 3 weeks ? ?Tiffany Guzman, Tiffany Guzman, Tiffany Guzman, Tiffany Guzman ?Managed Medicaid Tiffany Guzman ?[facility] ?[email or web address] ?Phone: [phone] ? ? ?

## 2021-10-22 ENCOUNTER — Ambulatory Visit (INDEPENDENT_AMBULATORY_CARE_PROVIDER_SITE_OTHER): Payer: Medicaid Other | Admitting: Obstetrics and Gynecology

## 2021-10-22 VITALS — BP 120/78 | Wt 122.0 lb

## 2021-10-22 DIAGNOSIS — N83201 Unspecified ovarian cyst, right side: Secondary | ICD-10-CM

## 2021-10-22 NOTE — Progress Notes (Signed)
? ?Patient ID: Tiffany Guzman, female   DOB: [birth year], 26 y.o.   MRN: [id number] ? ?Reason for Consult: Follow-up ?  ?Referred by [name], PA-C ? ?Subjective:  ?   ?HPI: ? ?Tiffany Guzman is a 26 y.o. female.  She is following up today after pelvic ultrasound.  She has had a history of recurrent dermoid cyst.  She had her left ovary removed because an enlarged 12 cm dermoid cyst.  She also had a laparoscopic right ovarian cystectomy in 2020 for a dermoid cyst. ? ?Gynecological History ? ?Patient's last menstrual period was 10/15/2021. ? ?Past Medical History:  ?Diagnosis Date  ? Acne   ? Anxiety   ? Attention deficit hyperactivity disorder (ADHD)   ? Chronic upper back pain   ? lower and upper back pain  ? Depression   ? Dyspnea   ? GERD (gastroesophageal reflux disease)   ? Gestational hypertension 11/14/2017  ? Migraine with aura   ? since elementary school  ? Postoperative nausea and vomiting 02/27/2021  ? ?Family History  ?Problem Relation Age of Onset  ? Hypertension Mother   ? Hypothyroidism Mother   ? Depression Mother   ? Diabetes Maternal Grandmother   ? ?Past Surgical History:  ?Procedure Laterality Date  ? CESAREAN SECTION N/A 11/14/2017  ? Procedure: CESAREAN SECTION;  Surgeon: Will Bonnet, MD;  Location: [facility] ORS;  Service: Obstetrics;  Laterality: N/A;  ? ESOPHAGOGASTRODUODENOSCOPY Left 02/17/2019  ? Procedure: ESOPHAGOGASTRODUODENOSCOPY (EGD);  Surgeon: Virgel Manifold, MD;  Location[facility] ENDOSCOPY;  Service: Endoscopy;  Laterality: Left;  ? INSERTION OF NON VAGINAL CONTRACEPTIVE DEVICE    ? LAPAROSCOPIC OVARIAN CYSTECTOMY Right 02/23/2019  ? Procedure: LAPAROSCOPIC OVARIAN CYSTECTOMY;  Surgeon: Homero Fellers, MD;  Location: [facility] ORS;  Service: Gynecology;  Laterality: Right;  ? LAPAROSCOPY N/A 02/25/2021  ? Procedure: LAPAROSCOPY DIAGNOSTIC;  Surgeon: Will Bonnet, MD;  Location: [facility] ORS;  Service: Gynecology;  Laterality: N/A;  ? OOPHORECTOMY Left 10/03/2017   ? Procedure: OOPHORECTOMY;  Surgeon: Homero Fellers, MD;  Location: [facility] ORS;  Service: Gynecology;  Laterality: Left;  ? OVARIAN CYST REMOVAL Left 10/03/2017  ? Procedure: OVARIAN CYSTECTOMY;  Surgeon: Homero Fellers, MD;  Location: [facility] ORS;  Service: Gynecology;  Laterality: Left;  ? OVARIAN CYST REMOVAL Right 02/25/2021  ? Procedure: OVARIAN CYSTECTOMY;  Surgeon: Will Bonnet, MD;  Location: [facility] ORS;  Service: Gynecology;  Laterality: Right;  ? ? ?Short Social History:  ?Social History  ? ?Tobacco Use  ? Smoking status: Never  ? Smokeless tobacco: Never  ?Substance Use Topics  ? Alcohol use: No  ?  Alcohol/week: 0.0 standard drinks  ? ? ?Allergies  ?Allergen Reactions  ? Buspar [Buspirone]   ?  Dystonic reaction - arms, legs, jaw "locked up"  ? ? ?Current Outpatient Medications  ?Medication Sig Dispense Refill  ? acetaminophen (TYLENOL) 500 MG tablet Take 2 tablets (1,000 mg total) by mouth every 6 (six) hours as needed for mild pain.    ? dicyclomine (BENTYL) 10 MG capsule Take 2 capsules (20 mg total) by mouth 4 (four) times daily -  before meals and at bedtime. 240 capsule 0  ? Eluxadoline (VIBERZI) 75 MG TABS Take 75 mg by mouth 2 (two) times daily with a meal. 60 tablet 0  ? ibuprofen (ADVIL) 400 MG tablet Take 1.5 tablets (600 mg total) by mouth every 8 (eight) hours as needed for headache or mild pain. 10 tablet 0  ? Multiple  Vitamins-Minerals (WOMENS MULTIVITAMIN) TABS Take 1 tablet by mouth daily.    ? OLANZapine (ZYPREXA) 2.5 MG tablet Take 1 tablet (2.5 mg total) by mouth at bedtime. 90 tablet 1  ? pantoprazole (PROTONIX) 40 MG tablet Take 1 tablet (40 mg total) by mouth daily. 30 tablet 1  ? ?No current facility-administered medications for this visit.  ? ? ?Review of Systems  ?Constitutional: Negative for chills, fatigue, fever and unexpected weight change.  ?HENT: Negative for trouble swallowing.  ?Eyes: Negative for loss of vision.  ?Respiratory: Negative for cough, shortness  of breath and wheezing.  ?Cardiovascular: Negative for chest pain, leg swelling, palpitations and syncope.  ?GI: Negative for abdominal pain, blood in stool, diarrhea, nausea and vomiting.  ?GU: Negative for difficulty urinating, dysuria, frequency and hematuria.  ?Musculoskeletal: Negative for back pain, leg pain and joint pain.  ?Skin: Negative for rash.  ?Neurological: Negative for dizziness, headaches, light-headedness, numbness and seizures.  ?Psychiatric: Negative for behavioral problem, confusion, depressed mood and sleep disturbance.   ? ?   ?Objective:  ?Objective  ? ?Vitals:  ? 10/22/21 1531  ?BP: 120/78  ?Weight: 122 lb (55.3 kg)  ? ?Body mass index is 20.94 kg/m?. ? ?Physical Exam ?Vitals and nursing note reviewed. Exam conducted with a chaperone present.  ?Constitutional:   ?   Appearance: Normal appearance.  ?HENT:  ?   Head: Normocephalic and atraumatic.  ?Eyes:  ?   Extraocular Movements: Extraocular movements intact.  ?   Pupils: Pupils are equal, round, and reactive to light.  ?Cardiovascular:  ?   Rate and Rhythm: Normal rate and regular rhythm.  ?Pulmonary:  ?   Effort: Pulmonary effort is normal.  ?   Breath sounds: Normal breath sounds.  ?Abdominal:  ?   General: Abdomen is flat.  ?   Palpations: Abdomen is soft.  ?Musculoskeletal:  ?   Cervical back: Normal range of motion.  ?Skin: ?   General: Skin is warm and dry.  ?Neurological:  ?   General: No focal deficit present.  ?   Mental Status: She is alert and oriented to person, place, and time.  ?Psychiatric:     ?   Behavior: Behavior normal.     ?   Thought Content: Thought content normal.     ?   Judgment: Judgment normal.  ? ? ?Assessment/Plan:  ?  ? ?26 year old with right ovarian cyst again suspicious for dermoid. ?Recommended undergoing a laparoscopic ovarian cystectomy.  However given that my time the practice is limited I recommended her seeking the care of another GYN physician locally.  I recommended several different physicians.   She desires to follow-up with Dr. Glennon Mac from [facility].  She will call them to schedule her appointments. ? ?More than 20 minutes were spent face to face with the patient in the room, reviewing the medical record, labs and images, and coordinating care for the patient. The plan of management was discussed in detail and counseling was provided.  ?  ?Adrian Prows MD ?[place], [place] ?10/22/2021 ?4:55 PM ? ? ?

## 2021-10-27 ENCOUNTER — Emergency Department: Payer: Medicaid Other

## 2021-10-27 ENCOUNTER — Emergency Department
Admission: EM | Admit: 2021-10-27 | Discharge: 2021-10-27 | Disposition: A | Discharge status: Home / Self Care | Payer: Medicaid Other | Attending: Emergency Medicine | Admitting: Emergency Medicine

## 2021-10-27 DIAGNOSIS — I880 Nonspecific mesenteric lymphadenitis: Secondary | ICD-10-CM | POA: Diagnosis not present

## 2021-10-27 DIAGNOSIS — R000 Tachycardia, unspecified: Secondary | ICD-10-CM | POA: Insufficient documentation

## 2021-10-27 DIAGNOSIS — F419 Anxiety disorder, unspecified: Secondary | ICD-10-CM | POA: Insufficient documentation

## 2021-10-27 DIAGNOSIS — K529 Noninfective gastroenteritis and colitis, unspecified: Secondary | ICD-10-CM

## 2021-10-27 DIAGNOSIS — N83201 Unspecified ovarian cyst, right side: Secondary | ICD-10-CM | POA: Diagnosis not present

## 2021-10-27 DIAGNOSIS — R112 Nausea with vomiting, unspecified: Secondary | ICD-10-CM | POA: Diagnosis present

## 2021-10-27 DIAGNOSIS — K520 Gastroenteritis and colitis due to radiation: Secondary | ICD-10-CM | POA: Diagnosis not present

## 2021-10-27 LAB — CBC WITH DIFFERENTIAL/PLATELET
Abs Immature Granulocytes: 0.01 10*3/uL (ref 0.00–0.07)
Basophils Absolute: 0 10*3/uL (ref 0.0–0.1)
Basophils Relative: 1 %
Eosinophils Absolute: 0.1 10*3/uL (ref 0.0–0.5)
Eosinophils Relative: 2 %
HCT: 39.8 % (ref 36.0–46.0)
Hemoglobin: 12.6 g/dL (ref 12.0–15.0)
Immature Granulocytes: 0 %
Lymphocytes Relative: 26 %
Lymphs Abs: 1.8 10*3/uL (ref 0.7–4.0)
MCH: 27.2 pg (ref 26.0–34.0)
MCHC: 31.7 g/dL (ref 30.0–36.0)
MCV: 86 fL (ref 80.0–100.0)
Monocytes Absolute: 0.4 10*3/uL (ref 0.1–1.0)
Monocytes Relative: 7 %
Neutro Abs: 4.4 10*3/uL (ref 1.7–7.7)
Neutrophils Relative %: 64 %
Platelets: 290 10*3/uL (ref 150–400)
RBC: 4.63 MIL/uL (ref 3.87–5.11)
RDW: 13.1 % (ref 11.5–15.5)
WBC: 6.8 10*3/uL (ref 4.0–10.5)
nRBC: 0 % (ref 0.0–0.2)

## 2021-10-27 LAB — COMPREHENSIVE METABOLIC PANEL
ALT: 18 U/L (ref 0–44)
AST: 25 U/L (ref 15–41)
Albumin: 4.3 g/dL (ref 3.5–5.0)
Alkaline Phosphatase: 61 U/L (ref 38–126)
Anion gap: 6 (ref 5–15)
BUN: 20 mg/dL (ref 6–20)
CO2: 28 mmol/L (ref 22–32)
Calcium: 8.8 mg/dL — ABNORMAL LOW (ref 8.9–10.3)
Chloride: 104 mmol/L (ref 98–111)
Creatinine, Ser: 0.77 mg/dL (ref 0.44–1.00)
GFR, Estimated: >60 mL/min (ref >60)
Glucose, Bld: 91 mg/dL (ref 70–99)
Potassium: 3.6 mmol/L (ref 3.5–5.1)
Sodium: 138 mmol/L (ref 135–145)
Total Bilirubin: 0.6 mg/dL (ref 0.3–1.2)
Total Protein: 7.8 g/dL (ref 6.5–8.1)

## 2021-10-27 LAB — URINALYSIS, ROUTINE W REFLEX MICROSCOPIC
Bacteria, UA: NONE SEEN
Bilirubin Urine: NEGATIVE
Glucose, UA: NEGATIVE mg/dL
Hgb urine dipstick: NEGATIVE
Ketones, ur: NEGATIVE mg/dL
Nitrite: NEGATIVE
Protein, ur: NEGATIVE mg/dL
Specific Gravity, Urine: 1.01 (ref 1.005–1.030)
pH: 6 (ref 5.0–8.0)

## 2021-10-27 LAB — ETHANOL: Alcohol, Ethyl (B): <10 mg/dL (ref <10)

## 2021-10-27 LAB — LIPASE, BLOOD: Lipase: 34 U/L (ref 11–51)

## 2021-10-27 LAB — PREGNANCY, URINE: Preg Test, Ur: NEGATIVE

## 2021-10-27 LAB — MAGNESIUM: Magnesium: 1.9 mg/dL (ref 1.7–2.4)

## 2021-10-27 MED ORDER — ONDANSETRON 4 MG PO TBDP: 4.0000 mg | ORAL_TABLET | Freq: Three times a day (TID) | ORAL | 0 refills | Status: DC | PRN

## 2021-10-27 MED ORDER — AMOXICILLIN-POT CLAVULANATE 875-125 MG PO TABS: 1.0000 | ORAL_TABLET | Freq: Two times a day (BID) | ORAL | 0 refills | Status: DC

## 2021-10-27 MED ORDER — SODIUM CHLORIDE 0.9 % IV BOLUS
1000.0000 mL | Freq: Once | INTRAVENOUS | Status: AC
  Administered 2021-10-27: 1000 mL via INTRAVENOUS

## 2021-10-27 MED ORDER — ONDANSETRON HCL 4 MG/2ML IJ SOLN
4.0000 mg | Freq: Once | INTRAMUSCULAR | Status: AC
  Administered 2021-10-27: 4 mg via INTRAVENOUS
  Filled 2021-10-27: qty 2

## 2021-10-27 MED ORDER — ONDANSETRON HCL 4 MG/2ML IJ SOLN
4.0000 mg | Freq: Once | INTRAMUSCULAR | Status: DC
  Filled 2021-10-27: qty 2

## 2021-10-27 MED ORDER — PROCHLORPERAZINE EDISYLATE 10 MG/2ML IJ SOLN
10.0000 mg | Freq: Once | INTRAMUSCULAR | Status: AC
  Administered 2021-10-27: 10 mg via INTRAVENOUS
  Filled 2021-10-27: qty 2

## 2021-10-27 MED ORDER — IOHEXOL 300 MG/ML  SOLN
80.0000 mL | Freq: Once | INTRAMUSCULAR | Status: AC | PRN
  Administered 2021-10-27: 80 mL via INTRAVENOUS

## 2021-10-27 MED ORDER — LORAZEPAM 2 MG/ML IJ SOLN
1.0000 mg | Freq: Once | INTRAMUSCULAR | Status: AC
Start: 2021-10-27 — End: 2021-10-27
  Administered 2021-10-27: 1 mg via INTRAVENOUS
  Filled 2021-10-27: qty 1

## 2021-10-27 NOTE — ED Notes (Signed)
Pt transported to CT ?

## 2021-10-27 NOTE — ED Notes (Signed)
RN at bedside. Pt actively vomiting. MD made aware  ?

## 2021-10-27 NOTE — ED Provider Notes (Addendum)
? ?[facility] ?Provider Note ? ? ? Event Date/Time  ? First MD Initiated Contact with Patient 10/27/21 [phone]   ?  (approximate) ? ? ?History  ? ?Abdominal Pain ? ? ?HPI ? ?Tiffany Guzman is a 26 y.o. female who reports she had about 3 beers yesterday.  She woke up this morning with nausea vomiting diarrhea and cramping all over.  She says alcohol does not agree with her very well.  She is not running a fever.  She is slightly tachycardic. ? ?  ? ? ?Physical Exam  ? ?Triage Vital Signs: ?ED Triage Vitals  ?Enc Vitals Group  ?   BP 10/27/21 0730 112/75  ?   Pulse Rate 10/27/21 0730 (!) 115  ?   Resp 10/27/21 0732 17  ?   Temp 10/27/21 0732 97.7 ?F (36.5 ?C)  ?   Temp Source 10/27/21 0732 Oral  ?   SpO2 10/27/21 0730 100 %  ?   Weight 10/27/21 0726 120 lb (54.4 kg)  ?   Height 10/27/21 0726 '5\' 4"'$  (1.626 m)  ?   Head Circumference --   ?   Peak Flow --   ?   Pain Score 10/27/21 0726 5  ?   Pain Loc --   ?   Pain Edu? --   ?   Excl. in [place]? --   ? ? ?Most recent vital signs: ?Vitals:  ? 10/27/21 1000 10/27/21 1130  ?BP: (!) 118/106 120/86  ?Pulse: (!) 102 (!) 105  ?Resp:  18  ?Temp:  97.9 ?F (36.6 ?C)  ?SpO2: 100% 99%  ? ? ? ?General: Awake, no distress.  ?CV:  Good peripheral perfusion.  Heart regular rate and rhythm no audible murmur she is slightly tachycardic ?Resp:  Normal effort.  ?Abd:  No distention.  Soft bowel sounds positive no tenderness to palpation or percussion patient does complain of some mild mid abdominal pain. ?Extremities: No edema or erythema ? ? ?ED Results / Procedures / Treatments  ? ?Labs ?(all labs ordered are listed, but only abnormal results are displayed) ?Labs Reviewed  ?URINALYSIS, ROUTINE W REFLEX MICROSCOPIC - Abnormal; Notable for the following components:  ?    Result Value  ? Color, Urine STRAW (*)   ? APPearance CLEAR (*)   ? Leukocytes,Ua TRACE (*)   ? All other components within normal limits  ?COMPREHENSIVE METABOLIC PANEL - Abnormal; Notable for the  following components:  ? Calcium 8.8 (*)   ? All other components within normal limits  ?PREGNANCY, URINE  ?ETHANOL  ?LIPASE, BLOOD  ?CBC WITH DIFFERENTIAL/PLATELET  ?MAGNESIUM  ? ? ? ?EKG ? ? ? ? ?RADIOLOGY CT read by radiology reviewed by me that is the films reviewed by me showed some fluid in the distal small bowel with some mesenteric adenitis as well.  This is consistent with enteritis probably infectious.  There is also the ovarian cyst that patient reports having present.  I did review the films as I mention. ? ? ? ?PROCEDURES: ? ?Critical Care performed:  ? ?Procedures ? ? ?MEDICATIONS ORDERED IN ED: ?Medications  ?sodium chloride 0.9 % bolus 1,000 mL (0 mLs Intravenous Stopped 10/27/21 0902)  ?ondansetron [facility]) injection 4 mg (4 mg Intravenous Given 10/27/21 0752)  ?prochlorperazine (COMPAZINE) injection 10 mg (10 mg Intravenous Given 10/27/21 1003)  ?LORazepam (ATIVAN) injection 1 mg (1 mg Intravenous Given 10/27/21 1004)  ?iohexol (OMNIPAQUE) 300 MG/ML solution 80 mL (80 mLs Intravenous Contrast Given 10/27/21 1031)  ? ? ? ?  IMPRESSION / MDM / ASSESSMENT AND PLAN / ED COURSE  ?I reviewed the triage vital signs and the nursing notes. ?----------------------------------------- ?9:59 AM on 10/27/2021 ?----------------------------------------- ?Patient complains of worsening anxiety abdominal pain is not any better and now she is vomiting.  She has had the Zofran.  Her lab work is essentially normal.  She is not pregnant.  However she is looking worse.  I will give her some Compazine and some Ativan for her anxiety and will CT her belly.  We will see how she is doing ? ?After the CT report returns patient is wanting to go home.  She is feeling somewhat better.  We will give her some Augmentin to treat the apparent infectious enteritis and mesenteric adenitis.  She will return if she is worse or no better in a few days.  Worse including increasing or worse pain diarrhea or any fever or feeling sicker. ? ? ?   ? ? ?FINAL CLINICAL IMPRESSION(S) / ED DIAGNOSES  ? ?Final diagnoses:  ?Enteritis  ?Mesenteric adenitis  ? ? ? ?Rx / DC Orders  ? ?ED Discharge Orders   ? ?      Ordered  ?  amoxicillin-clavulanate (AUGMENTIN) 875-125 MG tablet  2 times daily       ? 10/27/21 1134  ?  ondansetron (ZOFRAN-ODT) 4 MG disintegrating tablet  Every 8 hours PRN       ? 10/27/21 1134  ? ?  ?  ? ?  ? ? ? ?Note:  This document was prepared using Dragon voice recognition software and may include unintentional dictation errors. ?  ?Nena Polio, MD ?10/27/21 1525 ? ?  ?Nena Polio, MD ?10/27/21 1525 ? ?

## 2021-10-27 NOTE — ED Triage Notes (Signed)
Pt c/o abd cramping with N/V/D when she woke up this morning. States she felt fine yesterday ?

## 2021-10-27 NOTE — ED Notes (Signed)
Pt back from CT and placed back on monitor ?

## 2021-10-27 NOTE — Discharge Instructions (Addendum)
It looks like you have an infection in your small bowel.  We will try some Augmentin antibiotic 1 pill twice a day with food to see if that helps.  I will give you some Zofran melt on your tongue wafers 1 pill up to 3 times a day as needed for nausea or vomiting.  Please return for increasing pain or fever or if you are sicker.  As long as you are improving you can follow-up with your doctor in about a week or 2 to make sure everything is okay. ?

## 2021-10-27 NOTE — ED Notes (Signed)
Dc instructions and scripts reviewed with pt no questions or concerns at this time. Will follow up with pcp in 1-2 weeks ?

## 2021-10-27 NOTE — ED Notes (Signed)
Pt assisted to the bathroom to provide urine sample  ?

## 2021-10-29 ENCOUNTER — Telehealth: Payer: Self-pay

## 2021-10-29 NOTE — Telephone Encounter (Signed)
Transition Care Management Follow-up Telephone Call ?Date of discharge and from where: 10/27/2021-[facility] ?How have you been since you were released from the hospital? Pt stated she is doing fine and will pick up her medications today.  ?Any questions or concerns? No ? ?Items Reviewed: ?Did the pt receive and understand the discharge instructions provided? Yes  ?Medications obtained and verified? Yes  ?Other? No  ?Any new allergies since your discharge? No  ?Dietary orders reviewed? No ?Do you have support at home? Yes  ? ?Home Care and Equipment/Supplies: ?Were home health services ordered? not applicable ?If so, what is the name of the agency? N/A  ?Has the agency set up a time to come to the patient's home? not applicable ?Were any new equipment or medical supplies ordered?  No ?What is the name of the medical supply agency? N/A ?Were you able to get the supplies/equipment? not applicable ?Do you have any questions related to the use of the equipment or supplies? No ? ?Functional Questionnaire: (I = Independent and D = Dependent) ?ADLs: I ? ?Bathing/Dressing- I ? ?Meal Prep- I ? ?Eating- I ? ?Maintaining continence- I ? ?Transferring/Ambulation- I ? ?Managing Meds- I ? ?Follow up appointments reviewed: ? ?PCP Hospital f/u appt confirmed? No   ?Specialist Hospital f/u appt confirmed? No   ?Are transportation arrangements needed? No  ?If their condition worsens, is the pt aware to call PCP or go to the Emergency Dept.? Yes ?Was the patient provided with contact information for the PCP's office or ED? Yes ?Was to pt encouraged to call back with questions or concerns? Yes  ?

## 2021-11-01 ENCOUNTER — Other Ambulatory Visit: Payer: Self-pay | Admitting: Licensed Clinical Social Worker

## 2021-11-01 NOTE — Patient Instructions (Addendum)
? Visit Information ? ?Ms. Dorsi was given information about Medicaid Managed Care team care coordination services as a part of their [employer] Medicaid benefit. Rosslyn Lon Gentz verbally consented to engagement with the [facility] Managed Care team.  ? ?If you are experiencing a medical emergency, please call 911 or report to your local emergency department or urgent care.  ? ?If you have a non-emergency medical problem during routine business hours, please contact your provider's office and ask to speak with a nurse.  ? ?For questions related to your Healthy [facility] health plan, please call: [phone] or visit the homepage here: [email or web address] ? ?If you would like to schedule transportation through your Healthy [facility] plan, please call the following number at least 2 days in advance of your appointment: [phone] ? For information about your ride after you set it up, call Ride Assist at [phone]. Use this number to activate a Will Call pickup, or if your transportation is late for a scheduled pickup. Use this number, too, if you need to make a change or cancel a previously scheduled reservation. ? If you need transportation services right away, call [phone]. The after-hours call center is staffed 24 hours to handle ride assistance and urgent reservation requests (including discharges) 365 days a year. Urgent trips include sick visits, hospital discharge requests and life-sustaining treatment. ? ?Call the [place] at [phone], at any time, 24 hours a day, 7 days a week. If you are in danger or need immediate medical attention call 911. ? ?If you would like help to quit smoking, call 1-800-QUIT-NOW 314-269-8200) OR Espa?ol: 1-855-D?jelo-Ya 316-180-2685) o para m?s informaci?n haga clic aqu? or Text READY to 200-400 to register via text ? ?Following is a copy of your plan of care:  ?Care Plan : LCSW plan of  care  ?Updates made by Greg Cutter, LCSW since 11/01/2021 12:00 AM  ?  ? ?Problem: Anxiety Identification (Anxiety)   ?  ? ?Long-Range Goal: Anxiety Symptoms Identified   ?Start Date: 10/11/2021  ?Priority: High  ?Note:   ?Priority: High ? ?Timeframe:  Long-Range Goal ?Priority:  High ?Start Date:   10/11/21                ?Expected End Date:  ongoing                   ?  ?Follow Up Date--01/31/22 ? ?- check out counseling ?- keep 90 percent of counseling appointments ?- schedule counseling appointment  ?  ?Why is this important?   ?          Beating depression may take some time.  ?          If you don't feel better right away, don't give up on your treatment plan.  ?  ?Current barriers:   ?          Chronic Mental Health needs related to stress management, anxiety, eating disorder and ADHD   ?          Needs Support, Education, and Care Coordination in order to meet unmet mental health needs. ? ?Clinical Goal(s): demonstrate a reduction in symptoms related to :  connecting with provider for ongoing mental health treatment, increase coping skills, combating negativity with positive thoughts, work on creating structure and healthy habits, improve self-management skills and stress reduction     ? ?10 LITTLE Things To Do When You?re Feeling Too Down To Do Anything ? ?Take a shower. ?Even  if you plan to stay in all day long and not see a soul, take a shower. It takes the most effort to hop in to the shower but once you do, you?ll feel immediate results. It will wake you up and you?ll be feeling much fresher (and cleaner too). ? ?Brush and floss your teeth. ?Give your teeth a good brushing with a floss finish. It?s a small task but it feels so good and you can check ?taking care of your health? off the list of things to do. ? ?Do something small on your list. ?Most of [place] have some small thing we would like to get done (load of laundry, sew a button, email a friend). Doing one of these things will make you feel like  you?ve accomplished something. ? ?Drink water. ?Drinking water is easy right? It?s also really beneficial for your health so keep a glass beside you all day and take sips often. It gives you energy and prevents you from boredom eating. ? ?Do some floor exercises. ?The last thing you want to do is exercise but it might be just the thing you need the most. Keep it simple and do exercises that involve sitting or laying on the floor. Even the smallest of exercises release chemicals in the brain that make you feel good. Yoga stretches or core exercises are going to make you feel good with minimal effort. ? ?Make your bed. ?Making your bed takes a few minutes but it?s productive and you?ll feel relieved when it?s done. An unmade bed is a huge visual reminder that you?re having an unproductive day. Do it and consider it your housework for the day. ? ?Put on some nice clothes. ?Take the sweatpants off even if you don?t plan to go anywhere. Put on clothes that make you feel good. Take a look in the mirror so your brain recognizes the sweatpants have been replaced with clothes that make you look great. It?s an instant confidence booster. ? ?Wash the dishes. ?A pile of dirty dishes in the sink is a reflection of your mood. It?s possible that if you wash up the dishes, your mood will follow suit. It?s worth a try. ? ?Lacinda Axon a real meal. ?If you have the luxury to have a ?do nothing? day, you have time to make a real meal for yourself. Make a meal that you love to eat. The process is good to get you out of the funk and the food will ensure you have more energy for tomorrow. ? ?Write out your thoughts by hand. ?When you hand write, you stimulate your brain to focus on the moment that you?re in so make yourself comfortable and write whatever comes into your mind. Put those thoughts out on paper so they stop spinning around in your head. Those thoughts might be the very thing holding you down. ? ?Follow up Goal ? ?Eula Fried, BSW,  MSW, LCSW ?Managed Medicaid LCSW ?[employer] ?[email or web address] ?Phone: [phone] ? ?  ? ?  ?

## 2021-11-01 NOTE — Patient Outreach (Signed)
Medicaid Managed Care Social Work Note  11/01/2021 Name:  Tiffany Guzman MRN:  [id number] DOB:  04/03/1996  Tiffany Guzman is an 26 y.o. year old female who is a primary patient of [name], [place].  The Medicaid Managed Care Coordination team was consulted for assistance with:  Mental Health Counseling and Resources  Ms. Mcgahan was given information about Medicaid Managed Care Coordination team services today. Tiffany Guzman Patient agreed to services and verbal consent obtained.  Engaged with patient  for by telephone forfollow up visit in response to referral for case management and/or care coordination services.   Assessments/Interventions:  Review of past medical history, allergies, medications, health status, including review of consultants reports, laboratory and other test data, was performed as part of comprehensive evaluation and provision of chronic care management services.  SDOH: (Social Determinant of Health) assessments and interventions performed: SDOH Interventions    Flowsheet Row Most Recent Value  SDOH Interventions   Stress Interventions Provide Counseling, [employer]       Advanced Directives Status:  Not addressed in this encounter.  Care Plan                 Allergies  Allergen Reactions   Buspar [Buspirone]     Dystonic reaction - arms, legs, jaw "locked up"    Medications Reviewed Today     Reviewed by Liliane Shi, CMA (Certified Medical Assistant) on 10/22/21 at 1532  Med List Status: <None>   Medication Order Taking? Sig Documenting Provider Last Dose Status Informant  acetaminophen (TYLENOL) 500 MG tablet [id number] Yes Take 2 tablets (1,000 mg total) by mouth every 6 (six) hours as needed for mild pain. [name], MD Taking Active Multiple Informants  dicyclomine (BENTYL) 10 MG capsule [id number] Yes Take 2 capsules (20 mg total) by mouth 4 (four) times daily -  before meals and at bedtime. [name], MD Taking Active   Eluxadoline (VIBERZI) 75 MG TABS [id number] Yes Take 75 mg by mouth 2 (two) times daily with a meal. [name], MD Taking Active            Med Note (ROBB, MELANIE A   Fri Oct 05, 2021  2:11 PM) Has not picked up and started taking  ibuprofen (ADVIL) 400 MG tablet [id number] Yes Take 1.5 tablets (600 mg total) by mouth every 8 (eight) hours as needed for headache or mild pain. Enedina Finner, MD Taking Active   Multiple Vitamins-Minerals (WOMENS MULTIVITAMIN) TABS [id number] Yes Take 1 tablet by mouth daily. [provider] Taking Active Multiple Informants  OLANZapine (ZYPREXA) 2.5 MG tablet [id number] Yes Take 1 tablet (2.5 mg total) by mouth at bedtime. [name], [name], PA-C Taking Active   pantoprazole (PROTONIX) 40 MG tablet [id number] Yes Take 1 tablet (40 mg total) by mouth daily. 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   Bradycardia 02/26/2021   Right ovarian cyst 02/25/2021   Leukocytosis 02/25/2021   Status post ovarian cystectomy 02/25/2021   S/P cholecystectomy    Status post cesarean delivery 11/14/2017   Dermoid cyst 10/03/2017   Dermoid cyst of left ovary 10/01/2017   Anxiety 06/24/2016    Conditions to be addressed/monitored per PCP order:  Anxiety and Depression  Care Plan : LCSW plan of care  Updates made by [name], LCSW since 11/01/2021 12:00 AM     Problem: Anxiety Identification (Anxiety)      Long-Range Goal: Anxiety Symptoms Identified   Start Date: 10/11/2021  Priority: High  Note:   Priority: High  Timeframe:  Long-Range Goal Priority:  High Start Date:   10/11/21                Expected End Date:  ongoing                     Follow Up Date--01/31/22  - check out counseling - keep 90 percent of counseling appointments - schedule counseling appointment    Why  is this important?             Beating depression may take some time.            If you don't feel better right away, don't give up on your treatment plan.    Current barriers:             Chronic Mental Health needs related to stress management, anxiety, eating disorder and ADHD             Needs Support, Education, and Care Coordination in order to meet unmet mental health needs.  Clinical Goal(s): demonstrate a reduction in symptoms related to :  connecting with provider for ongoing mental health treatment, increase coping skills, combating negativity with positive thoughts, work on creating structure and healthy habits, improve self-management skills and stress reduction      Clinical Interventions:            Assessed patient's previous and current treatment, coping skills, support system and barriers to care  ?         Depression screen reviewed, PHQ 2   ?         Solution-Focused Strategies ?         Mindfulness or Relaxation Training ?         Active listening / Reflection utilized  ?         Emotional Supportive Provided ?         Behavioral Activation ?         Participation in counseling encouraged  ?         Verbalization of feelings encouraged  ?         Crisis Resource Education / information provided  ?         Suicidal Ideation/Homicidal Ideation assessed: No SI/HI ?         CBT intervention implemented ?         Discussed referral for counseling and psychiatry. Patient she is been on Olanzapine for 6 months. She admits that she does have manic episodes. She shares that she has both general and social anxiety.            Reviewed various resources and discussed options on available treatment options. Patient is agreeable to referral for counseling and psychiatry. [facility] LCSW made referral to [facility] on 10/11/21. [facility] LCSW educated patient on the power of thinking positive. UPDATE 10/19/21- [facility] declined patient's referral because they are unable to take new patients on for  therapy and stated that patient needed a higher level of care for ADHD and eating disorder. They stated that they cannot see patient until she gets tested done for ADHD. Patient was upset at this news which is understandable. [facility] LCSW suggested [facility] as a second  option and patient is willing to try this agency to get connected with long term mental health support (both psychiatry and counseling.) Patient will go to [facility] office to get new patient enrollment paperwork. Update 11/01/21- Patient has not been to [facility] yet. Patient is waiting to pursue mental health treatment. She states that she has a lot going on physically right now and wishes to focus on her mental health at a different time. Patient is agreeable a follow up call in 90 days to reassess her mental health needs. Patient reports that she has upcoming surgery to remove another ovary cyst. She has already had issues with ovary cyst in the past. Patient was educated again on available mental health resources within her area and was encouraged to contact [facility] LCSW if she has any social work needs that arise that need to be addressed before our scheduled follow up appointment on 01/31/22. Patient agreeable to this plan.  ?         Options for mental health treatment based on need and insurance           Inter-disciplinary care team collaboration (see longitudinal plan of care)           LCSW discussed coping skills for anxiety and depression. SW used empathetic and active and reflective listening, validated feelings/concerns, and provided emotional support. LCSW provided self-care education to help manage her mental health conditions and improve her mood.            Patient denies any current crises or urgent needs  Patient Goals/Self-Care Activities: Over the next 120 days           Call your insurance provider for more information about your Enhanced Benefits      SDOH Screenings   Alcohol Screen: Low Risk    Last Alcohol Screening Score (AUDIT): 0   Depression (PHQ2-9): Low Risk    PHQ-2 Score: 1  Financial Resource Strain: Not on file  Food Insecurity: No Food Insecurity   Worried About Programme researcher, broadcasting/film/video in the Last Year: Never true   Ran Out of Food in the Last Year: Never true  Housing: Low Risk    Last Housing Risk Score: 0  Physical Activity: Not on file  Social Connections: Not on file  Stress: Not on file  Tobacco Use: Low Risk    Smoking Tobacco Use: Never   Smokeless Tobacco Use: Never   Passive Exposure: Not on file  Transportation Needs: No Transportation Needs   Lack of Transportation (Medical): No   Lack of Transportation (Non-Medical): No      10/11/2021    3:06 PM 08/20/2021    8:21 AM 05/30/2021   10:05 AM 03/15/2021    1:52 PM 01/03/2021    3:40 PM  Depression screen PHQ 2/9  Decreased Interest 0 0 2 1 0  Down, Depressed, Hopeless 1 0 1 2 0  PHQ - 2 Score 1 0 3 3 0  Altered sleeping   0 0 0  Tired, decreased energy   3 0 0  Change in appetite   3 0 0  Feeling bad or failure about yourself    0 0 0  Trouble concentrating   0 0 0  Moving slowly or fidgety/restless   0 0 0  Suicidal thoughts   0 0 0  PHQ-9 Score   9 3 0  Difficult doing work/chores   Somewhat difficult Not difficult at all Not difficult at all   10  LITTLE Things To Do When You're Feeling Too Down To Do Anything  Take a shower. Even if you plan to stay in all day long and not see a soul, take a shower. It takes the most effort to hop in to the shower but once you do, you'll feel immediate results. It will wake you up and you'll be feeling much fresher (and cleaner too).  Brush and floss your teeth. Give your teeth a good brushing with a floss finish. It's a small task but it feels so good and you can check 'taking care of your health' off the list of things to do.  Do something small on your list. Most of [place] have some small thing we would like to get done (load of laundry, sew a button, email a friend). Doing one of these things will  make you feel like you've accomplished something.  Drink water. Drinking water is easy right? It's also really beneficial for your health so keep a glass beside you all day and take sips often. It gives you energy and prevents you from boredom eating.  Do some floor exercises. The last thing you want to do is exercise but it might be just the thing you need the most. Keep it simple and do exercises that involve sitting or laying on the floor. Even the smallest of exercises release chemicals in the brain that make you feel good. Yoga stretches or core exercises are going to make you feel good with minimal effort.  Make your bed. Making your bed takes a few minutes but it's productive and you'll feel relieved when it's done. An unmade bed is a huge visual reminder that you're having an unproductive day. Do it and consider it your housework for the day.  Put on some nice clothes. Take the sweatpants off even if you don't plan to go anywhere. Put on clothes that make you feel good. Take a look in the mirror so your brain recognizes the sweatpants have been replaced with clothes that make you look great. It's an instant confidence booster.  Wash the dishes. A pile of dirty dishes in the sink is a reflection of your mood. It's possible that if you wash up the dishes, your mood will follow suit. It's worth a try.  Cook a real meal. If you have the luxury to have a "do nothing" day, you have time to make a real meal for yourself. Make a meal that you love to eat. The process is good to get you out of the funk and the food will ensure you have more energy for tomorrow.  Write out your thoughts by hand. When you hand write, you stimulate your brain to focus on the moment that you're in so make yourself comfortable and write whatever comes into your mind. Put those thoughts out on paper so they stop spinning around in your head. Those thoughts might be the very thing holding you down.  Follow up Goal      Follow up:  Patient agrees to Care Plan and Follow-up.  Plan: The Managed Medicaid care management team will reach out to the patient again over the next 90 days.  Date/time of next scheduled Social Work care management/care coordination outreach:  01/31/22 at 2:30 pm.  Dickie La, BSW, MSW, LCSW Managed Medicaid LCSW [facility]  [facility] [email or web address] Phone: [phone]

## 2021-11-02 ENCOUNTER — Other Ambulatory Visit: Payer: Self-pay

## 2021-11-02 ENCOUNTER — Emergency Department
Admission: EM | Admit: 2021-11-02 | Discharge: 2021-11-02 | Disposition: A | Discharge status: Home / Self Care | Payer: Medicaid Other | Attending: Emergency Medicine | Admitting: Emergency Medicine

## 2021-11-02 ENCOUNTER — Encounter: Payer: Self-pay | Admitting: Emergency Medicine

## 2021-11-02 DIAGNOSIS — R1010 Upper abdominal pain, unspecified: Secondary | ICD-10-CM | POA: Insufficient documentation

## 2021-11-02 DIAGNOSIS — F419 Anxiety disorder, unspecified: Secondary | ICD-10-CM | POA: Diagnosis not present

## 2021-11-02 DIAGNOSIS — R1013 Epigastric pain: Secondary | ICD-10-CM | POA: Diagnosis not present

## 2021-11-02 DIAGNOSIS — R197 Diarrhea, unspecified: Secondary | ICD-10-CM | POA: Insufficient documentation

## 2021-11-02 DIAGNOSIS — R112 Nausea with vomiting, unspecified: Secondary | ICD-10-CM | POA: Insufficient documentation

## 2021-11-02 LAB — CBC
HCT: 40.6 % (ref 36.0–46.0)
Hemoglobin: 13.1 g/dL (ref 12.0–15.0)
MCH: 27.8 pg (ref 26.0–34.0)
MCHC: 32.3 g/dL (ref 30.0–36.0)
MCV: 86.2 fL (ref 80.0–100.0)
Platelets: 329 10*3/uL (ref 150–400)
RBC: 4.71 MIL/uL (ref 3.87–5.11)
RDW: 12.8 % (ref 11.5–15.5)
WBC: 7.3 10*3/uL (ref 4.0–10.5)
nRBC: 0 % (ref 0.0–0.2)

## 2021-11-02 LAB — COMPREHENSIVE METABOLIC PANEL
ALT: 16 U/L (ref 0–44)
AST: 22 U/L (ref 15–41)
Albumin: 4.7 g/dL (ref 3.5–5.0)
Alkaline Phosphatase: 60 U/L (ref 38–126)
Anion gap: 10 (ref 5–15)
BUN: 14 mg/dL (ref 6–20)
CO2: 24 mmol/L (ref 22–32)
Calcium: 9.1 mg/dL (ref 8.9–10.3)
Chloride: 103 mmol/L (ref 98–111)
Creatinine, Ser: 0.81 mg/dL (ref 0.44–1.00)
GFR, Estimated: >60 mL/min (ref >60)
Glucose, Bld: 110 mg/dL — ABNORMAL HIGH (ref 70–99)
Potassium: 3.2 mmol/L — ABNORMAL LOW (ref 3.5–5.1)
Sodium: 137 mmol/L (ref 135–145)
Total Bilirubin: 0.7 mg/dL (ref 0.3–1.2)
Total Protein: 8 g/dL (ref 6.5–8.1)

## 2021-11-02 LAB — LIPASE, BLOOD: Lipase: 32 U/L (ref 11–51)

## 2021-11-02 MED ORDER — SODIUM CHLORIDE 0.9 % IV BOLUS
1000.0000 mL | Freq: Once | INTRAVENOUS | Status: AC
Start: 2021-11-02 — End: 2021-11-02
  Administered 2021-11-02: 1000 mL via INTRAVENOUS

## 2021-11-02 MED ORDER — PROMETHAZINE HCL 25 MG/ML IJ SOLN
12.5000 mg | Freq: Once | INTRAMUSCULAR | Status: AC
Start: 2021-11-02 — End: 2021-11-02
  Administered 2021-11-02: 12.5 mg via INTRAMUSCULAR
  Filled 2021-11-02: qty 1

## 2021-11-02 MED ORDER — MORPHINE SULFATE (PF) 2 MG/ML IV SOLN
2.0000 mg | Freq: Once | INTRAVENOUS | Status: AC
  Administered 2021-11-02: 2 mg via INTRAVENOUS
  Filled 2021-11-02: qty 1

## 2021-11-02 MED ORDER — PROMETHAZINE HCL 25 MG/ML IJ SOLN
12.5000 mg | Freq: Once | INTRAMUSCULAR | Status: AC
  Administered 2021-11-02: 12.5 mg via INTRAMUSCULAR
  Filled 2021-11-02: qty 1

## 2021-11-02 MED ORDER — LORAZEPAM 0.5 MG PO TABS
0.5000 mg | ORAL_TABLET | Freq: Once | ORAL | Status: AC
  Administered 2021-11-02: 0.5 mg via ORAL
  Filled 2021-11-02: qty 1

## 2021-11-02 MED ORDER — DICYCLOMINE HCL 10 MG PO CAPS
10.0000 mg | ORAL_CAPSULE | Freq: Once | ORAL | Status: AC
  Administered 2021-11-02: 10 mg via ORAL
  Filled 2021-11-02: qty 1

## 2021-11-02 MED ORDER — POTASSIUM CHLORIDE 20 MEQ PO PACK
40.0000 meq | PACK | ORAL | Status: AC
  Administered 2021-11-02: 40 meq via ORAL
  Filled 2021-11-02: qty 2

## 2021-11-02 MED ORDER — ONDANSETRON HCL 4 MG/2ML IJ SOLN
4.0000 mg | INTRAMUSCULAR | Status: AC
  Administered 2021-11-02: 4 mg via INTRAVENOUS
  Filled 2021-11-02: qty 2

## 2021-11-02 NOTE — ED Triage Notes (Signed)
Pt comes into the ED via POV c/o generalized abd pain and vomiting that started this morning.  Pt does also admit to diarrhea.  Pt presents "hunched" over in the wheelchair due to the pain.  Pt in NAD with even and unlabored respirations.  ?

## 2021-11-02 NOTE — ED Provider Notes (Addendum)
? ?[facility] ?Provider Note ? ? ? Event Date/Time  ? First MD Initiated Contact with Patient 11/02/21 0745   ?  (approximate) ? ? ?History  ? ?Abdominal Pain and Emesis ? ? ?HPI ? ?Tiffany Guzman is a 26 y.o. female who on review of primary care record from January 30 of this year has a history of irritable bowel syndrome, eating disorder, bradycardia, ovarian cyst, previous cholecystectomy ?  ?This morning about 6 AM awoke with severe abdominal cramps nausea slight vomiting mostly dry heaving and slight loose stool ? ?She reports a severe tenderness and pain crampy mostly across her mid upper abdomen.  Feels like something is exploding or cramping within her abdomen. ? ?She reports that she has been told that this is possibly due to smoking marijuana, and she has been trying to taper down but continues to smoke daily.  She had multiple episodes of the same.  She reports this feels roughly similar to when she was seen at a couple other visits recently ? ?No vaginal bleeding.  Denies any urinary symptoms.  No pain or burning with urination. (Also noted, recent negative pregnancy test on prior ED visit) ? ?Physical Exam  ? ?Triage Vital Signs: ?ED Triage Vitals  ?Enc Vitals Group  ?   BP 11/02/21 0736 (!) 135/91  ?   Pulse Rate 11/02/21 0736 77  ?   Resp 11/02/21 0736 18  ?   Temp 11/02/21 0736 (!) 97.5 ?F (36.4 ?C)  ?   Temp Source 11/02/21 0736 Oral  ?   SpO2 11/02/21 0736 100 %  ?   Weight 11/02/21 0733 119 lb 14.9 oz (54.4 kg)  ?   Height 11/02/21 0733 '5\' 4"'$  (1.626 m)  ?   Head Circumference --   ?   Peak Flow --   ?   Pain Score 11/02/21 0733 10  ?   Pain Loc --   ?   Pain Edu? --   ?   Excl. in [place]? --   ? ? ?Most recent vital signs: ?Vitals:  ? 11/02/21 0736 11/02/21 1059  ?BP: (!) 135/91 130/79  ?Pulse: 77 (!) 103  ?Resp: 18 17  ?Temp: (!) 97.5 ?F (36.4 ?C) 98.5 ?F (36.9 ?C)  ?SpO2: 100% 98%  ? ? ? ?General: Awake, appears in painful distress, and reports severe abdominal pain.   Sitting up pain over abdomen. ?CV:  Good peripheral perfusion.  Normal heart tones ?Resp:  Normal effort.  Clear lung sounds ?Abd:  Reports moderate pain to palpation in all quadrants.  No noted focality.  No noted peritonitis.  Patient does exhibit voluntary guarding ?Other:  Peripherally warm and well-perfused.  No respiratory distress. ? ? ?ED Results / Procedures / Treatments  ? ?Labs ?(all labs ordered are listed, but only abnormal results are displayed) ?Labs Reviewed  ?COMPREHENSIVE METABOLIC PANEL - Abnormal; Notable for the following components:  ?    Result Value  ? Potassium 3.2 (*)   ? Glucose, Bld 110 (*)   ? All other components within normal limits  ?LIPASE, BLOOD  ?CBC  ?URINALYSIS, ROUTINE W REFLEX MICROSCOPIC  ?POC URINE PREG, ED  ? ? ? ?EKG ? ? ? ? ?RADIOLOGY ? ?No results found. ? ? ? ? ?PROCEDURES: ? ?Critical Care performed: No ? ?Procedures ? ? ?MEDICATIONS ORDERED IN ED: ?Medications  ?promethazine (PHENERGAN) injection 12.5 mg (12.5 mg Intramuscular Given 11/02/21 0829)  ?ondansetron (ZOFRAN) injection 4 mg (4 mg Intravenous Given 11/02/21  0865)  ?sodium chloride 0.9 % bolus 1,000 mL (0 mLs Intravenous Stopped 11/02/21 0945)  ?morphine (PF) 2 MG/ML injection 2 mg (2 mg Intravenous Given 11/02/21 0816)  ?promethazine (PHENERGAN) injection 12.5 mg (12.5 mg Intramuscular Given 11/02/21 0944)  ?morphine (PF) 2 MG/ML injection 2 mg (2 mg Intravenous Given 11/02/21 0910)  ?potassium chloride (KLOR-CON) packet 40 mEq (40 mEq Oral Given 11/02/21 1022)  ?dicyclomine (BENTYL) capsule 10 mg (10 mg Oral Given 11/02/21 1022)  ?LORazepam (ATIVAN) tablet 0.5 mg (0.5 mg Oral Given 11/02/21 1022)  ? ? ? ?IMPRESSION / MDM / ASSESSMENT AND PLAN / ED COURSE  ?I reviewed the triage vital signs and the nursing notes. ?             ?               ? ?Differential diagnosis includes, but is not limited to, possible recurrent episodes of irritable bowel syndrome or potentially hyper emesis secondary to ongoing cannabinoid  use, acute gastroenteritis, colitis, enteritis etc.  She was recently seen and treated for enteritis.  She reports her symptoms from her visit on April 8 had all gone away within a day or 2 of her visit but now she reports same symptoms once again suddenly.  On review of her records she seems to have a history of multiple visits with similar presentations and she herself reports this as well. ? ?I discussed with the patient, she reports that Zofran and Phenergan and pain medication are generally helpful.  We will keep her on the monitor, and provide Phenergan and Zofran morphine and hydration.  I discussed with the patient as well considering additional or repeat CT imaging, but clinically has her physician and review of her records I find that a repeat imaging study at this point may be of low yield and potentially of elevated risk due to her multiple CTs and risks of radiation moving forward in her future.  ? ?Plan to reassess after antiemetics and pain relief. ? ? ? ?The patient is on the cardiac monitor to evaluate for evidence of arrhythmia and/or significant heart rate changes. ? ?Clinical Course as of 11/02/21 1118  ?Fri Nov 02, 2021  ?7846 Reports starting to feel somewhat improved, but still nauseated.  She appears overall improved fully alert, would like a small additional dose of pain medicine and some additional nausea medicine. [MQ]  ?0939 Patient reports that she is feeling better, would like to be able to consider discharging after repeat dose of Phenergan.  She is advised me that she will call for a ride and will not drive her own car today.  Knows not to drive today after receiving medications that can be sedating. [MQ]  ?1006 Patient no longer having any voluntary guarding.  Her abdomen is soft now nontender nondistended throughout except she reports just feels just slightly sore.  She feels much better overall but reports she still feels slightly nauseated and a little bit anxious.  She like to be  able to go home soon and feels like she is about ready to discharge, but requesting to have a small dose of anxiety medication.  She reports anxiety seems also to cause her symptoms.  We will give oral Ativan.  Patient not driving home.  P.o. challenge [MQ]  ?1107 Patient drinking juice and water.  Requesting discharge.  Reports she has nausea medications already at home and does not need any prescriptions.  She has a friend coming to pick her up  and is not driving today.  She appears comfortable without distress.  Reports no ongoing pain or nausea. [MQ]  ?1107 Patient appears appropriate for discharge.  Suspect recurrent episode of nausea vomiting, and in discussion further with the patient I suspect it may be secondary to her daily use of cannabinoids, but I cannot confirm this.  Nonetheless her evaluation very reassuring.  Will discharge.  Discussed careful abdominal pain return precautions and recommended follow-up with GI which I will provide her [MQ]  ?1108  referral to as well. [MQ]  ?1108 Return precautions and treatment recommendations and follow-up discussed with the patient who is agreeable with the plan. ? [MQ]  ?  ?Clinical Course User Index ?[MQ] Delman Kitten, MD  ? ?On reexamination of the abdomen at 10:45 AM, patient reports no pain or discomfort.   Ready for discharge to home.  She appears well nontoxic fully alert without distress.  Much improved from presentation. ? ?Return precautions and treatment recommendations and follow-up discussed with the patient who is agreeable with the plan. ? ? ?FINAL CLINICAL IMPRESSION(S) / ED DIAGNOSES  ? ?Final diagnoses:  ?Nausea and vomiting, unspecified vomiting type  ? ? ? ?Rx / DC Orders  ? ?ED Discharge Orders   ? ? None  ? ?  ? ? ? ?Note:  This document was prepared using Dragon voice recognition software and may include unintentional dictation errors. ?  Delman Kitten, MD ?11/02/21 1119 ? ?  Delman Kitten, MD ?11/02/21 1119 ? ?

## 2021-11-02 NOTE — Discharge Instructions (Addendum)
No driving today. ? ?Please return to the emergency room right away if you are to develop a fever, severe nausea, your pain becomes severe or worsens, you are unable to keep food down, begin vomiting any dark or bloody fluid, you develop any dark or bloody stools, feel dehydrated, or other new concerns or symptoms arise. ? ?

## 2021-11-04 ENCOUNTER — Other Ambulatory Visit: Payer: Self-pay

## 2021-11-04 ENCOUNTER — Emergency Department
Admission: EM | Admit: 2021-11-04 | Discharge: 2021-11-05 | Discharge status: Against Medical Advice | Payer: Medicaid Other | Attending: Emergency Medicine | Admitting: Emergency Medicine

## 2021-11-04 ENCOUNTER — Emergency Department: Payer: Medicaid Other

## 2021-11-04 DIAGNOSIS — R197 Diarrhea, unspecified: Secondary | ICD-10-CM | POA: Insufficient documentation

## 2021-11-04 DIAGNOSIS — I10 Essential (primary) hypertension: Secondary | ICD-10-CM | POA: Diagnosis not present

## 2021-11-04 DIAGNOSIS — R109 Unspecified abdominal pain: Secondary | ICD-10-CM | POA: Diagnosis not present

## 2021-11-04 DIAGNOSIS — R1013 Epigastric pain: Secondary | ICD-10-CM | POA: Insufficient documentation

## 2021-11-04 DIAGNOSIS — R112 Nausea with vomiting, unspecified: Secondary | ICD-10-CM | POA: Diagnosis not present

## 2021-11-04 DIAGNOSIS — R1110 Vomiting, unspecified: Secondary | ICD-10-CM | POA: Diagnosis not present

## 2021-11-04 DIAGNOSIS — R52 Pain, unspecified: Secondary | ICD-10-CM | POA: Diagnosis not present

## 2021-11-04 LAB — URINALYSIS, ROUTINE W REFLEX MICROSCOPIC
Bacteria, UA: NONE SEEN
Bilirubin Urine: NEGATIVE
Glucose, UA: NEGATIVE mg/dL
Hgb urine dipstick: NEGATIVE
Ketones, ur: 80 mg/dL — AB
Leukocytes,Ua: NEGATIVE
Nitrite: NEGATIVE
Protein, ur: 30 mg/dL — AB
Specific Gravity, Urine: 1.027 (ref 1.005–1.030)
pH: 6 (ref 5.0–8.0)

## 2021-11-04 LAB — CBC WITH DIFFERENTIAL/PLATELET
Abs Immature Granulocytes: 0.02 10*3/uL (ref 0.00–0.07)
Basophils Absolute: 0 10*3/uL (ref 0.0–0.1)
Basophils Relative: 0 %
Eosinophils Absolute: 0 10*3/uL (ref 0.0–0.5)
Eosinophils Relative: 0 %
HCT: 35 % — ABNORMAL LOW (ref 36.0–46.0)
Hemoglobin: 11.3 g/dL — ABNORMAL LOW (ref 12.0–15.0)
Immature Granulocytes: 0 %
Lymphocytes Relative: 13 %
Lymphs Abs: 1.2 10*3/uL (ref 0.7–4.0)
MCH: 27.5 pg (ref 26.0–34.0)
MCHC: 32.3 g/dL (ref 30.0–36.0)
MCV: 85.2 fL (ref 80.0–100.0)
Monocytes Absolute: 0.5 10*3/uL (ref 0.1–1.0)
Monocytes Relative: 5 %
Neutro Abs: 8 10*3/uL — ABNORMAL HIGH (ref 1.7–7.7)
Neutrophils Relative %: 82 %
Platelets: 293 10*3/uL (ref 150–400)
RBC: 4.11 MIL/uL (ref 3.87–5.11)
RDW: 13.1 % (ref 11.5–15.5)
WBC: 9.7 10*3/uL (ref 4.0–10.5)
nRBC: 0 % (ref 0.0–0.2)

## 2021-11-04 LAB — COMPREHENSIVE METABOLIC PANEL
ALT: 21 U/L (ref 0–44)
AST: 30 U/L (ref 15–41)
Albumin: 4.1 g/dL (ref 3.5–5.0)
Alkaline Phosphatase: 46 U/L (ref 38–126)
Anion gap: 9 (ref 5–15)
BUN: 16 mg/dL (ref 6–20)
CO2: 22 mmol/L (ref 22–32)
Calcium: 8.5 mg/dL — ABNORMAL LOW (ref 8.9–10.3)
Chloride: 105 mmol/L (ref 98–111)
Creatinine, Ser: 0.71 mg/dL (ref 0.44–1.00)
GFR, Estimated: >60 mL/min (ref >60)
Glucose, Bld: 95 mg/dL (ref 70–99)
Potassium: 3.6 mmol/L (ref 3.5–5.1)
Sodium: 136 mmol/L (ref 135–145)
Total Bilirubin: 0.8 mg/dL (ref 0.3–1.2)
Total Protein: 7.2 g/dL (ref 6.5–8.1)

## 2021-11-04 LAB — LIPASE, BLOOD: Lipase: 33 U/L (ref 11–51)

## 2021-11-04 LAB — POC URINE PREG, ED: Preg Test, Ur: NEGATIVE

## 2021-11-04 MED ORDER — IOHEXOL 300 MG/ML  SOLN
75.0000 mL | Freq: Once | INTRAMUSCULAR | Status: AC | PRN
  Administered 2021-11-04: 75 mL via INTRAVENOUS

## 2021-11-04 MED ORDER — DROPERIDOL 2.5 MG/ML IJ SOLN
2.5000 mg | Freq: Once | INTRAMUSCULAR | Status: AC
  Administered 2021-11-04: 2.5 mg via INTRAVENOUS
  Filled 2021-11-04: qty 2

## 2021-11-04 MED ORDER — LACTATED RINGERS IV BOLUS
1000.0000 mL | Freq: Once | INTRAVENOUS | Status: AC
  Administered 2021-11-04: 1000 mL via INTRAVENOUS

## 2021-11-04 NOTE — ED Triage Notes (Signed)
Arrived via EMS, reports generalized abdominal pain, nausea and vomiting. States she was evaluated Thursday and last night for the same symptoms at another facility and was discharged with diagnosis of cannabinoid syndrome. States she last smoked last night. AOX4, holding arms over abdomen and moaning continuously during triage. Resp even, unlabored on RA. States she was also diagnosed with right ovarian cyst two weeks ago.  ?

## 2021-11-04 NOTE — ED Provider Notes (Signed)
Clinical Course as of 11/05/21 0059  ?Sun Nov 04, 2021  ?2342 Assumed care at 11pm from Dr. Cheri Fowler.  Patient reporting abdominal pain, nausea and vomiting, consistent with cannabinoid hyperemesis syndrome or cyclic vomiting syndrome.  Reportedly patient was very adamant about needing a CT scan given prior episode of and concern for recurrent enteritis.  Dr. Cheri Fowler explained to the patient that it would not change the management but agreed to the CT scan to rule out any acute or emergent condition. ? ?Patient more comfortable now and tolerated the CT scan.  I personally reviewed the scan as well as the radiology report.  No acute abnormalities. ? ?I reviewed  [CF]  ?Mon Nov 05, 2021  ?0058 Before I could come talk to the patient, I had four EMS patient arrivals including 2 critical patients requiring emergent intervention.  I was told after the fact that the patient says she wanted to leave, pulled out her own IV, and eloped.  She was ambulatory by herself and reportedly no longer symptomatic. [CF]  ?  ?Clinical Course User Index ?[CF] Hinda Kehr, MD  ? ? ?  ?Hinda Kehr, MD ?11/05/21 [phone] ? ?

## 2021-11-04 NOTE — ED Provider Notes (Signed)
? ?[facility] ?Provider Note ? ? Event Date/Time  ? First MD Initiated Contact with Patient 11/04/21 2126   ?  (approximate) ?History  ?Abdominal Pain ? ?HPI ?Tiffany Guzman is a 26 y.o. female with a stated past medical history of IBS, bradycardia, ovarian cyst, and status post cholecystectomy who presents for continued abdominal cramps, nausea, vomiting, and diarrhea.  Patient complains of of cramping and tenderness in the epigastric region that is 10/10 and nonradiating.  This is the third time in 2 weeks the patient has been seen for similar symptoms.  States she was seen here last night and told that this was possibly due to her marijuana abuse however patient endorses continued use with last use last night.  Patient currently denies any vision changes, tinnitus, difficulty speaking, facial droop, sore throat, chest pain, shortness of breath, dysuria, or weakness/numbness/paresthesias in any extremity ?Physical Exam  ?Triage Vital Signs: ?ED Triage Vitals  ?Enc Vitals Group  ?   BP 11/04/21 2126 127/70  ?   Pulse Rate 11/04/21 2126 80  ?   Resp 11/04/21 2126 16  ?   Temp 11/04/21 2126 98.2 ?F (36.8 ?C)  ?   Temp Source 11/04/21 2126 Oral  ?   SpO2 11/04/21 2126 99 %  ?   Weight 11/04/21 2127 120 lb (54.4 kg)  ?   Height 11/04/21 2127 '5\' 4"'$  (1.626 m)  ?   Head Circumference --   ?   Peak Flow --   ?   Pain Score 11/04/21 2127 10  ?   Pain Loc --   ?   Pain Edu? --   ?   Excl. in [place]? --   ? ?Most recent vital signs: ?Vitals:  ? 11/04/21 2126 11/04/21 2200  ?BP: 127/70 113/69  ?Pulse: 80 61  ?Resp: 16 16  ?Temp: 98.2 ?F (36.8 ?C)   ?SpO2: 99% 99%  ? ?General: Awake, oriented x4. ?CV:  Good peripheral perfusion.  ?Resp:  Normal effort.  ?Abd:  No distention.  ?Other:  Young adult Caucasian female laying in bed in no distress and begins moaning and holding her abdomen when provider walks in the room ?ED Results / Procedures / Treatments  ?Labs ?(all labs ordered are listed, but only  abnormal results are displayed) ?Labs Reviewed  ?COMPREHENSIVE METABOLIC PANEL - Abnormal; Notable for the following components:  ?    Result Value  ? Calcium 8.5 (*)   ? All other components within normal limits  ?CBC WITH DIFFERENTIAL/PLATELET - Abnormal; Notable for the following components:  ? Hemoglobin 11.3 (*)   ? HCT 35.0 (*)   ? Neutro Abs 8.0 (*)   ? All other components within normal limits  ?LIPASE, BLOOD  ?URINALYSIS, ROUTINE W REFLEX MICROSCOPIC  ?ETHANOL  ?HCG, QUANTITATIVE, PREGNANCY  ?POC URINE PREG, ED  ? ?PROCEDURES: ?Critical Care performed: No ?.1-3 Lead EKG Interpretation ?Performed by: Naaman Plummer, MD ?Authorized by: Naaman Plummer, MD  ? ?  Interpretation: normal   ?  ECG rate:  62 ?  ECG rate assessment: normal   ?  Rhythm: sinus rhythm   ?  Ectopy: none   ?  Conduction: normal   ?MEDICATIONS ORDERED IN ED: ?Medications  ?iohexol (OMNIPAQUE) 300 MG/ML solution 75 mL (has no administration in time range)  ?droperidol (INAPSINE) 2.5 MG/ML injection 2.5 mg (2.5 mg Intravenous Given 11/04/21 2249)  ? ?IMPRESSION / MDM / ASSESSMENT AND PLAN / ED COURSE  ?I reviewed the  triage vital signs and the nursing notes. ?             ?               ?The patient is on the cardiac monitor to evaluate for evidence of arrhythmia and/or significant heart rate changes. ?Patient?s symptoms not typical for emergent causes of abdominal pain such as, but not limited to, appendicitis, abdominal aortic aneurysm, surgical biliary disease, pancreatitis, SBO, mesenteric ischemia, serious intra-abdominal bacterial illness. ?Presentation also not typical of gynecologic emergencies such as TOA, Ovarian Torsion, PID. Not Ectopic. ?Doubt atypical ACS. ?CT abdomen/pelvis: Pending ?Disposition: Pending ? ?  ?FINAL CLINICAL IMPRESSION(S) / ED DIAGNOSES  ? ?Final diagnoses:  ?Epigastric pain  ?Nausea and vomiting, unspecified vomiting type  ? ?Rx / DC Orders  ? ?ED Discharge Orders   ? ? None  ? ?  ? ?Note:  This document  was prepared using Dragon voice recognition software and may include unintentional dictation errors. ?  ?Naaman Plummer, MD ?11/04/21 2311 ? ?

## 2021-11-05 LAB — HCG, QUANTITATIVE, PREGNANCY: hCG, Beta Chain, Quant, S: 1 m[IU]/mL (ref <5)

## 2021-11-05 NOTE — ED Notes (Signed)
This Probation officer was notified by the unit secretary to inform this Probation officer patient wanted to leave and witnessed patient walking out.  ?

## 2021-11-05 NOTE — ED Notes (Signed)
Notified by melody knudsen that pt was leaving and to make sure pt's iv is removed. Pt has removed own iv, this rn dressed site.  ?

## 2021-11-06 ENCOUNTER — Telehealth: Payer: Self-pay

## 2021-11-06 ENCOUNTER — Other Ambulatory Visit: Payer: Self-pay | Admitting: *Deleted

## 2021-11-06 ENCOUNTER — Ambulatory Visit (INDEPENDENT_AMBULATORY_CARE_PROVIDER_SITE_OTHER): Payer: Medicaid Other | Admitting: Family Medicine

## 2021-11-06 ENCOUNTER — Encounter: Payer: Self-pay | Admitting: Family Medicine

## 2021-11-06 VITALS — BP 130/70 | HR 99 | Temp 98.6°F | Resp 16 | Ht 64.0 in | Wt 115.7 lb

## 2021-11-06 DIAGNOSIS — F1290 Cannabis use, unspecified, uncomplicated: Secondary | ICD-10-CM | POA: Diagnosis not present

## 2021-11-06 DIAGNOSIS — R112 Nausea with vomiting, unspecified: Secondary | ICD-10-CM | POA: Diagnosis not present

## 2021-11-06 DIAGNOSIS — R1084 Generalized abdominal pain: Secondary | ICD-10-CM

## 2021-11-06 DIAGNOSIS — Z09 Encounter for follow-up examination after completed treatment for conditions other than malignant neoplasm: Secondary | ICD-10-CM | POA: Diagnosis not present

## 2021-11-06 MED ORDER — ONDANSETRON 4 MG PO TBDP: 4.0000 mg | ORAL_TABLET | Freq: Three times a day (TID) | ORAL | 1 refills | Status: DC | PRN

## 2021-11-06 MED ORDER — PROMETHAZINE HCL 25 MG/ML IJ SOLN
25.0000 mg | Freq: Four times a day (QID) | INTRAMUSCULAR | Status: DC | PRN
  Administered 2021-11-06: 25 mg via INTRAMUSCULAR

## 2021-11-06 MED ORDER — DICYCLOMINE HCL 20 MG PO TABS: 20.0000 mg | ORAL_TABLET | Freq: Three times a day (TID) | ORAL | 0 refills | Status: DC | PRN

## 2021-11-06 MED ORDER — PROMETHAZINE HCL 25 MG RE SUPP: 25.0000 mg | Freq: Four times a day (QID) | RECTAL | 0 refills | Status: DC | PRN

## 2021-11-06 MED ORDER — LORAZEPAM 1 MG PO TABS: 0.5000 mg | ORAL_TABLET | Freq: Every day | ORAL | 0 refills | Status: DC | PRN

## 2021-11-06 NOTE — Patient Instructions (Signed)
Visit Information ? ?[name]  - as a part of your Medicaid benefit, you are eligible for care management and care coordination services at no cost or copay. I was unable to reach you by phone today but would be happy to help you with your health related needs. Please feel free to call me @ [phone].  ? ?A member of the Managed Medicaid care management team will reach out to you again over the next 14 days.  ? ?Lurena Joiner RN, BSN ?[place] ?RN Care Coordinator ?  ?

## 2021-11-06 NOTE — Telephone Encounter (Signed)
Transition Care Management Unsuccessful Follow-up Telephone Call ? ?Date of discharge and from where:  11/05/2021 from [facility] ? ?Attempts:  1st Attempt ? ?Reason for unsuccessful TCM follow-up call:  Unable to leave message ? ? ? ?

## 2021-11-06 NOTE — Patient Outreach (Signed)
Care Coordination ? ?11/06/2021 ? ?[name] ?1995-08-09 ?[id number] ? ? ?Medicaid Managed Care  ? ?Unsuccessful Outreach Note ? ?11/06/2021 ?Name: Tiffany Guzman MRN: [id number] DOB: 12/19/1995 ? ?Referred by: [name], PA-C ?Reason for referral : High Risk Managed Medicaid (Unsuccessful RNCM follow up telephone outreach) ? ? ?An unsuccessful telephone outreach was attempted today. The patient was referred to the case management team for assistance with care management and care coordination.  ? ?Follow Up Plan: The care management team will reach out to the patient again over the next 14 days.  ? ?Lurena Joiner RN, BSN ?[place] ?RN Care Coordinator ? ? ?

## 2021-11-06 NOTE — Progress Notes (Signed)
? ? ?Patient ID: Tiffany Guzman, female    DOB: 05/23/1996, 26 y.o.   MRN: [id number] ? ?PCP: [name], PA-C ? ?Chief Complaint  ?Patient presents with  ? Emesis  ?  Pt has been several times to ER due to feeling nauseous and has vomited several times. Pt can not keep food in, only liquids. Onset since Friday. Pt has been taking zofran and it does not help her.  ? ? ?Subjective:  ? ?Tiffany Guzman is a 26 y.o. female, presents to clinic with CC of the following: ? ?HPI  ?Pt presents for N/V abdominal pain CHS ?Pain worse since Friday no improvement in the ED and she left prior to completion of work up  ?Labs and CT were reviewed with the pt ?She reports feeling a little bit better yesterday she has been able to sip on some Pedialyte, she woke up with worse symptoms today feels shaky and having diffuse muscle spasms having dry heaving and some nausea vomiting and diarrhea she denies any blood in stool or vomit ?She also has a history of IBS is not currently seeing a GI specialist, states she is taking all of her medications ?She asked for refill on Zofran, otherwise has Protonix Bentyl and olanzapine on the chart ?She was prescribed viberzi but never took it ? ?She states she uses delta age cannabis products from the vape stores she uses it daily she states is in a pen and squirts liquid into her mouth she does not know the dosing, states she has been using daily for several months.  Her last use was Friday ?Hot baths and being in sunlight or warm weather do help her feel somewhat better but she has not tried any hot showers. ?She states that the ER does not do anything for her and any medications or fluids to give her never help so that is what she left the other night.,  She has even had Haldol before but this is not helpful ?She asked recently to help with pain citing generalized abdominal pain ?She denies any other illegal drug use or any use of other prescription medications. ?Reviewed her labs from  the ER yesterday independently relative to today's visit -labs were done on 4/8, 4/14 and 4/16 - [place] and then here at [facility] ? ? ?Patient Active Problem List  ? Diagnosis Date Noted  ? IBS (irritable bowel syndrome) 03/15/2021  ? 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 Bradycardia 02/26/2021  ? Right ovarian cyst 02/25/2021  ? Leukocytosis 02/25/2021  ? 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 S/P cholecystectomy   ? Status post cesarean delivery 11/14/2017  ? Dermoid cyst 10/03/2017  ? Dermoid cyst of left ovary 10/01/2017  ? Anxiety 06/24/2016  ? ? ? ? ?Current Outpatient Medications:  ?  acetaminophen (TYLENOL) 500 MG tablet, Take 2 tablets (1,000 mg total) by mouth every 6 (six) hours as needed for mild pain., Disp: , Rfl:  ?  amoxicillin-clavulanate (AUGMENTIN) 875-125 MG tablet, Take 1 tablet by mouth 2 (two) times daily for 10 days., Disp: 20 tablet, Rfl: 0 ?  dicyclomine (BENTYL) 10 MG capsule, Take 2 capsules (20 mg total) by mouth 4 (four) times daily -  before meals and at bedtime., Disp: 240 capsule, Rfl: 0 ?  Eluxadoline (VIBERZI) 75 MG TABS, Take 75 mg by mouth 2 (two) times daily with a meal., Disp: 60 tablet, Rfl: 0 ?  ibuprofen (ADVIL) 400 MG  tablet, Take 1.5 tablets (600 mg total) by mouth every 8 (eight) hours as needed for headache or mild pain., Disp: 10 tablet, Rfl: 0 ?  Multiple Vitamins-Minerals (WOMENS MULTIVITAMIN) TABS, Take 1 tablet by mouth daily., Disp: , Rfl:  ?  OLANZapine (ZYPREXA) 2.5 MG tablet, Take 1 tablet (2.5 mg total) by mouth at bedtime., Disp: 90 tablet, Rfl: 1 ?  ondansetron (ZOFRAN) 4 MG tablet, Take by mouth., Disp: , Rfl:  ?  ondansetron (ZOFRAN-ODT) 4 MG disintegrating tablet, Take 1 tablet (4 mg total) by mouth every 8 (eight) hours as needed for nausea or vomiting., Disp: 20 tablet, Rfl: 0 ?  pantoprazole (PROTONIX) 40 MG tablet, Take 1 tablet (40 mg total) by mouth daily., Disp: 30 tablet, Rfl: 1 ? ? ?Allergies  ?Allergen Reactions   ? Buspar [Buspirone]   ?  Dystonic reaction - arms, legs, jaw "locked up"  ? ? ? ?Social History  ? ?Tobacco Use  ? Smoking status: Never  ? Smokeless tobacco: Never  ?Vaping Use  ? Vaping Use: Every day  ? Start date: 09/20/2019  ? Substances: Nicotine, Flavoring  ?Substance Use Topics  ? Alcohol use: No  ?  Alcohol/week: 0.0 standard drinks  ? Drug use: Yes  ?  Frequency: 7.0 times per week  ?  Types: Marijuana  ?  Comment: has stopped for 10 days  ?  ? ? ?Chart Review Today: ?I personally reviewed active problem list, medication list, allergies, family history, social history, health maintenance, notes from last encounter, lab results, imaging with the patient/caregiver today. ? ? ?Review of Systems  ?Constitutional: Negative.   ?HENT: Negative.    ?Eyes: Negative.   ?Respiratory: Negative.    ?Cardiovascular: Negative.   ?Gastrointestinal: Negative.   ?Endocrine: Negative.   ?Genitourinary: Negative.   ?Musculoskeletal: Negative.   ?Skin: Negative.   ?Allergic/Immunologic: Negative.   ?Neurological: Negative.   ?Hematological: Negative.   ?Psychiatric/Behavioral: Negative.    ?All other systems reviewed and are negative. ? ?   ?Objective:  ? ?Vitals:  ? 11/06/21 0944  ?BP: 130/70  ?Pulse: 99  ?Resp: 16  ?Temp: 98.6 ?F (37 ?C)  ?TempSrc: Oral  ?SpO2: 100%  ?Weight: 115 lb 11.2 oz (52.5 kg)  ?Height: '5\' 4"'$  (1.626 m)  ?  ?Body mass index is 19.86 kg/m?. ? ?Physical Exam ?Vitals reviewed.  ?Constitutional:   ?   General: She is not in acute distress. ?   Appearance: Normal appearance. She is normal weight. She is ill-appearing. She is not toxic-appearing or diaphoretic.  ?   Comments: Appears ills, slightly shaking, intermittent dry heaving, looks uncomfortable   ?HENT:  ?   Head: Normocephalic and atraumatic.  ?   Mouth/Throat:  ?   Mouth: Mucous membranes are moist.  ?   Pharynx: Oropharynx is clear. No oropharyngeal exudate or posterior oropharyngeal erythema.  ?Eyes:  ?   General: No scleral icterus.    ?   Right  eye: No discharge.     ?   Left eye: No discharge.  ?   Conjunctiva/sclera: Conjunctivae normal.  ?Cardiovascular:  ?   Rate and Rhythm: Normal rate and regular rhythm.  ?   Pulses: Normal pulses.  ?   Heart sounds: Normal heart sounds.  ?Pulmonary:  ?   Effort: Pulmonary effort is normal.  ?   Breath sounds: Normal breath sounds.  ?Abdominal:  ?   General: Bowel sounds are normal. There is no distension.  ?   Palpations: Abdomen is  soft.  ?   Tenderness: There is abdominal tenderness. There is no right CVA tenderness, left CVA tenderness, guarding or rebound.  ?Skin: ?   Capillary Refill: Capillary refill takes less than 2 seconds.  ?   Coloration: Skin is not jaundiced or pale.  ?Neurological:  ?   Mental Status: She is alert.  ?   Gait: Gait normal.  ?  ? ?Results for orders placed or performed during the hospital encounter of 11/04/21  ?Comprehensive metabolic panel  ?Result Value Ref Range  ? Sodium 136 135 - 145 mmol/L  ? Potassium 3.6 3.5 - 5.1 mmol/L  ? Chloride 105 98 - 111 mmol/L  ? CO2 22 22 - 32 mmol/L  ? Glucose, Bld 95 70 - 99 mg/dL  ? BUN 16 6 - 20 mg/dL  ? Creatinine, Ser 0.71 0.44 - 1.00 mg/dL  ? Calcium 8.5 (L) 8.9 - 10.3 mg/dL  ? Total Protein 7.2 6.5 - 8.1 g/dL  ? Albumin 4.1 3.5 - 5.0 g/dL  ? AST 30 15 - 41 U/L  ? ALT 21 0 - 44 U/L  ? Alkaline Phosphatase 46 38 - 126 U/L  ? Total Bilirubin 0.8 0.3 - 1.2 mg/dL  ? GFR, Estimated >60 >60 mL/min  ? Anion gap 9 5 - 15  ?CBC with Differential  ?Result Value Ref Range  ? WBC 9.7 4.0 - 10.5 K/uL  ? RBC 4.11 3.87 - 5.11 MIL/uL  ? Hemoglobin 11.3 (L) 12.0 - 15.0 g/dL  ? HCT 35.0 (L) 36.0 - 46.0 %  ? MCV 85.2 80.0 - 100.0 fL  ? MCH 27.5 26.0 - 34.0 pg  ? MCHC 32.3 30.0 - 36.0 g/dL  ? RDW 13.1 11.5 - 15.5 %  ? Platelets 293 150 - 400 K/uL  ? nRBC 0.0 0.0 - 0.2 %  ? Neutrophils Relative % 82 %  ? Neutro Abs 8.0 (H) 1.7 - 7.7 K/uL  ? Lymphocytes Relative 13 %  ? Lymphs Abs 1.2 0.7 - 4.0 K/uL  ? Monocytes Relative 5 %  ? Monocytes Absolute 0.5 0.1 - 1.0 K/uL   ? Eosinophils Relative 0 %  ? Eosinophils Absolute 0.0 0.0 - 0.5 K/uL  ? Basophils Relative 0 %  ? Basophils Absolute 0.0 0.0 - 0.1 K/uL  ? Immature Granulocytes 0 %  ? Abs Immature Granulocytes 0.

## 2021-11-07 NOTE — Telephone Encounter (Signed)
Transition Care Management Unsuccessful Follow-up Telephone Call ? ?Date of discharge and from where:  11/05/2021 from [facility] ? ?Attempts:  2nd Attempt ? ?Reason for unsuccessful TCM follow-up call:  Unable to leave message ? ? ? ?

## 2021-11-08 ENCOUNTER — Ambulatory Visit (INDEPENDENT_AMBULATORY_CARE_PROVIDER_SITE_OTHER): Payer: Medicaid Other | Admitting: Family Medicine

## 2021-11-08 ENCOUNTER — Encounter: Payer: Self-pay | Admitting: Family Medicine

## 2021-11-08 VITALS — BP 106/72 | HR 100 | Temp 97.7°F | Resp 14 | Ht 64.0 in | Wt 111.3 lb

## 2021-11-08 DIAGNOSIS — Z79899 Other long term (current) drug therapy: Secondary | ICD-10-CM | POA: Diagnosis not present

## 2021-11-08 DIAGNOSIS — F1290 Cannabis use, unspecified, uncomplicated: Secondary | ICD-10-CM

## 2021-11-08 DIAGNOSIS — R112 Nausea with vomiting, unspecified: Secondary | ICD-10-CM | POA: Diagnosis not present

## 2021-11-08 DIAGNOSIS — F419 Anxiety disorder, unspecified: Secondary | ICD-10-CM

## 2021-11-08 MED ORDER — SERTRALINE HCL 50 MG PO TABS: ORAL_TABLET | ORAL | 3 refills | Status: DC

## 2021-11-08 MED ORDER — ONDANSETRON 4 MG PO TBDP: 4.0000 mg | ORAL_TABLET | Freq: Three times a day (TID) | ORAL | 1 refills | Status: DC | PRN

## 2021-11-08 NOTE — Progress Notes (Deleted)
Name: Tiffany Guzman   MRN: [id number]    DOB: [birth year]   Date:11/08/2021 ? ?     Progress Note ? ?Subjective ? ?Chief Complaint ? ?2-day follow up  ? ?HPI ? ? Emesis  ?  Pt has been several times to ER due to feeling nauseous and has vomited several times. Pt can not keep food in, only liquids. Onset since Friday. Pt has been taking zofran and it does not help her.  ? ?Pt presents for N/V abdominal pain CHS ?Pain worse since Friday no improvement in the ED and she left prior to completion of work up  ?Labs and CT were reviewed with the pt ?She reports feeling a little bit better yesterday she has been able to sip on some Pedialyte, she woke up with worse symptoms today feels shaky and having diffuse muscle spasms having dry heaving and some nausea vomiting and diarrhea she denies any blood in stool or vomit ?She also has a history of IBS is not currently seeing a GI specialist, states she is taking all of her medications ?She asked for refill on Zofran, otherwise has Protonix Bentyl and olanzapine on the chart ?She was prescribed viberzi but never took it ? ?She states she uses delta age cannabis products from the vape stores she uses it daily she states is in a pen and squirts liquid into her mouth she does not know the dosing, states she has been using daily for several months.  Her last use was Friday ?Hot baths and being in sunlight or warm weather do help her feel somewhat better but she has not tried any hot showers. ?She states that the ER does not do anything for her and any medications or fluids to give her never help so that is what she left the other night.,  She has even had Haldol before but this is not helpful ?She asked recently to help with pain citing generalized abdominal pain ?She denies any other illegal drug use or any use of other prescription medications. ?Reviewed her labs from the ER yesterday independently relative to today's visit -labs were done on 4/8, 4/14 and 4/16 - 2 [place]  [place] and then here at [facility] ? ? ? ?Patient Active Problem List  ? Diagnosis Date Noted  ? IBS (irritable bowel syndrome) 03/15/2021  ? 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 Bradycardia 02/26/2021  ? Right ovarian cyst 02/25/2021  ? Leukocytosis 02/25/2021  ? 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 S/P cholecystectomy   ? Status post cesarean delivery 11/14/2017  ? Dermoid cyst 10/03/2017  ? Dermoid cyst of left ovary 10/01/2017  ? Anxiety 06/24/2016  ? ? ?Past Surgical History:  ?Procedure Laterality Date  ? CESAREAN SECTION N/A 11/14/2017  ? Procedure: CESAREAN SECTION;  Surgeon: Will Bonnet, MD;  Location: [facility] ORS;  Service: Obstetrics;  Laterality: N/A;  ? ESOPHAGOGASTRODUODENOSCOPY Left 02/17/2019  ? Procedure: ESOPHAGOGASTRODUODENOSCOPY (EGD);  Surgeon: Virgel Manifold, MD;  Location: [facility] ENDOSCOPY;  Service: Endoscopy;  Laterality: Left;  ? INSERTION OF NON VAGINAL CONTRACEPTIVE DEVICE    ? LAPAROSCOPIC OVARIAN CYSTECTOMY Right 02/23/2019  ? Procedure: LAPAROSCOPIC OVARIAN CYSTECTOMY;  Surgeon: Homero Fellers, MD;  Location: [facility] ORS;  Service: Gynecology;  Laterality: Right;  ? LAPAROSCOPY N/A 02/25/2021  ? Procedure: LAPAROSCOPY DIAGNOSTIC;  Surgeon: Will Bonnet, MD;  Location: [facility] ORS;  Service: Gynecology;  Laterality: N/A;  ? OOPHORECTOMY Left 10/03/2017  ? Procedure: OOPHORECTOMY;  Surgeon:  Homero Fellers, MD;  Location: [facility] ORS;  Service: Gynecology;  Laterality: Left;  ? OVARIAN CYST REMOVAL Left 10/03/2017  ? Procedure: OVARIAN CYSTECTOMY;  Surgeon: Homero Fellers, MD;  Location: [facility] ORS;  Service: Gynecology;  Laterality: Left;  ? OVARIAN CYST REMOVAL Right 02/25/2021  ? Procedure: OVARIAN CYSTECTOMY;  Surgeon: Will Bonnet, MD;  Location: [facility] ORS;  Service: Gynecology;  Laterality: Right;  ? ? ?Family History  ?Problem Relation Age of Onset  ? Hypertension Mother   ? Hypothyroidism Mother   ? Depression Mother   ? Diabetes  Maternal Grandmother   ? ? ?Social History  ? ?Tobacco Use  ? Smoking status: Never  ? Smokeless tobacco: Never  ?Substance Use Topics  ? Alcohol use: No  ?  Alcohol/week: 0.0 standard drinks  ? ? ? ?Current Outpatient Medications:  ?  dicyclomine (BENTYL) 10 MG capsule, Take 2 capsules (20 mg total) by mouth 4 (four) times daily -  before meals and at bedtime., Disp: 240 capsule, Rfl: 0 ?  dicyclomine (BENTYL) 20 MG tablet, Take 1 tablet (20 mg total) by mouth 3 (three) times daily as needed for spasms (abd cramping)., Disp: 60 tablet, Rfl: 0 ?  LORazepam (ATIVAN) 1 MG tablet, Take 0.5-1 tablets (0.5-1 mg total) by mouth daily as needed (severe refractory nausea and vomiting)., Disp: 10 tablet, Rfl: 0 ?  Multiple Vitamins-Minerals (WOMENS MULTIVITAMIN) TABS, Take 1 tablet by mouth daily., Disp: , Rfl:  ?  OLANZapine (ZYPREXA) 2.5 MG tablet, Take 1 tablet (2.5 mg total) by mouth at bedtime., Disp: 90 tablet, Rfl: 1 ?  ondansetron (ZOFRAN-ODT) 4 MG disintegrating tablet, Take 1-2 tablets (4-8 mg total) by mouth every 8 (eight) hours as needed for nausea, vomiting or refractory nausea / vomiting (cyclical N/V)., Disp: 40 tablet, Rfl: 1 ?  pantoprazole (PROTONIX) 40 MG tablet, Take 1 tablet (40 mg total) by mouth daily., Disp: 30 tablet, Rfl: 1 ?  promethazine (PHENERGAN) 25 MG suppository, Place 1 suppository (25 mg total) rectally every 6 (six) hours as needed for refractory nausea / vomiting., Disp: 12 each, Rfl: 0 ? ?Current Facility-Administered Medications:  ?  promethazine (PHENERGAN) injection 25 mg, 25 mg, Intramuscular, Q6H PRN, Tapia, Leisa, PA-C, 25 mg at 11/06/21 1042 ? ?Allergies  ?Allergen Reactions  ? Buspar [Buspirone]   ?  Dystonic reaction - arms, legs, jaw "locked up"  ? ? ?I personally reviewed {Reviewed:14835} with the patient/caregiver today. ? ? ?ROS ? ?*** ? ?Objective ? ?There were no vitals filed for this visit. ? ?There is no height or weight on file to calculate BMI. ? ?Physical  Exam ?*** ? ?Recent Results (from the past 2160 hour(s))  ?Urinalysis, Routine w reflex microscopic     Status: Abnormal  ? Collection Time: 10/27/21  7:48 AM  ?Result Value Ref Range  ? Color, Urine STRAW (A) YELLOW  ? APPearance CLEAR (A) CLEAR  ? Specific Gravity, Urine 1.010 1.005 - 1.030  ? pH 6.0 5.0 - 8.0  ? Glucose, UA NEGATIVE NEGATIVE mg/dL  ? Hgb urine dipstick NEGATIVE NEGATIVE  ? Bilirubin Urine NEGATIVE NEGATIVE  ? Ketones, ur NEGATIVE NEGATIVE mg/dL  ? Protein, ur NEGATIVE NEGATIVE mg/dL  ? Nitrite NEGATIVE NEGATIVE  ? Leukocytes,Ua TRACE (A) NEGATIVE  ? RBC / HPF 0-5 0 - 5 RBC/hpf  ? WBC, UA 0-5 0 - 5 WBC/hpf  ? Bacteria, UA NONE SEEN NONE SEEN  ? Squamous Epithelial / LPF 6-10 0 - 5  ? Mucus PRESENT   ?  Comment: Performed at [facility], [address], [place], [place] [address]  ?Pregnancy, urine     Status: None  ? Collection Time: 10/27/21  7:48 AM  ?Result Value Ref Range  ? Preg Test, Ur NEGATIVE NEGATIVE  ?  Comment: Performed at [facility], [address], [place], [place] [address]  ?Comprehensive metabolic panel     Status: Abnormal  ? Collection Time: 10/27/21  7:48 AM  ?Result Value Ref Range  ? Sodium 138 135 - 145 mmol/L  ? Potassium 3.6 3.5 - 5.1 mmol/L  ? Chloride 104 98 - 111 mmol/L  ? CO2 28 22 - 32 mmol/L  ? Glucose, Bld 91 70 - 99 mg/dL  ?  Comment: Glucose reference range applies only to samples taken after fasting for at least 8 hours.  ? BUN 20 6 - 20 mg/dL  ? Creatinine, Ser 0.77 0.44 - 1.00 mg/dL  ? Calcium 8.8 (L) 8.9 - 10.3 mg/dL  ? Total Protein 7.8 6.5 - 8.1 g/dL  ? Albumin 4.3 3.5 - 5.0 g/dL  ? AST 25 15 - 41 U/L  ? ALT 18 0 - 44 U/L  ? Alkaline Phosphatase 61 38 - 126 U/L  ? Total Bilirubin 0.6 0.3 - 1.2 mg/dL  ? GFR, Estimated >60 >60 mL/min  ?  Comment: (NOTE) ?Calculated using the CKD-EPI Creatinine Equation (2021) ?  ? Anion gap 6 5 - 15  ?  Comment: Performed at [facility], [address] [place], [place] [address]  ?Ethanol      Status: None  ? Collection Time: 10/27/21  7:48 AM  ?Result Value Ref Range  ? Alcohol, Ethyl (B) <10 <10 mg/dL  ?  Comment: (NOTE) ?Lowest detectable limit for serum alcohol is 10 mg/dL. ? ?For medical purposes o

## 2021-11-08 NOTE — Progress Notes (Signed)
? ? ?Patient ID: Tiffany Guzman, female    DOB: 23-Aug-1995, 26 y.o.   MRN: [id number] ? ?PCP: [name], PA-C ? ?Chief Complaint  ?Patient presents with  ? Follow-up  ?  Cannabinoid hyperemesis syndrome  ? ? ?Subjective:  ? ?Tiffany Guzman is a 26 y.o. female, presents to clinic with CC of the following: ? ?HPI  ?Patient is here for follow-up of intractable nausea and vomiting with suspected history of cannabinoid hyperemesis syndrome ?She was able to get Bentyl, Phenergan, Ativan and her pharmacy was out of the Zofran.  She has continued to abstain from the cannabinoid products, she is feeling much better today she no longer has any tremors, muscle contractures, crampy abdominal pain and she has been able to tolerate liquids and solids ?She does feel like her anxiety is much better with trying Ativan.  She states that she has a history of anxiety and she may be self-medicating with the marijuana products to help manage that and it just spiraled out of control causing all of these abdominal symptoms.  She reports not ever previously seeing psychiatry or talking to a therapist.  She states that she tried buspirone in the past but had a serious allergic reaction to it.  She may have tried another SSRI sometime in the past and currently is only on olanzapine for anxiety ? ? ?  11/06/2021  ?  9:43 AM 10/11/2021  ?  3:06 PM 08/20/2021  ?  8:21 AM  ?Depression screen PHQ 2/9  ?Decreased Interest 0 0 0  ?Down, Depressed, Hopeless 0 1 0  ?PHQ - 2 Score 0 1 0  ?Altered sleeping 0    ?Tired, decreased energy 0    ?Change in appetite 0    ?Feeling bad or failure about yourself  0    ?Trouble concentrating 0    ?Moving slowly or fidgety/restless 0    ?Suicidal thoughts 0    ?PHQ-9 Score 0    ?Difficult doing work/chores Not difficult at all    ? ? ?  11/08/2021  ?  2:19 PM 05/30/2021  ? 10:17 AM 03/15/2021  ?  1:53 PM 10/06/2019  ? 10:16 AM  ?GAD 7 : Generalized Anxiety Score  ?Nervous, Anxious, on Edge 2 1 0 2   ?Control/stop worrying 3 0 0 3  ?Worry too much - different things 3 0 0 3  ?Trouble relaxing 2 1 0 2  ?Restless 1 0 0 1  ?Easily annoyed or irritable 2 1 0 2  ?Afraid - awful might happen 3 0 0 2  ?Total GAD 7 Score 16 3 0 15  ?Anxiety Difficulty  Not difficult at all Not difficult at all Not difficult at all  ? ? ? ? ?Patient Active Problem List  ? Diagnosis Date Noted  ? IBS (irritable bowel syndrome) 03/15/2021  ? 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 Bradycardia 02/26/2021  ? Right ovarian cyst 02/25/2021  ? Leukocytosis 02/25/2021  ? 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 S/P cholecystectomy   ? Status post cesarean delivery 11/14/2017  ? Dermoid cyst 10/03/2017  ? Dermoid cyst of left ovary 10/01/2017  ? Anxiety 06/24/2016  ? ? ? ? ?Current Outpatient Medications:  ?  dicyclomine (BENTYL) 10 MG capsule, Take 2 capsules (20 mg total) by mouth 4 (four) times daily -  before meals and at bedtime., Disp: 240 capsule, Rfl: 0 ?  dicyclomine (BENTYL) 20 MG tablet, Take 1 tablet (20 mg total) by mouth 3 (  three) times daily as needed for spasms (abd cramping)., Disp: 60 tablet, Rfl: 0 ?  LORazepam (ATIVAN) 1 MG tablet, Take 0.5-1 tablets (0.5-1 mg total) by mouth daily as needed (severe refractory nausea and vomiting)., Disp: 10 tablet, Rfl: 0 ?  Multiple Vitamins-Minerals (WOMENS MULTIVITAMIN) TABS, Take 1 tablet by mouth daily., Disp: , Rfl:  ?  OLANZapine (ZYPREXA) 2.5 MG tablet, Take 1 tablet (2.5 mg total) by mouth at bedtime., Disp: 90 tablet, Rfl: 1 ?  pantoprazole (PROTONIX) 40 MG tablet, Take 1 tablet (40 mg total) by mouth daily., Disp: 30 tablet, Rfl: 1 ?  promethazine (PHENERGAN) 25 MG suppository, Place 1 suppository (25 mg total) rectally every 6 (six) hours as needed for refractory nausea / vomiting., Disp: 12 each, Rfl: 0 ?  ondansetron (ZOFRAN-ODT) 4 MG disintegrating tablet, Take 1-2 tablets (4-8 mg total) by mouth every 8 (eight) hours as needed for nausea, vomiting or refractory nausea /  vomiting (cyclical N/V)., Disp: 40 tablet, Rfl: 1 ? ?Current Facility-Administered Medications:  ?  promethazine (PHENERGAN) injection 25 mg, 25 mg, Intramuscular, Q6H PRN, Tatym Schermer, PA-C, 25 mg at 11/06/21 1042 ? ? ?Allergies  ?Allergen Reactions  ? Buspirone Anaphylaxis and Other (See Comments)  ?  Dystonic reaction - arms, legs, jaw "locked up" ?"lockjaw" ?  ? ? ? ?Social History  ? ?Tobacco Use  ? Smoking status: Never  ? Smokeless tobacco: Never  ?Vaping Use  ? Vaping Use: Every day  ? Start date: 09/20/2019  ? Substances: Nicotine, Flavoring  ?Substance Use Topics  ? Alcohol use: No  ?  Alcohol/week: 0.0 standard drinks  ? Drug use: Yes  ?  Frequency: 7.0 times per week  ?  Types: Marijuana  ?  Comment: has stopped for 10 days  ?  ? ? ?Chart Review Today: ?I personally reviewed active problem list, medication list, allergies, family history, social history, health maintenance, notes from last encounter, lab results, imaging with the patient/caregiver today. ? ? ?Review of Systems  ?Constitutional: Negative.   ?HENT: Negative.    ?Eyes: Negative.   ?Respiratory: Negative.    ?Cardiovascular: Negative.   ?Gastrointestinal: Negative.   ?Endocrine: Negative.   ?Genitourinary: Negative.   ?Musculoskeletal: Negative.   ?Skin: Negative.   ?Allergic/Immunologic: Negative.   ?Neurological: Negative.   ?Hematological: Negative.   ?Psychiatric/Behavioral: Negative.    ?All other systems reviewed and are negative. ? ?   ?Objective:  ? ?Vitals:  ? 11/08/21 1125  ?BP: 106/72  ?Pulse: 100  ?Resp: 14  ?Temp: 97.7 ?F (36.5 ?C)  ?TempSrc: Oral  ?SpO2: 99%  ?Weight: 111 lb 4.8 oz (50.5 kg)  ?Height: '5\' 4"'$  (1.626 m)  ?  ?Body mass index is 19.1 kg/m?. ? ?Physical Exam ?Vitals and nursing note reviewed.  ?Constitutional:   ?   General: She is not in acute distress. ?   Appearance: Normal appearance. She is well-developed. She is not ill-appearing, toxic-appearing or diaphoretic.  ?HENT:  ?   Head: Normocephalic and atraumatic.   ?   Right Ear: External ear normal.  ?   Left Ear: External ear normal.  ?   Nose: Nose normal.  ?Eyes:  ?   General:     ?   Right eye: No discharge.     ?   Left eye: No discharge.  ?   Conjunctiva/sclera: Conjunctivae normal.  ?Neck:  ?   Trachea: No tracheal deviation.  ?Cardiovascular:  ?   Rate and Rhythm: Normal rate and regular rhythm.  ?  Pulses: Normal pulses.  ?   Heart sounds: Normal heart sounds. No murmur heard. ?  No friction rub. No gallop.  ?Pulmonary:  ?   Effort: Pulmonary effort is normal. No respiratory distress.  ?   Breath sounds: Normal breath sounds. No stridor.  ?Abdominal:  ?   General: Bowel sounds are normal.  ?   Palpations: Abdomen is soft.  ?Musculoskeletal:     ?   General: Normal range of motion.  ?Skin: ?   General: Skin is warm and dry.  ?   Coloration: Skin is not jaundiced or pale.  ?   Findings: No rash.  ?Neurological:  ?   Mental Status: She is alert. Mental status is at baseline.  ?   Motor: No abnormal muscle tone.  ?   Coordination: Coordination normal.  ?Psychiatric:     ?   Mood and Affect: Mood normal.     ?   Behavior: Behavior normal.  ?  ? ?Results for orders placed or performed during the hospital encounter of 11/04/21  ?Comprehensive metabolic panel  ?Result Value Ref Range  ? Sodium 136 135 - 145 mmol/L  ? Potassium 3.6 3.5 - 5.1 mmol/L  ? Chloride 105 98 - 111 mmol/L  ? CO2 22 22 - 32 mmol/L  ? Glucose, Bld 95 70 - 99 mg/dL  ? BUN 16 6 - 20 mg/dL  ? Creatinine, Ser 0.71 0.44 - 1.00 mg/dL  ? Calcium 8.5 (L) 8.9 - 10.3 mg/dL  ? Total Protein 7.2 6.5 - 8.1 g/dL  ? Albumin 4.1 3.5 - 5.0 g/dL  ? AST 30 15 - 41 U/L  ? ALT 21 0 - 44 U/L  ? Alkaline Phosphatase 46 38 - 126 U/L  ? Total Bilirubin 0.8 0.3 - 1.2 mg/dL  ? GFR, Estimated >60 >60 mL/min  ? Anion gap 9 5 - 15  ?CBC with Differential  ?Result Value Ref Range  ? WBC 9.7 4.0 - 10.5 K/uL  ? RBC 4.11 3.87 - 5.11 MIL/uL  ? Hemoglobin 11.3 (L) 12.0 - 15.0 g/dL  ? HCT 35.0 (L) 36.0 - 46.0 %  ? MCV 85.2 80.0 - 100.0  fL  ? MCH 27.5 26.0 - 34.0 pg  ? MCHC 32.3 30.0 - 36.0 g/dL  ? RDW 13.1 11.5 - 15.5 %  ? Platelets 293 150 - 400 K/uL  ? nRBC 0.0 0.0 - 0.2 %  ? Neutrophils Relative % 82 %  ? Neutro Abs 8.0 (H) 1.7 - 7.7 K/uL  ? Ly

## 2021-11-09 ENCOUNTER — Other Ambulatory Visit: Payer: Self-pay

## 2021-11-09 DIAGNOSIS — R112 Nausea with vomiting, unspecified: Secondary | ICD-10-CM

## 2021-11-09 LAB — CBC WITH DIFFERENTIAL/PLATELET
Absolute Monocytes: 851 cells/uL (ref 200–950)
Basophils Absolute: 35 cells/uL (ref 0–200)
Basophils Relative: 0.3 %
Eosinophils Absolute: 69 cells/uL (ref 15–500)
Eosinophils Relative: 0.6 %
HCT: 44.1 % (ref 35.0–45.0)
Hemoglobin: 14.7 g/dL (ref 11.7–15.5)
Lymphs Abs: 2576 cells/uL (ref 850–3900)
MCH: 28.4 pg (ref 27.0–33.0)
MCHC: 33.3 g/dL (ref 32.0–36.0)
MCV: 85.1 fL (ref 80.0–100.0)
MPV: 10.7 fL (ref 7.5–12.5)
Monocytes Relative: 7.4 %
Neutro Abs: 7970 cells/uL — ABNORMAL HIGH (ref 1500–7800)
Neutrophils Relative %: 69.3 %
Platelets: 356 10*3/uL (ref 140–400)
RBC: 5.18 10*6/uL — ABNORMAL HIGH (ref 3.80–5.10)
RDW: 12.8 % (ref 11.0–15.0)
Total Lymphocyte: 22.4 %
WBC: 11.5 10*3/uL — ABNORMAL HIGH (ref 3.8–10.8)

## 2021-11-09 LAB — COMPLETE METABOLIC PANEL WITH GFR
AG Ratio: 1.7 (calc) (ref 1.0–2.5)
ALT: 17 U/L (ref 6–29)
AST: 14 U/L (ref 10–30)
Albumin: 4.9 g/dL (ref 3.6–5.1)
Alkaline phosphatase (APISO): 61 U/L (ref 31–125)
BUN: 21 mg/dL (ref 7–25)
CO2: 26 mmol/L (ref 20–32)
Calcium: 9.6 mg/dL (ref 8.6–10.2)
Chloride: 100 mmol/L (ref 98–110)
Creat: 0.82 mg/dL (ref 0.50–0.96)
Globulin: 2.9 g/dL (calc) (ref 1.9–3.7)
Glucose, Bld: 78 mg/dL (ref 65–99)
Potassium: 4.1 mmol/L (ref 3.5–5.3)
Sodium: 139 mmol/L (ref 135–146)
Total Bilirubin: 1 mg/dL (ref 0.2–1.2)
Total Protein: 7.8 g/dL (ref 6.1–8.1)
eGFR: 102 mL/min/{1.73_m2} (ref >=60)

## 2021-11-09 LAB — MAGNESIUM: Magnesium: 2.1 mg/dL (ref 1.5–2.5)

## 2021-11-09 MED ORDER — ONDANSETRON 4 MG PO TBDP: 4.0000 mg | ORAL_TABLET | Freq: Three times a day (TID) | ORAL | 1 refills | Status: DC | PRN

## 2021-11-12 NOTE — Telephone Encounter (Signed)
Visit with PCP completed on 11/06/2021 ?

## 2021-11-13 ENCOUNTER — Other Ambulatory Visit: Payer: Self-pay | Admitting: *Deleted

## 2021-11-13 NOTE — Patient Instructions (Signed)
Visit Information ? ?Ms. Loughmiller was given information about Medicaid Managed Care team care coordination services as a part of their Healthy [employer] Medicaid benefit. Rashan Lon Colaizzi verbally consented to engagement with the [facility] Managed Care team.  ? ?If you are experiencing a medical emergency, please call 911 or report to your local emergency department or urgent care.  ? ?If you have a non-emergency medical problem during routine business hours, please contact your provider's office and ask to speak with a nurse.  ? ?For questions related to your Healthy [facility] health plan, please call: [phone] or visit the homepage here: [email or web address] ? ?If you would like to schedule transportation through your Healthy [facility] plan, please call the following number at least 2 days in advance of your appointment: [phone] ? For information about your ride after you set it up, call Ride Assist at [phone]. Use this number to activate a Will Call pickup, or if your transportation is late for a scheduled pickup. Use this number, too, if you need to make a change or cancel a previously scheduled reservation. ? If you need transportation services right away, call [phone]. The after-hours call center is staffed 24 hours to handle ride assistance and urgent reservation requests (including discharges) 365 days a year. Urgent trips include sick visits, hospital discharge requests and life-sustaining treatment. ? ?Call the [facility] at [phone], at any time, 24 hours a day, 7 days a week. If you are in danger or need immediate medical attention call 911. ? ?If you would like help to quit smoking, call 1-800-QUIT-NOW 914-853-0129) OR Espa?ol: 1-855-D?jelo-Ya 4702725237) o para m?s informaci?n haga clic aqu? or Text READY to 200-400 to register via text ? ?Ms. Drue Flirt, ? ? ?Please see education materials related to ovarian cyst  and IBS provided by MyChart link. ? ?Patient verbalizes understanding of instructions and care plan provided today and agrees to view in [place]. Active MyChart status confirmed with patient.   ? ?Telephone follow up appointment with Managed Medicaid care management team member scheduled for:01/14/22 @ 3:30pm ? ?Lurena Joiner RN, BSN ?[place] ?RN Care Coordinator ? ? ?Following is a copy of your plan of care:  ?Care Plan : [place] of Care  ?Updates made by [name], RN since 11/13/2021 12:00 AM  ?  ? ?Problem: Health Management needs related to IBS and anxiety   ?  ? ?Long-Range Goal: Development of Plan of Care to address Health Management needs related to IBS and anxiety   ?Start Date: 10/05/2021  ?Expected End Date: 01/03/2022  ?Recent Progress: On track  ?Priority: High  ?Note:   ?Current Barriers:  ?Chronic Disease Management support and education needs related to IBS and Anxiety ? ?RNCM Clinical Goal(s):  ?Patient will verbalize understanding of plan for management of Anxiety and IBS as evidenced by patient verbalization of self monitoring activities ?take all medications exactly as prescribed and will call provider for medication related questions as evidenced by documentation in EMR    ?work with Education officer, museum to address [place] Concerns  related to the management of Anxiety as evidenced by review of EMR and patient or social worker report     through collaboration with Consulting civil engineer, provider, and care team.  ? ?Interventions: ?Inter-disciplinary care team collaboration (see longitudinal plan of care) ?Evaluation of current treatment plan related to  self management and patient's adherence to plan as established by provider ? ? ?IBS  (  Status: Goal on Track (progressing): YES.) Long Term Goal  ?Evaluation of current treatment plan related to  IBS ,  self-management and patient's adherence to plan as established by provider. ?Discussed plans with patient  for ongoing care management follow up and provided patient with direct contact information for care management team ?Advised patient to Schedule follow up with PCP to discuss medications; ?Provided education to patient re: IBS foods to eat and foods to avoid; ?Reviewed medications with patient and discussed patient concerns, advised patient to send a MyChart message to PCP for clarification ; ?Reviewed scheduled/upcoming provider appointments including 11/19/21 GYN visit; ?Discussed plans with patient for ongoing care management follow up and provided patient with direct contact information for care management team; ?Discussed healthy diet, including cutting down on soda, increase water intake, increase fruits and vegetables and cutting down on eating out  ? ? ?Anxiety  (Status:  Goal Met.)  Short Term Goal ?Evaluation of current treatment plan related to Anxiety,  self-management and patient's adherence to plan as established by provider. ?Discussed plans with patient for ongoing care management follow up and provided patient with direct contact information for care management team ?Collaborated with Jerene Pitch, [place] regarding managing anxiety, scheduled patient to meet with Jerene Pitch on 10/11/21 @ 3pm ?Discussed plans with patient for ongoing care management follow up and provided patient with direct contact information for care management team ?Provided education re: managing anxiety ? ?Patient Goals/Self-Care Activities: ?Take medications as prescribed   ?Attend all scheduled provider appointments ?Call provider office for new concerns or questions  ?Work with the Education officer, museum to address care coordination needs and will continue to work with the clinical team to address health care and disease management related needs ?go to [facility] [place] [phone]) if experiencing a Mental Health or [place]  ?Improve diet as appropriate for IBS ? ? ?  ? ?   ?

## 2021-11-13 NOTE — Patient Outreach (Signed)
?Medicaid Managed Care   ?Nurse Care Manager Note ? ?11/13/2021 ?Name:  Tiffany Guzman MRN:  [id number] DOB:  [birth year] ? ?Tiffany Guzman is an 26 y.o. year old female who is a primary patient of Tiffany Guzman, [place].  [facility] Managed Care Coordination team was consulted for assistance with:    ?Anxiety ?IBS ? ?[name] was given information about Medicaid Managed Care Coordination team services today. [place] Patient agreed to services and verbal consent obtained. ? ?Engaged with patient by telephone for follow up visit in response to provider referral for case management and/or care coordination services.  ? ?Assessments/Interventions:  Review of past medical history, allergies, medications, health status, including review of consultants reports, laboratory and other test data, was performed as part of comprehensive evaluation and provision of chronic care management services. ? ?SDOH (Social Determinants of Health) assessments and interventions performed: ?SDOH Interventions   ? ?Flowsheet Row Most Recent Value  ?SDOH Interventions   ?Physical Activity Interventions Intervention Not Indicated  ? ?  ? ? ?Care Plan ? ?Allergies  ?Allergen Reactions  ? Buspirone Anaphylaxis and Other (See Comments)  ?  Dystonic reaction - arms, legs, jaw "locked up" ?"lockjaw" ?  ? ? ?Medications Reviewed Today   ? ? Reviewed by [name], RN (Registered Nurse) on 11/13/21 at 1443  Med List Status: <None>  ? ?Medication Order Taking? Sig Documenting Provider Last Dose Status Informant  ?dicyclomine (BENTYL) 10 MG capsule [id number] No Take 2 capsules (20 mg total) by mouth 4 (four) times daily -  before meals and at bedtime.  ?Patient not taking: Reported on 11/13/2021  ? Steele Sizer, MD Not Taking Active   ?dicyclomine (BENTYL) 20 MG tablet [id number] Yes Take 1 tablet (20 mg total) by mouth 3 (three) times daily as needed for spasms (abd cramping). Tiffany Grana, PA-C Taking Active   ?LORazepam  (ATIVAN) 1 MG tablet [id number] Yes Take 0.5-1 tablets (0.5-1 mg total) by mouth daily as needed (severe refractory nausea and vomiting). Tiffany Grana, PA-C Taking Active   ?Multiple Vitamins-Minerals (WOMENS MULTIVITAMIN) TABS [id number] Yes Take 1 tablet by mouth daily. [provider] Taking Active Multiple Informants  ?OLANZapine (ZYPREXA) 2.5 MG tablet [id number] No Take 1 tablet (2.5 mg total) by mouth at bedtime.  ?Patient not taking: Reported on 11/13/2021  ? [name], [name], PA-C Not Taking Active   ?ondansetron (ZOFRAN-ODT) 4 MG disintegrating tablet [id number] Yes Take 1-2 tablets (4-8 mg total) by mouth every 8 (eight) hours as needed for nausea, vomiting or refractory nausea / vomiting (cyclical N/V). Tiffany Grana, PA-C Taking Active   ?pantoprazole (PROTONIX) 40 MG tablet [id number] No Take 1 tablet (40 mg total) by mouth daily.  ?Patient not taking: Reported on 11/13/2021  ? Will Bonnet, MD Not Taking Active   ?promethazine (PHENERGAN) 25 MG suppository [id number] Yes Place 1 suppository (25 mg total) rectally every 6 (six) hours as needed for refractory nausea / vomiting. Tiffany Grana, PA-C Taking Active   ?promethazine [facility]) injection 25 mg [id number]   Tiffany Grana, PA-C  Active   ?sertraline (ZOLOFT) 50 MG tablet [id number] No Take 1/2 tab (25 mg) po daily at bedtime for 7 d, then take 1 tab (50 mg) po daily at bedtime  ?Patient not taking: Reported on 11/13/2021  ? Tiffany Grana, PA-C Not Taking Active   ?         ?Med Note ([name] A   Tue Nov 13, 2021  2:38 PM) Has  not started taking  ? ?  ?  ? ?  ? ? ?Patient Active Problem List  ? Diagnosis Date Noted  ? IBS (irritable bowel syndrome) 03/15/2021  ? 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 Bradycardia 02/26/2021  ? Right ovarian cyst 02/25/2021  ? Leukocytosis 02/25/2021  ? 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 S/P cholecystectomy   ? Status post cesarean delivery 11/14/2017  ? Dermoid cyst 10/03/2017  ? Dermoid cyst of left  ovary 10/01/2017  ? Anxiety 06/24/2016  ? ? ?Conditions to be addressed/monitored per PCP order:  Anxiety and IBS ? ?Care Plan : RN Care Manager Plan of Care  ?Updates made by [name], RN since 11/13/2021 12:00 AM  ?  ? ?Problem: Health Management needs related to IBS and anxiety   ?  ? ?Long-Range Goal: Development of Plan of Care to address Health Management needs related to IBS and anxiety   ?Start Date: 10/05/2021  ?Expected End Date: 01/03/2022  ?Recent Progress: On track  ?Priority: High  ?Note:   ?Current Barriers:  ?Chronic Disease Management support and education needs related to IBS and Anxiety ? ?RNCM Clinical Goal(s):  ?Patient will verbalize understanding of plan for management of Anxiety and IBS as evidenced by patient verbalization of self monitoring activities ?take all medications exactly as prescribed and will call provider for medication related questions as evidenced by documentation in EMR    ?work with Education officer, museum to address [name] Concerns  related to the management of Anxiety as evidenced by review of EMR and patient or social worker report     through collaboration with Consulting civil engineer, provider, and care team.  ? ?Interventions: ?Inter-disciplinary care team collaboration (see longitudinal plan of care) ?Evaluation of current treatment plan related to  self management and patient's adherence to plan as established by provider ? ? ?IBS  (Status: Goal on Track (progressing): YES.) Long Term Goal  ?Evaluation of current treatment plan related to  IBS ,  self-management and patient's adherence to plan as established by provider. ?Discussed plans with patient for ongoing care management follow up and provided patient with direct contact information for care management team ?Advised patient to Schedule follow up with PCP to discuss medications; ?Provided education to patient re: IBS foods to eat and foods to avoid; ?Reviewed medications with patient and discussed patient concerns,  advised patient to send a MyChart message to PCP for clarification ; ?Reviewed scheduled/upcoming provider appointments including 11/19/21 GYN visit; ?Discussed plans with patient for ongoing care management follow up and provided patient with direct contact information for care management team; ?Discussed healthy diet, including cutting down on soda, increase water intake, increase fruits and vegetables and cutting down on eating out  ? ? ?Anxiety  (Status:  Goal Met.)  Short Term Goal ?Evaluation of current treatment plan related to Anxiety,  self-management and patient's adherence to plan as established by provider. ?Discussed plans with patient for ongoing care management follow up and provided patient with direct contact information for care management team ?Collaborated with Jerene Pitch, [place] regarding managing anxiety, scheduled patient to meet with Jerene Pitch on 10/11/21 @ 3pm ?Discussed plans with patient for ongoing care management follow up and provided patient with direct contact information for care management team ?Provided education re: managing anxiety ? ?Patient Goals/Self-Care Activities: ?Take medications as prescribed   ?Attend all scheduled provider appointments ?Call provider office for new concerns or questions  ?Work with the Education officer, museum to address care coordination needs and will  continue to work with the clinical team to address health care and disease management related needs ?go to [facility] [place] [phone]) if experiencing a Mental Health or [place]  ?Improve diet as appropriate for IBS ? ? ?  ? ? ?Follow Up:  Patient agrees to Care Plan and Follow-up. ? ?Plan: The Managed Medicaid care management team will reach out to the patient again over the next 60 days. ? ?Date/time of next scheduled RN care management/care coordination outreach:  01/14/22 @ 3:30pm ? ?Lurena Joiner RN, BSN ?[place] ?RN Care Coordinator ? ?

## 2021-11-28 ENCOUNTER — Ambulatory Visit (INDEPENDENT_AMBULATORY_CARE_PROVIDER_SITE_OTHER): Payer: Medicaid Other | Admitting: Family Medicine

## 2021-11-28 ENCOUNTER — Encounter: Payer: Self-pay | Admitting: Family Medicine

## 2021-11-28 VITALS — BP 108/72 | HR 98 | Temp 98.2°F | Resp 14 | Ht 64.0 in | Wt 115.2 lb

## 2021-11-28 DIAGNOSIS — R112 Nausea with vomiting, unspecified: Secondary | ICD-10-CM

## 2021-11-28 DIAGNOSIS — F321 Major depressive disorder, single episode, moderate: Secondary | ICD-10-CM | POA: Diagnosis not present

## 2021-11-28 DIAGNOSIS — F1290 Cannabis use, unspecified, uncomplicated: Secondary | ICD-10-CM

## 2021-11-28 DIAGNOSIS — F419 Anxiety disorder, unspecified: Secondary | ICD-10-CM

## 2021-11-28 DIAGNOSIS — F12188 Cannabis abuse with other cannabis-induced disorder: Secondary | ICD-10-CM | POA: Insufficient documentation

## 2021-11-28 DIAGNOSIS — R519 Headache, unspecified: Secondary | ICD-10-CM

## 2021-11-28 MED ORDER — PAROXETINE HCL 10 MG PO TABS: 10.0000 mg | ORAL_TABLET | Freq: Every day | ORAL | 1 refills | Status: DC

## 2021-11-28 NOTE — Patient Instructions (Signed)
SSRI class can have side effects such as weight gain, sexual dysfunction, insomnia, headache, nausea. These medications are generally effective at alleviating symptoms of anxiety and/or depression.  ?Let me know if significant side effects do occur. ? ? ? ?

## 2021-11-28 NOTE — Progress Notes (Signed)
? ? ?Patient ID: Tiffany Guzman, female    DOB: September 30, 1995, 26 y.o.   MRN: [id number] ? ?PCP: [name], PA-C ? ?Chief Complaint  ?Patient presents with  ? Depression  ? Anxiety  ? ? ?Subjective:  ? ?Tiffany Guzman is a 26 y.o. female, presents to clinic with CC of the following: ? ?HPI  ?She presents for f/up on anxiety/mdd and med check ?She did not start zoloft, was very hesitant to do so -she discussed with her mom and sister who are both taking Paxil and she feels most comfortable with trying that medication first.  She has not reached out to the psychiatry offices for an appointment ?She did have some concerning symptoms feeling slightly shaky, almost feeling drunk but while she was sober it happened for a day and then resolved for a day the seem to happen after stopping all of her medications but has not occurred for the past 3 days. ? ?She does not feel down or depressed PHQ-9 is negative reviewed today ? ?  11/28/2021  ?  3:09 PM 11/06/2021  ?  9:43 AM 10/11/2021  ?  3:06 PM  ?Depression screen PHQ 2/9  ?Decreased Interest 0 0 0  ?Down, Depressed, Hopeless 0 0 1  ?PHQ - 2 Score 0 0 1  ?Altered sleeping 0 0   ?Tired, decreased energy 1 0   ?Change in appetite 1 0   ?Feeling bad or failure about yourself  0 0   ?Trouble concentrating 1 0   ?Moving slowly or fidgety/restless 0 0   ?Suicidal thoughts 0 0   ?PHQ-9 Score 3 0   ?Difficult doing work/chores Not difficult at all Not difficult at all   ? ? ?  11/28/2021  ?  3:09 PM 11/08/2021  ?  2:19 PM 05/30/2021  ? 10:17 AM 03/15/2021  ?  1:53 PM  ?GAD 7 : Generalized Anxiety Score  ?Nervous, Anxious, on Edge _0 0  ?Control/stop worrying 2 3 0 0  ?Worry too much - different things 2 3 0 0  ?Trouble relaxing _1 0  ?Restless 0 1 0 0  ?Easily annoyed or irritable _2 0  ?Afraid - awful might happen 2 3 0 0  ?Total GAD 7 Score _3 0  ?Anxiety Difficulty Somewhat difficult  Not difficult at all Not difficult at all  ? ? ?She has continued to abstain  from the delta 8 cannabinoid products ?Most bothersome anxiety symptoms is still feeling anxious, worrying/concerned ? ? ?Patient Active Problem List  ? Diagnosis Date Noted  ? IBS (irritable bowel syndrome) 03/15/2021  ? 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 Bradycardia 02/26/2021  ? Right ovarian cyst 02/25/2021  ? Leukocytosis 02/25/2021  ? 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 S/P cholecystectomy   ? Status post cesarean delivery 11/14/2017  ? Dermoid cyst 10/03/2017  ? Dermoid cyst of left ovary 10/01/2017  ? Anxiety 06/24/2016  ? ? ? ? ?Current Outpatient Medications:  ?  dicyclomine (BENTYL) 20 MG tablet, Take 1 tablet (20 mg total) by mouth 3 (three) times daily as needed for spasms (abd cramping)., Disp: 60 tablet, Rfl: 0 ?  Multiple Vitamin (MULTI-VITAMIN) tablet, Take 1 tablet by mouth daily., Disp: , Rfl:  ?  promethazine (PHENERGAN) 25 MG suppository, Place 1 suppository (25 mg total) rectally every 6 (six) hours as needed for refractory nausea / vomiting., Disp: 12 each, Rfl: 0 ?  dicyclomine (BENTYL) 10 MG  capsule, Take 2 capsules (20 mg total) by mouth 4 (four) times daily -  before meals and at bedtime., Disp: 240 capsule, Rfl: 0 ?  LORazepam (ATIVAN) 1 MG tablet, Take 0.5-1 tablets (0.5-1 mg total) by mouth daily as needed (severe refractory nausea and vomiting). (Patient not taking: Reported on 11/28/2021), Disp: 10 tablet, Rfl: 0 ?  OLANZapine (ZYPREXA) 2.5 MG tablet, Take 1 tablet (2.5 mg total) by mouth at bedtime. (Patient not taking: Reported on 11/13/2021), Disp: 90 tablet, Rfl: 1 ?  ondansetron (ZOFRAN-ODT) 4 MG disintegrating tablet, Take 1-2 tablets (4-8 mg total) by mouth every 8 (eight) hours as needed for nausea, vomiting or refractory nausea / vomiting (cyclical N/V). (Patient not taking: Reported on 11/28/2021), Disp: 40 tablet, Rfl: 1 ?  pantoprazole (PROTONIX) 40 MG tablet, Take 1 tablet (40 mg total) by mouth daily., Disp: 30 tablet, Rfl: 1 ?  sertraline (ZOLOFT) 50 MG  tablet, Take 1/2 tab (25 mg) po daily at bedtime for 7 d, then take 1 tab (50 mg) po daily at bedtime (Patient not taking: Reported on 11/13/2021), Disp: 90 tablet, Rfl: 3 ? ?Current Facility-Administered Medications:  ?  promethazine (PHENERGAN) injection 25 mg, 25 mg, Intramuscular, Q6H PRN, Dortha Neighbors, PA-C, 25 mg at 11/06/21 1042 ? ? ?Allergies  ?Allergen Reactions  ? Buspirone Anaphylaxis and Other (See Comments)  ?  Dystonic reaction - arms, legs, jaw "locked up" ?"lockjaw" ?  ? ? ? ?Social History  ? ?Tobacco Use  ? Smoking status: Never  ? Smokeless tobacco: Never  ?Vaping Use  ? Vaping Use: Every day  ? Start date: 09/20/2019  ? Substances: Nicotine, Flavoring  ?Substance Use Topics  ? Alcohol use: No  ?  Alcohol/week: 0.0 standard drinks  ? Drug use: Yes  ?  Frequency: 7.0 times per week  ?  Types: Marijuana  ?  Comment: has stopped for 10 days  ?  ? ? ?Chart Review Today: ?I personally reviewed active problem list, medication list, allergies, family history, social history, health maintenance, notes from last encounter, lab results, imaging with the patient/caregiver today. ? ? ?Review of Systems  ?Constitutional: Negative.   ?HENT: Negative.    ?Eyes: Negative.   ?Respiratory: Negative.    ?Cardiovascular: Negative.   ?Gastrointestinal: Negative.   ?Endocrine: Negative.   ?Genitourinary: Negative.   ?Musculoskeletal: Negative.   ?Skin: Negative.   ?Allergic/Immunologic: Negative.   ?Neurological: Negative.   ?Hematological: Negative.   ?Psychiatric/Behavioral: Negative.    ?All other systems reviewed and are negative. ? ?   ?Objective:  ? ?Vitals:  ? 11/28/21 1500  ?BP: 110/72  ?Pulse: 98  ?Resp: 14  ?Temp: 98.2 ?F (36.8 ?C)  ?TempSrc: Oral  ?SpO2: 100%  ?Weight: 115 lb 3.2 oz (52.3 kg)  ?Height: $RemoveB'5\' 4"'jWxQMRpD$  (1.626 m)  ?  ?Body mass index is 19.77 kg/m?. ? ?Physical Exam ?Vitals and nursing note reviewed.  ?Constitutional:   ?   General: She is not in acute distress. ?   Appearance: Normal appearance. She is  well-developed. She is not ill-appearing, toxic-appearing or diaphoretic.  ?HENT:  ?   Head: Normocephalic and atraumatic.  ?   Right Ear: External ear normal.  ?   Left Ear: External ear normal.  ?Eyes:  ?   General:     ?   Right eye: No discharge.     ?   Left eye: No discharge.  ?   Conjunctiva/sclera: Conjunctivae normal.  ?Neck:  ?   Trachea: No tracheal  deviation.  ?Cardiovascular:  ?   Rate and Rhythm: Normal rate and regular rhythm.  ?   Pulses: Normal pulses.  ?   Heart sounds: Normal heart sounds. No murmur heard. ?  No friction rub. No gallop.  ?Pulmonary:  ?   Effort: Pulmonary effort is normal. No respiratory distress.  ?   Breath sounds: Normal breath sounds. No stridor. No wheezing, rhonchi or rales.  ?Musculoskeletal:     ?   General: Normal range of motion.  ?Skin: ?   General: Skin is warm and dry.  ?   Findings: No rash.  ?Neurological:  ?   Mental Status: She is alert.  ?   Motor: No abnormal muscle tone.  ?   Coordination: Coordination normal.  ?Psychiatric:     ?   Mood and Affect: Mood normal.     ?   Behavior: Behavior normal.  ?  ? ?Results for orders placed or performed in visit on 11/06/21  ?COMPLETE METABOLIC PANEL WITH GFR  ?Result Value Ref Range  ? Glucose, Bld 78 65 - 99 mg/dL  ? BUN 21 7 - 25 mg/dL  ? Creat 0.82 0.50 - 0.96 mg/dL  ? eGFR 102 > OR = 60 mL/min/1.69m  ? BUN/Creatinine Ratio NOT APPLICABLE 6 - 22 (calc)  ? Sodium 139 135 - 146 mmol/L  ? Potassium 4.1 3.5 - 5.3 mmol/L  ? Chloride 100 98 - 110 mmol/L  ? CO2 26 20 - 32 mmol/L  ? Calcium 9.6 8.6 - 10.2 mg/dL  ? Total Protein 7.8 6.1 - 8.1 g/dL  ? Albumin 4.9 3.6 - 5.1 g/dL  ? Globulin 2.9 1.9 - 3.7 g/dL (calc)  ? AG Ratio 1.7 1.0 - 2.5 (calc)  ? Total Bilirubin 1.0 0.2 - 1.2 mg/dL  ? Alkaline phosphatase (APISO) 61 31 - 125 U/L  ? AST 14 10 - 30 U/L  ? ALT 17 6 - 29 U/L  ?CBC with Differential/Platelet  ?Result Value Ref Range  ? WBC 11.5 (H) 3.8 - 10.8 Thousand/uL  ? RBC 5.18 (H) 3.80 - 5.10 Million/uL  ? Hemoglobin 14.7  11.7 - 15.5 g/dL  ? HCT 44.1 35.0 - 45.0 %  ? MCV 85.1 80.0 - 100.0 fL  ? MCH 28.4 27.0 - 33.0 pg  ? MCHC 33.3 32.0 - 36.0 g/dL  ? RDW 12.8 11.0 - 15.0 %  ? Platelets 356 140 - 400 Thousand/uL  ? MPV 10.7 7.5 -

## 2021-12-14 ENCOUNTER — Telehealth (INDEPENDENT_AMBULATORY_CARE_PROVIDER_SITE_OTHER): Payer: Medicaid Other | Admitting: Family Medicine

## 2021-12-14 ENCOUNTER — Encounter: Payer: Self-pay | Admitting: Family Medicine

## 2021-12-14 DIAGNOSIS — F12188 Cannabis abuse with other cannabis-induced disorder: Secondary | ICD-10-CM

## 2021-12-14 DIAGNOSIS — L299 Pruritus, unspecified: Secondary | ICD-10-CM

## 2021-12-14 DIAGNOSIS — K589 Irritable bowel syndrome without diarrhea: Secondary | ICD-10-CM

## 2021-12-14 DIAGNOSIS — F419 Anxiety disorder, unspecified: Secondary | ICD-10-CM

## 2021-12-14 MED ORDER — LEVOCETIRIZINE DIHYDROCHLORIDE 5 MG PO TABS: 5.0000 mg | ORAL_TABLET | Freq: Every evening | ORAL | 2 refills | Status: DC

## 2021-12-14 NOTE — Assessment & Plan Note (Signed)
Pt indecisive about medications, but is currently having anxieties about rash/scabies that she does not yet have.  Have urged her to make a decision about taking SSRI and stick with it so it has the chance to work in body and we can see if its effective or not. She has not gone to psych I explained SSRIs would be helping manage future "flares" of mood/anxiety and take time to work.  I also reminded her that I would not prescribe ativan or other contolled substances.  Encouraged her to get est with psych and do meds now while things are more controlled so that way they are helpful in the future. Also explained how she can always get off SSRIs in the future

## 2021-12-14 NOTE — Assessment & Plan Note (Signed)
Currently sx well controlled and she was able to take a break from using bentyl

## 2021-12-14 NOTE — Assessment & Plan Note (Signed)
Still doing well refraining from use, coping mechanism for uncontrolled psych/mental health sx/dx

## 2021-12-14 NOTE — Progress Notes (Signed)
Name: Tiffany Guzman   MRN: [id number]    DOB: 1996-07-08   Date:12/14/2021       Progress Note  Subjective:    Chief Complaint  Chief Complaint  Patient presents with   Rash    Patient states it may be Scabies    I connected with  Guenevere Lon Adorno  on 12/14/21 at  8:40 AM EDT by a video enabled telemedicine application and verified that I am speaking with the correct person using two identifiers.  I discussed the limitations of evaluation and management by telemedicine and the availability of in person appointments. The patient expressed understanding and agreed to proceed. Staff also discussed with the patient that there may be a patient responsible charge related to this service. 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  Was feeling good and desiring to not be on meds She has not est with psych yet    12/14/2021    8:15 AM 11/28/2021    3:09 PM 11/06/2021    9:43 AM  Depression screen PHQ 2/9  Decreased Interest 0 0 0  Down, Depressed, Hopeless 0 0 0  PHQ - 2 Score 0 0 0  Altered sleeping 0 0 0  Tired, decreased energy 1 1 0  Change in appetite 1 1 0  Feeling bad or failure about yourself  0 0 0  Trouble concentrating 1 1 0  Moving slowly or fidgety/restless 0 0 0  Suicidal thoughts 0 0 0  PHQ-9 Score 3 3 0  Difficult doing work/chores Not difficult at all Not difficult at all Not difficult at all      Patient Active Problem List   Diagnosis Date Noted   Cannabis abuse with other cannabis-induced disorder ([place]) 11/28/2021   Current moderate episode of major depressive disorder without prior episode ([place]) 11/28/2021   IBS (irritable bowel syndrome) 03/15/2021   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   Bradycardia 02/26/2021   Right ovarian cyst 02/25/2021   Leukocytosis 02/25/2021   Status post ovarian cystectomy 02/25/2021   S/P cholecystectomy    Status post cesarean delivery 11/14/2017   Dermoid cyst 10/03/2017   Dermoid cyst of left ovary 10/01/2017   Anxiety 06/24/2016    Social History   Tobacco Use   Smoking status: Never   Smokeless tobacco: Never  Substance Use Topics   Alcohol use: No    Alcohol/week: 0.0 standard drinks     Current Outpatient Medications:    dicyclomine (BENTYL) 20 MG tablet, Take 1 tablet (20 mg total) by mouth 3 (three) times daily as needed for spasms (abd cramping)., Disp: 60 tablet, Rfl: 0   levocetirizine (XYZAL) 5 MG tablet, Take 1 tablet (5 mg total) by mouth every evening., Disp: 30 tablet, Rfl: 2   Multiple Vitamin (MULTI-VITAMIN) tablet, Take 1 tablet by mouth daily., Disp: , Rfl:    PARoxetine (PAXIL) 10 MG tablet,  Take 1 tablet (10 mg total) by mouth daily., Disp: 90 tablet, Rfl: 1   promethazine (PHENERGAN) 25 MG suppository, Place 1 suppository (25 mg total) rectally every 6 (six) hours as needed for refractory nausea / vomiting., Disp: 12 each, Rfl: 0  Allergies  Allergen Reactions   Buspirone Anaphylaxis and Other (See Comments)    Dystonic reaction - arms, legs, jaw "locked up" "lockjaw"    Reviewed chart, meds, last OV  Review of Systems  Constitutional: Negative.   HENT: Negative.    Eyes: Negative.   Respiratory: Negative.    Cardiovascular: Negative.   Gastrointestinal: Negative.   Endocrine: Negative.   Genitourinary: Negative.   Musculoskeletal: Negative.   Skin:  Positive for rash.  Allergic/Immunologic:  Negative.   Neurological: Negative.   Hematological: Negative.   Psychiatric/Behavioral: Negative.    All other systems reviewed and are negative.    Objective:   Virtual encounter, vitals limited, only able to obtain the following There were no vitals filed for this visit. There is no height or weight on file to calculate BMI. Nursing Note and Vital Signs reviewed.  Physical Exam Vitals reviewed.  Constitutional:      General: She is not in acute distress.    Appearance: Normal appearance. She is normal weight. She is not ill-appearing or toxic-appearing.  Pulmonary:     Effort: No respiratory distress.  Skin:    Findings: No rash.  Neurological:     Mental Status: She is alert.  Psychiatric:        Attention and Perception: Attention normal.        Mood and Affect: Mood is anxious. Mood is not depressed.        Speech: Speech normal.        Behavior: Behavior is cooperative.    PE limited by virtual encounter  No results found for this or any previous visit (from the past 72 hour(s)).  Assessment and Plan:   Problem List Items Addressed This Visit       Digestive   IBS (irritable bowel syndrome)    Currently sx well controlled and she was able to take a break from using bentyl         Nervous and Auditory   Cannabis abuse with other cannabis-induced disorder ([place])    Still doing well refraining from use, coping mechanism for uncontrolled psych/mental health sx/dx         Other   Anxiety disorder    Pt indecisive about medications, but is currently having anxieties about rash/scabies that she does not yet have.  Have urged her to make a decision about taking SSRI and stick with it so it has the chance to work in body and we can see if its effective or not. She has not gone to psych I explained SSRIs would be helping manage future "flares" of mood/anxiety and take time to work.  I also reminded her that I would not prescribe ativan or other contolled  substances.  Encouraged her to get est with psych and do meds now while things are more controlled so that way they are helpful in the future. Also explained how she can always get off SSRIs in the future       Other Visit Diagnoses     Pruritus    -  Primary   no rash to evaluate, advised 2nd gen antihistamine daily at bedtime and skin hydration, f/up on mychart if rash develops   Relevant Medications  levocetirizine (XYZAL) 5 MG tablet         -Red flags and when to present for emergency care or RTC including fever >101.83F, chest pain, shortness of breath, new/worsening/un-resolving symptoms, reviewed with patient at time of visit. Follow up and care instructions discussed and provided in AVS. - I discussed the assessment and treatment plan with the patient. The patient was provided an opportunity to ask questions and all were answered. The patient agreed with the plan and demonstrated an understanding of the instructions.  I provided 20 minutes of non-face-to-face time during this encounter.  [name], PA-C 12/14/21 8:58 AM

## 2021-12-24 DIAGNOSIS — N83201 Unspecified ovarian cyst, right side: Secondary | ICD-10-CM | POA: Diagnosis not present

## 2021-12-31 ENCOUNTER — Other Ambulatory Visit: Payer: Medicaid Other

## 2022-01-01 ENCOUNTER — Ambulatory Visit (INDEPENDENT_AMBULATORY_CARE_PROVIDER_SITE_OTHER): Payer: Medicaid Other | Admitting: Family Medicine

## 2022-01-01 ENCOUNTER — Encounter: Payer: Self-pay | Admitting: Family Medicine

## 2022-01-01 VITALS — BP 134/74 | HR 96 | Temp 98.1°F | Resp 16 | Ht 64.0 in | Wt 114.0 lb

## 2022-01-01 DIAGNOSIS — H1032 Unspecified acute conjunctivitis, left eye: Secondary | ICD-10-CM | POA: Diagnosis not present

## 2022-01-01 MED ORDER — CROMOLYN SODIUM 4 % OP SOLN: 1.0000 [drp] | Freq: Four times a day (QID) | OPHTHALMIC | 2 refills | Status: DC | PRN

## 2022-01-01 NOTE — Patient Instructions (Addendum)
Try cromolyn or pataday eye drops for itching   Viral Conjunctivitis, Adult  Viral conjunctivitis is an inflammation of the clear membrane that covers the white part of the eye and the inner surface of the eyelid (conjunctiva). The inflammation is caused by a viral infection. The blood vessels in the conjunctiva become enlarged, causing the eye to become red or pink and often itchy. It usually starts in one eye and goes to the other in a day or two. Infections often resolve over 1-2 weeks. Viral conjunctivitis is contagious. This means it can be easily passed from one person to another. This condition is often called pink eye. What are the causes? This condition is caused by a virus. It can be spread by touching objects that have been contaminated with the virus, such as doorknobs or towels, and then touching your eye. It can also be passed through tiny droplets, such as from coughing or sneezing. What increases the risk? You are more likely to develop this condition if you have a cold or the flu, or are in close contact with a person with pink eye. What are the signs or symptoms? Symptoms of this condition include: Redness in the eye. Tearing or watery eyes. Itchy and irritated eyes. Burning feeling in the eyes. Clear drainage from the eye. Swollen eyelids. A gritty feeling in the eye. Light sensitivity. This condition often occurs with other symptoms, such as nasal congestion, cough, and fever. How is this diagnosed? This condition is diagnosed with a medical history and physical exam. If you have discharge from your eye, the discharge may be tested to rule out other causes of conjunctivitis. How is this treated? Viral conjunctivitis does not respond to medicines that kill bacteria (antibiotics). The condition most often resolves on its own in 1-2 weeks. If treatment is needed, it is aimed at relieving your symptoms and preventing the spread of infection. This may be done with artificial  tear drops, antihistamine drops, or other eye medicines. In rare cases, steroid eye drops or anti-herpes virus medicines may be prescribed. Follow these instructions at home: Medicines  Take or apply over-the-counter and prescription medicines only as told by your health care provider. Do not touch the edge of the eyelid with the eye-drop bottle or ointment tube when applying medicines to the affected eye. This will prevent the spread of the infection to the other eye or to other people. Eye care Avoid touching or rubbing your eyes. Apply a clean, cool, wet washcloth onto your eye for 10-20 minutes, 3-4 times per day, or as told by your health care provider. If you wear contact lenses, do not wear them until the inflammation is gone and your health care provider says it is safe to wear them again. Ask your health care provider how to disinfect or replace your contact lenses before using them again. Wear glasses until you can resume wearing contacts. Avoid wearing eye makeup until the inflammation is gone. Throw away any old eye cosmetics that may be contaminated. Gently wipe away any crusting from your eye with a wet washcloth or a cotton ball. General instructions Change or wash your pillowcase every day or as told by your health care provider. Do not share towels, pillowcases, washcloths, eye makeup, makeup brushes, contact lenses, or eyeglasses. This may spread the infection. Wash your hands often with soap and water. Use paper towels to dry your hands. If soap and water are not available, use hand sanitizer. Avoid contact with other people until your  eye is no longer red and tearing, or as told by your health care provider. Contact a health care provider if: Your symptoms do not improve with treatment, or they get worse. You have increased pain. Your vision becomes blurry. You have a fever. You have facial pain, redness, or swelling. You have yellow or green drainage coming from your  eye. You have new symptoms. Get help right away if: You develop severe pain. Your vision gets much worse. Summary Viral conjunctivitis is an inflammation of the clear membrane that covers the white part of the eye and the inner surface of the eyelid. It usually goes away in 1-2 weeks. This condition is usually treated with medicines and cold compresses. Treatment focuses on relieving the symptoms. This condition is very contagious. To prevent infection, avoid close contact with others, wash your hands often, and do not share towels or washcloths. Contact a health care provider if your symptoms do not go away with treatment, or if you have more pain, poor vision, or swelling in the eyes. Get help right away if you have severe pain or your vision gets much worse. This information is not intended to replace advice given to you by your health care provider. Make sure you discuss any questions you have with your health care provider. Document Revised: 06/07/2019 Document Reviewed: 05/21/2019 Elsevier Patient Education  2022 [employer].

## 2022-01-01 NOTE — Progress Notes (Signed)
Patient ID: Tiffany Guzman, female    DOB: [birth year], 26 y.o.   MRN: [id number]  PCP: [name], PA-C  Chief Complaint  Patient presents with   Conjunctivitis    Pt noticed some pinkish in her left eye this am. Pt states its itchy/pink and feels it irritated.    Subjective:   Tiffany Guzman is a 26 y.o. female, presents to clinic with CC of the following:  HPI  Patient presents for 1 day of left medial eye irritation and itching with some redness she had scant discharge and crusting this morning.  Her son who is with her just got over having pinkeye in 1 eye which spread to the other eye.  His symptoms have improved, she also endorses slight nasal discharge and congestion she denies any eye pain or vision changes  Patient Active Problem List   Diagnosis Date Noted   Cannabis abuse with other cannabis-induced disorder ([place]) 11/28/2021   Current moderate episode of major depressive disorder without prior episode ([place]) 11/28/2021   IBS (irritable bowel syndrome) 03/15/2021   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   Bradycardia 02/26/2021   Right ovarian cyst 02/25/2021   Leukocytosis 02/25/2021   Status post ovarian cystectomy 02/25/2021   S/P cholecystectomy    Status post cesarean delivery 11/14/2017   Dermoid cyst 10/03/2017   Dermoid cyst of left ovary 10/01/2017   Anxiety disorder 06/24/2016      Current Outpatient Medications:    cromolyn (OPTICROM) 4 % ophthalmic solution, Place 1 drop into the left eye 4 (four) times daily as needed., Disp: 10 mL, Rfl: 2   dicyclomine (BENTYL) 20 MG tablet, Take 1 tablet (20 mg total) by mouth 3 (three) times daily as needed for spasms (abd cramping)., Disp: 60 tablet, Rfl: 0   levocetirizine (XYZAL) 5 MG tablet, Take 1 tablet (5 mg total) by mouth every evening., Disp: 30 tablet, Rfl: 2   Multiple Vitamin (MULTI-VITAMIN) tablet, Take 1 tablet by mouth daily., Disp: , Rfl:    PARoxetine (PAXIL) 10 MG tablet, Take 1 tablet  (10 mg total) by mouth daily., Disp: 90 tablet, Rfl: 1   promethazine (PHENERGAN) 25 MG suppository, Place 1 suppository (25 mg total) rectally every 6 (six) hours as needed for refractory nausea / vomiting., Disp: 12 each, Rfl: 0   Allergies  Allergen Reactions   Buspirone Anaphylaxis and Other (See Comments)    Dystonic reaction - arms, legs, jaw "locked up" "lockjaw"      Social History   Tobacco Use   Smoking status: Never   Smokeless tobacco: Never  Vaping Use   Vaping Use: Every day   Start date: 09/20/2019   Substances: Nicotine, Flavoring  Substance Use Topics   Alcohol use: No    Alcohol/week: 0.0 standard drinks of alcohol   Drug use: Yes    Frequency: 7.0 times per week    Types: Marijuana    Comment: has stopped for 10 days      Chart Review Today: I personally reviewed active problem list, medication list, allergies, family history, social history, health maintenance, notes from last encounter, lab results, imaging with the patient/caregiver today.   Review of Systems  Constitutional: Negative.   HENT: Negative.    Eyes: Negative.   Respiratory: Negative.    Cardiovascular: Negative.   Gastrointestinal: Negative.   Endocrine: Negative.   Genitourinary: Negative.   Musculoskeletal: Negative.   Skin: Negative.   Allergic/Immunologic: Negative.   Neurological: Negative.   Hematological:  Negative.   Psychiatric/Behavioral: Negative.    All other systems reviewed and are negative.      Objective:   Vitals:   01/01/22 1434  BP: 134/74  Pulse: 96  Resp: 16  Temp: 98.1 F (36.7 C)  TempSrc: Oral  SpO2: 99%  Weight: 114 lb (51.7 kg)  Height: 5' 4"  (1.626 m)    Body mass index is 19.57 kg/m.  Physical Exam Vitals and nursing note reviewed.  Constitutional:      General: She is not in acute distress.    Appearance: Normal appearance. She is well-developed. She is not ill-appearing, toxic-appearing or diaphoretic.  HENT:     Head:  Normocephalic and atraumatic.     Left Ear: External ear normal.     Nose: Congestion present.     Mouth/Throat:     Pharynx: Oropharynx is clear. No oropharyngeal exudate or posterior oropharyngeal erythema.  Eyes:     General: Lids are normal. No allergic shiner or scleral icterus.       Right eye: No discharge.        Left eye: No discharge.     Extraocular Movements: Extraocular movements intact.     Conjunctiva/sclera:     Right eye: Right conjunctiva is not injected. No chemosis, exudate or hemorrhage.    Left eye: Left conjunctiva is injected. No chemosis or exudate.    Pupils: Pupils are equal, round, and reactive to light.  Neck:     Trachea: No tracheal deviation.  Cardiovascular:     Rate and Rhythm: Normal rate and regular rhythm.  Pulmonary:     Effort: Pulmonary effort is normal. No respiratory distress.     Breath sounds: No stridor.  Musculoskeletal:        General: Normal range of motion.  Skin:    General: Skin is warm and dry.     Findings: No rash.  Neurological:     Mental Status: She is alert.     Motor: No abnormal muscle tone.     Coordination: Coordination normal.  Psychiatric:        Behavior: Behavior normal.      Results for orders placed or performed in visit on 11/06/21  COMPLETE METABOLIC PANEL WITH GFR  Result Value Ref Range   Glucose, Bld 78 65 - 99 mg/dL   BUN 21 7 - 25 mg/dL   Creat 0.82 0.50 - 0.96 mg/dL   eGFR 102 > OR = 60 mL/min/1.11m   BUN/Creatinine Ratio NOT APPLICABLE 6 - 22 (calc)   Sodium 139 135 - 146 mmol/L   Potassium 4.1 3.5 - 5.3 mmol/L   Chloride 100 98 - 110 mmol/L   CO2 26 20 - 32 mmol/L   Calcium 9.6 8.6 - 10.2 mg/dL   Total Protein 7.8 6.1 - 8.1 g/dL   Albumin 4.9 3.6 - 5.1 g/dL   Globulin 2.9 1.9 - 3.7 g/dL (calc)   AG Ratio 1.7 1.0 - 2.5 (calc)   Total Bilirubin 1.0 0.2 - 1.2 mg/dL   Alkaline phosphatase (APISO) 61 31 - 125 U/L   AST 14 10 - 30 U/L   ALT 17 6 - 29 U/L  CBC with Differential/Platelet   Result Value Ref Range   WBC 11.5 (H) 3.8 - 10.8 Thousand/uL   RBC 5.18 (H) 3.80 - 5.10 Million/uL   Hemoglobin 14.7 11.7 - 15.5 g/dL   HCT 44.1 35.0 - 45.0 %   MCV 85.1 80.0 - 100.0 fL   MCH 28.4  27.0 - 33.0 pg   MCHC 33.3 32.0 - 36.0 g/dL   RDW 12.8 11.0 - 15.0 %   Platelets 356 140 - 400 Thousand/uL   MPV 10.7 7.5 - 12.5 fL   Neutro Abs 7,970 (H) 1,500 - 7,800 cells/uL   Lymphs Abs 2,576 850 - 3,900 cells/uL   Absolute Monocytes 851 200 - 950 cells/uL   Eosinophils Absolute 69 15 - 500 cells/uL   Basophils Absolute 35 0 - 200 cells/uL   Neutrophils Relative % 69.3 %   Total Lymphocyte 22.4 %   Monocytes Relative 7.4 %   Eosinophils Relative 0.6 %   Basophils Relative 0.3 %  Magnesium  Result Value Ref Range   Magnesium 2.1 1.5 - 2.5 mg/dL       Assessment & Plan:   1. Acute conjunctivitis of left eye, unspecified acute conjunctivitis type -  cromolyn (OPTICROM) 4 % ophthalmic solution   Mild left medial eye injection and itching onset this am upon waking She hasn't tried any OTC meds, tried not to itch it No purulence, pain, vision disturbances Son just had the same  Likely viral conjunctivitis She has some mild nasal symptoms as well and predominant symptom is itching encouraged her to resume her Xyzal, she can use over-the-counter Pataday drops or try cromolyn mostly encouraged her to ensure she does not rub her eye forcefully, watch hand hygiene, do warm or cold compresses and reassured her that viral conjunctivitis will likely resolve in 1 to 2 weeks      [name], PA-C 01/01/22 3:07 PM

## 2022-01-04 ENCOUNTER — Ambulatory Visit: Payer: Medicaid Other | Admitting: Family Medicine

## 2022-01-08 ENCOUNTER — Ambulatory Visit: Admit: 2022-01-08 | Payer: Medicaid Other | Admitting: Obstetrics and Gynecology

## 2022-01-08 SURGERY — EXCISION, CYST, OVARY, ROBOT-ASSISTED, LAPAROSCOPIC
Anesthesia: Choice | Laterality: Right

## 2022-01-14 ENCOUNTER — Other Ambulatory Visit: Payer: Self-pay | Admitting: *Deleted

## 2022-01-31 ENCOUNTER — Other Ambulatory Visit: Payer: Self-pay | Admitting: Licensed Clinical Social Worker

## 2022-01-31 NOTE — Patient Instructions (Signed)
Visit Information  Tiffany Guzman was given information about Medicaid Managed Care team care coordination services as a part of their [employer] Medicaid benefit. Tiffany Guzman verbally consented to engagement with the [facility] Managed Care team.   If you are experiencing a medical emergency, please call 911 or report to your local emergency department or urgent care.   If you have a non-emergency medical problem during routine business hours, please contact your provider's office and ask to speak with a nurse.   For questions related to your Healthy [facility] health plan, please call: [phone] or visit the homepage here: [email or web address]  If you would like to schedule transportation through your Healthy [facility] plan, please call the following number at least 2 days in advance of your appointment: [phone]  For information about your ride after you set it up, call Ride Assist at [phone]. Use this number to activate a Will Call pickup, or if your transportation is late for a scheduled pickup. Use this number, too, if you need to make a change or cancel a previously scheduled reservation.  If you need transportation services right away, call [phone]. The after-hours call center is staffed 24 hours to handle ride assistance and urgent reservation requests (including discharges) 365 days a year. Urgent trips include sick visits, hospital discharge requests and life-sustaining treatment.  Call the [place] at [phone], at any time, 24 hours a day, 7 days a week. If you are in danger or need immediate medical attention call 911.  If you would like help to quit smoking, call 1-800-QUIT-NOW 629-353-5193) OR Espaol: 1-855-Djelo-Ya (4-008-676-1950) o para ms informacin haga clic aqu or Text READY to 200-400 to register via text  Following is a copy of your plan of care:  Care Plan : LCSW plan of care   Updates made by Greg Cutter, LCSW since 01/31/2022 12:00 AM     Problem: Anxiety Identification (Anxiety)      Long-Range Goal: Anxiety Symptoms Identified   Start Date: 10/11/2021  Priority: High  Note:   Priority: High  Timeframe:  Long-Range Goal Priority:  High Start Date:   10/11/21                Expected End Date:  ongoing                     Follow Up Date--Goal met. Patient has been provided Musician education. Patient reports that she does not wish to pursue mental health treatment anymore at this time due to her overall improvement in her well-being.   - check out counseling - keep 90 percent of counseling appointments - schedule counseling appointment    Why is this important?             Beating depression may take some time.            If you don't feel better right away, don't give up on your treatment plan.    Current barriers:             Chronic Mental Health needs related to stress management, anxiety, eating disorder and ADHD             Needs Support, Education, and Care Coordination in order to meet unmet mental health needs.  Clinical Goal(s): demonstrate a reduction in symptoms related to :  connecting with provider for ongoing mental health treatment, increase coping skills, combating negativity with positive thoughts, work  on creating structure and healthy habits, improve self-management skills and stress reduction      10 LITTLE Things To Do When You're Feeling Too Down To Do Anything  Take a shower. Even if you plan to stay in all day long and not see a soul, take a shower. It takes the most effort to hop in to the shower but once you do, you'll feel immediate results. It will wake you up and you'll be feeling much fresher (and cleaner too).  Brush and floss your teeth. Give your teeth a good brushing with a floss finish. It's a small task but it feels so good and you can check 'taking care of your health' off the list  of things to do.  Do something small on your list. Most of [place] have some small thing we would like to get done (load of laundry, sew a button, email a friend). Doing one of these things will make you feel like you've accomplished something.  Drink water. Drinking water is easy right? It's also really beneficial for your health so keep a glass beside you all day and take sips often. It gives you energy and prevents you from boredom eating.  Do some floor exercises. The last thing you want to do is exercise but it might be just the thing you need the most. Keep it simple and do exercises that involve sitting or laying on the floor. Even the smallest of exercises release chemicals in the brain that make you feel good. Yoga stretches or core exercises are going to make you feel good with minimal effort.  Make your bed. Making your bed takes a few minutes but it's productive and you'll feel relieved when it's done. An unmade bed is a huge visual reminder that you're having an unproductive day. Do it and consider it your housework for the day.  Put on some nice clothes. Take the sweatpants off even if you don't plan to go anywhere. Put on clothes that make you feel good. Take a look in the mirror so your brain recognizes the sweatpants have been replaced with clothes that make you look great. It's an instant confidence booster.  Wash the dishes. A pile of dirty dishes in the sink is a reflection of your mood. It's possible that if you wash up the dishes, your mood will follow suit. It's worth a try.  Cook a real meal. If you have the luxury to have a "do nothing" day, you have time to make a real meal for yourself. Make a meal that you love to eat. The process is good to get you out of the funk and the food will ensure you have more energy for tomorrow.  Write out your thoughts by hand. When you hand write, you stimulate your brain to focus on the moment that you're in so make yourself comfortable  and write whatever comes into your mind. Put those thoughts out on paper so they stop spinning around in your head. Those thoughts might be the very thing holding you down.  Follow up Goal  Eula Fried, BSW, MSW, [facility] Managed Medicaid LCSW [email or web address] Phone: [phone]

## 2022-01-31 NOTE — Patient Outreach (Signed)
Medicaid Managed Care Social Work Note  01/31/2022 Name:  Tiffany Guzman MRN:  [id number] DOB:  August 10, 1995  Tiffany Guzman is an 26 y.o. year old female who is a primary patient of [name], [place].  The Medicaid Managed Care Coordination team was consulted for assistance with:  [place] and Resources  Ms. Bumgardner was given information about Medicaid Managed Care Coordination team services today. [place] Patient agreed to services and verbal consent obtained.  Engaged with patient  for by telephone forfollow up visit in response to referral for case management and/or care coordination services.   Assessments/Interventions:  Review of past medical history, allergies, medications, health status, including review of consultants reports, laboratory and other test data, was performed as part of comprehensive evaluation and provision of chronic care management services.  SDOH: (Social Determinant of Health) assessments and interventions performed: SDOH Interventions    Flowsheet Row Most Recent Value  SDOH Interventions   Stress Interventions Intervention Not Indicated       Advanced Directives Status:  See Care Plan for related entries.  Care Plan                 Allergies  Allergen Reactions   Buspirone Anaphylaxis and Other (See Comments)    Dystonic reaction - arms, legs, jaw "locked up" "lockjaw"     Medications Reviewed Today     Reviewed by Greg Cutter, LCSW (Social Worker) on 01/31/22 at [place] List Status: <None>   Medication Order Taking? Sig Documenting Provider Last Dose Status Informant  cromolyn (OPTICROM) 4 % ophthalmic solution [id number] No Place 1 drop into the left eye 4 (four) times daily as needed.  Patient not taking: Reported on 01/14/2022   [name], PA-C Not Taking Active   dicyclomine (BENTYL) 20 MG tablet [id number] No Take 1 tablet (20 mg total) by mouth 3 (three) times daily as needed for spasms (abd  cramping).  Patient not taking: Reported on 01/14/2022   [name], PA-C Not Taking Active   levocetirizine (XYZAL) 5 MG tablet [id number] No Take 1 tablet (5 mg total) by mouth every evening. [name], PA-C Taking Active   Multiple Vitamin (MULTI-VITAMIN) tablet [id number] No Take 1 tablet by mouth daily. [provider] Taking Active   promethazine (PHENERGAN) 25 MG suppository [id number] No Place 1 suppository (25 mg total) rectally every 6 (six) hours as needed for refractory nausea / vomiting.  Patient not taking: Reported on 01/14/2022   [name], PA-C Not Taking Active             Patient Active Problem List   Diagnosis Date Noted   Cannabis abuse with other cannabis-induced disorder ([place]) 11/28/2021   Current moderate episode of major depressive disorder without prior episode ([place]) 11/28/2021   IBS (irritable bowel syndrome) 03/15/2021   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   Bradycardia 02/26/2021   Right ovarian cyst 02/25/2021   Leukocytosis 02/25/2021   Status post ovarian cystectomy 02/25/2021   S/P cholecystectomy    Status post cesarean delivery 11/14/2017   Dermoid cyst 10/03/2017   Dermoid cyst of left ovary 10/01/2017   Anxiety disorder 06/24/2016    Conditions to be addressed/monitored per PCP order:  Anxiety  Care Plan : LCSW plan of care  Updates made by Greg Cutter, LCSW since 01/31/2022 12:00 AM     Problem: Anxiety Identification (Anxiety)      Long-Range Goal: Anxiety Symptoms Identified   Start Date: 10/11/2021  Priority: High  Note:   Priority: High  Timeframe:  Long-Range Goal Priority:  High Start Date:   10/11/21                Expected End Date:  ongoing                     Follow Up Date--Goal met. Patient has been provided Musician education. Patient reports that she does not wish to pursue mental health treatment anymore at this time due to her overall improvement in her well-being.   -  check out counseling - keep 90 percent of counseling appointments - schedule counseling appointment    Why is this important?             Beating depression may take some time.            If you don't feel better right away, don't give up on your treatment plan.    Current barriers:             Chronic Mental Health needs related to stress management, anxiety, eating disorder and ADHD             Needs Support, Education, and Care Coordination in order to meet unmet mental health needs.  Clinical Goal(s): demonstrate a reduction in symptoms related to :  connecting with provider for ongoing mental health treatment, increase coping skills, combating negativity with positive thoughts, work on creating structure and healthy habits, improve self-management skills and stress reduction      Clinical Interventions:            Assessed patient's previous and current treatment, coping skills, support system and barriers to care  ?         Depression screen reviewed, PHQ 2   ?         Solution-Focused Strategies ?         Mindfulness or Relaxation Training ?         Active listening / Reflection utilized  ?         Emotional Supportive Provided ?         Behavioral Activation ?         Participation in counseling encouraged  ?         Verbalization of feelings encouraged  ?         Crisis Resource Education / information provided  ?         Suicidal Ideation/Homicidal Ideation assessed: No SI/HI ?         CBT intervention implemented ?         Discussed referral for counseling and psychiatry. Patient she is been on Olanzapine for 6 months. She admits that she does have manic episodes. She shares that she has both general and social anxiety.            Reviewed various resources and discussed options on available treatment options. Patient is agreeable to referral for counseling and psychiatry. [facility] LCSW made referral to [facility] on 10/11/21. [facility] LCSW educated patient on the power of thinking positive.  UPDATE 10/19/21- [facility] declined patient's referral because they are unable to take new patients on for therapy and stated that patient needed a higher level of care for ADHD and eating disorder. They stated that they cannot see patient until she gets tested done for ADHD. Patient was upset at this news which is understandable. [facility] LCSW suggested [facility] as a  second option and patient is willing to try this agency to get connected with long term mental health support (both psychiatry and counseling.) Patient will go to [facility] office to get new patient enrollment paperwork. Update 11/01/21- Patient has not been to [place] yet. Patient is waiting to pursue mental health treatment. She states that she has a lot going on physically right now and wishes to focus on her mental health at a different time. Patient is agreeable a follow up call in 90 days to reassess her mental health needs. Patient reports that she has upcoming surgery to remove another ovary cyst. She has already had issues with ovary cyst in the past. Patient was educated again on available mental health resources within her area and was encouraged to contact [facility] LCSW if she has any social work needs that arise that need to be addressed before our scheduled follow up appointment on 01/31/22. Patient agreeable to this plan. UPDATE- Patient reports that she does not wish to pursue mental health treatment anymore at this time due to her overall improvement in her well-being.  [facility] LCSW will close case at this time as patient no longer wishes to implement mental health support at this time. She will notify PCP or [facility] LCSW if future social work concerns arise.  ?         Options for mental health treatment based on need and insurance           Inter-disciplinary care team collaboration (see longitudinal plan of care)           LCSW discussed coping skills for anxiety and depression. SW used empathetic and active and reflective listening, validated feelings/concerns, and  provided emotional support. LCSW provided self-care education to help manage her mental health conditions and improve her mood.            Patient denies any current crises or urgent needs  Patient Goals/Self-Care Activities: Over the next 120 days           Call your insurance provider for more information about your Enhanced Benefits      SDOH Screenings   Alcohol Screen: Low Risk    Last Alcohol Screening Score (AUDIT): 0  Depression (PHQ2-9): Low Risk    PHQ-2 Score: 1  Financial Resource Strain: Not on file  Food Insecurity: No Food Insecurity   Worried About Charity fundraiser in the Last Year: Never true   Ran Out of Food in the Last Year: Never true  Housing: [place] Risk Score: 0  Physical Activity: Not on file  Social Connections: Not on file  Stress: Not on file  Tobacco Use: Low Risk    Smoking Tobacco Use: Never   Smokeless Tobacco Use: Never   Passive Exposure: Not on file  Transportation Needs: No Transportation Needs   Lack of Transportation (Medical): No   Lack of Transportation (Non-Medical): No      10/11/2021    3:06 PM 08/20/2021    8:21 AM 05/30/2021   10:05 AM 03/15/2021    1:52 PM 01/03/2021    3:40 PM  Depression screen PHQ 2/9  Decreased Interest 0 0 2 1 0  Down, Depressed, Hopeless 1 0 1 2 0  PHQ - 2 Score 1 0 3 3 0  Altered sleeping   0 0 0  Tired, decreased energy   3 0 0  Change in appetite   3 0 0  Feeling bad or failure  about yourself    0 0 0  Trouble concentrating   0 0 0  Moving slowly or fidgety/restless   0 0 0  Suicidal thoughts   0 0 0  PHQ-9 Score   9 3 0  Difficult doing work/chores   Somewhat difficult Not difficult at all Not difficult at all   10 LITTLE Things To Do When You're Feeling Too Down To Do Anything  Take a shower. Even if you plan to stay in all day long and not see a soul, take a shower. It takes the most effort to hop in to the shower but once you do, you'll feel immediate results. It will wake  you up and you'll be feeling much fresher (and cleaner too).  Brush and floss your teeth. Give your teeth a good brushing with a floss finish. It's a small task but it feels so good and you can check 'taking care of your health' off the list of things to do.  Do something small on your list. Most of [place] have some small thing we would like to get done (load of laundry, sew a button, email a friend). Doing one of these things will make you feel like you've accomplished something.  Drink water. Drinking water is easy right? It's also really beneficial for your health so keep a glass beside you all day and take sips often. It gives you energy and prevents you from boredom eating.  Do some floor exercises. The last thing you want to do is exercise but it might be just the thing you need the most. Keep it simple and do exercises that involve sitting or laying on the floor. Even the smallest of exercises release chemicals in the brain that make you feel good. Yoga stretches or core exercises are going to make you feel good with minimal effort.  Make your bed. Making your bed takes a few minutes but it's productive and you'll feel relieved when it's done. An unmade bed is a huge visual reminder that you're having an unproductive day. Do it and consider it your housework for the day.  Put on some nice clothes. Take the sweatpants off even if you don't plan to go anywhere. Put on clothes that make you feel good. Take a look in the mirror so your brain recognizes the sweatpants have been replaced with clothes that make you look great. It's an instant confidence booster.  Wash the dishes. A pile of dirty dishes in the sink is a reflection of your mood. It's possible that if you wash up the dishes, your mood will follow suit. It's worth a try.  Cook a real meal. If you have the luxury to have a "do nothing" day, you have time to make a real meal for yourself. Make a meal that you love to eat. The process  is good to get you out of the funk and the food will ensure you have more energy for tomorrow.  Write out your thoughts by hand. When you hand write, you stimulate your brain to focus on the moment that you're in so make yourself comfortable and write whatever comes into your mind. Put those thoughts out on paper so they stop spinning around in your head. Those thoughts might be the very thing holding you down.  Follow up Goal     Follow up:  Patient requests no follow-up at this time.  Plan: The Managed Medicaid care management team is available to follow up with the  patient after provider conversation with the patient regarding recommendation for care management engagement and subsequent re-referral to the care management team.   Eula Fried, BSW, MSW, LCSW Managed Medicaid LCSW [email or web address] Phone: [phone]

## 2022-07-22 NOTE — L&D Delivery Note (Signed)
Delivery Summary for Tiffany Guzman  Labor Events:   Preterm labor: No data found  Rupture date: No data found  Rupture time: No data found  Rupture type: No data found  Fluid Color: No data found  Induction: No data found  Augmentation: No data found  Complications: No data found  Cervical ripening: No data found No data found   No data found     Delivery:   Episiotomy: No data found  Lacerations: No data found  Repair suture: No data found  Repair # of packets: No data found  Blood loss (ml): No data found   Information for the patient's newborn:  Lavern, Jeffres [[id number]]   Delivery 06/04/2023 6:45 PM by  C-Section, Low Transverse Sex:  female Gestational Age: [redacted]w[redacted]d Delivery Clinician:   Living?:         APGARS  One minute Five minutes Ten minutes  Skin color:        Heart rate:        Grimace:        Muscle tone:        Breathing:        Totals: 8  9      Presentation/position:      Resuscitation:   Cord information:    Disposition of cord blood:     Blood gases sent?  Complications:   Placenta: Delivered:       appearance Newborn Measurements: Weight: 7 lb 8.3 oz (3410 g)  Height: 20.08"  Head circumference:    Chest circumference:    Other providers:    Additional  information: Forceps:   Vacuum:   Breech:   Observed anomalies        See Dr. Oretha Milch operative note for details of C-section procedure.    Hildred Laser, MD [facility] at [facility]

## 2022-07-23 ENCOUNTER — Telehealth (INDEPENDENT_AMBULATORY_CARE_PROVIDER_SITE_OTHER): Payer: Medicaid Other | Admitting: Family Medicine

## 2022-07-23 ENCOUNTER — Encounter: Payer: Self-pay | Admitting: Family Medicine

## 2022-07-23 VITALS — BP 118/72 | HR 100 | Temp 99.0°F | Resp 16 | Ht 64.0 in | Wt 129.6 lb

## 2022-07-23 DIAGNOSIS — R509 Fever, unspecified: Secondary | ICD-10-CM

## 2022-07-23 DIAGNOSIS — J069 Acute upper respiratory infection, unspecified: Secondary | ICD-10-CM | POA: Diagnosis not present

## 2022-07-23 LAB — POCT INFLUENZA A/B
Influenza A, POC: NEGATIVE
Influenza B, POC: NEGATIVE

## 2022-07-23 MED ORDER — ALBUTEROL SULFATE (2.5 MG/3ML) 0.083% IN NEBU
2.5000 mg | INHALATION_SOLUTION | Freq: Once | RESPIRATORY_TRACT | Status: AC
  Administered 2022-07-23: 2.5 mg via RESPIRATORY_TRACT

## 2022-07-23 MED ORDER — ALBUTEROL SULFATE HFA 108 (90 BASE) MCG/ACT IN AERS
2.0000 | INHALATION_SPRAY | Freq: Four times a day (QID) | RESPIRATORY_TRACT | 0 refills | Status: DC | PRN
Start: 2022-07-23 — End: 2022-10-09

## 2022-07-23 MED ORDER — BENZONATATE 100 MG PO CAPS: 100.0000 mg | ORAL_CAPSULE | Freq: Three times a day (TID) | ORAL | 0 refills | Status: DC | PRN

## 2022-07-23 MED ORDER — LEVOCETIRIZINE DIHYDROCHLORIDE 5 MG PO TABS: 5.0000 mg | ORAL_TABLET | Freq: Every evening | ORAL | 1 refills | Status: DC

## 2022-07-23 MED ORDER — DM-GUAIFENESIN ER 30-600 MG PO TB12: 1.0000 | ORAL_TABLET | Freq: Two times a day (BID) | ORAL | 0 refills | Status: DC | PRN

## 2022-07-23 NOTE — Progress Notes (Signed)
Patient ID: Tiffany Guzman, female    DOB: 03-13-1996, 27 y.o.   MRN: [id number]  PCP: Tiffany Grana, PA-C  Chief Complaint  Patient presents with   Cough   Nasal Congestion    Also low grade fever, sx started Sun pm.    Subjective:   Tiffany Guzman is a 27 y.o. female, presents to clinic with CC of the following:  Pt did virtual visit/video but will be coming into clinic in an hour for eval/exam and testing in person  I connected with  Tiffany Guzman  on 07/23/22 at 11:40 AM EST by a video enabled telemedicine application and verified that I am speaking with the correct person using two identifiers.  I discussed the limitations of evaluation and management by telemedicine and the availability of in person appointments. The patient expressed understanding and agreed to proceed. Staff also discussed with the patient that there may be a patient responsible charge related to this service. Patient Location: home Provider Location: [facility] Additional Individuals present: none  HPI  Patient presents for 2 days of symptoms including cough, congestion, subjective fever with diffuse bodyaches feeling to her arms and her legs, she endorsed some chest tightness she is concerned because she vapes often, sometimes her cough feels tight but she does deny any pain with breathing dyspnea or dyspnea on exertion.  She has tried DayQuil but nothing else.  She has no history of asthma bronchitis or pneumonia, never previously used inhalers. She reports everyone in her household being ill with similar symptoms, no one has tested for COVID or yet been evaluated or tested at the doctor's office.  Symptoms began 2 days ago, fever seems to be less severe today than it was Sunday and Monday.   Patient Active Problem List   Diagnosis Date Noted   Cannabis abuse with other cannabis-induced disorder ([place]) 11/28/2021   Current moderate episode of major depressive disorder without prior episode  ([place]) 11/28/2021   IBS (irritable bowel syndrome) 03/15/2021   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   Bradycardia 02/26/2021   Right ovarian cyst 02/25/2021   Leukocytosis 02/25/2021   Status post ovarian cystectomy 02/25/2021   S/P cholecystectomy    Status post cesarean delivery 11/14/2017   Dermoid cyst 10/03/2017   Dermoid cyst of left ovary 10/01/2017   Anxiety disorder 06/24/2016      Current Outpatient Medications:    benzonatate (TESSALON) 100 MG capsule, Take 1 capsule (100 mg total) by mouth 3 (three) times daily as needed for cough., Disp: 30 capsule, Rfl: 0   dextromethorphan-guaiFENesin (MUCINEX DM) 30-600 MG 12hr tablet, Take 1 tablet by mouth 2 (two) times daily as needed for cough., Disp: 60 tablet, Rfl: 0   levocetirizine (XYZAL) 5 MG tablet, Take 1 tablet (5 mg total) by mouth every evening., Disp: 30 tablet, Rfl: 2   levocetirizine (XYZAL) 5 MG tablet, Take 1 tablet (5 mg total) by mouth every evening., Disp: 90 tablet, Rfl: 1   Multiple Vitamin (MULTI-VITAMIN) tablet, Take 1 tablet by mouth daily., Disp: , Rfl:    cromolyn (OPTICROM) 4 % ophthalmic solution, Place 1 drop into the left eye 4 (four) times daily as needed. (Patient not taking: Reported on 01/14/2022), Disp: 10 mL, Rfl: 2   dicyclomine (BENTYL) 20 MG tablet, Take 1 tablet (20 mg total) by mouth 3 (three) times daily as needed for spasms (abd cramping). (Patient not taking: Reported on 01/14/2022), Disp: 60 tablet, Rfl: 0   promethazine (PHENERGAN) 25  MG suppository, Place 1 suppository (25 mg total) rectally every 6 (six) hours as needed for refractory nausea / vomiting. (Patient not taking: Reported on 01/14/2022), Disp: 12 each, Rfl: 0   Allergies  Allergen Reactions   Buspirone Anaphylaxis and Other (See Comments)    Dystonic reaction - arms, legs, jaw "locked up" "lockjaw"      Social History   Tobacco Use   Smoking status: Never   Smokeless tobacco: Never  Vaping Use   Vaping Use: Every day    Start date: 09/20/2019   Substances: Nicotine, Flavoring  Substance Use Topics   Alcohol use: No    Alcohol/week: 0.0 standard drinks of alcohol   Drug use: Yes    Frequency: 7.0 times per week    Types: Marijuana    Comment: has stopped for 10 days      Chart Review Today: I personally reviewed active problem list, medication list, allergies, family history, social history, health maintenance, notes from last encounter, lab results, imaging with the patient/caregiver today.   Review of Systems  Constitutional:  Positive for fever. Negative for activity change, appetite change, chills, fatigue and unexpected weight change.  HENT: Negative.    Eyes: Negative.   Respiratory:  Positive for chest tightness, shortness of breath and wheezing.   Cardiovascular: Negative.  Negative for chest pain, palpitations and leg swelling.  Gastrointestinal: Negative.   Endocrine: Negative.   Genitourinary: Negative.   Musculoskeletal: Negative.   Skin: Negative.   Allergic/Immunologic: Negative.   Neurological: Negative.   Hematological: Negative.   Psychiatric/Behavioral: Negative.    All other systems reviewed and are negative.      Objective:   Vitals:   07/23/22 1327  BP: 118/72  Pulse: 100  Resp: 16  Temp: 99 F (37.2 C)  TempSrc: Oral  SpO2: 100%  Weight: 129 lb 9.6 oz (58.8 kg)  Height: _0  (1.626 m)    Body mass index is 22.25 kg/m.  Physical Exam Vitals and nursing note reviewed.  Constitutional:      General: She is not in acute distress.    Appearance: Normal appearance. She is well-developed and normal weight. She is not ill-appearing, toxic-appearing or diaphoretic.  HENT:     Head: Normocephalic and atraumatic.     Nose: Nose normal.  Eyes:     General:        Right eye: No discharge.        Left eye: No discharge.     Conjunctiva/sclera: Conjunctivae normal.  Neck:     Trachea: No tracheal deviation.  Cardiovascular:     Rate and Rhythm: Normal rate  and regular rhythm.     Pulses: Normal pulses.     Heart sounds: Normal heart sounds.  Pulmonary:     Effort: Pulmonary effort is normal. No respiratory distress.     Breath sounds: No stridor. Rhonchi present. No rales.  Musculoskeletal:        General: Normal range of motion.  Skin:    General: Skin is warm and dry.     Findings: No rash.  Neurological:     Mental Status: She is alert.     Motor: No abnormal muscle tone.     Coordination: Coordination normal.  Psychiatric:        Mood and Affect: Mood normal.        Behavior: Behavior normal.      Results for orders placed or performed in visit on 11/06/21  COMPLETE METABOLIC PANEL WITH  GFR  Result Value Ref Range   Glucose, Bld 78 65 - 99 mg/dL   BUN 21 7 - 25 mg/dL   Creat 0.82 0.50 - 0.96 mg/dL   eGFR 102 > OR = 60 mL/min/1.71m   BUN/Creatinine Ratio NOT APPLICABLE 6 - 22 (calc)   Sodium 139 135 - 146 mmol/L   Potassium 4.1 3.5 - 5.3 mmol/L   Chloride 100 98 - 110 mmol/L   CO2 26 20 - 32 mmol/L   Calcium 9.6 8.6 - 10.2 mg/dL   Total Protein 7.8 6.1 - 8.1 g/dL   Albumin 4.9 3.6 - 5.1 g/dL   Globulin 2.9 1.9 - 3.7 g/dL (calc)   AG Ratio 1.7 1.0 - 2.5 (calc)   Total Bilirubin 1.0 0.2 - 1.2 mg/dL   Alkaline phosphatase (APISO) 61 31 - 125 U/L   AST 14 10 - 30 U/L   ALT 17 6 - 29 U/L  CBC with Differential/Platelet  Result Value Ref Range   WBC 11.5 (H) 3.8 - 10.8 Thousand/uL   RBC 5.18 (H) 3.80 - 5.10 Million/uL   Hemoglobin 14.7 11.7 - 15.5 g/dL   HCT 44.1 35.0 - 45.0 %   MCV 85.1 80.0 - 100.0 fL   MCH 28.4 27.0 - 33.0 pg   MCHC 33.3 32.0 - 36.0 g/dL   RDW 12.8 11.0 - 15.0 %   Platelets 356 140 - 400 Thousand/uL   MPV 10.7 7.5 - 12.5 fL   Neutro Abs 7,970 (H) 1,500 - 7,800 cells/uL   Lymphs Abs 2,576 850 - 3,900 cells/uL   Absolute Monocytes 851 200 - 950 cells/uL   Eosinophils Absolute 69 15 - 500 cells/uL   Basophils Absolute 35 0 - 200 cells/uL   Neutrophils Relative % 69.3 %   Total Lymphocyte 22.4 %    Monocytes Relative 7.4 %   Eosinophils Relative 0.6 %   Basophils Relative 0.3 %  Magnesium  Result Value Ref Range   Magnesium 2.1 1.5 - 2.5 mg/dL       Assessment & Plan:   Febrile illness/URI - pt is well-appearing on video, she is coming in with her sister and about an hour we will get full vital signs I will examine her and we will test her for flu She was encouraged to buy and test at home for COVID -send out labs to [facility] have been taking 4+ days She was encouraged to use antihistamine, Mucinex, Delsym or Robitussin, and Tylenol and ibuprofen for aches pains and fever Well-appearing without any respiratory distress, patient is anxious about her lungs wondering if there is "fluid in them" because of her vaping, history of severe anxiety. I reassured her that based on her symptoms she likely has a viral illness possibly flu or COVID which we can treat, depending on how her lungs sound when I examined her we may add an x-ray but how she looks right now on her current symptoms I do not believe it is indicated or needed at this point.    ICD-10-CM   1. Upper respiratory tract infection, unspecified type  J06.9 benzonatate (TESSALON) 100 MG capsule    levocetirizine (XYZAL) 5 MG tablet    dextromethorphan-guaiFENesin (MUCINEX DM) 30-600 MG 12hr tablet    POCT Influenza A/B    albuterol (PROVENTIL) (2.5 MG/3ML) 0.083% nebulizer solution 2.5 mg    2. Febrile illness  R50.9 POCT Influenza A/B   home test COVID, test in office for flu      Flu neg -  advised strongly to covid test at home Pts lungs with congestion/rhonchi at the bases  Given neb tx in clinic and reevaluated afterwards, able to take deep breath easier, rhonchi cleared, she noted improved/resolved tightness - inhaler sent in -tx for bronchitis - mucinex, inhaler and other supportive and sx measures as noted above  follow up as needed    Tiffany Grana, PA-C 07/23/22 12:04 PM

## 2022-07-23 NOTE — Progress Notes (Deleted)
Name: Tiffany Guzman   MRN: [id number]    DOB: 02-27-1996   Date:07/23/2022       Progress Note  Subjective:    Chief Complaint  Chief Complaint  Patient presents with   Cough   Nasal Congestion    Also low grade fever, sx started Sun pm.    I connected with  Tiffany Guzman  on 07/23/22 at 11:40 AM EST by a video enabled telemedicine application and verified that I am speaking with the correct person using two identifiers.  I discussed the limitations of evaluation and management by telemedicine and the availability of in person appointments. The patient expressed understanding and agreed to proceed. Staff also discussed with the patient that there may be a patient responsible charge related to this service. Patient Location: *** Provider Location: *** Additional Individuals present: ***  HPI   Patient Active Problem List   Diagnosis Date Noted   Cannabis abuse with other cannabis-induced disorder ([place]) 11/28/2021   Current moderate episode of major depressive disorder without prior episode ([place]) 11/28/2021   IBS (irritable bowel syndrome) 03/15/2021   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   Bradycardia 02/26/2021   Right ovarian cyst 02/25/2021   Leukocytosis 02/25/2021   Status post ovarian cystectomy 02/25/2021   S/P cholecystectomy    Status post cesarean delivery 11/14/2017   Dermoid cyst 10/03/2017   Dermoid cyst of left ovary 10/01/2017   Anxiety disorder 06/24/2016    Social History   Tobacco Use   Smoking status: Never   Smokeless tobacco: Never  Substance Use Topics   Alcohol use: No    Alcohol/week: 0.0 standard drinks of alcohol     Current Outpatient Medications:    benzonatate (TESSALON) 100 MG capsule, Take 1 capsule (100 mg total) by mouth 3 (three) times daily as needed for cough., Disp: 30 capsule, Rfl: 0   dextromethorphan-guaiFENesin (MUCINEX DM) 30-600 MG 12hr tablet, Take 1 tablet by mouth 2 (two) times daily as needed for cough.,  Disp: 60 tablet, Rfl: 0   levocetirizine (XYZAL) 5 MG tablet, Take 1 tablet (5 mg total) by mouth every evening., Disp: 30 tablet, Rfl: 2   levocetirizine (XYZAL) 5 MG tablet, Take 1 tablet (5 mg total) by mouth every evening., Disp: 90 tablet, Rfl: 1   Multiple Vitamin (MULTI-VITAMIN) tablet, Take 1 tablet by mouth daily., Disp: , Rfl:    cromolyn (OPTICROM) 4 % ophthalmic solution, Place 1 drop into the left eye 4 (four) times daily as needed. (Patient not taking: Reported on 01/14/2022), Disp: 10 mL, Rfl: 2   dicyclomine (BENTYL) 20 MG tablet, Take 1 tablet (20 mg total) by mouth 3 (three) times daily as needed for spasms (abd cramping). (Patient not taking: Reported on 01/14/2022), Disp: 60 tablet, Rfl: 0   promethazine (PHENERGAN) 25 MG suppository, Place 1 suppository (25 mg total) rectally every 6 (six) hours as needed for refractory nausea / vomiting. (Patient not taking: Reported on 01/14/2022), Disp: 12 each, Rfl: 0  Allergies  Allergen Reactions   Buspirone Anaphylaxis and Other (See Comments)    Dystonic reaction - arms, legs, jaw "locked up" "lockjaw"     ***  Review of Systems    Objective:   Virtual encounter, vitals limited, only able to obtain the following There were no vitals filed for this visit. There is no height or weight on file to calculate BMI. Nursing Note and Vital Signs reviewed.  Physical Exam  PE limited by virtual encounter  No results  found for this or any previous visit (from the past 69 hour(s)).  Assessment and Plan:     ICD-10-CM   1. Upper respiratory tract infection, unspecified type  J06.9 benzonatate (TESSALON) 100 MG capsule    levocetirizine (XYZAL) 5 MG tablet    dextromethorphan-guaiFENesin (MUCINEX DM) 30-600 MG 12hr tablet    2. Febrile illness  R50.9        -Red flags and when to present for emergency care or RTC including fever >101.31F, chest pain, shortness of breath, new/worsening/un-resolving symptoms, reviewed with patient  at time of visit. Follow up and care instructions discussed and provided in AVS. - I discussed the assessment and treatment plan with the patient. The patient was provided an opportunity to ask questions and all were answered. The patient agreed with the plan and demonstrated an understanding of the instructions.  I provided *** minutes of non-face-to-face time during this encounter.  Tiffany Grana, PA-C 07/23/22 12:04 PM

## 2022-07-23 NOTE — Patient Instructions (Signed)
Recommend you get and start for 1 + weeks  Mucinex Cough meds - they may help a little - delsym, robitussin or any DM An antihistamine like claritin, zyrtec, or xyzal ( I refilled the one you have on the chart) And tylenol and ibuprofen as directed to help with body aches/fever

## 2022-08-22 ENCOUNTER — Ambulatory Visit (LOCAL_COMMUNITY_HEALTH_CENTER): Payer: Self-pay

## 2022-08-22 VITALS — BP 111/68 | Ht 64.0 in | Wt 131.0 lb

## 2022-08-22 DIAGNOSIS — Z3202 Encounter for pregnancy test, result negative: Secondary | ICD-10-CM | POA: Diagnosis not present

## 2022-08-22 LAB — PREGNANCY, URINE: Preg Test, Ur: NEGATIVE

## 2022-08-22 NOTE — Progress Notes (Signed)
UPT negative. Trying to become pregnant for over a month and hasn't started her period for the month. Has normal periods LMP 07/18/22. No BCM at this time. Counseled on taking PNV and declines them today. States will get some OTC prenatal gummies. Consulted with provider. Advised pt to take at home pregnancy test in a week. Last sex 08/17/22 unprotected. Copy of [facility] given to pt.

## 2022-09-04 ENCOUNTER — Ambulatory Visit: Payer: Medicaid Other | Admitting: Dermatology

## 2022-09-04 ENCOUNTER — Encounter: Payer: Self-pay | Admitting: Dermatology

## 2022-09-04 VITALS — BP 111/74 | HR 77

## 2022-09-04 DIAGNOSIS — B078 Other viral warts: Secondary | ICD-10-CM

## 2022-09-04 DIAGNOSIS — L700 Acne vulgaris: Secondary | ICD-10-CM | POA: Diagnosis not present

## 2022-09-04 MED ORDER — WINLEVI 1 % EX CREA: TOPICAL_CREAM | CUTANEOUS | 3 refills | Status: DC

## 2022-09-04 MED ORDER — ARAZLO 0.045 % EX LOTN: TOPICAL_LOTION | CUTANEOUS | 2 refills | Status: DC

## 2022-09-04 NOTE — Progress Notes (Unsigned)
New Patient Visit  Subjective  Tiffany Guzman is a 27 y.o. female who presents for the following: Skin Problem (Spot on right thumb. Painful to touch. Dur: 2 weeks. Denies trauma to area).  The patient has spots, moles and lesions to be evaluated, some may be new or changing and the patient has concerns that these could be cancer.  Review of Systems: No other skin or systemic complaints except as noted in HPI or Assessment and Plan.   Objective  Well appearing patient in no apparent distress; mood and affect are within normal limits.  A focused examination was performed including right thumb. Relevant physical exam findings are noted in the Assessment and Plan.  Right Palmar Thumb x2 (2) Small verrucous papules  face 2+ open comedones, scattered inflammatory papules and few cysts along jaw   Assessment & Plan  Other viral warts (2) Right Palmar Thumb x2  Viral Wart (HPV) Counseling  Discussed viral / HPV (Human Papilloma Virus) etiology and risk of spread /infectivity to other areas of body as well as to other people.  Multiple treatments and methods may be required to clear warts and it is possible treatment may not be successful.  Treatment risks include discoloration; scarring and there is still potential for wart recurrence.  Destruction of lesion - Right Palmar Thumb x2  Destruction method: chemical removal   Informed consent: discussed and consent obtained   Timeout:  patient name, date of birth, surgical site, and procedure verified Chemical destruction method comment:  Cantharadin plus Procedure instructions: patient instructed to wash and dry area   Procedure instructions comment:  Wash off in 4-6 hours Outcome: patient tolerated procedure well with no complications   Post-procedure details: wound care instructions given   Additional details:  Squaric Acid 3% applied to warts today. Prior to application reviewed risk of inflammation and  irritation. Cantharidin Plus is a blistering agent that comes from a beetle.  It needs to be washed off in about 4 hours after application.  Although it is painless when applied in office, it may cause symptoms of mild pain and burning several hours later.  Treated areas will swell and turn red, and blisters may form.  Vaseline and a bandaid may be applied until wound has healed.  Once healed, the skin may remain temporarily discolored.  It can take weeks to months for pigmentation to return to normal.  Advised to wash off with soap and water in 4 hours or sooner if it becomes tender before then.   Acne vulgaris face  Chronic and persistent condition with duration or expected duration over one year. Condition is symptomatic / bothersome to patient. Not to goal.   Start Winlevi cream thin film to face twice daily.   Start Arazlo cream pea-sized amount at bedtime to face, wash off in morning. Start out every other night, gradually increase to every night as tolerated.   Topical retinoid medications like tretinoin/Retin-A, adapalene/Differin, tazarotene/Fabior, and Epiduo/Epiduo Forte can cause dryness and irritation when first started. Only apply a pea-sized amount to the entire affected area. Avoid applying it around the eyes, edges of mouth and creases at the nose. If you experience irritation, use a good moisturizer first and/or apply the medicine less often. If you are doing well with the medicine, you can increase how often you use it until you are applying every night. Be careful with sun protection while using this medication as it can make you sensitive to the sun. This medicine should  not be used by pregnant women.   Your prescription was sent to [facility] in [place]. A representative from [place] will contact you within 3 business hours to verify your address and insurance information to schedule a free delivery. If for any reason you do not receive a phone call from them,  please reach out to them. Their phone number is [phone] and their hours are Monday-Friday 9:00 am-5:00 pm.    Tazarotene (ARAZLO) 0.045 % LOTN - face Apply pea-sized amount to face at bedtime, wash off in morning  Clascoterone (WINLEVI) 1 % CREA - face Apply thin film twice daily to face   Return in about 4 weeks (around 10/02/2022) for Wart Follow UP.  I, Emelia Salisbury, CMA, am acting as scribe for Forest Gleason, MD.

## 2022-09-04 NOTE — Patient Instructions (Addendum)
Cantharidin Plus is a blistering agent that comes from a beetle.  It needs to be washed off in about 4 hours after application.  Although it is painless when applied in office, it may cause symptoms of mild pain and burning several hours later.  Treated areas will swell and turn red, and blisters may form.  Vaseline and a bandaid may be applied until wound has healed.  Once healed, the skin may remain temporarily discolored.  It can take weeks to months for pigmentation to return to normal.  Advised to wash off with soap and water in 4 hours or sooner if it becomes tender before then.    Instructions for After In-Office Application of Cantharidin  1. This is a strong medicine; please follow ALL instructions.  2. Gently wash off with soap and water in four hours or sooner s directed by your physician.  3. **WARNING** this medicine can cause severe blistering, blood blisters, infection, and/or scarring if it is not washed off as directed.  4. Your progress will be rechecked in 1-2 months; call sooner if there are any questions or problems.     Acne:  Start Winlevi cream thin film to face twice daily.   Start Arazlo cream pea-sized amount at bedtime to face, wash off in morning. Start out every other night, gradually increase to every night as tolerated.   Topical retinoid medications like tretinoin/Retin-A, adapalene/Differin, tazarotene/Fabior, and Epiduo/Epiduo Forte can cause dryness and irritation when first started. Only apply a pea-sized amount to the entire affected area. Avoid applying it around the eyes, edges of mouth and creases at the nose. If you experience irritation, use a good moisturizer first and/or apply the medicine less often. If you are doing well with the medicine, you can increase how often you use it until you are applying every night. Be careful with sun protection while using this medication as it can make you sensitive to the sun. This medicine should not be used by  pregnant women.   Your prescription was sent to [facility] in [place]. A representative from [place] will contact you within 3 business hours to verify your address and insurance information to schedule a free delivery. If for any reason you do not receive a phone call from them, please reach out to them. Their phone number is [phone] and their hours are Monday-Friday 9:00 am-5:00 pm.        Due to recent changes in healthcare laws, you may see results of your pathology and/or laboratory studies on MyChart before the doctors have had a chance to review them. We understand that in some cases there may be results that are confusing or concerning to you. Please understand that not all results are received at the same time and often the doctors may need to interpret multiple results in order to provide you with the best plan of care or course of treatment. Therefore, we ask that you please give [place] 2 business days to thoroughly review all your results before contacting the office for clarification. Should we see a critical lab result, you will be contacted sooner.   If You Need Anything After Your Visit  If you have any questions or concerns for your doctor, please call our main line at [phone] and press option 4 to reach your doctor's medical assistant. If no one answers, please leave a voicemail as directed and we will return your call as soon as possible. Messages left after 4 pm will be answered the  following business day.   You may also send [place] a message via [place]. We typically respond to MyChart messages within 1-2 business days.  For prescription refills, please ask your pharmacy to contact our office. Our fax number is [phone].  If you have an urgent issue when the clinic is closed that cannot wait until the next business day, you can page your doctor at the number below.    Please note that while we do our best to be available for urgent issues outside of  office hours, we are not available 24/7.   If you have an urgent issue and are unable to reach [place], you may choose to seek medical care at your doctor's office, retail clinic, urgent care center, or emergency room.  If you have a medical emergency, please immediately call 911 or go to the emergency department.  Pager Numbers  - Dr. Nehemiah Massed: [phone]  - Dr. Laurence Ferrari: [phone]  - Dr. Nicole Kindred: [phone]  In the event of inclement weather, please call our main line at [phone] for an update on the status of any delays or closures.  Dermatology Medication Tips: Please keep the boxes that topical medications come in in order to help keep track of the instructions about where and how to use these. Pharmacies typically print the medication instructions only on the boxes and not directly on the medication tubes.   If your medication is too expensive, please contact our office at [phone] option 4 or send [place] a message through [place].   We are unable to tell what your co-pay for medications will be in advance as this is different depending on your insurance coverage. However, we may be able to find a substitute medication at lower cost or fill out paperwork to get insurance to cover a needed medication.   If a prior authorization is required to get your medication covered by your insurance company, please allow [place] 1-2 business days to complete this process.  Drug prices often vary depending on where the prescription is filled and some pharmacies may offer cheaper prices.  The website www.goodrx.com contains coupons for medications through different pharmacies. The prices here do not account for what the cost may be with help from insurance (it may be cheaper with your insurance), but the website can give you the price if you did not use any insurance.  - You can print the associated coupon and take it with your prescription to the pharmacy.  - You may also stop by our office during  regular business hours and pick up a GoodRx coupon card.  - If you need your prescription sent electronically to a different pharmacy, notify our office through [facility] or by phone at [phone] option 4.     Si Usted Necesita Algo Despus de Su Visita  Tambin puede enviarnos un mensaje a travs de Pharmacist, community. Por lo general respondemos a los mensajes de MyChart en el transcurso de 1 a 2 das hbiles.  Para renovar recetas, por favor pida a su farmacia que se ponga en contacto con nuestra oficina. Harland Dingwall de fax es [place] [phone].  Si tiene un asunto urgente cuando la clnica est cerrada y que no puede esperar hasta el siguiente da hbil, puede llamar/localizar a su doctor(a) al nmero que aparece a continuacin.   Por favor, tenga en cuenta que aunque hacemos todo lo posible para estar disponibles para asuntos urgentes fuera del horario de oficina, no estamos disponibles las 24 horas del da, los 7  das de [employer].   Si tiene un problema urgente y no puede comunicarse con nosotros, puede optar por buscar atencin mdica  en el consultorio de su doctor(a), en una clnica privada, en un centro de atencin urgente o en una sala de emergencias.  Si tiene Engineering geologist, por favor llame inmediatamente al 911 o vaya a la sala de emergencias.  Nmeros de bper  - Dr. Nehemiah Massed: [phone]  - Dra. Moye: [phone]  - Dra. Nicole Kindred: [phone]  En caso de inclemencias del [place], por favor llame a Johnsie Kindred principal al [phone] para una actualizacin sobre el [place] de cualquier retraso o cierre.  Consejos para la medicacin en dermatologa: Por favor, guarde las cajas en las que vienen los medicamentos de uso tpico para ayudarle a seguir las instrucciones sobre dnde y cmo usarlos. Las farmacias generalmente imprimen las instrucciones del medicamento slo en las cajas y no directamente en los tubos del [place].   Si su medicamento es muy  caro, por favor, pngase en contacto con Zigmund Daniel llamando al [phone] y presione la opcin 4 o envenos un mensaje a travs de Pharmacist, community.   No podemos decirle cul ser su copago por los medicamentos por adelantado ya que esto es diferente dependiendo de la cobertura de su seguro. Sin embargo, es posible que podamos encontrar un medicamento sustituto a Electrical engineer un formulario para que el seguro cubra el medicamento que se considera necesario.   Si se requiere una autorizacin previa para que su compaa de seguros [place] su medicamento, por favor permtanos de 1 a 2 das hbiles para completar este proceso.  Los precios de los medicamentos varan con frecuencia dependiendo del Environmental consultant de dnde se surte la receta y alguna farmacias pueden ofrecer precios ms baratos.  El sitio web www.goodrx.com tiene cupones para medicamentos de Airline pilot. Los precios aqu no tienen en cuenta lo que podra costar con la ayuda del seguro (puede ser ms barato con su seguro), pero el sitio web puede darle el precio si no utiliz Research scientist (physical sciences).  - Puede imprimir el cupn correspondiente y llevarlo con su receta a la farmacia.  - Tambin puede pasar por nuestra oficina durante el horario de atencin regular y Charity fundraiser una tarjeta de cupones de GoodRx.  - Si necesita que su receta se enve electrnicamente a una farmacia diferente, informe a nuestra oficina a travs de MyChart de [place] o por telfono llamando al [phone] y presione la opcin 4.

## 2022-09-05 ENCOUNTER — Encounter: Payer: Self-pay | Admitting: Dermatology

## 2022-09-05 HISTORY — PX: WISDOM TOOTH EXTRACTION: SHX21

## 2022-09-14 ENCOUNTER — Encounter: Payer: Self-pay | Admitting: Emergency Medicine

## 2022-09-14 ENCOUNTER — Emergency Department
Admission: EM | Admit: 2022-09-14 | Discharge: 2022-09-14 | Disposition: A | Discharge status: Home / Self Care | Payer: Medicaid Other | Attending: Emergency Medicine | Admitting: Emergency Medicine

## 2022-09-14 DIAGNOSIS — R197 Diarrhea, unspecified: Secondary | ICD-10-CM | POA: Insufficient documentation

## 2022-09-14 DIAGNOSIS — R1110 Vomiting, unspecified: Secondary | ICD-10-CM

## 2022-09-14 LAB — COMPREHENSIVE METABOLIC PANEL
ALT: 36 U/L (ref 0–44)
AST: 28 U/L (ref 15–41)
Albumin: 4.4 g/dL (ref 3.5–5.0)
Alkaline Phosphatase: 67 U/L (ref 38–126)
Anion gap: 13 (ref 5–15)
BUN: 21 mg/dL — ABNORMAL HIGH (ref 6–20)
CO2: 21 mmol/L — ABNORMAL LOW (ref 22–32)
Calcium: 8.8 mg/dL — ABNORMAL LOW (ref 8.9–10.3)
Chloride: 103 mmol/L (ref 98–111)
Creatinine, Ser: 0.79 mg/dL (ref 0.44–1.00)
GFR, Estimated: >60 mL/min (ref >60)
Glucose, Bld: 91 mg/dL (ref 70–99)
Potassium: 3.5 mmol/L (ref 3.5–5.1)
Sodium: 137 mmol/L (ref 135–145)
Total Bilirubin: 1.2 mg/dL (ref 0.3–1.2)
Total Protein: 7.9 g/dL (ref 6.5–8.1)

## 2022-09-14 LAB — POC URINE PREG, ED: Preg Test, Ur: NEGATIVE

## 2022-09-14 LAB — URINALYSIS, ROUTINE W REFLEX MICROSCOPIC
Bilirubin Urine: NEGATIVE
Glucose, UA: NEGATIVE mg/dL
Ketones, ur: 80 mg/dL — AB
Leukocytes,Ua: NEGATIVE
Nitrite: NEGATIVE
Protein, ur: 30 mg/dL — AB
Specific Gravity, Urine: 1.024 (ref 1.005–1.030)
pH: 5 (ref 5.0–8.0)

## 2022-09-14 LAB — CBC
HCT: 40.1 % (ref 36.0–46.0)
Hemoglobin: 13.2 g/dL (ref 12.0–15.0)
MCH: 28 pg (ref 26.0–34.0)
MCHC: 32.9 g/dL (ref 30.0–36.0)
MCV: 85 fL (ref 80.0–100.0)
Platelets: 303 10*3/uL (ref 150–400)
RBC: 4.72 MIL/uL (ref 3.87–5.11)
RDW: 12.5 % (ref 11.5–15.5)
WBC: 8.3 10*3/uL (ref 4.0–10.5)
nRBC: 0 % (ref 0.0–0.2)

## 2022-09-14 LAB — LIPASE, BLOOD: Lipase: 29 U/L (ref 11–51)

## 2022-09-14 MED ORDER — LACTATED RINGERS IV BOLUS
1000.0000 mL | Freq: Once | INTRAVENOUS | Status: AC
  Administered 2022-09-14: 1000 mL via INTRAVENOUS

## 2022-09-14 MED ORDER — ONDANSETRON HCL 4 MG/2ML IJ SOLN
4.0000 mg | Freq: Once | INTRAMUSCULAR | Status: AC
  Administered 2022-09-14: 4 mg via INTRAVENOUS
  Filled 2022-09-14: qty 2

## 2022-09-14 MED ORDER — LACTATED RINGERS IV BOLUS
1000.0000 mL | Freq: Once | INTRAVENOUS | Status: AC
Start: 2022-09-14 — End: 2022-09-14
  Administered 2022-09-14: 1000 mL via INTRAVENOUS

## 2022-09-14 NOTE — ED Provider Notes (Signed)
[facility] Provider Note    Event Date/Time   First MD Initiated Contact with Patient 09/14/22 [phone]     (approximate)   History   Abdominal Pain   HPI  Tiffany Guzman is a 27 y.o. female past medical history of ADHD, GERD, migraines, who presents because of vomiting and diarrhea.  Symptoms started last night with bodyaches chills and fever.  She then has had multiple episodes of vomiting diarrhea about 4-5 episodes of vomiting 4-5 episodes of nonbloody diarrhea.  She has some intermittent abdominal cramping but denies severe pain is diffuse cramping.  She has tried to drink water but it comes back up immediately.  Her son had vomiting 2 days ago that lasted just for about half a day and resolved.  Fevers have resolved since last night she endorses some bodyaches and low back pain no upper back pain denies urinary symptoms.     Past Medical History:  Diagnosis Date   Acne    Anxiety    Attention deficit hyperactivity disorder (ADHD)    Chronic upper back pain    lower and upper back pain   Depression    Dyspnea    GERD (gastroesophageal reflux disease)    Gestational hypertension 11/14/2017   Migraine with aura    since elementary school   Postoperative nausea and vomiting 02/27/2021    Patient Active Problem List   Diagnosis Date Noted   Cannabis abuse with other cannabis-induced disorder ([place]) 11/28/2021   Current moderate episode of major depressive disorder without prior episode ([place]) 11/28/2021   IBS (irritable bowel syndrome) 03/15/2021   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   Bradycardia 02/26/2021   Right ovarian cyst 02/25/2021   Leukocytosis 02/25/2021   Status post ovarian cystectomy 02/25/2021   S/P cholecystectomy    Status post cesarean delivery 11/14/2017   Dermoid cyst 10/03/2017   Dermoid cyst of left ovary 10/01/2017   Anxiety disorder 06/24/2016     Physical Exam  Triage Vital Signs: ED Triage Vitals  Enc Vitals  Group     BP 09/14/22 0634 103/70     Pulse Rate 09/14/22 0634 (!) 131     Resp 09/14/22 0634 18     Temp 09/14/22 0634 97.6 F (36.4 C)     Temp Source 09/14/22 0634 Oral     SpO2 09/14/22 0634 97 %     Weight 09/14/22 0630 125 lb (56.7 kg)     Height 09/14/22 0630 '5\' 4"'$  (1.626 m)     Head Circumference --      Peak Flow --      Pain Score 09/14/22 0629 4     Pain Loc --      Pain Edu? --      Excl. in [place]? --     Most recent vital signs: Vitals:   09/14/22 0906 09/14/22 0930  BP: (!) 132/92 125/88  Pulse: (!) 110 (!) 104  Resp: 12 15  Temp:    SpO2: 100% 100%     General: Awake, no distress.  CV:  Good peripheral perfusion.  Tachycardic Resp:  Normal effort.  Abd:  No distention.  Abdomen is soft, minimal tenderness in the epigastric region but no guarding no right lower quadrant tenderness Neuro:             Awake, Alert, Oriented x 3  Other:  Mucous membranes are dry   ED Results / Procedures / Treatments  Labs (all labs ordered are listed,  but only abnormal results are displayed) Labs Reviewed  COMPREHENSIVE METABOLIC PANEL - Abnormal; Notable for the following components:      Result Value   CO2 21 (*)    BUN 21 (*)    Calcium 8.8 (*)    All other components within normal limits  LIPASE, BLOOD  CBC  URINALYSIS, ROUTINE W REFLEX MICROSCOPIC  POC URINE PREG, ED     EKG  EKG reviewed interpreted myself as sinus tachycardia normal axis normal intervals ST depression lead II and aVF   RADIOLOGY    PROCEDURES:  Critical Care performed: No  .1-3 Lead EKG Interpretation  Performed by: Rada Hay, MD Authorized by: Rada Hay, MD     Interpretation: abnormal     ECG rate assessment: tachycardic     Rhythm: sinus tachycardia     Ectopy: none     Conduction: normal     The patient is on the cardiac monitor to evaluate for evidence of arrhythmia and/or significant heart rate changes.   MEDICATIONS ORDERED IN ED: Medications   lactated ringers bolus 1,000 mL (0 mLs Intravenous Stopped 09/14/22 0828)  ondansetron (ZOFRAN) injection 4 mg (4 mg Intravenous Given 09/14/22 0726)  lactated ringers bolus 1,000 mL (0 mLs Intravenous Stopped 09/14/22 0940)     IMPRESSION / MDM / ASSESSMENT AND PLAN / ED COURSE  I reviewed the triage vital signs and the nursing notes.                              Patient's presentation is most consistent with acute complicated illness / injury requiring diagnostic workup.  Differential diagnosis includes, but is not limited to, viral gastroenteritis, colitis, influenza, COVID, less likely appendicitis, diverticulitis, pyelonephritis  The patient is a 27 year old female presents with vomiting diarrhea cramping abdominal pain since last night.  She has had about 4-5 episodes of both vomiting and diarrhea and has not been able to keep fluids down since yesterday.  Initially did have fever but this is resolved.  She has some cramping abdominal pain but abdominal pain is not severe comes and goes.  No urinary symptoms.  Son did have similar vomiting several days ago.  Patient is tachycardic on arrival but other vital signs are reassuring.  Overall she looks well mucous membranes are somewhat dry abdominal exam is benign.  I suspect that this is likely a viral gastroenteritis.  Given patient's tachycardia she will need IV rehydration will give a bolus of fluid and Zofran.  I have low suspicion for acute abdomen given benign exam.  Will check basic labs pregnancy test and urinalysis.  Labs overall reassuring no leukocytosis or anemia.  BUN mildly elevated creatinine normal.  Lipase and LFTs are normal.  Patient's heart rate is coming down after first bolus but remains tachycardic.  Will give another bolus.  Patient's tachycardia improved.  She is tolerating p.o.  Pregnancy test negative.  Discussed clear liquid diet advancing slowly.  Patient has Zofran at home so does not need prescription.  We  discussed return precautions for pain worsening specifically pain migrating to the right lower quadrant or inability tolerate p.o.   Clinical Course as of 09/14/22 [id number]  Sat Sep 14, 2022  0815 EKG 12-Lead [KM]  0955 Preg Test, Ur: Negative [KM]    Clinical Course User Index [KM] Rada Hay, MD     FINAL CLINICAL IMPRESSION(S) / ED DIAGNOSES   Final diagnoses:  Vomiting and diarrhea     Rx / DC Orders   ED Discharge Orders     None        Note:  This document was prepared using Dragon voice recognition software and may include unintentional dictation errors.   Rada Hay, MD 09/14/22 [phone]

## 2022-09-14 NOTE — ED Triage Notes (Signed)
Pt presents via POV with complaints of lower abdominal pain with associated N/V/D that started 2-3 days ago. Pt notes having a fever of 102.7 last night and has taken OTC tylenol without improvement in her sx. Of note, her son had a stomach bug 2 days ago. Denies CP or SOB.

## 2022-09-14 NOTE — Discharge Instructions (Addendum)
You likely have a viral stomach bug causing your vomiting and diarrhea.  Please stick to clear liquids until you are feeling improved then you can advance her diet.  You can take the Zofran that you have at home for nausea and vomiting.  Please return to the emergency department for worsening abdominal pain specifically pain in the right lower part of your abdomen or if you are not able to keep fluids down.

## 2022-10-02 ENCOUNTER — Ambulatory Visit: Payer: Medicaid Other | Admitting: Dermatology

## 2022-10-09 ENCOUNTER — Ambulatory Visit: Payer: Medicaid Other | Admitting: Physician Assistant

## 2022-10-09 ENCOUNTER — Ambulatory Visit (INDEPENDENT_AMBULATORY_CARE_PROVIDER_SITE_OTHER): Payer: Medicaid Other

## 2022-10-09 VITALS — BP 104/70 | Ht 64.0 in | Wt 129.0 lb

## 2022-10-09 DIAGNOSIS — Z3201 Encounter for pregnancy test, result positive: Secondary | ICD-10-CM | POA: Diagnosis not present

## 2022-10-09 DIAGNOSIS — N912 Amenorrhea, unspecified: Secondary | ICD-10-CM

## 2022-10-09 LAB — POCT URINE PREGNANCY: Preg Test, Ur: POSITIVE — AB

## 2022-10-09 NOTE — Patient Instructions (Signed)
Common Medications Safe in Pregnancy  Acne:      Constipation:  Benzoyl Peroxide     Colace  Clindamycin      Dulcolax Suppository  Topica Erythromycin     Fibercon  Salicylic Acid      Metamucil         Miralax AVOID:        Senakot   Accutane    Cough:  Retin-A       Cough Drops  Tetracycline      Phenergan w/ Codeine if Rx  Minocycline      Robitussin (Plain & DM)  Antibiotics:     Crabs/Lice:  Ceclor       RID  Cephalosporins    AVOID:  E-Mycins      Kwell  Keflex  Macrobid/Macrodantin   Diarrhea:  Penicillin      Kao-Pectate  Zithromax      Imodium AD         PUSH FLUIDS AVOID:       Cipro     Fever:  Tetracycline      Tylenol (Regular or Extra  Minocycline       Strength)  Levaquin      Extra Strength-Do not          Exceed 8 tabs/24 hrs Caffeine:        <200mg/day (equiv. To 1 cup of coffee or  approx. 3 12 oz sodas)         Gas: Cold/Hayfever:       Gas-X  Benadryl      Mylicon  Claritin       Phazyme  **Claritin-D        Chlor-Trimeton    Headaches:  Dimetapp      ASA-Free Excedrin  Drixoral-Non-Drowsy     Cold Compress  Mucinex (Guaifenasin)     Tylenol (Regular or Extra  Sudafed/Sudafed-12 Hour     Strength)  **Sudafed PE Pseudoephedrine   Tylenol Cold & Sinus     Vicks Vapor Rub  Zyrtec  **AVOID if Problems With Blood Pressure         Heartburn: Avoid lying down for at least 1 hour after meals  Aciphex      Maalox     Rash:  Milk of Magnesia     Benadryl    Mylanta       1% Hydrocortisone Cream  Pepcid  Pepcid Complete   Sleep Aids:  Prevacid      Ambien   Prilosec       Benadryl  Rolaids       Chamomile Tea  Tums (Limit 4/day)     Unisom         Tylenol PM         Warm milk-add vanilla or  Hemorrhoids:       Sugar for taste  Anusol/Anusol H.C.  (RX: Analapram 2.5%)  Sugar Substitutes:  Hydrocortisone OTC     Ok in moderation  Preparation H      Tucks        Vaseline lotion applied to tissue with  wiping    Herpes:     Throat:  Acyclovir      Oragel  Famvir  Valtrex     Vaccines:         Flu Shot Leg Cramps:       *Gardasil  Benadryl      Hepatitis A         Hepatitis B Nasal Spray:         Pneumovax  Saline Nasal Spray     Polio Booster         Tetanus Nausea:       Tuberculosis test or PPD  Vitamin B6 25 mg TID   AVOID:    Dramamine      *Gardasil  Emetrol       Live Poliovirus  Ginger Root 250 mg QID    MMR (measles, mumps &  High Complex Carbs @ Bedtime    rebella)  Sea Bands-Accupressure    Varicella (Chickenpox)  Unisom 1/2 tab TID     *No known complications           If received before Pain:         Known pregnancy;   Darvocet       Resume series after  Lortab        Delivery  Percocet    Yeast:   Tramadol      Femstat  Tylenol 3      Gyne-lotrimin  Ultram       Monistat  Vicodin           MISC:         All Sunscreens           Hair Coloring/highlights          Insect Repellant's          (Including DEET)         Mystic Tans Commonly Asked Questions During Pregnancy  Cats: A parasite can be excreted in cat feces.  To avoid exposure you need to have another person empty the little box.  If you must empty the litter box you will need to wear gloves.  Wash your hands after handling your cat.  This parasite can also be found in raw or undercooked meat so this should also be avoided.  Colds, Sore Throats, Flu: Please check your medication sheet to see what you can take for symptoms.  If your symptoms are unrelieved by these medications please call the office.  Dental Work: Most any dental work your dentist recommends is permitted.  X-rays should only be taken during the first trimester if absolutely necessary.  Your abdomen should be shielded with a lead apron during all x-rays.  Please notify your provider prior to receiving any x-rays.  Novocaine is fine; gas is not recommended.  If your dentist requires a note from us prior to dental work please call the office  and we will provide one for you.  Exercise: Exercise is an important part of staying healthy during your pregnancy.  You may continue most exercises you were accustomed to prior to pregnancy.  Later in your pregnancy you will most likely notice you have difficulty with activities requiring balance like riding a bicycle.  It is important that you listen to your body and avoid activities that put you at a higher risk of falling.  Adequate rest and staying well hydrated are a must!  If you have questions about the safety of specific activities ask your provider.    Exposure to Children with illness: Try to avoid obvious exposure; report any symptoms to us when noted,  If you have chicken pos, red measles or mumps, you should be immune to these diseases.   Please do not take any vaccines while pregnant unless you have checked with your OB provider.  Fetal Movement: After 28 weeks we recommend you do "kick counts" twice daily.  Lie or sit down in a calm   quiet environment and count your baby movements "kicks".  You should feel your baby at least 10 times per hour.  If you have not felt 10 kicks within the first hour get up, walk around and have something sweet to eat or drink then repeat for an additional hour.  If count remains less than 10 per hour notify your provider.  Fumigating: Follow your pest control agent's advice as to how long to stay out of your home.  Ventilate the area well before re-entering.  Hemorrhoids:   Most over-the-counter preparations can be used during pregnancy.  Check your medication to see what is safe to use.  It is important to use a stool softener or fiber in your diet and to drink lots of liquids.  If hemorrhoids seem to be getting worse please call the office.   Hot Tubs:  Hot tubs Jacuzzis and saunas are not recommended while pregnant.  These increase your internal body temperature and should be avoided.  Intercourse:  Sexual intercourse is safe during pregnancy as long as  you are comfortable, unless otherwise advised by your provider.  Spotting may occur after intercourse; report any bright red bleeding that is heavier than spotting.  Labor:  If you know that you are in labor, please go to the hospital.  If you are unsure, please call the office and let us help you decide what to do.  Lifting, straining, etc:  If your job requires heavy lifting or straining please check with your provider for any limitations.  Generally, you should not lift items heavier than that you can lift simply with your hands and arms (no back muscles)  Painting:  Paint fumes do not harm your pregnancy, but may make you ill and should be avoided if possible.  Latex or water based paints have less odor than oils.  Use adequate ventilation while painting.  Permanents & Hair Color:  Chemicals in hair dyes are not recommended as they cause increase hair dryness which can increase hair loss during pregnancy.  " Highlighting" and permanents are allowed.  Dye may be absorbed differently and permanents may not hold as well during pregnancy.  Sunbathing:  Use a sunscreen, as skin burns easily during pregnancy.  Drink plenty of fluids; avoid over heating.  Tanning Beds:  Because their possible side effects are still unknown, tanning beds are not recommended.  Ultrasound Scans:  Routine ultrasounds are performed at approximately 20 weeks.  You will be able to see your baby's general anatomy an if you would like to know the gender this can usually be determined as well.  If it is questionable when you conceived you may also receive an ultrasound early in your pregnancy for dating purposes.  Otherwise ultrasound exams are not routinely performed unless there is a medical necessity.  Although you can request a scan we ask that you pay for it when conducted because insurance does not cover " patient request" scans.  Work: If your pregnancy proceeds without complications you may work until your due date,  unless your physician or employer advises otherwise.  Round Ligament Pain/Pelvic Discomfort:  Sharp, shooting pains not associated with bleeding are fairly common, usually occurring in the second trimester of pregnancy.  They tend to be worse when standing up or when you remain standing for long periods of time.  These are the result of pressure of certain pelvic ligaments called "round ligaments".  Rest, Tylenol and heat seem to be the most effective relief.  As the  womb and fetus grow, they rise out of the pelvis and the discomfort improves.  Please notify the office if your pain seems different than that described.  It may represent a more serious condition.  First Trimester of Pregnancy  The first trimester of pregnancy starts on the first day of your last menstrual period until the end of week 12. This is months 1 through 3 of pregnancy. A week after a sperm fertilizes an egg, the egg will implant into the wall of the uterus and begin to develop into a baby. By the end of 12 weeks, all the baby's organs will be formed and the baby will be 2-3 inches in size. Body changes during your first trimester Your body goes through many changes during pregnancy. The changes vary and generally return to normal after your baby is born. Physical changes You may gain or lose weight. Your breasts may begin to grow larger and become tender. The tissue that surrounds your nipples (areola) may become darker. Dark spots or blotches (chloasma or mask of pregnancy) may develop on your face. You may have changes in your hair. These can include thickening or thinning of your hair or changes in texture. Health changes You may feel nauseous, and you may vomit. You may have heartburn. You may develop headaches. You may develop constipation. Your gums may bleed and may be sensitive to brushing and flossing. Other changes You may tire easily. You may urinate more often. Your menstrual periods will stop. You may have  a loss of appetite. You may develop cravings for certain kinds of food. You may have changes in your emotions from day to day. You may have more vivid and strange dreams. Follow these instructions at home: Medicines Follow your health care provider's instructions regarding medicine use. Specific medicines may be either safe or unsafe to take during pregnancy. Do not take any medicines unless told to by your health care provider. Take a prenatal vitamin that contains at least 600 micrograms (mcg) of folic acid. Eating and drinking Eat a healthy diet that includes fresh fruits and vegetables, whole grains, good sources of protein such as meat, eggs, or tofu, and low-fat dairy products. Avoid raw meat and unpasteurized juice, milk, and cheese. These carry germs that can harm you and your baby. If you feel nauseous or you vomit: Eat 4 or 5 small meals a day instead of 3 large meals. Try eating a few soda crackers. Drink liquids between meals instead of during meals. You may need to take these actions to prevent or treat constipation: Drink enough fluid to keep your urine pale yellow. Eat foods that are high in fiber, such as beans, whole grains, and fresh fruits and vegetables. Limit foods that are high in fat and processed sugars, such as fried or sweet foods. Activity Exercise only as directed by your health care provider. Most people can continue their usual exercise routine during pregnancy. Try to exercise for 30 minutes at least 5 days a week. Stop exercising if you develop pain or cramping in the lower abdomen or lower back. Avoid exercising if it is very hot or humid or if you are at high altitude. Avoid heavy lifting. If you choose to, you may have sex unless your health care provider tells you not to. Relieving pain and discomfort Wear a good support bra to relieve breast tenderness. Rest with your legs elevated if you have leg cramps or low back pain. If you develop bulging veins  (varicose veins)  in your legs: Wear support hose as told by your health care provider. Elevate your feet for 15 minutes, 3-4 times a day. Limit salt in your diet. Safety Wear your seat belt at all times when driving or riding in a car. Talk with your health care provider if someone is verbally or physically abusive to you. Talk with your health care provider if you are feeling sad or have thoughts of hurting yourself. Lifestyle Do not use hot tubs, steam rooms, or saunas. Do not douche. Do not use tampons or scented sanitary pads. Do not use herbal remedies, alcohol, illegal drugs, or medicines that are not approved by your health care provider. Chemicals in these products can harm your baby. Do not use any products that contain nicotine or tobacco, such as cigarettes, e-cigarettes, and chewing tobacco. If you need help quitting, ask your health care provider. Avoid cat litter boxes and soil used by cats. These carry germs that can cause birth defects in the baby and possibly loss of the unborn baby (fetus) by miscarriage or stillbirth. General instructions During routine prenatal visits in the first trimester, your health care provider will do a physical exam, perform necessary tests, and ask you how things are going. Keep all follow-up visits. This is important. Ask for help if you have counseling or nutritional needs during pregnancy. Your health care provider can offer advice or refer you to specialists for help with various needs. Schedule a dentist appointment. At home, brush your teeth with a soft toothbrush. Floss gently. Write down your questions. Take them to your prenatal visits. Where to find more information American Pregnancy Association: americanpregnancy.[place] and Gynecologists: PoolDevices.com.pt Office on [employer]: KeywordPortfolios.com.br Contact a health care provider if you have: Dizziness. A fever. Mild pelvic  cramps, pelvic pressure, or nagging pain in the abdominal area. Nausea, vomiting, or diarrhea that lasts for 24 hours or longer. A bad-smelling vaginal discharge. Pain when you urinate. Known exposure to a contagious illness, such as chickenpox, measles, Zika virus, HIV, or hepatitis. Get help right away if you have: Spotting or bleeding from your vagina. Severe abdominal cramping or pain. Shortness of breath or chest pain. Any kind of trauma, such as from a fall or a car crash. New or increased pain, swelling, or redness in an arm or leg. Summary The first trimester of pregnancy starts on the first day of your last menstrual period until the end of week 12 (months 1 through 3). Eating 4 or 5 small meals a day rather than 3 large meals may help to relieve nausea and vomiting. Do not use any products that contain nicotine or tobacco, such as cigarettes, e-cigarettes, and chewing tobacco. If you need help quitting, ask your health care provider. Keep all follow-up visits. This is important. This information is not intended to replace advice given to you by your health care provider. Make sure you discuss any questions you have with your health care provider. Document Revised: 12/15/2019 Document Reviewed: 10/21/2019 Elsevier Patient Education  [place].

## 2022-10-09 NOTE — Progress Notes (Signed)
    NURSE VISIT NOTE  Subjective:    Patient ID: Tiffany Guzman, female    DOB: 06-Nov-1995, 27 y.o.   MRN: [id number]  HPI  Patient is a 27 y.o. [id number] female who presents for evaluation of amenorrhea. She believes she could be pregnant. Pregnancy is desired. Sexual Activity: has sex with males. Last period was normal.    Objective:    BP 104/70   Ht 5\' 4"  (1.626 m)   Wt 129 lb (58.5 kg)   LMP 08/31/2022 (Exact Date)   BMI 22.14 kg/m   Lab Review  Results for orders placed or performed in visit on 10/09/22  POCT urine pregnancy  Result Value Ref Range   Preg Test, Ur Positive (A) Negative    Assessment:   1. Amenorrhea     Plan:   Pregnancy Test: Positive  Estimated Date of Delivery: 06/07/23 Encouraged well-balanced diet, plenty of rest when needed, pre-natal vitamins daily and walking for exercise.  Discussed self-help for nausea, avoiding OTC medications until consulting provider or pharmacist, other than Tylenol as needed, minimal caffeine (1-2 cups daily) and avoiding alcohol.   She will schedule her nurse visit @ [redacted] wks pregnant, u/s for dating and labs @10  wk, and NOB visit at [redacted] wk pregnant.    Feel free to call with any questions.   Drenda Freeze, CMA

## 2022-10-23 ENCOUNTER — Emergency Department: Payer: Medicaid Other

## 2022-10-23 ENCOUNTER — Other Ambulatory Visit: Payer: Self-pay

## 2022-10-23 ENCOUNTER — Emergency Department
Admission: EM | Admit: 2022-10-23 | Discharge: 2022-10-23 | Disposition: A | Discharge status: Home / Self Care | Payer: Medicaid Other | Attending: Emergency Medicine | Admitting: Emergency Medicine

## 2022-10-23 DIAGNOSIS — Z3A01 Less than 8 weeks gestation of pregnancy: Secondary | ICD-10-CM | POA: Insufficient documentation

## 2022-10-23 DIAGNOSIS — K219 Gastro-esophageal reflux disease without esophagitis: Secondary | ICD-10-CM | POA: Diagnosis not present

## 2022-10-23 DIAGNOSIS — O99281 Endocrine, nutritional and metabolic diseases complicating pregnancy, first trimester: Secondary | ICD-10-CM | POA: Insufficient documentation

## 2022-10-23 DIAGNOSIS — O99611 Diseases of the digestive system complicating pregnancy, first trimester: Secondary | ICD-10-CM | POA: Insufficient documentation

## 2022-10-23 DIAGNOSIS — E876 Hypokalemia: Secondary | ICD-10-CM | POA: Insufficient documentation

## 2022-10-23 DIAGNOSIS — R1013 Epigastric pain: Secondary | ICD-10-CM

## 2022-10-23 DIAGNOSIS — O26891 Other specified pregnancy related conditions, first trimester: Secondary | ICD-10-CM | POA: Diagnosis present

## 2022-10-23 LAB — COMPREHENSIVE METABOLIC PANEL
ALT: 26 U/L (ref 0–44)
AST: 26 U/L (ref 15–41)
Albumin: 4.1 g/dL (ref 3.5–5.0)
Alkaline Phosphatase: 62 U/L (ref 38–126)
Anion gap: 8 (ref 5–15)
BUN: 8 mg/dL (ref 6–20)
CO2: 26 mmol/L (ref 22–32)
Calcium: 8.7 mg/dL — ABNORMAL LOW (ref 8.9–10.3)
Chloride: 103 mmol/L (ref 98–111)
Creatinine, Ser: 0.55 mg/dL (ref 0.44–1.00)
GFR, Estimated: >60 mL/min (ref >60)
Glucose, Bld: 82 mg/dL (ref 70–99)
Potassium: 3.2 mmol/L — ABNORMAL LOW (ref 3.5–5.1)
Sodium: 137 mmol/L (ref 135–145)
Total Bilirubin: 0.6 mg/dL (ref 0.3–1.2)
Total Protein: 7.5 g/dL (ref 6.5–8.1)

## 2022-10-23 LAB — CBC
HCT: 36.6 % (ref 36.0–46.0)
Hemoglobin: 11.6 g/dL — ABNORMAL LOW (ref 12.0–15.0)
MCH: 27.5 pg (ref 26.0–34.0)
MCHC: 31.7 g/dL (ref 30.0–36.0)
MCV: 86.7 fL (ref 80.0–100.0)
Platelets: 262 10*3/uL (ref 150–400)
RBC: 4.22 MIL/uL (ref 3.87–5.11)
RDW: 13.4 % (ref 11.5–15.5)
WBC: 10 10*3/uL (ref 4.0–10.5)
nRBC: 0 % (ref 0.0–0.2)

## 2022-10-23 LAB — LIPASE, BLOOD: Lipase: 32 U/L (ref 11–51)

## 2022-10-23 LAB — HCG, QUANTITATIVE, PREGNANCY: hCG, Beta Chain, Quant, S: 71575 m[IU]/mL — ABNORMAL HIGH (ref <5)

## 2022-10-23 MED ORDER — DIPHENHYDRAMINE HCL (SLEEP) 25 MG PO TBDP: 25.0000 mg | ORAL_TABLET | Freq: Every evening | ORAL | 2 refills | Status: DC | PRN

## 2022-10-23 MED ORDER — VITAMIN B-6 25 MG PO TABS: 25.0000 mg | ORAL_TABLET | Freq: Four times a day (QID) | ORAL | 0 refills | Status: AC | PRN

## 2022-10-23 MED ORDER — POTASSIUM CHLORIDE CRYS ER 20 MEQ PO TBCR
40.0000 meq | EXTENDED_RELEASE_TABLET | Freq: Once | ORAL | Status: AC
  Administered 2022-10-23: 40 meq via ORAL
  Filled 2022-10-23: qty 2

## 2022-10-23 NOTE — ED Notes (Signed)
Report given to Reina, RN ?

## 2022-10-23 NOTE — ED Provider Notes (Signed)
[facility] Provider Note    Event Date/Time   First MD Initiated Contact with Patient 10/23/22 1530     (approximate)   History   Abdominal Pain   HPI  Tiffany Guzman is a 27 y.o. female G1, P1, who presents to the emergency department with abdominal pain.  Currently [redacted] weeks gestational age based on LMP.  Over the past couple of days intermittent episodes of upper abdominal pain that she describes as a cramping sensation that feels like her muscles are tearing apart.  Denies any significant vomiting but has had ongoing nausea and feeling dizzy.  Denies any significant chest pain but feels like the pain is underneath her breast.  No lower abdominal pain.  No diarrhea or constipation.  No urinary symptoms.  No vaginal bleeding or vaginal discharge.  No pain, nausea or vomiting at this time.     Physical Exam   Triage Vital Signs: ED Triage Vitals  Enc Vitals Group     BP 10/23/22 1348 124/85     Pulse Rate 10/23/22 1348 (!) 102     Resp 10/23/22 1348 18     Temp 10/23/22 1348 98.2 F (36.8 C)     Temp src --      SpO2 10/23/22 1348 100 %     Weight 10/23/22 1350 133 lb (60.3 kg)     Height 10/23/22 1350 5\' 4"  (1.626 m)     Head Circumference --      Peak Flow --      Pain Score 10/23/22 1350 0     Pain Loc --      Pain Edu? --      Excl. in [place]? --     Most recent vital signs: Vitals:   10/23/22 1348  BP: 124/85  Pulse: (!) 102  Resp: 18  Temp: 98.2 F (36.8 C)  SpO2: 100%    Physical Exam Constitutional:      Appearance: She is well-developed.  HENT:     Head: Atraumatic.  Eyes:     Conjunctiva/sclera: Conjunctivae normal.  Cardiovascular:     Rate and Rhythm: Regular rhythm.  Pulmonary:     Effort: No respiratory distress.  Abdominal:     General: There is no distension.     Tenderness: There is generalized abdominal tenderness and tenderness in the epigastric area.  Musculoskeletal:        General: Normal range of  motion.     Cervical back: Normal range of motion.  Skin:    General: Skin is warm.     Capillary Refill: Capillary refill takes less than 2 seconds.  Neurological:     Mental Status: She is alert. Mental status is at baseline.     IMPRESSION / MDM / ASSESSMENT AND PLAN / ED COURSE  I reviewed the triage vital signs and the nursing notes.  Differential diagnosis including pyelonephritis, gastritis/PUD, ectopic pregnancy  No lower abdominal tenderness to palpation of the low suspicion for acute appendicitis.  No right upper quadrant abdominal tenderness.  Negative Murphy sign.   RADIOLOGY I independently reviewed imaging, my interpretation of imaging: Transvaginal ultrasound, ultrasound gallbladder  Ultrasound with no right upper quadrant findings.  Prior cholecystectomy.  Ultrasound showed IUP.  LABS (all labs ordered are listed, but only abnormal results are displayed) Labs interpreted as -    Labs Reviewed  COMPREHENSIVE METABOLIC PANEL - Abnormal; Notable for the following components:      Result Value  Potassium 3.2 (*)    Calcium 8.7 (*)    All other components within normal limits  CBC - Abnormal; Notable for the following components:   Hemoglobin 11.6 (*)    All other components within normal limits  HCG, QUANTITATIVE, PREGNANCY - Abnormal; Notable for the following components:   hCG, Beta Chain, Quant, S 71,575 (*)    All other components within normal limits  LIPASE, BLOOD  URINALYSIS, ROUTINE W REFLEX MICROSCOPIC     MDM    Potassium mildly low at 3.2.  Given oral replacement.  Beta quant is elevated at 71,000.  Lipase normal.  No significant anemia.  No leukocytosis.  Creatinine at baseline.  Ultrasound with IUP.  Abdomen is nontender.  No vaginal bleeding.  Most likely with gastritis/peptic ulcer disease secondary to pregnancy.  Discussed symptomatic treatment.  Will start on nausea medication with B6 and Unisom.  Discussed use of Tums and follow-up with  primary/obstetrician for further management.  Given return precautions   PROCEDURES:  Critical Care performed: No  Procedures  Patient's presentation is most consistent with acute presentation with potential threat to life or bodily function.   MEDICATIONS ORDERED IN ED: Medications  potassium chloride SA (KLOR-CON M) CR tablet 40 mEq (40 mEq Oral Given 10/23/22 1603)    FINAL CLINICAL IMPRESSION(S) / ED DIAGNOSES   Final diagnoses:  Epigastric pain  Less than [redacted] weeks gestation of pregnancy  Gastroesophageal reflux disease without esophagitis  Hypokalemia     Rx / DC Orders   ED Discharge Orders          Ordered    pyridOXINE (VITAMIN B6) 25 MG tablet  Every 6 hours PRN        10/23/22 1750    diphenhydrAMINE HCl, Sleep, 25 MG TBDP  At bedtime PRN        10/23/22 1750             Note:  This document was prepared using Dragon voice recognition software and may include unintentional dictation errors.   Nathaniel Man, MD 10/23/22 1750

## 2022-10-23 NOTE — ED Triage Notes (Signed)
Pt to ED for generalized abd pain that started yesterday, [redacted] weeks pregnant. States feels like someone is stretching her belly and pulling. Denies n/v/d. Ambulatory to triage. NAD noted. Denies pain at this time

## 2022-10-23 NOTE — Discharge Instructions (Addendum)
You were seen in the emergency department for abdominal pain.  You had an ultrasound that shows an early pregnancy measuring 6 weeks and 6 days.  Your potassium was mildly low today.  It is important that you eat foods that are high in potassium.  Concern that you are having acid reflux and GERD, we discussed small frequent foods, not laying down for approximately 1 hour after eating.  Follow-up with your primary care physician.

## 2022-10-23 NOTE — ED Notes (Addendum)
Pt states being 7.[redacted] weeks pregnant and yesterday she started to have a tearing sensation in her abdomen. Pt walking with no deficits. Pt alert and oriented, currently complaining of no pain.  As per EDP, no FHT needed to be attempted at this time.

## 2022-10-30 ENCOUNTER — Encounter: Payer: Self-pay | Admitting: Family Medicine

## 2022-10-30 ENCOUNTER — Ambulatory Visit (INDEPENDENT_AMBULATORY_CARE_PROVIDER_SITE_OTHER): Payer: Medicaid Other

## 2022-10-30 VITALS — Wt 138.0 lb

## 2022-10-30 DIAGNOSIS — Z3689 Encounter for other specified antenatal screening: Secondary | ICD-10-CM

## 2022-10-30 DIAGNOSIS — Z369 Encounter for antenatal screening, unspecified: Secondary | ICD-10-CM

## 2022-10-30 DIAGNOSIS — Z3480 Encounter for supervision of other normal pregnancy, unspecified trimester: Secondary | ICD-10-CM | POA: Insufficient documentation

## 2022-10-30 NOTE — Progress Notes (Signed)
New OB Intake  I connected with  Tiffany Guzman on 10/30/22 at  9:15 AM EDT by telephone and verified that I am speaking with the correct person using two identifiers. Nurse is located at [facility] and pt is located at home.  I explained I am completing New OB Intake today. We discussed her EDD of 06/07/2023 that is based on LMP of 08/31/2022. Pt is G2/P1001. I reviewed her allergies, medications, Medical/Surgical/OB history, and appropriate screenings. There are  nocats in the home.  Based on history, this is a/an pregnancy uncomplicated .   Patient Active Problem List   Diagnosis Date Noted   Supervision of other normal pregnancy, antepartum 10/30/2022   Cannabis abuse with other cannabis-induced disorder 11/28/2021   Current moderate episode of major depressive disorder without prior episode 11/28/2021   IBS (irritable bowel syndrome) 03/15/2021   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   Bradycardia 02/26/2021   Right ovarian cyst 02/25/2021   Leukocytosis 02/25/2021   Status post ovarian cystectomy 02/25/2021   S/P cholecystectomy    Status post cesarean delivery 11/14/2017   Dermoid cyst 10/03/2017   Dermoid cyst of left ovary 10/01/2017   Anxiety disorder 06/24/2016    Concerns addressed today Wasn't allowed to see anything on u/s done at hosp; desires another one.  Adv will send msg to schedulers to schedule and for pt to watch for their call.  Delivery Plans:  Plans to deliver at [facility].  Anatomy US Explained first scheduled [place] will be scheduled soon and an anatomy scan will be done at 20 weeks.  Labs Discussed genetic screening with patient. Patient desires genetic testing to be drawn with new Ob labs. Discussed possible labs to be drawn at new OB appointment.  COVID Vaccine Patient has not had COVID vaccine.   Social Determinants of Health Food Insecurity: expresses food insecurity. Information given on local food banks. WIC Referral:  Patient is interested in referral to [facility]. Has food stamps Transportation: Patient denies transportation needs. Childcare: Discussed no children allowed at ultrasound appointments.   First visit review I reviewed new OB appt with pt. I explained she will have ob bloodwork and pap smear/pelvic exam if indicated. Explained pt will be seen by Dr. Brennan Bailey at first visit; encounter routed to appropriate provider.   Loran Senters, [facility] 10/30/2022  9:44 AM

## 2022-10-30 NOTE — Patient Instructions (Signed)
First Trimester of Pregnancy  The first trimester of pregnancy starts on the first day of your last menstrual period until the end of week 12. This is also called months 1 through 3 of pregnancy. Body changes during your first trimester Your body goes through many changes during pregnancy. The changes usually return to normal after your baby is born. Physical changes You may gain or lose weight. Your breasts may grow larger and hurt. The area around your nipples may get darker. Dark spots or blotches may develop on your face. You may have changes in your hair. Health changes You may feel like you might vomit (nauseous), and you may vomit. You may have heartburn. You may have headaches. You may have trouble pooping (constipation). Your gums may bleed. Other changes You may get tired easily. You may pee (urinate) more often. Your menstrual periods will stop. You may not feel hungry. You may want to eat certain kinds of food. You may have changes in your emotions from day to day. You may have more dreams. Follow these instructions at home: Medicines Take over-the-counter and prescription medicines only as told by your doctor. Some medicines are not safe during pregnancy. Take a prenatal vitamin that contains at least 600 micrograms (mcg) of folic acid. Eating and drinking Eat healthy meals that include: Fresh fruits and vegetables. Whole grains. Good sources of protein, such as meat, eggs, or tofu. Low-fat dairy products. Avoid raw meat and unpasteurized juice, milk, and cheese. If you feel like you may vomit, or you vomit: Eat 4 or 5 small meals a day instead of 3 large meals. Try eating a few soda crackers. Drink liquids between meals instead of during meals. You may need to take these actions to prevent or treat trouble pooping: Drink enough fluids to keep your pee (urine) pale yellow. Eat foods that are high in fiber. These include beans, whole grains, and fresh fruits and  vegetables. Limit foods that are high in fat and sugar. These include fried or sweet foods. Activity Exercise only as told by your doctor. Most people can do their usual exercise routine during pregnancy. Stop exercising if you have cramps or pain in your lower belly (abdomen) or low back. Do not exercise if it is too hot or too humid, or if you are in a place of great height (high altitude). Avoid heavy lifting. If you choose to, you may have sex unless your doctor tells you not to. Relieving pain and discomfort Wear a good support bra if your breasts are sore. Rest with your legs raised (elevated) if you have leg cramps or low back pain. If you have bulging veins (varicose veins) in your legs: Wear support hose as told by your doctor. Raise your feet for 15 minutes, 3-4 times a day. Limit salt in your food. Safety Wear your seat belt at all times when you are in a car. Talk with your doctor if someone is hurting you or yelling at you. Talk with your doctor if you are feeling sad or have thoughts of hurting yourself. Lifestyle Do not use hot tubs, steam rooms, or saunas. Do not douche. Do not use tampons or scented sanitary pads. Do not use herbal medicines, illegal drugs, or medicines that are not approved by your doctor. Do not drink alcohol. Do not smoke or use any products that contain nicotine or tobacco. If you need help quitting, ask your doctor. Avoid cat litter boxes and soil that is used by cats. These carry   germs that can cause harm to the baby and can cause a loss of your baby by miscarriage or stillbirth. General instructions Keep all follow-up visits. This is important. Ask for help if you need counseling or if you need help with nutrition. Your doctor can give you advice or tell you where to go for help. Visit your dentist. At home, brush your teeth with a soft toothbrush. Floss gently. Write down your questions. Take them to your prenatal visits. Where to find more  information American Pregnancy Association: americanpregnancy.org American College of Obstetricians and Gynecologists: www.acog.org Office on Women's Health: womenshealth.gov/pregnancy Contact a doctor if: You are dizzy. You have a fever. You have mild cramps or pressure in your lower belly. You have a nagging pain in your belly area. You continue to feel like you may vomit, you vomit, or you have watery poop (diarrhea) for 24 hours or longer. You have a bad-smelling fluid coming from your vagina. You have pain when you pee. You are exposed to a disease that spreads from person to person, such as chickenpox, measles, Zika virus, HIV, or hepatitis. Get help right away if: You have spotting or bleeding from your vagina. You have very bad belly cramping or pain. You have shortness of breath or chest pain. You have any kind of injury, such as from a fall or a car crash. You have new or increased pain, swelling, or redness in an arm or leg. Summary The first trimester of pregnancy starts on the first day of your last menstrual period until the end of week 12 (months 1 through 3). Eat 4 or 5 small meals a day instead of 3 large meals. Do not smoke or use any products that contain nicotine or tobacco. If you need help quitting, ask your doctor. Keep all follow-up visits. This information is not intended to replace advice given to you by your health care provider. Make sure you discuss any questions you have with your health care provider. Document Revised: 12/15/2019 Document Reviewed: 10/21/2019 Elsevier Patient Education  2023 Elsevier Inc. Commonly Asked Questions During Pregnancy  Cats: A parasite can be excreted in cat feces.  To avoid exposure you need to have another person empty the little box.  If you must empty the litter box you will need to wear gloves.  Wash your hands after handling your cat.  This parasite can also be found in raw or undercooked meat so this should also be  avoided.  Colds, Sore Throats, Flu: Please check your medication sheet to see what you can take for symptoms.  If your symptoms are unrelieved by these medications please call the office.  Dental Work: Most any dental work your dentist recommends is permitted.  X-rays should only be taken during the first trimester if absolutely necessary.  Your abdomen should be shielded with a lead apron during all x-rays.  Please notify your provider prior to receiving any x-rays.  Novocaine is fine; gas is not recommended.  If your dentist requires a note from us prior to dental work please call the office and we will provide one for you.  Exercise: Exercise is an important part of staying healthy during your pregnancy.  You may continue most exercises you were accustomed to prior to pregnancy.  Later in your pregnancy you will most likely notice you have difficulty with activities requiring balance like riding a bicycle.  It is important that you listen to your body and avoid activities that put you at a higher   risk of falling.  Adequate rest and staying well hydrated are a must!  If you have questions about the safety of specific activities ask your provider.    Exposure to Children with illness: Try to avoid obvious exposure; report any symptoms to us when noted,  If you have chicken pos, red measles or mumps, you should be immune to these diseases.   Please do not take any vaccines while pregnant unless you have checked with your OB provider.  Fetal Movement: After 28 weeks we recommend you do "kick counts" twice daily.  Lie or sit down in a calm quiet environment and count your baby movements "kicks".  You should feel your baby at least 10 times per hour.  If you have not felt 10 kicks within the first hour get up, walk around and have something sweet to eat or drink then repeat for an additional hour.  If count remains less than 10 per hour notify your provider.  Fumigating: Follow your pest control agent's  advice as to how long to stay out of your home.  Ventilate the area well before re-entering.  Hemorrhoids:   Most over-the-counter preparations can be used during pregnancy.  Check your medication to see what is safe to use.  It is important to use a stool softener or fiber in your diet and to drink lots of liquids.  If hemorrhoids seem to be getting worse please call the office.   Hot Tubs:  Hot tubs Jacuzzis and saunas are not recommended while pregnant.  These increase your internal body temperature and should be avoided.  Intercourse:  Sexual intercourse is safe during pregnancy as long as you are comfortable, unless otherwise advised by your provider.  Spotting may occur after intercourse; report any bright red bleeding that is heavier than spotting.  Labor:  If you know that you are in labor, please go to the hospital.  If you are unsure, please call the office and let us help you decide what to do.  Lifting, straining, etc:  If your job requires heavy lifting or straining please check with your provider for any limitations.  Generally, you should not lift items heavier than that you can lift simply with your hands and arms (no back muscles)  Painting:  Paint fumes do not harm your pregnancy, but may make you ill and should be avoided if possible.  Latex or water based paints have less odor than oils.  Use adequate ventilation while painting.  Permanents & Hair Color:  Chemicals in hair dyes are not recommended as they cause increase hair dryness which can increase hair loss during pregnancy.  " Highlighting" and permanents are allowed.  Dye may be absorbed differently and permanents may not hold as well during pregnancy.  Sunbathing:  Use a sunscreen, as skin burns easily during pregnancy.  Drink plenty of fluids; avoid over heating.  Tanning Beds:  Because their possible side effects are still unknown, tanning beds are not recommended.  Ultrasound Scans:  Routine ultrasounds are performed  at approximately 20 weeks.  You will be able to see your baby's general anatomy an if you would like to know the gender this can usually be determined as well.  If it is questionable when you conceived you may also receive an ultrasound early in your pregnancy for dating purposes.  Otherwise ultrasound exams are not routinely performed unless there is a medical necessity.  Although you can request a scan we ask that you pay for it when   conducted because insurance does not cover " patient request" scans.  Work: If your pregnancy proceeds without complications you may work until your due date, unless your physician or employer advises otherwise.  Round Ligament Pain/Pelvic Discomfort:  Sharp, shooting pains not associated with bleeding are fairly common, usually occurring in the second trimester of pregnancy.  They tend to be worse when standing up or when you remain standing for long periods of time.  These are the result of pressure of certain pelvic ligaments called "round ligaments".  Rest, Tylenol and heat seem to be the most effective relief.  As the womb and fetus grow, they rise out of the pelvis and the discomfort improves.  Please notify the office if your pain seems different than that described.  It may represent a more serious condition.  Common Medications Safe in Pregnancy  Acne:      Constipation:  Benzoyl Peroxide     Colace  Clindamycin      Dulcolax Suppository  Topica Erythromycin     Fibercon  Salicylic Acid      Metamucil         Miralax AVOID:        Senakot   Accutane    Cough:  Retin-A       Cough Drops  Tetracycline      Phenergan w/ Codeine if Rx  Minocycline      Robitussin (Plain & DM)  Antibiotics:     Crabs/Lice:  Ceclor       RID  Cephalosporins    AVOID:  E-Mycins      Kwell  Keflex  Macrobid/Macrodantin   Diarrhea:  Penicillin      Kao-Pectate  Zithromax      Imodium AD         PUSH FLUIDS AVOID:       Cipro     Fever:  Tetracycline      Tylenol (Regular  or Extra  Minocycline       Strength)  Levaquin      Extra Strength-Do not          Exceed 8 tabs/24 hrs Caffeine:        <200mg/day (equiv. To 1 cup of coffee or  approx. 3 12 oz sodas)         Gas: Cold/Hayfever:       Gas-X  Benadryl      Mylicon  Claritin       Phazyme  **Claritin-D        Chlor-Trimeton    Headaches:  Dimetapp      ASA-Free Excedrin  Drixoral-Non-Drowsy     Cold Compress  Mucinex (Guaifenasin)     Tylenol (Regular or Extra  Sudafed/Sudafed-12 Hour     Strength)  **Sudafed PE Pseudoephedrine   Tylenol Cold & Sinus     Vicks Vapor Rub  Zyrtec  **AVOID if Problems With Blood Pressure         Heartburn: Avoid lying down for at least 1 hour after meals  Aciphex      Maalox     Rash:  Milk of Magnesia     Benadryl    Mylanta       1% Hydrocortisone Cream  Pepcid  Pepcid Complete   Sleep Aids:  Prevacid      Ambien   Prilosec       Benadryl  Rolaids       Chamomile Tea  Tums (Limit 4/day)     Unisom           Tylenol PM         Warm milk-add vanilla or  Hemorrhoids:       Sugar for taste  Anusol/Anusol H.C.  (RX: Analapram 2.5%)  Sugar Substitutes:  Hydrocortisone OTC     Ok in moderation  Preparation H      Tucks        Vaseline lotion applied to tissue with wiping    Herpes:     Throat:  Acyclovir      Oragel  Famvir  Valtrex     Vaccines:         Flu Shot Leg Cramps:       *Gardasil  Benadryl      Hepatitis A         Hepatitis B Nasal Spray:       Pneumovax  Saline Nasal Spray     Polio Booster         Tetanus Nausea:       Tuberculosis test or PPD  Vitamin B6 25 mg TID   AVOID:    Dramamine      *Gardasil  Emetrol       Live Poliovirus  Ginger Root 250 mg QID    MMR (measles, mumps &  High Complex Carbs @ Bedtime    rebella)  Sea Bands-Accupressure    Varicella (Chickenpox)  Unisom 1/2 tab TID     *No known complications           If received before Pain:         Known pregnancy;   Darvocet       Resume series  after  Lortab        Delivery  Percocet    Yeast:   Tramadol      Femstat  Tylenol 3      Gyne-lotrimin  Ultram       Monistat  Vicodin           MISC:         All Sunscreens           Hair Coloring/highlights          Insect Repellant's          (Including DEET)         Mystic Tans  

## 2022-10-30 NOTE — Progress Notes (Signed)
Patient has been contacted and provided the number to centralized scheduling to schedule [place].  Thanks

## 2022-11-01 ENCOUNTER — Encounter: Payer: Self-pay | Admitting: Family Medicine

## 2022-11-01 ENCOUNTER — Ambulatory Visit: Payer: Medicaid Other | Admitting: Family Medicine

## 2022-11-01 VITALS — BP 122/74 | HR 96 | Temp 97.9°F | Resp 16 | Ht 64.0 in | Wt 138.0 lb

## 2022-11-01 DIAGNOSIS — Z0289 Encounter for other administrative examinations: Secondary | ICD-10-CM

## 2022-11-01 DIAGNOSIS — Z3480 Encounter for supervision of other normal pregnancy, unspecified trimester: Secondary | ICD-10-CM | POA: Diagnosis not present

## 2022-11-01 DIAGNOSIS — O26819 Pregnancy related exhaustion and fatigue, unspecified trimester: Secondary | ICD-10-CM

## 2022-11-01 NOTE — Progress Notes (Unsigned)
Patient ID: Tiffany Guzman, female    DOB: [birth year], 27 y.o.   MRN: [id number]  PCP: [name], PA-C  Chief Complaint  Patient presents with   Letter for School/Work    able to sit down at work due to pregnancy    Subjective:   Tiffany Guzman is a 27 y.o. female, presents to clinic with CC of the following:  HPI  Pt is rougly 8-9 w EGA, due Nov 16th, is presenting with pregnancy related fatigue, intermittent but mild nausea, and some joint pain that is worse when at work.  She is completing all of her work but has increased pain, fatigue, near syncope episodes when she is required to stand for the duration of her work shift - usually 6+ hours w/o breaks or ability to eat/drink/sit or rest.  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  She is est with OBGYN    Patient Active Problem List   Diagnosis Date Noted   Supervision of other normal pregnancy, antepartum 10/30/2022   Cannabis abuse with other cannabis-induced disorder 11/28/2021   Current moderate episode of major depressive disorder without prior episode 11/28/2021   IBS (irritable bowel syndrome) 03/15/2021   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   Bradycardia 02/26/2021   Right ovarian cyst 02/25/2021   Leukocytosis 02/25/2021   Status post ovarian cystectomy 02/25/2021   S/P cholecystectomy    Status post cesarean delivery 11/14/2017   Dermoid cyst 10/03/2017   Dermoid cyst of left ovary 10/01/2017   Anxiety disorder 06/24/2016      Current Outpatient Medications:    doxylamine, Sleep, (UNISOM) 25 MG tablet, Take 10 mg by mouth at bedtime as needed., Disp: , Rfl:    Prenatal Vit-Fe Fumarate-FA (PRENATAL PO), Take by mouth., Disp: , Rfl:     pyridOXINE (VITAMIN B6) 25 MG tablet, Take 1 tablet (25 mg total) by mouth every 6 (six) hours as needed (nausea/vomiting)., Disp: 60 tablet, Rfl: 0   Allergies  Allergen Reactions   Buspirone Anaphylaxis and Other (See Comments)    Dystonic reaction - arms, legs, jaw "locked up"  "lockjaw"  Jittery, lockjaw, tightened muscles     Social History   Tobacco Use   Smoking status: Never   Smokeless tobacco: Never  Vaping Use   Vaping Use: Former   Start date: 09/20/2019   Quit date: 10/16/2022   Substances: Nicotine, Flavoring  Substance Use Topics   Alcohol use: No    Alcohol/week: 0.0 standard drinks of alcohol   Drug use: Not Currently    Frequency: 7.0 times per week    Types: Marijuana    Comment: has stopped for 10 days      Chart Review Today: I personally reviewed active problem list, medication list, allergies, family history, social history, health maintenance, notes from last encounter, lab results, imaging with the patient/caregiver today.   Review of Systems  Constitutional: Negative.   HENT: Negative.    Eyes: Negative.   Respiratory: Negative.    Cardiovascular: Negative.   Gastrointestinal: Negative.   Endocrine: Negative.   Genitourinary: Negative.   Musculoskeletal: Negative.   Skin: Negative.   Allergic/Immunologic: Negative.   Neurological: Negative.   Hematological: Negative.  Psychiatric/Behavioral: Negative.    All other systems reviewed and are negative.      Objective:   Vitals:   11/01/22 1323  BP: 122/74  Pulse: 96  Resp: 16  Temp: 97.9 F (36.6 C)  TempSrc: Oral  SpO2: 100%  Weight: 138 lb (62.6 kg)  Height: 5\' 4"  (1.626 m)    Body mass index is 23.69 kg/m.  Physical Exam Vitals and nursing note reviewed.  Constitutional:      General: She is not in acute distress.    Appearance: Normal appearance. She is well-developed. She is not ill-appearing, toxic-appearing or diaphoretic.  HENT:     Head: Normocephalic and  atraumatic.     Nose: Nose normal.  Eyes:     General:        Right eye: No discharge.        Left eye: No discharge.     Conjunctiva/sclera: Conjunctivae normal.  Neck:     Trachea: No tracheal deviation.  Cardiovascular:     Rate and Rhythm: Normal rate and regular rhythm.  Pulmonary:     Effort: Pulmonary effort is normal. No respiratory distress.     Breath sounds: No stridor.  Musculoskeletal:        General: Normal range of motion.  Skin:    General: Skin is warm and dry.     Findings: No rash.  Neurological:     Mental Status: She is alert.     Motor: No abnormal muscle tone.     Coordination: Coordination normal.  Psychiatric:        Mood and Affect: Mood normal.        Behavior: Behavior normal.      Results for orders placed or performed during the hospital encounter of 10/23/22  Lipase, blood  Result Value Ref Range   Lipase 32 11 - 51 U/L  Comprehensive metabolic panel  Result Value Ref Range   Sodium 137 135 - 145 mmol/L   Potassium 3.2 (L) 3.5 - 5.1 mmol/L   Chloride 103 98 - 111 mmol/L   CO2 26 22 - 32 mmol/L   Glucose, Bld 82 70 - 99 mg/dL   BUN 8 6 - 20 mg/dL   Creatinine, Ser 1.10 0.44 - 1.00 mg/dL   Calcium 8.7 (L) 8.9 - 10.3 mg/dL   Total Protein 7.5 6.5 - 8.1 g/dL   Albumin 4.1 3.5 - 5.0 g/dL   AST 26 15 - 41 U/L   ALT 26 0 - 44 U/L   Alkaline Phosphatase 62 38 - 126 U/L   Total Bilirubin 0.6 0.3 - 1.2 mg/dL   GFR, Estimated >21 >11 mL/min   Anion gap 8 5 - 15  CBC  Result Value Ref Range   WBC 10.0 4.0 - 10.5 K/uL   RBC 4.22 3.87 - 5.11 MIL/uL   Hemoglobin 11.6 (L) 12.0 - 15.0 g/dL   HCT 73.5 67.0 - 14.1 %   MCV 86.7 80.0 - 100.0 fL   MCH 27.5 26.0 - 34.0 pg   MCHC 31.7 30.0 - 36.0 g/dL   RDW 03.0 13.1 - 43.8 %   Platelets 262 150 - 400 K/uL   nRBC 0.0 0.0 - 0.2 %  hCG, quantitative, pregnancy  Result Value Ref Range   hCG, Beta Chain, Quant, S 71,575 (H) <5 mIU/mL       Assessment & Plan:     ICD-10-CM   1. Encounter for  completion of form with patient  Z02.89  letter written for reasonable accomodations due to medical conditions and symptoms    2. Supervision of other normal pregnancy, antepartum  Z34.80    overall doing well, est with OBGYN, reviewed most recent notes    3. 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  Pt encouraged to reach out to HR for process to do formal accommodations for work          [name], PA-C 11/01/22 1:38 PM

## 2022-11-04 NOTE — Telephone Encounter (Signed)
The patient called and is aware of schedule adjustment.

## 2022-11-11 ENCOUNTER — Ambulatory Visit
Admission: RE | Admit: 2022-11-11 | Discharge: 2022-11-11 | Disposition: A | Discharge status: Home / Self Care | Payer: Medicaid Other | Source: Ambulatory Visit | Attending: Obstetrics and Gynecology | Admitting: Obstetrics and Gynecology

## 2022-11-11 ENCOUNTER — Other Ambulatory Visit: Payer: Self-pay | Admitting: Obstetrics and Gynecology

## 2022-11-11 DIAGNOSIS — Z369 Encounter for antenatal screening, unspecified: Secondary | ICD-10-CM

## 2022-11-11 DIAGNOSIS — Z3480 Encounter for supervision of other normal pregnancy, unspecified trimester: Secondary | ICD-10-CM | POA: Insufficient documentation

## 2022-11-11 DIAGNOSIS — Z3A09 9 weeks gestation of pregnancy: Secondary | ICD-10-CM | POA: Insufficient documentation

## 2022-11-11 DIAGNOSIS — Z3689 Encounter for other specified antenatal screening: Secondary | ICD-10-CM | POA: Insufficient documentation

## 2022-11-11 DIAGNOSIS — N8311 Corpus luteum cyst of right ovary: Secondary | ICD-10-CM | POA: Diagnosis not present

## 2022-11-11 DIAGNOSIS — O3481 Maternal care for other abnormalities of pelvic organs, first trimester: Secondary | ICD-10-CM | POA: Insufficient documentation

## 2022-11-18 ENCOUNTER — Other Ambulatory Visit (HOSPITAL_COMMUNITY)
Admission: RE | Admit: 2022-11-18 | Discharge: 2022-11-18 | Disposition: A | Discharge status: Home / Self Care | Payer: Medicaid Other | Source: Ambulatory Visit | Attending: Obstetrics and Gynecology | Admitting: Obstetrics and Gynecology

## 2022-11-18 ENCOUNTER — Other Ambulatory Visit: Payer: Medicaid Other

## 2022-11-18 DIAGNOSIS — Z3480 Encounter for supervision of other normal pregnancy, unspecified trimester: Secondary | ICD-10-CM | POA: Diagnosis present

## 2022-11-18 DIAGNOSIS — Z369 Encounter for antenatal screening, unspecified: Secondary | ICD-10-CM | POA: Insufficient documentation

## 2022-11-19 LAB — URINALYSIS, ROUTINE W REFLEX MICROSCOPIC
Bilirubin, UA: NEGATIVE
Glucose, UA: NEGATIVE
Ketones, UA: NEGATIVE
Nitrite, UA: NEGATIVE
Protein,UA: NEGATIVE
RBC, UA: NEGATIVE
Specific Gravity, UA: 1.02 (ref 1.005–1.030)
Urobilinogen, Ur: 0.2 mg/dL (ref 0.2–1.0)
pH, UA: 7 (ref 5.0–7.5)

## 2022-11-19 LAB — MICROSCOPIC EXAMINATION: Casts: NONE SEEN /lpf

## 2022-11-20 LAB — URINE CULTURE, OB REFLEX

## 2022-11-20 LAB — MONITOR DRUG PROFILE 14(MW)
Amphetamine Scrn, Ur: NEGATIVE ng/mL
BARBITURATE SCREEN URINE: NEGATIVE ng/mL
BENZODIAZEPINE SCREEN, URINE: NEGATIVE ng/mL
Buprenorphine, Urine: NEGATIVE ng/mL
CANNABINOIDS UR QL SCN: NEGATIVE ng/mL
Cocaine (Metab) Scrn, Ur: NEGATIVE ng/mL
Creatinine(Crt), U: 84.5 mg/dL (ref 20.0–300.0)
Fentanyl, Urine: NEGATIVE pg/mL
Meperidine Screen, Urine: NEGATIVE ng/mL
Methadone Screen, Urine: NEGATIVE ng/mL
OXYCODONE+OXYMORPHONE UR QL SCN: NEGATIVE ng/mL
Opiate Scrn, Ur: NEGATIVE ng/mL
Ph of Urine: 6.7 (ref 4.5–8.9)
Phencyclidine Qn, Ur: NEGATIVE ng/mL
Propoxyphene Scrn, Ur: NEGATIVE ng/mL
SPECIFIC GRAVITY: 1.02
Tramadol Screen, Urine: NEGATIVE ng/mL

## 2022-11-20 LAB — CULTURE, OB URINE

## 2022-11-20 LAB — URINE CYTOLOGY ANCILLARY ONLY
Chlamydia: NEGATIVE
Comment: NEGATIVE
Comment: NORMAL
Neisseria Gonorrhea: NEGATIVE

## 2022-11-20 LAB — NICOTINE SCREEN, URINE: Cotinine Ql Scrn, Ur: NEGATIVE ng/mL

## 2022-11-23 LAB — CBC/D/PLT+RPR+RH+ABO+RUBIGG...
Antibody Screen: NEGATIVE
Basophils Absolute: 0 10*3/uL (ref 0.0–0.2)
Basos: 0 %
EOS (ABSOLUTE): 0.1 10*3/uL (ref 0.0–0.4)
Eos: 1 %
HCV Ab: NONREACTIVE
HIV Screen 4th Generation wRfx: NONREACTIVE
Hematocrit: 37.5 % (ref 34.0–46.6)
Hemoglobin: 12.1 g/dL (ref 11.1–15.9)
Hepatitis B Surface Ag: NEGATIVE
Immature Grans (Abs): 0 10*3/uL (ref 0.0–0.1)
Immature Granulocytes: 0 %
Lymphocytes Absolute: 1.5 10*3/uL (ref 0.7–3.1)
Lymphs: 17 %
MCH: 27.4 pg (ref 26.6–33.0)
MCHC: 32.3 g/dL (ref 31.5–35.7)
MCV: 85 fL (ref 79–97)
Monocytes Absolute: 0.5 10*3/uL (ref 0.1–0.9)
Monocytes: 6 %
Neutrophils Absolute: 6.6 10*3/uL (ref 1.4–7.0)
Neutrophils: 76 %
Platelets: 260 10*3/uL (ref 150–450)
RBC: 4.41 x10E6/uL (ref 3.77–5.28)
RDW: 13.1 % (ref 11.7–15.4)
RPR Ser Ql: NONREACTIVE
Rh Factor: POSITIVE
Rubella Antibodies, IGG: 0.94 index — ABNORMAL LOW (ref >0.99)
Varicella zoster IgG: 522 index (ref >165)
WBC: 8.7 10*3/uL (ref 3.4–10.8)

## 2022-11-23 LAB — HCV INTERPRETATION

## 2022-11-25 LAB — MATERNIT 21 PLUS CORE, BLOOD
Fetal Fraction: 9
Result (T21): NEGATIVE
Trisomy 13 (Patau syndrome): NEGATIVE
Trisomy 18 (Edwards syndrome): NEGATIVE
Trisomy 21 (Down syndrome): NEGATIVE

## 2022-11-26 ENCOUNTER — Encounter: Payer: Medicaid Other | Admitting: Obstetrics and Gynecology

## 2022-12-05 ENCOUNTER — Encounter: Payer: Self-pay | Admitting: Obstetrics and Gynecology

## 2022-12-05 ENCOUNTER — Other Ambulatory Visit (HOSPITAL_COMMUNITY)
Admission: RE | Admit: 2022-12-05 | Discharge: 2022-12-05 | Disposition: A | Discharge status: Home / Self Care | Payer: Medicaid Other | Source: Ambulatory Visit | Attending: Obstetrics and Gynecology | Admitting: Obstetrics and Gynecology

## 2022-12-05 ENCOUNTER — Ambulatory Visit (INDEPENDENT_AMBULATORY_CARE_PROVIDER_SITE_OTHER): Payer: Medicaid Other | Admitting: Obstetrics and Gynecology

## 2022-12-05 VITALS — BP 114/75 | HR 97 | Ht 64.0 in | Wt 146.3 lb

## 2022-12-05 DIAGNOSIS — Z3481 Encounter for supervision of other normal pregnancy, first trimester: Secondary | ICD-10-CM | POA: Diagnosis not present

## 2022-12-05 DIAGNOSIS — Z124 Encounter for screening for malignant neoplasm of cervix: Secondary | ICD-10-CM

## 2022-12-05 DIAGNOSIS — Z3A13 13 weeks gestation of pregnancy: Secondary | ICD-10-CM

## 2022-12-05 DIAGNOSIS — Z3482 Encounter for supervision of other normal pregnancy, second trimester: Secondary | ICD-10-CM

## 2022-12-05 LAB — POCT URINALYSIS DIPSTICK OB
Bilirubin, UA: NEGATIVE
Blood, UA: NEGATIVE
Glucose, UA: NEGATIVE
Ketones, UA: NEGATIVE
Leukocytes, UA: NEGATIVE
Nitrite, UA: NEGATIVE
POC,PROTEIN,UA: NEGATIVE
Spec Grav, UA: 1.015 (ref 1.010–1.025)
Urobilinogen, UA: 0.2 E.U./dL
pH, UA: 6.5 (ref 5.0–8.0)

## 2022-12-05 NOTE — Progress Notes (Signed)
NOB: Generally doing well.  Reports that she has been overeating and gaining significant amounts of weight but she reports that she is "hungry all the time".  We have discussed limiting caloric intake and reduction in sodas which have previously been a big part of her diet.  She had a previous cesarean delivery with her last pregnancy and she desires a repeat cesarean delivery.  MaterniT 21 done-normal.  Pap performed today.  Consider AFP next visit.  Physical examination General NAD, Conversant  HEENT Atraumatic; Op clear with mmm.  Normo-cephalic. Pupils reactive. Anicteric sclerae  Thyroid/Neck Smooth without nodularity or enlargement. Normal ROM.  Neck Supple.  Skin No rashes, lesions or ulceration. Normal palpated skin turgor. No nodularity.  Breasts: No masses or discharge.  Symmetric.  No axillary adenopathy.  Lungs: Clear to auscultation.No rales or wheezes. Normal Respiratory effort, no retractions.  Heart: NSR.  No murmurs or rubs appreciated. No peripheral edema  Abdomen: Soft.  Non-tender.  No masses.  No HSM. No hernia  Extremities: Moves all appropriately.  Normal ROM for age. No lymphadenopathy.  Neuro: Oriented to PPT.  Normal mood. Normal affect.     Pelvic:   Vulva: Normal appearance.  No lesions.  Vagina: No lesions or abnormalities noted.  Support: Normal pelvic support.  Urethra No masses tenderness or scarring.  Meatus Normal size without lesions or prolapse.  Cervix: Normal appearance.  No lesions.  Anus: Normal exam.  No lesions.  Perineum: Normal exam.  No lesions.        Bimanual   Adnexae: No masses.  Non-tender to palpation.  Uterus: Enlarged. 13 wks -162 FHT non-tender.  Mobile.  AV.  Adnexae: No masses.  Non-tender to palpation.  Cul-de-sac: Negative for abnormality.  Adnexae: No masses.  Non-tender to palpation.

## 2022-12-05 NOTE — Progress Notes (Signed)
Patient presents today for New OB physical. She states occasional nausea in the morning but rarely has trouble with sickness. Due for her pap smear. Patient completed genetic testing, Materniti21 negative. She states no other questions or concerns at this time.

## 2022-12-10 LAB — CYTOLOGY - PAP: Diagnosis: NEGATIVE

## 2023-01-02 ENCOUNTER — Ambulatory Visit (INDEPENDENT_AMBULATORY_CARE_PROVIDER_SITE_OTHER): Payer: Medicaid Other | Admitting: Obstetrics and Gynecology

## 2023-01-02 ENCOUNTER — Encounter: Payer: Self-pay | Admitting: Obstetrics and Gynecology

## 2023-01-02 VITALS — BP 115/75 | HR 78 | Wt 153.0 lb

## 2023-01-02 DIAGNOSIS — Z1379 Encounter for other screening for genetic and chromosomal anomalies: Secondary | ICD-10-CM

## 2023-01-02 DIAGNOSIS — Z3689 Encounter for other specified antenatal screening: Secondary | ICD-10-CM

## 2023-01-02 DIAGNOSIS — Z3A17 17 weeks gestation of pregnancy: Secondary | ICD-10-CM

## 2023-01-02 DIAGNOSIS — Z3482 Encounter for supervision of other normal pregnancy, second trimester: Secondary | ICD-10-CM

## 2023-01-02 LAB — POCT URINALYSIS DIPSTICK OB
Bilirubin, UA: NEGATIVE
Blood, UA: NEGATIVE
Glucose, UA: NEGATIVE
Ketones, UA: NEGATIVE
Leukocytes, UA: NEGATIVE
Nitrite, UA: NEGATIVE
POC,PROTEIN,UA: NEGATIVE
Spec Grav, UA: 1.015 (ref 1.010–1.025)
Urobilinogen, UA: 0.2 E.U./dL
pH, UA: 7 (ref 5.0–8.0)

## 2023-01-02 NOTE — Progress Notes (Signed)
ROB. Patient states starting to feel possible flutters and having occasional cramping. AFP today. Anatomy ultrasound ordered. She states no questions or concerns at this time.

## 2023-01-02 NOTE — Progress Notes (Signed)
ROB: She has no complaints.  Has reaffirmed her desire for cesarean delivery.  aFP today.  Anatomy ultrasound scheduled with next visit.

## 2023-01-04 LAB — AFP, SERUM, OPEN SPINA BIFIDA
AFP MoM: 0.71
AFP Value: 26.9 ng/mL
Gest. Age on Collection Date: 17 weeks
Maternal Age At EDD: 27.4 yr
OSBR Risk 1 IN: 10000
Test Results:: NEGATIVE
Weight: 153 [lb_av]

## 2023-01-13 ENCOUNTER — Ambulatory Visit
Admission: RE | Admit: 2023-01-13 | Discharge: 2023-01-13 | Disposition: A | Discharge status: Home / Self Care | Payer: Medicaid Other | Source: Ambulatory Visit | Attending: Obstetrics and Gynecology | Admitting: Obstetrics and Gynecology

## 2023-01-13 DIAGNOSIS — Z3689 Encounter for other specified antenatal screening: Secondary | ICD-10-CM | POA: Diagnosis present

## 2023-01-13 DIAGNOSIS — Z3482 Encounter for supervision of other normal pregnancy, second trimester: Secondary | ICD-10-CM | POA: Insufficient documentation

## 2023-01-30 ENCOUNTER — Ambulatory Visit (INDEPENDENT_AMBULATORY_CARE_PROVIDER_SITE_OTHER): Payer: Medicaid Other | Admitting: Obstetrics

## 2023-01-30 ENCOUNTER — Encounter: Payer: Self-pay | Admitting: Obstetrics

## 2023-01-30 VITALS — BP 126/81 | HR 90 | Wt 156.0 lb

## 2023-01-30 DIAGNOSIS — Z3482 Encounter for supervision of other normal pregnancy, second trimester: Secondary | ICD-10-CM

## 2023-01-30 DIAGNOSIS — Z3480 Encounter for supervision of other normal pregnancy, unspecified trimester: Secondary | ICD-10-CM

## 2023-01-30 DIAGNOSIS — Z3A21 21 weeks gestation of pregnancy: Secondary | ICD-10-CM

## 2023-01-30 DIAGNOSIS — Z362 Encounter for other antenatal screening follow-up: Secondary | ICD-10-CM

## 2023-01-30 LAB — POCT URINALYSIS DIPSTICK OB
Bilirubin, UA: NEGATIVE
Blood, UA: NEGATIVE
Glucose, UA: NEGATIVE
Ketones, UA: NEGATIVE
Leukocytes, UA: NEGATIVE
Nitrite, UA: NEGATIVE
POC,PROTEIN,UA: NEGATIVE
Spec Grav, UA: 1.015 (ref 1.010–1.025)
Urobilinogen, UA: 0.2 E.U./dL
pH, UA: 6 (ref 5.0–8.0)

## 2023-01-30 NOTE — Progress Notes (Signed)
    Return Prenatal Note   Assessment/Plan   Plan  27 y.o. G2P1001 at [redacted]w[redacted]d presents for follow-up OB visit. Reviewed prenatal record including previous visit note.  Supervision of other normal pregnancy, antepartum -Reviewed anatomy scan. F/u ordered for incomplete views of spine and cord insertion -Discussed comfort measures for heart burn and encouraged to reduce food triggers. Prilosec if no relief. -Desires RCS. Needs MD visit at 28-32 weeks -Desires BTL. Consent signed.    Orders Placed This Encounter  Procedures   US OB Follow Up    Standing Status:   Future    Standing Expiration Date:   01/29/2024    Order Specific Question:   Reason for Exam (SYMPTOM  OR DIAGNOSIS REQUIRED)    Answer:   incomplete anatomy    Order Specific Question:   Preferred Imaging Location?    Answer:   Internal   POC Urinalysis Dipstick OB   Return in about 4 weeks (around 02/27/2023).   Future Appointments  Date Time Provider Department Center  02/28/2023  2:35 PM Dominic, Courtney Heys, CNM [facility] None    For next visit:  continue with routine prenatal care     Subjective   27 y.o. G2P1001 at [redacted]w[redacted]d presents for this follow-up prenatal visit.  Jenell is feeling great and staying active with her 30-year-old. She is feeling a lot of fetal movement. She is experiencing frequent heartburn at night which is partially relieved by Tums. She reports drinking a lot of Dr Reino Kent which may be a trigger. She has adapted many healthy behaviors this pregnancy. She strongly desires a BTL with her RCS. Patient reports: Movement: Present Contractions: Not present  Objective   Flow sheet Vitals: Pulse Rate: 90 BP: 126/81 Fundal Height: 21 cm Fetal Heart Rate (bpm): 148 Total weight gain: 31 lb (14.1 kg)  General Appearance  No acute distress, well appearing, and well nourished Pulmonary   Normal work of breathing Neurologic   Alert and oriented to person, place, and time Psychiatric   Mood and  affect within normal limits  Glenetta Borg, CNM  01/30/23 1:42 PM

## 2023-01-30 NOTE — Assessment & Plan Note (Addendum)
-  Reviewed anatomy scan. F/u ordered for incomplete views of spine and cord insertion -Discussed comfort measures for heart burn and encouraged to reduce food triggers. Prilosec if no relief. -Desires RCS. Needs MD visit at 28-32 weeks -Desires BTL. Consent signed.

## 2023-02-11 ENCOUNTER — Ambulatory Visit
Admission: RE | Admit: 2023-02-11 | Discharge: 2023-02-11 | Disposition: A | Discharge status: Home / Self Care | Payer: Medicaid Other | Source: Ambulatory Visit | Attending: Obstetrics | Admitting: Obstetrics

## 2023-02-11 DIAGNOSIS — Z362 Encounter for other antenatal screening follow-up: Secondary | ICD-10-CM | POA: Diagnosis not present

## 2023-02-14 ENCOUNTER — Encounter: Payer: Self-pay | Admitting: Obstetrics

## 2023-02-26 ENCOUNTER — Telehealth: Payer: Self-pay | Admitting: Licensed Practical Nurse

## 2023-02-26 NOTE — Telephone Encounter (Signed)
I contacted the patient via phone and about scheduling Change for Friday, 8/9 with Lydia at 2:35 pm. I left message advising the patient of scheduling change to contacted the office.

## 2023-02-28 ENCOUNTER — Encounter: Payer: Medicaid Other | Admitting: Licensed Practical Nurse

## 2023-03-03 DIAGNOSIS — Z2839 Other underimmunization status: Secondary | ICD-10-CM | POA: Insufficient documentation

## 2023-03-06 ENCOUNTER — Ambulatory Visit (INDEPENDENT_AMBULATORY_CARE_PROVIDER_SITE_OTHER): Payer: Medicaid Other

## 2023-03-06 VITALS — BP 117/83 | HR 97 | Wt 166.3 lb

## 2023-03-06 DIAGNOSIS — Z2839 Other underimmunization status: Secondary | ICD-10-CM

## 2023-03-06 DIAGNOSIS — O34219 Maternal care for unspecified type scar from previous cesarean delivery: Secondary | ICD-10-CM

## 2023-03-06 DIAGNOSIS — Z3480 Encounter for supervision of other normal pregnancy, unspecified trimester: Secondary | ICD-10-CM

## 2023-03-06 DIAGNOSIS — Z3A26 26 weeks gestation of pregnancy: Secondary | ICD-10-CM

## 2023-03-06 DIAGNOSIS — K2100 Gastro-esophageal reflux disease with esophagitis, without bleeding: Secondary | ICD-10-CM

## 2023-03-06 DIAGNOSIS — Z98891 History of uterine scar from previous surgery: Secondary | ICD-10-CM

## 2023-03-06 DIAGNOSIS — F321 Major depressive disorder, single episode, moderate: Secondary | ICD-10-CM

## 2023-03-06 DIAGNOSIS — R7309 Other abnormal glucose: Secondary | ICD-10-CM

## 2023-03-06 DIAGNOSIS — D649 Anemia, unspecified: Secondary | ICD-10-CM

## 2023-03-06 MED ORDER — FAMOTIDINE 20 MG PO TABS
20.0000 mg | ORAL_TABLET | Freq: Every evening | ORAL | 3 refills | Status: DC
Start: 2023-03-06 — End: 2023-06-17

## 2023-03-06 NOTE — Progress Notes (Addendum)
    Return Prenatal Note   Assessment/Plan   Plan  27 y.o. G2P1001 at [redacted]w[redacted]d presents for follow-up OB visit. Reviewed prenatal record including previous visit note.  Status post cesarean delivery Will plan for MD visit at next appointment for repeat CS planning.  Supervision of other normal pregnancy, antepartum Prepared for 1 hour glucola and third trimester labs at next visit. Declines Tdap after counseling. Provided prescription for famotidine for increasing acid reflux in pregnancy. Reviewed kick counts and preterm labor warning signs. Instructed to call office or come to hospital with persistent headache, vision changes, regular contractions, leaking of fluid, decreased fetal movement or vaginal bleeding.    Orders Placed This Encounter  Procedures   28 Week RH+Panel    Standing Status:   Future    Standing Expiration Date:   03/05/2024   Return in about 2 weeks (around 03/20/2023) for ROB with MD for [facility] consent and 1 hour glucola.   Future Appointments  Date Time Provider Department Center  03/20/2023 10:00 AM [facility] None  03/20/2023 10:35 AM Linzie Collin, MD [facility] None  03/27/2023  9:55 AM Allie Bossier, MD [facility] None    For next visit:  ROB with 1 hour gluocla, third trimester labs, and Tdap     Subjective   27 y.o. G2P1001 at [redacted]w[redacted]d presents for this follow-up prenatal visit.  Patient has been having increased acid reflux. Patient reports: Movement: Present Contractions: Not present  Objective   Flow sheet Vitals: Pulse Rate: 97 BP: 117/83 Fundal Height: 26 cm Fetal Heart Rate (bpm): 155 Total weight gain: 41 lb 4.8 oz (18.7 kg)  General Appearance  No acute distress, well appearing, and well nourished Pulmonary   Normal work of breathing Neurologic   Alert and oriented to person, place, and time Psychiatric   Mood and affect within normal limits  Lindalou Hose , CNM  08/15/242:25 PM

## 2023-03-06 NOTE — Assessment & Plan Note (Addendum)
Will plan for MD visit at next appointment for repeat CS planning.

## 2023-03-06 NOTE — Assessment & Plan Note (Addendum)
Prepared for 1 hour glucola and third trimester labs at next visit. Declines Tdap after counseling. Provided prescription for famotidine for increasing acid reflux in pregnancy. Reviewed kick counts and preterm labor warning signs. Instructed to call office or come to hospital with persistent headache, vision changes, regular contractions, leaking of fluid, decreased fetal movement or vaginal bleeding.

## 2023-03-13 ENCOUNTER — Emergency Department
Admission: EM | Admit: 2023-03-13 | Discharge: 2023-03-14 | Disposition: A | Discharge status: Home / Self Care | Payer: Medicaid Other | Attending: Emergency Medicine | Admitting: Emergency Medicine

## 2023-03-13 ENCOUNTER — Encounter: Payer: Self-pay | Admitting: Emergency Medicine

## 2023-03-13 ENCOUNTER — Other Ambulatory Visit: Payer: Self-pay

## 2023-03-13 DIAGNOSIS — T65891A Toxic effect of other specified substances, accidental (unintentional), initial encounter: Secondary | ICD-10-CM | POA: Insufficient documentation

## 2023-03-13 DIAGNOSIS — O99512 Diseases of the respiratory system complicating pregnancy, second trimester: Secondary | ICD-10-CM | POA: Insufficient documentation

## 2023-03-13 DIAGNOSIS — J984 Other disorders of lung: Secondary | ICD-10-CM

## 2023-03-13 DIAGNOSIS — R0602 Shortness of breath: Secondary | ICD-10-CM

## 2023-03-13 DIAGNOSIS — J698 Pneumonitis due to inhalation of other solids and liquids: Secondary | ICD-10-CM | POA: Insufficient documentation

## 2023-03-13 NOTE — ED Triage Notes (Signed)
Pt presents ambulatory to triage via POV with complaints of SOB x 4 hours after working around dust and mold for several hours today. Pt is [redacted] week pregnant and was not wearing a masking while cleaning in this environment. Respirations are equal and unlabored. A&Ox4 at this time. Denies CP, fevers, chills, N/V.

## 2023-03-13 NOTE — ED Notes (Signed)
Discussed the pt with Dr. Derrill Kay - no additional orders at this time.

## 2023-03-14 NOTE — ED Provider Notes (Signed)
[facility] Provider Note    Event Date/Time   First MD Initiated Contact with Patient 03/14/23 0004     (approximate)   History   Shortness of Breath   HPI  Tiffany Guzman is a 27 y.o. female approximately [redacted] weeks pregnant who presents to the ED from home with a chief complaint of shortness of breath.  Patient was cleaning out her father's basement today and states she breathed in dust and mold for several hours.  Subsequently began to experience shortness of breath with chest tightness.  Denies fever/chills, abdominal pain, nausea/vomiting, vaginal bleeding or dysuria.  Symptoms have resolved by the time of our interview and examination.  She is feeling the baby move.     Past Medical History   Past Medical History:  Diagnosis Date   Acne    Anxiety    Attention deficit hyperactivity disorder (ADHD)    Chronic upper back pain    lower and upper back pain   Depression    Dyspnea    GERD (gastroesophageal reflux disease)    Gestational hypertension 11/14/2017   Migraine with aura    since elementary school   Postoperative nausea and vomiting 02/27/2021     Active Problem List   Patient Active Problem List   Diagnosis Date Noted   Rubella non-immune status, antepartum 03/03/2023   Supervision of other normal pregnancy, antepartum 10/30/2022   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) 11/28/2021   Current moderate episode of major depressive disorder without prior episode (HCC) 11/28/2021   IBS (irritable bowel syndrome) 03/15/2021   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   Bradycardia 02/26/2021   Right ovarian cyst 02/25/2021   Leukocytosis 02/25/2021   Status post ovarian cystectomy 02/25/2021   S/P cholecystectomy    Status post cesarean delivery 11/14/2017   Dermoid cyst 10/03/2017   Dermoid cyst of left ovary 10/01/2017   Anxiety disorder 06/24/2016     Past Surgical History   Past Surgical History:   Procedure Laterality Date   CESAREAN SECTION N/A 11/14/2017   Procedure: CESAREAN SECTION;  Surgeon: [name], MD;  Location: [facility] ORS;  Service: Obstetrics;  Laterality: N/A;   CHOLECYSTECTOMY     ESOPHAGOGASTRODUODENOSCOPY Left 02/17/2019   Procedure: ESOPHAGOGASTRODUODENOSCOPY (EGD);  Surgeon: Pasty Spillers, MD;  Location: [facility] ENDOSCOPY;  Service: Endoscopy;  Laterality: Left;   INSERTION OF NON VAGINAL CONTRACEPTIVE DEVICE     LAPAROSCOPIC OVARIAN CYSTECTOMY Right 02/23/2019   Procedure: LAPAROSCOPIC OVARIAN CYSTECTOMY;  Surgeon: Natale Milch, MD;  Location: [facility] ORS;  Service: Gynecology;  Laterality: Right;   LAPAROSCOPY N/A 02/25/2021   Procedure: LAPAROSCOPY DIAGNOSTIC;  Surgeon: [name], MD;  Location: [facility] ORS;  Service: Gynecology;  Laterality: N/A;   OOPHORECTOMY Left 10/03/2017   Procedure: OOPHORECTOMY;  Surgeon: Natale Milch, MD;  Location: [facility] ORS;  Service: Gynecology;  Laterality: Left;   OVARIAN CYST REMOVAL Left 10/03/2017   Procedure: OVARIAN CYSTECTOMY;  Surgeon: Natale Milch, MD;  Location: [facility] ORS;  Service: Gynecology;  Laterality: Left;   OVARIAN CYST REMOVAL Right 02/25/2021   Procedure: OVARIAN CYSTECTOMY;  Surgeon: [name], MD;  Location: [facility] ORS;  Service: Gynecology;  Laterality: Right;   WISDOM TOOTH EXTRACTION  09/05/2022   four     Home Medications   Prior to Admission medications   Medication Sig Start Date End Date Taking? Authorizing Provider  doxylamine, Sleep, (UNISOM) 25 MG tablet Take 10 mg by mouth at bedtime as  needed. Patient not taking: Reported on 12/05/2022    [provider]  famotidine (PEPCID) 20 MG tablet Take 1 tablet (20 mg total) by mouth at bedtime. 03/06/23   Free, Lindalou Hose, CNM  Prenatal Vit-Fe Fumarate-FA (PRENATAL PO) Take by mouth.    [provider]     Allergies  Buspirone   Family History   Family History  Problem Relation Age of  Onset   Hypertension Mother    Hypothyroidism Mother    Depression Mother    Healthy Father    Hypertension Sister    Healthy Sister    Thyroid disease Maternal Grandmother    Diabetes Maternal Grandmother    Thyroid disease Maternal Grandfather    Hypertension Paternal Grandmother    Healthy Paternal Grandfather      Physical Exam  Triage Vital Signs: ED Triage Vitals  Encounter Vitals Group     BP 03/13/23 2214 128/82     Systolic BP Percentile --      Diastolic BP Percentile --      Pulse Rate 03/13/23 2214 (!) 101     Resp 03/13/23 2214 19     Temp 03/13/23 2214 98.1 F (36.7 C)     Temp Source 03/13/23 2214 Oral     SpO2 03/13/23 2214 99 %     Weight 03/13/23 2211 166 lb (75.3 kg)     Height 03/13/23 2211 5\' 4"  (1.626 m)     Head Circumference --      Peak Flow --      Pain Score 03/13/23 2211 2     Pain Loc --      Pain Education --      Exclude from Growth Chart --     Updated Vital Signs: BP 121/78 (BP Location: Left Arm)   Pulse 99   Temp 98.1 F (36.7 C) (Oral)   Resp (!) 21   Ht 5\' 4"  (1.626 m)   Wt 75.3 kg   LMP 08/31/2022 (Exact Date)   SpO2 100%   BMI 28.49 kg/m    General: Awake, no distress.  CV:  RRR.  Good peripheral perfusion.  Resp:  Normal effort.  CTAB. Abd:  Nontender.  No distention.  Other:  Bilateral calves are supple and nontender.   ED Results / Procedures / Treatments  Labs (all labs ordered are listed, but only abnormal results are displayed) Labs Reviewed - No data to display   EKG  ED ECG REPORT I, Numair Masden J, the attending physician, personally viewed and interpreted this ECG.   Date: 03/14/2023  EKG Time: 2214  Rate: 91  Rhythm: normal sinus rhythm  Axis: Normal  Intervals:none  ST&T Change: Nonspecific    RADIOLOGY None   Official radiology report(s): No results found.   PROCEDURES:  Critical Care performed: No  Procedures   MEDICATIONS ORDERED IN ED: Medications - No data to  display   IMPRESSION / MDM / ASSESSMENT AND PLAN / ED COURSE  I reviewed the triage vital signs and the nursing notes.                             27 year old female approximately [redacted] weeks pregnant presenting with shortness of breath after cleaning out moldy basement and breathing in dust. Differential includes, but is not limited to, viral syndrome, bronchitis including COPD exacerbation, pneumonia, reactive airway disease including asthma, CHF including exacerbation with or without pulmonary/interstitial edema, pneumothorax, ACS, thoracic  trauma, and pulmonary embolism.  I personally reviewed patient's records and note a routine prenatal visit on 03/06/2023.  Patient's presentation is most consistent with acute, uncomplicated illness.  Discussed risk/benefits and offered chest x-ray with abdominal shielding; we agreed to hold off as patient's room air saturations are 100%, lungs are clear and she is overall feeling better.  Fetal heart tones unremarkable.  She will refrain from cleaning out a dusty, moldy basement in the future.  Strict return precautions given.  Patient verbalizes understanding and agrees with plan of care.  Clinical Course as of 03/14/23 0030  Fri Mar 14, 2023  0030 Fetal heart tones were most likely the patient's heart rate.  Bedside ultrasound performed. +IUP, + fetal movement, FHR 140s. [JS]    Clinical Course User Index [JS] Irean Hong, MD     FINAL CLINICAL IMPRESSION(S) / ED DIAGNOSES   Final diagnoses:  Shortness of breath  Pneumonitis     Rx / DC Orders   ED Discharge Orders     None        Note:  This document was prepared using Dragon voice recognition software and may include unintentional dictation errors.   Irean Hong, MD 03/14/23 [phone]

## 2023-03-14 NOTE — Discharge Instructions (Signed)
Return to the ER for worsening symptoms, persistent vomiting, difficulty breathing, chest pain or other concerns.

## 2023-03-20 ENCOUNTER — Encounter: Payer: Self-pay | Admitting: Obstetrics and Gynecology

## 2023-03-20 ENCOUNTER — Other Ambulatory Visit: Payer: Medicaid Other

## 2023-03-20 ENCOUNTER — Ambulatory Visit (INDEPENDENT_AMBULATORY_CARE_PROVIDER_SITE_OTHER): Payer: Medicaid Other | Admitting: Obstetrics and Gynecology

## 2023-03-20 VITALS — BP 131/83 | HR 90 | Wt 170.9 lb

## 2023-03-20 DIAGNOSIS — Z3A28 28 weeks gestation of pregnancy: Secondary | ICD-10-CM

## 2023-03-20 DIAGNOSIS — Z3480 Encounter for supervision of other normal pregnancy, unspecified trimester: Secondary | ICD-10-CM

## 2023-03-20 DIAGNOSIS — Z3483 Encounter for supervision of other normal pregnancy, third trimester: Secondary | ICD-10-CM

## 2023-03-20 LAB — POCT URINALYSIS DIPSTICK OB
Bilirubin, UA: NEGATIVE
Blood, UA: NEGATIVE
Glucose, UA: NEGATIVE
Ketones, UA: NEGATIVE
Leukocytes, UA: NEGATIVE
Nitrite, UA: NEGATIVE
POC,PROTEIN,UA: NEGATIVE
Spec Grav, UA: 1.01 (ref 1.010–1.025)
Urobilinogen, UA: 0.2 E.U./dL
pH, UA: 7 (ref 5.0–8.0)

## 2023-03-20 NOTE — Progress Notes (Signed)
ROB. She states daily fetal movement along with hip pain. Unable to complete her GCT today due to drinking a pepsi this morning, will come back tomorrow. TDAP declined. Patient would like to discuss RCS and BTL today.

## 2023-03-20 NOTE — Progress Notes (Signed)
ROB: She reports that she is doing well.  Has occasional hip pain and we have discussed this.  She reports daily fetal movement.  She plans to do her 1 hour GCT tomorrow morning.  She has reaffirmed her desire for repeat cesarean delivery today.  She would like to schedule this if possible.  She has also reaffirmed her desire for permanent sterilization.  (See discussion below for permanent sterilization )she states that she has previously signed appropriate paperwork for this.  Cesarean delivery now scheduled for November 15.  All questions were regarding cesarean delivery and TOLAC discussed.  Tubal We have discussed permanent sterilization in detail.  I have reviewed Filshie clip placement as a means of sterilization.  I have explained the risks and benefits of this procedure in detail.  I have stressed the fact that this is a permanent and not reversible procedure and there is a failure rate of 3 to7 per 1000 which is somewhat timing and method dependent.  I have discussed the possibility of inadvertent damage to bowel, bladder, blood vessels or other internal organs..  I have specifically discussed the risk of anesthesia, infection and blood loss with her.  We have discussed the possibility of laparotomy should there be a problem and the additional possibility of a longer hospital stay.  I have spoken with her regarding the recovery period, informing her that after this procedure, most people are performing their normal daily activities within one week.  I have recommended abstinence or another birth control method for 2-4 weeks following this procedure.  Other options of birth control have also been discussed.  The procedure of sterilization and alternate methods of birth control were discussed in detail with the patient.  I have answered all her questions and I believe that she has an adequate and informed understanding of the procedure.

## 2023-03-21 ENCOUNTER — Other Ambulatory Visit: Payer: Medicaid Other

## 2023-03-22 DIAGNOSIS — D649 Anemia, unspecified: Secondary | ICD-10-CM | POA: Insufficient documentation

## 2023-03-22 LAB — 28 WEEK RH+PANEL
Basophils Absolute: 0 10*3/uL (ref 0.0–0.2)
Basos: 0 %
EOS (ABSOLUTE): 0.1 10*3/uL (ref 0.0–0.4)
Eos: 1 %
Gestational Diabetes Screen: 140 mg/dL — ABNORMAL HIGH (ref 70–139)
HIV Screen 4th Generation wRfx: NONREACTIVE
Hematocrit: 29.6 % — ABNORMAL LOW (ref 34.0–46.6)
Hemoglobin: 9.5 g/dL — ABNORMAL LOW (ref 11.1–15.9)
Immature Grans (Abs): 0.1 10*3/uL (ref 0.0–0.1)
Immature Granulocytes: 1 %
Lymphocytes Absolute: 1.1 10*3/uL (ref 0.7–3.1)
Lymphs: 12 %
MCH: 27.5 pg (ref 26.6–33.0)
MCHC: 32.1 g/dL (ref 31.5–35.7)
MCV: 86 fL (ref 79–97)
Monocytes Absolute: 0.6 10*3/uL (ref 0.1–0.9)
Monocytes: 6 %
Neutrophils Absolute: 7.8 10*3/uL — ABNORMAL HIGH (ref 1.4–7.0)
Neutrophils: 80 %
Platelets: 235 10*3/uL (ref 150–450)
RBC: 3.45 x10E6/uL — ABNORMAL LOW (ref 3.77–5.28)
RDW: 14.6 % (ref 11.7–15.4)
RPR Ser Ql: NONREACTIVE
WBC: 9.7 10*3/uL (ref 3.4–10.8)

## 2023-03-22 MED ORDER — FERROUS SULFATE 325 (65 FE) MG PO TABS
325.0000 mg | ORAL_TABLET | ORAL | 3 refills | Status: DC
Start: 2023-03-22 — End: 2023-08-20

## 2023-03-22 NOTE — Addendum Note (Signed)
Addended by: Autumn Messing on: 03/22/2023 12:31 PM   Modules accepted: Orders

## 2023-03-25 NOTE — Progress Notes (Signed)
The patient has been contacted and scheduled for 9/4 at 8:40 am for labs

## 2023-03-26 ENCOUNTER — Other Ambulatory Visit: Payer: Medicaid Other

## 2023-03-26 DIAGNOSIS — R7309 Other abnormal glucose: Secondary | ICD-10-CM

## 2023-03-27 ENCOUNTER — Ambulatory Visit (INDEPENDENT_AMBULATORY_CARE_PROVIDER_SITE_OTHER): Payer: Medicaid Other | Admitting: Obstetrics & Gynecology

## 2023-03-27 VITALS — BP 127/84 | HR 96 | Wt 173.0 lb

## 2023-03-27 DIAGNOSIS — Z98891 History of uterine scar from previous surgery: Secondary | ICD-10-CM

## 2023-03-27 DIAGNOSIS — O34219 Maternal care for unspecified type scar from previous cesarean delivery: Secondary | ICD-10-CM

## 2023-03-27 DIAGNOSIS — O360130 Maternal care for anti-D [Rh] antibodies, third trimester, not applicable or unspecified: Secondary | ICD-10-CM

## 2023-03-27 DIAGNOSIS — O09899 Supervision of other high risk pregnancies, unspecified trimester: Secondary | ICD-10-CM

## 2023-03-27 DIAGNOSIS — Z3A29 29 weeks gestation of pregnancy: Secondary | ICD-10-CM

## 2023-03-27 DIAGNOSIS — O2603 Excessive weight gain in pregnancy, third trimester: Secondary | ICD-10-CM | POA: Insufficient documentation

## 2023-03-27 DIAGNOSIS — Z3009 Encounter for other general counseling and advice on contraception: Secondary | ICD-10-CM | POA: Insufficient documentation

## 2023-03-27 DIAGNOSIS — Z3480 Encounter for supervision of other normal pregnancy, unspecified trimester: Secondary | ICD-10-CM

## 2023-03-27 LAB — GESTATIONAL GLUCOSE TOLERANCE
Glucose, Fasting: 78 mg/dL (ref 70–94)
Glucose, GTT - 1 Hour: 149 mg/dL (ref 70–179)
Glucose, GTT - 2 Hour: 116 mg/dL (ref 70–154)
Glucose, GTT - 3 Hour: 120 mg/dL (ref 70–139)

## 2023-03-27 NOTE — Progress Notes (Signed)
   PRENATAL VISIT NOTE  Subjective:  Tiffany Guzman is a 27 y.o. G26P1001 (5 yo son) at [redacted]w[redacted]d being seen today for ongoing prenatal care.  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  Patient reports no complaints.   . Vag. Bleeding: None.  Movement: Present. Denies leaking of fluid.   The following portions of the patient's history were reviewed and updated as appropriate: allergies, current medications, past family history, past medical history, past social history, past surgical history and problem list.   Objective:   Vitals:   03/27/23 0928  BP: 127/84  Pulse: 96  Weight: 173 lb (78.5 kg)    Fetal Status:     Movement: Present     General:  Alert, oriented and cooperative. Patient is in no acute distress.  Skin: Skin is warm and dry. No rash noted.   Cardiovascular: Normal heart rate noted  Respiratory: Normal respiratory effort, no problems with respiration noted  Abdomen: Soft, gravid, appropriate for gestational age.  Pain/Pressure: Present     Pelvic: Cervical exam deferred        Extremities: Normal range of motion.     Mental Status: Normal mood and affect. Normal behavior. Normal judgment and thought content.   FHR- 140s  Assessment and Plan:  Pregnancy: G2P1001 at [redacted]w[redacted]d 1. Supervision of other normal pregnancy, antepartum  2. Rubella  non-immune status, antepartum - immunize postpartum  3. Status post cesarean delivery - She prefers a RLTCS  4. Unwanted fertility - She plans for a BLT with her RLTCS  5. Excessive weight gain during pregnancy in third trimester - We discussed the risk of stillbirth and she has recently stopped drinking her usual 4-5 cans of Pepsi.   Preterm labor symptoms and general obstetric precautions including but not limited to vaginal bleeding, contractions, leaking of fluid and fetal movement were reviewed in detail with the patient. Please refer to After Visit Summary for other counseling recommendations.   No follow-ups on file.  Future Appointments  Date Time Provider Department Center  03/27/2023  9:55 AM Allie Bossier, MD [facility] None  05/29/2023  8:00 AM [facility] None    Allie Bossier, MD

## 2023-04-09 ENCOUNTER — Ambulatory Visit (INDEPENDENT_AMBULATORY_CARE_PROVIDER_SITE_OTHER): Payer: Medicaid Other | Admitting: Obstetrics and Gynecology

## 2023-04-09 ENCOUNTER — Encounter: Payer: Self-pay | Admitting: Obstetrics and Gynecology

## 2023-04-09 VITALS — BP 126/89 | HR 88 | Wt 176.6 lb

## 2023-04-09 DIAGNOSIS — Z3482 Encounter for supervision of other normal pregnancy, second trimester: Secondary | ICD-10-CM

## 2023-04-09 DIAGNOSIS — O368130 Decreased fetal movements, third trimester, not applicable or unspecified: Secondary | ICD-10-CM

## 2023-04-09 DIAGNOSIS — O34219 Maternal care for unspecified type scar from previous cesarean delivery: Secondary | ICD-10-CM

## 2023-04-09 DIAGNOSIS — G5600 Carpal tunnel syndrome, unspecified upper limb: Secondary | ICD-10-CM

## 2023-04-09 DIAGNOSIS — Z3480 Encounter for supervision of other normal pregnancy, unspecified trimester: Secondary | ICD-10-CM

## 2023-04-09 DIAGNOSIS — O99353 Diseases of the nervous system complicating pregnancy, third trimester: Secondary | ICD-10-CM

## 2023-04-09 DIAGNOSIS — Z2821 Immunization not carried out because of patient refusal: Secondary | ICD-10-CM | POA: Insufficient documentation

## 2023-04-09 DIAGNOSIS — Z3A30 30 weeks gestation of pregnancy: Secondary | ICD-10-CM

## 2023-04-09 DIAGNOSIS — R7309 Other abnormal glucose: Secondary | ICD-10-CM | POA: Insufficient documentation

## 2023-04-09 LAB — POCT URINALYSIS DIPSTICK OB
Bilirubin, UA: NEGATIVE
Blood, UA: NEGATIVE
Glucose, UA: NEGATIVE
Ketones, UA: NEGATIVE
Leukocytes, UA: NEGATIVE
Nitrite, UA: NEGATIVE
Spec Grav, UA: 1.01 (ref 1.010–1.025)
Urobilinogen, UA: 0.2 U/dL
pH, UA: 7 (ref 5.0–8.0)

## 2023-04-09 NOTE — Progress Notes (Signed)
ROB [redacted]w[redacted]d: She is doing well. She reports some decreased movement. She has no new concerns today.

## 2023-04-09 NOTE — Progress Notes (Signed)
ROB: Patient is a 27 y.o. G2P1001 at [redacted]w[redacted]d who presents for routine OB care.  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    Patient has complaints of hand numbness and tingling, also foot and hand swelling. Notes she did not have any swelling in her last pregnancy. Advised that this was common, discussed comfort remedies.  Also notes that her she feels her baby has been less active recently, however on further discussion, notes that she has also been more busy lately and so has not paid as much attention to his movements. Is feeling movement today. Advised on fetal kick counts, discussed tips and tricks to encourage movement. Completed 3 hr GTT, was normal. Discussed RSV and flu vaccine in pregnancy.  Patient notes she wil likely decline. Declined Tdap last visit. Still given handouts on vaccine information for review. RTC in 2 weeks.

## 2023-04-23 ENCOUNTER — Ambulatory Visit (INDEPENDENT_AMBULATORY_CARE_PROVIDER_SITE_OTHER): Payer: Medicaid Other | Admitting: Obstetrics

## 2023-04-23 VITALS — BP 115/75 | HR 92 | Wt 174.0 lb

## 2023-04-23 DIAGNOSIS — Z3A32 32 weeks gestation of pregnancy: Secondary | ICD-10-CM

## 2023-04-23 DIAGNOSIS — Z3480 Encounter for supervision of other normal pregnancy, unspecified trimester: Secondary | ICD-10-CM

## 2023-04-23 DIAGNOSIS — F1291 Cannabis use, unspecified, in remission: Secondary | ICD-10-CM

## 2023-04-23 LAB — POCT URINALYSIS DIPSTICK OB
Bilirubin, UA: NEGATIVE
Blood, UA: NEGATIVE
Glucose, UA: NEGATIVE
Ketones, UA: NEGATIVE
Nitrite, UA: NEGATIVE
POC,PROTEIN,UA: NEGATIVE
Spec Grav, UA: <=1.005 — AB (ref 1.010–1.025)
Urobilinogen, UA: 0.2 U/dL
pH, UA: 7 (ref 5.0–8.0)

## 2023-04-23 NOTE — Progress Notes (Signed)
Routine Prenatal Care Visit  Subjective  Tiffany Guzman is a 27 y.o. G2P1001 at [redacted]w[redacted]d being seen today for ongoing prenatal care.  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  ----------------------------------------------------------------------------------- Patient reports no complaints.  She has named her son Engineer, materials. Still plans on repeat CS and BTL. No longer using MJ- quit awhile due to Hyperemesis R/T MJ use. Contractions: Irritability. Vag. Bleeding: None.  Movement: Present. Leaking Fluid denies.  ----------------------------------------------------------------------------------- The following portions of the patient's history were reviewed and updated as appropriate: allergies, current medications, past family history, past medical history, past social history, past surgical history and problem list. Problem list updated.  Objective  Blood pressure 115/75, pulse 92, weight 174 lb (78.9 kg), last menstrual period 08/31/2022. Pregravid weight 125 lb (56.7 kg) Total Weight Gain 49 lb (22.2 kg) Urinalysis: Urine Protein    Urine Glucose    Fetal Status: Fetal Heart Rate (bpm): 148 Fundal Height: 33 cm  Movement: Present     General:  Alert, oriented and cooperative. Patient is in no acute distress.  Skin: Skin is warm and dry. No rash noted.   Cardiovascular: Normal heart rate noted  Respiratory: Normal respiratory effort, no problems with respiration noted  Abdomen: Soft, gravid, appropriate for gestational age. Pain/Pressure: Present     Pelvic:  Cervical exam deferred        Extremities: Normal range of motion.  Edema: None  Mental Status: Normal mood and affect. Normal behavior. Normal judgment and thought content.   Assessment   27 y.o. G2P1001 at [redacted]w[redacted]d by  06/12/2023, by Ultrasound presenting for routine prenatal visit  Plan   second Problems (from 10/30/22 to present)     Problem Noted Resolved   Anemia 03/22/2023 by Free, Lindalou Hose, CNM No   Overview Signed 03/22/2023 12:30 PM by Burney Gauze, CNM    28 wks H/H: 9.5/12.1      Rubella non-immune status, antepartum 03/03/2023 by Free, Lindalou Hose, CNM No   Supervision of other normal pregnancy, antepartum 10/30/2022 by Loran Senters, CMA No   Overview Addendum 04/09/2023  9:07 AM by Hildred Laser, MD     Clinical Staff Provider  Office Location  [facility] Dating  10/23/2022, [redacted]w[redacted]d   Language  English Anatomy US    Flu Vaccine   declined Genetic Screen  NIPS: Negative/Female  TDaP vaccine   declined Hgb A1C or  GTT Early : Third trimester : 140, 3 hr GTT nml  Covid declined   LAB RESULTS   Rhogam  B/Positive/-- (04/29 1004)  Blood Type B/Positive/-- (04/29 1004)   Feeding Plan breast Antibody Negative (04/29 1004)  Contraception BTL Rubella 0.94 (04/29 1004)  Circumcision yes RPR Non Reactive (04/29 1004)   Pediatrician  [facility] HBsAg Negative (04/29 1004)   Support Person Josh HIV Non Reactive (04/29 1004)  Prenatal Classes no Varicella  Immune (11/18/1002)    GBS  (For  PCN allergy, check sensitivities)   BTL Consent Completed Hep C Non Reactive (04/29 1004)   VBAC Consent  Pap Diagnosis  Date Value Ref Range Status   12/05/2022   Final   - Negative for intraepithelial lesion or malignancy (NILM)      Hgb Electro      CF      SMA                    Preterm labor symptoms and general obstetric precautions including but not limited to vaginal bleeding, contractions, leaking of fluid and fetal movement were reviewed in detail with the patient. Please refer to After Visit Summary for other counseling recommendations.   Return in about 2 weeks (around 05/07/2023) for MD visit please to finalize CS plans, return OB.  Mirna Mires, CNM  04/23/2023 8:39 AM

## 2023-04-24 LAB — URINE DRUG PANEL 7
Amphetamines, Urine: NEGATIVE ng/mL
Barbiturate Quant, Ur: NEGATIVE ng/mL
Benzodiazepine Quant, Ur: NEGATIVE ng/mL
Cannabinoid Quant, Ur: NEGATIVE ng/mL
Cocaine (Metab.): NEGATIVE ng/mL
Opiate Quant, Ur: NEGATIVE ng/mL
PCP Quant, Ur: NEGATIVE ng/mL

## 2023-04-25 ENCOUNTER — Observation Stay
Admission: EM | Admit: 2023-04-25 | Discharge: 2023-04-26 | Disposition: A | Discharge status: Home / Self Care | Payer: Medicaid Other | Attending: Obstetrics and Gynecology | Admitting: Obstetrics and Gynecology

## 2023-04-25 ENCOUNTER — Encounter: Payer: Self-pay | Admitting: Advanced Practice Midwife

## 2023-04-25 DIAGNOSIS — Z79899 Other long term (current) drug therapy: Secondary | ICD-10-CM | POA: Diagnosis not present

## 2023-04-25 DIAGNOSIS — Z3A33 33 weeks gestation of pregnancy: Secondary | ICD-10-CM | POA: Insufficient documentation

## 2023-04-25 DIAGNOSIS — Z87891 Personal history of nicotine dependence: Secondary | ICD-10-CM | POA: Insufficient documentation

## 2023-04-25 DIAGNOSIS — Y92009 Unspecified place in unspecified non-institutional (private) residence as the place of occurrence of the external cause: Secondary | ICD-10-CM | POA: Diagnosis not present

## 2023-04-25 DIAGNOSIS — R102 Pelvic and perineal pain: Secondary | ICD-10-CM | POA: Insufficient documentation

## 2023-04-25 DIAGNOSIS — O26893 Other specified pregnancy related conditions, third trimester: Secondary | ICD-10-CM | POA: Diagnosis present

## 2023-04-25 DIAGNOSIS — W1839XA Other fall on same level, initial encounter: Secondary | ICD-10-CM | POA: Diagnosis not present

## 2023-04-25 DIAGNOSIS — Z98891 History of uterine scar from previous surgery: Secondary | ICD-10-CM | POA: Insufficient documentation

## 2023-04-25 NOTE — OB Triage Note (Signed)
Tiffany Guzman 27 y.o. G2P1 @33w1dGA   presents to Labor & Delivery triage via wheelchair steered by ED staff reporting left sided abdominal and pelvic pain resulting from a fall. . She denies signs and symptoms consistent with rupture of membranes or active vaginal bleeding. She denies contractions and states positive fetal movement. External FM and TOCO applied to non-tender abdomen. Initial FHR 135. Vital signs obtained and within normal limits. Patient oriented to care environment including call bell and bed control use. Tresea Mall, CNM notified of patient's arrival.

## 2023-04-26 DIAGNOSIS — Z3A33 33 weeks gestation of pregnancy: Secondary | ICD-10-CM | POA: Diagnosis not present

## 2023-04-26 DIAGNOSIS — O26893 Other specified pregnancy related conditions, third trimester: Secondary | ICD-10-CM

## 2023-04-26 DIAGNOSIS — R109 Unspecified abdominal pain: Secondary | ICD-10-CM | POA: Diagnosis not present

## 2023-04-26 DIAGNOSIS — W19XXXA Unspecified fall, initial encounter: Secondary | ICD-10-CM | POA: Diagnosis not present

## 2023-04-26 DIAGNOSIS — O9A213 Injury, poisoning and certain other consequences of external causes complicating pregnancy, third trimester: Secondary | ICD-10-CM | POA: Diagnosis not present

## 2023-04-26 NOTE — Discharge Summary (Signed)
Physician Final Progress Note  Patient ID: Tiffany Guzman MRN: [id number] DOB/AGE: 12-24-1995 27 y.o.  Admit date: 04/25/2023 Admitting provider: Tresea Mall, CNM Discharge date: 04/26/2023   Admission Diagnoses:  1) intrauterine pregnancy at [redacted]w[redacted]d  2) abdominal pain from a fall  Discharge Diagnoses:  Active Problems:   Labor and delivery, indication for care   [redacted] weeks gestation of pregnancy   Abdominal pain during pregnancy in third trimester    History of Present Illness: The patient is a 27 y.o. female G2P1001 at [redacted]w[redacted]d who presents for left side abdominal and pelvic pain following a fall at home this evening. She describes the pain as like pulled muscles as she was trying to protect her belly at the time. She reports good fetal movement and denies contractions, leakage of fluid or vaginal bleeding. She was admitted for observation and placed on monitors for extended monitoring. Reactive NST and negative toco. Patient was discharged to home with instructions and precautions.   Past Medical History:  Diagnosis Date   Acne    Anxiety    Attention deficit hyperactivity disorder (ADHD)    Chronic upper back pain    lower and upper back pain   Depression    Dyspnea    GERD (gastroesophageal reflux disease)    Gestational hypertension 11/14/2017   Migraine with aura    since elementary school   Postoperative nausea and vomiting 02/27/2021    Past Surgical History:  Procedure Laterality Date   CESAREAN SECTION N/A 11/14/2017   Procedure: CESAREAN SECTION;  Surgeon: [name], MD;  Location: [facility] ORS;  Service: Obstetrics;  Laterality: N/A;   CHOLECYSTECTOMY     ESOPHAGOGASTRODUODENOSCOPY Left 02/17/2019   Procedure: ESOPHAGOGASTRODUODENOSCOPY (EGD);  Surgeon: Pasty Spillers, MD;  Location: [facility] ENDOSCOPY;  Service: Endoscopy;  Laterality: Left;   INSERTION OF NON VAGINAL CONTRACEPTIVE DEVICE     LAPAROSCOPIC OVARIAN CYSTECTOMY Right 02/23/2019    Procedure: LAPAROSCOPIC OVARIAN CYSTECTOMY;  Surgeon: Natale Milch, MD;  Location: [facility] ORS;  Service: Gynecology;  Laterality: Right;   LAPAROSCOPY N/A 02/25/2021   Procedure: LAPAROSCOPY DIAGNOSTIC;  Surgeon: [name], MD;  Location: [facility] ORS;  Service: Gynecology;  Laterality: N/A;   OOPHORECTOMY Left 10/03/2017   Procedure: OOPHORECTOMY;  Surgeon: Natale Milch, MD;  Location: [facility];  Service: Gynecology;  Laterality: Left;   OVARIAN CYST REMOVAL Left 10/03/2017   Procedure: OVARIAN CYSTECTOMY;  Surgeon: Natale Milch, MD;  Location: [facility];  Service: Gynecology;  Laterality: Left;   OVARIAN CYST REMOVAL Right 02/25/2021   Procedure: OVARIAN CYSTECTOMY;  Surgeon: [name], MD;  Location: [facility];  Service: Gynecology;  Laterality: Right;   WISDOM TOOTH EXTRACTION  09/05/2022   four    No current facility-administered medications on file prior to encounter.   Current Outpatient Medications on File Prior to Encounter  Medication Sig Dispense Refill   famotidine (PEPCID) 20 MG tablet Take 1 tablet (20 mg total) by mouth at bedtime. 30 tablet 3   ferrous sulfate 325 (65 FE) MG tablet Take 1 tablet (325 mg total) by mouth every other day. 60 tablet 3   Prenatal Vit-Fe Fumarate-FA (PRENATAL PO) Take by mouth.      Allergies  Allergen Reactions   Buspirone Anaphylaxis and Other (See Comments)    Dystonic reaction - arms, legs, jaw "locked up"  "lockjaw"  Jittery, lockjaw, tightened muscles    Social History   Socioeconomic History   Marital status: Single  Spouse name: Not on file   Number of children: 1   Years of education: 10   Highest education level: Not on file  Occupational History   Occupation: International aid/development worker at [employer]  Tobacco Use   Smoking status: Never   Smokeless tobacco: Never  Vaping Use   Vaping status: Former   Start date: 09/20/2019   Quit date: 10/16/2022   Substances: Nicotine, Flavoring   Substance and Sexual Activity   Alcohol use: No    Alcohol/week: 0.0 standard drinks of alcohol   Drug use: Not Currently    Frequency: 7.0 times per week    Types: Marijuana    Comment: has stopped for 10 days   Sexual activity: Yes    Partners: Male    Birth control/protection: None  Other Topics Concern   Not on file  Social History Narrative   Not on file   Social Determinants of Health   Financial Resource Strain: Low Risk  (10/30/2022)   Overall Financial Resource Strain (CARDIA)    Difficulty of Paying Living Expenses: Not very hard  Food Insecurity: Food Insecurity Present (10/30/2022)   Hunger Vital Sign    Worried About Running Out of Food in the Last Year: Sometimes true    Ran Out of Food in the Last Year: Never true  Transportation Needs: No Transportation Needs (10/30/2022)   PRAPARE - Administrator, Civil Service (Medical): No    Lack of Transportation (Non-Medical): No  Physical Activity: Unknown (10/30/2022)   Exercise Vital Sign    Days of Exercise per Week: 1 day    Minutes of Exercise per Session: Not on file  Recent Concern: Physical Activity - Insufficiently Active (10/30/2022)   Exercise Vital Sign    Days of Exercise per Week: 1 day    Minutes of Exercise per Session: 30 min  Stress: Stress Concern Present (10/30/2022)   [employer] of Occupational Health - Occupational Stress Questionnaire    Feeling of Stress : To some extent  Social Connections: Unknown (10/30/2022)   Social Connection and Isolation Panel [NHANES]    Frequency of Communication with Friends and Family: More than three times a week    Frequency of Social Gatherings with Friends and Family: Twice a week    Attends Religious Services: Never    Database administrator or Organizations: No    Attends Banker Meetings: Never    Marital Status: Not on file  Intimate Partner Violence: Not At Risk (10/30/2022)   Humiliation, Afraid, Rape, and Kick questionnaire     Fear of Current or Ex-Partner: No    Emotionally Abused: No    Physically Abused: No    Sexually Abused: No    Family History  Problem Relation Age of Onset   Hypertension Mother    Hypothyroidism Mother    Depression Mother    Healthy Father    Hypertension Sister    Healthy Sister    Thyroid disease Maternal Grandmother    Diabetes Maternal Grandmother    Thyroid disease Maternal Grandfather    Hypertension Paternal Grandmother    Healthy Paternal Grandfather      Review of Systems  Constitutional:  Negative for chills and fever.  HENT:  Negative for congestion, ear discharge, ear pain, hearing loss, sinus pain and sore throat.   Eyes:  Negative for blurred vision and double vision.  Respiratory:  Negative for cough, shortness of breath and wheezing.   Cardiovascular:  Negative  for chest pain, palpitations and leg swelling.  Gastrointestinal:  Positive for abdominal pain. Negative for blood in stool, constipation, diarrhea, heartburn, melena, nausea and vomiting.  Genitourinary:  Negative for dysuria, flank pain, frequency, hematuria and urgency.  Musculoskeletal:  Negative for back pain, joint pain and myalgias.  Skin:  Negative for itching and rash.  Neurological:  Negative for dizziness, tingling, tremors, sensory change, speech change, focal weakness, seizures, loss of consciousness, weakness and headaches.  Endo/Heme/Allergies:  Negative for environmental allergies. Does not bruise/bleed easily.  Psychiatric/Behavioral:  Negative for depression, hallucinations, memory loss, substance abuse and suicidal ideas. The patient is not nervous/anxious and does not have insomnia.      Physical Exam: Vital Signs:  BP 115/75, P 92   LMP 08/31/2022 (Exact Date)  Constitutional: Well nourished, well developed female in no acute distress.  HEENT: normal Skin: Warm and dry.  Cardiovascular: Regular rate and rhythm.   Extremity: no edema Respiratory: Clear to auscultation  bilateral. Normal respiratory effort Abdomen: FHT present Psych: Alert and Oriented x3. No memory deficits. Normal mood and affect.   Fetal well being: 135 bpm, moderate variability, +accelerations, -decelerations Toco: negative  Consults: None  Significant Findings/ Diagnostic Studies: NA  Procedures: NST  Hospital Course: The patient was admitted to Labor and Delivery Triage for observation.   Discharge Condition: good  Disposition: Discharge disposition: 01-Home or Self Care  Diet: Regular diet  Discharge Activity: Activity as tolerated   Allergies as of 04/26/2023       Reactions   Buspirone Anaphylaxis, Other (See Comments)   Dystonic reaction - arms, legs, jaw "locked up" "lockjaw" Jittery, lockjaw, tightened muscles        Medication List     TAKE these medications    famotidine 20 MG tablet Commonly known as: PEPCID Take 1 tablet (20 mg total) by mouth at bedtime.   ferrous sulfate 325 (65 FE) MG tablet Take 1 tablet (325 mg total) by mouth every other day.   PRENATAL PO Take by mouth.         Total time spent taking care of this patient: 20 minutes  Signed: Tresea Mall, CNM  04/26/2023, 1:50 AM

## 2023-05-07 ENCOUNTER — Ambulatory Visit (INDEPENDENT_AMBULATORY_CARE_PROVIDER_SITE_OTHER): Payer: Medicaid Other | Admitting: Certified Nurse Midwife

## 2023-05-07 ENCOUNTER — Observation Stay
Admission: EM | Admit: 2023-05-07 | Discharge: 2023-05-07 | Disposition: A | Discharge status: Home / Self Care | Payer: Medicaid Other | Attending: Obstetrics and Gynecology | Admitting: Obstetrics and Gynecology

## 2023-05-07 ENCOUNTER — Other Ambulatory Visit: Payer: Self-pay

## 2023-05-07 VITALS — BP 125/83 | HR 93 | Wt 180.5 lb

## 2023-05-07 DIAGNOSIS — Z3480 Encounter for supervision of other normal pregnancy, unspecified trimester: Principal | ICD-10-CM

## 2023-05-07 DIAGNOSIS — O26893 Other specified pregnancy related conditions, third trimester: Secondary | ICD-10-CM | POA: Diagnosis present

## 2023-05-07 DIAGNOSIS — Z3A34 34 weeks gestation of pregnancy: Secondary | ICD-10-CM | POA: Diagnosis not present

## 2023-05-07 DIAGNOSIS — O9A213 Injury, poisoning and certain other consequences of external causes complicating pregnancy, third trimester: Secondary | ICD-10-CM

## 2023-05-07 DIAGNOSIS — D649 Anemia, unspecified: Secondary | ICD-10-CM

## 2023-05-07 DIAGNOSIS — R109 Unspecified abdominal pain: Secondary | ICD-10-CM | POA: Insufficient documentation

## 2023-05-07 DIAGNOSIS — O09899 Supervision of other high risk pregnancies, unspecified trimester: Secondary | ICD-10-CM

## 2023-05-07 MED ORDER — ACETAMINOPHEN 500 MG PO TABS: 1000.0000 mg | ORAL_TABLET | Freq: Four times a day (QID) | ORAL | Status: DC | PRN

## 2023-05-07 MED ORDER — ACETAMINOPHEN 500 MG PO TABS
1000.0000 mg | ORAL_TABLET | Freq: Four times a day (QID) | ORAL | Status: DC | PRN
  Administered 2023-05-07: 1000 mg via ORAL
  Filled 2023-05-07: qty 2

## 2023-05-07 NOTE — Progress Notes (Signed)
ROB doing well, feeling good movement. No concerns today. Discussed braxton hicks ctx vs PTL. She verbalizes understanding. Reviewed GBS swabs next visit. Follow up as scheduled with Dr. Logan Bores on 05/21/23.  Doreene Burke, CNM

## 2023-05-07 NOTE — Patient Instructions (Signed)
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
## 2023-05-07 NOTE — Progress Notes (Signed)
Discharge instructions provided to patient. Patient verbalized understanding. Pt educated on signs and symptoms of labor, vaginal bleeding, LOF, and fetal movement. Red flag signs reviewed by RN. Patient discharged home with family member in stable condition.

## 2023-05-13 ENCOUNTER — Ambulatory Visit (INDEPENDENT_AMBULATORY_CARE_PROVIDER_SITE_OTHER): Payer: Medicaid Other | Admitting: Obstetrics and Gynecology

## 2023-05-13 ENCOUNTER — Other Ambulatory Visit (HOSPITAL_COMMUNITY)
Admission: RE | Admit: 2023-05-13 | Discharge: 2023-05-13 | Disposition: A | Discharge status: Home / Self Care | Payer: Medicaid Other | Source: Ambulatory Visit | Attending: Obstetrics and Gynecology | Admitting: Obstetrics and Gynecology

## 2023-05-13 ENCOUNTER — Encounter: Payer: Self-pay | Admitting: Obstetrics and Gynecology

## 2023-05-13 VITALS — BP 142/97 | HR 86 | Wt 182.7 lb

## 2023-05-13 DIAGNOSIS — Z113 Encounter for screening for infections with a predominantly sexual mode of transmission: Secondary | ICD-10-CM | POA: Insufficient documentation

## 2023-05-13 DIAGNOSIS — Z3A35 35 weeks gestation of pregnancy: Secondary | ICD-10-CM | POA: Diagnosis present

## 2023-05-13 DIAGNOSIS — Z3480 Encounter for supervision of other normal pregnancy, unspecified trimester: Secondary | ICD-10-CM | POA: Insufficient documentation

## 2023-05-13 DIAGNOSIS — Z3483 Encounter for supervision of other normal pregnancy, third trimester: Secondary | ICD-10-CM

## 2023-05-13 DIAGNOSIS — R519 Headache, unspecified: Secondary | ICD-10-CM

## 2023-05-13 DIAGNOSIS — Z3685 Encounter for antenatal screening for Streptococcus B: Secondary | ICD-10-CM

## 2023-05-13 LAB — POCT URINALYSIS DIPSTICK OB
Bilirubin, UA: NEGATIVE
Blood, UA: NEGATIVE
Glucose, UA: NEGATIVE
Ketones, UA: NEGATIVE
Leukocytes, UA: NEGATIVE
Nitrite, UA: NEGATIVE
Spec Grav, UA: 1.01 (ref 1.010–1.025)
Urobilinogen, UA: 0.2 U/dL
pH, UA: 6.5 (ref 5.0–8.0)

## 2023-05-13 NOTE — Addendum Note (Signed)
Addended by: Georgiana Shore R on: 05/13/2023 11:24 AM   Modules accepted: Orders

## 2023-05-13 NOTE — Progress Notes (Signed)
ROB. Patient states daily fetal movement along with pressure. Reports since Sunday evening experiencing migraines and edema, taking tylenol prn with slight relief. GC/CT and GBS cultures ordered. Patient states no questions or concerns at this time.

## 2023-05-13 NOTE — Telephone Encounter (Signed)
Patient scheduled at 10:35 am today with Dr. Logan Bores

## 2023-05-13 NOTE — Progress Notes (Signed)
ROB: She presents today because she had swelling in her feet last night and developed a headache this morning.  Her blood pressure is elevated today.  Trace proteinuria.  She reports that her headache has now mostly resolved with use of Tylenol.  She reports that her lower extremity edema has also resolved.  She reports daily fetal movement.  She states that she has had heartburn with her entire pregnancy.  She did not have preeclampsia with her first baby.  Same father of this baby. CBC, CMP, PC ratio ordered. Signs and symptoms of preeclampsia discussed.  Patient to return on Thursday as scheduled for blood pressure recheck and visit to discuss today's results. Plan was for 39-week cesarean delivery (repeat)

## 2023-05-14 ENCOUNTER — Other Ambulatory Visit: Payer: Self-pay

## 2023-05-14 ENCOUNTER — Observation Stay
Admission: EM | Admit: 2023-05-14 | Discharge: 2023-05-15 | Disposition: A | Discharge status: Home / Self Care | Payer: Medicaid Other | Attending: Obstetrics and Gynecology | Admitting: Obstetrics and Gynecology

## 2023-05-14 ENCOUNTER — Encounter: Payer: Self-pay | Admitting: Obstetrics and Gynecology

## 2023-05-14 DIAGNOSIS — Z79899 Other long term (current) drug therapy: Secondary | ICD-10-CM | POA: Insufficient documentation

## 2023-05-14 DIAGNOSIS — Z98891 History of uterine scar from previous surgery: Secondary | ICD-10-CM | POA: Diagnosis not present

## 2023-05-14 DIAGNOSIS — D649 Anemia, unspecified: Secondary | ICD-10-CM

## 2023-05-14 DIAGNOSIS — Z87891 Personal history of nicotine dependence: Secondary | ICD-10-CM | POA: Insufficient documentation

## 2023-05-14 DIAGNOSIS — O133 Gestational [pregnancy-induced] hypertension without significant proteinuria, third trimester: Secondary | ICD-10-CM | POA: Diagnosis not present

## 2023-05-14 DIAGNOSIS — Z3480 Encounter for supervision of other normal pregnancy, unspecified trimester: Principal | ICD-10-CM

## 2023-05-14 DIAGNOSIS — Z3A36 36 weeks gestation of pregnancy: Secondary | ICD-10-CM | POA: Diagnosis not present

## 2023-05-14 DIAGNOSIS — O163 Unspecified maternal hypertension, third trimester: Secondary | ICD-10-CM | POA: Diagnosis present

## 2023-05-14 DIAGNOSIS — Z2839 Other underimmunization status: Secondary | ICD-10-CM

## 2023-05-14 LAB — URINALYSIS, ROUTINE W REFLEX MICROSCOPIC
Bilirubin Urine: NEGATIVE
Glucose, UA: NEGATIVE mg/dL
Hgb urine dipstick: NEGATIVE
Ketones, ur: NEGATIVE mg/dL
Nitrite: NEGATIVE
Protein, ur: NEGATIVE mg/dL
Specific Gravity, Urine: 1.005 (ref 1.005–1.030)
pH: 8 (ref 5.0–8.0)

## 2023-05-14 LAB — COMPREHENSIVE METABOLIC PANEL
ALT: 12 [IU]/L (ref 0–32)
ALT: 14 U/L (ref 0–44)
AST: 14 [IU]/L (ref 0–40)
AST: 17 U/L (ref 15–41)
Albumin: 3.4 g/dL — ABNORMAL LOW (ref 4.0–5.0)
Albumin: 2.8 g/dL — ABNORMAL LOW (ref 3.5–5.0)
Alkaline Phosphatase: 134 [IU]/L — ABNORMAL HIGH (ref 44–121)
Alkaline Phosphatase: 107 U/L (ref 38–126)
Anion gap: 8 (ref 5–15)
BUN/Creatinine Ratio: 7 — ABNORMAL LOW (ref 9–23)
BUN: 5 mg/dL — ABNORMAL LOW (ref 6–20)
BUN: 6 mg/dL (ref 6–20)
Bilirubin Total: <0.2 mg/dL (ref 0.0–1.2)
CO2: 18 mmol/L — ABNORMAL LOW (ref 20–29)
CO2: 23 mmol/L (ref 22–32)
Calcium: 9.3 mg/dL (ref 8.7–10.2)
Calcium: 8.9 mg/dL (ref 8.9–10.3)
Chloride: 105 mmol/L (ref 96–106)
Chloride: 104 mmol/L (ref 98–111)
Creatinine, Ser: 0.7 mg/dL (ref 0.57–1.00)
Creatinine, Ser: 0.67 mg/dL (ref 0.44–1.00)
GFR, Estimated: >60 mL/min (ref >60)
Globulin, Total: 2.7 g/dL (ref 1.5–4.5)
Glucose, Bld: 94 mg/dL (ref 70–99)
Glucose: 73 mg/dL (ref 70–99)
Potassium: 3.6 mmol/L (ref 3.5–5.2)
Potassium: 3.2 mmol/L — ABNORMAL LOW (ref 3.5–5.1)
Sodium: 140 mmol/L (ref 134–144)
Sodium: 135 mmol/L (ref 135–145)
Total Bilirubin: 0.4 mg/dL (ref 0.3–1.2)
Total Protein: 6.1 g/dL (ref 6.0–8.5)
Total Protein: 6.4 g/dL — ABNORMAL LOW (ref 6.5–8.1)
eGFR: 121 mL/min/{1.73_m2} (ref >59)

## 2023-05-14 LAB — CBC
HCT: 28.6 % — ABNORMAL LOW (ref 36.0–46.0)
Hematocrit: 30.1 % — ABNORMAL LOW (ref 34.0–46.6)
Hemoglobin: 9.8 g/dL — ABNORMAL LOW (ref 11.1–15.9)
Hemoglobin: 9.6 g/dL — ABNORMAL LOW (ref 12.0–15.0)
MCH: 27.6 pg (ref 26.6–33.0)
MCH: 27.8 pg (ref 26.0–34.0)
MCHC: 32.6 g/dL (ref 31.5–35.7)
MCHC: 33.6 g/dL (ref 30.0–36.0)
MCV: 85 fL (ref 79–97)
MCV: 82.9 fL (ref 80.0–100.0)
Platelets: 229 10*3/uL (ref 150–450)
Platelets: 245 10*3/uL (ref 150–400)
RBC: 3.55 x10E6/uL — ABNORMAL LOW (ref 3.77–5.28)
RBC: 3.45 MIL/uL — ABNORMAL LOW (ref 3.87–5.11)
RDW: 14.3 % (ref 11.7–15.4)
RDW: 15.3 % (ref 11.5–15.5)
WBC: 8.8 10*3/uL (ref 3.4–10.8)
WBC: 10.2 10*3/uL (ref 4.0–10.5)
nRBC: 0 % (ref 0.0–0.2)

## 2023-05-14 LAB — PROTEIN / CREATININE RATIO, URINE
Creatinine, Urine: 36.3 mg/dL
Creatinine, Urine: 58 mg/dL
Protein Creatinine Ratio: 0.19 mg/mg{creat} — ABNORMAL HIGH (ref 0.00–0.15)
Protein, Ur: 8.8 mg/dL
Protein/Creat Ratio: 242 mg/g{creat} — ABNORMAL HIGH (ref 0–200)
Total Protein, Urine: 11 mg/dL

## 2023-05-14 LAB — CERVICOVAGINAL ANCILLARY ONLY
Chlamydia: NEGATIVE
Comment: NEGATIVE
Comment: NORMAL
Neisseria Gonorrhea: NEGATIVE

## 2023-05-14 MED ORDER — LABETALOL HCL 100 MG PO TABS
200.0000 mg | ORAL_TABLET | Freq: Two times a day (BID) | ORAL | Status: DC
  Administered 2023-05-14: 200 mg via ORAL

## 2023-05-14 MED ORDER — LABETALOL HCL 100 MG PO TABS
ORAL_TABLET | ORAL | Status: AC
  Filled 2023-05-14: qty 2

## 2023-05-14 NOTE — OB Triage Note (Signed)
Patient is [redacted]w[redacted]d is G2P1 seen at [facility] is a precious c section x1. Is c/o high blood pressures. They have been elevated within the last couple weeks and was seen in office yesterday for [facility] labs and work up. PC ratio was 242 and blood pressures have been elevated. Patient states no headache right now but has been intermittent. States some abnormal swelling as well. Denies urinary symptoms and denies LOF. Monitors applied and BP set to be every 15 minutes, PIH orders pending. Provider notified.

## 2023-05-15 ENCOUNTER — Ambulatory Visit: Payer: Medicaid Other | Admitting: Obstetrics and Gynecology

## 2023-05-15 DIAGNOSIS — Z3A36 36 weeks gestation of pregnancy: Secondary | ICD-10-CM

## 2023-05-15 DIAGNOSIS — Z3480 Encounter for supervision of other normal pregnancy, unspecified trimester: Secondary | ICD-10-CM

## 2023-05-15 DIAGNOSIS — Z113 Encounter for screening for infections with a predominantly sexual mode of transmission: Secondary | ICD-10-CM

## 2023-05-15 DIAGNOSIS — O133 Gestational [pregnancy-induced] hypertension without significant proteinuria, third trimester: Secondary | ICD-10-CM

## 2023-05-15 DIAGNOSIS — Z0130 Encounter for examination of blood pressure without abnormal findings: Secondary | ICD-10-CM

## 2023-05-15 DIAGNOSIS — Z3685 Encounter for antenatal screening for Streptococcus B: Secondary | ICD-10-CM

## 2023-05-15 LAB — STREP GP B NAA: Strep Gp B NAA: NEGATIVE

## 2023-05-15 MED ORDER — NIFEDIPINE ER OSMOTIC RELEASE 30 MG PO TB24: 30.0000 mg | ORAL_TABLET | Freq: Every day | ORAL | 2 refills | Status: DC

## 2023-05-15 NOTE — Discharge Summary (Signed)
DISCHARGE SUMMARY/   Final Progress Note  Patient ID: Tiffany Guzman MRN: [id number] DOB/AGE: 27/02/1996 27 y.o.  Admit date: 05/14/2023 Admitting provider: Mirna Mires, CNM Discharge date: 05/15/2023   Admission Diagnoses: GESTATIONAL HYPERTENSION [redacted] weeks gestation  Discharge Diagnoses:  Principal Problem:   Gestational hypertension, third trimester  Reactive fetal heart tones  History of Present Illness: The patient is a 27 y.o. female G2P1001 at [redacted]w[redacted]d who presents for complaint of concern for elevated blood pressures she took at home earlier this evening. Marland KitchenShe was seen in the office yesterday for a prenatal visit and her blood pressures were elevated. A PIH panel was ordered and she planned to RTC tomorrow on Thursday  for another BP check and to f/u on her labwork. She opted to present to Labor and Delivery  after taking her BPs at home. Her OB history is noted for a previous Cesarean section. She is scheduled for a repeat CS on November 11th. She denies anyu contractions; reports ample fetal movement. Upon her arrival, she also denies a headache and does not have any noticeable edema.  Past Medical History:  Diagnosis Date   Acne    Anxiety    Attention deficit hyperactivity disorder (ADHD)    Chronic upper back pain    lower and upper back pain   Depression    Dyspnea    GERD (gastroesophageal reflux disease)    Gestational hypertension 11/14/2017   Migraine with aura    since elementary school   Postoperative nausea and vomiting 02/27/2021    Past Surgical History:  Procedure Laterality Date   CESAREAN SECTION N/A 11/14/2017   Procedure: CESAREAN SECTION;  Surgeon: [name], MD;  Location: [facility] ORS;  Service: Obstetrics;  Laterality: N/A;   CHOLECYSTECTOMY     ESOPHAGOGASTRODUODENOSCOPY Left 02/17/2019   Procedure: ESOPHAGOGASTRODUODENOSCOPY (EGD);  Surgeon: Pasty Spillers, MD;  Location: [facility] ENDOSCOPY;  Service: Endoscopy;  Laterality:  Left;   INSERTION OF NON VAGINAL CONTRACEPTIVE DEVICE     LAPAROSCOPIC OVARIAN CYSTECTOMY Right 02/23/2019   Procedure: LAPAROSCOPIC OVARIAN CYSTECTOMY;  Surgeon: Natale Milch, MD;  Location: [facility] ORS;  Service: Gynecology;  Laterality: Right;   LAPAROSCOPY N/A 02/25/2021   Procedure: LAPAROSCOPY DIAGNOSTIC;  Surgeon: [name], MD;  Location: [facility] ORS;  Service: Gynecology;  Laterality: N/A;   OOPHORECTOMY Left 10/03/2017   Procedure: OOPHORECTOMY;  Surgeon: Natale Milch, MD;  Location: [facility] ORS;  Service: Gynecology;  Laterality: Left;   OVARIAN CYST REMOVAL Left 10/03/2017   Procedure: OVARIAN CYSTECTOMY;  Surgeon: Natale Milch, MD;  Location: [facility] ORS;  Service: Gynecology;  Laterality: Left;   OVARIAN CYST REMOVAL Right 02/25/2021   Procedure: OVARIAN CYSTECTOMY;  Surgeon: [name], MD;  Location: [facility] ORS;  Service: Gynecology;  Laterality: Right;   WISDOM TOOTH EXTRACTION  09/05/2022   four    No current facility-administered medications on file prior to encounter.   Current Outpatient Medications on File Prior to Encounter  Medication Sig Dispense Refill   acetaminophen (TYLENOL) 500 MG tablet Take 2 tablets (1,000 mg total) by mouth every 6 (six) hours as needed for fever or headache.     famotidine (PEPCID) 20 MG tablet Take 1 tablet (20 mg total) by mouth at bedtime. 30 tablet 3   ferrous sulfate 325 (65 FE) MG tablet Take 1 tablet (325 mg total) by mouth every other day. 60 tablet 3   Prenatal Vit-Fe Fumarate-FA (PRENATAL PO) Take by mouth.  Allergies  Allergen Reactions   Buspirone Anaphylaxis and Other (See Comments)    Dystonic reaction - arms, legs, jaw "locked up"  "lockjaw"  Jittery, lockjaw, tightened muscles    Social History   Socioeconomic History   Marital status: Single    Spouse name: Not on file   Number of children: 1   Years of education: 10   Highest education level: Not on file  Occupational  History   Occupation: International aid/development worker at [employer]  Tobacco Use   Smoking status: Never   Smokeless tobacco: Never  Vaping Use   Vaping status: Former   Start date: 09/20/2019   Quit date: 10/16/2022   Substances: Nicotine, Flavoring  Substance and Sexual Activity   Alcohol use: No    Alcohol/week: 0.0 standard drinks of alcohol   Drug use: Not Currently    Frequency: 7.0 times per week    Types: Marijuana    Comment: has stopped for 10 days   Sexual activity: Yes    Partners: Male    Birth control/protection: None  Other Topics Concern   Not on file  Social History Narrative   Not on file   Social Determinants of Health   Financial Resource Strain: Low Risk  (10/30/2022)   Overall Financial Resource Strain (CARDIA)    Difficulty of Paying Living Expenses: Not very hard  Food Insecurity: Food Insecurity Present (10/30/2022)   Hunger Vital Sign    Worried About Running Out of Food in the Last Year: Sometimes true    Ran Out of Food in the Last Year: Never true  Transportation Needs: No Transportation Needs (10/30/2022)   PRAPARE - Administrator, Civil Service (Medical): No    Lack of Transportation (Non-Medical): No  Physical Activity: Unknown (10/30/2022)   Exercise Vital Sign    Days of Exercise per Week: 1 day    Minutes of Exercise per Session: Not on file  Recent Concern: Physical Activity - Insufficiently Active (10/30/2022)   Exercise Vital Sign    Days of Exercise per Week: 1 day    Minutes of Exercise per Session: 30 min  Stress: Stress Concern Present (10/30/2022)   [employer] of Occupational Health - Occupational Stress Questionnaire    Feeling of Stress : To some extent  Social Connections: Unknown (10/30/2022)   Social Connection and Isolation Panel [NHANES]    Frequency of Communication with Friends and Family: More than three times a week    Frequency of Social Gatherings with Friends and Family: Twice a week    Attends Religious  Services: Never    Database administrator or Organizations: No    Attends Banker Meetings: Never    Marital Status: Not on file  Intimate Partner Violence: Not At Risk (10/30/2022)   Humiliation, Afraid, Rape, and Kick questionnaire    Fear of Current or Ex-Partner: No    Emotionally Abused: No    Physically Abused: No    Sexually Abused: No    Family History  Problem Relation Age of Onset   Hypertension Mother    Hypothyroidism Mother    Depression Mother    Healthy Father    Hypertension Sister    Healthy Sister    Thyroid disease Maternal Grandmother    Diabetes Maternal Grandmother    Thyroid disease Maternal Grandfather    Hypertension Paternal Grandmother    Healthy Paternal Grandfather      Review of Systems  Constitutional: Negative.  HENT: Negative.    Eyes: Negative.   Respiratory: Negative.    Cardiovascular: Negative.   Gastrointestinal: Negative.   Genitourinary: Negative.   Musculoskeletal: Negative.   Skin: Negative.   Neurological: Negative.   Endo/Heme/Allergies: Negative.   Psychiatric/Behavioral: Negative.       Physical Exam: BP 120/71   Pulse 82   Temp 98.6 F (37 C) (Oral)   LMP 08/31/2022 (Exact Date)   Physical Exam Constitutional:      Appearance: Normal appearance.  Genitourinary:     Genitourinary Comments: Exam deferred  Cardiovascular:     Rate and Rhythm: Normal rate and regular rhythm.     Pulses: Normal pulses.     Heart sounds: Normal heart sounds.  Pulmonary:     Effort: Pulmonary effort is normal.     Breath sounds: Normal breath sounds.  Abdominal:     Comments: Gravid abdomen  Musculoskeletal:        General: Normal range of motion.     Cervical back: Normal range of motion and neck supple.  Neurological:     General: No focal deficit present.     Mental Status: She is alert and oriented to person, place, and time.  Skin:    General: Skin is warm and dry.  Psychiatric:        Mood and Affect:  Mood normal.        Behavior: Behavior normal.     Consults: None  Significant Findings/ Diagnostic Studies: labs:  Results for orders placed or performed during the hospital encounter of 05/14/23 (from the past 24 hour(s))  Protein / creatinine ratio, urine     Status: Abnormal   Collection Time: 05/14/23 10:01 PM  Result Value Ref Range   Creatinine, Urine 58 mg/dL   Total Protein, Urine 11 mg/dL   Protein Creatinine Ratio 0.19 (H) 0.00 - 0.15 mg/mg[Cre]  Urinalysis, Routine w reflex microscopic -Urine, Clean Catch     Status: Abnormal   Collection Time: 05/14/23 10:01 PM  Result Value Ref Range   Color, Urine STRAW (A) YELLOW   APPearance HAZY (A) CLEAR   Specific Gravity, Urine 1.005 1.005 - 1.030   pH 8.0 5.0 - 8.0   Glucose, UA NEGATIVE NEGATIVE mg/dL   Hgb urine dipstick NEGATIVE NEGATIVE   Bilirubin Urine NEGATIVE NEGATIVE   Ketones, ur NEGATIVE NEGATIVE mg/dL   Protein, ur NEGATIVE NEGATIVE mg/dL   Nitrite NEGATIVE NEGATIVE   Leukocytes,Ua SMALL (A) NEGATIVE   RBC / HPF 0-5 0 - 5 RBC/hpf   WBC, UA 0-5 0 - 5 WBC/hpf   Bacteria, UA RARE (A) NONE SEEN   Squamous Epithelial / HPF 11-20 0 - 5 /HPF   Mucus PRESENT   CBC     Status: Abnormal   Collection Time: 05/14/23 10:12 PM  Result Value Ref Range   WBC 10.2 4.0 - 10.5 K/uL   RBC 3.45 (L) 3.87 - 5.11 MIL/uL   Hemoglobin 9.6 (L) 12.0 - 15.0 g/dL   HCT 16.1 (L) 09.6 - 04.5 %   MCV 82.9 80.0 - 100.0 fL   MCH 27.8 26.0 - 34.0 pg   MCHC 33.6 30.0 - 36.0 g/dL   RDW 40.9 81.1 - 91.4 %   Platelets 245 150 - 400 K/uL   nRBC 0.0 0.0 - 0.2 %  Comprehensive metabolic panel     Status: Abnormal   Collection Time: 05/14/23 10:12 PM  Result Value Ref Range   Sodium 135 135 - 145 mmol/L  Potassium 3.2 (L) 3.5 - 5.1 mmol/L   Chloride 104 98 - 111 mmol/L   CO2 23 22 - 32 mmol/L   Glucose, Bld 94 70 - 99 mg/dL   BUN 6 6 - 20 mg/dL   Creatinine, Ser 1.61 0.44 - 1.00 mg/dL   Calcium 8.9 8.9 - 09.6 mg/dL   Total Protein 6.4  (L) 6.5 - 8.1 g/dL   Albumin 2.8 (L) 3.5 - 5.0 g/dL   AST 17 15 - 41 U/L   ALT 14 0 - 44 U/L   Alkaline Phosphatase 107 38 - 126 U/L   Total Bilirubin 0.4 0.3 - 1.2 mg/dL   GFR, Estimated >04 >54 mL/min   Anion gap 8 5 - 15     Procedures: EFM NST Baseline FHR: 130 beats/min Variability: moderate Accelerations: present Decelerations: absent Tocometry: no contractions  Interpretation:  INDICATIONS: patient reassurance RESULTS:  A NST procedure was performed with FHR monitoring and a normal baseline established, appropriate time of 20-40 minutes of evaluation, and accels >2 seen w 15x15 characteristics.  Results show a REACTIVE NST.    Hospital Course: The patient was admitted to Labor and Delivery Triage for observation. Serial blood pressures were taken and her pressures were noted to be elevated. An NST was reactive and Category 1. A PIH panel was drawn and her results were WNL. Blood pressure was addressed with Labetalol, and within the hour, her pressures returned to normal. With reassuring Bps, she is discharged home, with plans to see Dr. Logan Bores in the morning for a prenatal visit and another blood pressure check. A prescription for Procardia 30 XL has been sent to her pharmacy, and she is instructed to start this medication in the morning.  Discharge Condition: good  Disposition: Discharge disposition: 01-Home or Self Care       Diet: Regular diet  Discharge Activity: Bedrest  Discharge Instructions     Fetal Kick Count:  Lie on our left side for one hour after a meal, and count the number of times your baby kicks.  If it is less than 5 times, get up, move around and drink some juice.  Repeat the test 30 minutes later.  If it is still less than 5 kicks in an hour, notify your doctor.   Complete by: As directed    LABOR:  When conractions begin, you should start to time them from the beginning of one contraction to the beginning  of the next.  When contractions are 5  - 10 minutes apart or less and have been regular for at least an hour, you should call your health care provider.   Complete by: As directed    Notify physician for bleeding from the vagina   Complete by: As directed    Notify physician for blurring of vision or spots before the eyes   Complete by: As directed    Notify physician for chills or fever   Complete by: As directed    Notify physician for fainting spells, "black outs" or loss of consciousness   Complete by: As directed    Notify physician for increase in vaginal discharge   Complete by: As directed    Notify physician for leaking of fluid   Complete by: As directed    Notify physician for pain or burning when urinating   Complete by: As directed    Notify physician for pelvic pressure (sudden increase)   Complete by: As directed    Notify physician for severe or  continued nausea or vomiting   Complete by: As directed    Notify physician for sudden gushing of fluid from the vagina (with or without continued leaking)   Complete by: As directed    Notify physician for sudden, constant, or occasional abdominal pain   Complete by: As directed    Notify physician if baby moving less than usual   Complete by: As directed       Allergies as of 05/15/2023       Reactions   Buspirone Anaphylaxis, Other (See Comments)   Dystonic reaction - arms, legs, jaw "locked up" "lockjaw" Jittery, lockjaw, tightened muscles        Medication List     TAKE these medications    acetaminophen 500 MG tablet Commonly known as: TYLENOL Take 2 tablets (1,000 mg total) by mouth every 6 (six) hours as needed for fever or headache.   famotidine 20 MG tablet Commonly known as: PEPCID Take 1 tablet (20 mg total) by mouth at bedtime.   ferrous sulfate 325 (65 FE) MG tablet Take 1 tablet (325 mg total) by mouth every other day.   NIFEdipine 30 MG 24 hr tablet Commonly known as: PROCARDIA-XL/NIFEDICAL-XL Take 1 tablet (30 mg total) by  mouth daily. Can increase to twice a day as needed for symptomatic contractions   PRENATAL PO Take by mouth.         Total time spent taking care of this patient: 60 minutes  Signed: Mirna Mires, CNM  05/15/2023, 12:26 AM

## 2023-05-15 NOTE — Progress Notes (Signed)
ROB. Patient presents for BP check today. States she went to L&D last night and was given labetalol to bring her BP down, RX for procardia sent to pharmacy. She states her headaches and edema have gotten better since last visit.  She states no questions or concerns at this time.

## 2023-05-15 NOTE — Final Progress Note (Signed)
   Please see Discharge Summary  Mirna Mires, CNM  05/15/2023 12:26 AM

## 2023-05-15 NOTE — Progress Notes (Signed)
ROB: 2 days ago patient had hypertension in the office and preeclamptic labs were sent.  She saw the labs on MyChart and did not know how to interpret them so she "panicked" and went to labor and delivery last night to find out if she had anything serious.  She received a dose of labetalol which immediately fix her blood pressures and was given a prescription for Procardia to take for the remainder of her pregnancy.  She presents today and her blood pressure is reasonable on no medication because she has not yet started the Procardia.  She is likely a gestational hypertensive patient.  We have discussed this in some detail and she will take Procardia 30 mg XL for the remainder of her pregnancy.  She is scheduled for a repeat cesarean delivery at 39 weeks.  Superimposed preeclampsia and chronic hypertension were discussed in detail.  All of her questions were answered.

## 2023-05-21 ENCOUNTER — Ambulatory Visit: Payer: Medicaid Other | Admitting: Obstetrics and Gynecology

## 2023-05-21 ENCOUNTER — Encounter: Payer: Medicaid Other | Admitting: Licensed Practical Nurse

## 2023-05-21 ENCOUNTER — Encounter: Payer: Self-pay | Admitting: Obstetrics and Gynecology

## 2023-05-21 VITALS — BP 130/86 | HR 83 | Wt 185.7 lb

## 2023-05-21 DIAGNOSIS — Z3A36 36 weeks gestation of pregnancy: Secondary | ICD-10-CM

## 2023-05-21 DIAGNOSIS — O133 Gestational [pregnancy-induced] hypertension without significant proteinuria, third trimester: Secondary | ICD-10-CM

## 2023-05-21 DIAGNOSIS — Z3480 Encounter for supervision of other normal pregnancy, unspecified trimester: Secondary | ICD-10-CM

## 2023-05-21 LAB — POCT URINALYSIS DIPSTICK OB
Bilirubin, UA: NEGATIVE
Blood, UA: NEGATIVE
Glucose, UA: NEGATIVE
Ketones, UA: NEGATIVE
Leukocytes, UA: NEGATIVE
Nitrite, UA: NEGATIVE
Spec Grav, UA: 1.01 (ref 1.010–1.025)
Urobilinogen, UA: 0.2 U/dL
pH, UA: 6.5 (ref 5.0–8.0)

## 2023-05-21 NOTE — Progress Notes (Signed)
ROB. Patient states daily fetal movement along with pressure. She reports taking Procardia 30 mg daily, no headaches to report now. She states no questions or concerns at this time.

## 2023-05-21 NOTE — Progress Notes (Signed)
ROB: She denies problems.  States that she has had 1 "real" contraction in the last week.  Reports daily fetal movement.  Still desires permanent sterilization at time of cesarean delivery.  Cesarean delivery has already been scheduled for the 15th.  Blood pressures currently controlled with a single dose of Procardia daily.  Cultures are negative

## 2023-05-28 ENCOUNTER — Ambulatory Visit (INDEPENDENT_AMBULATORY_CARE_PROVIDER_SITE_OTHER): Payer: Medicaid Other | Admitting: Obstetrics

## 2023-05-28 VITALS — BP 131/84 | HR 86 | Wt 184.0 lb

## 2023-05-28 DIAGNOSIS — Z3A37 37 weeks gestation of pregnancy: Secondary | ICD-10-CM

## 2023-05-28 DIAGNOSIS — Z3480 Encounter for supervision of other normal pregnancy, unspecified trimester: Secondary | ICD-10-CM

## 2023-05-28 DIAGNOSIS — O133 Gestational [pregnancy-induced] hypertension without significant proteinuria, third trimester: Secondary | ICD-10-CM

## 2023-05-28 LAB — POCT URINALYSIS DIPSTICK OB
Bilirubin, UA: NEGATIVE
Blood, UA: NEGATIVE
Glucose, UA: NEGATIVE
Ketones, UA: NEGATIVE
Nitrite, UA: NEGATIVE
POC,PROTEIN,UA: NEGATIVE
Urobilinogen, UA: 0.2 U/dL
pH, UA: 7.5 (ref 5.0–8.0)
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
## 2023-05-29 ENCOUNTER — Encounter
Admission: RE | Admit: 2023-05-29 | Discharge: 2023-05-29 | Disposition: A | Discharge status: Home / Self Care | Payer: Medicaid Other | Source: Ambulatory Visit | Attending: Obstetrics and Gynecology | Admitting: Obstetrics and Gynecology

## 2023-05-29 DIAGNOSIS — Z01812 Encounter for preprocedural laboratory examination: Secondary | ICD-10-CM

## 2023-05-29 DIAGNOSIS — E876 Hypokalemia: Secondary | ICD-10-CM

## 2023-05-29 HISTORY — DX: Family history of other specified conditions: Z84.89

## 2023-05-29 HISTORY — DX: Elevated white blood cell count, unspecified: D72.829

## 2023-05-29 HISTORY — DX: Bradycardia, unspecified: R00.1

## 2023-05-29 HISTORY — DX: Irritable bowel syndrome, unspecified: K58.9

## 2023-05-29 HISTORY — DX: Personal history of other diseases of the female genital tract: Z87.42

## 2023-05-29 HISTORY — DX: Anemia, unspecified: D64.9

## 2023-05-29 HISTORY — DX: Cannabis abuse, uncomplicated: F12.10

## 2023-05-29 NOTE — Patient Instructions (Addendum)
Your procedure is scheduled on:06-06-23 Friday  Arrival Time: 5:30 AM. Please call Labor and Delivery if you have any questions [phone].  Arrival: If your arrival time is prior to 6:00 am, please enter through the Emergency Room Entrance and you will be directed to Labor and Delivery. If your arrival time is 6:00 am or later, please enter the Medical Mall and follow the greeter's instructions.  REMEMBER: Instructions that are not followed completely may result in serious medical risk, up to and including death; or upon the discretion of your surgeon and anesthesiologist your surgery may need to be rescheduled.  Do not eat food OR drink any liquids after midnight the night before surgery.  No gum chewing or hard candies.  One week prior to surgery: Stop Anti-inflammatories (NSAIDS) such as Advil, Aleve, Ibuprofen, Motrin, Naproxen, Naprosyn and Aspirin based products such as Excedrin, Goody's Powder, BC Powder.  You may however, continue to take Tylenol if needed for pain up until the day of surgery.  Continue taking all of your other prescription medications up until the day of surgery.  ON THE DAY OF SURGERY ONLY TAKE THESE MEDICATIONS WITH SIPS OF WATER: -NIFEdipine (PROCARDIA-XL/NIFEDICAL-XL)   No Alcohol for 24 hours before or after surgery.  No Smoking including e-cigarettes for 24 hours prior to surgery.  No chewable tobacco products for at least 6 hours prior to surgery.  No nicotine patches on the day of surgery.  Do not use any "recreational" drugs for at least a week prior to your surgery.  Please be advised that the combination of cocaine and anesthesia may have negative outcomes, up to and including death. If you test positive for cocaine, your surgery will be cancelled.  On the morning of surgery brush your teeth with toothpaste and water, you may rinse your mouth with mouthwash if you wish. Do not swallow any toothpaste or mouthwash.  Use CHG soap as directed on  instruction sheet.(Avoid Nipple and Private area)  Do not wear jewelry, make-up, hairpins, clips or nail polish.  For welded (permanent) jewelry: bracelets, anklets, waist bands, etc.  Please have this removed prior to surgery.  If it is not removed, there is a chance that hospital personnel will need to cut it off on the day of surgery.  Do not wear lotions, powders, or perfumes.   Do not shave body hair from the neck down 48 hours before surgery.  Contact lenses, hearing aids and dentures may not be worn into surgery.  Do not bring valuables to the hospital. [facility] is not responsible for any missing/lost belongings or valuables.   Notify your doctor if there is any change in your medical condition (cold, fever, infection).  Wear comfortable clothing (specific to your surgery type) to the hospital.  After surgery, you can help prevent lung complications by doing breathing exercises.  Take deep breaths and cough every 1-2 hours. Your doctor may order a device called an Incentive Spirometer to help you take deep breaths. When coughing or sneezing, hold a pillow firmly against your incision with both hands. This is called "splinting." Doing this helps protect your incision. It also decreases belly discomfort.  Please call the Pre-admissions Testing Dept. at [phone] if you have any questions about these instructions.  Surgery Visitation Policy:  Visitor Passes   All visitors, including children, need an identification sticker when visiting. These stickers must be worn where they can be seen.   Labor & Delivery  Laboring women may have one designated  support person and two other visitors of any age visit. The support person must remain the same. The visitors may switch with other visitors. Visitation is permitted 24 hours per day. The designated support person or a visitor over the age of 16 may sleep overnight in the patient's room. A doula registered with [place]  for labor and delivery support is not considered a visitor. Doulas not registered with [place] are considered visitors.  Mother Baby Unit, OB Specialty and Gynecological Care  A designated support person and three visitors of any age may visit. The three visitors may switch out. The designated support person or a visitor age 72 or older may stay overnight in the room. During the postpartum period (up to 6 weeks), if the mother is the patient, she can have her newborn stay with her if there is another support person present who can be responsible for the baby.       Preparing for Surgery with CHLORHEXIDINE GLUCONATE (CHG) Soap  Chlorhexidine Gluconate (CHG) Soap  o An antiseptic cleaner that kills germs and bonds with the skin to continue killing germs even after washing  o Used for showering the night before surgery and morning of surgery  Before surgery, you can play an important role by reducing the number of germs on your skin.  CHG (Chlorhexidine gluconate) soap is an antiseptic cleanser which kills germs and bonds with the skin to continue killing germs even after washing.  Please do not use if you have an allergy to CHG or antibacterial soaps. If your skin becomes reddened/irritated stop using the CHG.  1. Shower the NIGHT BEFORE SURGERY and the MORNING OF SURGERY with CHG soap.  2. If you choose to wash your hair, wash your hair first as usual with your normal shampoo.  3. After shampooing, rinse your hair and body thoroughly to remove the shampoo.  4. Use CHG as you would any other liquid soap. You can apply CHG directly to the skin and wash gently with a scrungie or a clean washcloth.  5. Apply the CHG soap to your body only from the neck down. Do not use on open wounds or open sores. Avoid contact with your eyes, ears, mouth, and genitals (private parts). Wash face and genitals (private parts) with your normal soap.  6. Wash thoroughly, paying special attention to the  area where your surgery will be performed.  7. Thoroughly rinse your body with warm water.  8. Do not shower/wash with your normal soap after using and rinsing off the CHG soap.  9. Pat yourself dry with a clean towel.  10. Wear clean pajamas to bed the night before surgery.  12. Place clean sheets on your bed the night of your first shower and do not sleep with pets.  13. Shower again with the CHG soap on the day of surgery prior to arriving at the hospital.  14. Do not apply any deodorants/lotions/powders.  15. Please wear clean clothes to the hospital.

## 2023-06-03 ENCOUNTER — Ambulatory Visit (INDEPENDENT_AMBULATORY_CARE_PROVIDER_SITE_OTHER): Payer: Medicaid Other | Admitting: Obstetrics and Gynecology

## 2023-06-03 ENCOUNTER — Encounter: Payer: Self-pay | Admitting: Obstetrics and Gynecology

## 2023-06-03 VITALS — BP 154/97 | HR 73 | Wt 187.0 lb

## 2023-06-03 DIAGNOSIS — O133 Gestational [pregnancy-induced] hypertension without significant proteinuria, third trimester: Secondary | ICD-10-CM

## 2023-06-03 DIAGNOSIS — Z3A38 38 weeks gestation of pregnancy: Secondary | ICD-10-CM

## 2023-06-03 DIAGNOSIS — Z3480 Encounter for supervision of other normal pregnancy, unspecified trimester: Secondary | ICD-10-CM

## 2023-06-03 LAB — POCT URINALYSIS DIPSTICK OB
Bilirubin, UA: NEGATIVE
Blood, UA: NEGATIVE
Glucose, UA: NEGATIVE
Ketones, UA: NEGATIVE
Leukocytes, UA: NEGATIVE
Nitrite, UA: NEGATIVE
Spec Grav, UA: 1.01 (ref 1.010–1.025)
Urobilinogen, UA: 0.2 U/dL
pH, UA: 7 (ref 5.0–8.0)

## 2023-06-03 NOTE — Progress Notes (Signed)
ROB. She states daily fetal movement. Reports continuing to take Procardia daily, did have an elevated BP yesterday at home. Repeat cesarean scheduled for 11/15.

## 2023-06-03 NOTE — Progress Notes (Signed)
ROB: Scheduled for repeat cesarean delivery on Friday.  Patient has had 2 prior cesarean deliveries, diagnostic laparoscopy, left oophorectomy and gallbladder surgery.  Increased risk based on previous surgery discussed.  She is having a repeat cesarean delivery.  She reports daily fetal movement.  Has no questions about her surgery. Gestational hypertension on Procardia-currently has noted increase in blood pressures at home and they have are increased here in the office today.  Will increase Procardia to 30 twice daily.

## 2023-06-04 ENCOUNTER — Inpatient Hospital Stay: Payer: Medicaid Other | Admitting: General Practice

## 2023-06-04 ENCOUNTER — Encounter: Payer: Self-pay | Admitting: Obstetrics and Gynecology

## 2023-06-04 ENCOUNTER — Encounter: Admission: EM | Disposition: A | Discharge status: Home / Self Care | Payer: Self-pay | Source: Home / Self Care

## 2023-06-04 ENCOUNTER — Other Ambulatory Visit: Payer: Self-pay

## 2023-06-04 ENCOUNTER — Inpatient Hospital Stay
Admission: EM | Admit: 2023-06-04 | Discharge: 2023-06-07 | DRG: 784 | Disposition: A | Discharge status: Home / Self Care | Payer: Medicaid Other | Attending: Certified Nurse Midwife | Admitting: Certified Nurse Midwife

## 2023-06-04 ENCOUNTER — Inpatient Hospital Stay: Payer: Medicaid Other | Admitting: Urgent Care

## 2023-06-04 ENCOUNTER — Encounter
Admission: RE | Admit: 2023-06-04 | Discharge: 2023-06-04 | Disposition: A | Discharge status: Home / Self Care | Payer: Medicaid Other | Source: Ambulatory Visit | Attending: Obstetrics and Gynecology | Admitting: Obstetrics and Gynecology

## 2023-06-04 DIAGNOSIS — O1414 Severe pre-eclampsia complicating childbirth: Secondary | ICD-10-CM | POA: Diagnosis present

## 2023-06-04 DIAGNOSIS — E876 Hypokalemia: Secondary | ICD-10-CM | POA: Insufficient documentation

## 2023-06-04 DIAGNOSIS — O99283 Endocrine, nutritional and metabolic diseases complicating pregnancy, third trimester: Secondary | ICD-10-CM | POA: Insufficient documentation

## 2023-06-04 DIAGNOSIS — Z3A38 38 weeks gestation of pregnancy: Secondary | ICD-10-CM

## 2023-06-04 DIAGNOSIS — Z888 Allergy status to other drugs, medicaments and biological substances status: Secondary | ICD-10-CM

## 2023-06-04 DIAGNOSIS — Z818 Family history of other mental and behavioral disorders: Secondary | ICD-10-CM | POA: Diagnosis not present

## 2023-06-04 DIAGNOSIS — O99284 Endocrine, nutritional and metabolic diseases complicating childbirth: Secondary | ICD-10-CM | POA: Diagnosis present

## 2023-06-04 DIAGNOSIS — Z8249 Family history of ischemic heart disease and other diseases of the circulatory system: Secondary | ICD-10-CM

## 2023-06-04 DIAGNOSIS — K589 Irritable bowel syndrome without diarrhea: Secondary | ICD-10-CM | POA: Diagnosis present

## 2023-06-04 DIAGNOSIS — D509 Iron deficiency anemia, unspecified: Secondary | ICD-10-CM | POA: Diagnosis present

## 2023-06-04 DIAGNOSIS — Z833 Family history of diabetes mellitus: Secondary | ICD-10-CM | POA: Diagnosis not present

## 2023-06-04 DIAGNOSIS — Z3A28 28 weeks gestation of pregnancy: Secondary | ICD-10-CM | POA: Insufficient documentation

## 2023-06-04 DIAGNOSIS — Z302 Encounter for sterilization: Secondary | ICD-10-CM

## 2023-06-04 DIAGNOSIS — R030 Elevated blood-pressure reading, without diagnosis of hypertension: Secondary | ICD-10-CM | POA: Diagnosis present

## 2023-06-04 DIAGNOSIS — D62 Acute posthemorrhagic anemia: Secondary | ICD-10-CM | POA: Diagnosis not present

## 2023-06-04 DIAGNOSIS — O9902 Anemia complicating childbirth: Secondary | ICD-10-CM | POA: Diagnosis present

## 2023-06-04 DIAGNOSIS — O1490 Unspecified pre-eclampsia, unspecified trimester: Principal | ICD-10-CM | POA: Diagnosis present

## 2023-06-04 DIAGNOSIS — O9962 Diseases of the digestive system complicating childbirth: Secondary | ICD-10-CM | POA: Diagnosis present

## 2023-06-04 DIAGNOSIS — Z01812 Encounter for preprocedural laboratory examination: Secondary | ICD-10-CM | POA: Insufficient documentation

## 2023-06-04 DIAGNOSIS — O34211 Maternal care for low transverse scar from previous cesarean delivery: Secondary | ICD-10-CM | POA: Diagnosis present

## 2023-06-04 DIAGNOSIS — K219 Gastro-esophageal reflux disease without esophagitis: Secondary | ICD-10-CM | POA: Diagnosis present

## 2023-06-04 DIAGNOSIS — Z2839 Other underimmunization status: Secondary | ICD-10-CM

## 2023-06-04 DIAGNOSIS — Z9049 Acquired absence of other specified parts of digestive tract: Secondary | ICD-10-CM

## 2023-06-04 DIAGNOSIS — O133 Gestational [pregnancy-induced] hypertension without significant proteinuria, third trimester: Secondary | ICD-10-CM | POA: Diagnosis present

## 2023-06-04 DIAGNOSIS — Z3009 Encounter for other general counseling and advice on contraception: Secondary | ICD-10-CM | POA: Diagnosis present

## 2023-06-04 DIAGNOSIS — O34219 Maternal care for unspecified type scar from previous cesarean delivery: Secondary | ICD-10-CM | POA: Diagnosis present

## 2023-06-04 DIAGNOSIS — Z8742 Personal history of other diseases of the female genital tract: Secondary | ICD-10-CM

## 2023-06-04 DIAGNOSIS — Z3480 Encounter for supervision of other normal pregnancy, unspecified trimester: Principal | ICD-10-CM

## 2023-06-04 DIAGNOSIS — O09899 Supervision of other high risk pregnancies, unspecified trimester: Secondary | ICD-10-CM

## 2023-06-04 DIAGNOSIS — D649 Anemia, unspecified: Secondary | ICD-10-CM

## 2023-06-04 LAB — TYPE AND SCREEN
ABO/RH(D): B POS
Antibody Screen: NEGATIVE
Extend sample reason: UNDETERMINED

## 2023-06-04 LAB — AST: AST: 15 U/L (ref 15–41)

## 2023-06-04 LAB — BASIC METABOLIC PANEL
Anion gap: 11 (ref 5–15)
BUN: 7 mg/dL (ref 6–20)
CO2: 22 mmol/L (ref 22–32)
Calcium: 9 mg/dL (ref 8.9–10.3)
Chloride: 103 mmol/L (ref 98–111)
Creatinine, Ser: 0.75 mg/dL (ref 0.44–1.00)
GFR, Estimated: >60 mL/min (ref >60)
Glucose, Bld: 79 mg/dL (ref 70–99)
Potassium: 2.7 mmol/L — CL (ref 3.5–5.1)
Sodium: 136 mmol/L (ref 135–145)

## 2023-06-04 LAB — COMPREHENSIVE METABOLIC PANEL
ALT: 11 U/L (ref 0–44)
AST: 16 U/L (ref 15–41)
Albumin: 3.2 g/dL — ABNORMAL LOW (ref 3.5–5.0)
Alkaline Phosphatase: 124 U/L (ref 38–126)
Anion gap: 11 (ref 5–15)
BUN: 6 mg/dL (ref 6–20)
CO2: 23 mmol/L (ref 22–32)
Calcium: 9 mg/dL (ref 8.9–10.3)
Chloride: 102 mmol/L (ref 98–111)
Creatinine, Ser: 0.65 mg/dL (ref 0.44–1.00)
GFR, Estimated: >60 mL/min (ref >60)
Glucose, Bld: 82 mg/dL (ref 70–99)
Potassium: 3.2 mmol/L — ABNORMAL LOW (ref 3.5–5.1)
Sodium: 136 mmol/L (ref 135–145)
Total Bilirubin: 0.3 mg/dL (ref <1.2)
Total Protein: 7.2 g/dL (ref 6.5–8.1)

## 2023-06-04 LAB — CBC
HCT: 33.4 % — ABNORMAL LOW (ref 36.0–46.0)
HCT: 31.2 % — ABNORMAL LOW (ref 36.0–46.0)
Hemoglobin: 10.9 g/dL — ABNORMAL LOW (ref 12.0–15.0)
Hemoglobin: 10.5 g/dL — ABNORMAL LOW (ref 12.0–15.0)
MCH: 27.6 pg (ref 26.0–34.0)
MCH: 27.8 pg (ref 26.0–34.0)
MCHC: 32.6 g/dL (ref 30.0–36.0)
MCHC: 33.7 g/dL (ref 30.0–36.0)
MCV: 84.6 fL (ref 80.0–100.0)
MCV: 82.5 fL (ref 80.0–100.0)
Platelets: 245 10*3/uL (ref 150–400)
Platelets: 273 10*3/uL (ref 150–400)
RBC: 3.95 MIL/uL (ref 3.87–5.11)
RBC: 3.78 MIL/uL — ABNORMAL LOW (ref 3.87–5.11)
RDW: 15.8 % — ABNORMAL HIGH (ref 11.5–15.5)
RDW: 15.8 % — ABNORMAL HIGH (ref 11.5–15.5)
WBC: 9.2 10*3/uL (ref 4.0–10.5)
WBC: 11 10*3/uL — ABNORMAL HIGH (ref 4.0–10.5)
nRBC: 0 % (ref 0.0–0.2)
nRBC: 0 % (ref 0.0–0.2)

## 2023-06-04 LAB — PROTEIN / CREATININE RATIO, URINE
Creatinine, Urine: 75 mg/dL
Protein Creatinine Ratio: 0.31 mg/mg{creat} — ABNORMAL HIGH (ref 0.00–0.15)
Total Protein, Urine: 23 mg/dL

## 2023-06-04 LAB — ALT: ALT: 12 U/L (ref 0–44)

## 2023-06-04 SURGERY — Surgical Case
Anesthesia: Spinal

## 2023-06-04 MED ORDER — LACTATED RINGERS IV SOLN: INTRAVENOUS | Status: DC

## 2023-06-04 MED ORDER — FENTANYL CITRATE (PF) 100 MCG/2ML IJ SOLN
INTRAMUSCULAR | Status: DC | PRN
  Administered 2023-06-04: 35 ug via INTRAVENOUS
  Administered 2023-06-04: 25 ug via INTRAVENOUS
  Administered 2023-06-04: 15 ug via INTRAVENOUS
  Administered 2023-06-04: 25 ug via INTRAVENOUS

## 2023-06-04 MED ORDER — DIPHENHYDRAMINE HCL 50 MG/ML IJ SOLN
12.5000 mg | INTRAMUSCULAR | Status: DC | PRN
Start: 2023-06-04 — End: 2023-06-06
  Administered 2023-06-05: 12.5 mg via INTRAVENOUS
  Filled 2023-06-04: qty 1

## 2023-06-04 MED ORDER — ACETAMINOPHEN 500 MG PO TABS
1000.0000 mg | ORAL_TABLET | Freq: Four times a day (QID) | ORAL | Status: DC
  Administered 2023-06-04 – 2023-06-07 (×11): 1000 mg via ORAL
  Filled 2023-06-04 (×10): qty 2

## 2023-06-04 MED ORDER — MAGNESIUM SULFATE BOLUS VIA INFUSION
4.0000 g | Freq: Once | INTRAVENOUS | Status: AC
  Administered 2023-06-04: 4 g via INTRAVENOUS
  Filled 2023-06-04: qty 1000

## 2023-06-04 MED ORDER — POVIDONE-IODINE 10 % EX SWAB: 2.0000 | Freq: Once | CUTANEOUS | Status: DC

## 2023-06-04 MED ORDER — POTASSIUM CHLORIDE CRYS ER 10 MEQ PO TBCR
40.0000 meq | EXTENDED_RELEASE_TABLET | Freq: Once | ORAL | Status: AC
  Administered 2023-06-04: 40 meq via ORAL
  Filled 2023-06-04: qty 4

## 2023-06-04 MED ORDER — DIPHENHYDRAMINE HCL 25 MG PO CAPS
25.0000 mg | ORAL_CAPSULE | Freq: Four times a day (QID) | ORAL | Status: DC | PRN
  Administered 2023-06-04: 25 mg via ORAL
  Filled 2023-06-04 (×2): qty 1

## 2023-06-04 MED ORDER — LIDOCAINE 5 % EX PTCH
1.0000 | MEDICATED_PATCH | CUTANEOUS | Status: DC
  Filled 2023-06-04: qty 1

## 2023-06-04 MED ORDER — GABAPENTIN 100 MG PO CAPS
100.0000 mg | ORAL_CAPSULE | Freq: Three times a day (TID) | ORAL | Status: DC
  Administered 2023-06-04 – 2023-06-07 (×8): 100 mg via ORAL
  Filled 2023-06-04 (×8): qty 1

## 2023-06-04 MED ORDER — COCONUT OIL OIL
1.0000 | TOPICAL_OIL | Status: DC | PRN
  Filled 2023-06-04: qty 7.5
  Filled 2023-06-04: qty 15

## 2023-06-04 MED ORDER — MAGNESIUM SULFATE 40 GM/1000ML IV SOLN
2.0000 g/h | INTRAVENOUS | Status: DC
  Administered 2023-06-04 – 2023-06-05 (×3): 2 g/h via INTRAVENOUS
  Filled 2023-06-04 (×2): qty 1000

## 2023-06-04 MED ORDER — OXYTOCIN-SODIUM CHLORIDE 30-0.9 UT/500ML-% IV SOLN
INTRAVENOUS | Status: AC
  Administered 2023-06-04: 333 mL via INTRAVENOUS
  Filled 2023-06-04: qty 500

## 2023-06-04 MED ORDER — CHLORHEXIDINE GLUCONATE 0.12 % MT SOLN
OROMUCOSAL | Status: AC
  Administered 2023-06-04: 15 mL
  Filled 2023-06-04: qty 15

## 2023-06-04 MED ORDER — LABETALOL HCL 5 MG/ML IV SOLN: 80.0000 mg | INTRAVENOUS | Status: DC | PRN

## 2023-06-04 MED ORDER — DIPHENHYDRAMINE HCL 25 MG PO CAPS
25.0000 mg | ORAL_CAPSULE | ORAL | Status: DC | PRN
  Administered 2023-06-05: 25 mg via ORAL

## 2023-06-04 MED ORDER — LABETALOL HCL 5 MG/ML IV SOLN
20.0000 mg | INTRAVENOUS | Status: DC | PRN
  Administered 2023-06-04: 20 mg via INTRAVENOUS
  Filled 2023-06-04: qty 4

## 2023-06-04 MED ORDER — ONDANSETRON HCL 4 MG/2ML IJ SOLN
INTRAMUSCULAR | Status: AC
  Filled 2023-06-04: qty 2

## 2023-06-04 MED ORDER — OXYCODONE HCL 5 MG PO TABS
5.0000 mg | ORAL_TABLET | Freq: Four times a day (QID) | ORAL | Status: DC | PRN
  Administered 2023-06-05 – 2023-06-06 (×6): 5 mg via ORAL
  Filled 2023-06-04 (×6): qty 1

## 2023-06-04 MED ORDER — NIFEDIPINE ER OSMOTIC RELEASE 30 MG PO TB24
30.0000 mg | ORAL_TABLET | Freq: Every day | ORAL | Status: DC
  Administered 2023-06-05 – 2023-06-07 (×3): 30 mg via ORAL
  Filled 2023-06-04 (×4): qty 1

## 2023-06-04 MED ORDER — KETOROLAC TROMETHAMINE 30 MG/ML IJ SOLN
INTRAMUSCULAR | Status: AC
  Filled 2023-06-04: qty 1

## 2023-06-04 MED ORDER — ACETAMINOPHEN 500 MG PO TABS
ORAL_TABLET | ORAL | Status: AC
  Filled 2023-06-04: qty 2

## 2023-06-04 MED ORDER — MORPHINE SULFATE (PF) 0.5 MG/ML IJ SOLN
INTRAMUSCULAR | Status: DC | PRN
  Administered 2023-06-04: .1 mg via EPIDURAL
  Administered 2023-06-04: 3 mg via EPIDURAL

## 2023-06-04 MED ORDER — ONDANSETRON HCL 4 MG/2ML IJ SOLN
INTRAMUSCULAR | Status: DC | PRN
  Administered 2023-06-04: 4 mg via INTRAVENOUS

## 2023-06-04 MED ORDER — KETOROLAC TROMETHAMINE 30 MG/ML IJ SOLN
30.0000 mg | Freq: Four times a day (QID) | INTRAMUSCULAR | Status: AC
  Administered 2023-06-04 – 2023-06-05 (×4): 30 mg via INTRAVENOUS
  Filled 2023-06-04 (×3): qty 1

## 2023-06-04 MED ORDER — HYDRALAZINE HCL 20 MG/ML IJ SOLN: 10.0000 mg | INTRAMUSCULAR | Status: DC | PRN

## 2023-06-04 MED ORDER — BUTALBITAL-APAP-CAFFEINE 50-325-40 MG PO TABS: 1.0000 | ORAL_TABLET | Freq: Once | ORAL | Status: DC

## 2023-06-04 MED ORDER — MENTHOL 3 MG MT LOZG: 1.0000 | LOZENGE | OROMUCOSAL | Status: DC | PRN

## 2023-06-04 MED ORDER — KETOROLAC TROMETHAMINE 30 MG/ML IJ SOLN: 30.0000 mg | Freq: Four times a day (QID) | INTRAMUSCULAR | Status: AC | PRN

## 2023-06-04 MED ORDER — ZOLPIDEM TARTRATE 5 MG PO TABS: 5.0000 mg | ORAL_TABLET | Freq: Every evening | ORAL | Status: DC | PRN

## 2023-06-04 MED ORDER — PRENATAL MULTIVITAMIN CH
1.0000 | ORAL_TABLET | Freq: Every day | ORAL | Status: DC
  Administered 2023-06-05 – 2023-06-07 (×3): 1 via ORAL
  Filled 2023-06-04 (×3): qty 1

## 2023-06-04 MED ORDER — KETOROLAC TROMETHAMINE 30 MG/ML IJ SOLN
30.0000 mg | Freq: Four times a day (QID) | INTRAMUSCULAR | Status: AC | PRN
  Administered 2023-06-04: 30 mg via INTRAVENOUS
  Filled 2023-06-04: qty 1

## 2023-06-04 MED ORDER — POTASSIUM CHLORIDE 10 MEQ/100ML IV SOLN
10.0000 meq | INTRAVENOUS | Status: DC
  Administered 2023-06-04: 10 meq via INTRAVENOUS
  Filled 2023-06-04: qty 100

## 2023-06-04 MED ORDER — CEFAZOLIN SODIUM-DEXTROSE 2-4 GM/100ML-% IV SOLN
INTRAVENOUS | Status: AC
  Filled 2023-06-04: qty 100

## 2023-06-04 MED ORDER — PHENYLEPHRINE HCL-NACL 20-0.9 MG/250ML-% IV SOLN
INTRAVENOUS | Status: DC | PRN
  Administered 2023-06-04: 32 ug/min via INTRAVENOUS

## 2023-06-04 MED ORDER — NALOXONE HCL 4 MG/10ML IJ SOLN: 1.0000 ug/kg/h | INTRAVENOUS | Status: DC | PRN

## 2023-06-04 MED ORDER — BUPIVACAINE IN DEXTROSE 0.75-8.25 % IT SOLN
INTRATHECAL | Status: DC | PRN
  Administered 2023-06-04: 1.6 mL via INTRATHECAL

## 2023-06-04 MED ORDER — LABETALOL HCL 5 MG/ML IV SOLN
40.0000 mg | INTRAVENOUS | Status: DC | PRN
  Administered 2023-06-04: 40 mg via INTRAVENOUS
  Filled 2023-06-04: qty 8

## 2023-06-04 MED ORDER — OXYTOCIN-SODIUM CHLORIDE 30-0.9 UT/500ML-% IV SOLN: 2.5000 [IU]/h | INTRAVENOUS | Status: AC

## 2023-06-04 MED ORDER — FENTANYL CITRATE (PF) 100 MCG/2ML IJ SOLN
INTRAMUSCULAR | Status: AC
  Filled 2023-06-04: qty 2

## 2023-06-04 MED ORDER — DIBUCAINE (PERIANAL) 1 % EX OINT: 1.0000 | TOPICAL_OINTMENT | CUTANEOUS | Status: DC | PRN

## 2023-06-04 MED ORDER — SODIUM CHLORIDE 0.9% FLUSH: 3.0000 mL | INTRAVENOUS | Status: DC | PRN

## 2023-06-04 MED ORDER — MORPHINE SULFATE (PF) 0.5 MG/ML IJ SOLN
INTRAMUSCULAR | Status: AC
  Filled 2023-06-04: qty 10

## 2023-06-04 MED ORDER — NALOXONE HCL 0.4 MG/ML IJ SOLN
0.4000 mg | INTRAMUSCULAR | Status: DC | PRN
Start: 2023-06-04 — End: 2023-06-06

## 2023-06-04 MED ORDER — SIMETHICONE 80 MG PO CHEW
80.0000 mg | CHEWABLE_TABLET | Freq: Three times a day (TID) | ORAL | Status: DC
  Administered 2023-06-05 – 2023-06-07 (×8): 80 mg via ORAL
  Filled 2023-06-04 (×8): qty 1

## 2023-06-04 MED ORDER — DEXMEDETOMIDINE HCL IN NACL 80 MCG/20ML IV SOLN
INTRAVENOUS | Status: DC | PRN
  Administered 2023-06-04: 8 ug via INTRAVENOUS

## 2023-06-04 MED ORDER — CEFAZOLIN SODIUM-DEXTROSE 2-4 GM/100ML-% IV SOLN
2.0000 g | INTRAVENOUS | Status: AC
  Administered 2023-06-04: 2 g via INTRAVENOUS

## 2023-06-04 MED ORDER — DEXMEDETOMIDINE HCL IN NACL 80 MCG/20ML IV SOLN
INTRAVENOUS | Status: AC
  Filled 2023-06-04: qty 20

## 2023-06-04 MED ORDER — LACTATED RINGERS IV SOLN: Freq: Once | INTRAVENOUS | Status: AC

## 2023-06-04 MED ORDER — SENNOSIDES-DOCUSATE SODIUM 8.6-50 MG PO TABS
2.0000 | ORAL_TABLET | Freq: Every day | ORAL | Status: DC
  Administered 2023-06-05 – 2023-06-07 (×3): 2 via ORAL
  Filled 2023-06-04 (×3): qty 2

## 2023-06-04 MED ORDER — KETOROLAC TROMETHAMINE 30 MG/ML IJ SOLN
INTRAMUSCULAR | Status: DC | PRN
  Administered 2023-06-04: 30 mg via INTRAVENOUS

## 2023-06-04 MED ORDER — OXYTOCIN BOLUS FROM INFUSION: 333.0000 mL | Freq: Once | INTRAVENOUS | Status: AC

## 2023-06-04 MED ORDER — POTASSIUM CHLORIDE CRYS ER 10 MEQ PO TBCR
40.0000 meq | EXTENDED_RELEASE_TABLET | Freq: Once | ORAL | Status: AC
  Administered 2023-06-05: 40 meq via ORAL
  Filled 2023-06-04: qty 4

## 2023-06-04 MED ORDER — SIMETHICONE 80 MG PO CHEW
80.0000 mg | CHEWABLE_TABLET | ORAL | Status: DC | PRN
  Administered 2023-06-05 – 2023-06-06 (×2): 80 mg via ORAL
  Filled 2023-06-04 (×2): qty 1

## 2023-06-04 MED ORDER — POTASSIUM CHLORIDE 10 MEQ/100ML IV SOLN
10.0000 meq | INTRAVENOUS | Status: DC
Start: 2023-06-04 — End: 2023-06-05
  Administered 2023-06-04 – 2023-06-05 (×2): 10 meq via INTRAVENOUS
  Filled 2023-06-04 (×2): qty 100

## 2023-06-04 MED ORDER — LACTATED RINGERS IV SOLN: 500.0000 mL | INTRAVENOUS | Status: DC | PRN

## 2023-06-04 MED ORDER — OXYTOCIN-SODIUM CHLORIDE 30-0.9 UT/500ML-% IV SOLN
2.5000 [IU]/h | INTRAVENOUS | Status: DC
  Administered 2023-06-04: 600 m[IU]/min via INTRAVENOUS
  Filled 2023-06-04: qty 500

## 2023-06-04 MED ORDER — IBUPROFEN 600 MG PO TABS
600.0000 mg | ORAL_TABLET | Freq: Four times a day (QID) | ORAL | Status: DC
  Administered 2023-06-05 – 2023-06-07 (×7): 600 mg via ORAL
  Filled 2023-06-04 (×7): qty 1

## 2023-06-04 MED ORDER — SOD CITRATE-CITRIC ACID 500-334 MG/5ML PO SOLN
ORAL | Status: AC
  Administered 2023-06-04: 30 mL
  Filled 2023-06-04: qty 15

## 2023-06-04 MED ORDER — WITCH HAZEL-GLYCERIN EX PADS: 1.0000 | MEDICATED_PAD | CUTANEOUS | Status: DC | PRN

## 2023-06-04 MED ORDER — PHENYLEPHRINE HCL-NACL 20-0.9 MG/250ML-% IV SOLN
INTRAVENOUS | Status: AC
  Filled 2023-06-04: qty 250

## 2023-06-04 MED ORDER — ACETAMINOPHEN 500 MG PO TABS
1000.0000 mg | ORAL_TABLET | Freq: Four times a day (QID) | ORAL | Status: AC
  Filled 2023-06-04: qty 2

## 2023-06-04 MED ORDER — SCOPOLAMINE 1 MG/3DAYS TD PT72: 1.0000 | MEDICATED_PATCH | Freq: Once | TRANSDERMAL | Status: DC

## 2023-06-04 MED ORDER — ONDANSETRON HCL 4 MG/2ML IJ SOLN
4.0000 mg | Freq: Four times a day (QID) | INTRAMUSCULAR | Status: DC | PRN
  Administered 2023-06-04: 4 mg via INTRAVENOUS

## 2023-06-04 MED ORDER — BUPIVACAINE HCL (PF) 0.5 % IJ SOLN
INTRAMUSCULAR | Status: DC | PRN
  Administered 2023-06-04: 60 mL

## 2023-06-04 MED ORDER — ACETAMINOPHEN 500 MG PO TABS
1000.0000 mg | ORAL_TABLET | Freq: Four times a day (QID) | ORAL | Status: DC | PRN
  Administered 2023-06-04: 1000 mg via ORAL

## 2023-06-04 MED ORDER — BUPIVACAINE HCL (PF) 0.5 % IJ SOLN
INTRAMUSCULAR | Status: AC
  Filled 2023-06-04: qty 60

## 2023-06-04 SURGICAL SUPPLY — 27 items
BAG COUNTER SPONGE SURGICOUNT (BAG) ×1 IMPLANT
BENZOIN TINCTURE PRP APPL 2/3 (GAUZE/BANDAGES/DRESSINGS) IMPLANT
CHLORAPREP W/TINT 26 (MISCELLANEOUS) ×2 IMPLANT
DRSG TELFA 3X8 NADH STRL (GAUZE/BANDAGES/DRESSINGS) ×1 IMPLANT
ELECT REM PT RETURN 9FT ADLT (ELECTROSURGICAL) ×1
ELECTRODE REM PT RTRN 9FT ADLT (ELECTROSURGICAL) ×1 IMPLANT
GAUZE SPONGE 4X4 12PLY STRL (GAUZE/BANDAGES/DRESSINGS) ×1 IMPLANT
GLOVE BIO SURGEON STRL SZ 6.5 (GLOVE) ×1 IMPLANT
GLOVE INDICATOR 7.0 STRL GRN (GLOVE) ×1 IMPLANT
GOWN STRL REUS W/ TWL LRG LVL3 (GOWN DISPOSABLE) ×2 IMPLANT
GOWN STRL REUS W/TWL LRG LVL3 (GOWN DISPOSABLE) ×2
KIT TURNOVER KIT A (KITS) ×1 IMPLANT
MANIFOLD NEPTUNE II (INSTRUMENTS) ×1 IMPLANT
MAT PREVALON FULL STRYKER (MISCELLANEOUS) ×1 IMPLANT
NS IRRIG 1000ML POUR BTL (IV SOLUTION) ×1 IMPLANT
PACK C SECTION AR (MISCELLANEOUS) ×1 IMPLANT
PAD ABD DERMACEA PRESS 5X9 (GAUZE/BANDAGES/DRESSINGS) IMPLANT
PAD OB MATERNITY 4.3X12.25 (PERSONAL CARE ITEMS) ×1 IMPLANT
PAD PREP OB/GYN DISP 24X41 (PERSONAL CARE ITEMS) ×1 IMPLANT
SCRUB CHG 4% DYNA-HEX 4OZ (MISCELLANEOUS) ×1 IMPLANT
STRIP CLOSURE SKIN 1/2X4 (GAUZE/BANDAGES/DRESSINGS) IMPLANT
SUT MNCRL AB 4-0 PS2 18 (SUTURE) ×1 IMPLANT
SUT VIC AB 0 CT1 36 (SUTURE) ×4 IMPLANT
SUT VIC AB 3-0 SH 27 (SUTURE) ×1
SUT VIC AB 3-0 SH 27X BRD (SUTURE) ×1 IMPLANT
TRAP FLUID SMOKE EVACUATOR (MISCELLANEOUS) ×1 IMPLANT
WATER STERILE IRR 500ML POUR (IV SOLUTION) ×1 IMPLANT

## 2023-06-04 NOTE — OB Triage Note (Signed)
Pt reports to L&D with complaints of persistent headache that started last night around 8pm 6-7/10. She took 2 tylenol last night that didn't help much. She took 2 tylenol again at 6am this morning and it helped for 1-2 hours. Pt denies vision changes. Pt has 2+ pitting edema and 1 beat of clonus bilaterally. Denies vaginal bleeding and LOF. States positive fetal movement. EFM and toco applied and assessing.

## 2023-06-04 NOTE — Transfer of Care (Signed)
Immediate Anesthesia Transfer of Care Note  Patient: Tiffany Guzman  Procedure(s) Performed: REPEAT CESAREAN DELIVERY WITH TUBAL  Patient Location: PACU and Mother/Baby  Anesthesia Type:Spinal  Level of Consciousness: awake, alert , and oriented  Airway & Oxygen Therapy: Patient Spontanous Breathing  Post-op Assessment: Report given to RN and Post -op Vital signs reviewed and stable  Post vital signs: Reviewed and stable  Last Vitals:  Vitals Value Taken Time  BP 121/88 06/04/23 1955  Temp    Pulse 91 06/04/23 1955  Resp 19 06/04/23 1955  SpO2 91 % 06/04/23 1955  Vitals shown include unfiled device data.  Last Pain:  Vitals:   06/04/23 1430  TempSrc: Oral  PainSc:       Patients Stated Pain Goal: 0 (06/04/23 1117)  Complications: No notable events documented.

## 2023-06-04 NOTE — Anesthesia Preprocedure Evaluation (Addendum)
Anesthesia Evaluation  Patient identified by MRN, date of birth, ID band Patient awake    Reviewed: Allergy & Precautions, H&P , NPO status , Patient's Chart, lab work & pertinent test results  Airway Mallampati: II  TM Distance: >3 FB Neck ROM: full    Dental no notable dental hx.    Pulmonary neg pulmonary ROS, neg shortness of breath   Pulmonary exam normal        Cardiovascular Exercise Tolerance: Good hypertension, Normal cardiovascular exam     Neuro/Psych  Headaches PSYCHIATRIC DISORDERS Anxiety Depression       GI/Hepatic ,GERD  ,,  Endo/Other    Renal/GU   negative genitourinary   Musculoskeletal   Abdominal Normal abdominal exam  (+)   Peds  Hematology  (+) Blood dyscrasia, anemia   Anesthesia Other Findings 27 y.o. G2P1001 at [redacted]w[redacted]d with EDD: 06/12/2023, by 6 week Ultrasound admitted for repeat cesarean delivery due to new diagnosis of preeclampsia.   Preeclampsia, desire for Cesarean Delivery - Meets criteria for diagnosis of preeclampsia with UP:C of 0.310 and ongoing elevated blood pressures.  - Desires repeat cesarean delivery which she previously had scheduled on 11/5. Given worsening symptoms, will proceed with delivery today once patient is NPO for 8 hours. She last ate this morning around 0930. Anesthesia aware and ok with proceeding after 1730. - On admission, one severe range pressure requiring treatment with IV labetalol. Will continue to monitor blood pressures and if she has additional severe range blood pressures, will start magnesium protocol for seizure prophylaxis.  - Headache that was present earlier today has improved. Still feels some mild pain just behind eyes. Has pain medication available PRN but declines at this time.  - Patient also desires BTL which will be completed at time of LTCS.   Hypokalemia - Potassium of 2.7 on admission. Will initiate potassium replenishment with 40 mEq oral  K-dur and 10 mEq potassium chloride IV x 3 doses.   Past Medical History: No date: Acne No date: Anemia No date: Anxiety No date: Attention deficit hyperactivity disorder (ADHD) No date: Bradycardia No date: Cannabis abuse No date: Chronic upper back pain     Comment:  lower and upper back pain No date: Depression No date: Dyspnea No date: Family history of adverse reaction to anesthesia     Comment:  paternal grandmother-n/v No date: GERD (gastroesophageal reflux disease) 11/14/2017: Gestational hypertension No date: Hx of ovarian cyst     Comment:  dermoid No date: IBS (irritable bowel syndrome) No date: Leukocytosis No date: Migraine with aura     Comment:  since elementary school  Past Surgical History: 11/14/2017: CESAREAN SECTION; N/A     Comment:  Procedure: CESAREAN SECTION;  Surgeon: [name], MD;  Location: [facility] ORS;  Service: Obstetrics;                Laterality: N/A; No date: CHOLECYSTECTOMY 02/17/2019: ESOPHAGOGASTRODUODENOSCOPY; Left     Comment:  Procedure: ESOPHAGOGASTRODUODENOSCOPY (EGD);  Surgeon:               Pasty Spillers, MD;  Location: [facility] ENDOSCOPY;                Service: Endoscopy;  Laterality: Left; No date: INSERTION OF NON VAGINAL CONTRACEPTIVE DEVICE 02/23/2019: LAPAROSCOPIC OVARIAN CYSTECTOMY; Right     Comment:  Procedure: LAPAROSCOPIC OVARIAN CYSTECTOMY;  Surgeon:  Natale Milch, MD;  Location: [facility] ORS;  Service:              Gynecology;  Laterality: Right; 02/25/2021: LAPAROSCOPY; N/A     Comment:  Procedure: LAPAROSCOPY DIAGNOSTIC;  Surgeon: [name], MD;  Location: [facility] ORS;  Service: Gynecology;              Laterality: N/A; 10/03/2017: OOPHORECTOMY; Left     Comment:  Procedure: OOPHORECTOMY;  Surgeon: Natale Milch, MD;  Location: [facility] ORS;  Service: Gynecology;                Laterality: Left; 10/03/2017: OVARIAN CYST REMOVAL;  Left     Comment:  Procedure: OVARIAN CYSTECTOMY;  Surgeon: Natale Milch, MD;  Location: [facility] ORS;  Service:               Gynecology;  Laterality: Left; 02/25/2021: OVARIAN CYST REMOVAL; Right     Comment:  Procedure: OVARIAN CYSTECTOMY;  Surgeon: [name], MD;  Location: [facility] ORS;  Service: Gynecology;              Laterality: Right; 09/05/2022: WISDOM TOOTH EXTRACTION     Comment:  four  BMI    Body Mass Index: 32.10 kg/m      Reproductive/Obstetrics (+) Pregnancy                             Anesthesia Physical Anesthesia Plan  ASA: 3  Anesthesia Plan: Spinal   Post-op Pain Management: Minimal or no pain anticipated   Induction: Intravenous  PONV Risk Score and Plan: Ondansetron  Airway Management Planned: Natural Airway  Additional Equipment:   Intra-op Plan:   Post-operative Plan:   Informed Consent: I have reviewed the patients History and Physical, chart, labs and discussed the procedure including the risks, benefits and alternatives for the proposed anesthesia with the patient or authorized representative who has indicated his/her understanding and acceptance.     Dental Advisory Given  Plan Discussed with: Anesthesiologist and CRNA  Anesthesia Plan Comments:         Anesthesia Quick Evaluation

## 2023-06-04 NOTE — Progress Notes (Addendum)
  [facility] Perioperative Services: Pre-Admission/Anesthesia Testing  Abnormal Lab Notification and Treatment Plan of Care   Date: 06/04/23  Name: Tiffany Guzman MRN:   [id number]  Re: Abnormal labs noted during PAT appointment   Notified:  Provider Name Provider Role Notification Mode  Smiley Houseman, MD OB/GYN (Surgeon) Routed and/or faxed via Dorice Lamas, Sarah, CNM OB/GYN (APP) [facility] secure messaging   Clinical Information and Notes:  ABNORMAL LAB VALUE(S): Lab Results  Component Value Date   K 2.7 (LL) 06/04/2023   Tnya Lon Guzman is scheduled for an elective REPEAT CESAREAN DELIVERY WITH TUBAL on 06/06/2023. In review of her medication reconciliation, it is noted that the patient is not taking any prescribed diuretic medications.  It appears that after patient's lab appointment in PAT today, she checked in with L&D here at [facility] for further evaluation of a headache that started last night. Patient attempted self treatment with APAP both last night and again this morning with minimal relief. No vaginal bleeding or decreased fetal movement reported. RN assessment noted 2+ pitting edema in the patient's lower extremities. At the time of this note dictation, protein/creatine ratio has been collected and is in process.   Given that patient is in house, I communicated with the OB/GYN provider that will be seeing patient on the L&D floor today (Free, CNM). Will also communicate result to attending OB/GYN to make him aware. Electrolyte derangement to be managed by OB/GYN while here, rather than by anesthesia as an outpatient.   No further needs from PAT department identified at this time.   Quentin Mulling, MSN, APRN, FNP-C, CEN [facility]  Perioperative Services Nurse Practitioner Phone: [phone] 06/04/23 12:22 PM  NOTE: This note has been prepared using Dragon dictation software. Despite my best  ability to proofread, there is always the potential that unintentional transcriptional errors may still occur from this process.

## 2023-06-04 NOTE — Progress Notes (Signed)
    OB/GYN Attending Progress Note    Patient is a 27 y.o. G69P1001 female at [redacted]w[redacted]d who presented with complaints of new onset headache that has not resolved with treatment in the setting of gestational HTN. Patient has undergone workup to reveal diagnosis of pre-eclampsia this admission. Was scheduled to undergo repeat C/S on Friday, however will now plan for delivery due to diagnosis.  Currently on magnesium sulfate due to several severe range BPs that have not been responsive to medications (PO Procardia and IV labetalol).   The risks of surgery were discussed with the patient including but were not limited to: bleeding which may require transfusion or reoperation; infection which may require antibiotics; injury to bowel, bladder, ureters or other surrounding organs; injury to the fetus; need for additional procedures including hysterectomy in the event of a life-threatening hemorrhage; formation of adhesions; placental abnormalities with subsequent pregnancies; incisional problems; thromboembolic phenomenon and other postoperative/anesthesia complications.  The patient concurred with the proposed plan, giving informed written consent for the procedure.   Patient has been NPO since 1000 this morning she will remain NPO for procedure. Anesthesia and OR aware. Preoperative prophylactic antibiotics and SCDs ordered on call to the OR.  To OR when ready.     Hildred Laser, MD [facility]

## 2023-06-04 NOTE — Significant Event (Signed)
Patient continues to have severe range blood pressures requiring treatment with IV labetalol. Most recent pressure 161/100, treated with 40 mg of IV labetalol. Now meets criteria for preeclampsia with severe features, will initiate magnesium sulfate for seizure prophylaxis with 4g bolus and 2g/hr maintenance. Dr. Valentino Saxon notified of current patient status and initiation of magnesium sulfate.

## 2023-06-04 NOTE — Op Note (Signed)
Cesarean Section Procedure Note  Indications:  prior C-section x 2, desiring repeat;  gestational HTN with superimposed pre-eclampsia with severe features  Pre-operative Diagnosis: 38 week 6 day pregnancy, history of prior C-section x 2 desiring repeat, gestational HTN with superimposed pre-eclampsia with severe features, undesired fertility  Post-operative Diagnosis: Same  Surgeon: Hildred Laser, MD  Assistants:  Noelle Penner, CNM;   Procedure: Repeat low transverse Cesarean Section with Bilateral Tubal Ligation  Anesthesia: Spinal anesthesia  Findings: Female infant, cephalic presentation, 3410 grams, with Apgar scores of 8 at one minute and 9 at five minutes. Intact placenta with 3 vessel cord.  Clear amniotic fluid at amniotomy The uterine outline, tubes and right ovary appeared normal. Left ovary surgically absent.   Procedure Details: The patient was seen in the Holding Room. The risks, benefits, complications, treatment options, and expected outcomes were discussed with the patient.  The patient concurred with the proposed plan, giving informed consent.  The site of surgery properly noted/marked. The patient was taken to the Operating Room, identified as Tiffany Guzman and the procedure verified as C-Section Delivery. A Time Out was held and the above information confirmed.  After induction of anesthesia, the patient was draped and prepped in the usual sterile manner. Anesthesia was tested and noted to be adequate. A Pfannenstiel incision was made and carried down through the subcutaneous tissue to the fascia. Fascial incision was made and extended transversely. The fascia was separated from the underlying rectus tissue superiorly and inferiorly. The peritoneum was identified and entered. Peritoneal incision was extended longitudinally. The surgical assist was able to provide retraction to allow for clear visualization of surgical site. An Alexis retractor was placed in the abdomen for  additional retraction. The utero-vesical peritoneal reflection was incised transversely and the bladder flap was bluntly freed from the lower uterine segment. A low transverse uterine incision was made. Delivered from cephalic presentation was a 3410 gram Female with Apgar scores of 8 at one minute and 9 at five minutes.  The assistant was able to apply adequate fundal pressure to allow for successful delivery of the fetus. After the umbilical cord was clamped and cut, cord blood was obtained for evaluation. Delayed cord clamping was observed. The placenta was removed intact and appeared normal. The uterus was exteriorized and cleared of all clots and debris. The uterine outline, tubes and ovaries appeared normal.  The uterine incision was closed with running locked sutures of 0-Vicryl.  A second suture of 0-Vicryl was used in an imbricating layer. A figure-of-eight suture was placed in the midline of the incision for small amount of bleeding to achieve hemostasis. Hemostasis was observed.   Attention was then turned to the fallopian tubes, and where the patient's right fallopian tube was identified and grasped with a Babcock clamp.  The tube was then followed out to the fimbriated end.  The Babcock clamp was then used to grasp the tube approximately 4 cm from the cornual region.  A 3 cm segment of tube was then ligated with a free tie of 0-Chromic using the [place] method and excised.  The left fallopian tube was then ligated in a similar fashion and excised. The tubal lumens were cauterized bilaterally.  Good hemostasis was noted with bilateral fallopian tubes. The uterus was then returned to the abdomen.   The pericolic gutters were cleared of all clots and debris. The fascia was then grasped with Kocher clamps, and injected with 30 ml of solution of 60 ml of 0.5% Bupivacaine  diluted with 20 ml of normal saline. The fascia was then reapproximated with a running suture of 0-Vicryl. The subcutaneous fat layer  was reapproximated with 2-0 Vicryl. The previous scar was then grasped with serial Allice clamps and excised using the knife. The small bleeders were then cauterized using the bovie to achieve hemostasis.  The skin was reapproximated with 4-0 Monocryl. The skin and subcutaneous tissues were then injected with an additional 30 ml of the Exparel solution. The incision was covered with steri-strips and a pressure dressing.   Instrument, sponge, and needle counts were correct prior the abdominal closure and at the conclusion of the case.    An experienced assistant was required given the standard of surgical care given the complexity of the case.  This assistant was needed for exposure, dissection, suctioning, retraction, instrument exchange, and for overall help during the procedure.  Estimated Blood Loss:  450 ml      Drains: foley catheter to gravity drainage, 400 ml of clear urine at end of the procedure         Total IV Fluids:  900 ml  Specimens: None         Implants: None         Complications:  None; patient tolerated the procedure well.         Disposition: PACU - hemodynamically stable.         Condition: stable   Hildred Laser, MD [place] [facility] at [facility]
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
## 2023-06-04 NOTE — Anesthesia Procedure Notes (Signed)
Spinal  Patient location during procedure: OR Start time: 06/04/2023 6:14 PM End time: 06/04/2023 6:19 PM Reason for block: surgical anesthesia Staffing Performed: resident/CRNA  Anesthesiologist: Foye Deer, MD Resident/CRNA: Omer Jack, CRNA Performed by: Omer Jack, CRNA Authorized by: Foye Deer, MD   Preanesthetic Checklist Completed: patient identified, IV checked, site marked, risks and benefits discussed, surgical consent, monitors and equipment checked, pre-op evaluation and timeout performed Spinal Block Patient position: sitting Prep: Betadine Patient monitoring: heart rate, continuous pulse ox, blood pressure and cardiac monitor Approach: midline Location: L4-5 Injection technique: single-shot Needle Needle type: Whitacre and Introducer  Needle gauge: 24 G Needle length: 9 cm Assessment Sensory level: T4 Events: CSF return Additional Notes Negative paresthesia. Negative blood return. Positive free-flowing CSF. Expiration date of kit checked and confirmed. Patient tolerated procedure well, without complications.

## 2023-06-05 ENCOUNTER — Encounter: Payer: Self-pay | Admitting: Obstetrics and Gynecology

## 2023-06-05 LAB — CBC
HCT: 23.3 % — ABNORMAL LOW (ref 36.0–46.0)
Hemoglobin: 7.9 g/dL — ABNORMAL LOW (ref 12.0–15.0)
MCH: 27.9 pg (ref 26.0–34.0)
MCHC: 33.9 g/dL (ref 30.0–36.0)
MCV: 82.3 fL (ref 80.0–100.0)
Platelets: 192 10*3/uL (ref 150–400)
RBC: 2.83 MIL/uL — ABNORMAL LOW (ref 3.87–5.11)
RDW: 15.9 % — ABNORMAL HIGH (ref 11.5–15.5)
WBC: 11.1 10*3/uL — ABNORMAL HIGH (ref 4.0–10.5)
nRBC: 0 % (ref 0.0–0.2)

## 2023-06-05 LAB — RPR: RPR Ser Ql: NONREACTIVE

## 2023-06-05 LAB — COMPREHENSIVE METABOLIC PANEL
ALT: 11 U/L (ref 0–44)
AST: 21 U/L (ref 15–41)
Albumin: 2.5 g/dL — ABNORMAL LOW (ref 3.5–5.0)
Alkaline Phosphatase: 110 U/L (ref 38–126)
Anion gap: 6 (ref 5–15)
BUN: <5 mg/dL — ABNORMAL LOW (ref 6–20)
CO2: 24 mmol/L (ref 22–32)
Calcium: 6.7 mg/dL — ABNORMAL LOW (ref 8.9–10.3)
Chloride: 102 mmol/L (ref 98–111)
Creatinine, Ser: 0.74 mg/dL (ref 0.44–1.00)
GFR, Estimated: >60 mL/min (ref >60)
Glucose, Bld: 87 mg/dL (ref 70–99)
Potassium: 2.7 mmol/L — CL (ref 3.5–5.1)
Sodium: 132 mmol/L — ABNORMAL LOW (ref 135–145)
Total Bilirubin: 0.3 mg/dL (ref <1.2)
Total Protein: 5.5 g/dL — ABNORMAL LOW (ref 6.5–8.1)

## 2023-06-05 LAB — POTASSIUM: Potassium: 2.8 mmol/L — ABNORMAL LOW (ref 3.5–5.1)

## 2023-06-05 LAB — MAGNESIUM: Magnesium: 5.6 mg/dL — ABNORMAL HIGH (ref 1.7–2.4)

## 2023-06-05 MED ORDER — LACTATED RINGERS IV SOLN: INTRAVENOUS | Status: DC

## 2023-06-05 MED ORDER — POTASSIUM CHLORIDE 20 MEQ PO PACK
20.0000 meq | PACK | ORAL | Status: AC
  Administered 2023-06-05 (×3): 20 meq via ORAL
  Filled 2023-06-05 (×3): qty 1

## 2023-06-05 MED ORDER — MEASLES, MUMPS & RUBELLA VAC IJ SOLR
0.5000 mL | Freq: Once | INTRAMUSCULAR | Status: DC
  Filled 2023-06-05: qty 0.5

## 2023-06-05 NOTE — Anesthesia Postprocedure Evaluation (Signed)
Anesthesia Post Note  Patient: Tiffany Guzman  Procedure(s) Performed: REPEAT CESAREAN DELIVERY WITH TUBAL  Patient location during evaluation: L&D Anesthesia Type: Spinal Level of consciousness: oriented and awake and alert Pain management: pain level controlled Vital Signs Assessment: post-procedure vital signs reviewed and stable Respiratory status: spontaneous breathing and respiratory function stable Cardiovascular status: blood pressure returned to baseline Postop Assessment: no headache, no backache, no apparent nausea or vomiting, adequate PO intake and able to ambulate Anesthetic complications: no   No notable events documented.   Last Vitals:  Vitals:   06/05/23 0555 06/05/23 0638  BP:  139/78  Pulse:  80  Resp:    Temp:    SpO2: 94% 98%    Last Pain:  Vitals:   06/05/23 0553  TempSrc: Oral  PainSc:                  Katherine Basset

## 2023-06-05 NOTE — Lactation Note (Signed)
This note was copied from a baby's chart. Lactation Consultation Note  Patient Name: Tiffany Guzman [id number] Date: 06/05/2023 Age:27 hours Reason for consult: L&D Initial assessment;Early term 37-38.6wks   Maternal Data Lactation to room for an initial assessment w/ a P2 patient and a 15hr old baby Tiffany.  This was c-section delivery. Patient w/ hx of pre-eclampsia.  Feeding goal per mom is to offer colostrum/formula in the hospital and do formula once home.  Patient stated that she wanted to have the experience of providing colostrum to infant but has no desire to pump or anything once she is home. Patient would like some breastfeeding assistance at next feeding.  Infant has been receiving large volumes of formula so far, 15, 20 and 25ml.   Feeding Mother's Current Feeding Choice: Breast Milk and Formula Nipple Type: Slow - flow  Interventions Interventions: Breast feeding basics reviewed;Education  LC provided education on milk production expectations.  LC informed mom that breastfeeding is not one of those things you can just go cold [place], therefore, once she goes home and her body is producing milk she will need to make sure she is doing a slow wean of milk.  Education was provided to mom on using ice packets for engorgement, wearing a supportive bra and using cabbage leaves when she is ready to stop.  If patient is experiencing any pain with engorgement, we do not recommend pumping if she doesn't want to make milk but patient will need to hand express some of the milk off the breast and this will tell the body to slow down on the milk production.  Patient should continue to wear supportive bra and cabbage leaves until milk is reduced.  Patient verbalized understanding.   Mastitis education was provided.    Consult Status Consult Status: Follow-up from L&D Follow-up type: In-patient    Tiffany Guzman 06/05/2023, 12:33 PM

## 2023-06-05 NOTE — Progress Notes (Signed)
Patient ID: Tiffany Guzman, female   DOB: September 20, 1995, 27 y.o.   MRN: [id number]  Pt's potassium 2.7 and then 2.8 on repeat. Magnesium level 5.5. Per pharmacy recommend ECG and Potassium Chloride PO every 2 hours x3.   EKG NST with prolonged QT and possible left atrial enlargement.  Dr Valentino Saxon notified Will check CBC and CMP in the morning  Carie Caddy, CNM   [facility]  06/05/2023 3:12 PM

## 2023-06-05 NOTE — Discharge Summary (Signed)
Postpartum Discharge Summary  Date of Service updated: 11/16/024     Patient Name: Tiffany Guzman DOB: August 09, 1995 MRN: [id number]  Date of admission: 06/04/2023 Delivery date:06/04/2023 Delivering provider: Hildred Laser Date of discharge: 06/07/2023  Admitting diagnosis: Elevated blood pressure reading [R03.0] Preeclampsia [O14.90] Intrauterine pregnancy: [redacted]w[redacted]d     Secondary diagnosis:  Principal Problem:   Preeclampsia Active Problems:   History of cesarean delivery affecting pregnancy   Hypokalemia   Supervision of other normal pregnancy, antepartum   Rubella non-immune status, antepartum   Unwanted fertility   Gestational hypertension, third trimester  Additional problems: History of dermoid cyst, s/p left oophorectomy, Hypokalemia, and  Iron deficiency Anemia postpartum    Discharge diagnosis: Term Pregnancy Delivered, Preeclampsia (severe), and Hypokalemia                                               Post partum procedures:postpartum tubal ligation Augmentation: N/A Complications: None  Hospital course: Unscheduled C/S   27 y.o. yo G2P2002 at [redacted]w[redacted]d.  Patient was admitted to he hospital due to gestational HTN, with new onset of superimposed pre-eclampsia with severe features. Also had significant hypokalemia. On 06/04/2023 she underwent  unscheduled cesarean section with the following indication:Elective Repeat.Delivery details are as follows:  Membrane Rupture Time/Date: 6:38 PM,06/04/2023  Delivery Method:C-Section, Low Transverse Operative Delivery:N/A Details of operation can be found in separate operative note.  Patient had a postpartum course that was uncomplicated. She received IV and PO potassium supplementation.  She is ambulating, tolerating a regular diet, passing flatus, and urinating well. Patient is discharged home in stable condition on  06/07/23        Newborn Data: Birth date:06/04/2023 Birth time:6:45 PM Gender:Female Living  status:Living Apgars:8 ,9  Weight:3410 g    Magnesium Sulfate received: Yes: Seizure prophylaxis BMZ received: No Rhophylac:No MMR:Yes, ordered T-DaP: declined Flu: No RSV Vaccine received: No Transfusion:No Immunizations administered: Immunization History  Administered Date(s) Administered   Influenza-Unspecified 05/14/2017, 07/30/2018   Tdap 09/23/2017    Physical exam  Vitals:   06/07/23 0052 06/07/23 0415 06/07/23 0755 06/07/23 1142  BP: (!) 152/92 (!) 141/91 127/83 133/85  Pulse: 96 (!) 101 90 89  Resp: 16 18 20 18   Temp: 98 F (36.7 C) 98.6 F (37 C) 98.6 F (37 C) 98.6 F (37 C)  TempSrc:  Oral Oral Oral  SpO2: 96% 98% 95%   Weight:      Height:       General: alert and no distress Lochia: appropriate Uterine Fundus: firm Incision: Healing well with no significant drainage, No significant erythema, Dressing is clean, dry, and intact DVT Evaluation: Negative Homan's sign. Labs:     Latest Ref Rng & Units 06/07/2023    4:07 AM 06/06/2023    5:01 AM 06/05/2023    4:16 AM  CBC  WBC 4.0 - 10.5 K/uL 10.3  8.9  11.1   Hemoglobin 12.0 - 15.0 g/dL 8.3  7.6  7.9   Hematocrit 36.0 - 46.0 % 26.1  22.8  23.3   Platelets 150 - 400 K/uL 243  213  192        Latest Ref Rng & Units 06/07/2023    4:09 AM 06/06/2023    5:01 AM 06/05/2023    2:08 PM  CMP  Glucose 70 - 99 mg/dL 77  89  BUN 6 - 20 mg/dL 9  9    Creatinine 1.61 - 1.00 mg/dL 0.96  0.45    Sodium 409 - 145 mmol/L 138  135    Potassium 3.5 - 5.1 mmol/L 3.4  3.2  2.8   Chloride 98 - 111 mmol/L 106  105    CO2 22 - 32 mmol/L 22  21    Calcium 8.9 - 10.3 mg/dL 8.1  7.1    Total Protein 6.5 - 8.1 g/dL 5.9  5.4    Total Bilirubin <1.2 mg/dL 0.6  0.2    Alkaline Phos 38 - 126 U/L 141  108    AST 15 - 41 U/L 32  21    ALT 0 - 44 U/L 20  12      , Edinburgh Score:    06/05/2023    8:42 PM  Edinburgh Postnatal Depression Scale Screening Tool  I have been able to laugh and see the funny side of  things. 0  I have looked forward with enjoyment to things. 0  I have blamed myself unnecessarily when things went wrong. 0  I have been anxious or worried for no good reason. 0  I have felt scared or panicky for no good reason. 0  Things have been getting on top of me. 1  I have been so unhappy that I have had difficulty sleeping. 0  I have felt sad or miserable. 0  I have been so unhappy that I have been crying. 0  The thought of harming myself has occurred to me. 0  Edinburgh Postnatal Depression Scale Total 1      After visit meds:  Allergies as of 06/07/2023       Reactions   Buspirone Anaphylaxis, Other (See Comments)   Dystonic reaction - arms, legs, jaw "locked up" "lockjaw" Jittery, lockjaw, tightened muscles        Medication List     STOP taking these medications    acetaminophen 500 MG tablet Commonly known as: TYLENOL       TAKE these medications    famotidine 20 MG tablet Commonly known as: PEPCID Take 1 tablet (20 mg total) by mouth at bedtime.   ferrous sulfate 325 (65 FE) MG tablet Take 1 tablet (325 mg total) by mouth every other day.   ibuprofen 600 MG tablet Commonly known as: ADVIL Take 1 tablet (600 mg total) by mouth every 6 (six) hours.   NIFEdipine 30 MG 24 hr tablet Commonly known as: PROCARDIA-XL/NIFEDICAL-XL Take 1 tablet (30 mg total) by mouth daily. Can increase to twice a day as needed for symptomatic contractions What changed: when to take this   ondansetron 4 MG disintegrating tablet Commonly known as: ZOFRAN-ODT Take 1 tablet (4 mg total) by mouth every 6 (six) hours as needed for nausea or vomiting.   oxyCODONE-acetaminophen 5-325 MG tablet Commonly known as: PERCOCET/ROXICET Take 1 tablet by mouth every 4 (four) hours as needed for severe pain (pain score 7-10).   PRENATAL PO Take 1 tablet by mouth daily.         Discharge home in stable condition Infant Feeding: Bottle Infant Disposition:home with  mother Discharge instruction: per After Visit Summary and Postpartum booklet. Activity: Advance as tolerated. Pelvic rest for 6 weeks.  Diet: routine diet Anticipated Birth Control: BTL done PP Postpartum Appointment:6 weeks Additional Postpartum F/U: Postpartum Depression checkup, Incision check 1 week, and BP check 1 week Future Appointments: Future Appointments  Date Time Provider Department Center  06/17/2023 10:55 AM Logan Bores Ellsworth Lennox, MD [facility] None   Follow up Visit:  Follow-up Information     Linzie Collin, MD Follow up on 06/17/2023.   Specialties: Obstetrics and Gynecology, Radiology Why: Post-op visit on 11/26 @ 10:55 am Contact information: [address] [place] [address] [phone]                    Glenetta Borg, CNM  06/07/2023

## 2023-06-05 NOTE — Progress Notes (Addendum)
  Postpartum Day # 1: Cesarean Delivery (repeat). Also with GHTN with superimposed pre-eclampsia with severe features.   Subjective: Patient reports tolerating PO.  Has not yet ambulated or voided as catheter in place. Denies headaches, chest pain, SOB.    Objective: Vital signs in last 24 hours: Temp:  [97.9 F (36.6 C)-98.1 F (36.7 C)] 98.1 F (36.7 C) (11/14 0850) Pulse Rate:  [79-108] 98 (11/14 1630) Resp:  [9-27] 20 (11/14 0850) BP: (121-160)/(70-103) 144/88 (11/14 1630) SpO2:  [86 %-100 %] 100 % (11/14 1630)  I/O last 3 completed shifts: In: 3608.3 [P.O.:82; I.V.:3325.8; IV Piggyback:200.5] Out: 3795 [Urine:3190; Blood:605] Total I/O In: 1092.4 [I.V.:1092.4] Out: 2985 [Urine:2985]   Physical Exam:  General: alert and no distress Lungs: clear to auscultation bilaterally Breasts: normal appearance, no masses or tenderness Heart: regular rate and rhythm, S1, S2 normal, no murmur, click, rub or gallop Abdomen: soft, non-tender; bowel sounds normal; no masses,  no organomegaly Pelvis: Lochia appropriate, Uterine Fundus firm, Incision: bandage clean/dry/intact Extremities: DVT Evaluation: No evidence of DVT seen on physical exam. Negative Homan's sign. No cords or calf tenderness. No significant calf/ankle edema.  Labs:     Latest Ref Rng & Units 06/05/2023    4:16 AM 06/04/2023    4:58 PM 06/04/2023    8:32 AM  CBC  WBC 4.0 - 10.5 K/uL 11.1  11.0  9.2   Hemoglobin 12.0 - 15.0 g/dL 7.9  40.9  81.1   Hematocrit 36.0 - 46.0 % 23.3  31.2  33.4   Platelets 150 - 400 K/uL 192  273  245        Latest Ref Rng & Units 06/05/2023    4:16 AM 06/04/2023    4:58 PM  CMP  Glucose 70 - 99 mg/dL 87  82   BUN 6 - 20 mg/dL <5  6   Creatinine 9.14 - 1.00 mg/dL 7.82  9.56   Sodium 213 - 145 mmol/L 132  136   Potassium 3.5 - 5.1 mmol/L 2.7  3.2   Chloride 98 - 111 mmol/L 102  102   CO2 22 - 32 mmol/L 24  23   Calcium 8.9 - 10.3 mg/dL 6.7  9.0   Total Protein 6.5 - 8.1 g/dL 5.5   7.2   Total Bilirubin <1.2 mg/dL 0.3  0.3   Alkaline Phos 38 - 126 U/L 110  124   AST 15 - 41 U/L 21  16   ALT 0 - 44 U/L 11  11      Assessment/Plan: - Status post Cesarean section. Doing well postoperatively.  - Formula feeding, may consider breast - Circumcision prior to discharge - Contraception: BTL, performed intraoperatively - Regular diet - Continue PO pain management - Remove foley catheter after discontinuation of magnesium - Continue Mag Sulfate for 24 hrs  postpartum - Iron deficiency anemia secondary acute blood loss.  - Continue potassium supplementation, received 50 mEQ prior to surgery yesterday, improved from 2.7 to 3.2, however low again this morning. Will continue with supplementation.  - Continue current care.   Hildred Laser, MD [facility]

## 2023-06-06 ENCOUNTER — Inpatient Hospital Stay
Admission: RE | Admit: 2023-06-06 | Payer: Medicaid Other | Source: Home / Self Care | Admitting: Obstetrics and Gynecology

## 2023-06-06 LAB — SURGICAL PATHOLOGY

## 2023-06-06 LAB — CBC
HCT: 22.8 % — ABNORMAL LOW (ref 36.0–46.0)
Hemoglobin: 7.6 g/dL — ABNORMAL LOW (ref 12.0–15.0)
MCH: 27.8 pg (ref 26.0–34.0)
MCHC: 33.3 g/dL (ref 30.0–36.0)
MCV: 83.5 fL (ref 80.0–100.0)
Platelets: 213 10*3/uL (ref 150–400)
RBC: 2.73 MIL/uL — ABNORMAL LOW (ref 3.87–5.11)
RDW: 16.7 % — ABNORMAL HIGH (ref 11.5–15.5)
WBC: 8.9 10*3/uL (ref 4.0–10.5)
nRBC: 0 % (ref 0.0–0.2)

## 2023-06-06 LAB — COMPREHENSIVE METABOLIC PANEL
ALT: 12 U/L (ref 0–44)
AST: 21 U/L (ref 15–41)
Albumin: 2.4 g/dL — ABNORMAL LOW (ref 3.5–5.0)
Alkaline Phosphatase: 108 U/L (ref 38–126)
Anion gap: 9 (ref 5–15)
BUN: 9 mg/dL (ref 6–20)
CO2: 21 mmol/L — ABNORMAL LOW (ref 22–32)
Calcium: 7.1 mg/dL — ABNORMAL LOW (ref 8.9–10.3)
Chloride: 105 mmol/L (ref 98–111)
Creatinine, Ser: 0.73 mg/dL (ref 0.44–1.00)
GFR, Estimated: >60 mL/min (ref >60)
Glucose, Bld: 89 mg/dL (ref 70–99)
Potassium: 3.2 mmol/L — ABNORMAL LOW (ref 3.5–5.1)
Sodium: 135 mmol/L (ref 135–145)
Total Bilirubin: 0.2 mg/dL (ref <1.2)
Total Protein: 5.4 g/dL — ABNORMAL LOW (ref 6.5–8.1)

## 2023-06-06 MED ORDER — SODIUM CHLORIDE 0.9 % IV SOLN: INTRAVENOUS | Status: AC | PRN

## 2023-06-06 MED ORDER — SODIUM CHLORIDE 0.9 % IV BOLUS: 500.0000 mL | Freq: Once | INTRAVENOUS | Status: DC | PRN

## 2023-06-06 MED ORDER — OXYCODONE HCL 5 MG PO TABS
5.0000 mg | ORAL_TABLET | ORAL | Status: DC | PRN
  Administered 2023-06-06 – 2023-06-07 (×2): 10 mg via ORAL
  Filled 2023-06-06 (×2): qty 2

## 2023-06-06 MED ORDER — DIPHENHYDRAMINE HCL 50 MG/ML IJ SOLN: 25.0000 mg | Freq: Once | INTRAMUSCULAR | Status: DC | PRN

## 2023-06-06 MED ORDER — EPINEPHRINE PF 1 MG/ML IJ SOLN: 0.3000 mg | Freq: Once | INTRAMUSCULAR | Status: DC | PRN

## 2023-06-06 MED ORDER — IRON SUCROSE 500 MG IVPB - SIMPLE MED
500.0000 mg | Freq: Once | INTRAVENOUS | Status: AC
  Administered 2023-06-06: 500 mg via INTRAVENOUS
  Filled 2023-06-06: qty 500

## 2023-06-06 MED ORDER — ALBUTEROL SULFATE (2.5 MG/3ML) 0.083% IN NEBU: 2.5000 mg | INHALATION_SOLUTION | Freq: Once | RESPIRATORY_TRACT | Status: DC | PRN

## 2023-06-06 MED ORDER — POTASSIUM CHLORIDE CRYS ER 20 MEQ PO TBCR
40.0000 meq | EXTENDED_RELEASE_TABLET | Freq: Once | ORAL | Status: AC
  Administered 2023-06-06: 40 meq via ORAL
  Filled 2023-06-06: qty 2

## 2023-06-06 MED ORDER — METHYLPREDNISOLONE SODIUM SUCC 125 MG IJ SOLR: 125.0000 mg | Freq: Once | INTRAMUSCULAR | Status: DC | PRN

## 2023-06-06 NOTE — Lactation Note (Signed)
This note was copied from a baby's chart. Lactation Consultation Note  Patient Name: Tiffany Guzman [id number] Date: 06/06/2023 Age:27 hours Reason for consult: Follow-up assessment;Early term 80-38.6wks   Maternal Data LC met w/ 39hr old baby Tiffany and both parents.  Patient stated that she has attempted to put infant to the breast but he doesn't suckle and doesn't seem like he likes.  Patients goal is still to provide colostrum in hospital and do formula once home.   Patient stated that she would like assistance attempting a feed again at the breast later on.   Feeding Mother's Current Feeding Choice: Breast Milk and Formula Nipple Type: Slow - flow  Interventions Interventions: Breast feeding basics reviewed;Education  LC discussed suppression of milk supply with parents but also discuss the benefits of breastfeeding.    Discharge Discharge Education: Engorgement and breast care;Outpatient recommendation;Other (comment) (Suppression Education)  Consult Status Consult Status: Complete Follow-up type: Call as needed    Tiffany Guzman S Mikell Camp 06/06/2023, 10:07 AM

## 2023-06-06 NOTE — Lactation Note (Signed)
This note was copied from a baby's chart. Lactation Consultation Note  Patient Name: [name] Date: 06/06/2023 Age:28 hours Reason for consult: Mother's request;Early term 37-38.6wks;Breastfeeding assistance   Maternal Data Patient called out for lactation support to assist w/ a feeding at the breast.  Feeding Mother's Current Feeding Choice: Breast Milk and Formula  LC had patient undress infant, and place to the left breast.  Patient demonstrated what she had been doing prior to a feeding.  LC provided education on how to "sandwich" the breast, and support infant at the breast.  Infant opens mouth wide, grasp breast and actively feeds for at least 10 minutes in the presence of LC.  Patient endorsed deep tugs.  Patient verbalized happiness about the latch and feeding.   LATCH Score Latch: Grasps breast easily, tongue down, lips flanged, rhythmical sucking.  Audible Swallowing: Spontaneous and intermittent  Type of Nipple: Everted at rest and after stimulation (Short)  Comfort (Breast/Nipple): Soft / non-tender  Hold (Positioning): Assistance needed to correctly position infant at breast and maintain latch.  LATCH Score: 9  Interventions Interventions: Assisted with latch;Breast compression;Adjust position;Support pillows;Position options;Education  Discharge Discharge Education: Engorgement and breast care;Outpatient recommendation;Other (comment) (Suppression Education)  Outpatient phone number written on board to help support mom for services. Consult Status Consult Status: Complete Follow-up type: Call as needed    Yvette Rack Seraiah Nowack 06/06/2023, 12:19 PM

## 2023-06-06 NOTE — Progress Notes (Signed)
Progress Note - Cesarean Delivery  Tiffany Guzman is a 27 y.o. G2P2002 now PP day 2 s/p C-Section, Low Transverse.   Subjective:  Patient reports no problems with eating, passing gas, voiding, or their wound. She has not had a bowel movement yet. She has mild abdominal distension    Objective:  Vital signs in last 24 hours: Temp:  [98.1 F (36.7 C)-98.8 F (37.1 C)] 98.8 F (37.1 C) (11/15 0800) Pulse Rate:  [84-105] 100 (11/15 0800) Resp:  [16-20] 20 (11/15 0800) BP: (128-155)/(73-95) 141/90 (11/15 0800) SpO2:  [90 %-100 %] 98 % (11/15 0800)  Physical Exam:  General: alert, cooperative, and appears stated age 20: appropriate Uterine Fundus: firm @u  Incision: no significant drainage, Dressing clean & dry Bowel sounds present     Data Review Recent Labs    06/05/23 0416 06/06/23 0501  HGB 7.9* 7.6*  HCT 23.3* 22.8*    Assessment:  Principal Problem:   Preeclampsia Active Problems:   History of cesarean delivery affecting pregnancy   Hypokalemia   Supervision of other normal pregnancy, antepartum     Overview:      Clinical Staff Provider      Office Location  [facility] Dating  10/23/2022, [redacted]w[redacted]d       Language  English Anatomy US        Flu Vaccine   declined Genetic Screen  NIPS: Negative/Female      TDaP vaccine   declined Hgb A1C or      GTT Early :     Third trimester : 140, 3 hr GTT nml      Covid declined   LAB RESULTS       Rhogam  B/Positive/-- (04/29 1004)  Blood Type B/Positive/-- (04/29 1004)            Feeding Plan breast Antibody Negative (04/29 1004)      Contraception BTL Rubella 0.94 (04/29 1004)      Circumcision yes RPR Non Reactive (04/29 1004)       Pediatrician  [facility] HBsAg Negative (04/29 1004)       Support Person Josh HIV Non Reactive (04/29 1004)      Prenatal Classes no Varicella  Immune (11/18/1002)        GBS  (For PCN allergy, check sensitivities)       BTL Consent Completed Hep C Non Reactive (04/29 1004)        VBAC Consent  Pap Diagnosis      Date Value Ref Range Status      12/05/2022   Final       - Negative for intraepithelial lesion or malignancy (NILM)              Hgb Electro          CF          SMA                        Rubella non-immune status, antepartum   Unwanted fertility   Gestational hypertension, third trimester   Status post Cesarean section. Doing well postoperatively.   Repeat dose of potassium chloride ordered. Continue procardia XL 30 mg.   Plan:       Continue current care.  Plan for discharge tomorrow.   Doreene Burke, CNM  06/06/2023 8:51 AM

## 2023-06-07 LAB — CBC
HCT: 26.1 % — ABNORMAL LOW (ref 36.0–46.0)
Hemoglobin: 8.3 g/dL — ABNORMAL LOW (ref 12.0–15.0)
MCH: 27.7 pg (ref 26.0–34.0)
MCHC: 31.8 g/dL (ref 30.0–36.0)
MCV: 87 fL (ref 80.0–100.0)
Platelets: 243 10*3/uL (ref 150–400)
RBC: 3 MIL/uL — ABNORMAL LOW (ref 3.87–5.11)
RDW: 16.7 % — ABNORMAL HIGH (ref 11.5–15.5)
WBC: 10.3 10*3/uL (ref 4.0–10.5)
nRBC: 0 % (ref 0.0–0.2)

## 2023-06-07 LAB — COMPREHENSIVE METABOLIC PANEL
ALT: 20 U/L (ref 0–44)
AST: 32 U/L (ref 15–41)
Albumin: 2.5 g/dL — ABNORMAL LOW (ref 3.5–5.0)
Alkaline Phosphatase: 141 U/L — ABNORMAL HIGH (ref 38–126)
Anion gap: 10 (ref 5–15)
BUN: 9 mg/dL (ref 6–20)
CO2: 22 mmol/L (ref 22–32)
Calcium: 8.1 mg/dL — ABNORMAL LOW (ref 8.9–10.3)
Chloride: 106 mmol/L (ref 98–111)
Creatinine, Ser: 0.71 mg/dL (ref 0.44–1.00)
GFR, Estimated: >60 mL/min (ref >60)
Glucose, Bld: 77 mg/dL (ref 70–99)
Potassium: 3.4 mmol/L — ABNORMAL LOW (ref 3.5–5.1)
Sodium: 138 mmol/L (ref 135–145)
Total Bilirubin: 0.6 mg/dL (ref <1.2)
Total Protein: 5.9 g/dL — ABNORMAL LOW (ref 6.5–8.1)

## 2023-06-07 MED ORDER — ONDANSETRON 4 MG PO TBDP
ORAL_TABLET | ORAL | Status: AC
  Administered 2023-06-07: 4 mg via ORAL
  Filled 2023-06-07: qty 1

## 2023-06-07 MED ORDER — MAGNESIUM HYDROXIDE 400 MG/5ML PO SUSP: 30.0000 mL | Freq: Every day | ORAL | Status: DC | PRN

## 2023-06-07 MED ORDER — IBUPROFEN 600 MG PO TABS: 600.0000 mg | ORAL_TABLET | Freq: Four times a day (QID) | ORAL | 0 refills | Status: DC

## 2023-06-07 MED ORDER — POLYETHYLENE GLYCOL 3350 17 G PO PACK
17.0000 g | PACK | Freq: Every day | ORAL | Status: DC
  Administered 2023-06-07: 17 g via ORAL
  Filled 2023-06-07: qty 1

## 2023-06-07 MED ORDER — ONDANSETRON 4 MG PO TBDP: 4.0000 mg | ORAL_TABLET | Freq: Four times a day (QID) | ORAL | 0 refills | Status: DC | PRN

## 2023-06-07 MED ORDER — ONDANSETRON 4 MG PO TBDP: 4.0000 mg | ORAL_TABLET | Freq: Four times a day (QID) | ORAL | Status: DC | PRN

## 2023-06-07 MED ORDER — DOCUSATE SODIUM 50 MG/5ML PO LIQD: 100.0000 mg | Freq: Two times a day (BID) | ORAL | Status: DC

## 2023-06-07 MED ORDER — OXYCODONE-ACETAMINOPHEN 5-325 MG PO TABS: 1.0000 | ORAL_TABLET | ORAL | 0 refills | Status: DC | PRN

## 2023-06-07 MED ORDER — SENNA 8.6 MG PO TABS: 1.0000 | ORAL_TABLET | Freq: Every day | ORAL | Status: DC

## 2023-06-07 NOTE — Plan of Care (Signed)
  Problem: Skin Integrity: Goal: Demonstration of wound healing without infection will improve Outcome: Progressing   

## 2023-06-07 NOTE — Final Progress Note (Signed)
Progress Note Aleicia is feeling much better. She is passing gas and is no longer lightheaded or nauseated. She denies HA, visual changes, and RUQ pain. She is ready to go home.  Objective BP 133/85 (BP Location: Left Arm)   Pulse 89   Temp 98.6 F (37 C) (Oral)   Resp 18   Ht 5\' 4"  (1.626 m)   Wt 84.8 kg   LMP 08/31/2022 (Exact Date)   SpO2 95%   Breastfeeding Unknown   BMI 32.10 kg/m    Abdomen: soft, non-distended, non-tender, normal BS Incision: dressing c/d/i, no drainage or erythema Fundus: firm Lochia: WNL  Assessment/Plan Post-op day 3 Discharge home Continue Procardia, PO iron, and PNV Discharge instructions reviewed Instructed to use stool softeners PRN Incision/BP check in one week. PP office visit in 6 weeks. BTL done  Glenetta Borg, CNM

## 2023-06-07 NOTE — Lactation Note (Signed)
This note was copied from a baby's chart. Lactation Consultation Note  Patient Name: Tiffany Guzman [id number] Date: 06/07/2023 Age:27 hours Reason for consult: Follow-up assessment;Early term 37-38.6wks;Maternal discharge   Maternal Data Has patient been taught Hand Expression?: Yes Does the patient have breastfeeding experience prior to this delivery?: No  Feeding Mother's Current Feeding Choice: Breast Milk and Formula Nipple Type: Slow - flow Mom sleeping when room entered, mom stated she had no needs, mom and baby for discharge this am, does not plan on breastfeeding after discharge at this time  [facility] Score Latch:  (I did not observe a feeding)                  Lactation Tools Discussed/Used    Interventions  LC name and no updated on white board  Discharge    Consult Status Consult Status: Complete Date: 06/07/23 Follow-up type: In-patient    Dyann Kief 06/07/2023, 11:22 AM

## 2023-06-07 NOTE — Progress Notes (Signed)
Subjective: Postpartum Day 3: Cesarean Delivery Tiffany Guzman is feeling lightheaded when she gets up, nauseated, and a little off today. She has been passing a little flatus but has not yet had a bowel movement. She received and iron infusion yesterday. She is breast and bottle feeding.    Objective: Vital signs in last 24 hours: Temp:  [97.9 F (36.6 C)-99 F (37.2 C)] 98.6 F (37 C) (11/16 0755) Pulse Rate:  [87-101] 90 (11/16 0755) Resp:  [16-20] 20 (11/16 0755) BP: (127-152)/(83-93) 127/83 (11/16 0755) SpO2:  [95 %-99 %] 95 % (11/16 0755)  Physical Exam:  General: alert, cooperative, and pale Heart: RRR Lungs: CTAB Abdomen: mildly distended, normal bowel sounds, non-tender Lochia: appropriate Uterine Fundus: firm Incision: healing well, no significant drainage DVT Evaluation: No evidence of DVT seen on physical exam.  Recent Labs    06/05/23 0416 06/06/23 0501  HGB 7.9* 7.6*  HCT 23.3* 22.8*    Assessment/Plan: Status post Cesarean section.   CBC ordered. Consider blood transfusions if still symptomatic. Encouraged ambulation, hydration, and bowel management Will check in this afternoon and consider discharge if showing improvement   Glenetta Borg, CNM 06/07/2023, 9:03 AM

## 2023-06-07 NOTE — Progress Notes (Signed)
Patient discharged. Discharge instructions given. Patient verbalizes understanding. Transported by axillary. 

## 2023-06-07 NOTE — Discharge Instructions (Signed)
Discharge instructions Bleeding: Your bleeding could continue up to 6 weeks, the flow should gradually decrease and the color should become dark then lightened over the next couple of weeks. If you notice you are bleeding heavily or passing clots larger than the size of your fist, PLEASE call your physician. No TAMPONS, DOUCHING, ENEMAS OR SEXUAL INTERCOURSE for 6 weeks. Incision: The honeycomb dressing can be removed in 5-7 days. Remove earlier if any water gets underneath it or if there is a lot of drainage. After the dressing is off, you can let the warm soapy water from the shower run over the incision and pat dry with a clean towel. Watch the incision for signs of infection, such as, redness, warmth, oozing pus. AfterPains: This is the uterus contracting back to its normal position and size. Use medications prescribed or recommended by your physician to help relieve this discomfort. Bowels/Hemorrhoids: Drink plenty of water and stay active. Increase fiber, fresh fruits and vegetables in your diet. Rest/Activity: Rest when the baby is resting;  Do not lift > 10 lbs for 6 weeks. No driving for 1-2 weeks. Bathing: Shower daily! Diet: Continue daily prenatal vitamin and iron until your follow up visit to help replenish nutrients and vitamins. If breastfeeding eat extra calories and increase your fluid intake to 12 glasses a day. Contraception: Consult with your provider on what method of birth control you would like to use. Breastfeeding: You may have a slight fever when your milk comes in, but it should go away on its own. If it does not, and rises above 101.0 please call the doctor. Bottlefeeding: wear a snug fitting bra without underwires continuously for 3-5days, avoid any nipple/breast stimulation. If engorgement occurs, take ibuprofen as prescribed and apply fresh green cabbage leaves directly to your breasts inside the bra cups. Postpartum "BLUES": It is common to emotional days after delivery,  however if it persist for greater than 2 weeks or if you feel concerned please let your physician know immediately. This is hormone driven and nothing you can control so please let someone know how you feel. Follow Up Visit: Please schedule a follow up visit with your delivering provider  Call office if you have any of the following: headache, visual changes, fever >101.0 F, chills, breast concerns, excessive vaginal bleeding, incision drainage or problems, leg pain or redness, depression or any other concerns.  For concerns about your baby, please call your pediatrician For breastfeeding concerns, the lactation consultant can be reached at [phone]

## 2023-06-09 ENCOUNTER — Telehealth: Payer: Self-pay | Admitting: Obstetrics and Gynecology

## 2023-06-09 NOTE — Telephone Encounter (Addendum)
Contacted the patient via phone, she  needs a postpartum visit with Dr. Logan Bores in 6 weeks. I left message advising scheduled appointment for Tuesday, 12/31 at 1:35 pm.

## 2023-06-10 NOTE — Telephone Encounter (Signed)
The patient is aware via my chart 

## 2023-06-16 NOTE — Progress Notes (Unsigned)
    OBSTETRICS/GYNECOLOGY POST-OPERATIVE CLINIC VISIT  Subjective:     Tiffany Guzman is a 27 y.o. female who presents to the clinic 1 weeks status post REPEAT CESAREAN DELIVERY WITH TUBAL  for Term pregnancy, Previous Cesarean Delivery; sterilization and gHTN with superimposed pre-eclampsia with severe features . Eating a regular diet without difficulty. Bowel movements are normal. Pain is controlled with current analgesics. Medications being used: ibuprofen (OTC).  Bleeding is normal, denies passage of large clots.  Is breast and formula feeding, using Similac Total Comfort. Notes her mood has been ok for the most part, has some occasional postpartum blues. Did report a headache for 2 days last week that did initially did not resolve with Tylenol.   The following portions of the patient's history were reviewed and updated as appropriate: allergies, current medications, past family history, past medical history, past social history, past surgical history, and problem list.  Review of Systems Pertinent items are noted in HPI.   Objective:   BP 123/87   Pulse 83   Resp 16   Ht 5\' 4"  (1.626 m)   Wt 161 lb 4.8 oz (73.2 kg)   Breastfeeding Yes   BMI 27.69 kg/m  Body mass index is 27.69 kg/m.  General:  alert and no distress  Abdomen: soft, bowel sounds active, non-tender  Incision:   healing well, no drainage, no erythema, no hernia, no seroma, no swelling, no dehiscence, incision well approximated. Steristrips in place.    Pathology:   None      06/17/2023   10:27 AM 06/05/2023    8:42 PM 10/30/2022    9:33 AM 01/09/2018    1:38 PM  Edinburgh Postnatal Depression Scale Screening Tool  I have been able to laugh and see the funny side of things. 0 0 0 0  I have looked forward with enjoyment to things. 0 0 0 0  I have blamed myself unnecessarily when things went wrong. 1 0 2 2  I have been anxious or worried for no good reason. 0 0 2 1  I have felt scared or panicky for no  good reason. 0 0 0 0  Things have been getting on top of me. 1 1 1  0  I have been so unhappy that I have had difficulty sleeping. 0 0 0 0  I have felt sad or miserable. 0 0 0 0  I have been so unhappy that I have been crying. 0 0 0 0  The thought of harming myself has occurred to me. 0 0 0 0  Edinburgh Postnatal Depression Scale Total 2 1 5 3       Assessment:   Patient s/p REPEAT CESAREAN DELIVERY WITH TUBAL.  Doing well postoperatively. Screen for maternal depression Pre-eclampsia affecting childbirth  Plan:   1. Continue any current medications as instructed by provider. Continue Nifidpine until 6 weeks postpartum. To notify provider if symptoms of hypotension develop over next several weeks.  2. Wound care discussed. 3. Operative findings again reviewed. Pathology report discussed. 4. Activity restrictions: no bending, stooping, or squatting, no lifting more than 10-15 pounds, and pelvic rest 5. Anticipated return to work:  5-6 weeks if applicable . 6. Follow up: 5 weeks for 6 week postpartum care   Hildred Laser, MD [facility]

## 2023-06-17 ENCOUNTER — Ambulatory Visit: Payer: Medicaid Other | Admitting: Obstetrics and Gynecology

## 2023-06-17 ENCOUNTER — Encounter: Payer: Self-pay | Admitting: Obstetrics and Gynecology

## 2023-06-17 ENCOUNTER — Encounter: Payer: Medicaid Other | Admitting: Obstetrics and Gynecology

## 2023-06-17 VITALS — BP 123/87 | HR 83 | Resp 16 | Ht 64.0 in | Wt 161.3 lb

## 2023-06-17 DIAGNOSIS — Z1332 Encounter for screening for maternal depression: Secondary | ICD-10-CM

## 2023-06-17 DIAGNOSIS — O1494 Unspecified pre-eclampsia, complicating childbirth: Secondary | ICD-10-CM

## 2023-06-17 DIAGNOSIS — Z98891 History of uterine scar from previous surgery: Secondary | ICD-10-CM

## 2023-06-17 DIAGNOSIS — Z4889 Encounter for other specified surgical aftercare: Secondary | ICD-10-CM

## 2023-06-17 NOTE — Patient Instructions (Signed)
WHAT IS THE FOURTH TRIMESTER?  The fourth trimester is the 12 weeks following the birth of a newborn. In these first few months of your baby's life, it's an important time to create a bond with them. It's also a period of adjustment as your baby adapts to life outside of the womb.  Why Is It Called the Fourth Trimester? The first trimester of pregnancy is from 1 to 14 weeks, the second trimester is from 14 to 28 weeks, and the third trimester is from 28 weeks until your baby is born. The fourth trimester includes the first weeks after you've given birth. This is the time when both you and your baby adjust to life after delivery.  The term fourth trimester was coined in 2002 by pediatrician Harvey Karp, MD. He claimed that you should try to recreate the kind of environment that your baby had while still in the womb. There are several ways that you can do this.  Skin-to-skin contact. To help recreate what your baby's life in the womb was like, you and your partner can share skin-to-skin contact with your baby. This way, your baby can feel your heartbeat and the warmth of your skin, which are both comforting and familiar to them. You can also get this contact with your baby during breastfeeding.   Swaddling and moving. While in the womb, your baby was in a small, confined space. You can recreate this sense of safety and security for them by swaddling. Studies show that babies may sleep better when swaddled. You can also get this same effect by carrying your baby in a sling close to your body.  Movement is also very comforting for your baby. Since they are used to the movement of your body from being in the womb, movement during the fourth trimester for infants is familiar to them.   What to Expect in the Fourth Trimester The fourth trimester is an important time for newborns. Typically, they have their first pediatric checkup during the first week after they're born. This doctor visit is important to  monitor their health and development. Your baby's pediatrician will continue to closely monitor their well-being throughout the following weeks.  In these first months after birth, your baby is just learning how to use their senses to process the world around them. They are totally dependent on you to care for them and to understand their needs. It's during this time that your baby will likely learn how to start doing some things independently, like:  Making noises to communicate Holding their head up without help Keeping their attention on objects and follow them Using their muscles Smiling  Your baby has a lot of adjusting to do outside of the womb. Their brains are taking in new sensations like tastes, smells, and sounds. During this time, it's important to follow their cues when it comes to sleeping, crying, and feeding.  In the fourth trimester, babies need 14 to 17 hours of sleep each day, even though their sleep schedule won't be predictable. After the fourth trimester, your baby may start to sleep through the night. They will also need to be fed every 2 to 3 hours because their little body is growing so quickly.  When it comes to crying, you can try swaddling or rocking your baby to create that familiar environment for them. Other times, your baby will cry because they are hungry, need a diaper change, or simply want to be held by you.  Tips for Mom For   moms, you'll notice that the fourth trimester is a period of great change for you, too. Before delivery, the mother's health is monitored quite closely. After birth, the focus usually shifts from you to the health of your baby. But it's just as important that new mothers get good postpartum care, too.   Your body is adjusting from pregnancy to healing from birth. Some common side effects that you might feel from childbirth include:  Changes in hormones Swelling Postpartum bleeding A general feeling of discomfort  These effects, plus  the lack of sleep, can cause you to feel stress or anxiety, or to have mood swings. It's important to talk to your doctor about how you feel after giving birth and to not be afraid to ask for help when you need it.  To help yourself stay well during the 4th trimester, make sure you:  Eat a nutritious, balanced diet. Try to get rest when your schedule and your baby's schedule allow. Check in with your doctor or midwife if you experience pain or feel unwell. Talk to your doctor if you feel like you might have symptoms of postpartum depression.  [email or web address].com/baby/what-[email or web address]-the-fourth-[email or web address]  

## 2023-06-20 ENCOUNTER — Encounter: Payer: Self-pay | Admitting: Obstetrics and Gynecology

## 2023-07-22 ENCOUNTER — Ambulatory Visit: Payer: Medicaid Other | Admitting: Obstetrics and Gynecology

## 2023-07-24 ENCOUNTER — Ambulatory Visit (INDEPENDENT_AMBULATORY_CARE_PROVIDER_SITE_OTHER): Payer: Medicaid Other | Admitting: Licensed Practical Nurse

## 2023-07-24 ENCOUNTER — Ambulatory Visit: Payer: Medicaid Other | Admitting: Obstetrics and Gynecology

## 2023-07-24 DIAGNOSIS — Z1332 Encounter for screening for maternal depression: Secondary | ICD-10-CM

## 2023-07-24 DIAGNOSIS — I10 Essential (primary) hypertension: Secondary | ICD-10-CM

## 2023-07-24 MED ORDER — NIFEDIPINE ER OSMOTIC RELEASE 30 MG PO TB24: 30.0000 mg | ORAL_TABLET | Freq: Every day | ORAL | 3 refills | Status: DC
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
## 2023-07-27 DIAGNOSIS — I10 Essential (primary) hypertension: Secondary | ICD-10-CM | POA: Insufficient documentation

## 2023-07-30 ENCOUNTER — Ambulatory Visit: Payer: Medicaid Other | Admitting: Obstetrics and Gynecology

## 2023-08-19 NOTE — Progress Notes (Unsigned)
Tiffany Guzman

## 2023-08-20 ENCOUNTER — Encounter: Payer: Self-pay | Admitting: Family Medicine

## 2023-08-20 ENCOUNTER — Ambulatory Visit (INDEPENDENT_AMBULATORY_CARE_PROVIDER_SITE_OTHER): Payer: Medicaid Other | Admitting: Family Medicine

## 2023-08-20 VITALS — BP 130/70 | HR 88 | Resp 16 | Ht 64.0 in | Wt 154.0 lb

## 2023-08-20 DIAGNOSIS — I10 Essential (primary) hypertension: Secondary | ICD-10-CM

## 2023-08-20 DIAGNOSIS — D509 Iron deficiency anemia, unspecified: Secondary | ICD-10-CM | POA: Diagnosis not present

## 2023-08-20 DIAGNOSIS — F419 Anxiety disorder, unspecified: Secondary | ICD-10-CM | POA: Diagnosis not present

## 2023-08-20 MED ORDER — NIFEDIPINE ER OSMOTIC RELEASE 30 MG PO TB24: 30.0000 mg | ORAL_TABLET | Freq: Every day | ORAL | 1 refills | Status: DC

## 2023-08-20 NOTE — Patient Instructions (Signed)
Blood Pressure Record Sheet  Blood pressure log Date: _______________________ a.m. _____________________(1st reading) _____________________(2nd reading) p.m. _____________________(1st reading) _____________________(2nd reading) Date: _______________________ a.m. _____________________(1st reading) _____________________(2nd reading) p.m. _____________________(1st reading) _____________________(2nd reading) Date: _______________________ a.m. _____________________(1st reading) _____________________(2nd reading) p.m. _____________________(1st reading) _____________________(2nd reading) Date: _______________________ a.m. _____________________(1st reading) _____________________(2nd reading) p.m. _____________________(1st reading) _____________________(2nd reading) Date: _______________________ a.m. _____________________(1st reading) _____________________(2nd reading) p.m. _____________________(1st reading) _____________________(2nd reading) Date: _______________________ a.m. _____________________(1st reading) _____________________(2nd reading) p.m. _____________________(1st reading) _____________________(2nd reading) Date: _______________________ a.m. _____________________(1st reading) _____________________(2nd reading) p.m. _____________________(1st reading) _____________________(2nd reading) Date: _______________________ a.m. _____________________(1st reading) _____________________(2nd reading) p.m. _____________________(1st reading) _____________________(2nd reading) Date: _______________________ a.m. _____________________(1st reading) _____________________(2nd reading) p.m. _____________________(1st reading) _____________________(2nd reading) Date: _______________________ a.m. _____________________(1st reading) _____________________(2nd reading) p.m. _____________________(1st reading) _____________________(2nd reading)

## 2023-08-20 NOTE — Assessment & Plan Note (Signed)
Prior to pregnancy baseline SBP was 110-120's Today on nifedipine for the past 2 weeks bp 130/70 BP Readings from Last 3 Encounters:  08/20/23 130/70  07/24/23 (!) 139/95  06/17/23 123/87  Will keep her on meds and monitor BP at home, discussed diet lifestyle efforts/dash, how to take BP properly, goal BP <130/80 She is also dealing with some stress/anxiety and this may be contributing to higher BP Will recheck her in 3 months Discussed concerning sx to watch for in the postpartum period, today no red flags or concerns

## 2023-08-20 NOTE — Assessment & Plan Note (Signed)
Recheck CBC and iron panel to see if she still needs iron supplement Hemoglobin  Date Value Ref Range Status  06/07/2023 8.3 (L) 12.0 - 15.0 g/dL Final  95/62/1308 7.6 (L) 12.0 - 15.0 g/dL Final  65/78/4696 7.9 (L) 12.0 - 15.0 g/dL Final  29/52/8413 24.4 (L) 12.0 - 15.0 g/dL Final  07/24/7251 9.8 (L) 11.1 - 15.9 g/dL Final  66/44/0347 9.5 (L) 11.1 - 15.9 g/dL Final  42/59/5638 75.6 11.1 - 15.9 g/dL Final  43/32/9518 84.1 (L) 11.1 - 15.9 g/dL Final   No results found for: "IRON", "TIBC", "FERRITIN"
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
## 2023-08-21 ENCOUNTER — Encounter: Payer: Self-pay | Admitting: Family Medicine

## 2023-08-21 LAB — COMPLETE METABOLIC PANEL WITH GFR
AG Ratio: 1.5 (calc) (ref 1.0–2.5)
ALT: 22 U/L (ref 6–29)
AST: 17 U/L (ref 10–30)
Albumin: 4.8 g/dL (ref 3.6–5.1)
Alkaline phosphatase (APISO): 93 U/L (ref 31–125)
BUN: 18 mg/dL (ref 7–25)
CO2: 26 mmol/L (ref 20–32)
Calcium: 9.6 mg/dL (ref 8.6–10.2)
Chloride: 104 mmol/L (ref 98–110)
Creat: 0.81 mg/dL (ref 0.50–0.96)
Globulin: 3.1 g/dL (ref 1.9–3.7)
Glucose, Bld: 87 mg/dL (ref 65–99)
Potassium: 4.1 mmol/L (ref 3.5–5.3)
Sodium: 138 mmol/L (ref 135–146)
Total Bilirubin: 0.5 mg/dL (ref 0.2–1.2)
Total Protein: 7.9 g/dL (ref 6.1–8.1)
eGFR: 102 mL/min/{1.73_m2} (ref >=60)

## 2023-08-21 LAB — IRON,TIBC AND FERRITIN PANEL
%SAT: 12 % — ABNORMAL LOW (ref 16–45)
Ferritin: 45 ng/mL (ref 16–154)
Iron: 49 ug/dL (ref 40–190)
TIBC: 401 ug/dL (ref 250–450)

## 2023-08-21 LAB — CBC WITH DIFFERENTIAL/PLATELET
Absolute Lymphocytes: 1401 {cells}/uL (ref 850–3900)
Absolute Monocytes: 502 {cells}/uL (ref 200–950)
Basophils Absolute: 41 {cells}/uL (ref 0–200)
Basophils Relative: 0.5 %
Eosinophils Absolute: 138 {cells}/uL (ref 15–500)
Eosinophils Relative: 1.7 %
HCT: 39.8 % (ref 35.0–45.0)
Hemoglobin: 13 g/dL (ref 11.7–15.5)
MCH: 26.9 pg — ABNORMAL LOW (ref 27.0–33.0)
MCHC: 32.7 g/dL (ref 32.0–36.0)
MCV: 82.4 fL (ref 80.0–100.0)
MPV: 11.4 fL (ref 7.5–12.5)
Monocytes Relative: 6.2 %
Neutro Abs: 6018 {cells}/uL (ref 1500–7800)
Neutrophils Relative %: 74.3 %
Platelets: 323 10*3/uL (ref 140–400)
RBC: 4.83 10*6/uL (ref 3.80–5.10)
RDW: 13.6 % (ref 11.0–15.0)
Total Lymphocyte: 17.3 %
WBC: 8.1 10*3/uL (ref 3.8–10.8)

## 2023-09-02 ENCOUNTER — Ambulatory Visit: Payer: Medicaid Other | Admitting: Family Medicine

## 2023-09-02 ENCOUNTER — Ambulatory Visit: Payer: Self-pay | Admitting: *Deleted

## 2023-09-02 VITALS — BP 122/70 | HR 70 | Resp 16 | Ht 64.0 in | Wt 152.0 lb

## 2023-09-02 DIAGNOSIS — J069 Acute upper respiratory infection, unspecified: Secondary | ICD-10-CM | POA: Diagnosis not present

## 2023-09-02 DIAGNOSIS — L309 Dermatitis, unspecified: Secondary | ICD-10-CM | POA: Diagnosis not present

## 2023-09-02 MED ORDER — LORATADINE 10 MG PO TABS: 10.0000 mg | ORAL_TABLET | Freq: Every day | ORAL | 1 refills | Status: DC

## 2023-09-02 MED ORDER — FAMOTIDINE 20 MG PO TABS: 20.0000 mg | ORAL_TABLET | Freq: Two times a day (BID) | ORAL | 0 refills | Status: AC | PRN

## 2023-09-02 MED ORDER — PREDNISONE 10 MG (21) PO TBPK: ORAL_TABLET | ORAL | 0 refills | Status: DC

## 2023-09-02 MED ORDER — HYDROXYZINE HCL 10 MG PO TABS: 10.0000 mg | ORAL_TABLET | Freq: Three times a day (TID) | ORAL | 1 refills | Status: DC | PRN

## 2023-09-02 NOTE — Telephone Encounter (Signed)
  Chief Complaint: wide spread hives Symptoms: widespread hives- itching, bumps/whelps Frequency: started [place] pm Pertinent Negatives: Patient denies  fever, tongue swelling, difficulty breathing, abdomen pain Disposition: [] ED /[] Urgent Care (no appt availability in office) / [x] Appointment(In office/virtual)/ []  [facility]/ [] Home Care/ [] Refused Recommended Disposition /[] [facility]/ []  Follow-up with PCP Additional Notes: Patient has found that benadryl helps decrease symptoms throughout the day- but the hives reappear next day.
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
## 2023-09-02 NOTE — Telephone Encounter (Signed)
Reason for Disposition  [1] MODERATE-SEVERE hives persist (i.e., hives interfere with normal activities or work) AND [2] taking antihistamine (e.g., Benadryl, Claritin) > 24 hours  Answer Assessment - Initial Assessment Questions 1. APPEARANCE: "What does the rash look like?"      Thursday night- 2/6- hives of legs 2. LOCATION: "Where is the rash located?"      Chest,shoulders, legs, arms, back,neck 3. NUMBER: "How many hives are there?"      multiple 4. SIZE: "How big are the hives?" (inches, cm, compare to coins) "Do they all look the same or is there lots of variation in shape and size?"      Thighs- big and whelped- small bumps vary 5. ONSET: "When did the hives begin?" (Hours or days ago)      Thursday 6. ITCHING: "Does it itch?" If Yes, ask: "How bad is the itch?"    - MILD: doesn't interfere with normal activities   - MODERATE-SEVERE: interferes with work, school, sleep, or other activities      Yes- intense 7. RECURRENT PROBLEM: "Have you had hives before?" If Yes, ask: "When was the last time?" and "What happened that time?"      no 8. TRIGGERS: "Were you exposed to any new food, plant, cosmetic product or animal just before the hives began?"     Only change- sent beads in laundry 9. OTHER SYMPTOMS: "Do you have any other symptoms?" (e.g., fever, tongue swelling, difficulty breathing, abdomen pain)     Cough- but not bad  Protocols used: Hives-A-AH

## 2023-09-02 NOTE — Patient Instructions (Addendum)
Recommend taking loratadine and famotidine this am when you get it and take them both again this evening. Tomorrow take the famotidine twice a day and start taking the loratadine once daily at bedtime. If you have severe itching you can take a dose of atarax or hydroxyzine   If the rash continues to flare up or spread over the next couple days and it does not improve, then you should start the steroid taper and start it in the am with food in your stomach.

## 2023-10-27 ENCOUNTER — Emergency Department
Admission: EM | Admit: 2023-10-27 | Discharge: 2023-10-27 | Disposition: A | Discharge status: Home / Self Care | Attending: Emergency Medicine | Admitting: Emergency Medicine

## 2023-10-27 ENCOUNTER — Other Ambulatory Visit: Payer: Self-pay

## 2023-10-27 DIAGNOSIS — K529 Noninfective gastroenteritis and colitis, unspecified: Secondary | ICD-10-CM | POA: Diagnosis not present

## 2023-10-27 DIAGNOSIS — L309 Dermatitis, unspecified: Secondary | ICD-10-CM

## 2023-10-27 DIAGNOSIS — R197 Diarrhea, unspecified: Secondary | ICD-10-CM | POA: Diagnosis present

## 2023-10-27 LAB — COMPREHENSIVE METABOLIC PANEL WITH GFR
ALT: 23 U/L (ref 0–44)
AST: 21 U/L (ref 15–41)
Albumin: 4 g/dL (ref 3.5–5.0)
Alkaline Phosphatase: 76 U/L (ref 38–126)
Anion gap: 9 (ref 5–15)
BUN: 18 mg/dL (ref 6–20)
CO2: 23 mmol/L (ref 22–32)
Calcium: 9.1 mg/dL (ref 8.9–10.3)
Chloride: 107 mmol/L (ref 98–111)
Creatinine, Ser: 0.83 mg/dL (ref 0.44–1.00)
GFR, Estimated: >60 mL/min (ref >60)
Glucose, Bld: 93 mg/dL (ref 70–99)
Potassium: 3.9 mmol/L (ref 3.5–5.1)
Sodium: 139 mmol/L (ref 135–145)
Total Bilirubin: 0.7 mg/dL (ref 0.0–1.2)
Total Protein: 7.5 g/dL (ref 6.5–8.1)

## 2023-10-27 LAB — CBC
HCT: 39.5 % (ref 36.0–46.0)
Hemoglobin: 12.6 g/dL (ref 12.0–15.0)
MCH: 26.3 pg (ref 26.0–34.0)
MCHC: 31.9 g/dL (ref 30.0–36.0)
MCV: 82.3 fL (ref 80.0–100.0)
Platelets: 306 10*3/uL (ref 150–400)
RBC: 4.8 MIL/uL (ref 3.87–5.11)
RDW: 14.4 % (ref 11.5–15.5)
WBC: 5.7 10*3/uL (ref 4.0–10.5)
nRBC: 0 % (ref 0.0–0.2)

## 2023-10-27 MED ORDER — ONDANSETRON 4 MG PO TBDP: 4.0000 mg | ORAL_TABLET | Freq: Three times a day (TID) | ORAL | 0 refills | Status: DC | PRN

## 2023-10-27 MED ORDER — LOPERAMIDE HCL 2 MG PO TABS: 2.0000 mg | ORAL_TABLET | Freq: Four times a day (QID) | ORAL | 0 refills | Status: AC | PRN

## 2023-10-27 NOTE — Discharge Instructions (Signed)
Take the prescription meds as directed.  °

## 2023-10-27 NOTE — ED Triage Notes (Signed)
 Pt presents to the ED POV from home for diarrhea, intermittent fever, and nausea for a couple of days. Pt states that she has been able to keep down water and Pedialyte. Pt evaluated in triage by Albright, PA.   Temp 98.3. No antipyretics taken today

## 2023-10-27 NOTE — ED Provider Notes (Signed)
 [facility] Emergency Department Provider Note     Event Date/Time   First MD Initiated Contact with Patient 10/27/23 1136     (approximate)   History   Diarrhea and Fever   HPI  Tiffany Guzman is a 28 y.o. female presents the ED for evaluation of diarrhea, low-grade fevers, and nausea for the last few days.  Patient has been able to continue with p.o. intake of water and Pedialyte.  She denies any intractable nausea vomiting.  She reports loose stools and some irritation.  No frank blood or melanotic stools reported.  She can take ibuprofen and Tylenol for subjective fevers.  She denies any sick contacts, bad food, recent exposure.  She denies any chest pain, abdominal pain, or pelvic pain.  Physical Exam   Triage Vital Signs: ED Triage Vitals  Encounter Vitals Group     BP 10/27/23 1133 126/89     Systolic BP Percentile --      Diastolic BP Percentile --      Pulse Rate 10/27/23 1133 83     Resp 10/27/23 1133 18     Temp 10/27/23 1133 98.3 F (36.8 C)     Temp Source 10/27/23 1133 Oral     SpO2 10/27/23 1133 100 %     Weight 10/27/23 1136 145 lb (65.8 kg)     Height 10/27/23 1136 5\' 4"  (1.626 m)     Head Circumference --      Peak Flow --      Pain Score 10/27/23 1136 0     Pain Loc --      Pain Education --      Exclude from Growth Chart --     Most recent vital signs: Vitals:   10/27/23 1133  BP: 126/89  Pulse: 83  Resp: 18  Temp: 98.3 F (36.8 C)  SpO2: 100%    General Awake, no distress.  NAD HEENT NCAT. PERRL. EOMI. No rhinorrhea. Mucous membranes are moist.  CV:  Good peripheral perfusion. RRR RESP:  Normal effort. CTA ABD:  No distention.  Soft and nontender  ED Results / Procedures / Treatments   Labs (all labs ordered are listed, but only abnormal results are displayed) Labs Reviewed  CBC  COMPREHENSIVE METABOLIC PANEL WITH GFR     EKG   RADIOLOGY  No results found.   PROCEDURES:  Critical Care  performed: No  Procedures   MEDICATIONS ORDERED IN ED: Medications - No data to display   IMPRESSION / MDM / ASSESSMENT AND PLAN / ED COURSE  I reviewed the triage vital signs and the nursing notes.                              Differential diagnosis includes, but is not limited to,  ovarian cyst, ovarian torsion, acute appendicitis, diverticulitis, urinary tract infection/pyelonephritis, bowel obstruction, colitis, renal colic, gastroenteritis, hernia, fibroids, endometriosis, pregnancy related pain including ectopic pregnancy, etc.  Patient's presentation is most consistent with acute presentation with potential threat to life or bodily function.  Patient's diagnosis is consistent with viral gastroenteritis. Patient with a reassuring exam and work-up. No acute lab abnormalities. No evidence of dehydration, intractable NV, or toxic appearance. Patient will be discharged home with prescriptions for Zofran and Famotidine. Patient is to follow up with the PCP as needed or otherwise directed. Patient is given ED precautions to return to the ED for any worsening or  new symptoms.   FINAL CLINICAL IMPRESSION(S) / ED DIAGNOSES   Final diagnoses:  Gastroenteritis     Rx / DC Orders   ED Discharge Orders          Ordered    ondansetron (ZOFRAN-ODT) 4 MG disintegrating tablet  Every 8 hours PRN        10/27/23 1141    loperamide (IMODIUM A-D) 2 MG tablet  4 times daily PRN        10/27/23 1141             Note:  This document was prepared using Dragon voice recognition software and may include unintentional dictation errors.    Lissa Hoard, PA-C 10/27/23 1942    Jene Every, MD 10/28/23 [phone]

## 2024-01-07 ENCOUNTER — Telehealth: Payer: Self-pay

## 2024-01-07 NOTE — Telephone Encounter (Signed)
 Copied from CRM [id number]. Topic: General - Other >> Jan 07, 2024  3:36 PM Emylou G wrote: Reason for CRM: Patient called.. stopped taking antibiotics now has yeast infection .Aaron Aas Wants to know if you can prescribe a medication for it?  She doesn't want a cream

## 2024-01-08 ENCOUNTER — Ambulatory Visit: Admitting: Family Medicine

## 2024-03-19 ENCOUNTER — Encounter: Payer: Self-pay | Admitting: Family Medicine

## 2024-03-19 ENCOUNTER — Ambulatory Visit (INDEPENDENT_AMBULATORY_CARE_PROVIDER_SITE_OTHER): Admitting: Family Medicine

## 2024-03-19 ENCOUNTER — Ambulatory Visit: Payer: Self-pay

## 2024-03-19 VITALS — BP 126/72 | HR 86 | Resp 16 | Ht 64.0 in | Wt 151.9 lb

## 2024-03-19 DIAGNOSIS — Z8759 Personal history of other complications of pregnancy, childbirth and the puerperium: Secondary | ICD-10-CM

## 2024-03-19 DIAGNOSIS — R319 Hematuria, unspecified: Secondary | ICD-10-CM | POA: Diagnosis not present

## 2024-03-19 DIAGNOSIS — M545 Low back pain, unspecified: Secondary | ICD-10-CM

## 2024-03-19 LAB — POCT URINALYSIS DIPSTICK
Bilirubin, UA: NEGATIVE
Glucose, UA: NEGATIVE
Ketones, UA: NEGATIVE
Nitrite, UA: NEGATIVE
Protein, UA: NEGATIVE
Spec Grav, UA: 1.02 (ref 1.010–1.025)
Urobilinogen, UA: 0.2 U/dL
pH, UA: 5 (ref 5.0–8.0)

## 2024-03-19 MED ORDER — LIDOCAINE 5 % EX PTCH: 2.0000 | MEDICATED_PATCH | CUTANEOUS | 0 refills | Status: AC

## 2024-03-19 MED ORDER — IBUPROFEN 600 MG PO TABS: 600.0000 mg | ORAL_TABLET | Freq: Three times a day (TID) | ORAL | 0 refills | Status: AC | PRN
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
## 2024-03-19 NOTE — Telephone Encounter (Signed)
 FYI Only or Action Required?: FYI only for provider.  Patient was last seen in primary care on 08/20/2021 by Glenard Mire, MD.  Called Nurse Triage reporting Flank Pain.  Symptoms began 4 days ago.  Interventions attempted: Nothing.  Symptoms are: unchanged.  Triage Disposition: See Physician Within 24 Hours  Patient/caregiver understands and will follow disposition?:    Copied from CRM #[id number]. Topic: Clinical - Red Word Triage >> Mar 19, 2024 10:13 AM DeAngela L wrote: Red Word that prompted transfer to Nurse Triage: patient having kidney pain on both side and this has been a consistent pain for the last 4 days the pain is definitely there majority of the time   Pt num [phone] (M) Reason for Disposition  MODERATE pain (e.g., interferes with normal activities or awakens from sleep)  Answer Assessment - Initial Assessment Questions Assumes it's kidneys. Shoots up the back  1. LOCATION: Where does it hurt? (e.g., left, right)     Both sides, lower back and side.   2. ONSET: When did the pain start?     4 days ago  3. SEVERITY: How bad is the pain? (e.g., Scale 1-10; mild, moderate, or severe)     Sometimes a 9 or 10. Relaxing is a 4 or 5 4. PATTERN: Does the pain come and go, or is it constant?      Pretty constant   5. CAUSE: What do you think is causing the pain?     Kidneys   6. OTHER SYMPTOMS:  Do you have any other symptoms? (e.g., fever, abdomen pain, vomiting, leg weakness, burning with urination, blood in urine)     No  Protocols used: Flank Pain-A-AH

## 2024-03-20 LAB — URINE CULTURE
MICRO NUMBER:: 16903198
Result:: NO GROWTH
SPECIMEN QUALITY:: ADEQUATE

## 2024-03-23 ENCOUNTER — Ambulatory Visit: Payer: Self-pay | Admitting: Family Medicine

## 2024-03-23 ENCOUNTER — Telehealth: Payer: Self-pay | Admitting: Pharmacy Technician

## 2024-03-23 ENCOUNTER — Other Ambulatory Visit (HOSPITAL_COMMUNITY): Payer: Self-pay

## 2024-03-23 NOTE — Telephone Encounter (Signed)
 Pharmacy Patient Advocate Encounter   Received notification from CoverMyMeds that prior authorization for Lidocaine  5% patches  is required/requested.   Insurance verification completed.   The patient is insured through [employer] .   Per test claim: PA required and submitted KEY/EOC/Request #: [id number] from 03/23/24 to 03/23/25. Ran test claim, Copay is $4.00. This test claim was processed through [facility]- copay amounts may vary at other pharmacies due to pharmacy/plan contracts, or as the patient moves through the different stages of their insurance plan.

## 2024-03-24 ENCOUNTER — Other Ambulatory Visit (HOSPITAL_COMMUNITY): Payer: Self-pay

## 2024-05-13 ENCOUNTER — Other Ambulatory Visit: Payer: Self-pay

## 2024-05-13 ENCOUNTER — Emergency Department
Admission: EM | Admit: 2024-05-13 | Discharge: 2024-05-13 | Disposition: A | Discharge status: Home / Self Care | Attending: Emergency Medicine | Admitting: Emergency Medicine

## 2024-05-13 ENCOUNTER — Encounter: Payer: Self-pay | Admitting: Emergency Medicine

## 2024-05-13 DIAGNOSIS — E876 Hypokalemia: Secondary | ICD-10-CM | POA: Diagnosis not present

## 2024-05-13 DIAGNOSIS — R1116 Cannabis hyperemesis syndrome: Secondary | ICD-10-CM | POA: Insufficient documentation

## 2024-05-13 DIAGNOSIS — R197 Diarrhea, unspecified: Secondary | ICD-10-CM | POA: Insufficient documentation

## 2024-05-13 DIAGNOSIS — M791 Myalgia, unspecified site: Secondary | ICD-10-CM | POA: Diagnosis not present

## 2024-05-13 LAB — COMPREHENSIVE METABOLIC PANEL WITH GFR
ALT: 12 U/L (ref 0–44)
AST: 17 U/L (ref 15–41)
Albumin: 4.7 g/dL (ref 3.5–5.0)
Alkaline Phosphatase: 71 U/L (ref 38–126)
Anion gap: 12 (ref 5–15)
BUN: 15 mg/dL (ref 6–20)
CO2: 20 mmol/L — ABNORMAL LOW (ref 22–32)
Calcium: 9.6 mg/dL (ref 8.9–10.3)
Chloride: 107 mmol/L (ref 98–111)
Creatinine, Ser: 0.86 mg/dL (ref 0.44–1.00)
GFR, Estimated: >60 mL/min (ref >60)
Glucose, Bld: 125 mg/dL — ABNORMAL HIGH (ref 70–99)
Potassium: 2.9 mmol/L — ABNORMAL LOW (ref 3.5–5.1)
Sodium: 139 mmol/L (ref 135–145)
Total Bilirubin: 0.8 mg/dL (ref 0.0–1.2)
Total Protein: 8.8 g/dL — ABNORMAL HIGH (ref 6.5–8.1)

## 2024-05-13 LAB — CBC
HCT: 41.3 % (ref 36.0–46.0)
Hemoglobin: 13.4 g/dL (ref 12.0–15.0)
MCH: 25.6 pg — ABNORMAL LOW (ref 26.0–34.0)
MCHC: 32.4 g/dL (ref 30.0–36.0)
MCV: 78.8 fL — ABNORMAL LOW (ref 80.0–100.0)
Platelets: 392 K/uL (ref 150–400)
RBC: 5.24 MIL/uL — ABNORMAL HIGH (ref 3.87–5.11)
RDW: 13.7 % (ref 11.5–15.5)
WBC: 10.3 K/uL (ref 4.0–10.5)
nRBC: 0 % (ref 0.0–0.2)

## 2024-05-13 LAB — LIPASE, BLOOD: Lipase: 30 U/L (ref 11–51)

## 2024-05-13 LAB — MAGNESIUM: Magnesium: 2 mg/dL (ref 1.7–2.4)

## 2024-05-13 MED ORDER — SODIUM CHLORIDE 0.9 % IV BOLUS
1000.0000 mL | Freq: Once | INTRAVENOUS | Status: AC
  Administered 2024-05-13: 1000 mL via INTRAVENOUS

## 2024-05-13 MED ORDER — POTASSIUM CHLORIDE CRYS ER 20 MEQ PO TBCR
40.0000 meq | EXTENDED_RELEASE_TABLET | Freq: Once | ORAL | Status: AC
  Administered 2024-05-13: 40 meq via ORAL
  Filled 2024-05-13: qty 2

## 2024-05-13 MED ORDER — KETOROLAC TROMETHAMINE 15 MG/ML IJ SOLN
15.0000 mg | Freq: Once | INTRAMUSCULAR | Status: AC
  Administered 2024-05-13: 15 mg via INTRAVENOUS
  Filled 2024-05-13: qty 1

## 2024-05-13 MED ORDER — ONDANSETRON 4 MG PO TBDP: 4.0000 mg | ORAL_TABLET | Freq: Three times a day (TID) | ORAL | 0 refills | Status: AC | PRN

## 2024-05-13 MED ORDER — PROCHLORPERAZINE EDISYLATE 10 MG/2ML IJ SOLN
10.0000 mg | Freq: Once | INTRAMUSCULAR | Status: AC
  Administered 2024-05-13: 10 mg via INTRAVENOUS
  Filled 2024-05-13: qty 2

## 2024-05-13 MED ORDER — DROPERIDOL 2.5 MG/ML IJ SOLN
1.2500 mg | Freq: Once | INTRAMUSCULAR | Status: AC
  Administered 2024-05-13: 1.25 mg via INTRAVENOUS
  Filled 2024-05-13: qty 2

## 2024-05-13 NOTE — ED Triage Notes (Addendum)
 Pt states that at 0330 this am she started with n/v/d hot and cold flashes, all over body aches and states that no one else is sick. Pt vomiting in triage. Pt does state her last marijuana use was yesterday afternoon

## 2024-05-13 NOTE — ED Provider Notes (Signed)
 [facility] Provider Note    Event Date/Time   First MD Initiated Contact with Patient 05/13/24 [phone]     (approximate)   History   Emesis, Diarrhea, and Generalized Body Aches   HPI  Tiffany Guzman is a 28 y.o. female with PMH of cannabis abuse and cannabis hyperemesis, migraine, IBS presents for evaluation of vomiting, diarrhea and generalized bodyaches.  Patient denies fever and urinary symptoms.  States that she did smoke marijuana last night.      Physical Exam   Triage Vital Signs: ED Triage Vitals  Encounter Vitals Group     BP 05/13/24 0838 (!) 142/91     Girls Systolic BP Percentile --      Girls Diastolic BP Percentile --      Boys Systolic BP Percentile --      Boys Diastolic BP Percentile --      Pulse Rate 05/13/24 0838 98     Resp 05/13/24 0838 20     Temp 05/13/24 0838 98.5 F (36.9 C)     Temp Source 05/13/24 0838 Axillary     SpO2 05/13/24 0838 99 %     Weight 05/13/24 0840 150 lb (68 kg)     Height 05/13/24 0840 5' 4 (1.626 m)     Head Circumference --      Peak Flow --      Pain Score 05/13/24 0839 10     Pain Loc --      Pain Education --      Exclude from Growth Chart --     Most recent vital signs: Vitals:   05/13/24 0838  BP: (!) 142/91  Pulse: 98  Resp: 20  Temp: 98.5 F (36.9 C)  SpO2: 99%   General: Awake, crying and moaning in pain. CV:  Good peripheral perfusion.  RRR. Resp:  Normal effort.  CTAB. Abd:  No distention.  Soft, generalized tenderness to palpation. Other:     ED Results / Procedures / Treatments   Labs (all labs ordered are listed, but only abnormal results are displayed) Labs Reviewed  COMPREHENSIVE METABOLIC PANEL WITH GFR - Abnormal; Notable for the following components:      Result Value   Potassium 2.9 (*)    CO2 20 (*)    Glucose, Bld 125 (*)    Total Protein 8.8 (*)    All other components within normal limits  CBC - Abnormal; Notable for the following components:    RBC 5.24 (*)    MCV 78.8 (*)    MCH 25.6 (*)    All other components within normal limits  LIPASE, BLOOD  MAGNESIUM   URINALYSIS, ROUTINE W REFLEX MICROSCOPIC  POC URINE PREG, ED    PROCEDURES:  Critical Care performed: No  Procedures   MEDICATIONS ORDERED IN ED: Medications  sodium chloride  0.9 % bolus 1,000 mL (1,000 mLs Intravenous New Bag/Given 05/13/24 0913)  droperidol  (INAPSINE ) 2.5 MG/ML injection 1.25 mg (1.25 mg Intravenous Given 05/13/24 0906)  ketorolac  (TORADOL ) 15 MG/ML injection 15 mg (15 mg Intravenous Given 05/13/24 0907)  potassium chloride  SA (KLOR-CON  M) CR tablet 40 mEq (40 mEq Oral Given 05/13/24 0957)  prochlorperazine  (COMPAZINE ) injection 10 mg (10 mg Intravenous Given 05/13/24 1149)     IMPRESSION / MDM / ASSESSMENT AND PLAN / ED COURSE  I reviewed the triage vital signs and the nursing notes.  28 year old female presents for evaluation of vomiting, diarrhea and bodyaches.  Blood pressure is a little bit elevated although I do suspect this is secondary to pain as patient is very uncomfortable, crying and moaning in the room.  Vital signs are stable otherwise.  Differential diagnosis includes, but is not limited to, gastroenteritis, pancreatitis, diverticulitis, IBS, cannabis hyperemesis, biliary disease.  Patient's presentation is most consistent with acute complicated illness / injury requiring diagnostic workup.  Will obtain labs and plan to give patient IV fluids, nausea medication and pain medication.  Patient reports that her symptoms are similar to previous episodes of cannabis hyperemesis and did recently use marijuana.  If labs are reassuring and patient's pain improves we will plan to hold off on imaging.  Clinical Course as of 05/13/24 1309  Thu May 13, 2024  0920 CBC(!) Unremarkable. No leukocytosis. [LD]  [id number] Comprehensive metabolic panel(!) Hypokalemia, will check magnesium  and replete potassium.  Will  also check EKG to evaluate patient's QT interval. [LD]  1106 Magnesium  Within normal limits. [LD]  1125 ED EKG No QT prolongation, normal sinus rhythm. [LD]  1140 Reassessed and states that she still feels nauseous will order some more nausea medication. [LD]  1307 Patient reassessed and reports she is feeling much better.  I explained that I was waiting for her to give a urine sample so we could check for UTI and assess her pregnancy.  Patient did not think that she could not pee and she is not concerned about UTI.  She does not want further testing and states she is ready to go home. [LD]    Clinical Course User Index [LD] Cleaster Tinnie LABOR, PA-C     FINAL CLINICAL IMPRESSION(S) / ED DIAGNOSES   Final diagnoses:  Cannabis hyperemesis syndrome     Rx / DC Orders   ED Discharge Orders          Ordered    ondansetron  (ZOFRAN -ODT) 4 MG disintegrating tablet  Every 8 hours PRN        05/13/24 1304             Note:  This document was prepared using Dragon voice recognition software and may include unintentional dictation errors.   Cleaster Tinnie LABOR, PA-C 05/13/24 1310    Dicky Anes, MD 05/13/24 [phone]

## 2024-05-13 NOTE — Discharge Instructions (Addendum)
 I believe your symptoms today were brought on by marijuana use or may be due to a viral infection. I recommend not using marijuana.  I have sent some nausea medication to your pharmacy.  You can pick this up and take it as needed.  I recommend taking both Tylenol  and ibuprofen  as needed for abdominal pain.  Please make sure you stay well-hydrated and eat small bland meals until you feel better.

## 2024-05-13 NOTE — ED Notes (Signed)
 Patient states while nausea and vomiting have improved, she has still vomited a couple of times.

## 2024-05-13 NOTE — ED Notes (Signed)
 Patient c/o continued nausea and vomiting.

## 2024-05-13 NOTE — ED Notes (Signed)
 Patient states nausea has improved

## 2024-05-15 ENCOUNTER — Emergency Department
Admission: EM | Admit: 2024-05-15 | Discharge: 2024-05-15 | Disposition: A | Discharge status: Home / Self Care | Attending: Emergency Medicine | Admitting: Emergency Medicine

## 2024-05-15 ENCOUNTER — Other Ambulatory Visit: Payer: Self-pay

## 2024-05-15 DIAGNOSIS — R112 Nausea with vomiting, unspecified: Secondary | ICD-10-CM | POA: Insufficient documentation

## 2024-05-15 DIAGNOSIS — R1084 Generalized abdominal pain: Secondary | ICD-10-CM | POA: Insufficient documentation

## 2024-05-15 DIAGNOSIS — D72829 Elevated white blood cell count, unspecified: Secondary | ICD-10-CM | POA: Insufficient documentation

## 2024-05-15 LAB — COMPREHENSIVE METABOLIC PANEL WITH GFR
ALT: 16 U/L (ref 0–44)
AST: 26 U/L (ref 15–41)
Albumin: 4.8 g/dL (ref 3.5–5.0)
Alkaline Phosphatase: 71 U/L (ref 38–126)
Anion gap: 19 — ABNORMAL HIGH (ref 5–15)
BUN: 24 mg/dL — ABNORMAL HIGH (ref 6–20)
CO2: 17 mmol/L — ABNORMAL LOW (ref 22–32)
Calcium: 9.5 mg/dL (ref 8.9–10.3)
Chloride: 101 mmol/L (ref 98–111)
Creatinine, Ser: 0.89 mg/dL (ref 0.44–1.00)
GFR, Estimated: >60 mL/min (ref >60)
Glucose, Bld: 106 mg/dL — ABNORMAL HIGH (ref 70–99)
Potassium: 3.4 mmol/L — ABNORMAL LOW (ref 3.5–5.1)
Sodium: 137 mmol/L (ref 135–145)
Total Bilirubin: 1.4 mg/dL — ABNORMAL HIGH (ref 0.0–1.2)
Total Protein: 9.2 g/dL — ABNORMAL HIGH (ref 6.5–8.1)

## 2024-05-15 LAB — CBC
HCT: 39.7 % (ref 36.0–46.0)
Hemoglobin: 13 g/dL (ref 12.0–15.0)
MCH: 25.6 pg — ABNORMAL LOW (ref 26.0–34.0)
MCHC: 32.7 g/dL (ref 30.0–36.0)
MCV: 78.3 fL — ABNORMAL LOW (ref 80.0–100.0)
Platelets: 436 K/uL — ABNORMAL HIGH (ref 150–400)
RBC: 5.07 MIL/uL (ref 3.87–5.11)
RDW: 14.4 % (ref 11.5–15.5)
WBC: 16.1 K/uL — ABNORMAL HIGH (ref 4.0–10.5)
nRBC: 0 % (ref 0.0–0.2)

## 2024-05-15 MED ORDER — SODIUM CHLORIDE 0.9 % IV BOLUS
1000.0000 mL | Freq: Once | INTRAVENOUS | Status: AC
  Administered 2024-05-15: 1000 mL via INTRAVENOUS

## 2024-05-15 MED ORDER — ONDANSETRON HCL 4 MG/2ML IJ SOLN
4.0000 mg | Freq: Once | INTRAMUSCULAR | Status: AC
  Administered 2024-05-15: 4 mg via INTRAVENOUS
  Filled 2024-05-15: qty 2

## 2024-05-15 MED ORDER — DROPERIDOL 2.5 MG/ML IJ SOLN: 2.5000 mg | Freq: Once | INTRAMUSCULAR | Status: DC

## 2024-05-15 MED ORDER — LORAZEPAM 2 MG/ML IJ SOLN
1.0000 mg | Freq: Once | INTRAMUSCULAR | Status: AC
  Administered 2024-05-15: 1 mg via INTRAVENOUS
  Filled 2024-05-15: qty 1

## 2024-05-15 MED ORDER — MORPHINE SULFATE (PF) 4 MG/ML IV SOLN
4.0000 mg | Freq: Once | INTRAVENOUS | Status: AC
  Administered 2024-05-15: 4 mg via INTRAVENOUS
  Filled 2024-05-15: qty 1

## 2024-05-15 MED ORDER — METOCLOPRAMIDE HCL 10 MG PO TABS: 10.0000 mg | ORAL_TABLET | Freq: Three times a day (TID) | ORAL | 0 refills | Status: AC | PRN

## 2024-05-15 MED ORDER — SODIUM CHLORIDE 0.9 % IV SOLN
25.0000 mg | Freq: Once | INTRAVENOUS | Status: AC
  Administered 2024-05-15: 25 mg via INTRAVENOUS
  Filled 2024-05-15: qty 25

## 2024-05-15 MED ORDER — METOCLOPRAMIDE HCL 10 MG PO TABS
10.0000 mg | ORAL_TABLET | Freq: Three times a day (TID) | ORAL | 0 refills | Status: DC | PRN
Start: 2024-05-15 — End: 2024-05-15

## 2024-05-15 NOTE — ED Triage Notes (Addendum)
 PT arrives via EMS from home. PT c/o ongoing abdominal pain, nausea, vomiting, and constipation since Thursday. Pt was given 100mcg of Fentanyl  and 1000mg  of tylenol  by ems with no relief. Pt is AxOx4.

## 2024-05-15 NOTE — ED Provider Notes (Signed)
 [facility] Provider Note    Event Date/Time   First MD Initiated Contact with Patient 05/15/24 0935     (approximate)  History   Chief Complaint: Abdominal Pain, Nausea, and Vomiting  HPI  Tiffany Guzman is a 28 y.o. female medical history of anxiety, gastric reflux, cannabinoid hyperemesis syndrome, presents to the emergency department for continued nausea vomiting and abdominal cramping.  According to the patient she was seen here on Wednesday after using marijuana products for nausea vomiting and abdominal cramping.  Patient states since going home Wednesday she has continued to have symptoms.  States she continues to be nauseated with frequent episodes of vomiting and feels dehydrated.  States diffuse abdominal cramping but denies any focal abdominal pain.  Denies any diarrhea or urinary symptoms.  Last menstrual period ended 5 days ago.  Physical Exam   Triage Vital Signs: ED Triage Vitals  Encounter Vitals Group     BP 05/15/24 0930 (!) 153/102     Girls Systolic BP Percentile --      Girls Diastolic BP Percentile --      Boys Systolic BP Percentile --      Boys Diastolic BP Percentile --      Pulse Rate 05/15/24 0930 87     Resp 05/15/24 0930 19     Temp 05/15/24 0930 97.7 F (36.5 C)     Temp Source 05/15/24 0930 Oral     SpO2 05/15/24 0930 100 %     Weight --      Height --      Head Circumference --      Peak Flow --      Pain Score 05/15/24 0931 10     Pain Loc --      Pain Education --      Exclude from Growth Chart --     Most recent vital signs: Vitals:   05/15/24 0930  BP: (!) 153/102  Pulse: 87  Resp: 19  Temp: 97.7 F (36.5 C)  SpO2: 100%    General: Awake, mild distress, groaning in discomfort but answers questions appropriately and follows commands. CV:  Good peripheral perfusion.  Regular rate and rhythm  Resp:  Normal effort.  Equal breath sounds bilaterally.  Abd:  No distention.  Soft, mild diffuse  tenderness.  No rebound or guarding.  ED Results / Procedures / Treatments   MEDICATIONS ORDERED IN ED: Medications  sodium chloride  0.9 % bolus 1,000 mL (has no administration in time range)     IMPRESSION / MDM / ASSESSMENT AND PLAN / ED COURSE  I reviewed the triage vital signs and the nursing notes.  Patient's presentation is most consistent with acute presentation with potential threat to life or bodily function.  Patient presents emergency department for nausea vomiting and abdominal cramping that started Wednesday after using marijuana products.  Patient states she has a previous diagnosis of cannabinoid hyperemesis syndrome.  States she has not used any marijuana products since Wednesday but the nausea vomiting has been fairly constant.  We will check labs we will obtain an EKG to evaluate QTc.  As long as the patient's QTc is within normal limits we will dose droperidol  in addition to IV fluids to see if we can stop the patient's nausea and vomiting.  Patient is agreeable to this plan.  Patient's lab work shows a mild leukocytosis.  She does have signs of dehydration with anion gap of 19.  Patient has now received 3  L of fluids.  She states she is feeling much better and is ready to go home.  We will discharge with Reglan  discussed significant oral hydration at home as well as return precautions.  Patient is agreeable to this plan.  FINAL CLINICAL IMPRESSION(S) / ED DIAGNOSES   Nausea and vomiting   Note:  This document was prepared using Dragon voice recognition software and may include unintentional dictation errors.   Dorothyann Drivers, MD 05/15/24 1422

## 2024-05-15 NOTE — Discharge Instructions (Signed)
 Please take your nausea medication every 8 hours as needed for nausea.  Please drink plenty of fluids.  You may use over-the-counter Maalox as needed for heartburn/reflux symptoms.  Return to the emergency department for any symptom personally concerning to yourself.

## 2024-05-15 NOTE — ED Notes (Signed)
 Pt given warm blanket.
